# Patient Record
Sex: Female | Born: 1965
Health system: Southern US, Community
[De-identification: ages and names within clinical notes are randomized; demographics above are authoritative.]

## PROBLEM LIST (undated history)

## (undated) DIAGNOSIS — I10 Essential (primary) hypertension: Secondary | ICD-10-CM

## (undated) DIAGNOSIS — E785 Hyperlipidemia, unspecified: Secondary | ICD-10-CM

## (undated) DIAGNOSIS — R7301 Impaired fasting glucose: Secondary | ICD-10-CM

## (undated) DIAGNOSIS — F17200 Nicotine dependence, unspecified, uncomplicated: Secondary | ICD-10-CM

## (undated) DIAGNOSIS — D219 Benign neoplasm of connective and other soft tissue, unspecified: Secondary | ICD-10-CM

## (undated) DIAGNOSIS — Z803 Family history of malignant neoplasm of breast: Secondary | ICD-10-CM

## (undated) DIAGNOSIS — G43909 Migraine, unspecified, not intractable, without status migrainosus: Secondary | ICD-10-CM

## (undated) DIAGNOSIS — G5601 Carpal tunnel syndrome, right upper limb: Secondary | ICD-10-CM

## (undated) DIAGNOSIS — E669 Obesity, unspecified: Secondary | ICD-10-CM

## (undated) DIAGNOSIS — A6000 Herpesviral infection of urogenital system, unspecified: Secondary | ICD-10-CM

## (undated) DIAGNOSIS — K219 Gastro-esophageal reflux disease without esophagitis: Secondary | ICD-10-CM

## (undated) HISTORY — PX: LAPAROSCOPIC ABDOMINAL EXPLORATION: SHX6249

## (undated) HISTORY — PX: CARDIAC CATHETERIZATION: SHX172

## (undated) HISTORY — DX: Impaired fasting glucose: R73.01

## (undated) HISTORY — PX: CYST EXCISION: SHX5701

## (undated) HISTORY — DX: Obesity, unspecified: E66.9

## (undated) HISTORY — PX: DILATION AND CURETTAGE OF UTERUS: SHX78

## (undated) HISTORY — PX: TUBAL LIGATION: SHX77

## (undated) HISTORY — DX: Nicotine dependence, unspecified, uncomplicated: F17.200

## (undated) HISTORY — DX: Family history of malignant neoplasm of breast: Z80.3

## (undated) HISTORY — DX: Herpesviral infection of urogenital system, unspecified: A60.00

---

## 1998-05-05 ENCOUNTER — Emergency Department (HOSPITAL_COMMUNITY): Admission: EM | Admit: 1998-05-05 | Discharge: 1998-05-05 | Payer: Self-pay | Admitting: Emergency Medicine

## 1999-02-21 ENCOUNTER — Ambulatory Visit (HOSPITAL_COMMUNITY): Admission: RE | Admit: 1999-02-21 | Discharge: 1999-02-21 | Payer: Self-pay | Admitting: Internal Medicine

## 1999-02-21 ENCOUNTER — Encounter: Payer: Self-pay | Admitting: Internal Medicine

## 1999-05-17 ENCOUNTER — Emergency Department (HOSPITAL_COMMUNITY): Admission: EM | Admit: 1999-05-17 | Discharge: 1999-05-17 | Payer: Self-pay | Admitting: Emergency Medicine

## 1999-11-12 ENCOUNTER — Other Ambulatory Visit: Admission: RE | Admit: 1999-11-12 | Discharge: 1999-11-12 | Payer: Self-pay | Admitting: Internal Medicine

## 2000-01-05 ENCOUNTER — Encounter: Payer: Self-pay | Admitting: Emergency Medicine

## 2000-01-05 ENCOUNTER — Emergency Department (HOSPITAL_COMMUNITY): Admission: EM | Admit: 2000-01-05 | Discharge: 2000-01-05 | Payer: Self-pay | Admitting: Emergency Medicine

## 2000-02-29 ENCOUNTER — Encounter: Payer: Self-pay | Admitting: Family Medicine

## 2000-02-29 ENCOUNTER — Ambulatory Visit (HOSPITAL_COMMUNITY): Admission: RE | Admit: 2000-02-29 | Discharge: 2000-02-29 | Payer: Self-pay | Admitting: Family Medicine

## 2000-07-30 ENCOUNTER — Encounter: Payer: Self-pay | Admitting: Emergency Medicine

## 2000-07-30 ENCOUNTER — Emergency Department (HOSPITAL_COMMUNITY): Admission: EM | Admit: 2000-07-30 | Discharge: 2000-07-30 | Payer: Self-pay | Admitting: Emergency Medicine

## 2000-08-01 ENCOUNTER — Ambulatory Visit: Admission: RE | Admit: 2000-08-01 | Discharge: 2000-08-01 | Payer: Self-pay

## 2001-02-17 ENCOUNTER — Encounter: Payer: Self-pay | Admitting: Emergency Medicine

## 2001-02-17 ENCOUNTER — Emergency Department (HOSPITAL_COMMUNITY): Admission: EM | Admit: 2001-02-17 | Discharge: 2001-02-17 | Payer: Self-pay | Admitting: Emergency Medicine

## 2001-08-03 ENCOUNTER — Other Ambulatory Visit: Admission: RE | Admit: 2001-08-03 | Discharge: 2001-08-03 | Payer: Self-pay | Admitting: Family Medicine

## 2001-08-30 ENCOUNTER — Emergency Department (HOSPITAL_COMMUNITY): Admission: EM | Admit: 2001-08-30 | Discharge: 2001-08-30 | Payer: Self-pay | Admitting: *Deleted

## 2001-10-06 ENCOUNTER — Emergency Department (HOSPITAL_COMMUNITY): Admission: EM | Admit: 2001-10-06 | Discharge: 2001-10-06 | Payer: Self-pay | Admitting: Emergency Medicine

## 2001-10-23 ENCOUNTER — Ambulatory Visit (HOSPITAL_COMMUNITY): Admission: RE | Admit: 2001-10-23 | Discharge: 2001-10-23 | Payer: Self-pay | Admitting: Family Medicine

## 2002-04-15 ENCOUNTER — Encounter: Payer: Self-pay | Admitting: Emergency Medicine

## 2002-04-15 ENCOUNTER — Emergency Department (HOSPITAL_COMMUNITY): Admission: EM | Admit: 2002-04-15 | Discharge: 2002-04-15 | Payer: Self-pay | Admitting: Emergency Medicine

## 2002-11-26 ENCOUNTER — Emergency Department (HOSPITAL_COMMUNITY): Admission: EM | Admit: 2002-11-26 | Discharge: 2002-11-26 | Payer: Self-pay

## 2002-12-10 ENCOUNTER — Emergency Department (HOSPITAL_COMMUNITY): Admission: EM | Admit: 2002-12-10 | Discharge: 2002-12-10 | Payer: Self-pay | Admitting: Emergency Medicine

## 2003-03-21 ENCOUNTER — Encounter: Payer: Self-pay | Admitting: Emergency Medicine

## 2003-03-21 ENCOUNTER — Emergency Department (HOSPITAL_COMMUNITY): Admission: EM | Admit: 2003-03-21 | Discharge: 2003-03-21 | Payer: Self-pay | Admitting: Emergency Medicine

## 2003-11-22 ENCOUNTER — Emergency Department (HOSPITAL_COMMUNITY): Admission: EM | Admit: 2003-11-22 | Discharge: 2003-11-22 | Payer: Self-pay | Admitting: Emergency Medicine

## 2004-04-03 ENCOUNTER — Ambulatory Visit (HOSPITAL_COMMUNITY): Admission: RE | Admit: 2004-04-03 | Discharge: 2004-04-03 | Payer: Self-pay | Admitting: Internal Medicine

## 2004-04-13 ENCOUNTER — Ambulatory Visit (HOSPITAL_COMMUNITY): Admission: RE | Admit: 2004-04-13 | Discharge: 2004-04-13 | Payer: Self-pay | Admitting: Family Medicine

## 2004-04-20 ENCOUNTER — Emergency Department (HOSPITAL_COMMUNITY): Admission: EM | Admit: 2004-04-20 | Discharge: 2004-04-20 | Payer: Self-pay | Admitting: Emergency Medicine

## 2004-06-20 ENCOUNTER — Emergency Department (HOSPITAL_COMMUNITY): Admission: EM | Admit: 2004-06-20 | Discharge: 2004-06-20 | Payer: Self-pay | Admitting: Family Medicine

## 2004-06-21 ENCOUNTER — Emergency Department (HOSPITAL_COMMUNITY): Admission: EM | Admit: 2004-06-21 | Discharge: 2004-06-21 | Payer: Self-pay | Admitting: Emergency Medicine

## 2004-08-03 ENCOUNTER — Ambulatory Visit: Payer: Self-pay | Admitting: *Deleted

## 2004-08-16 ENCOUNTER — Ambulatory Visit: Payer: Self-pay | Admitting: Nurse Practitioner

## 2004-08-30 ENCOUNTER — Ambulatory Visit: Payer: Self-pay | Admitting: Nurse Practitioner

## 2004-09-12 ENCOUNTER — Ambulatory Visit: Payer: Self-pay | Admitting: Nurse Practitioner

## 2004-11-15 ENCOUNTER — Ambulatory Visit: Payer: Self-pay | Admitting: Nurse Practitioner

## 2004-12-27 ENCOUNTER — Ambulatory Visit: Payer: Self-pay | Admitting: Family Medicine

## 2004-12-31 ENCOUNTER — Ambulatory Visit: Payer: Self-pay | Admitting: Nurse Practitioner

## 2005-02-20 ENCOUNTER — Ambulatory Visit: Payer: Self-pay | Admitting: Nurse Practitioner

## 2005-03-14 ENCOUNTER — Emergency Department (HOSPITAL_COMMUNITY): Admission: EM | Admit: 2005-03-14 | Discharge: 2005-03-14 | Payer: Self-pay | Admitting: Emergency Medicine

## 2005-03-15 ENCOUNTER — Emergency Department (HOSPITAL_COMMUNITY): Admission: EM | Admit: 2005-03-15 | Discharge: 2005-03-15 | Payer: Self-pay | Admitting: Emergency Medicine

## 2005-03-18 ENCOUNTER — Ambulatory Visit: Payer: Self-pay | Admitting: Nurse Practitioner

## 2005-04-01 ENCOUNTER — Ambulatory Visit: Payer: Self-pay | Admitting: Nurse Practitioner

## 2005-05-26 ENCOUNTER — Emergency Department (HOSPITAL_COMMUNITY): Admission: EM | Admit: 2005-05-26 | Discharge: 2005-05-26 | Payer: Self-pay | Admitting: Family Medicine

## 2005-06-14 ENCOUNTER — Ambulatory Visit: Payer: Self-pay | Admitting: Nurse Practitioner

## 2005-06-17 ENCOUNTER — Ambulatory Visit: Payer: Self-pay | Admitting: Internal Medicine

## 2005-07-09 ENCOUNTER — Ambulatory Visit: Payer: Self-pay | Admitting: Nurse Practitioner

## 2005-07-18 ENCOUNTER — Encounter: Admission: RE | Admit: 2005-07-18 | Discharge: 2005-07-18 | Payer: Self-pay | Admitting: Internal Medicine

## 2005-07-22 ENCOUNTER — Emergency Department (HOSPITAL_COMMUNITY): Admission: EM | Admit: 2005-07-22 | Discharge: 2005-07-23 | Payer: Self-pay | Admitting: Emergency Medicine

## 2005-07-23 ENCOUNTER — Ambulatory Visit: Payer: Self-pay | Admitting: Nurse Practitioner

## 2005-07-23 ENCOUNTER — Ambulatory Visit (HOSPITAL_COMMUNITY): Admission: RE | Admit: 2005-07-23 | Discharge: 2005-07-23 | Payer: Self-pay | Admitting: Emergency Medicine

## 2005-07-29 ENCOUNTER — Ambulatory Visit: Payer: Self-pay | Admitting: Nurse Practitioner

## 2005-08-19 ENCOUNTER — Emergency Department (HOSPITAL_COMMUNITY): Admission: EM | Admit: 2005-08-19 | Discharge: 2005-08-19 | Payer: Self-pay | Admitting: Emergency Medicine

## 2005-09-02 ENCOUNTER — Ambulatory Visit: Payer: Self-pay | Admitting: Nurse Practitioner

## 2005-09-08 ENCOUNTER — Emergency Department (HOSPITAL_COMMUNITY): Admission: EM | Admit: 2005-09-08 | Discharge: 2005-09-08 | Payer: Self-pay | Admitting: Family Medicine

## 2005-09-15 ENCOUNTER — Emergency Department (HOSPITAL_COMMUNITY): Admission: EM | Admit: 2005-09-15 | Discharge: 2005-09-15 | Payer: Self-pay | Admitting: Emergency Medicine

## 2005-11-05 ENCOUNTER — Emergency Department (HOSPITAL_COMMUNITY): Admission: EM | Admit: 2005-11-05 | Discharge: 2005-11-05 | Payer: Self-pay | Admitting: Emergency Medicine

## 2005-11-06 ENCOUNTER — Ambulatory Visit: Payer: Self-pay | Admitting: Nurse Practitioner

## 2005-11-08 ENCOUNTER — Ambulatory Visit: Payer: Self-pay | Admitting: Nurse Practitioner

## 2005-11-20 ENCOUNTER — Emergency Department (HOSPITAL_COMMUNITY): Admission: EM | Admit: 2005-11-20 | Discharge: 2005-11-20 | Payer: Self-pay | Admitting: Family Medicine

## 2005-12-18 ENCOUNTER — Ambulatory Visit: Payer: Self-pay | Admitting: Nurse Practitioner

## 2005-12-31 ENCOUNTER — Ambulatory Visit: Payer: Self-pay | Admitting: Nurse Practitioner

## 2006-01-15 ENCOUNTER — Emergency Department (HOSPITAL_COMMUNITY): Admission: EM | Admit: 2006-01-15 | Discharge: 2006-01-15 | Payer: Self-pay | Admitting: Family Medicine

## 2006-02-13 ENCOUNTER — Ambulatory Visit: Payer: Self-pay | Admitting: Nurse Practitioner

## 2006-03-21 ENCOUNTER — Ambulatory Visit: Payer: Self-pay | Admitting: Nurse Practitioner

## 2006-03-25 ENCOUNTER — Ambulatory Visit (HOSPITAL_COMMUNITY): Admission: RE | Admit: 2006-03-25 | Discharge: 2006-03-25 | Payer: Self-pay | Admitting: Internal Medicine

## 2006-03-31 ENCOUNTER — Ambulatory Visit: Payer: Self-pay | Admitting: Nurse Practitioner

## 2006-04-09 ENCOUNTER — Emergency Department (HOSPITAL_COMMUNITY): Admission: EM | Admit: 2006-04-09 | Discharge: 2006-04-09 | Payer: Self-pay | Admitting: Emergency Medicine

## 2006-04-25 ENCOUNTER — Ambulatory Visit: Payer: Self-pay | Admitting: Family Medicine

## 2006-05-14 ENCOUNTER — Ambulatory Visit: Payer: Self-pay | Admitting: Nurse Practitioner

## 2006-05-16 ENCOUNTER — Ambulatory Visit: Payer: Self-pay | Admitting: Nurse Practitioner

## 2006-06-13 ENCOUNTER — Ambulatory Visit (HOSPITAL_BASED_OUTPATIENT_CLINIC_OR_DEPARTMENT_OTHER): Admission: RE | Admit: 2006-06-13 | Discharge: 2006-06-14 | Payer: Self-pay | Admitting: Otolaryngology

## 2006-06-13 ENCOUNTER — Encounter (INDEPENDENT_AMBULATORY_CARE_PROVIDER_SITE_OTHER): Payer: Self-pay | Admitting: *Deleted

## 2006-07-31 ENCOUNTER — Ambulatory Visit: Payer: Self-pay | Admitting: Nurse Practitioner

## 2006-08-08 ENCOUNTER — Ambulatory Visit: Payer: Self-pay | Admitting: Nurse Practitioner

## 2006-08-14 ENCOUNTER — Encounter: Admission: RE | Admit: 2006-08-14 | Discharge: 2006-08-14 | Payer: Self-pay | Admitting: Nurse Practitioner

## 2006-08-18 ENCOUNTER — Ambulatory Visit: Payer: Self-pay | Admitting: Nurse Practitioner

## 2006-10-17 ENCOUNTER — Ambulatory Visit: Payer: Self-pay | Admitting: Nurse Practitioner

## 2006-10-30 ENCOUNTER — Ambulatory Visit: Payer: Self-pay | Admitting: Nurse Practitioner

## 2006-10-31 ENCOUNTER — Emergency Department (HOSPITAL_COMMUNITY): Admission: EM | Admit: 2006-10-31 | Discharge: 2006-10-31 | Payer: Self-pay | Admitting: Emergency Medicine

## 2006-11-17 ENCOUNTER — Emergency Department (HOSPITAL_COMMUNITY): Admission: EM | Admit: 2006-11-17 | Discharge: 2006-11-17 | Payer: Self-pay | Admitting: Emergency Medicine

## 2006-12-09 ENCOUNTER — Emergency Department (HOSPITAL_COMMUNITY): Admission: EM | Admit: 2006-12-09 | Discharge: 2006-12-09 | Payer: Self-pay | Admitting: Emergency Medicine

## 2006-12-10 ENCOUNTER — Emergency Department (HOSPITAL_COMMUNITY): Admission: EM | Admit: 2006-12-10 | Discharge: 2006-12-10 | Payer: Self-pay | Admitting: Emergency Medicine

## 2006-12-13 ENCOUNTER — Emergency Department (HOSPITAL_COMMUNITY): Admission: EM | Admit: 2006-12-13 | Discharge: 2006-12-13 | Payer: Self-pay | Admitting: Family Medicine

## 2006-12-15 ENCOUNTER — Ambulatory Visit: Payer: Self-pay | Admitting: Internal Medicine

## 2007-01-01 ENCOUNTER — Ambulatory Visit: Payer: Self-pay | Admitting: Nurse Practitioner

## 2007-03-03 ENCOUNTER — Ambulatory Visit: Payer: Self-pay | Admitting: Nurse Practitioner

## 2007-03-14 ENCOUNTER — Emergency Department (HOSPITAL_COMMUNITY): Admission: EM | Admit: 2007-03-14 | Discharge: 2007-03-14 | Payer: Self-pay | Admitting: Emergency Medicine

## 2007-04-22 ENCOUNTER — Emergency Department (HOSPITAL_COMMUNITY): Admission: EM | Admit: 2007-04-22 | Discharge: 2007-04-22 | Payer: Self-pay | Admitting: Emergency Medicine

## 2007-05-11 ENCOUNTER — Ambulatory Visit: Payer: Self-pay | Admitting: Family Medicine

## 2007-05-13 ENCOUNTER — Ambulatory Visit: Payer: Self-pay | Admitting: Internal Medicine

## 2007-07-15 ENCOUNTER — Encounter (INDEPENDENT_AMBULATORY_CARE_PROVIDER_SITE_OTHER): Payer: Self-pay | Admitting: *Deleted

## 2007-09-14 ENCOUNTER — Encounter (INDEPENDENT_AMBULATORY_CARE_PROVIDER_SITE_OTHER): Payer: Self-pay | Admitting: Nurse Practitioner

## 2007-09-14 ENCOUNTER — Ambulatory Visit: Payer: Self-pay | Admitting: Family Medicine

## 2007-09-14 ENCOUNTER — Encounter: Admission: RE | Admit: 2007-09-14 | Discharge: 2007-09-14 | Payer: Self-pay | Admitting: Nurse Practitioner

## 2007-09-14 LAB — CONVERTED CEMR LAB
ALT: 13 units/L (ref 0–35)
AST: 14 units/L (ref 0–37)
Albumin: 4.1 g/dL (ref 3.5–5.2)
Alkaline Phosphatase: 90 units/L (ref 39–117)
BUN: 10 mg/dL (ref 6–23)
Basophils Absolute: 0 10*3/uL (ref 0.0–0.1)
Basophils Relative: 0 % (ref 0–1)
CO2: 25 meq/L (ref 19–32)
Calcium: 9.5 mg/dL (ref 8.4–10.5)
Chloride: 99 meq/L (ref 96–112)
Creatinine, Ser: 0.87 mg/dL (ref 0.40–1.20)
Eosinophils Absolute: 0.4 10*3/uL (ref 0.2–0.7)
Eosinophils Relative: 5 % (ref 0–5)
Glucose, Bld: 86 mg/dL (ref 70–99)
HCT: 34.1 % — ABNORMAL LOW (ref 36.0–46.0)
Hemoglobin: 10.6 g/dL — ABNORMAL LOW (ref 12.0–15.0)
Lymphocytes Relative: 27 % (ref 12–46)
Lymphs Abs: 2.1 10*3/uL (ref 0.7–4.0)
MCHC: 31.1 g/dL (ref 30.0–36.0)
MCV: 82.2 fL (ref 78.0–100.0)
Monocytes Absolute: 0.7 10*3/uL (ref 0.1–1.0)
Monocytes Relative: 9 % (ref 3–12)
Neutro Abs: 4.6 10*3/uL (ref 1.7–7.7)
Neutrophils Relative %: 59 % (ref 43–77)
Platelets: 389 10*3/uL (ref 150–400)
Potassium: 3.8 meq/L (ref 3.5–5.3)
RBC: 4.15 M/uL (ref 3.87–5.11)
RDW: 17.6 % — ABNORMAL HIGH (ref 11.5–15.5)
Sodium: 139 meq/L (ref 135–145)
TSH: 1.413 microintl units/mL (ref 0.350–5.50)
Total Bilirubin: 0.3 mg/dL (ref 0.3–1.2)
Total Protein: 7.4 g/dL (ref 6.0–8.3)
WBC: 7.9 10*3/uL (ref 4.0–10.5)

## 2007-09-28 ENCOUNTER — Ambulatory Visit: Payer: Self-pay | Admitting: Family Medicine

## 2007-09-28 ENCOUNTER — Encounter (INDEPENDENT_AMBULATORY_CARE_PROVIDER_SITE_OTHER): Payer: Self-pay | Admitting: Nurse Practitioner

## 2007-09-28 LAB — CONVERTED CEMR LAB
Cholesterol: 148 mg/dL (ref 0–200)
HDL: 34 mg/dL — ABNORMAL LOW (ref >39)
LDL Cholesterol: 56 mg/dL (ref 0–99)
Total CHOL/HDL Ratio: 4.4
Triglycerides: 292 mg/dL — ABNORMAL HIGH (ref <150)
VLDL: 58 mg/dL — ABNORMAL HIGH (ref 0–40)

## 2007-10-17 ENCOUNTER — Emergency Department (HOSPITAL_COMMUNITY): Admission: EM | Admit: 2007-10-17 | Discharge: 2007-10-17 | Payer: Self-pay | Admitting: Emergency Medicine

## 2007-10-26 ENCOUNTER — Ambulatory Visit: Payer: Self-pay | Admitting: Family Medicine

## 2007-12-08 ENCOUNTER — Ambulatory Visit: Payer: Self-pay | Admitting: Internal Medicine

## 2008-01-22 ENCOUNTER — Encounter (INDEPENDENT_AMBULATORY_CARE_PROVIDER_SITE_OTHER): Payer: Self-pay | Admitting: Nurse Practitioner

## 2008-01-22 ENCOUNTER — Ambulatory Visit: Payer: Self-pay | Admitting: Internal Medicine

## 2008-01-22 LAB — CONVERTED CEMR LAB
ALT: 12 units/L (ref 0–35)
AST: 16 units/L (ref 0–37)
Albumin: 4.2 g/dL (ref 3.5–5.2)
Alkaline Phosphatase: 85 units/L (ref 39–117)
BUN: 8 mg/dL (ref 6–23)
CO2: 26 meq/L (ref 19–32)
Calcium: 9 mg/dL (ref 8.4–10.5)
Chloride: 102 meq/L (ref 96–112)
Cholesterol: 139 mg/dL (ref 0–200)
Creatinine, Ser: 0.6 mg/dL (ref 0.40–1.20)
Glucose, Bld: 71 mg/dL (ref 70–99)
HDL: 35 mg/dL — ABNORMAL LOW (ref >39)
LDL Cholesterol: 74 mg/dL (ref 0–99)
Potassium: 3.8 meq/L (ref 3.5–5.3)
Sodium: 142 meq/L (ref 135–145)
Total Bilirubin: 0.3 mg/dL (ref 0.3–1.2)
Total CHOL/HDL Ratio: 4
Total Protein: 7.2 g/dL (ref 6.0–8.3)
Triglycerides: 150 mg/dL — ABNORMAL HIGH (ref <150)
VLDL: 30 mg/dL (ref 0–40)

## 2008-03-29 ENCOUNTER — Ambulatory Visit: Payer: Self-pay | Admitting: Internal Medicine

## 2008-03-31 ENCOUNTER — Ambulatory Visit: Payer: Self-pay | Admitting: Nurse Practitioner

## 2008-04-11 ENCOUNTER — Ambulatory Visit: Payer: Self-pay | Admitting: Internal Medicine

## 2008-06-06 ENCOUNTER — Ambulatory Visit: Payer: Self-pay | Admitting: Internal Medicine

## 2008-06-06 LAB — CONVERTED CEMR LAB
BUN: 11 mg/dL (ref 6–23)
Basophils Absolute: 0 10*3/uL (ref 0.0–0.1)
Basophils Relative: 0 % (ref 0–1)
CO2: 27 meq/L (ref 19–32)
Calcium: 9.2 mg/dL (ref 8.4–10.5)
Chloride: 102 meq/L (ref 96–112)
Creatinine, Ser: 0.77 mg/dL (ref 0.40–1.20)
Eosinophils Absolute: 0.4 10*3/uL (ref 0.0–0.7)
Eosinophils Relative: 5 % (ref 0–5)
Glucose, Bld: 94 mg/dL (ref 70–99)
HCT: 33.4 % — ABNORMAL LOW (ref 36.0–46.0)
Hemoglobin: 10 g/dL — ABNORMAL LOW (ref 12.0–15.0)
Lymphocytes Relative: 32 % (ref 12–46)
Lymphs Abs: 2.4 10*3/uL (ref 0.7–4.0)
MCHC: 29.9 g/dL — ABNORMAL LOW (ref 30.0–36.0)
MCV: 73.9 fL — ABNORMAL LOW (ref 78.0–100.0)
Monocytes Absolute: 0.7 10*3/uL (ref 0.1–1.0)
Monocytes Relative: 10 % (ref 3–12)
Neutro Abs: 4 10*3/uL (ref 1.7–7.7)
Neutrophils Relative %: 53 % (ref 43–77)
Platelets: 437 10*3/uL — ABNORMAL HIGH (ref 150–400)
Potassium: 3.7 meq/L (ref 3.5–5.3)
RBC: 4.52 M/uL (ref 3.87–5.11)
RDW: 17.7 % — ABNORMAL HIGH (ref 11.5–15.5)
Sodium: 141 meq/L (ref 135–145)
WBC: 7.5 10*3/uL (ref 4.0–10.5)

## 2008-07-25 ENCOUNTER — Ambulatory Visit: Payer: Self-pay | Admitting: Internal Medicine

## 2008-07-25 LAB — CONVERTED CEMR LAB
ALT: 11 units/L (ref 0–35)
AST: 15 units/L (ref 0–37)
Albumin: 4.4 g/dL (ref 3.5–5.2)
Alkaline Phosphatase: 86 units/L (ref 39–117)
BUN: 13 mg/dL (ref 6–23)
Basophils Absolute: 0 10*3/uL (ref 0.0–0.1)
Basophils Relative: 1 % (ref 0–1)
CO2: 22 meq/L (ref 19–32)
Calcium: 9.1 mg/dL (ref 8.4–10.5)
Chloride: 101 meq/L (ref 96–112)
Cholesterol: 146 mg/dL (ref 0–200)
Creatinine, Ser: 0.58 mg/dL (ref 0.40–1.20)
Eosinophils Absolute: 0.3 10*3/uL (ref 0.0–0.7)
Eosinophils Relative: 5 % (ref 0–5)
Glucose, Bld: 97 mg/dL (ref 70–99)
HCT: 39.9 % (ref 36.0–46.0)
HDL: 31 mg/dL — ABNORMAL LOW (ref >39)
Hemoglobin: 11.8 g/dL — ABNORMAL LOW (ref 12.0–15.0)
LDL Cholesterol: 88 mg/dL (ref 0–99)
Lymphocytes Relative: 34 % (ref 12–46)
Lymphs Abs: 1.6 10*3/uL (ref 0.7–4.0)
MCHC: 29.6 g/dL — ABNORMAL LOW (ref 30.0–36.0)
MCV: 81.8 fL (ref 78.0–100.0)
Monocytes Absolute: 0.6 10*3/uL (ref 0.1–1.0)
Monocytes Relative: 13 % — ABNORMAL HIGH (ref 3–12)
Neutro Abs: 2.3 10*3/uL (ref 1.7–7.7)
Neutrophils Relative %: 48 % (ref 43–77)
Platelets: 309 10*3/uL (ref 150–400)
Potassium: 3.7 meq/L (ref 3.5–5.3)
RBC: 4.88 M/uL (ref 3.87–5.11)
RDW: 27.2 % — ABNORMAL HIGH (ref 11.5–15.5)
Sodium: 140 meq/L (ref 135–145)
TSH: 1.589 microintl units/mL (ref 0.350–4.50)
Total Bilirubin: 0.3 mg/dL (ref 0.3–1.2)
Total CHOL/HDL Ratio: 4.7
Total Protein: 7.8 g/dL (ref 6.0–8.3)
Triglycerides: 134 mg/dL (ref <150)
VLDL: 27 mg/dL (ref 0–40)
WBC: 4.8 10*3/uL (ref 4.0–10.5)

## 2008-09-01 ENCOUNTER — Ambulatory Visit: Payer: Self-pay | Admitting: Internal Medicine

## 2008-09-12 ENCOUNTER — Ambulatory Visit: Payer: Self-pay | Admitting: Internal Medicine

## 2008-09-19 ENCOUNTER — Ambulatory Visit (HOSPITAL_COMMUNITY): Admission: RE | Admit: 2008-09-19 | Discharge: 2008-09-19 | Payer: Self-pay | Admitting: Family Medicine

## 2008-09-26 ENCOUNTER — Ambulatory Visit: Payer: Self-pay | Admitting: Family Medicine

## 2008-10-01 ENCOUNTER — Emergency Department (HOSPITAL_COMMUNITY): Admission: EM | Admit: 2008-10-01 | Discharge: 2008-10-01 | Payer: Self-pay | Admitting: Emergency Medicine

## 2008-10-28 DIAGNOSIS — D219 Benign neoplasm of connective and other soft tissue, unspecified: Secondary | ICD-10-CM

## 2008-10-28 HISTORY — DX: Benign neoplasm of connective and other soft tissue, unspecified: D21.9

## 2008-10-31 ENCOUNTER — Ambulatory Visit: Payer: Self-pay | Admitting: Internal Medicine

## 2008-10-31 ENCOUNTER — Encounter (INDEPENDENT_AMBULATORY_CARE_PROVIDER_SITE_OTHER): Payer: Self-pay | Admitting: Internal Medicine

## 2008-10-31 LAB — CONVERTED CEMR LAB
Basophils Absolute: 0 10*3/uL (ref 0.0–0.1)
Basophils Relative: 0 % (ref 0–1)
Eosinophils Absolute: 0.2 10*3/uL (ref 0.0–0.7)
Eosinophils Relative: 5 % (ref 0–5)
HCT: 41 % (ref 36.0–46.0)
Hemoglobin: 12.9 g/dL (ref 12.0–15.0)
Lymphocytes Relative: 39 % (ref 12–46)
Lymphs Abs: 1.8 10*3/uL (ref 0.7–4.0)
MCHC: 31.5 g/dL (ref 30.0–36.0)
MCV: 88.2 fL (ref 78.0–100.0)
Monocytes Absolute: 0.5 10*3/uL (ref 0.1–1.0)
Monocytes Relative: 10 % (ref 3–12)
Neutro Abs: 2.1 10*3/uL (ref 1.7–7.7)
Neutrophils Relative %: 46 % (ref 43–77)
Platelets: 333 10*3/uL (ref 150–400)
RBC: 4.65 M/uL (ref 3.87–5.11)
RDW: 15.6 % — ABNORMAL HIGH (ref 11.5–15.5)
WBC: 4.7 10*3/uL (ref 4.0–10.5)

## 2008-11-14 ENCOUNTER — Ambulatory Visit: Payer: Self-pay | Admitting: Internal Medicine

## 2008-11-17 ENCOUNTER — Encounter: Admission: RE | Admit: 2008-11-17 | Discharge: 2008-12-30 | Payer: Self-pay | Admitting: Family Medicine

## 2008-12-05 ENCOUNTER — Ambulatory Visit: Payer: Self-pay | Admitting: Internal Medicine

## 2008-12-22 ENCOUNTER — Ambulatory Visit: Payer: Self-pay | Admitting: Internal Medicine

## 2009-01-09 ENCOUNTER — Encounter (INDEPENDENT_AMBULATORY_CARE_PROVIDER_SITE_OTHER): Payer: Self-pay | Admitting: Internal Medicine

## 2009-01-09 ENCOUNTER — Ambulatory Visit: Payer: Self-pay | Admitting: Internal Medicine

## 2009-01-09 LAB — CONVERTED CEMR LAB
Cholesterol: 143 mg/dL (ref 0–200)
HDL: 31 mg/dL — ABNORMAL LOW (ref >39)
LDL Cholesterol: 95 mg/dL (ref 0–99)
Total CHOL/HDL Ratio: 4.6
Triglycerides: 85 mg/dL (ref <150)
VLDL: 17 mg/dL (ref 0–40)

## 2009-02-13 ENCOUNTER — Ambulatory Visit: Payer: Self-pay | Admitting: Family Medicine

## 2009-03-02 ENCOUNTER — Ambulatory Visit: Payer: Self-pay | Admitting: Family Medicine

## 2009-04-03 ENCOUNTER — Emergency Department (HOSPITAL_COMMUNITY): Admission: EM | Admit: 2009-04-03 | Discharge: 2009-04-03 | Payer: Self-pay | Admitting: Emergency Medicine

## 2009-04-17 ENCOUNTER — Ambulatory Visit: Payer: Self-pay | Admitting: Internal Medicine

## 2009-04-19 ENCOUNTER — Ambulatory Visit: Payer: Self-pay | Admitting: Internal Medicine

## 2009-05-18 ENCOUNTER — Ambulatory Visit: Payer: Self-pay | Admitting: Internal Medicine

## 2009-06-13 ENCOUNTER — Ambulatory Visit: Payer: Self-pay | Admitting: Internal Medicine

## 2009-06-22 ENCOUNTER — Emergency Department (HOSPITAL_COMMUNITY): Admission: EM | Admit: 2009-06-22 | Discharge: 2009-06-23 | Payer: Self-pay | Admitting: Emergency Medicine

## 2009-08-09 ENCOUNTER — Emergency Department (HOSPITAL_COMMUNITY): Admission: EM | Admit: 2009-08-09 | Discharge: 2009-08-09 | Payer: Self-pay | Admitting: Family Medicine

## 2009-08-25 ENCOUNTER — Telehealth (INDEPENDENT_AMBULATORY_CARE_PROVIDER_SITE_OTHER): Payer: Self-pay | Admitting: *Deleted

## 2009-08-31 ENCOUNTER — Ambulatory Visit: Payer: Self-pay | Admitting: Family Medicine

## 2009-09-01 ENCOUNTER — Emergency Department (HOSPITAL_COMMUNITY): Admission: EM | Admit: 2009-09-01 | Discharge: 2009-09-01 | Payer: Self-pay | Admitting: Emergency Medicine

## 2009-09-02 ENCOUNTER — Emergency Department (HOSPITAL_COMMUNITY): Admission: EM | Admit: 2009-09-02 | Discharge: 2009-09-02 | Payer: Self-pay | Admitting: Emergency Medicine

## 2009-10-06 ENCOUNTER — Encounter: Admission: RE | Admit: 2009-10-06 | Discharge: 2009-10-06 | Payer: Self-pay | Admitting: Family Medicine

## 2009-10-08 ENCOUNTER — Emergency Department (HOSPITAL_COMMUNITY): Admission: EM | Admit: 2009-10-08 | Discharge: 2009-10-08 | Payer: Self-pay | Admitting: Emergency Medicine

## 2009-10-15 ENCOUNTER — Emergency Department (HOSPITAL_COMMUNITY): Admission: EM | Admit: 2009-10-15 | Discharge: 2009-10-15 | Payer: Self-pay | Admitting: Emergency Medicine

## 2009-11-10 ENCOUNTER — Emergency Department (HOSPITAL_COMMUNITY): Admission: EM | Admit: 2009-11-10 | Discharge: 2009-11-10 | Payer: Self-pay | Admitting: Family Medicine

## 2009-12-07 ENCOUNTER — Emergency Department (HOSPITAL_COMMUNITY): Admission: EM | Admit: 2009-12-07 | Discharge: 2009-12-07 | Payer: Self-pay | Admitting: Emergency Medicine

## 2009-12-09 ENCOUNTER — Emergency Department (HOSPITAL_COMMUNITY): Admission: EM | Admit: 2009-12-09 | Discharge: 2009-12-09 | Payer: Self-pay | Admitting: Emergency Medicine

## 2009-12-20 ENCOUNTER — Ambulatory Visit: Payer: Self-pay | Admitting: Internal Medicine

## 2009-12-20 LAB — CONVERTED CEMR LAB
BUN: 6 mg/dL (ref 6–23)
CO2: 24 meq/L (ref 19–32)
CRP: 3.5 mg/dL — ABNORMAL HIGH (ref <0.6)
Calcium: 9.2 mg/dL (ref 8.4–10.5)
Chloride: 100 meq/L (ref 96–112)
Creatinine, Ser: 0.57 mg/dL (ref 0.40–1.20)
Glucose, Bld: 80 mg/dL (ref 70–99)
Hgb A1c MFr Bld: 6.9 % — ABNORMAL HIGH (ref 4.6–6.1)
Iron: 25 ug/dL — ABNORMAL LOW (ref 42–145)
Potassium: 3.9 meq/L (ref 3.5–5.3)
Saturation Ratios: 8 % — ABNORMAL LOW (ref 20–55)
Sodium: 139 meq/L (ref 135–145)
TIBC: 320 ug/dL (ref 250–470)
UIBC: 295 ug/dL

## 2009-12-22 ENCOUNTER — Emergency Department (HOSPITAL_COMMUNITY): Admission: EM | Admit: 2009-12-22 | Discharge: 2009-12-22 | Payer: Self-pay | Admitting: Emergency Medicine

## 2010-01-18 ENCOUNTER — Ambulatory Visit: Payer: Self-pay | Admitting: Internal Medicine

## 2010-02-27 ENCOUNTER — Emergency Department (HOSPITAL_COMMUNITY): Admission: EM | Admit: 2010-02-27 | Discharge: 2010-02-27 | Payer: Self-pay | Admitting: Family Medicine

## 2010-04-11 ENCOUNTER — Ambulatory Visit: Payer: Self-pay | Admitting: Internal Medicine

## 2010-04-11 LAB — CONVERTED CEMR LAB
ALT: 10 units/L (ref 0–35)
AST: 14 units/L (ref 0–37)
Albumin: 4 g/dL (ref 3.5–5.2)
Alkaline Phosphatase: 82 units/L (ref 39–117)
BUN: 8 mg/dL (ref 6–23)
Basophils Absolute: 0 10*3/uL (ref 0.0–0.1)
Basophils Relative: 0 % (ref 0–1)
CO2: 25 meq/L (ref 19–32)
Calcium: 8.5 mg/dL (ref 8.4–10.5)
Chloride: 100 meq/L (ref 96–112)
Cholesterol: 123 mg/dL (ref 0–200)
Creatinine, Ser: 0.58 mg/dL (ref 0.40–1.20)
Eosinophils Absolute: 0.3 10*3/uL (ref 0.0–0.7)
Eosinophils Relative: 6 % — ABNORMAL HIGH (ref 0–5)
Glucose, Bld: 71 mg/dL (ref 70–99)
HCT: 32.6 % — ABNORMAL LOW (ref 36.0–46.0)
HDL: 36 mg/dL — ABNORMAL LOW (ref >39)
Hemoglobin: 9.5 g/dL — ABNORMAL LOW (ref 12.0–15.0)
Iron: 24 ug/dL — ABNORMAL LOW (ref 42–145)
LDL Cholesterol: 67 mg/dL (ref 0–99)
Lymphocytes Relative: 26 % (ref 12–46)
Lymphs Abs: 1.3 10*3/uL (ref 0.7–4.0)
MCHC: 29.1 g/dL — ABNORMAL LOW (ref 30.0–36.0)
MCV: 74.6 fL — ABNORMAL LOW (ref 78.0–100.0)
Monocytes Absolute: 0.4 10*3/uL (ref 0.1–1.0)
Monocytes Relative: 8 % (ref 3–12)
Neutro Abs: 3.1 10*3/uL (ref 1.7–7.7)
Neutrophils Relative %: 60 % (ref 43–77)
Platelets: 341 10*3/uL (ref 150–400)
Potassium: 4.1 meq/L (ref 3.5–5.3)
RBC: 4.37 M/uL (ref 3.87–5.11)
RDW: 19.5 % — ABNORMAL HIGH (ref 11.5–15.5)
Saturation Ratios: 6 % — ABNORMAL LOW (ref 20–55)
Sodium: 139 meq/L (ref 135–145)
TIBC: 384 ug/dL (ref 250–470)
Total Bilirubin: 0.3 mg/dL (ref 0.3–1.2)
Total CHOL/HDL Ratio: 3.4
Total Protein: 7.7 g/dL (ref 6.0–8.3)
Triglycerides: 102 mg/dL (ref <150)
UIBC: 360 ug/dL
VLDL: 20 mg/dL (ref 0–40)
WBC: 5.2 10*3/uL (ref 4.0–10.5)

## 2010-04-13 ENCOUNTER — Ambulatory Visit: Payer: Self-pay | Admitting: Internal Medicine

## 2010-04-19 ENCOUNTER — Emergency Department (HOSPITAL_COMMUNITY): Admission: EM | Admit: 2010-04-19 | Discharge: 2010-04-19 | Payer: Self-pay | Admitting: Emergency Medicine

## 2010-04-20 ENCOUNTER — Ambulatory Visit: Payer: Self-pay | Admitting: Internal Medicine

## 2010-04-25 ENCOUNTER — Ambulatory Visit (HOSPITAL_COMMUNITY): Admission: RE | Admit: 2010-04-25 | Discharge: 2010-04-25 | Payer: Self-pay | Admitting: Internal Medicine

## 2010-05-09 ENCOUNTER — Ambulatory Visit: Payer: Self-pay | Admitting: Internal Medicine

## 2010-05-31 ENCOUNTER — Ambulatory Visit: Payer: Self-pay | Admitting: Internal Medicine

## 2010-06-05 ENCOUNTER — Ambulatory Visit (HOSPITAL_COMMUNITY): Admission: RE | Admit: 2010-06-05 | Discharge: 2010-06-05 | Payer: Self-pay | Admitting: Internal Medicine

## 2010-06-13 ENCOUNTER — Ambulatory Visit: Payer: Self-pay | Admitting: Internal Medicine

## 2010-06-19 ENCOUNTER — Emergency Department (HOSPITAL_COMMUNITY): Admission: EM | Admit: 2010-06-19 | Discharge: 2010-06-19 | Payer: Self-pay | Admitting: Emergency Medicine

## 2010-06-25 ENCOUNTER — Ambulatory Visit: Payer: Self-pay | Admitting: Internal Medicine

## 2010-06-27 ENCOUNTER — Encounter: Admission: RE | Admit: 2010-06-27 | Discharge: 2010-07-25 | Payer: Self-pay | Admitting: Internal Medicine

## 2010-07-19 ENCOUNTER — Emergency Department (HOSPITAL_COMMUNITY): Admission: EM | Admit: 2010-07-19 | Discharge: 2010-07-19 | Payer: Self-pay | Admitting: Family Medicine

## 2010-08-08 ENCOUNTER — Emergency Department (HOSPITAL_COMMUNITY): Admission: EM | Admit: 2010-08-08 | Discharge: 2010-08-08 | Payer: Self-pay | Admitting: Family Medicine

## 2010-08-17 ENCOUNTER — Emergency Department (HOSPITAL_COMMUNITY): Admission: EM | Admit: 2010-08-17 | Discharge: 2010-08-17 | Payer: Self-pay | Admitting: Emergency Medicine

## 2010-09-27 ENCOUNTER — Ambulatory Visit (HOSPITAL_COMMUNITY)
Admission: RE | Admit: 2010-09-27 | Discharge: 2010-09-27 | Payer: Self-pay | Source: Home / Self Care | Admitting: Family Medicine

## 2010-10-07 ENCOUNTER — Emergency Department (HOSPITAL_COMMUNITY)
Admission: EM | Admit: 2010-10-07 | Discharge: 2010-10-08 | Payer: Self-pay | Source: Home / Self Care | Admitting: Emergency Medicine

## 2010-10-08 ENCOUNTER — Encounter
Admission: RE | Admit: 2010-10-08 | Discharge: 2010-10-08 | Payer: Self-pay | Source: Home / Self Care | Attending: Family Medicine | Admitting: Family Medicine

## 2010-10-23 ENCOUNTER — Emergency Department (HOSPITAL_COMMUNITY)
Admission: EM | Admit: 2010-10-23 | Discharge: 2010-10-24 | Payer: Self-pay | Source: Home / Self Care | Admitting: Emergency Medicine

## 2010-11-18 ENCOUNTER — Encounter: Payer: Self-pay | Admitting: Family Medicine

## 2010-11-19 ENCOUNTER — Encounter (INDEPENDENT_AMBULATORY_CARE_PROVIDER_SITE_OTHER): Payer: Self-pay | Admitting: *Deleted

## 2010-11-19 LAB — CONVERTED CEMR LAB
ALT: 8 units/L (ref 0–35)
AST: 13 units/L (ref 0–37)
Albumin: 4.1 g/dL (ref 3.5–5.2)
Alkaline Phosphatase: 88 units/L (ref 39–117)
BUN: 7 mg/dL (ref 6–23)
Basophils Absolute: 0 10*3/uL (ref 0.0–0.1)
Basophils Relative: 0 % (ref 0–1)
CO2: 26 meq/L (ref 19–32)
Calcium: 9.3 mg/dL (ref 8.4–10.5)
Chloride: 99 meq/L (ref 96–112)
Creatinine, Ser: 0.64 mg/dL (ref 0.40–1.20)
Eosinophils Absolute: 0.4 10*3/uL (ref 0.0–0.7)
Eosinophils Relative: 6 % — ABNORMAL HIGH (ref 0–5)
Glucose, Bld: 63 mg/dL — ABNORMAL LOW (ref 70–99)
HCT: 32.6 % — ABNORMAL LOW (ref 36.0–46.0)
Hemoglobin: 9.5 g/dL — ABNORMAL LOW (ref 12.0–15.0)
INR: 1.01 (ref <1.50)
Lymphocytes Relative: 30 % (ref 12–46)
Lymphs Abs: 1.9 10*3/uL (ref 0.7–4.0)
MCHC: 29.1 g/dL — ABNORMAL LOW (ref 30.0–36.0)
MCV: 70.3 fL — ABNORMAL LOW (ref 78.0–100.0)
Monocytes Absolute: 0.6 10*3/uL (ref 0.1–1.0)
Monocytes Relative: 9 % (ref 3–12)
Neutro Abs: 3.5 10*3/uL (ref 1.7–7.7)
Neutrophils Relative %: 55 % (ref 43–77)
Platelets: 431 10*3/uL — ABNORMAL HIGH (ref 150–400)
Potassium: 4 meq/L (ref 3.5–5.3)
Prothrombin Time: 13.5 s (ref 11.6–15.2)
RBC: 4.64 M/uL (ref 3.87–5.11)
RDW: 20.9 % — ABNORMAL HIGH (ref 11.5–15.5)
Sodium: 138 meq/L (ref 135–145)
Total Bilirubin: 0.2 mg/dL — ABNORMAL LOW (ref 0.3–1.2)
Total Protein: 8 g/dL (ref 6.0–8.3)
WBC: 6.4 10*3/uL (ref 4.0–10.5)

## 2010-11-27 NOTE — Miscellaneous (Signed)
Summary: VIP  Patient: Sydney Williams Note: All result statuses are Final unless otherwise noted.  Tests: (1) VIP (Medications)   LLIMPORTMEDS              "Result Below..."       RESULT: VENTOLIN HFA AERS 108 (90 Base) MCG/ACT*USE TWO (2) PUFFS FOUR TIMES DAILY AS NEEDED*10/30/2006*Last Refill: 07/28/2007*[id number]*******   LLIMPORTMEDS              "Result Below..."       RESULT: VALTREX TABS 500 MG*TAKE ONE (1) TABLET TWICE DAILY FOR 10 DAYS THEN TAKE ONE (1) TABLET EACH DAY*06/23/2007*Last Refill: [id number]*******   LLIMPORTMEDS              "Result Below..."       RESULT: NASACORT AQ AERS 55 MCG/ACT*USE ONE SPRAY IN EACH NOSTRIL TWICE A DAY*01/01/2007*Last Refill: 02/09/2007*[id number]*******   LLIMPORTMEDS              "Result Below..."       RESULT: LISINOPRIL-HYDROCHLOROTHIAZIDE TABS 20-25 MG*TAKE ONE (1) TABLET EACH DAY*03/31/2007*Last Refill: 07/08/2007*[id number]*******   LLIMPORTMEDS              "Result Below..."       RESULT: IBUPROFEN TABS 800 MG*TAKE ONE (1) TABLET THREE TIMES DAILY*12/31/2006*Last Refill: 07/28/2007*[id number]*******   LLIMPORTMEDS              "Result Below..."       RESULT: HEMOCYTE PLUS CAPS 106-1 MG*TAKE ONE CAPSULE BY MOUTH DAILY*06/23/2007*Last Refill: [id number]*******   LLIMPORTMEDS              "Result Below..."       RESULT: FAMOTIDINE TABS 20 MG*TAKE TWO (2) TABLETS BY MOUTH DAILY*06/23/2007*Last Refill: 07/16/2007*[id number]*******   LLIMPORTMEDS              "Result Below..."       RESULT: CYCLOBENZAPRINE 10MG  TABS*TAKE ONE TABLET BY MOUTH THREE TIMES DAILY AS NEEDED FOR MUSCLE RELAXATION.*04/21/2007*Last Refill: 06/19/2007********   LLIMPORTMEDS              "Result Below..."       RESULT: AVAPRO TABS 150 MG*TAKE 1 AND 1/2 TABLETS BY MOUTH EVERY DAY  GENE  ICP AVAPRO 150MG  07/08*07/16/2007*Last Refill: [id number]*******   LLIMPORTMEDS              "Result Below..."       RESULT: ALLEGRA TABS 180 MG*TAKE ONE (1) TABLET EACH DAY   GENE HAS ICP  ALLEGRA 180MG   408*08/25/2007*Last Refill: [id number]*******   LLIMPORTMEDS              "Result Below..."       RESULT: ADVAIR DISKUS MISC 250-50 MCG/DOSE*INHALE ONE (1) PUFF TWICE DAILY*10/30/2006*Last Refill: 07/28/2007*[id number]*******   LLIMPORTALLS              NKDA***  Note: An exclamation mark (!) indicates a result that was not dispersed into the flowsheet. Document Creation Date: 08/27/2007 3:02 PM _______________________________________________________________________  (1) Order result status: Final Collection or observation date-time: 07/15/2007 Requested date-time: 07/15/2007 Receipt date-time:  Reported date-time: 07/15/2007 Referring Physician:   Ordering Physician:   Specimen Source:  Source: [facility] [place] Order Number:  Lab site:

## 2010-11-27 NOTE — Progress Notes (Signed)
Summary: Triage/Herpes outbreak  Phone Note Call from Patient   Caller: Patient Reason for Call: Talk to Nurse Summary of Call: Patient walked into front...states she is having a herpes outbreak and just noticed the blisters this am..Marland KitchenMarland KitchenPatient is getting Valtrex refilled in pharmacy.Marland KitchenMarland KitchenSpoke with [employer]...patient to take Valtrex 500mg  two times a day for 3 days...and call us back....patient has an appointment [facility] 11/4....patient states understanding... Initial call taken by: Conchita Paris,  August 25, 2009 10:39 AM

## 2011-01-07 LAB — WET PREP, GENITAL
Trich, Wet Prep: NONE SEEN
Yeast Wet Prep HPF POC: NONE SEEN

## 2011-01-07 LAB — DIFFERENTIAL
Basophils Absolute: 0.1 10*3/uL (ref 0.0–0.1)
Basophils Relative: 1 % (ref 0–1)
Eosinophils Absolute: 0.4 10*3/uL (ref 0.0–0.7)
Eosinophils Relative: 6 % — ABNORMAL HIGH (ref 0–5)
Lymphocytes Relative: 35 % (ref 12–46)
Lymphs Abs: 2.4 10*3/uL (ref 0.7–4.0)
Monocytes Absolute: 0.6 10*3/uL (ref 0.1–1.0)
Monocytes Relative: 9 % (ref 3–12)
Neutro Abs: 3.3 10*3/uL (ref 1.7–7.7)
Neutrophils Relative %: 49 % (ref 43–77)

## 2011-01-07 LAB — CBC
HCT: 28.3 % — ABNORMAL LOW (ref 36.0–46.0)
Hemoglobin: 8.4 g/dL — ABNORMAL LOW (ref 12.0–15.0)
MCH: 21.1 pg — ABNORMAL LOW (ref 26.0–34.0)
MCHC: 29.7 g/dL — ABNORMAL LOW (ref 30.0–36.0)
MCV: 70.9 fL — ABNORMAL LOW (ref 78.0–100.0)
Platelets: 514 10*3/uL — ABNORMAL HIGH (ref 150–400)
RBC: 3.99 MIL/uL (ref 3.87–5.11)
RDW: 19.9 % — ABNORMAL HIGH (ref 11.5–15.5)
WBC: 6.8 10*3/uL (ref 4.0–10.5)

## 2011-01-07 LAB — URINALYSIS, ROUTINE W REFLEX MICROSCOPIC
Bilirubin Urine: NEGATIVE
Glucose, UA: NEGATIVE mg/dL
Hgb urine dipstick: NEGATIVE
Ketones, ur: NEGATIVE mg/dL
Nitrite: NEGATIVE
Protein, ur: NEGATIVE mg/dL
Specific Gravity, Urine: 1.015 (ref 1.005–1.030)
Urobilinogen, UA: 1 mg/dL (ref 0.0–1.0)
pH: 6 (ref 5.0–8.0)

## 2011-01-07 LAB — COMPREHENSIVE METABOLIC PANEL
ALT: 14 U/L (ref 0–35)
AST: 18 U/L (ref 0–37)
Albumin: 3.4 g/dL — ABNORMAL LOW (ref 3.5–5.2)
Alkaline Phosphatase: 82 U/L (ref 39–117)
BUN: 6 mg/dL (ref 6–23)
CO2: 29 mEq/L (ref 19–32)
Calcium: 8.5 mg/dL (ref 8.4–10.5)
Chloride: 97 mEq/L (ref 96–112)
Creatinine, Ser: 0.7 mg/dL (ref 0.4–1.2)
GFR calc Af Amer: >60 mL/min (ref >60)
GFR calc non Af Amer: >60 mL/min (ref >60)
Glucose, Bld: 95 mg/dL (ref 70–99)
Potassium: 2.8 mEq/L — ABNORMAL LOW (ref 3.5–5.1)
Sodium: 133 mEq/L — ABNORMAL LOW (ref 135–145)
Total Bilirubin: 0.4 mg/dL (ref 0.3–1.2)
Total Protein: 7.9 g/dL (ref 6.0–8.3)

## 2011-01-07 LAB — GC/CHLAMYDIA PROBE AMP, GENITAL
Chlamydia, DNA Probe: NEGATIVE
GC Probe Amp, Genital: NEGATIVE

## 2011-01-07 LAB — PREGNANCY, URINE: Preg Test, Ur: NEGATIVE

## 2011-01-07 LAB — LIPASE, BLOOD: Lipase: 26 U/L (ref 11–59)

## 2011-01-09 LAB — RPR: RPR Ser Ql: NONREACTIVE

## 2011-01-10 LAB — GLUCOSE, CAPILLARY: Glucose-Capillary: 89 mg/dL (ref 70–99)

## 2011-02-01 LAB — POCT URINALYSIS DIP (DEVICE)
Bilirubin Urine: NEGATIVE
Glucose, UA: NEGATIVE mg/dL
Hgb urine dipstick: NEGATIVE
Ketones, ur: NEGATIVE mg/dL
Nitrite: NEGATIVE
Protein, ur: NEGATIVE mg/dL
Specific Gravity, Urine: 1.015 (ref 1.005–1.030)
Urobilinogen, UA: 0.2 mg/dL (ref 0.0–1.0)
pH: 7 (ref 5.0–8.0)

## 2011-02-01 LAB — POCT PREGNANCY, URINE: Preg Test, Ur: NEGATIVE

## 2011-02-02 LAB — COMPREHENSIVE METABOLIC PANEL
ALT: 16 U/L (ref 0–35)
AST: 21 U/L (ref 0–37)
Albumin: 3.6 g/dL (ref 3.5–5.2)
Alkaline Phosphatase: 75 U/L (ref 39–117)
BUN: 11 mg/dL (ref 6–23)
CO2: 30 mEq/L (ref 19–32)
Calcium: 8.9 mg/dL (ref 8.4–10.5)
Chloride: 102 mEq/L (ref 96–112)
Creatinine, Ser: 0.64 mg/dL (ref 0.4–1.2)
GFR calc Af Amer: >60 mL/min (ref >60)
GFR calc non Af Amer: >60 mL/min (ref >60)
Glucose, Bld: 94 mg/dL (ref 70–99)
Potassium: 3.2 mEq/L — ABNORMAL LOW (ref 3.5–5.1)
Sodium: 139 mEq/L (ref 135–145)
Total Bilirubin: 0.4 mg/dL (ref 0.3–1.2)
Total Protein: 7.9 g/dL (ref 6.0–8.3)

## 2011-02-02 LAB — CBC
HCT: 36.5 % (ref 36.0–46.0)
Hemoglobin: 11.8 g/dL — ABNORMAL LOW (ref 12.0–15.0)
MCHC: 32.2 g/dL (ref 30.0–36.0)
MCV: 83.3 fL (ref 78.0–100.0)
Platelets: 350 10*3/uL (ref 150–400)
RBC: 4.38 MIL/uL (ref 3.87–5.11)
RDW: 20 % — ABNORMAL HIGH (ref 11.5–15.5)
WBC: 7.1 10*3/uL (ref 4.0–10.5)

## 2011-02-02 LAB — DIFFERENTIAL
Basophils Absolute: 0.1 10*3/uL (ref 0.0–0.1)
Basophils Relative: 2 % — ABNORMAL HIGH (ref 0–1)
Eosinophils Absolute: 0.4 10*3/uL (ref 0.0–0.7)
Eosinophils Relative: 5 % (ref 0–5)
Lymphocytes Relative: 29 % (ref 12–46)
Lymphs Abs: 2.1 10*3/uL (ref 0.7–4.0)
Monocytes Absolute: 0.9 10*3/uL (ref 0.1–1.0)
Monocytes Relative: 12 % (ref 3–12)
Neutro Abs: 3.6 10*3/uL (ref 1.7–7.7)
Neutrophils Relative %: 52 % (ref 43–77)

## 2011-02-02 LAB — LIPASE, BLOOD: Lipase: 26 U/L (ref 11–59)

## 2011-02-04 LAB — DIFFERENTIAL
Basophils Absolute: 0 10*3/uL (ref 0.0–0.1)
Basophils Relative: 1 % (ref 0–1)
Eosinophils Absolute: 0.4 10*3/uL (ref 0.0–0.7)
Eosinophils Relative: 6 % — ABNORMAL HIGH (ref 0–5)
Lymphocytes Relative: 31 % (ref 12–46)
Lymphs Abs: 2.1 10*3/uL (ref 0.7–4.0)
Monocytes Absolute: 0.6 10*3/uL (ref 0.1–1.0)
Monocytes Relative: 9 % (ref 3–12)
Neutro Abs: 3.5 10*3/uL (ref 1.7–7.7)
Neutrophils Relative %: 53 % (ref 43–77)

## 2011-02-04 LAB — CBC
HCT: 35 % — ABNORMAL LOW (ref 36.0–46.0)
Hemoglobin: 11.2 g/dL — ABNORMAL LOW (ref 12.0–15.0)
MCHC: 32 g/dL (ref 30.0–36.0)
MCV: 83.7 fL (ref 78.0–100.0)
Platelets: 327 10*3/uL (ref 150–400)
RBC: 4.18 MIL/uL (ref 3.87–5.11)
RDW: 15.8 % — ABNORMAL HIGH (ref 11.5–15.5)
WBC: 6.6 10*3/uL (ref 4.0–10.5)

## 2011-02-04 LAB — POCT CARDIAC MARKERS
CKMB, poc: <1 ng/mL — ABNORMAL LOW (ref 1.0–8.0)
CKMB, poc: <1 ng/mL — ABNORMAL LOW (ref 1.0–8.0)
Myoglobin, poc: 51.1 ng/mL (ref 12–200)
Myoglobin, poc: 75.1 ng/mL (ref 12–200)
Troponin i, poc: <0.05 ng/mL (ref 0.00–0.09)
Troponin i, poc: <0.05 ng/mL (ref 0.00–0.09)

## 2011-02-04 LAB — COMPREHENSIVE METABOLIC PANEL
ALT: 15 U/L (ref 0–35)
AST: 19 U/L (ref 0–37)
Albumin: 3.4 g/dL — ABNORMAL LOW (ref 3.5–5.2)
Alkaline Phosphatase: 85 U/L (ref 39–117)
BUN: 6 mg/dL (ref 6–23)
CO2: 31 mEq/L (ref 19–32)
Calcium: 9.3 mg/dL (ref 8.4–10.5)
Chloride: 102 mEq/L (ref 96–112)
Creatinine, Ser: 0.63 mg/dL (ref 0.4–1.2)
GFR calc Af Amer: >60 mL/min (ref >60)
GFR calc non Af Amer: >60 mL/min (ref >60)
Glucose, Bld: 86 mg/dL (ref 70–99)
Potassium: 3.2 mEq/L — ABNORMAL LOW (ref 3.5–5.1)
Sodium: 141 mEq/L (ref 135–145)
Total Bilirubin: 0.3 mg/dL (ref 0.3–1.2)
Total Protein: 7.3 g/dL (ref 6.0–8.3)

## 2011-02-04 LAB — PROTIME-INR
INR: 1 (ref 0.00–1.49)
Prothrombin Time: 13.5 seconds (ref 11.6–15.2)

## 2011-02-04 LAB — APTT: aPTT: 39 seconds — ABNORMAL HIGH (ref 24–37)

## 2011-03-15 NOTE — Op Note (Signed)
NAMELATRELLE, FUSTON                 ACCOUNT NO.:  [id number]   MEDICAL RECORD NO.:  [id number]          PATIENT TYPE:  AMB   LOCATION:  [facility]SC                          FACILITY:  [facility]   PHYSICIAN:  Christopher E. Ezzard Standing, M.D.DATE OF BIRTH:  1966/10/07   DATE OF PROCEDURE:  06/13/2006  DATE OF DISCHARGE:                                 OPERATIVE REPORT   SURGEON:  Cristal Deer E. Ezzard Standing, M.D.   ASSISTANT:  Hermelinda Medicus, M.D.   PREOPERATIVE DIAGNOSIS:  Left parotid tumor.   POSTOPERATIVE DIAGNOSIS:  Left parotid tumor.   OPERATION:  Left superficial parotidectomy with facial nerve dissection and  excision of left parotid mass.   ANESTHESIA:  General endotracheal.   COMPLICATIONS:  None.   BRIEF CLINICAL NOTE:  Sabine Tenenbaum is a 45 year old female, who has noted a  left parotid mass now for a couple months.  She underwent a CT scan, which  showed an approximately 2-cm left parotid mass, consistent with a possible  parotid tumor.  She is taken to the operating room at this time for a left  superficial parotidectomy with facial nerve dissection.  I discussed with  her the risks of excision of this and the risks of damage to the facial  nerve.   DESCRIPTION OF PROCEDURE:  After adequate endotracheal anesthesia, the  patient's left neck was prepped with Betadine solution and draped out in  sterile towels.  A standard parotid incision was marked out and injected  with Xylocaine with epinephrine.  Incision was then carried out down through  the subcutaneous tissue, down to the parotid fascia.  Flaps were elevated  anteriorly and posteriorly around the mass.  The mass was located in the  tail region of the parotid gland.  Dissection was then carried down along  the cartilage of the ear, down to the styloid process, which was identified.  The facial trunk was then identified just inferior to the styloid process.  The facial trunk was dissected out through the parotid gland.  The  inferior  branches of the facial nerve were dissected out, and the parotid gland was  dissected off the inferior branches of the facial nerve.  The mass was  located in the tail of the region just inferior to the inferior branches of  the facial nerve.  With the branches under direct vision, the parotid mass  and inferior portion of the parotid gland were dissected off of the branches  and sent to pathology.  Hemostasis was obtained with 2-0 silk ligatures and  the bipolar cautery.  Hemostasis was obtained.  The wound was irrigated and  a 10-French drain was brought out through a separate stab incision  posteriorly.  After obtaining adequate hemostasis, the wound was closed with  3-0 chromic sutures subcutaneously and 5-0 nylon to reapproximate the skin  edges.  This completed the procedure.  Jaynell was awakened from anesthesia  and transferred to recovery postop doing well.   DISPOSITION:  Indya will be [facility] overnight in [facility],  and discharged home in the morning after removing the JP drain.  She was  given perioperative Ancef.  She will be discharged home in the morning on  Tylenol and Versed p.r.n. pain, and will have her follow up in my office in  1 week to review pathology and to have sutures removed.           ______________________________  Kristine Garbe Ezzard Standing, M.D.     CEN/MEDQ  D:  06/13/2006  T:  06/13/2006  Job:  [id number]   cc:   Tresa Endo L. Philipp Deputy, M.D.  Hermelinda Medicus, M.D.

## 2011-04-23 ENCOUNTER — Emergency Department (HOSPITAL_COMMUNITY)
Admission: EM | Admit: 2011-04-23 | Discharge: 2011-04-23 | Disposition: A | Discharge status: Home / Self Care | Payer: Self-pay | Attending: Emergency Medicine | Admitting: Emergency Medicine

## 2011-04-23 DIAGNOSIS — K219 Gastro-esophageal reflux disease without esophagitis: Secondary | ICD-10-CM | POA: Insufficient documentation

## 2011-04-23 DIAGNOSIS — M545 Low back pain, unspecified: Secondary | ICD-10-CM | POA: Insufficient documentation

## 2011-04-23 DIAGNOSIS — Z79899 Other long term (current) drug therapy: Secondary | ICD-10-CM | POA: Insufficient documentation

## 2011-04-23 DIAGNOSIS — Z7982 Long term (current) use of aspirin: Secondary | ICD-10-CM | POA: Insufficient documentation

## 2011-04-23 DIAGNOSIS — E119 Type 2 diabetes mellitus without complications: Secondary | ICD-10-CM | POA: Insufficient documentation

## 2011-04-23 DIAGNOSIS — J45909 Unspecified asthma, uncomplicated: Secondary | ICD-10-CM | POA: Insufficient documentation

## 2011-04-23 DIAGNOSIS — E785 Hyperlipidemia, unspecified: Secondary | ICD-10-CM | POA: Insufficient documentation

## 2011-04-23 DIAGNOSIS — M546 Pain in thoracic spine: Secondary | ICD-10-CM | POA: Insufficient documentation

## 2011-04-23 DIAGNOSIS — N390 Urinary tract infection, site not specified: Secondary | ICD-10-CM | POA: Insufficient documentation

## 2011-04-23 DIAGNOSIS — I10 Essential (primary) hypertension: Secondary | ICD-10-CM | POA: Insufficient documentation

## 2011-04-23 DIAGNOSIS — M1990 Unspecified osteoarthritis, unspecified site: Secondary | ICD-10-CM | POA: Insufficient documentation

## 2011-04-23 LAB — URINALYSIS, ROUTINE W REFLEX MICROSCOPIC
Bilirubin Urine: NEGATIVE
Glucose, UA: NEGATIVE mg/dL
Hgb urine dipstick: NEGATIVE
Ketones, ur: NEGATIVE mg/dL
Leukocytes, UA: NEGATIVE
Nitrite: NEGATIVE
Protein, ur: NEGATIVE mg/dL
Specific Gravity, Urine: 1.017 (ref 1.005–1.030)
Urobilinogen, UA: 0.2 mg/dL (ref 0.0–1.0)
pH: 5.5 (ref 5.0–8.0)

## 2011-04-23 LAB — URINE MICROSCOPIC-ADD ON

## 2011-04-23 LAB — POCT PREGNANCY, URINE: Preg Test, Ur: NEGATIVE

## 2011-04-24 LAB — URINE CULTURE
Colony Count: >=100000
Culture  Setup Time: 201206261135

## 2011-08-02 LAB — POCT CARDIAC MARKERS
CKMB, poc: <1 — ABNORMAL LOW
Myoglobin, poc: 55.7
Operator id: 257131
Troponin i, poc: <0.05

## 2011-08-02 LAB — D-DIMER, QUANTITATIVE: D-Dimer, Quant: 0.38

## 2011-10-03 ENCOUNTER — Emergency Department (INDEPENDENT_AMBULATORY_CARE_PROVIDER_SITE_OTHER)
Admission: EM | Admit: 2011-10-03 | Discharge: 2011-10-03 | Disposition: A | Discharge status: Home / Self Care | Payer: Self-pay | Source: Home / Self Care | Attending: Emergency Medicine | Admitting: Emergency Medicine

## 2011-10-03 ENCOUNTER — Encounter: Payer: Self-pay | Admitting: *Deleted

## 2011-10-03 DIAGNOSIS — H109 Unspecified conjunctivitis: Secondary | ICD-10-CM

## 2011-10-03 HISTORY — DX: Migraine, unspecified, not intractable, without status migrainosus: G43.909

## 2011-10-03 HISTORY — DX: Gastro-esophageal reflux disease without esophagitis: K21.9

## 2011-10-03 HISTORY — DX: Essential (primary) hypertension: I10

## 2011-10-03 MED ORDER — TETRACAINE HCL 0.5 % OP SOLN: 1.0000 [drp] | Freq: Once | OPHTHALMIC | Status: DC

## 2011-10-03 MED ORDER — ACYCLOVIR 800 MG PO TABS: 800.0000 mg | ORAL_TABLET | Freq: Every day | ORAL | Status: AC

## 2011-10-03 MED ORDER — TETRACAINE HCL 0.5 % OP SOLN
OPHTHALMIC | Status: AC
  Filled 2011-10-03: qty 2

## 2011-10-03 NOTE — ED Provider Notes (Signed)
History     CSN: [id number] Arrival date & time: 10/03/2011  7:44 PM   First MD Initiated Contact with Patient 10/03/11 1936      Chief Complaint  Patient presents with  . Eye Problem    HPI Comments: Pt with right periorbital itching x several days. Today pain, swelling, "bumps" around eye. Eye discharge today. No blisters, visual changes, N/V, ear pain, fevers, eye pain, photophobia, other rash. States feels a gradual onset throbbing right sided HA starting now c/w previous migraines. Wears glasses. 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   The history is provided by the patient.    Past Medical History  Diagnosis Date  . Diabetes mellitus   . Hypertension   . Asthma   . GERD (gastroesophageal reflux disease)     Past Surgical History  Procedure Date  . Btl   . Tubal ligation     Family History  Problem Relation Age of Onset  . Diabetes Mother   . Hypertension Mother     History  Substance Use Topics  . Smoking status: Current Everyday Smoker  . Smokeless tobacco: Not on file  . Alcohol Use: No    OB History    Grav Para Term Preterm Abortions TAB SAB Ect Mult Living                  Review of Systems  Constitutional: Negative for fever.  HENT: Negative for ear pain, congestion, sore throat, neck pain and neck stiffness.   Eyes: Positive for discharge and itching. Negative for photophobia, pain, redness and visual disturbance.  Gastrointestinal: Negative for nausea and vomiting.    Allergies  Vicodin  Home Medications   Current Outpatient Rx  Name Route Sig Dispense Refill  . ALBUTEROL SULFATE HFA 108 (90 BASE) MCG/ACT IN AERS Inhalation Inhale 2 puffs into the lungs every 6 (six) hours as needed.      Marland Kitchen AMLODIPINE BESYLATE 5 MG PO TABS Oral Take 5 mg by mouth daily.      . ATORVASTATIN CALCIUM 20 MG PO TABS Oral Take 20 mg by mouth daily.      Marland Kitchen FLUTICASONE-SALMETEROL 250-50  MCG/DOSE IN AEPB Inhalation Inhale 1 puff into the lungs every 12 (twelve) hours.      . IBUPROFEN 800 MG PO TABS Oral Take 800 mg by mouth every 8 (eight) hours as needed.      Marland Kitchen LISINOPRIL-HYDROCHLOROTHIAZIDE 20-12.5 MG PO TABS Oral Take 1 tablet by mouth daily.      Marland Kitchen METFORMIN HCL 1000 MG PO TABS Oral Take 1,000 mg by mouth 2 (two) times daily with a meal.      . PANTOPRAZOLE SODIUM 40 MG PO TBEC Oral Take 40 mg by mouth daily.      Marland Kitchen RABEPRAZOLE SODIUM 20 MG PO TBEC Oral Take 20 mg by mouth daily.        BP 125/92  Pulse 90  Temp(Src) 97.7 F (36.5 C) (Oral)  Resp 20  SpO2 98%  LMP 10/03/2011  Physical Exam  Nursing note and vitals reviewed. Constitutional: She is oriented to person, place, and time. She appears well-developed and well-nourished. No distress.  HENT:  Head: Normocephalic and atraumatic.  Eyes: Conjunctivae and EOM are normal. Pupils are equal, round, and reactive to light. No foreign bodies found. Right eye exhibits discharge. Right eye exhibits no chemosis and no hordeolum. No foreign body present in the right eye. No  scleral icterus.       Vis acuity 20/50 R eye 20/20 R eye uncorrected. States R eye normally weaker than the other. Mild periorbital swelling ? Bumps? Around R eye. (+) discharge right eye. No pain with EOM's. flourescin neg corneal abrasion, dendrites  Neck: Normal range of motion. Neck supple.  Cardiovascular: Regular rhythm.   Pulmonary/Chest: Effort normal and breath sounds normal.  Abdominal: She exhibits no distension.  Musculoskeletal: Normal range of motion.  Neurological: She is alert and oriented to person, place, and time. She has normal strength. No cranial nerve deficit or sensory deficit. Coordination and gait normal.  Skin: Skin is warm and dry.  Psychiatric: She has a normal mood and affect. Her behavior is normal. Judgment and thought content normal.    ED Course  Procedures (including critical care time)  Labs Reviewed - No  data to display No results found.   1. Conjunctivitis of right eye     MDM  Suspect early shingles with early "bumps" around eye but no rash/blisters yet.  no evidence of corneal involvement today. No evidence of bacterial conjuncitivits will start on acyclovir and have her return in 48 hrs. Can start abx eye drops at that time.   Luiz Blare, MD 10/03/11 2306

## 2011-10-03 NOTE — ED Notes (Signed)
Pt  Reports  She  Woke  Up  This  Am  With  Crusty  Draining  Eyes   The  Eyes  Are  Red  And  Irritated  As  Well      She  Reports  Headache  Too

## 2011-10-05 ENCOUNTER — Emergency Department (INDEPENDENT_AMBULATORY_CARE_PROVIDER_SITE_OTHER)
Admission: EM | Admit: 2011-10-05 | Discharge: 2011-10-05 | Disposition: A | Discharge status: Home / Self Care | Payer: Self-pay | Source: Home / Self Care

## 2011-10-05 ENCOUNTER — Encounter (HOSPITAL_COMMUNITY): Payer: Self-pay

## 2011-10-05 DIAGNOSIS — H109 Unspecified conjunctivitis: Secondary | ICD-10-CM

## 2011-10-05 MED ORDER — ERYTHROMYCIN 5 MG/GM OP OINT: TOPICAL_OINTMENT | OPHTHALMIC | Status: DC

## 2011-10-05 NOTE — ED Provider Notes (Signed)
History     CSN: [id number] Arrival date & time: 10/05/2011 11:32 AM   None     Chief Complaint  Patient presents with  . Eye Pain    Pt was told to return today for recheck of rt eye pain, pt states it is worse than on thursday    (Consider location/radiation/quality/duration/timing/severity/associated sxs/prior treatment) HPI Comments: Pt seen at urgent care 2 days ago for same; here for recheck. Previous chart indicates concern for shingles.  Pt with irritated skin/rash medial and inferior to R eye - pt says it burns/itches. Also discharge from B eyes, R worse than L, eyes glued shut in morning from discharge, R eye also red. NO eye pain.   Patient is a 45 y.o. female presenting with eye pain. The history is provided by the patient.  Eye Pain This is a new problem. Episode onset: 12/5. The problem occurs constantly. The problem has been gradually worsening. Pertinent negatives include no headaches. The symptoms are aggravated by nothing. The symptoms are relieved by nothing. She has tried a warm compress (acyclovir) for the symptoms. The treatment provided no relief.    Past Medical History  Diagnosis Date  . Diabetes mellitus   . Hypertension   . Asthma   . GERD (gastroesophageal reflux disease)   . Migraines     Past Surgical History  Procedure Date  . Btl   . Tubal ligation     Family History  Problem Relation Age of Onset  . Diabetes Mother   . Hypertension Mother     History  Substance Use Topics  . Smoking status: Current Everyday Smoker -- 2.0 packs/day for 10 years    Types: Cigarettes  . Smokeless tobacco: Not on file  . Alcohol Use: No    OB History    Grav Para Term Preterm Abortions TAB SAB Ect Mult Living                  Review of Systems  Eyes: Positive for pain, discharge and redness. Negative for itching and visual disturbance.  Skin: Positive for rash.       Rash or irritated skin around R eye  Neurological: Negative for headaches.     Allergies  Vicodin  Home Medications   Current Outpatient Rx  Name Route Sig Dispense Refill  . ACYCLOVIR 800 MG PO TABS Oral Take 1 tablet (800 mg total) by mouth 5 (five) times daily. 1 tab po 5 times a day for 5 days. 25 tablet 0  . ALBUTEROL SULFATE HFA 108 (90 BASE) MCG/ACT IN AERS Inhalation Inhale 2 puffs into the lungs every 6 (six) hours as needed.      Marland Kitchen AMLODIPINE BESYLATE 5 MG PO TABS Oral Take 5 mg by mouth daily.      . ATORVASTATIN CALCIUM 20 MG PO TABS Oral Take 20 mg by mouth daily.      . ERYTHROMYCIN 5 MG/GM OP OINT  Place a 1/2 inch ribbon of ointment into the lower eyelid of both eyes 4 times a day. 3.5 g 0  . FLUTICASONE-SALMETEROL 250-50 MCG/DOSE IN AEPB Inhalation Inhale 1 puff into the lungs every 12 (twelve) hours.      . IBUPROFEN 800 MG PO TABS Oral Take 800 mg by mouth every 8 (eight) hours as needed.      Marland Kitchen LISINOPRIL-HYDROCHLOROTHIAZIDE 20-12.5 MG PO TABS Oral Take 1 tablet by mouth daily.      Marland Kitchen METFORMIN HCL 1000 MG PO TABS Oral Take  1,000 mg by mouth 2 (two) times daily with a meal.      . PANTOPRAZOLE SODIUM 40 MG PO TBEC Oral Take 40 mg by mouth daily.      Marland Kitchen RABEPRAZOLE SODIUM 20 MG PO TBEC Oral Take 20 mg by mouth daily.        BP 124/87  Pulse 95  Temp(Src) 98.3 F (36.8 C) (Oral)  Resp 20  SpO2 97%  LMP 10/03/2011  Physical Exam  Constitutional: She appears well-developed and well-nourished. No distress.  Eyes: EOM are normal. Pupils are equal, round, and reactive to light. Right eye exhibits discharge. Left eye exhibits discharge. Right conjunctiva is injected. Left conjunctiva is injected.    Pulmonary/Chest: Effort normal.  Lymphadenopathy:       Head (right side): No preauricular adenopathy present.       Head (left side): No preauricular adenopathy present.    She has no cervical adenopathy.  Skin: Skin is warm and dry.       See eye diagram for location of irritated skin; skin in this area is discolored patch, looks c/w [id number]     ED Course  Procedures (including critical care time)  Labs Reviewed - No data to display No results found.   1. Conjunctivitis       MDM          Cathlyn Parsons, NP 10/05/11 1227

## 2011-10-05 NOTE — ED Provider Notes (Signed)
Medical screening examination/treatment/procedure(s) were performed by non-physician practitioner and as supervising physician I was immediately available for consultation/collaboration.  LANEY,RONNIE   Ronnie Laney, MD 10/05/11 1259 

## 2011-10-12 ENCOUNTER — Emergency Department (INDEPENDENT_AMBULATORY_CARE_PROVIDER_SITE_OTHER)
Admission: EM | Admit: 2011-10-12 | Discharge: 2011-10-12 | Disposition: A | Discharge status: Home / Self Care | Payer: Self-pay | Source: Home / Self Care | Attending: Emergency Medicine | Admitting: Emergency Medicine

## 2011-10-12 ENCOUNTER — Encounter (HOSPITAL_COMMUNITY): Payer: Self-pay

## 2011-10-12 DIAGNOSIS — H11429 Conjunctival edema, unspecified eye: Secondary | ICD-10-CM

## 2011-10-12 DIAGNOSIS — H11423 Conjunctival edema, bilateral: Secondary | ICD-10-CM

## 2011-10-12 MED ORDER — TOBRAMYCIN 0.3 % OP SOLN: 1.0000 [drp] | Freq: Four times a day (QID) | OPHTHALMIC | Status: AC

## 2011-10-12 NOTE — ED Provider Notes (Addendum)
History     CSN: [id number] Arrival date & time: 10/12/2011  4:59 PM   First MD Initiated Contact with Patient 10/12/11 1537      Chief Complaint  Patient presents with  . Eye Problem    (Consider location/radiation/quality/duration/timing/severity/associated sxs/prior treatment) HPI Comments: My R eye, its blurry, and the redness is not getting better, No it doesn't hurt No HA No fevers  Patient is a 45 y.o. female presenting with eye problem. The history is provided by the patient.  Eye Problem  This is a recurrent problem. The problem has not changed since onset.There is pain in both eyes. The pain is at a severity of 0/10. The patient is experiencing no pain. There is no history of trauma to the eye. There is known exposure to pink eye. She does not wear contacts. Associated symptoms include blurred vision, eye redness and itching. Pertinent negatives include no numbness, no decreased vision, no discharge, no foreign body sensation, no photophobia and no nausea.    Past Medical History  Diagnosis Date  . Diabetes mellitus   . Hypertension   . Asthma   . GERD (gastroesophageal reflux disease)   . Migraines     Past Surgical History  Procedure Date  . Btl   . Tubal ligation     Family History  Problem Relation Age of Onset  . Diabetes Mother   . Hypertension Mother     History  Substance Use Topics  . Smoking status: Current Everyday Smoker -- 2.0 packs/day for 10 years    Types: Cigarettes  . Smokeless tobacco: Not on file  . Alcohol Use: No    OB History    Grav Para Term Preterm Abortions TAB SAB Ect Mult Living                  Review of Systems  Eyes: Positive for blurred vision and redness. Negative for photophobia and discharge.  Gastrointestinal: Negative for nausea.  Skin: Positive for itching.  Neurological: Negative for numbness.    Allergies  Vicodin  Home Medications   Current Outpatient Rx  Name Route Sig Dispense Refill  .  ACYCLOVIR 800 MG PO TABS Oral Take 1 tablet (800 mg total) by mouth 5 (five) times daily. 1 tab po 5 times a day for 5 days. 25 tablet 0  . ALBUTEROL SULFATE HFA 108 (90 BASE) MCG/ACT IN AERS Inhalation Inhale 2 puffs into the lungs every 6 (six) hours as needed.      Marland Kitchen AMLODIPINE BESYLATE 5 MG PO TABS Oral Take 5 mg by mouth daily.      . ATORVASTATIN CALCIUM 20 MG PO TABS Oral Take 20 mg by mouth daily.      Marland Kitchen FLUTICASONE-SALMETEROL 250-50 MCG/DOSE IN AEPB Inhalation Inhale 1 puff into the lungs every 12 (twelve) hours.      . IBUPROFEN 800 MG PO TABS Oral Take 800 mg by mouth every 8 (eight) hours as needed.      Marland Kitchen LISINOPRIL-HYDROCHLOROTHIAZIDE 20-12.5 MG PO TABS Oral Take 1 tablet by mouth daily.      Marland Kitchen METFORMIN HCL 1000 MG PO TABS Oral Take 1,000 mg by mouth 2 (two) times daily with a meal.      . PANTOPRAZOLE SODIUM 40 MG PO TBEC Oral Take 40 mg by mouth daily.      Marland Kitchen RABEPRAZOLE SODIUM 20 MG PO TBEC Oral Take 20 mg by mouth daily.      . TOBRAMYCIN SULFATE 0.3 % OP  SOLN Both Eyes Place 1 drop into both eyes every 6 (six) hours. X 5 DAYS 5 mL 0    LMP 10/03/2011  Physical Exam  Nursing note and vitals reviewed. Eyes: EOM are normal. Pupils are equal, round, and reactive to light. Right eye exhibits no discharge and no exudate. No foreign body present in the right eye. Left eye exhibits no discharge and no exudate. No foreign body present in the left eye. Right conjunctiva is injected. Right conjunctiva has no hemorrhage. Left conjunctiva is injected. Left conjunctiva has no hemorrhage. No scleral icterus.      ED Course  Procedures (including critical care time)  Labs Reviewed - No data to display No results found.   1. Conjunctival edema of both eyes       MDM  3rd Visit- visual acuity improved- from first visit- No pain- mild B/L erythema- with some edema- instructed to follow-up with opthalmology using erythromycin ointment describing blurriness change to abx  drops.        Jimmie Molly, MD 10/12/11 [id number]  Jimmie Molly, MD 10/12/11 [phone]

## 2011-10-12 NOTE — ED Notes (Signed)
Pt was here on 12-6 and txed for shingles in rt eye with acyclovir and on 12-8 she was seen again and txed for conjunctivitis with erythromycin ointment, pt states it feels worse and now her vision in her rt eye is cloudy.

## 2011-10-24 ENCOUNTER — Emergency Department (HOSPITAL_COMMUNITY)
Admission: EM | Admit: 2011-10-24 | Discharge: 2011-10-24 | Disposition: A | Discharge status: Home / Self Care | Payer: Self-pay | Attending: Emergency Medicine | Admitting: Emergency Medicine

## 2011-10-24 ENCOUNTER — Encounter (HOSPITAL_COMMUNITY): Payer: Self-pay | Admitting: Emergency Medicine

## 2011-10-24 ENCOUNTER — Emergency Department (HOSPITAL_COMMUNITY): Payer: Self-pay

## 2011-10-24 DIAGNOSIS — R110 Nausea: Secondary | ICD-10-CM | POA: Insufficient documentation

## 2011-10-24 DIAGNOSIS — K219 Gastro-esophageal reflux disease without esophagitis: Secondary | ICD-10-CM | POA: Insufficient documentation

## 2011-10-24 DIAGNOSIS — R109 Unspecified abdominal pain: Secondary | ICD-10-CM | POA: Insufficient documentation

## 2011-10-24 DIAGNOSIS — I10 Essential (primary) hypertension: Secondary | ICD-10-CM | POA: Insufficient documentation

## 2011-10-24 DIAGNOSIS — N644 Mastodynia: Secondary | ICD-10-CM | POA: Insufficient documentation

## 2011-10-24 DIAGNOSIS — D259 Leiomyoma of uterus, unspecified: Secondary | ICD-10-CM | POA: Insufficient documentation

## 2011-10-24 DIAGNOSIS — N898 Other specified noninflammatory disorders of vagina: Secondary | ICD-10-CM | POA: Insufficient documentation

## 2011-10-24 DIAGNOSIS — E119 Type 2 diabetes mellitus without complications: Secondary | ICD-10-CM | POA: Insufficient documentation

## 2011-10-24 DIAGNOSIS — Z79899 Other long term (current) drug therapy: Secondary | ICD-10-CM | POA: Insufficient documentation

## 2011-10-24 DIAGNOSIS — N939 Abnormal uterine and vaginal bleeding, unspecified: Secondary | ICD-10-CM

## 2011-10-24 DIAGNOSIS — J45909 Unspecified asthma, uncomplicated: Secondary | ICD-10-CM | POA: Insufficient documentation

## 2011-10-24 LAB — POCT I-STAT, CHEM 8
BUN: 6 mg/dL (ref 6–23)
Calcium, Ion: 1.09 mmol/L — ABNORMAL LOW (ref 1.12–1.32)
Chloride: 99 meq/L (ref 96–112)
Creatinine, Ser: 0.8 mg/dL (ref 0.50–1.10)
Glucose, Bld: 95 mg/dL (ref 70–99)
HCT: 41 % (ref 36.0–46.0)
Hemoglobin: 13.9 g/dL (ref 12.0–15.0)
Potassium: 3.4 meq/L — ABNORMAL LOW (ref 3.5–5.1)
Sodium: 141 meq/L (ref 135–145)
TCO2: 30 mmol/L (ref 0–100)

## 2011-10-24 LAB — URINE MICROSCOPIC-ADD ON

## 2011-10-24 LAB — URINALYSIS, ROUTINE W REFLEX MICROSCOPIC
Bilirubin Urine: NEGATIVE
Glucose, UA: NEGATIVE mg/dL
Ketones, ur: NEGATIVE mg/dL
Leukocytes, UA: NEGATIVE
Nitrite: NEGATIVE
Protein, ur: NEGATIVE mg/dL
Specific Gravity, Urine: 1.021 (ref 1.005–1.030)
Urobilinogen, UA: 0.2 mg/dL (ref 0.0–1.0)
pH: 6 (ref 5.0–8.0)

## 2011-10-24 LAB — POCT PREGNANCY, URINE: Preg Test, Ur: NEGATIVE

## 2011-10-24 MED ORDER — SODIUM CHLORIDE 0.9 % IV BOLUS (SEPSIS): 1000.0000 mL | Freq: Once | INTRAVENOUS | Status: DC

## 2011-10-24 MED ORDER — SODIUM CHLORIDE 0.9 % IV SOLN: Freq: Once | INTRAVENOUS | Status: DC

## 2011-10-24 MED ORDER — IBUPROFEN 800 MG PO TABS: 800.0000 mg | ORAL_TABLET | Freq: Three times a day (TID) | ORAL | Status: AC

## 2011-10-24 MED ORDER — ONDANSETRON HCL 4 MG/2ML IJ SOLN: 4.0000 mg | Freq: Once | INTRAMUSCULAR | Status: DC

## 2011-10-24 NOTE — ED Provider Notes (Signed)
History     CSN: [id number]  Arrival date & time 10/24/11  1339   First MD Initiated Contact with Patient 10/24/11 1523      Chief Complaint  Patient presents with  . Abdominal Pain  . Vaginal Bleeding  . Nausea    (Consider location/radiation/quality/duration/timing/severity/associated sxs/prior treatment) The history is provided by the patient.   Patient presents with vaginal bleeding. She states that she typically has regular menstrual periods monthly. LMP was December 4 and was normal for her, lasting approximately a week. She noted last week that she had some spotting for about 2 days. On Tuesday of this week, she began to have heavy vaginal bleeding which has increased. States she has been having to change her pads approximately every 30 minutes. She has had accompanying lower midline abdominal pain. Additionally, she states that her breasts have been sore and she has been nauseous for about the past 2 weeks. Denies vaginal d/c, urinary sx. No change in BMs. She is not currently on any hormonal contraception, and has no history of bleeding disorders. She does not think that she could currently be pregnant.  She denies history of GYN issues such as fibroids. She has had tubal ligation, but otherwise denies GYN surgeries. She receives her GYN care at [facility].  Past Medical History  Diagnosis Date  . Diabetes mellitus   . Hypertension   . Asthma   . GERD (gastroesophageal reflux disease)   . Migraines     Past Surgical History  Procedure Date  . Btl   . Tubal ligation     Family History  Problem Relation Age of Onset  . Diabetes Mother   . Hypertension Mother     History  Substance Use Topics  . Smoking status: Current Everyday Smoker -- 0.5 packs/day    Types: Cigarettes  . Smokeless tobacco: Not on file  . Alcohol Use: No    OB History    Grav Para Term Preterm Abortions TAB SAB Ect Mult Living                  Review of Systems  Constitutional:  Negative for fever, chills, activity change, appetite change and unexpected weight change.  HENT: Negative.   Respiratory: Negative for cough, chest tightness and shortness of breath.   Cardiovascular: Negative for chest pain.  Gastrointestinal: Positive for nausea and abdominal pain. Negative for vomiting, diarrhea and constipation.  Genitourinary: Negative for dysuria, frequency, hematuria, flank pain, decreased urine volume, difficulty urinating, pelvic pain and dyspareunia.  Musculoskeletal: Negative for myalgias.  Skin: Negative for color change and rash.  Neurological: Negative for dizziness and weakness.    Allergies  Vicodin  Home Medications   Current Outpatient Rx  Name Route Sig Dispense Refill  . ACYCLOVIR 200 MG PO CAPS Oral Take 200 mg by mouth 2 (two) times daily.      . ALBUTEROL SULFATE HFA 108 (90 BASE) MCG/ACT IN AERS Inhalation Inhale 2 puffs into the lungs every 6 (six) hours as needed.      Marland Kitchen AMLODIPINE BESYLATE 5 MG PO TABS Oral Take 5 mg by mouth daily.      . ATORVASTATIN CALCIUM 20 MG PO TABS Oral Take 20 mg by mouth daily.      Marland Kitchen FLUTICASONE-SALMETEROL 250-50 MCG/DOSE IN AEPB Inhalation Inhale 1 puff into the lungs every 12 (twelve) hours.      Marland Kitchen LISINOPRIL-HYDROCHLOROTHIAZIDE 20-12.5 MG PO TABS Oral Take 1 tablet by mouth daily.      Marland Kitchen  METFORMIN HCL 1000 MG PO TABS Oral Take 1,000 mg by mouth 2 (two) times daily with a meal.      . NAPROXEN 500 MG PO TABS Oral Take 500 mg by mouth 2 (two) times daily as needed.      Marland Kitchen PANTOPRAZOLE SODIUM 40 MG PO TBEC Oral Take 40 mg by mouth daily.        BP 169/99  Pulse 87  Temp(Src) 98.8 F (37.1 C) (Oral)  Resp 16  SpO2 98%  LMP 10/01/2011  Physical Exam  Nursing note and vitals reviewed. Constitutional: She appears well-developed and well-nourished. No distress.  HENT:  Head: Normocephalic and atraumatic.  Left Ear: External ear normal.  Neck: Normal range of motion.  Cardiovascular: Normal rate, regular  rhythm and normal heart sounds.   Pulmonary/Chest: Effort normal and breath sounds normal.  Abdominal: Soft. Bowel sounds are normal. She exhibits no distension and no mass. There is tenderness. There is no rebound and no guarding.       Mild tenderness to suprapubic area at midline; no rebound or guarding  Musculoskeletal: Normal range of motion.  Neurological: She is alert.  Skin: Skin is warm and dry. No rash noted. She is not diaphoretic.  Psychiatric: She has a normal mood and affect.    ED Course  Procedures (including critical care time)  Labs Reviewed  URINALYSIS, ROUTINE W REFLEX MICROSCOPIC - Abnormal; Notable for the following:    Hgb urine dipstick LARGE (*)    All other components within normal limits  POCT I-STAT, CHEM 8 - Abnormal; Notable for the following:    Potassium 3.4 (*)    Calcium, Ion 1.09 (*)    All other components within normal limits  POCT PREGNANCY, URINE  URINE MICROSCOPIC-ADD ON  POCT PREGNANCY, URINE  I-STAT, CHEM 8   US Transvaginal Non-ob  10/24/2011  *RADIOLOGY REPORT*  Clinical Data: Vaginal bleeding for 2 days.  Last menstrual period December 4.  TRANSABDOMINAL AND TRANSVAGINAL ULTRASOUND OF PELVIS Technique:  Both transabdominal and transvaginal ultrasound examinations of the pelvis were performed. Transabdominal technique was performed for global imaging of the pelvis including uterus, ovaries, adnexal regions, and pelvic cul-de-sac.  Comparison: 10/07/2010   It was necessary to proceed with endovaginal exam following the transabdominal exam to visualize the uterus, ovaries, and adnexa  .  Findings:  Uterus: 10.4 x 5.7 x 8.7 cm.  An anterior uterine body/fundal intramural fibroid measures 4.1 x 3.2 x 3.1 cm.  A posterior uterine body intramural ill-defined fibroid measures 4.7 x 4.0 x 4.2 cm.  Endometrium: Normal, 6 mm  Right ovary:  3.4 x 2.5 x 2.6 cm.  Follicle versus minimally complex follicle at 2.0 cm.  Left ovary: 3.0 x 2.0 x 2.6 cm.  Poorly  visualized, but felt to be within normal limits.  Other findings: No free fluid.  Exam is mildly degraded by patient body habitus.  IMPRESSION:  1.  Uterine fibroids. 2.  Normal endometrium, without other explanation for vaginal bleeding.  Original Report Authenticated By: Consuello Bossier, M.D.   US Pelvis Complete  10/24/2011  *RADIOLOGY REPORT*  Clinical Data: Vaginal bleeding for 2 days.  Last menstrual period December 4.  TRANSABDOMINAL AND TRANSVAGINAL ULTRASOUND OF PELVIS Technique:  Both transabdominal and transvaginal ultrasound examinations of the pelvis were performed. Transabdominal technique was performed for global imaging of the pelvis including uterus, ovaries, adnexal regions, and pelvic cul-de-sac.  Comparison: 10/07/2010   It was necessary to proceed with endovaginal exam following  the transabdominal exam to visualize the uterus, ovaries, and adnexa  .  Findings:  Uterus: 10.4 x 5.7 x 8.7 cm.  An anterior uterine body/fundal intramural fibroid measures 4.1 x 3.2 x 3.1 cm.  A posterior uterine body intramural ill-defined fibroid measures 4.7 x 4.0 x 4.2 cm.  Endometrium: Normal, 6 mm  Right ovary:  3.4 x 2.5 x 2.6 cm.  Follicle versus minimally complex follicle at 2.0 cm.  Left ovary: 3.0 x 2.0 x 2.6 cm.  Poorly visualized, but felt to be within normal limits.  Other findings: No free fluid.  Exam is mildly degraded by patient body habitus.  IMPRESSION:  1.  Uterine fibroids. 2.  Normal endometrium, without other explanation for vaginal bleeding.  Original Report Authenticated By: Consuello Bossier, M.D.     1. Abnormal vaginal bleeding   2. Fibroid, uterus       MDM  4:29 PM Patient assessed. Given her bleeding, plan to obtain UA, urine pregnancy. We'll also obtain pelvic ultrasound. H/H nl. She does not have evidence of hemodynamic compromise.  U/S shows 2 uterine fibroids which are likely causing her bleeding. Discussed with Dr. Radford Pax. She will need close GYN f/u for definitive tx;  she was instructed to call in AM to make an appt. Discussed reasons to return to ED. She verbalized understanding and agreed to plan.     Grant Fontana, [place] 10/24/11 2145

## 2011-10-24 NOTE — ED Notes (Signed)
Pt given discharge instructions and verbalizes understanding  

## 2011-10-24 NOTE — ED Notes (Signed)
Pt c/o nausea x 2 weeks, abd pain since Monday, heavy vaginal bleeding x 2 days (last menstrual cycle was 10/01/11-10/07/11); taking tramadol at home for the pain; no hx of irregular periods; denies pregnancy

## 2011-10-25 NOTE — ED Provider Notes (Signed)
Medical screening examination/treatment/procedure(s) were performed by non-physician practitioner and as supervising physician I was immediately available for consultation/collaboration.    Shenelle Klas L Ozell Juhasz, MD 10/25/11 1046 

## 2011-10-30 ENCOUNTER — Other Ambulatory Visit: Payer: Self-pay | Admitting: Family Medicine

## 2011-10-30 DIAGNOSIS — Z1231 Encounter for screening mammogram for malignant neoplasm of breast: Secondary | ICD-10-CM

## 2011-11-20 ENCOUNTER — Ambulatory Visit: Payer: Self-pay

## 2012-01-07 ENCOUNTER — Encounter (HOSPITAL_COMMUNITY): Payer: Self-pay | Admitting: Emergency Medicine

## 2012-01-07 ENCOUNTER — Emergency Department (HOSPITAL_COMMUNITY)
Admission: EM | Admit: 2012-01-07 | Discharge: 2012-01-07 | Disposition: A | Discharge status: Home / Self Care | Payer: Self-pay | Attending: Emergency Medicine | Admitting: Emergency Medicine

## 2012-01-07 DIAGNOSIS — R059 Cough, unspecified: Secondary | ICD-10-CM | POA: Insufficient documentation

## 2012-01-07 DIAGNOSIS — R110 Nausea: Secondary | ICD-10-CM | POA: Insufficient documentation

## 2012-01-07 DIAGNOSIS — R05 Cough: Secondary | ICD-10-CM | POA: Insufficient documentation

## 2012-01-07 DIAGNOSIS — I10 Essential (primary) hypertension: Secondary | ICD-10-CM | POA: Insufficient documentation

## 2012-01-07 DIAGNOSIS — J069 Acute upper respiratory infection, unspecified: Secondary | ICD-10-CM | POA: Insufficient documentation

## 2012-01-07 DIAGNOSIS — J45909 Unspecified asthma, uncomplicated: Secondary | ICD-10-CM | POA: Insufficient documentation

## 2012-01-07 DIAGNOSIS — E119 Type 2 diabetes mellitus without complications: Secondary | ICD-10-CM | POA: Insufficient documentation

## 2012-01-07 DIAGNOSIS — F17200 Nicotine dependence, unspecified, uncomplicated: Secondary | ICD-10-CM | POA: Insufficient documentation

## 2012-01-07 DIAGNOSIS — K219 Gastro-esophageal reflux disease without esophagitis: Secondary | ICD-10-CM | POA: Insufficient documentation

## 2012-01-07 DIAGNOSIS — J40 Bronchitis, not specified as acute or chronic: Secondary | ICD-10-CM

## 2012-01-07 MED ORDER — OXYCODONE-ACETAMINOPHEN 5-325 MG PO TABS: 1.0000 | ORAL_TABLET | ORAL | Status: AC | PRN

## 2012-01-07 MED ORDER — PREDNISONE 20 MG PO TABS: 40.0000 mg | ORAL_TABLET | Freq: Every day | ORAL | Status: DC

## 2012-01-07 MED ORDER — AZITHROMYCIN 250 MG PO TABS: 250.0000 mg | ORAL_TABLET | Freq: Every day | ORAL | Status: AC

## 2012-01-07 NOTE — ED Notes (Signed)
Pt states she is coming in tonight for cough and vomiting  Sxs started on Friday but got worse yesterday  Vomiting started yesterday  Pt states she feels tight and is hurting at the base of her ribs on both sides

## 2012-01-07 NOTE — ED Provider Notes (Signed)
History     CSN: [id number]  Arrival date & time 01/07/12  3086   First MD Initiated Contact with Patient 01/07/12 0710      Chief Complaint  Patient presents with  . Cough  . Nausea    (Consider location/radiation/quality/duration/timing/severity/associated sxs/prior treatment) HPI Comments: The patient reports 5 days of a worsening dry cough, sore throat, headache with coughing, and occasional posttussive emesis. The patient reports bilateral anterior lower chest discomfort, dull and aching, with coughing only, nonradiating, moderate in severity. She reports that the chest discomfort started yesterday. She also reports one episode of posttussive emesis yesterday. She denies nasal congestion, ear pain, postnasal drip, abdominal pain, diarrhea, fever or chills. The patient has a history of asthma, hypertension, diabetes.  Patient is a 46 y.o. female presenting with cough. The history is provided by the patient.  Cough This is a new problem. Episode onset: 5 days ago. The problem occurs every few minutes. The problem has been gradually worsening. The cough is non-productive. There has been no fever. Associated symptoms include chest pain, headaches, sore throat and wheezing. Pertinent negatives include no chills, no sweats, no weight loss, no ear congestion, no ear pain, no rhinorrhea, no myalgias, no shortness of breath and no eye redness. She has tried nothing for the symptoms. Risk factors: The patient works in a nursing facility. She is not a smoker. Her past medical history is significant for asthma. Her past medical history does not include pneumonia.    Past Medical History  Diagnosis Date  . Diabetes mellitus   . Hypertension   . Asthma   . GERD (gastroesophageal reflux disease)   . Migraines     Past Surgical History  Procedure Date  . Btl   . Tubal ligation     Family History  Problem Relation Age of Onset  . Diabetes Mother   . Hypertension Mother     History    Substance Use Topics  . Smoking status: Current Everyday Smoker -- 0.5 packs/day    Types: Cigarettes  . Smokeless tobacco: Not on file  . Alcohol Use: No    OB History    Grav Para Term Preterm Abortions TAB SAB Ect Mult Living                  Review of Systems  Constitutional: Positive for fatigue. Negative for fever, chills, weight loss, diaphoresis and activity change.  HENT: Positive for sore throat. Negative for hearing loss, ear pain, congestion, facial swelling, rhinorrhea, sneezing, voice change, postnasal drip and sinus pressure.   Eyes: Negative for redness.  Respiratory: Positive for cough and wheezing. Negative for shortness of breath.   Cardiovascular: Positive for chest pain. Negative for palpitations and leg swelling.  Gastrointestinal: Positive for nausea and vomiting. Negative for abdominal pain and abdominal distention.  Genitourinary: Negative.   Musculoskeletal: Negative for myalgias.  Skin: Negative.   Neurological: Positive for headaches.  Psychiatric/Behavioral: Negative.     Allergies  Vicodin  Home Medications   Current Outpatient Rx  Name Route Sig Dispense Refill  . ACYCLOVIR 200 MG PO CAPS Oral Take 200 mg by mouth 2 (two) times daily.      . ALBUTEROL SULFATE HFA 108 (90 BASE) MCG/ACT IN AERS Inhalation Inhale 2 puffs into the lungs every 6 (six) hours as needed. wheezing    . AMLODIPINE BESYLATE 5 MG PO TABS Oral Take 5 mg by mouth daily.      . ASPIRIN EC 81 MG PO  TBEC Oral Take 81 mg by mouth daily.    . ATORVASTATIN CALCIUM 20 MG PO TABS Oral Take 20 mg by mouth daily.      Marland Kitchen FERROUS SULFATE 325 (65 FE) MG PO TABS Oral Take 325 mg by mouth 2 (two) times daily before a meal.    . FLUTICASONE-SALMETEROL 250-50 MCG/DOSE IN AEPB Inhalation Inhale 1 puff into the lungs every 12 (twelve) hours.      Marland Kitchen LISINOPRIL-HYDROCHLOROTHIAZIDE 20-12.5 MG PO TABS Oral Take 1 tablet by mouth daily.      Marland Kitchen METFORMIN HCL 1000 MG PO TABS Oral Take 1,000 mg by  mouth 2 (two) times daily with a meal.      . NAPROXEN 500 MG PO TABS Oral Take 500 mg by mouth 2 (two) times daily as needed. Pain    . PANTOPRAZOLE SODIUM 40 MG PO TBEC Oral Take 40 mg by mouth daily.        BP 133/88  Pulse 87  Temp(Src) 98.8 F (37.1 C) (Oral)  Resp 20  SpO2 98%  LMP 01/03/2012  Physical Exam  Nursing note and vitals reviewed. Constitutional: She is oriented to person, place, and time. She appears well-developed and well-nourished. No distress.  HENT:  Head: Normocephalic and atraumatic.  Right Ear: Hearing, tympanic membrane, external ear and ear canal normal. Tympanic membrane is not injected.  Left Ear: Hearing, tympanic membrane, external ear and ear canal normal. Tympanic membrane is not injected.  Nose: Nose normal. Right sinus exhibits no maxillary sinus tenderness and no frontal sinus tenderness. Left sinus exhibits no maxillary sinus tenderness and no frontal sinus tenderness.  Mouth/Throat: Uvula is midline, oropharynx is clear and moist and mucous membranes are normal.  Eyes: Conjunctivae and EOM are normal. Pupils are equal, round, and reactive to light.  Neck: Normal range of motion. Neck supple. No JVD present. No tracheal deviation present.  Cardiovascular: Normal rate, regular rhythm, normal heart sounds and intact distal pulses.  Exam reveals no gallop and no friction rub.   No murmur heard. Pulmonary/Chest: Effort normal. No accessory muscle usage or stridor. Not tachypneic. No respiratory distress. She has no decreased breath sounds. She has wheezes in the right upper field, the right middle field, the left upper field and the left middle field. She has no rhonchi. She has no rales. She exhibits no tenderness.  Abdominal: Soft. Bowel sounds are normal. She exhibits no distension. There is no tenderness. There is no rebound and no guarding.  Musculoskeletal: Normal range of motion. She exhibits no edema and no tenderness.  Lymphadenopathy:    She  has no cervical adenopathy.  Neurological: She is alert and oriented to person, place, and time. She has normal reflexes. No cranial nerve deficit. She exhibits normal muscle tone. Coordination normal.  Skin: Skin is warm and dry. No rash noted. She is not diaphoretic. No erythema. No pallor.  Psychiatric: She has a normal mood and affect. Her behavior is normal. Judgment and thought content normal.    ED Course  Procedures (including critical care time)  Labs Reviewed - No data to display No results found.   No diagnosis found.    MDM  Viral upper respiratory tract infection with cough, musculoskeletal chest pain, bronchitis, pneumonia are all considered in the patient's differential diagnosis amongst other etiologies. I do not hear rales on the lung examination to suggest pneumonia, and her pulse oximetry reading and respirations are normal. She does have wheezing and I expect that she has a  bronchitis. I will initiate treatment with antibiotic, steroid, and narcotic for cough suppression in analgesia. The patient has enough of her inhalers at home she says. I directed her to followup with her primary care physician in 3-5 days if symptoms persist without improvement.        Felisa Bonier, MD 01/07/12 [phone]

## 2012-01-14 ENCOUNTER — Ambulatory Visit: Payer: Self-pay

## 2012-01-15 ENCOUNTER — Ambulatory Visit
Admission: RE | Admit: 2012-01-15 | Discharge: 2012-01-15 | Disposition: A | Discharge status: Home / Self Care | Payer: Self-pay | Source: Ambulatory Visit | Attending: Family Medicine | Admitting: Family Medicine

## 2012-01-15 DIAGNOSIS — Z1231 Encounter for screening mammogram for malignant neoplasm of breast: Secondary | ICD-10-CM

## 2012-02-04 ENCOUNTER — Emergency Department (HOSPITAL_COMMUNITY)
Admission: EM | Admit: 2012-02-04 | Discharge: 2012-02-04 | Disposition: A | Discharge status: Home / Self Care | Payer: Self-pay | Attending: Emergency Medicine | Admitting: Emergency Medicine

## 2012-02-04 ENCOUNTER — Encounter (HOSPITAL_COMMUNITY): Payer: Self-pay

## 2012-02-04 DIAGNOSIS — H53149 Visual discomfort, unspecified: Secondary | ICD-10-CM | POA: Insufficient documentation

## 2012-02-04 DIAGNOSIS — R51 Headache: Secondary | ICD-10-CM

## 2012-02-04 DIAGNOSIS — E119 Type 2 diabetes mellitus without complications: Secondary | ICD-10-CM | POA: Insufficient documentation

## 2012-02-04 DIAGNOSIS — I10 Essential (primary) hypertension: Secondary | ICD-10-CM | POA: Insufficient documentation

## 2012-02-04 DIAGNOSIS — Z79899 Other long term (current) drug therapy: Secondary | ICD-10-CM | POA: Insufficient documentation

## 2012-02-04 DIAGNOSIS — R42 Dizziness and giddiness: Secondary | ICD-10-CM | POA: Insufficient documentation

## 2012-02-04 MED ORDER — METOCLOPRAMIDE HCL 5 MG/ML IJ SOLN
10.0000 mg | Freq: Once | INTRAMUSCULAR | Status: AC
  Administered 2012-02-04: 10 mg via INTRAMUSCULAR
  Filled 2012-02-04: qty 2

## 2012-02-04 MED ORDER — DIPHENHYDRAMINE HCL 50 MG/ML IJ SOLN
25.0000 mg | Freq: Once | INTRAMUSCULAR | Status: AC
  Administered 2012-02-04: 25 mg via INTRAMUSCULAR
  Filled 2012-02-04: qty 1

## 2012-02-04 NOTE — Discharge Instructions (Signed)
Please read and follow all provided instructions.  Your diagnoses today include:  1. Headache     Tests performed today include:  Vital signs. See below for your results today.   Medications:  In the Emergency Department you received:  Reglan - antinausea/headache medication  Benadryl - antihistamine to counteract potential side effects of reglan  Additional information:  Follow any educational materials contained in this packet.  You are having a headache. No specific cause was found today for your headache. It may have been a migraine or other cause of headache. Stress, anxiety, fatigue, and depression are common triggers for headaches.   Your headache today does not appear to be life-threatening or require hospitalization, but often the exact cause of headaches is not determined in the emergency department. Therefore, follow-up with your doctor is very important to find out what may have caused your headache and whether or not you need any further diagnostic testing or treatment.   Sometimes headaches can appear benign (not harmful), but then more serious symptoms can develop which should prompt an immediate re-evaluation by your doctor or the emergency department.  BE VERY CAREFUL not to take multiple medicines containing Tylenol (also called acetaminophen). Doing so can lead to an overdose which can damage your liver and cause liver failure and possibly death.   Follow-up instructions: Please follow-up with your primary care provider in the next 3 days for further evaluation of your symptoms. If you do not have a primary care doctor -- see below for referral information.   Return instructions:   Please return to the Emergency Department if you experience worsening symptoms.  Return if the medications do not resolve your headache, if it recurs, or if you have multiple episodes of vomiting or cannot keep down fluids.  Return if you have a change from the usual  headache.  RETURN IMMEDIATELY IF you:  Develop a sudden, severe headache  Develop confusion or become poorly responsive or faint  Develop a fever above 100.61F or problem breathing  Have a change in speech, vision, swallowing, or understanding  Develop new weakness, numbness, tingling, incoordination in your arms or legs  Have a seizure  Please return if you have any other emergent concerns.  Additional Information:  Your vital signs today were: BP 107/69  Pulse 88  Temp(Src) 98.6 F (37 C) (Oral)  Resp 18  SpO2 95%  LMP 01/03/2012 If your blood pressure (BP) was elevated above 135/85 this visit, please have this repeated by your doctor within one month. -------------- No Primary Care Doctor Call [facility]  [phone] Other agencies that provide inexpensive medical care    [facility]  [phone]    [facility]  [phone]    [facility]  [phone]    [facility]  [phone]    [facility]  [phone]    [facility]  [phone] -------------- RESOURCE GUIDE:  Dental Problems  Patients with Medicaid: [facility] [phone]                                            [phone] [address] [employer] [address] Phone:  [phone]  Phone:  [phone]  If unable to pay or uninsured, contact:  [facility] or [facility]. to become qualified for the adult dental clinic.  Chronic Pain Problems Contact [facility]  [phone] Patients need to be referred by their primary care doctor.  Insufficient Money for Medicine Contact [employer]:  call "211" or [facility] [phone].  Psychological Services [facility]  [phone] [facility]  [phone] [facility]   [phone] (emergency services [phone])  Substance Abuse Resources [facility]   [phone] [facility] [phone] [facility] [phone] Floydene Flock [phone] Residential & Outpatient Substance Abuse Program  [phone]  Abuse/Neglect [facility] Child Abuse Hotline [phone] [facility] Child Abuse Hotline [phone] (After Hours)  Emergency Shelter [facility] [phone]  M[facility] Homes [facility] [phone] [facility] [phone]  [facility] Resources  [facility]     [employer]                          [facility] Dept. [address] [place]                       [address]      [address]  Blondell Reveal Phone:  [phone]                                   Phone:  [phone]                 Phone:  [phone]  [facility] Phone:  [phone]  [facility] Child Abuse Hotline [phone]) [phone] (After Hours)

## 2012-02-04 NOTE — ED Notes (Signed)
Patient reports that she developed blurred vision this am while at work. Taking her BP meds daily as prescribed

## 2012-02-04 NOTE — ED Provider Notes (Signed)
History     CSN: [id number]  Arrival date & time 02/04/12  0848   First MD Initiated Contact with Patient 02/04/12 [phone]      Chief Complaint  Patient presents with  . Hypertension  . Migraine    (Consider location/radiation/quality/duration/timing/severity/associated sxs/prior treatment) HPI Comments: Patient with history of migraine headaches -- presents this morning with her typical bilateral headache. Patient awoke with the headache and it is gradually worsening. While the patient was at work she developed blurred vision and lightheadedness. This is typical for her headaches. She checked her blood pressure and found that it was in the 140s. Patient was brought to the emergency room for evaluation. She has received migraine cocktails in the past which has helped. The headache is worse with bright light and loud sound. The patient denies recent head injuries, fever, vomiting. She denies sinus congestion or toothaches. No treatments prior to arrival.  Patient is a 46 y.o. female presenting with migraine. The history is provided by the patient.  Migraine This is a recurrent problem. The current episode started today. The problem occurs constantly. The problem has been gradually worsening. Associated symptoms include headaches and a visual change. Pertinent negatives include no coughing, fever, nausea, neck pain, numbness, vomiting or weakness. Exacerbated by: light. She has tried nothing for the symptoms.    Past Medical History  Diagnosis Date  . Diabetes mellitus   . Hypertension   . Asthma   . GERD (gastroesophageal reflux disease)   . Migraines     Past Surgical History  Procedure Date  . Btl   . Tubal ligation     Family History  Problem Relation Age of Onset  . Diabetes Mother   . Hypertension Mother     History  Substance Use Topics  . Smoking status: Current Everyday Smoker -- 0.5 packs/day    Types: Cigarettes  . Smokeless tobacco: Not on file  . Alcohol Use:  No    OB History    Grav Para Term Preterm Abortions TAB SAB Ect Mult Living                  Review of Systems  Constitutional: Negative for fever.  HENT: Negative for neck pain and sinus pressure.   Eyes: Positive for photophobia. Negative for pain and visual disturbance.  Respiratory: Negative for cough.   Gastrointestinal: Negative for nausea and vomiting.  Musculoskeletal: Negative for back pain and gait problem.  Skin: Negative for wound.  Neurological: Positive for light-headedness and headaches. Negative for dizziness, syncope, speech difficulty, weakness and numbness.  Psychiatric/Behavioral: Negative for confusion and decreased concentration.    Allergies  Vicodin  Home Medications   Current Outpatient Rx  Name Route Sig Dispense Refill  . ACYCLOVIR 200 MG PO CAPS Oral Take 200 mg by mouth 2 (two) times daily.      . ALBUTEROL SULFATE HFA 108 (90 BASE) MCG/ACT IN AERS Inhalation Inhale 2 puffs into the lungs every 6 (six) hours as needed. wheezing    . AMLODIPINE BESYLATE 5 MG PO TABS Oral Take 5 mg by mouth daily.      . ASPIRIN EC 81 MG PO TBEC Oral Take 81 mg by mouth daily.    . ATORVASTATIN CALCIUM 20 MG PO TABS Oral Take 20 mg by mouth daily.      Marland Kitchen FERROUS SULFATE 325 (65 FE) MG PO TABS Oral Take 325 mg by mouth 2 (two) times daily before a meal.    . FLUTICASONE-SALMETEROL  250-50 MCG/DOSE IN AEPB Inhalation Inhale 1 puff into the lungs every 12 (twelve) hours.      Marland Kitchen LISINOPRIL-HYDROCHLOROTHIAZIDE 20-12.5 MG PO TABS Oral Take 1 tablet by mouth daily.      Marland Kitchen METFORMIN HCL 1000 MG PO TABS Oral Take 1,000 mg by mouth 2 (two) times daily with a meal.      . NAPROXEN 500 MG PO TABS Oral Take 500 mg by mouth 2 (two) times daily as needed. Pain    . PANTOPRAZOLE SODIUM 40 MG PO TBEC Oral Take 40 mg by mouth daily.      Marland Kitchen PREDNISONE 20 MG PO TABS Oral Take 2 tablets (40 mg total) by mouth daily. 10 tablet 0    BP 107/69  Pulse 88  Temp(Src) 98.6 F (37 C) (Oral)   Resp 18  SpO2 95%  LMP 01/03/2012  Physical Exam  Nursing note and vitals reviewed. Constitutional: She is oriented to person, place, and time. She appears well-developed and well-nourished.  HENT:  Head: Normocephalic and atraumatic.  Right Ear: External ear normal.  Left Ear: External ear normal.  Mouth/Throat: Oropharynx is clear and moist.  Eyes: Conjunctivae are normal. Pupils are equal, round, and reactive to light. Right eye exhibits no discharge. Left eye exhibits no discharge.  Neck: Normal range of motion. Neck supple.  Cardiovascular: Normal rate, regular rhythm and normal heart sounds.   Pulmonary/Chest: Effort normal and breath sounds normal.  Abdominal: Soft. There is no tenderness.  Musculoskeletal: Normal range of motion.  Lymphadenopathy:    She has no cervical adenopathy.  Neurological: She is alert and oriented to person, place, and time. She has normal strength. No cranial nerve deficit or sensory deficit. Coordination normal. GCS eye subscore is 4. GCS verbal subscore is 5. GCS motor subscore is 6.  Skin: Skin is warm and dry.  Psychiatric: She has a normal mood and affect.    ED Course  Procedures (including critical care time)  Labs Reviewed - No data to display No results found.   1. Headache     10:23 AM Patient seen and examined. Medications ordered.   Vital signs reviewed and are as follows: Filed Vitals:   02/04/12 0904  BP: 107/69  Pulse:   Temp:   Resp:    BP 107/69  Pulse 88  Temp(Src) 98.6 F (37 C) (Oral)  Resp 18  SpO2 95%  LMP 01/03/2012  10:29 AM Migraine cocktail ordered. Anticipate d/c to home. Patient urged to return with worsening severe headache, symptoms that are unusual for her, trouble walking, difficulty speaking, loss of vision, and he or she has any other concerns. Patient verbalizes understanding and agrees with plan.  Patient seen just prior to discharge. Questions answered. She appears well and is stable for  discharge.   MDM  Patient with typical migraine headache symptoms. No head injury. Normal neurological examination. Patient appears well and comfortable. No suspicion for concerning intracranial etiology at this point.        Renne Crigler, [place] 02/04/12 [phone]

## 2012-02-04 NOTE — ED Notes (Signed)
Pt believe her BP is elevated due to blurry vision onset this morning. BP normal in ED. Also reports moderate headache 5/10, with nausea, and photosensitivity. Pt has hx of Migraine, HTN (takes meds regularly), and DM (blood sugar 86 this morning).

## 2012-02-06 NOTE — ED Provider Notes (Signed)
Medical screening examination/treatment/procedure(s) were performed by non-physician practitioner and as supervising physician I was immediately available for consultation/collaboration.  Cyndra Numbers, MD 02/06/12 1320

## 2012-05-29 ENCOUNTER — Emergency Department (HOSPITAL_COMMUNITY)
Admission: EM | Admit: 2012-05-29 | Discharge: 2012-05-29 | Disposition: A | Discharge status: Home / Self Care | Payer: Self-pay | Attending: Emergency Medicine | Admitting: Emergency Medicine

## 2012-05-29 ENCOUNTER — Encounter (HOSPITAL_COMMUNITY): Payer: Self-pay | Admitting: Family Medicine

## 2012-05-29 DIAGNOSIS — Z886 Allergy status to analgesic agent status: Secondary | ICD-10-CM | POA: Insufficient documentation

## 2012-05-29 DIAGNOSIS — X58XXXA Exposure to other specified factors, initial encounter: Secondary | ICD-10-CM | POA: Insufficient documentation

## 2012-05-29 DIAGNOSIS — Z7982 Long term (current) use of aspirin: Secondary | ICD-10-CM | POA: Insufficient documentation

## 2012-05-29 DIAGNOSIS — F17200 Nicotine dependence, unspecified, uncomplicated: Secondary | ICD-10-CM | POA: Insufficient documentation

## 2012-05-29 DIAGNOSIS — J45909 Unspecified asthma, uncomplicated: Secondary | ICD-10-CM | POA: Insufficient documentation

## 2012-05-29 DIAGNOSIS — E119 Type 2 diabetes mellitus without complications: Secondary | ICD-10-CM | POA: Insufficient documentation

## 2012-05-29 DIAGNOSIS — Z79899 Other long term (current) drug therapy: Secondary | ICD-10-CM | POA: Insufficient documentation

## 2012-05-29 DIAGNOSIS — I10 Essential (primary) hypertension: Secondary | ICD-10-CM | POA: Insufficient documentation

## 2012-05-29 DIAGNOSIS — L509 Urticaria, unspecified: Secondary | ICD-10-CM | POA: Insufficient documentation

## 2012-05-29 DIAGNOSIS — K219 Gastro-esophageal reflux disease without esophagitis: Secondary | ICD-10-CM | POA: Insufficient documentation

## 2012-05-29 DIAGNOSIS — T7840XA Allergy, unspecified, initial encounter: Secondary | ICD-10-CM | POA: Insufficient documentation

## 2012-05-29 DIAGNOSIS — G43909 Migraine, unspecified, not intractable, without status migrainosus: Secondary | ICD-10-CM | POA: Insufficient documentation

## 2012-05-29 MED ORDER — LORATADINE 10 MG PO TABS: 10.0000 mg | ORAL_TABLET | Freq: Every day | ORAL | Status: DC

## 2012-05-29 MED ORDER — PREDNISONE 20 MG PO TABS: 40.0000 mg | ORAL_TABLET | Freq: Every day | ORAL | Status: DC

## 2012-05-29 MED ORDER — METHYLPREDNISOLONE SODIUM SUCC 125 MG IJ SOLR
125.0000 mg | Freq: Once | INTRAMUSCULAR | Status: AC
  Administered 2012-05-29: 125 mg via INTRAVENOUS
  Filled 2012-05-29: qty 2

## 2012-05-29 MED ORDER — FAMOTIDINE IN NACL 20-0.9 MG/50ML-% IV SOLN
20.0000 mg | Freq: Once | INTRAVENOUS | Status: AC
  Administered 2012-05-29: 20 mg via INTRAVENOUS
  Filled 2012-05-29: qty 50

## 2012-05-29 NOTE — ED Notes (Signed)
Patient states that around 9pm she started having right facial numbness accompanied by hives and feeling like she couldn't swallow. Denies shortness of breath.

## 2012-05-29 NOTE — ED Notes (Signed)
Pt reports eating berry salad tonight at 9pm and began to have hives, throat pain and chest pain, non radiating.

## 2012-05-29 NOTE — ED Provider Notes (Addendum)
History     CSN: [id number]  Arrival date & time 05/29/12  0221   First MD Initiated Contact with Patient 05/29/12 [phone]      Chief Complaint  Patient presents with  . Numbness  . Urticaria    (Consider location/radiation/quality/duration/timing/severity/associated sxs/prior treatment) HPI Comments: Patient states, that shortly after she ate a salad from [facility], which consisted of strawberries blueberries, and almonds.  She noticed right facial numbness.  Hives on her forearms, and anterior chest, and difficulty swallowing.  She took one of her Protonix, thinking that her GERD had flared up, but her symptoms have not really improved  Patient is a 46 y.o. female presenting with urticaria. The history is provided by the patient.  Urticaria This is a new problem. The current episode started today. The problem has been unchanged. Associated symptoms include chest pain, numbness and a rash. Pertinent negatives include no abdominal pain, congestion, fever, nausea, neck pain, sore throat or weakness.    Past Medical History  Diagnosis Date  . Diabetes mellitus   . Hypertension   . Asthma   . GERD (gastroesophageal reflux disease)   . Migraines     Past Surgical History  Procedure Date  . Btl   . Tubal ligation     Family History  Problem Relation Age of Onset  . Diabetes Mother   . Hypertension Mother     History  Substance Use Topics  . Smoking status: Current Everyday Smoker -- 0.2 packs/day    Types: Cigarettes  . Smokeless tobacco: Not on file  . Alcohol Use: No    OB History    Grav Para Term Preterm Abortions TAB SAB Ect Mult Living                  Review of Systems  Constitutional: Negative for fever.  HENT: Positive for trouble swallowing. Negative for congestion, sore throat and neck pain.   Cardiovascular: Positive for chest pain.  Gastrointestinal: Negative for nausea and abdominal pain.  Skin: Positive for rash.  Neurological: Positive for  numbness. Negative for dizziness and weakness.    Allergies  Vicodin  Home Medications   Current Outpatient Rx  Name Route Sig Dispense Refill  . ACYCLOVIR 200 MG PO CAPS Oral Take 200 mg by mouth 2 (two) times daily.      . ALBUTEROL SULFATE HFA 108 (90 BASE) MCG/ACT IN AERS Inhalation Inhale 2 puffs into the lungs every 6 (six) hours as needed. wheezing    . AMLODIPINE BESYLATE 5 MG PO TABS Oral Take 5 mg by mouth daily.      . ASPIRIN EC 81 MG PO TBEC Oral Take 81 mg by mouth daily.    . ATORVASTATIN CALCIUM 20 MG PO TABS Oral Take 20 mg by mouth daily.      Marland Kitchen FERROUS SULFATE 325 (65 FE) MG PO TABS Oral Take 325 mg by mouth 2 (two) times daily before a meal.    . FLUTICASONE-SALMETEROL 250-50 MCG/DOSE IN AEPB Inhalation Inhale 1 puff into the lungs every 12 (twelve) hours.      Marland Kitchen LISINOPRIL-HYDROCHLOROTHIAZIDE 20-12.5 MG PO TABS Oral Take 1 tablet by mouth daily.      Marland Kitchen METFORMIN HCL 1000 MG PO TABS Oral Take 1,000 mg by mouth 2 (two) times daily with a meal.      . NAPROXEN 500 MG PO TABS Oral Take 500 mg by mouth 2 (two) times daily as needed. Pain    . PANTOPRAZOLE SODIUM 40  MG PO TBEC Oral Take 40 mg by mouth daily.        BP 108/65  Pulse 83  Temp 97.7 F (36.5 C) (Oral)  Resp 14  SpO2 99%  LMP 05/21/2012  Physical Exam  Constitutional: She appears well-developed and well-nourished.  HENT:  Head: Normocephalic.  Left Ear: External ear normal.  Mouth/Throat: Uvula is midline and mucous membranes are normal. No uvula swelling.  Neck: Normal range of motion.  Cardiovascular: Normal rate.   Pulmonary/Chest: Effort normal. She has no wheezes. She exhibits no tenderness.  Abdominal: Soft. She exhibits no distension.  Musculoskeletal: Normal range of motion.  Neurological: She is alert.    ED Course  Procedures (including critical care time)  Labs Reviewed - No data to display No results found.   No diagnosis found.  ED ECG REPORT   Date: 07/30/2012  EKG Time:  8:32 PM  Rate: 96  Rhythm: normal sinus rhythm,  there are no previous tracings available for comparison  Axis: normal  Intervals:poor R wave progrression   ST&T Change: none  Narrative Interpretation: abnormal             MDM   It is most likely an allergic reaction to something that was in a salad.  Will treat with Solu-Medrol, Pepcid, and reevaluate        Arman Filter, NP 05/29/12 1610  Arman Filter, NP 07/30/12 2033

## 2012-05-29 NOTE — ED Provider Notes (Signed)
Medical screening examination/treatment/procedure(s) were performed by non-physician practitioner and as supervising physician I was immediately available for consultation/collaboration.  Jasmine Awe, MD 05/29/12 [phone]

## 2012-05-29 NOTE — ED Provider Notes (Signed)
I assumed care of patient from Sabino Dick, NP  Patient having mild allergic reaction with urticaria, without angioedema or respiratory involvement. Given IV benadryl, fluids, 125mg  IV solumedrol  Pt monitored for 4 hours total, feeling much better, hives have resolved, no wheezing or angioedema.  Pt safe to dc. Given Rx for prednisone, benadryl, and Claritin.   Dx: Allergic Reaction  Pt has been advised of the symptoms that warrant their return to the ED. Patient has voiced understanding and has agreed to follow-up with the PCP or specialist.   Dorthula Matas, PA 05/29/12 [phone]

## 2012-05-30 NOTE — ED Provider Notes (Signed)
Medical screening examination/treatment/procedure(s) were performed by non-physician practitioner and as supervising physician I was immediately available for consultation/collaboration.  Jasmine Awe, MD 05/30/12 [phone]

## 2012-07-02 ENCOUNTER — Inpatient Hospital Stay (HOSPITAL_COMMUNITY)
Admission: EM | Admit: 2012-07-02 | Discharge: 2012-07-04 | DRG: 189 | Disposition: A | Discharge status: Home / Self Care | Payer: MEDICAID | Attending: Family Medicine | Admitting: Family Medicine

## 2012-07-02 ENCOUNTER — Emergency Department (HOSPITAL_COMMUNITY): Payer: Self-pay

## 2012-07-02 ENCOUNTER — Encounter (HOSPITAL_COMMUNITY): Payer: Self-pay | Admitting: Emergency Medicine

## 2012-07-02 DIAGNOSIS — E785 Hyperlipidemia, unspecified: Secondary | ICD-10-CM | POA: Diagnosis present

## 2012-07-02 DIAGNOSIS — K219 Gastro-esophageal reflux disease without esophagitis: Secondary | ICD-10-CM | POA: Diagnosis present

## 2012-07-02 DIAGNOSIS — J45901 Unspecified asthma with (acute) exacerbation: Secondary | ICD-10-CM | POA: Diagnosis present

## 2012-07-02 DIAGNOSIS — F17200 Nicotine dependence, unspecified, uncomplicated: Secondary | ICD-10-CM | POA: Diagnosis present

## 2012-07-02 DIAGNOSIS — E669 Obesity, unspecified: Secondary | ICD-10-CM | POA: Diagnosis present

## 2012-07-02 DIAGNOSIS — I10 Essential (primary) hypertension: Secondary | ICD-10-CM | POA: Diagnosis present

## 2012-07-02 DIAGNOSIS — E119 Type 2 diabetes mellitus without complications: Secondary | ICD-10-CM | POA: Diagnosis present

## 2012-07-02 DIAGNOSIS — J9600 Acute respiratory failure, unspecified whether with hypoxia or hypercapnia: Principal | ICD-10-CM | POA: Diagnosis present

## 2012-07-02 DIAGNOSIS — J45909 Unspecified asthma, uncomplicated: Secondary | ICD-10-CM | POA: Diagnosis present

## 2012-07-02 DIAGNOSIS — D509 Iron deficiency anemia, unspecified: Secondary | ICD-10-CM | POA: Diagnosis present

## 2012-07-02 LAB — POCT I-STAT, CHEM 8
BUN: 5 mg/dL — ABNORMAL LOW (ref 6–23)
Calcium, Ion: 1.18 mmol/L (ref 1.12–1.23)
Chloride: 101 mEq/L (ref 96–112)
Creatinine, Ser: 0.7 mg/dL (ref 0.50–1.10)
Glucose, Bld: 104 mg/dL — ABNORMAL HIGH (ref 70–99)
HCT: 41 % (ref 36.0–46.0)
Hemoglobin: 13.9 g/dL (ref 12.0–15.0)
Potassium: 3.4 mEq/L — ABNORMAL LOW (ref 3.5–5.1)
Sodium: 143 mEq/L (ref 135–145)
TCO2: 27 mmol/L (ref 0–100)

## 2012-07-02 LAB — COMPREHENSIVE METABOLIC PANEL
ALT: 9 U/L (ref 0–35)
AST: 16 U/L (ref 0–37)
Albumin: 3.8 g/dL (ref 3.5–5.2)
Alkaline Phosphatase: 80 U/L (ref 39–117)
BUN: 7 mg/dL (ref 6–23)
CO2: 29 mEq/L (ref 19–32)
Calcium: 9.5 mg/dL (ref 8.4–10.5)
Chloride: 100 mEq/L (ref 96–112)
Creatinine, Ser: 0.51 mg/dL (ref 0.50–1.10)
GFR calc Af Amer: >90 mL/min (ref >90)
GFR calc non Af Amer: >90 mL/min (ref >90)
Glucose, Bld: 101 mg/dL — ABNORMAL HIGH (ref 70–99)
Potassium: 3.6 mEq/L (ref 3.5–5.1)
Sodium: 139 mEq/L (ref 135–145)
Total Bilirubin: 0.2 mg/dL — ABNORMAL LOW (ref 0.3–1.2)
Total Protein: 8 g/dL (ref 6.0–8.3)

## 2012-07-02 LAB — CBC WITH DIFFERENTIAL/PLATELET
Basophils Absolute: 0 10*3/uL (ref 0.0–0.1)
Basophils Relative: 0 % (ref 0–1)
Eosinophils Absolute: 0.3 10*3/uL (ref 0.0–0.7)
Eosinophils Relative: 4 % (ref 0–5)
HCT: 37.9 % (ref 36.0–46.0)
Hemoglobin: 12.5 g/dL (ref 12.0–15.0)
Lymphocytes Relative: 18 % (ref 12–46)
Lymphs Abs: 1.2 10*3/uL (ref 0.7–4.0)
MCH: 29.5 pg (ref 26.0–34.0)
MCHC: 33 g/dL (ref 30.0–36.0)
MCV: 89.4 fL (ref 78.0–100.0)
Monocytes Absolute: 0.5 10*3/uL (ref 0.1–1.0)
Monocytes Relative: 7 % (ref 3–12)
Neutro Abs: 4.8 10*3/uL (ref 1.7–7.7)
Neutrophils Relative %: 71 % (ref 43–77)
Platelets: 309 10*3/uL (ref 150–400)
RBC: 4.24 MIL/uL (ref 3.87–5.11)
RDW: 15.8 % — ABNORMAL HIGH (ref 11.5–15.5)
WBC: 6.7 10*3/uL (ref 4.0–10.5)

## 2012-07-02 LAB — PROTIME-INR
INR: 1.05 (ref 0.00–1.49)
Prothrombin Time: 13.9 seconds (ref 11.6–15.2)

## 2012-07-02 LAB — GLUCOSE, CAPILLARY: Glucose-Capillary: 187 mg/dL — ABNORMAL HIGH (ref 70–99)

## 2012-07-02 LAB — HIV ANTIBODY (ROUTINE TESTING W REFLEX): HIV: NONREACTIVE

## 2012-07-02 LAB — STREP PNEUMONIAE URINARY ANTIGEN: Strep Pneumo Urinary Antigen: NEGATIVE

## 2012-07-02 LAB — MAGNESIUM: Magnesium: 1.8 mg/dL (ref 1.5–2.5)

## 2012-07-02 LAB — MRSA PCR SCREENING: MRSA by PCR: NEGATIVE

## 2012-07-02 LAB — APTT: aPTT: 43 seconds — ABNORMAL HIGH (ref 24–37)

## 2012-07-02 LAB — PHOSPHORUS: Phosphorus: 3 mg/dL (ref 2.3–4.6)

## 2012-07-02 MED ORDER — DEXTROSE 5 % IV SOLN
500.0000 mg | Freq: Once | INTRAVENOUS | Status: AC
  Administered 2012-07-02: 500 mg via INTRAVENOUS
  Filled 2012-07-02: qty 500

## 2012-07-02 MED ORDER — FLUTICASONE-SALMETEROL 250-50 MCG/DOSE IN AEPB
1.0000 | INHALATION_SPRAY | Freq: Two times a day (BID) | RESPIRATORY_TRACT | Status: DC
  Administered 2012-07-02 – 2012-07-04 (×3): 1 via RESPIRATORY_TRACT
  Filled 2012-07-02: qty 14

## 2012-07-02 MED ORDER — SODIUM CHLORIDE 0.9 % IV BOLUS (SEPSIS)
700.0000 mL | Freq: Once | INTRAVENOUS | Status: AC
  Administered 2012-07-02: 700 mL via INTRAVENOUS

## 2012-07-02 MED ORDER — INSULIN ASPART 100 UNIT/ML ~~LOC~~ SOLN
0.0000 [IU] | Freq: Three times a day (TID) | SUBCUTANEOUS | Status: DC
  Administered 2012-07-02: 4 [IU] via SUBCUTANEOUS
  Administered 2012-07-03: 3 [IU] via SUBCUTANEOUS

## 2012-07-02 MED ORDER — ONDANSETRON HCL 4 MG/2ML IJ SOLN: 4.0000 mg | Freq: Four times a day (QID) | INTRAMUSCULAR | Status: DC | PRN

## 2012-07-02 MED ORDER — ALBUTEROL SULFATE (5 MG/ML) 0.5% IN NEBU: 2.5000 mg | INHALATION_SOLUTION | RESPIRATORY_TRACT | Status: DC | PRN

## 2012-07-02 MED ORDER — IPRATROPIUM BROMIDE 0.02 % IN SOLN
0.5000 mg | Freq: Once | RESPIRATORY_TRACT | Status: AC
  Administered 2012-07-02: 0.5 mg via RESPIRATORY_TRACT
  Filled 2012-07-02: qty 2.5

## 2012-07-02 MED ORDER — DEXTROSE 5 % IV SOLN
1.0000 g | Freq: Once | INTRAVENOUS | Status: AC
  Administered 2012-07-02: 1 g via INTRAVENOUS
  Filled 2012-07-02: qty 10

## 2012-07-02 MED ORDER — ASPIRIN EC 81 MG PO TBEC
81.0000 mg | DELAYED_RELEASE_TABLET | Freq: Every day | ORAL | Status: DC
  Administered 2012-07-02 – 2012-07-04 (×3): 81 mg via ORAL
  Filled 2012-07-02 (×3): qty 1

## 2012-07-02 MED ORDER — METFORMIN HCL 500 MG PO TABS
1000.0000 mg | ORAL_TABLET | Freq: Two times a day (BID) | ORAL | Status: DC
  Administered 2012-07-02 – 2012-07-04 (×4): 1000 mg via ORAL
  Filled 2012-07-02 (×6): qty 2

## 2012-07-02 MED ORDER — ALBUTEROL (5 MG/ML) CONTINUOUS INHALATION SOLN
15.0000 mg/h | INHALATION_SOLUTION | Freq: Once | RESPIRATORY_TRACT | Status: AC
  Administered 2012-07-02: 15 mg/h via RESPIRATORY_TRACT

## 2012-07-02 MED ORDER — METHYLPREDNISOLONE SODIUM SUCC 125 MG IJ SOLR
60.0000 mg | Freq: Two times a day (BID) | INTRAMUSCULAR | Status: DC
  Administered 2012-07-02: 60 mg via INTRAVENOUS
  Filled 2012-07-02 (×3): qty 0.96

## 2012-07-02 MED ORDER — MAGNESIUM SULFATE 50 % IJ SOLN: 1.0000 g | Freq: Once | INTRAMUSCULAR | Status: DC

## 2012-07-02 MED ORDER — FERROUS SULFATE 325 (65 FE) MG PO TABS
325.0000 mg | ORAL_TABLET | Freq: Two times a day (BID) | ORAL | Status: DC
  Administered 2012-07-02 – 2012-07-04 (×4): 325 mg via ORAL
  Filled 2012-07-02 (×7): qty 1

## 2012-07-02 MED ORDER — MAGNESIUM SULFATE IN D5W 10-5 MG/ML-% IV SOLN
1.0000 g | Freq: Once | INTRAVENOUS | Status: AC
  Administered 2012-07-02: 1 g via INTRAVENOUS
  Filled 2012-07-02: qty 100

## 2012-07-02 MED ORDER — IPRATROPIUM BROMIDE HFA 17 MCG/ACT IN AERS: 2.0000 | INHALATION_SPRAY | Freq: Once | RESPIRATORY_TRACT | Status: DC

## 2012-07-02 MED ORDER — ATORVASTATIN CALCIUM 20 MG PO TABS
20.0000 mg | ORAL_TABLET | Freq: Every day | ORAL | Status: DC
  Administered 2012-07-02 – 2012-07-03 (×2): 20 mg via ORAL
  Filled 2012-07-02 (×3): qty 1

## 2012-07-02 MED ORDER — HYDROCHLOROTHIAZIDE 12.5 MG PO CAPS
12.5000 mg | ORAL_CAPSULE | Freq: Every day | ORAL | Status: DC
  Administered 2012-07-02 – 2012-07-04 (×3): 12.5 mg via ORAL
  Filled 2012-07-02 (×3): qty 1

## 2012-07-02 MED ORDER — SODIUM CHLORIDE 0.9 % IV SOLN
INTRAVENOUS | Status: DC
  Administered 2012-07-02: 11:00:00 via INTRAVENOUS

## 2012-07-02 MED ORDER — ALBUTEROL SULFATE (5 MG/ML) 0.5% IN NEBU
INHALATION_SOLUTION | RESPIRATORY_TRACT | Status: AC
  Filled 2012-07-02: qty 2

## 2012-07-02 MED ORDER — LISINOPRIL-HYDROCHLOROTHIAZIDE 20-12.5 MG PO TABS: 1.0000 | ORAL_TABLET | Freq: Every day | ORAL | Status: DC

## 2012-07-02 MED ORDER — DEXTROSE 5 % IV SOLN
1.0000 g | INTRAVENOUS | Status: DC
  Administered 2012-07-03: 1 g via INTRAVENOUS
  Filled 2012-07-02 (×2): qty 10

## 2012-07-02 MED ORDER — LISINOPRIL 20 MG PO TABS
20.0000 mg | ORAL_TABLET | Freq: Every day | ORAL | Status: DC
  Administered 2012-07-02 – 2012-07-04 (×3): 20 mg via ORAL
  Filled 2012-07-02 (×3): qty 1

## 2012-07-02 MED ORDER — DEXTROSE 5 % IV SOLN
500.0000 mg | INTRAVENOUS | Status: DC
  Administered 2012-07-03: 500 mg via INTRAVENOUS
  Filled 2012-07-02: qty 500

## 2012-07-02 MED ORDER — ONDANSETRON HCL 4 MG PO TABS: 4.0000 mg | ORAL_TABLET | Freq: Four times a day (QID) | ORAL | Status: DC | PRN

## 2012-07-02 MED ORDER — SODIUM CHLORIDE 0.9 % IV SOLN: INTRAVENOUS | Status: DC

## 2012-07-02 MED ORDER — ALBUTEROL SULFATE (5 MG/ML) 0.5% IN NEBU
5.0000 mg | INHALATION_SOLUTION | Freq: Once | RESPIRATORY_TRACT | Status: AC
  Administered 2012-07-02: 5 mg via RESPIRATORY_TRACT
  Filled 2012-07-02: qty 1

## 2012-07-02 MED ORDER — INSULIN ASPART 100 UNIT/ML ~~LOC~~ SOLN: 0.0000 [IU] | Freq: Every day | SUBCUTANEOUS | Status: DC

## 2012-07-02 MED ORDER — IPRATROPIUM BROMIDE 0.02 % IN SOLN: 0.5000 mg | RESPIRATORY_TRACT | Status: DC | PRN

## 2012-07-02 MED ORDER — PANTOPRAZOLE SODIUM 40 MG PO TBEC
40.0000 mg | DELAYED_RELEASE_TABLET | Freq: Every day | ORAL | Status: DC
  Administered 2012-07-02 – 2012-07-03 (×2): 40 mg via ORAL
  Filled 2012-07-02 (×4): qty 1

## 2012-07-02 MED ORDER — PREDNISONE 20 MG PO TABS
60.0000 mg | ORAL_TABLET | Freq: Once | ORAL | Status: AC
  Administered 2012-07-02: 60 mg via ORAL
  Filled 2012-07-02: qty 3

## 2012-07-02 MED ORDER — NAPROXEN 500 MG PO TABS
500.0000 mg | ORAL_TABLET | Freq: Two times a day (BID) | ORAL | Status: DC | PRN
  Filled 2012-07-02: qty 1

## 2012-07-02 MED ORDER — ACYCLOVIR 200 MG PO CAPS
200.0000 mg | ORAL_CAPSULE | Freq: Two times a day (BID) | ORAL | Status: DC
  Administered 2012-07-02 – 2012-07-04 (×4): 200 mg via ORAL
  Filled 2012-07-02 (×5): qty 1

## 2012-07-02 MED ORDER — AMLODIPINE BESYLATE 5 MG PO TABS
5.0000 mg | ORAL_TABLET | Freq: Every day | ORAL | Status: DC
  Administered 2012-07-02 – 2012-07-04 (×3): 5 mg via ORAL
  Filled 2012-07-02 (×3): qty 1

## 2012-07-02 NOTE — H&P (Signed)
[facility] History and Physical  Sydney Williams AVW:[id number] DOB: 1966/09/28 DOA: 07/02/2012  Referring physician: ER physician PCP: Georganna Skeans, MD   Chief Complaint: shortness of breath  HPI:  46 year old female with history significant for morbid obesity, asthma, HTN, dyslipidemia, DM who presented to ED with progressively worsening shortness of breath over past 2 days. Patient reported using inhalers more frequently than prescribed but with no symptomatic relief. Patient reports having shortness of breath during day time and night time and is associated with cough productive of clear to whitish sputum. No fever or chills. No complaints of chest pain, no palpitations. No abdominal pain, no nausea or vomiting.  Assessment and Plan:  Principal Problem:  *Acute respiratory failure - secondary to asthma exacerbation - will use bronchodilators Q 2 hours PRN - solumedrol 60 mg IV Q 12 hours - antibiotics for possible community acquired pneumonia - now requires bipap - admit to stepdown  Active Problems:  Asthma exacerbation - management as above with bronchodilators and steroids - bipap required at this time  Community acquired pneumonia - azithromycin and ceftriaxone - follow up labs associated with pneumonia order set   Hypertension - continue home meds   GERD (gastroesophageal reflux disease) - continue protonix   Obesity - encouraged weight loss   Diabetes mellitus - sliding scale insulin - continue home medications  Review of Systems:  Constitutional: Negative for fever, chills and malaise/fatigue. Negative for diaphoresis.  HENT: Negative for hearing loss, ear pain, nosebleeds, congestion, sore throat, neck pain, tinnitus and ear discharge.   Eyes: Negative for blurred vision, double vision, photophobia, pain, discharge and redness.  Respiratory: per HPI.   Cardiovascular: Negative for chest pain, palpitations, orthopnea, claudication and leg swelling.    Gastrointestinal: Negative for nausea, vomiting and abdominal pain. Negative for heartburn, constipation, blood in stool and melena.  Genitourinary: Negative for dysuria, urgency, frequency, hematuria and flank pain.  Musculoskeletal: Negative for myalgias, back pain, joint pain and falls.  Skin: Negative for itching and rash.  Neurological: Negative for dizziness and weakness. Negative for tingling, tremors, sensory change, speech change, focal weakness, loss of consciousness and headaches.  Endo/Heme/Allergies: Negative for environmental allergies and polydipsia. Does not bruise/bleed easily.  Psychiatric/Behavioral: Negative for suicidal ideas. The patient is not nervous/anxious.      Past Medical History  Diagnosis Date  . Diabetes mellitus   . Hypertension   . Asthma   . GERD (gastroesophageal reflux disease)   . Migraines    Past Surgical History  Procedure Date  . Btl   . Tubal ligation    Social History:  reports that she has been smoking Cigarettes.  She has been smoking about .25 packs per day. She has never used smokeless tobacco. She reports that she does not drink alcohol or use illicit drugs.  Allergies  Allergen Reactions  . Pneumococcal Vaccines Swelling  . Vicodin (Hydrocodone-Acetaminophen) Rash    Family History: diabetes and heart disease runs in family  Prior to Admission medications   Medication Sig Start Date End Date Taking? Authorizing Provider  acyclovir (ZOVIRAX) 200 MG capsule Take 200 mg by mouth 2 (two) times daily.     Yes Historical Provider, MD  albuterol (PROVENTIL HFA;VENTOLIN HFA) 108 (90 BASE) MCG/ACT inhaler Inhale 2 puffs into the lungs every 6 (six) hours as needed. wheezing   Yes Historical Provider, MD  amLODipine (NORVASC) 5 MG tablet Take 5 mg by mouth daily.     Yes Historical Provider, MD  aspirin EC  81 MG tablet Take 81 mg by mouth daily.   Yes Historical Provider, MD  atorvastatin (LIPITOR) 20 MG tablet Take 20 mg by mouth daily.      Yes Historical Provider, MD  ferrous sulfate 325 (65 FE) MG tablet Take 325 mg by mouth 2 (two) times daily before a meal.   Yes Historical Provider, MD  Fluticasone-Salmeterol (ADVAIR DISKUS) 250-50 MCG/DOSE AEPB Inhale 1 puff into the lungs every 12 (twelve) hours.     Yes Historical Provider, MD  lisinopril-hydrochlorothiazide (PRINZIDE,ZESTORETIC) 20-12.5 MG per tablet Take 1 tablet by mouth daily.     Yes Historical Provider, MD  metFORMIN (GLUCOPHAGE) 1000 MG tablet Take 1,000 mg by mouth 2 (two) times daily with a meal.     Yes Historical Provider, MD  naproxen (NAPROSYN) 500 MG tablet Take 500 mg by mouth 2 (two) times daily as needed. Pain   Yes Historical Provider, MD  pantoprazole (PROTONIX) 40 MG tablet Take 40 mg by mouth daily.     Yes Historical Provider, MD   Physical Exam: Filed Vitals:   07/02/12 0806 07/02/12 0955 07/02/12 1159 07/02/12 1300  BP:   132/80 137/87  Pulse:  92 91 92  Temp:   98 F (36.7 C)   TempSrc:      Resp:  18 20 22   Weight:      SpO2: 99% 92% 98% 100%    Physical Exam  Constitutional: Appears well-developed and well-nourished. No distress.  HENT: Normocephalic. External right and left ear normal. Oropharynx is clear and moist.  Eyes: Conjunctivae and EOM are normal. PERRLA, no scleral icterus.  Neck: Normal ROM. Neck supple. No JVD. No tracheal deviation. No thyromegaly.  CVS: RRR, S1/S2 +, no murmurs, no gallops, no carotid bruit.  Pulmonary: wheezing appreciated over upper lung lobes, some rhonchi over mid lung lobes  Abdominal: Soft. Obese, BS +,  no distension, tenderness, rebound or guarding.  Musculoskeletal: Normal range of motion. No  tenderness.  Lymphadenopathy: No lymphadenopathy noted, cervical, inguinal. Neuro: Alert. Normal reflexes, muscle tone coordination. No cranial nerve deficit. Skin: Skin is warm and dry. No rash noted. Not diaphoretic. No erythema. No pallor.  Psychiatric: Normal mood and affect. Behavior, judgment,  thought content normal.   Labs on Admission:  Basic Metabolic Panel:  Lab 07/02/12 1610  NA 143  K 3.4*  CL 101  CO2 --  GLUCOSE 104*  BUN 5*  CREATININE 0.70  CALCIUM --  MG --  PHOS --   Liver Function Tests: No results found for this basename: AST:5,ALT:5,ALKPHOS:5,BILITOT:5,PROT:5,ALBUMIN:5 in the last 168 hours No results found for this basename: LIPASE:5,AMYLASE:5 in the last 168 hours No results found for this basename: AMMONIA:5 in the last 168 hours CBC:  Lab 07/02/12 1018 07/02/12 0940  WBC -- 6.7  NEUTROABS -- 4.8  HGB 13.9 12.5  HCT 41.0 37.9  MCV -- 89.4  PLT -- 309   Cardiac Enzymes: No results found for this basename: CKTOTAL:5,CKMB:5,CKMBINDEX:5,TROPONINI:5 in the last 168 hours BNP: No components found with this basename: POCBNP:5 CBG: No results found for this basename: GLUCAP:5 in the last 168 hours  Radiological Exams on Admission: Dg Chest 2 View  07/02/2012  *RADIOLOGY REPORT*  Clinical Data: Shortness of breath, dry cough, smoker, history asthma, bilateral chest pain  CHEST - 2 VIEW  Comparison: 12/22/2009  Findings: Normal heart size, mediastinal contours, and pulmonary vascularity. Lungs clear. Minimal peribronchial thickening. No pleural effusion or pneumothorax.  IMPRESSION: Peribronchial thickening, which could reflect bronchitis or  asthma. No acute infiltrate.   Original Report Authenticated By: Lollie Marrow, M.D.     EKG: Normal sinus rhythm, no ST/T wave changes  Code Status: Full Family Communication: Pt at bedside Disposition Plan: Admit for further evaluation  Manson Passey, MD  [facility] Pager [phone]  If 7PM-7AM, please contact night-coverage [email or web address] Password [email or web address] 07/02/2012, 2:16 PM

## 2012-07-02 NOTE — Progress Notes (Signed)
Sydney Williams, is a 46 y.o. female,   MRN: [id number]  -  DOB - October 14, 1966  Outpatient Primary MD for the patient is Georganna Skeans, MD  in for    Chief Complaint  Patient presents with  . Shortness of Breath     Blood pressure 135/86, pulse 92, temperature 98 F (36.7 C), temperature source Oral, resp. rate 18, weight 122.471 kg (270 lb), SpO2 92.00%.  Principal Problem:  *Acute respiratory failure Active Problems:  Asthma exacerbation  Hypertension  GERD (gastroesophageal reflux disease)  Obesity  Very pleasant 46 yo hx asthma, HTN, DM, GERD, obesity presents to [facility] ed cc SOB for last 2 days. Is a patient of [facility] and reports "running low" on inhaler as she has had to use more frequently last 2 days. Pt unsure of trigger as she denies recent illness, fever, chills, nausea/vomiting. Associated symptoms include cough with mild sputum production, wheezes and nasal congestion. She does have smoking history.   In ED she received nebs and 60mg  prednisone initially. Her resp rate 16-18 and sats >90%.chest xray yields peribronchial thickening, which could reflect bronchitis or asthma. No acute infiltrate. She got little improvement so a continuous nebulizer provided for about 1 hour. Air movement improved per ED MD, wheezing diminished. However, about 20 minutes after discontinuing continuous neb pt became "tight" with worsening air movement and coarse rhonchi. Bipap applied. She was also given rocephin and zithromax and IV started.    On my exam respiratory effort mildly increased, BS with diffuse rhonchi and faint expiratory wheeze. Unable to complete sentence, minimal use of accessory muscles.   Will admit to SD given Bipap.

## 2012-07-02 NOTE — ED Notes (Signed)
Pt alert, arrives from home, c/o sob, onset several days ago, + smoking hx, + asthma, resp even, mild labored, moist cough noted

## 2012-07-02 NOTE — ED Provider Notes (Signed)
History     CSN: [id number]  Arrival date & time 07/02/12  0544   First MD Initiated Contact with Patient 07/02/12 0701      Chief Complaint  Patient presents with  . Shortness of Breath    (Consider location/radiation/quality/duration/timing/severity/associated sxs/prior treatment) HPI  Patient reports she has a history of asthma. She states she's never had to be admitted to the hospital before. She reports she started getting shortness of breath with wheezing and cough with yellow/green sputum production 2 days ago. She has clear rhinorrhea. She states her throat feels itchy. She denies fever, nausea, vomiting, or diarrhea. She states she's been on steroids in the past.  [facility]  Past Medical History  Diagnosis Date  . Diabetes mellitus   . Hypertension   . Asthma   . GERD (gastroesophageal reflux disease)   . Migraines     Past Surgical History  Procedure Date  . Btl   . Tubal ligation     Family History  Problem Relation Age of Onset  . Diabetes Mother   . Hypertension Mother     History  Substance Use Topics  . Smoking status: Current Everyday Smoker -- 1/4 packs/day    Types: Cigarettes  . Smokeless tobacco: Not on file  . Alcohol Use: No  employed as Psychologist, sport and exercise Lives with spouse  OB History    Grav Para Term Preterm Abortions TAB SAB Ect Mult Living                  Review of Systems  All other systems reviewed and are negative.    Allergies  Vicodin  Home Medications   Current Outpatient Rx  Name Route Sig Dispense Refill  . ACYCLOVIR 200 MG PO CAPS Oral Take 200 mg by mouth 2 (two) times daily.      . ALBUTEROL SULFATE HFA 108 (90 BASE) MCG/ACT IN AERS Inhalation Inhale 2 puffs into the lungs every 6 (six) hours as needed. wheezing    . AMLODIPINE BESYLATE 5 MG PO TABS Oral Take 5 mg by mouth daily.      . ASPIRIN EC 81 MG PO TBEC Oral Take 81 mg by mouth daily.    . ATORVASTATIN CALCIUM 20 MG PO TABS Oral Take 20 mg by mouth  daily.      Marland Kitchen FERROUS SULFATE 325 (65 FE) MG PO TABS Oral Take 325 mg by mouth 2 (two) times daily before a meal.    . FLUTICASONE-SALMETEROL 250-50 MCG/DOSE IN AEPB Inhalation Inhale 1 puff into the lungs every 12 (twelve) hours.      Marland Kitchen LISINOPRIL-HYDROCHLOROTHIAZIDE 20-12.5 MG PO TABS Oral Take 1 tablet by mouth daily.      Marland Kitchen METFORMIN HCL 1000 MG PO TABS Oral Take 1,000 mg by mouth 2 (two) times daily with a meal.      . NAPROXEN 500 MG PO TABS Oral Take 500 mg by mouth 2 (two) times daily as needed. Pain    . PANTOPRAZOLE SODIUM 40 MG PO TBEC Oral  Ran out Take 40 mg by mouth daily.        BP 135/86  Pulse 88  Temp 98 F (36.7 C) (Oral)  Resp 16  Wt 270 lb (122.471 kg)  SpO2 99%  Vital signs normal   Physical Exam  Nursing note and vitals reviewed. Constitutional: She is oriented to person, place, and time. She appears well-developed and well-nourished.  Non-toxic appearance. She does not appear ill. No distress.  Patient seen after first nebulizer treatment ordered by nursing staff and states that it had helped.  HENT:  Head: Normocephalic and atraumatic.  Right Ear: External ear normal.  Left Ear: External ear normal.  Nose: Nose normal. No mucosal edema or rhinorrhea.  Mouth/Throat: Oropharynx is clear and moist and mucous membranes are normal. No dental abscesses or uvula swelling.  Eyes: Conjunctivae and EOM are normal. Pupils are equal, round, and reactive to light.  Neck: Normal range of motion and full passive range of motion without pain. Neck supple.  Cardiovascular: Normal rate, regular rhythm and normal heart sounds.  Exam reveals no gallop and no friction rub.   No murmur heard. Pulmonary/Chest: She is in respiratory distress. She has wheezes. She has no rhonchi. She has no rales. She exhibits no tenderness and no crepitus.       Patient's noted to have diffuse bilateral wheezing, mild retractions  Abdominal: Soft. Normal appearance and bowel sounds are  normal. She exhibits no distension. There is no tenderness. There is no rebound and no guarding.  Musculoskeletal: Normal range of motion. She exhibits no edema and no tenderness.       Moves all extremities well.   Neurological: She is alert and oriented to person, place, and time. She has normal strength. No cranial nerve deficit.  Skin: Skin is warm, dry and intact. No rash noted. No erythema. No pallor.  Psychiatric: She has a normal mood and affect. Her speech is normal and behavior is normal. Her mood appears not anxious.    ED Course  Procedures (including critical care time)   Medications  albuterol (PROVENTIL) (5 MG/ML) 0.5% nebulizer solution (not administered)  0.9 %  sodium chloride infusion (  Intravenous Bolus from Bag 07/02/12 1034)  cefTRIAXone (ROCEPHIN) 1 g in dextrose 5 % 50 mL IVPB (not administered)  azithromycin (ZITHROMAX) 500 mg in dextrose 5 % 250 mL IVPB (not administered)  albuterol (PROVENTIL) (5 MG/ML) 0.5% nebulizer solution 5 mg (5 mg Nebulization Given 07/02/12 0609)  ipratropium (ATROVENT) nebulizer solution 0.5 mg (0.5 mg Nebulization Given 07/02/12 0609)  albuterol (PROVENTIL,VENTOLIN) solution continuous neb (15 mg/hr Nebulization Given 07/02/12 0805)  ipratropium (ATROVENT) nebulizer solution 0.5 mg (0.5 mg Nebulization Given 07/02/12 0806)  predniSONE (DELTASONE) tablet 60 mg (60 mg Oral Given 07/02/12 0734)  sodium chloride 0.9 % bolus 700 mL (700 mL Intravenous Given 07/02/12 0950)  magnesium sulfate IVPB 1 g 100 mL (1 g Intravenous Given 07/02/12 1000)     7:30 AM patient started on continuous nebulizer and given oral prednisone.  Recheck at 09:00 patient at end of her continuous nebulizer. She is noted to have bilateral rhonchi with much improved air movement.  Recheck at 09 20 after her continuous nebulizer had finished. Patient noted to be in more respiratory distress. She has diffuse tight wheezing in all lung fields and at times has audible wheezing.  Patient will be placed on BiPAP and IV started. She will be given IV magnesium. We have discussed that she will need to be admitted.  10:10 Pt on BiPap, wheezing has improved greatly, still has some retractions.   10:54 Toya Smothers, NP for [facility], admit to step-down team 8, Dr Chancy Milroy  Results for orders placed during the hospital encounter of 07/02/12  CBC WITH DIFFERENTIAL      Component Value Range   WBC 6.7  4.0 - 10.5 K/uL   RBC 4.24  3.87 - 5.11 MIL/uL   Hemoglobin 12.5  12.0 - 15.0  g/dL   HCT 16.1  09.6 - 04.5 %   MCV 89.4  78.0 - 100.0 fL   MCH 29.5  26.0 - 34.0 pg   MCHC 33.0  30.0 - 36.0 g/dL   RDW 40.9 (*) 81.1 - 91.4 %   Platelets 309  150 - 400 K/uL   Neutrophils Relative 71  43 - 77 %   Neutro Abs 4.8  1.7 - 7.7 K/uL   Lymphocytes Relative 18  12 - 46 %   Lymphs Abs 1.2  0.7 - 4.0 K/uL   Monocytes Relative 7  3 - 12 %   Monocytes Absolute 0.5  0.1 - 1.0 K/uL   Eosinophils Relative 4  0 - 5 %   Eosinophils Absolute 0.3  0.0 - 0.7 K/uL   Basophils Relative 0  0 - 1 %   Basophils Absolute 0.0  0.0 - 0.1 K/uL  POCT I-STAT, CHEM 8      Component Value Range   Sodium 143  135 - 145 mEq/L   Potassium 3.4 (*) 3.5 - 5.1 mEq/L   Chloride 101  96 - 112 mEq/L   BUN 5 (*) 6 - 23 mg/dL   Creatinine, Ser 7.82  0.50 - 1.10 mg/dL   Glucose, Bld 956 (*) 70 - 99 mg/dL   Calcium, Ion 2.13  0.86 - 1.23 mmol/L   TCO2 27  0 - 100 mmol/L   Hemoglobin 13.9  12.0 - 15.0 g/dL   HCT 57.8  46.9 - 62.9 %   Laboratory interpretation all normal except mild hypokalemia   Dg Chest 2 View  07/02/2012  *RADIOLOGY REPORT*  Clinical Data: Shortness of breath, dry cough, smoker, history asthma, bilateral chest pain  CHEST - 2 VIEW  Comparison: 12/22/2009  Findings: Normal heart size, mediastinal contours, and pulmonary vascularity. Lungs clear. Minimal peribronchial thickening. No pleural effusion or pneumothorax.  IMPRESSION: Peribronchial thickening, which could reflect bronchitis or asthma.  No acute infiltrate.   Original Report Authenticated By: Lollie Marrow, M.D.      1. Asthma exacerbation     Plan admission   Devoria Albe, MD, FACEP   CRITICAL CARE Performed by: Ward Givens   Total critical care time: 50 min  Critical care time was exclusive of separately billable procedures and treating other patients.  Critical care was necessary to treat or prevent imminent or life-threatening deterioration.  Critical care was time spent personally by me on the following activities: development of treatment plan with patient and/or surrogate as well as nursing, discussions with consultants, evaluation of patient's response to treatment, examination of patient, obtaining history from patient or surrogate, ordering and performing treatments and interventions, ordering and review of laboratory studies, ordering and review of radiographic studies, pulse oximetry and re-evaluation of patient's condition.   MDM          Ward Givens, MD 07/02/12 1106

## 2012-07-03 LAB — COMPREHENSIVE METABOLIC PANEL
ALT: 9 U/L (ref 0–35)
AST: 11 U/L (ref 0–37)
Albumin: 3.4 g/dL — ABNORMAL LOW (ref 3.5–5.2)
Alkaline Phosphatase: 76 U/L (ref 39–117)
BUN: 12 mg/dL (ref 6–23)
CO2: 24 mEq/L (ref 19–32)
Calcium: 8.9 mg/dL (ref 8.4–10.5)
Chloride: 103 mEq/L (ref 96–112)
Creatinine, Ser: 0.48 mg/dL — ABNORMAL LOW (ref 0.50–1.10)
GFR calc Af Amer: >90 mL/min (ref >90)
GFR calc non Af Amer: >90 mL/min (ref >90)
Glucose, Bld: 155 mg/dL — ABNORMAL HIGH (ref 70–99)
Potassium: 3.8 mEq/L (ref 3.5–5.1)
Sodium: 137 mEq/L (ref 135–145)
Total Bilirubin: 0.1 mg/dL — ABNORMAL LOW (ref 0.3–1.2)
Total Protein: 7.4 g/dL (ref 6.0–8.3)

## 2012-07-03 LAB — LEGIONELLA ANTIGEN, URINE: Legionella Antigen, Urine: NEGATIVE

## 2012-07-03 LAB — GLUCOSE, CAPILLARY
Glucose-Capillary: 100 mg/dL — ABNORMAL HIGH (ref 70–99)
Glucose-Capillary: 132 mg/dL — ABNORMAL HIGH (ref 70–99)
Glucose-Capillary: 113 mg/dL — ABNORMAL HIGH (ref 70–99)

## 2012-07-03 LAB — CBC
HCT: 35.4 % — ABNORMAL LOW (ref 36.0–46.0)
Hemoglobin: 11.7 g/dL — ABNORMAL LOW (ref 12.0–15.0)
MCH: 29.6 pg (ref 26.0–34.0)
MCHC: 33.1 g/dL (ref 30.0–36.0)
MCV: 89.6 fL (ref 78.0–100.0)
Platelets: 262 10*3/uL (ref 150–400)
RBC: 3.95 MIL/uL (ref 3.87–5.11)
RDW: 16.3 % — ABNORMAL HIGH (ref 11.5–15.5)
WBC: 8.5 10*3/uL (ref 4.0–10.5)

## 2012-07-03 LAB — HEMOGLOBIN A1C
Hgb A1c MFr Bld: 6 % — ABNORMAL HIGH (ref <5.7)
Mean Plasma Glucose: 126 mg/dL — ABNORMAL HIGH (ref <117)

## 2012-07-03 LAB — EXPECTORATED SPUTUM ASSESSMENT W GRAM STAIN, RFLX TO RESP C

## 2012-07-03 MED ORDER — PREDNISONE 50 MG PO TABS
60.0000 mg | ORAL_TABLET | Freq: Every day | ORAL | Status: DC
  Administered 2012-07-03 – 2012-07-04 (×2): 60 mg via ORAL
  Filled 2012-07-03 (×4): qty 1

## 2012-07-03 MED ORDER — ALBUTEROL SULFATE HFA 108 (90 BASE) MCG/ACT IN AERS
2.0000 | INHALATION_SPRAY | RESPIRATORY_TRACT | Status: DC
  Administered 2012-07-03 (×2): 2 via RESPIRATORY_TRACT
  Filled 2012-07-03: qty 6.7

## 2012-07-03 MED ORDER — TIOTROPIUM BROMIDE MONOHYDRATE 18 MCG IN CAPS
18.0000 ug | ORAL_CAPSULE | Freq: Every day | RESPIRATORY_TRACT | Status: DC
  Administered 2012-07-04: 18 ug via RESPIRATORY_TRACT
  Filled 2012-07-03: qty 5

## 2012-07-03 NOTE — Progress Notes (Signed)
PT Cancellation Note  Treatment cancelled today due to pt is dressed sitting up on EOB.  She states she has not trouble walking and no PT needs.Manson Passey, Cherylann Parr 07/03/2012, 8:59 AM

## 2012-07-03 NOTE — Care Management Note (Unsigned)
    Page 1 of 1   07/03/2012     4:33:22 PM   CARE MANAGEMENT NOTE 07/03/2012  Patient:  Sydney Williams, Sydney Williams   Account Number:  [id number]  Date Initiated:  07/03/2012  Documentation initiated by:  Daisy Blossom Assessment:   46 yr old female admitted with exacerbation of asthma.     Action/Plan:   Plan to discharge when medically stable.   Anticipated DC Date:  07/04/2012   Anticipated DC Plan:  HOME/SELF CARE      DC Planning Services  CM consult  [facility] / [facility] (established/new)  Medication Assistance      Choice offered to / List presented to:             Status of service:  Completed, signed off Medicare Important Message given?   (If response is "NO", the following Medicare IM given date fields will be blank) Date Medicare IM given:   Date Additional Medicare IM given:    Discharge Disposition:    Per UR Regulation:  Reviewed for med. necessity/level of care/duration of stay  If discussed at Long Length of Stay Meetings, dates discussed:    Comments:  07/03/2012  3:45pm  Konrad Felix RN, case manager Met with patient who confirms she has an orange card and was being seen by [facility]. With her agreement, I have her to [facility] who state they will give her an appt to start being seen by their clinics. The phone number in the chart is appropriate. Per pharmacy, patient is eligible for the [employer]-Fund, so we can supply her with three days of meds if she is discharged over the weekend. Otherwise, she can take the prescriptions to the [facility] outpatient pharmacies and use her orange card.  UPDATE:  [facility] has requested patient call them on Monday morning. I have provided patient with an application packet and business card for the [facility] representative.

## 2012-07-03 NOTE — Progress Notes (Addendum)
PROGRESS NOTE  Sydney Williams AOZ:[id number] DOB: 1966/06/05 DOA: 07/02/2012 PCP: Georganna Skeans, MD  Brief narrative: 46 year old female with adult onset asthma presented to the emergency room 07/02/12 with an acute asthma exacerbation necessitating her being on the step down unit transiently because of significant shortness of breath and work of breathing. She states that 2-3 days prior to admission she started to feel ill as her daughter had a cold. She used Lysol spray to disinfect the area and then developed a nonproductive cough with wheezing. She had no fevers or chills or dark sputum. She had no stuffiness of her nose or ears. She has a significant history of atopic allergies from being a young woman and subsequently felt ill on the morning of 07/02/2012 and came to the emergency room where she was given an hour along nebulization and 20 minutes after noted to be in acute respiratory distress and placed on BiPAP. BiPAP was subsequently discontinued and she was given IV Solu-Medrol IV every 12-she was started transiently on CAPneumonia antibiotics which were subsequently discontinued given no acute process. She has no current primary care physician and used to see Dr. 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  No acute infiltrate.  Antibiotics:  Acyclovir 20 mg 07/02/12  Azithromycin 9/5 > 9/6  Ceftriaxone 9/5 > 9/6   Subjective  Feels better. Wants to go home. No shortness of breath. Ambulating. No blurred or double vision. Clear sputum. Tolerating by mouth.   Objective    Interim History:  Telemetry: None telemetry  Objective: Filed Vitals:   07/02/12 1829 07/02/12 2033 07/02/12 2110 07/03/12 0505  BP: 147/79  128/65  150/80  Pulse: 88  92 91  Temp: 97.7 F (36.5 C)  98.1 F (36.7 C) 98.1 F (36.7 C)  TempSrc: Oral  Oral Oral  Resp: 16  18 17   Height:      Weight:    293 lb (132.904 kg)  SpO2: 100% 96% 99% 97%    Intake/Output Summary (Last 24 hours) at 07/03/12 1256 Last data filed at 07/03/12 0500  Gross per 24 hour  Intake   1845 ml  Output    550 ml  Net   1295 ml    Exam:  General: Alert obese Morbid obesity, Body mass index is 39.74 kg/(m^2). no pallor no icterus good dentition Cardiovascular: S1-S2 no murmur rub or gallop Respiratory: Diffuse wheezes bibasilar regions no tactile vocal resonance or fremitus Abdomen: Soft nontender nondistended no rebound or guarding Skin no lower extremity edema Neuro grossly intact  Data Reviewed: Basic Metabolic Panel:  Lab 07/03/12 9629 07/02/12 1018 07/02/12 0940  NA 137 143 139  K 3.8 3.4* --  CL 103 101 100  CO2 24 -- 29  GLUCOSE 155* 104* 101*  BUN 12 5* 7  CREATININE 0.48* 0.70 0.51  CALCIUM 8.9 -- 9.5  MG -- -- 1.8  PHOS -- -- 3.0   Liver Function Tests:  Lab 07/03/12 0345 07/02/12 0940  AST 11 16  ALT 9 9  ALKPHOS 76 80  BILITOT 0.1* 0.2*  PROT 7.4 8.0  ALBUMIN 3.4* 3.8   No results found for this basename: LIPASE:5,AMYLASE:5 in the last 168 hours No results found for this basename: AMMONIA:5 in the last 168 hours CBC:  Lab 07/03/12 0345  07/02/12 1018 07/02/12 0940  WBC 8.5 -- 6.7  NEUTROABS -- -- 4.8  HGB 11.7* 13.9 12.5  HCT 35.4* 41.0 37.9  MCV 89.6 -- 89.4  PLT 262 -- 309   Cardiac Enzymes: No results found for this basename: CKTOTAL:5,CKMB:5,CKMBINDEX:5,TROPONINI:5 in the last 168 hours BNP: No components found with this basename: POCBNP:5 CBG:  Lab 07/03/12 1200 07/03/12 0758 07/02/12 2156 07/02/12 1701  GLUCAP 113* 132* 100* 187*    Recent Results (from the past 240 hour(s))  CULTURE, BLOOD (ROUTINE X 2)     Status: Normal (Preliminary result)   Collection Time   07/02/12  2:30 PM      Component  Value Range Status Comment   Specimen Description BLOOD RIGHT ARM   Final    Special Requests BOTTLES DRAWN AEROBIC AND ANAEROBIC 4CC   Final    Culture  Setup Time 07/02/2012 23:17   Final    Culture     Final    Value:        BLOOD CULTURE RECEIVED NO GROWTH TO DATE CULTURE WILL BE HELD FOR 5 DAYS BEFORE ISSUING A FINAL NEGATIVE REPORT   Report Status PENDING   Incomplete   MRSA PCR SCREENING     Status: Normal   Collection Time   07/02/12  3:14 PM      Component Value Range Status Comment   MRSA by PCR NEGATIVE  NEGATIVE Final   CULTURE, BLOOD (ROUTINE X 2)     Status: Normal (Preliminary result)   Collection Time   07/02/12  3:17 PM      Component Value Range Status Comment   Specimen Description BLOOD RIGHT HAND   Final    Special Requests BOTTLES DRAWN AEROBIC ONLY 3CC   Final    Culture  Setup Time 07/02/2012 23:17   Final    Culture     Final    Value:        BLOOD CULTURE RECEIVED NO GROWTH TO DATE CULTURE WILL BE HELD FOR 5 DAYS BEFORE ISSUING A FINAL NEGATIVE REPORT   Report Status PENDING   Incomplete   CULTURE, EXPECTORATED SPUTUM-ASSESSMENT     Status: Normal   Collection Time   07/03/12 11:15 AM      Component Value Range Status Comment   Specimen Description SPUTUM   Final    Special Requests NONE   Final    Sputum evaluation     Final    Value: THIS SPECIMEN IS ACCEPTABLE. RESPIRATORY CULTURE REPORT TO FOLLOW.   Report Status 07/03/2012 FINAL   Final      Studies:              All Imaging reviewed and is as per above notation   Scheduled Meds:   . acyclovir  200 mg Oral BID  . albuterol      . amLODipine  5 mg Oral Daily  . aspirin EC  81 mg Oral Daily  . atorvastatin  20 mg Oral q1800  . azithromycin  500 mg Intravenous Once  . azithromycin  500 mg Intravenous Q24H  . cefTRIAXone (ROCEPHIN)  IV  1 g Intravenous Q24H  . ferrous sulfate  325 mg Oral BID AC  . Fluticasone-Salmeterol  1 puff Inhalation Q12H  . lisinopril  20 mg Oral Daily   And  .  hydrochlorothiazide  12.5 mg Oral Daily  . insulin aspart  0-20 Units Subcutaneous TID WC  . insulin aspart  0-5 Units Subcutaneous QHS  .  metFORMIN  1,000 mg Oral BID WC  . pantoprazole  40 mg Oral Daily  . predniSONE  60 mg Oral QAC breakfast  . DISCONTD: lisinopril-hydrochlorothiazide  1 tablet Oral Daily  . DISCONTD: methylPREDNISolone (SOLU-MEDROL) injection  60 mg Intravenous Q12H   Continuous Infusions:   . DISCONTD: sodium chloride 125 mL/hr at 07/02/12 1034  . DISCONTD: sodium chloride       Assessment/Plan: 1. Acute asthma exacerbation-patient states her inhalers have expired. Patient has no further inhalers at home and has no primary care physician. She was transitioned 07/03/12 to prednisone 60 mg which will need to be tapered over the course of next 5-10 days. She will need education about using a spacer with an inhaler and I would classify her asthma has moderate persistent. She will need tiotropium, albuterol, and Advair to take home. She states she can afford these medications. She will need peak flow meter monitoring every 4 hourly overnight and when she is out of the yellow and red zone she can potentially be discharged 2. Diabetes mellitus-A1c 6.0; blood sugars well controlled continue metformin 1000 mg. Continue sliding scale and she'll need close monitoring in the outpatient setting. Potentially may need to add a sulfonylurea to control her blood sugar better 3. History of herpes-continue acyclovir 4. Morbid obesity-outpatient counseling regarding weight loss. This may contribute to some of the restrictive component to her asthma 5. Hypertension-moderately controlled only-continue lisinopril 20 mg daily, HCTZ 12.5 mg, amlodipine 10 mg daily-if blood pressure still elevated consider increasing HCTZ to 25 mg daily 6. Hyperlipidemia-continue atorvastatin 20 mg daily 7. Borderline microcytic anemia-continue ferrous sulfate 325 daily  Code Status: Full Family Communication:  Discussed with husband and daughter at bedside Disposition Plan: Home in one to 2 days-needs to have a primary care physician prior to discharge   Pleas Koch, MD  [facility] Pager [phone] 07/03/2012, 12:56 PM    LOS: 1 day

## 2012-07-04 LAB — GLUCOSE, CAPILLARY
Glucose-Capillary: 132 mg/dL — ABNORMAL HIGH (ref 70–99)
Glucose-Capillary: 89 mg/dL (ref 70–99)

## 2012-07-04 MED ORDER — ATORVASTATIN CALCIUM 20 MG PO TABS: 20.0000 mg | ORAL_TABLET | Freq: Every day | ORAL | Status: DC

## 2012-07-04 MED ORDER — ALBUTEROL SULFATE HFA 108 (90 BASE) MCG/ACT IN AERS: 2.0000 | INHALATION_SPRAY | Freq: Four times a day (QID) | RESPIRATORY_TRACT | Status: DC | PRN

## 2012-07-04 MED ORDER — TIOTROPIUM BROMIDE MONOHYDRATE 18 MCG IN CAPS: 18.0000 ug | ORAL_CAPSULE | Freq: Every day | RESPIRATORY_TRACT | Status: DC

## 2012-07-04 MED ORDER — ALBUTEROL SULFATE HFA 108 (90 BASE) MCG/ACT IN AERS
2.0000 | INHALATION_SPRAY | Freq: Four times a day (QID) | RESPIRATORY_TRACT | Status: DC
  Administered 2012-07-04: 2 via RESPIRATORY_TRACT
  Filled 2012-07-04: qty 6.7

## 2012-07-04 MED ORDER — PREDNISONE 5 MG PO TABS: 5.0000 mg | ORAL_TABLET | Freq: Every day | ORAL | Status: AC

## 2012-07-04 MED ORDER — MOXIFLOXACIN HCL 400 MG PO TABS: 400.0000 mg | ORAL_TABLET | Freq: Every day | ORAL | Status: AC

## 2012-07-04 MED ORDER — AMLODIPINE BESYLATE 5 MG PO TABS: 5.0000 mg | ORAL_TABLET | Freq: Every day | ORAL | Status: DC

## 2012-07-04 MED ORDER — FLUTICASONE-SALMETEROL 250-50 MCG/DOSE IN AEPB: 1.0000 | INHALATION_SPRAY | Freq: Two times a day (BID) | RESPIRATORY_TRACT | Status: DC

## 2012-07-04 NOTE — Discharge Summary (Signed)
Physician Discharge Summary  Emmalou Hunger MRN: [id number] DOB/AGE: Jun 21, 1966 46 y.o.  PCP: Georganna Skeans, MD   Admit date: 07/02/2012 Discharge date: 07/04/2012  Discharge Diagnoses:     *Acute respiratory failure    Asthma exacerbation  Hypertension  GERD (gastroesophageal reflux disease)  Obesity  Diabetes mellitus   Medication List  As of 07/04/2012 10:57 AM   TAKE these medications         acyclovir 200 MG capsule   Commonly known as: ZOVIRAX   Take 200 mg by mouth 2 (two) times daily.      albuterol 108 (90 BASE) MCG/ACT inhaler   Commonly known as: PROVENTIL HFA;VENTOLIN HFA   Inhale 2 puffs into the lungs every 6 (six) hours as needed. wheezing      amLODipine 5 MG tablet   Commonly known as: NORVASC   Take 1 tablet (5 mg total) by mouth daily.      aspirin EC 81 MG tablet   Take 81 mg by mouth daily.      atorvastatin 20 MG tablet   Commonly known as: LIPITOR   Take 1 tablet (20 mg total) by mouth daily.      ferrous sulfate 325 (65 FE) MG tablet   Take 325 mg by mouth 2 (two) times daily before a meal.      Fluticasone-Salmeterol 250-50 MCG/DOSE Aepb   Commonly known as: ADVAIR   Inhale 1 puff into the lungs every 12 (twelve) hours.      lisinopril-hydrochlorothiazide 20-12.5 MG per tablet   Commonly known as: PRINZIDE,ZESTORETIC   Take 1 tablet by mouth daily.      metFORMIN 1000 MG tablet   Commonly known as: GLUCOPHAGE   Take 1,000 mg by mouth 2 (two) times daily with a meal.      moxifloxacin 400 MG tablet   Commonly known as: AVELOX   Take 1 tablet (400 mg total) by mouth daily.      naproxen 500 MG tablet   Commonly known as: NAPROSYN   Take 500 mg by mouth 2 (two) times daily as needed. Pain      pantoprazole 40 MG tablet   Commonly known as: PROTONIX   Take 40 mg by mouth daily.      predniSONE 5 MG tablet   Commonly known as: DELTASONE   Take 1 tablet (5 mg total) by mouth daily.      tiotropium 18 MCG inhalation capsule   Commonly known as: SPIRIVA   Place 1 capsule (18 mcg total) into inhaler and inhale daily.            Discharge Condition: Stable  Disposition: 01-Home or Self Care   Consults: None  Significant Diagnostic Studies: Dg Chest 2 View  07/02/2012  *RADIOLOGY REPORT*  Clinical Data: Shortness of breath, dry cough, smoker, history asthma, bilateral chest pain  CHEST - 2 VIEW  Comparison: 12/22/2009  Findings: Normal heart size, mediastinal contours, and pulmonary vascularity. Lungs clear. Minimal peribronchial thickening. No pleural effusion or pneumothorax.  IMPRESSION: Peribronchial thickening, which could reflect bronchitis or asthma. No acute infiltrate.   Original Report Authenticated By: Lollie Marrow, M.D.      Microbiology: Recent Results (from the past 240 hour(s))  CULTURE, BLOOD (ROUTINE X 2)     Status: Normal (Preliminary result)   Collection Time   07/02/12  2:30 PM      Component Value Range Status Comment   Specimen Description BLOOD RIGHT ARM   Final  Special Requests BOTTLES DRAWN AEROBIC AND ANAEROBIC 4CC   Final    Culture  Setup Time 07/02/2012 23:17   Final    Culture     Final    Value:        BLOOD CULTURE RECEIVED NO GROWTH TO DATE CULTURE WILL BE HELD FOR 5 DAYS BEFORE ISSUING A FINAL NEGATIVE REPORT   Report Status PENDING   Incomplete   MRSA PCR SCREENING     Status: Normal   Collection Time   07/02/12  3:14 PM      Component Value Range Status Comment   MRSA by PCR NEGATIVE  NEGATIVE Final   CULTURE, BLOOD (ROUTINE X 2)     Status: Normal (Preliminary result)   Collection Time   07/02/12  3:17 PM      Component Value Range Status Comment   Specimen Description BLOOD RIGHT HAND   Final    Special Requests BOTTLES DRAWN AEROBIC ONLY 3CC   Final    Culture  Setup Time 07/02/2012 23:17   Final    Culture     Final    Value:        BLOOD CULTURE RECEIVED NO GROWTH TO DATE CULTURE WILL BE HELD FOR 5 DAYS BEFORE ISSUING A FINAL NEGATIVE REPORT   Report Status  PENDING   Incomplete   CULTURE, EXPECTORATED SPUTUM-ASSESSMENT     Status: Normal   Collection Time   07/03/12 11:15 AM      Component Value Range Status Comment   Specimen Description SPUTUM   Final    Special Requests NONE   Final    Sputum evaluation     Final    Value: THIS SPECIMEN IS ACCEPTABLE. RESPIRATORY CULTURE REPORT TO FOLLOW.   Report Status 07/03/2012 FINAL   Final      Labs: Results for orders placed during the hospital encounter of 07/02/12 (from the past 48 hour(s))  CULTURE, BLOOD (ROUTINE X 2)     Status: Normal (Preliminary result)   Collection Time   07/02/12  2:30 PM      Component Value Range Comment   Specimen Description BLOOD RIGHT ARM      Special Requests BOTTLES DRAWN AEROBIC AND ANAEROBIC 4CC      Culture  Setup Time 07/02/2012 23:17      Culture        Value:        BLOOD CULTURE RECEIVED NO GROWTH TO DATE CULTURE WILL BE HELD FOR 5 DAYS BEFORE ISSUING A FINAL NEGATIVE REPORT   Report Status PENDING     PROTIME-INR     Status: Normal   Collection Time   07/02/12  3:07 PM      Component Value Range Comment   Prothrombin Time 13.9  11.6 - 15.2 seconds    INR 1.05  0.00 - 1.49   APTT     Status: Abnormal   Collection Time   07/02/12  3:07 PM      Component Value Range Comment   aPTT 43 (*) 24 - 37 seconds   MRSA PCR SCREENING     Status: Normal   Collection Time   07/02/12  3:14 PM      Component Value Range Comment   MRSA by PCR NEGATIVE  NEGATIVE   CULTURE, BLOOD (ROUTINE X 2)     Status: Normal (Preliminary result)   Collection Time   07/02/12  3:17 PM      Component Value Range Comment  Specimen Description BLOOD RIGHT HAND      Special Requests BOTTLES DRAWN AEROBIC ONLY 3CC      Culture  Setup Time 07/02/2012 23:17      Culture        Value:        BLOOD CULTURE RECEIVED NO GROWTH TO DATE CULTURE WILL BE HELD FOR 5 DAYS BEFORE ISSUING A FINAL NEGATIVE REPORT   Report Status PENDING     LEGIONELLA ANTIGEN, URINE     Status: Normal   Collection  Time   07/02/12  4:23 PM      Component Value Range Comment   Specimen Description URINE, CLEAN CATCH      Special Requests NONE      Legionella Antigen, Urine Negative for Legionella pneumophilia serogroup 1      Report Status 07/03/2012 FINAL     STREP PNEUMONIAE URINARY ANTIGEN     Status: Normal   Collection Time   07/02/12  4:23 PM      Component Value Range Comment   Strep Pneumo Urinary Antigen NEGATIVE  NEGATIVE   GLUCOSE, CAPILLARY     Status: Abnormal   Collection Time   07/02/12  5:01 PM      Component Value Range Comment   Glucose-Capillary 187 (*) 70 - 99 mg/dL   GLUCOSE, CAPILLARY     Status: Abnormal   Collection Time   07/02/12  9:56 PM      Component Value Range Comment   Glucose-Capillary 100 (*) 70 - 99 mg/dL    Comment 1 Notify RN     COMPREHENSIVE METABOLIC PANEL     Status: Abnormal   Collection Time   07/03/12  3:45 AM      Component Value Range Comment   Sodium 137  135 - 145 mEq/L    Potassium 3.8  3.5 - 5.1 mEq/L    Chloride 103  96 - 112 mEq/L    CO2 24  19 - 32 mEq/L    Glucose, Bld 155 (*) 70 - 99 mg/dL    BUN 12  6 - 23 mg/dL    Creatinine, Ser 1.61 (*) 0.50 - 1.10 mg/dL    Calcium 8.9  8.4 - 09.6 mg/dL    Total Protein 7.4  6.0 - 8.3 g/dL    Albumin 3.4 (*) 3.5 - 5.2 g/dL    AST 11  0 - 37 U/L    ALT 9  0 - 35 U/L    Alkaline Phosphatase 76  39 - 117 U/L    Total Bilirubin 0.1 (*) 0.3 - 1.2 mg/dL    GFR calc non Af Amer >90  >90 mL/min    GFR calc Af Amer >90  >90 mL/min   CBC     Status: Abnormal   Collection Time   07/03/12  3:45 AM      Component Value Range Comment   WBC 8.5  4.0 - 10.5 K/uL    RBC 3.95  3.87 - 5.11 MIL/uL    Hemoglobin 11.7 (*) 12.0 - 15.0 g/dL    HCT 04.5 (*) 40.9 - 46.0 %    MCV 89.6  78.0 - 100.0 fL    MCH 29.6  26.0 - 34.0 pg    MCHC 33.1  30.0 - 36.0 g/dL    RDW 81.1 (*) 91.4 - 15.5 %    Platelets 262  150 - 400 K/uL   GLUCOSE, CAPILLARY     Status: Abnormal   Collection Time  07/03/12  7:58 AM      Component  Value Range Comment   Glucose-Capillary 132 (*) 70 - 99 mg/dL   CULTURE, EXPECTORATED SPUTUM-ASSESSMENT     Status: Normal   Collection Time   07/03/12 11:15 AM      Component Value Range Comment   Specimen Description SPUTUM      Special Requests NONE      Sputum evaluation        Value: THIS SPECIMEN IS ACCEPTABLE. RESPIRATORY CULTURE REPORT TO FOLLOW.   Report Status 07/03/2012 FINAL     GLUCOSE, CAPILLARY     Status: Abnormal   Collection Time   07/03/12 12:00 PM      Component Value Range Comment   Glucose-Capillary 113 (*) 70 - 99 mg/dL   GLUCOSE, CAPILLARY     Status: Abnormal   Collection Time   07/03/12 10:31 PM      Component Value Range Comment   Glucose-Capillary 132 (*) 70 - 99 mg/dL   GLUCOSE, CAPILLARY     Status: Normal   Collection Time   07/04/12  7:34 AM      Component Value Range Comment   Glucose-Capillary 89  70 - 99 mg/dL      HPI : 46 year old female with history significant for morbid obesity, asthma, HTN, dyslipidemia, DM who presented to ED with progressively worsening shortness of breath over past 2 days. Patient reported using inhalers more frequently than prescribed but with no symptomatic relief. Patient reported having shortness of breath during day time and night time and was associated with cough productive of clear to whitish sputum. No fever or chills. No complaints of chest pain, no palpitations. No abdominal pain, no nausea or vomiting.      HOSPITAL COURSE:  #1 asthma exacerbation 2 with IV steroids, Spiriva, Advair, nebulizer treatments, empiric antibiotics. She will continue with prednisone taper, antibiotics for another 5 days  #2 diabetes hemoglobin A1c of 6.0 on metformin thousand milligrams twice a day this will be continued  #3 Hypertension continue with lisinopril/HCTZ    Discharge Exam:  Blood pressure 142/90, pulse 61, temperature 97.6 F (36.4 C), temperature source Oral, resp. rate 18, height 6' (1.829 m), weight 132.904 kg (293  lb), SpO2 100.00%.  General: Alert obese Morbid obesity, Body mass index is 39.74 kg/(m^2). no pallor no icterus good dentition  Cardiovascular: S1-S2 no murmur rub or gallop  Respiratory: Diffuse wheezes bibasilar regions no tactile vocal resonance or fremitus  Abdomen: Soft nontender nondistended no rebound or guarding  Skin no lower extremity edema  Neuro grossly intact    Discharge Orders    Future Orders Please Complete By Expires   Diet - low sodium heart healthy      Increase activity slowly           Signed: Bless Belshe 07/04/2012, 10:57 AM

## 2012-07-04 NOTE — Progress Notes (Signed)
Pt D/C home, alert and oriented, O2 sats 96% without oxygen while ambulating. D/C instructions done. Pt verbalizes understanding. Medication administration done. Pt denies pain, vitals within normal range for pt.

## 2012-07-05 NOTE — Progress Notes (Addendum)
[name] spoke with patient concerning medication assistance. Pt qualifies for indigent funds. Pt educated on program eligibility. CM discussed generic rx with pt, floor RN provided pt with inhalers used during inpt stay. RX for ABX taken to pharmacy to fill with indigent funds. Spouse present at bedside to assist with home care & transportation home. No other needs specified.   Sydney Williams [phone]

## 2012-07-06 LAB — CULTURE, RESPIRATORY W GRAM STAIN
Culture: NORMAL
Gram Stain: NONE SEEN

## 2012-07-08 LAB — CULTURE, BLOOD (ROUTINE X 2)
Culture: NO GROWTH
Culture: NO GROWTH

## 2012-07-31 NOTE — ED Provider Notes (Signed)
Medical screening examination/treatment/procedure(s) were performed by non-physician practitioner and as supervising physician I was immediately available for consultation/collaboration.  Wai Litt K Chantalle Defilippo-Rasch, MD 07/31/12 0122 

## 2012-11-25 ENCOUNTER — Other Ambulatory Visit: Payer: Self-pay | Admitting: Internal Medicine

## 2012-11-25 DIAGNOSIS — D259 Leiomyoma of uterus, unspecified: Secondary | ICD-10-CM

## 2012-11-30 ENCOUNTER — Other Ambulatory Visit: Payer: Self-pay

## 2012-12-02 ENCOUNTER — Other Ambulatory Visit: Payer: Self-pay

## 2012-12-21 ENCOUNTER — Encounter (HOSPITAL_COMMUNITY): Payer: Self-pay | Admitting: Emergency Medicine

## 2012-12-21 ENCOUNTER — Emergency Department (HOSPITAL_COMMUNITY)
Admission: EM | Admit: 2012-12-21 | Discharge: 2012-12-21 | Disposition: A | Discharge status: Home / Self Care | Payer: BC Managed Care – PPO | Attending: Emergency Medicine | Admitting: Emergency Medicine

## 2012-12-21 DIAGNOSIS — R52 Pain, unspecified: Secondary | ICD-10-CM | POA: Insufficient documentation

## 2012-12-21 DIAGNOSIS — F17200 Nicotine dependence, unspecified, uncomplicated: Secondary | ICD-10-CM | POA: Insufficient documentation

## 2012-12-21 DIAGNOSIS — E119 Type 2 diabetes mellitus without complications: Secondary | ICD-10-CM | POA: Insufficient documentation

## 2012-12-21 DIAGNOSIS — Z79899 Other long term (current) drug therapy: Secondary | ICD-10-CM | POA: Insufficient documentation

## 2012-12-21 DIAGNOSIS — J45909 Unspecified asthma, uncomplicated: Secondary | ICD-10-CM | POA: Insufficient documentation

## 2012-12-21 DIAGNOSIS — K219 Gastro-esophageal reflux disease without esophagitis: Secondary | ICD-10-CM | POA: Insufficient documentation

## 2012-12-21 DIAGNOSIS — I10 Essential (primary) hypertension: Secondary | ICD-10-CM | POA: Insufficient documentation

## 2012-12-21 DIAGNOSIS — Z7982 Long term (current) use of aspirin: Secondary | ICD-10-CM | POA: Insufficient documentation

## 2012-12-21 DIAGNOSIS — Z8679 Personal history of other diseases of the circulatory system: Secondary | ICD-10-CM | POA: Insufficient documentation

## 2012-12-21 DIAGNOSIS — R51 Headache: Secondary | ICD-10-CM | POA: Insufficient documentation

## 2012-12-21 MED ORDER — METOCLOPRAMIDE HCL 5 MG/ML IJ SOLN
10.0000 mg | Freq: Once | INTRAMUSCULAR | Status: AC
  Administered 2012-12-21: 10 mg via INTRAVENOUS
  Filled 2012-12-21: qty 2

## 2012-12-21 MED ORDER — DIPHENHYDRAMINE HCL 50 MG/ML IJ SOLN
25.0000 mg | Freq: Once | INTRAMUSCULAR | Status: AC
  Administered 2012-12-21: 25 mg via INTRAVENOUS
  Filled 2012-12-21: qty 1

## 2012-12-21 MED ORDER — SODIUM CHLORIDE 0.9 % IV BOLUS (SEPSIS)
1000.0000 mL | Freq: Once | INTRAVENOUS | Status: AC
  Administered 2012-12-21: 1000 mL via INTRAVENOUS

## 2012-12-21 MED ORDER — KETOROLAC TROMETHAMINE 30 MG/ML IJ SOLN
30.0000 mg | Freq: Once | INTRAMUSCULAR | Status: AC
  Administered 2012-12-21: 30 mg via INTRAVENOUS
  Filled 2012-12-21: qty 1

## 2012-12-21 NOTE — ED Notes (Signed)
Pt alert, arrives from home, c/o gen body aches, nausea, onset several days ago, worse today, denies changes in bowel or bladder, recent ill exposures though work

## 2012-12-21 NOTE — ED Provider Notes (Signed)
History     CSN: [id number]  Arrival date & time 12/21/12  0613   First MD Initiated Contact with Patient 12/21/12 [phone]      Chief Complaint  Patient presents with  . Nausea  . Generalized Body Aches    (Consider location/radiation/quality/duration/timing/severity/associated sxs/prior treatment) HPI  47 year old female with history of diabetes, hypertension, migraine and asthma presents with headache. Patient reports for the past 4 days she has developed a gradual onset of headache. Describe headaches as a sharp throbbing sensation to the forehead, nonradiating, persistent, with associate light sensitivity, nausea, vomiting, generalized body aches.  Vomitus <5 times a day and is non bilious, non bloody. Headache is not improved with Tylenol, or home remedies. She was recently exposed to some viral illness from a coworker. She denies fever, chills, double vision, runny nose, sneezing, ear pain, sore throat, neck stiffness, chest pain, shortness of breath, abdominal pain, back pain, dysuria, or rash. Her headache feels similar to her prior migraine headache which usually improves with migraine cocktail.   Past Medical History  Diagnosis Date  . Diabetes mellitus   . Hypertension   . Asthma   . GERD (gastroesophageal reflux disease)   . Migraines     Past Surgical History  Procedure Laterality Date  . Btl    . Tubal ligation      Family History  Problem Relation Age of Onset  . Diabetes Mother   . Hypertension Mother     History  Substance Use Topics  . Smoking status: Current Every Day Smoker -- 0.25 packs/day    Types: Cigarettes  . Smokeless tobacco: Never Used  . Alcohol Use: No    OB History   Grav Para Term Preterm Abortions TAB SAB Ect Mult Living                  Review of Systems  Constitutional:       10 Systems reviewed and all are negative for acute change except as noted in the HPI.     Allergies  Pneumococcal vaccines and Vicodin  Home  Medications   Current Outpatient Rx  Name  Route  Sig  Dispense  Refill  . acyclovir (ZOVIRAX) 200 MG capsule   Oral   Take 200 mg by mouth 2 (two) times daily.           Marland Kitchen albuterol (PROVENTIL HFA;VENTOLIN HFA) 108 (90 BASE) MCG/ACT inhaler   Inhalation   Inhale 2 puffs into the lungs every 6 (six) hours as needed. wheezing   1 Inhaler   2   . amLODipine (NORVASC) 5 MG tablet   Oral   Take 1 tablet (5 mg total) by mouth daily.   30 tablet   1   . aspirin EC 81 MG tablet   Oral   Take 81 mg by mouth daily.         Marland Kitchen atorvastatin (LIPITOR) 20 MG tablet   Oral   Take 1 tablet (20 mg total) by mouth daily.   30 tablet   1   . ferrous sulfate 325 (65 FE) MG tablet   Oral   Take 325 mg by mouth 2 (two) times daily before a meal.         . Fluticasone-Salmeterol (ADVAIR DISKUS) 250-50 MCG/DOSE AEPB   Inhalation   Inhale 1 puff into the lungs every 12 (twelve) hours.   60 each   1   . lisinopril-hydrochlorothiazide (PRINZIDE,ZESTORETIC) 20-12.5 MG per tablet   Oral  Take 1 tablet by mouth daily.           . metFORMIN (GLUCOPHAGE) 1000 MG tablet   Oral   Take 1,000 mg by mouth 2 (two) times daily with a meal.           . naproxen (NAPROSYN) 500 MG tablet   Oral   Take 500 mg by mouth 2 (two) times daily as needed. Pain         . pantoprazole (PROTONIX) 40 MG tablet   Oral   Take 40 mg by mouth daily.           Marland Kitchen tiotropium (SPIRIVA) 18 MCG inhalation capsule   Inhalation   Place 1 capsule (18 mcg total) into inhaler and inhale daily.   30 capsule   1     BP 158/89  Pulse 89  Temp(Src) 98 F (36.7 C) (Oral)  Resp 16  SpO2 99%  LMP 11/23/2012  Physical Exam  Nursing note and vitals reviewed. Constitutional: She is oriented to person, place, and time. She appears well-developed and well-nourished. No distress.  Moderately obese African American female. Awake, alert, nontoxic appearance  HENT:  Head: Atraumatic.  Right Ear: External ear  normal.  Left Ear: External ear normal.  Nose: Nose normal.  Mouth/Throat: Oropharynx is clear and moist.  Eyes: Conjunctivae and EOM are normal. Pupils are equal, round, and reactive to light. Right eye exhibits no discharge. Left eye exhibits no discharge.  Neck: Normal range of motion. Neck supple.  No nuchal rigidity  Cardiovascular: Normal rate and regular rhythm.   Pulmonary/Chest: Effort normal. No respiratory distress. She exhibits no tenderness.  Abdominal: Soft. There is no tenderness. There is no rebound.  Musculoskeletal: She exhibits no edema and no tenderness.  ROM appears intact, no obvious focal weakness  Neurological: She is alert and oriented to person, place, and time.  Mental status and motor strength appears intact  Skin: No rash noted.  Psychiatric: She has a normal mood and affect.    ED Course  Procedures (including critical care time)  Labs Reviewed - No data to display No results found.   No diagnosis found.  6:39 AM Patient was seen and evaluated by me for the complaint of headache. She has no obvious neuro deficits, no nuchal rigidity, no red flags. She is alert and oriented, is afebrile. Her blood pressure is mildly elevated at 158/89, patient does have history of hypertension.  The patient will benefit from a migraine cocktail and monitoring.  8:29 AM Other headache has not resolved completely, patient states she felt much better after receiving migraine cocktail. Patient requests to go home and rest. She also requests work note. Patient is stable for discharge. Return precautions given.  BP 158/89  Pulse 89  Temp(Src) 98 F (36.7 C) (Oral)  Resp 16  SpO2 99%  LMP 11/23/2012  I have reviewed nursing notes and vital signs.   I reviewed available ER/hospitalization records thought the EMR  1. Headache 2. Viral syndrome  MDM          Fayrene Helper, PA-C 12/21/12 0831

## 2012-12-22 NOTE — ED Provider Notes (Signed)
Medical screening examination/treatment/procedure(s) were performed by non-physician practitioner and as supervising physician I was immediately available for consultation/collaboration.   Hanley Seamen, MD 12/22/12 [phone]

## 2013-06-03 ENCOUNTER — Encounter (HOSPITAL_COMMUNITY): Payer: Self-pay | Admitting: Emergency Medicine

## 2013-06-03 ENCOUNTER — Emergency Department (HOSPITAL_COMMUNITY): Payer: Worker's Compensation

## 2013-06-03 ENCOUNTER — Emergency Department (HOSPITAL_COMMUNITY)
Admission: EM | Admit: 2013-06-03 | Discharge: 2013-06-03 | Disposition: A | Discharge status: Home / Self Care | Payer: Worker's Compensation | Attending: Emergency Medicine | Admitting: Emergency Medicine

## 2013-06-03 DIAGNOSIS — Z7982 Long term (current) use of aspirin: Secondary | ICD-10-CM | POA: Insufficient documentation

## 2013-06-03 DIAGNOSIS — Z8669 Personal history of other diseases of the nervous system and sense organs: Secondary | ICD-10-CM | POA: Insufficient documentation

## 2013-06-03 DIAGNOSIS — W010XXA Fall on same level from slipping, tripping and stumbling without subsequent striking against object, initial encounter: Secondary | ICD-10-CM | POA: Insufficient documentation

## 2013-06-03 DIAGNOSIS — Y929 Unspecified place or not applicable: Secondary | ICD-10-CM | POA: Insufficient documentation

## 2013-06-03 DIAGNOSIS — Y939 Activity, unspecified: Secondary | ICD-10-CM | POA: Insufficient documentation

## 2013-06-03 DIAGNOSIS — J45909 Unspecified asthma, uncomplicated: Secondary | ICD-10-CM | POA: Insufficient documentation

## 2013-06-03 DIAGNOSIS — Z79899 Other long term (current) drug therapy: Secondary | ICD-10-CM | POA: Insufficient documentation

## 2013-06-03 DIAGNOSIS — IMO0002 Reserved for concepts with insufficient information to code with codable children: Secondary | ICD-10-CM | POA: Insufficient documentation

## 2013-06-03 DIAGNOSIS — F17200 Nicotine dependence, unspecified, uncomplicated: Secondary | ICD-10-CM | POA: Insufficient documentation

## 2013-06-03 DIAGNOSIS — W19XXXA Unspecified fall, initial encounter: Secondary | ICD-10-CM

## 2013-06-03 DIAGNOSIS — E119 Type 2 diabetes mellitus without complications: Secondary | ICD-10-CM | POA: Insufficient documentation

## 2013-06-03 DIAGNOSIS — K219 Gastro-esophageal reflux disease without esophagitis: Secondary | ICD-10-CM | POA: Insufficient documentation

## 2013-06-03 DIAGNOSIS — I10 Essential (primary) hypertension: Secondary | ICD-10-CM | POA: Insufficient documentation

## 2013-06-03 MED ORDER — METHOCARBAMOL 500 MG PO TABS: 500.0000 mg | ORAL_TABLET | Freq: Two times a day (BID) | ORAL | Status: DC

## 2013-06-03 MED ORDER — OXYCODONE-ACETAMINOPHEN 5-325 MG PO TABS
2.0000 | ORAL_TABLET | Freq: Once | ORAL | Status: AC
  Administered 2013-06-03: 2 via ORAL
  Filled 2013-06-03: qty 2

## 2013-06-03 MED ORDER — OXYCODONE-ACETAMINOPHEN 5-325 MG PO TABS: 1.0000 | ORAL_TABLET | Freq: Four times a day (QID) | ORAL | Status: DC | PRN

## 2013-06-03 MED ORDER — NAPROXEN 500 MG PO TABS: 500.0000 mg | ORAL_TABLET | Freq: Two times a day (BID) | ORAL | Status: DC

## 2013-06-03 NOTE — ED Provider Notes (Signed)
CSN: [id number]     Arrival date & time 06/03/13  1055 History     First MD Initiated Contact with Patient 06/03/13 1101     Chief Complaint  Patient presents with  . Fall   (Consider location/radiation/quality/duration/timing/severity/associated sxs/prior Treatment) HPI Comments: Patient presenting with pain of her lower back and coccyx that has been present since a fall that occurred just prior to arrival.  She reports that she slipped on some Ginger Ale and landed on her buttocks.  She reports that she landed on a hard floor when she fell.  She denies hitting her head.  No LOC.  She has not attempted to ambulate since the fall.  She was brought into the ED by PTAR on a long spine board.  She has not taken anything for pain prior to arrival.  She denies any numbness or tingling.  Denies headache or neck pain.  Denies bowel or bladder incontinence.  Denies extremity pain.    The history is provided by the patient.    Past Medical History  Diagnosis Date  . Diabetes mellitus   . Hypertension   . Asthma   . GERD (gastroesophageal reflux disease)   . Migraines    Past Surgical History  Procedure Laterality Date  . Btl    . Tubal ligation     Family History  Problem Relation Age of Onset  . Diabetes Mother   . Hypertension Mother    History  Substance Use Topics  . Smoking status: Current Every Day Smoker -- 0.25 packs/day    Types: Cigarettes  . Smokeless tobacco: Never Used  . Alcohol Use: No   OB History   Grav Para Term Preterm Abortions TAB SAB Ect Mult Living                 Review of Systems  Musculoskeletal: Positive for back pain.  All other systems reviewed and are negative.    Allergies  Pneumococcal vaccines and Vicodin  Home Medications   Current Outpatient Rx  Name  Route  Sig  Dispense  Refill  . albuterol (PROVENTIL HFA;VENTOLIN HFA) 108 (90 BASE) MCG/ACT inhaler   Inhalation   Inhale 2 puffs into the lungs every 6 (six) hours as needed.  wheezing   1 Inhaler   2   . amLODipine (NORVASC) 5 MG tablet   Oral   Take 1 tablet (5 mg total) by mouth daily.   30 tablet   1   . aspirin EC 81 MG tablet   Oral   Take 81 mg by mouth daily.         Marland Kitchen atorvastatin (LIPITOR) 20 MG tablet   Oral   Take 1 tablet (20 mg total) by mouth daily.   30 tablet   1   . ferrous sulfate 325 (65 FE) MG tablet   Oral   Take 325 mg by mouth 2 (two) times daily before a meal.         . Fluticasone-Salmeterol (ADVAIR DISKUS) 250-50 MCG/DOSE AEPB   Inhalation   Inhale 1 puff into the lungs every 12 (twelve) hours.   60 each   1   . lisinopril-hydrochlorothiazide (PRINZIDE,ZESTORETIC) 20-12.5 MG per tablet   Oral   Take 1 tablet by mouth daily.           . metFORMIN (GLUCOPHAGE) 1000 MG tablet   Oral   Take 1,000 mg by mouth 2 (two) times daily with a meal.           .  pantoprazole (PROTONIX) 40 MG tablet   Oral   Take 40 mg by mouth daily.            BP 161/91  Pulse 89  Temp(Src) 98.1 F (36.7 C) (Oral)  Resp 20  SpO2 100%  LMP 05/27/2013 Physical Exam  Nursing note and vitals reviewed. Constitutional: She appears well-developed and well-nourished.  HENT:  Head: Normocephalic and atraumatic.  Neck: Normal range of motion. Neck supple.  Cardiovascular: Normal rate, regular rhythm and normal heart sounds.   Pulmonary/Chest: Effort normal and breath sounds normal.  Musculoskeletal: Normal range of motion.       Cervical back: She exhibits normal range of motion, no tenderness, no bony tenderness, no swelling, no edema and no deformity.       Thoracic back: She exhibits normal range of motion, no tenderness, no bony tenderness, no swelling, no edema and no deformity.       Lumbar back: She exhibits tenderness and bony tenderness. She exhibits normal range of motion, no swelling, no edema and no deformity.  Tenderness to palpation of the coccyx. Full ROM of all extremities  Neurological: She is alert. She has  normal strength. No sensory deficit. Gait normal.  Skin: Skin is warm and dry.  Psychiatric: She has a normal mood and affect.    ED Course   Procedures (including critical care time)  Labs Reviewed - No data to display Dg Lumbar Spine Complete  06/03/2013   *RADIOLOGY REPORT*  Clinical Data: History of injury from fall with pain in the low back.  LUMBAR SPINE - COMPLETE 4+ VIEW  Comparison: None.  Findings: There are five lumbar-type vertebral bodies.  There is narrowing of the L4-L5 intervertebral disc space.  There is multilevel marginal osteophyte formation representing degenerative spondylosis.  This is most severe at the L4-L5 level.  There is also posterior bony spurring at the L4-L5 level.  There is subtle slight posterior slippage or subluxation of the body of L3 on the body of L4.  Pelvic phleboliths are seen.  No pars defects are seen.  Apophyseal joint degenerative spondylosis is present in the lower lumbar spine at the lumbosacral junction.  IMPRESSION: Changes of degenerative disc disease and degenerative spondylosis. This is most severe at the L4-L5 level.  No fracture or dislocation is evident.  Subtle slight posterior slippage or subluxation of the body of L3 on body of L4.   Original Report Authenticated By: Onalee Hua Call   Dg Pelvis 1-2 Views  06/03/2013   *RADIOLOGY REPORT*  Clinical Data: Back and left hip pain.  PELVIS - 1-2 VIEW  Comparison: 06/03/2013.  Findings: Both hips are normally located.  No significant degenerative changes or acute bony findings.  No plain film evidence of avascular necrosis.  The pubic symphysis and SI joints are intact.  No pelvic fractures.  IMPRESSION: No acute bony findings.   Original Report Authenticated By: Rudie Meyer, M.D.   Dg Sacrum/coccyx  06/03/2013   *RADIOLOGY REPORT*  Clinical Data: Larey Seat.  Back pain.  SACRUM AND COCCYX - 2+ VIEW  Comparison: None  Findings: The hips are normally located.  No acute fracture.  The pubic symphysis and SI  joints are intact.  No pelvic fractures.  No sacral fracture.  IMPRESSION: No acute bony findings.   Original Report Authenticated By: Rudie Meyer, M.D.   No diagnosis found.  Assessed gait.  Patient able to ambulate. MDM  Patient presenting with lower back pain and pain of the buttocks after a  fall that occurred just prior to arrival.  She denies hitting her head.  No acute findings on xrays.  Patient able to ambulate.  Patient stable for discharge.  Patient instructed to follow up with PCP.  Return precautions given.  Pascal Lux [place], PA-C 06/03/13 [phone]

## 2013-06-03 NOTE — ED Notes (Signed)
Pt arrived by PTAR. Pt went to a pt room and slipped on ginger ale on floor. Pt stated to PTAR that she "fell straight on her butt"  No LOC, did not hit head. Currently  VS: bp-160palp hr96 reg res-22 O2sat-98%ra  labored d/t pain. CBG 114 Lung sounds clear

## 2013-06-04 NOTE — ED Provider Notes (Signed)
Medical screening examination/treatment/procedure(s) were performed by non-physician practitioner and as supervising physician I was immediately available for consultation/collaboration.  Charles B. Sheldon, MD 06/04/13 0717 

## 2013-06-06 ENCOUNTER — Emergency Department (HOSPITAL_COMMUNITY): Payer: Worker's Compensation

## 2013-06-06 ENCOUNTER — Encounter (HOSPITAL_COMMUNITY): Payer: Self-pay | Admitting: Emergency Medicine

## 2013-06-06 ENCOUNTER — Emergency Department (HOSPITAL_COMMUNITY)
Admission: EM | Admit: 2013-06-06 | Discharge: 2013-06-06 | Disposition: A | Discharge status: Home / Self Care | Payer: Worker's Compensation | Attending: Emergency Medicine | Admitting: Emergency Medicine

## 2013-06-06 DIAGNOSIS — S0083XA Contusion of other part of head, initial encounter: Secondary | ICD-10-CM | POA: Insufficient documentation

## 2013-06-06 DIAGNOSIS — S0003XA Contusion of scalp, initial encounter: Secondary | ICD-10-CM | POA: Insufficient documentation

## 2013-06-06 DIAGNOSIS — Z7982 Long term (current) use of aspirin: Secondary | ICD-10-CM | POA: Insufficient documentation

## 2013-06-06 DIAGNOSIS — F17200 Nicotine dependence, unspecified, uncomplicated: Secondary | ICD-10-CM | POA: Insufficient documentation

## 2013-06-06 DIAGNOSIS — E119 Type 2 diabetes mellitus without complications: Secondary | ICD-10-CM | POA: Insufficient documentation

## 2013-06-06 DIAGNOSIS — R51 Headache: Secondary | ICD-10-CM | POA: Insufficient documentation

## 2013-06-06 DIAGNOSIS — Y929 Unspecified place or not applicable: Secondary | ICD-10-CM | POA: Insufficient documentation

## 2013-06-06 DIAGNOSIS — S39012D Strain of muscle, fascia and tendon of lower back, subsequent encounter: Secondary | ICD-10-CM

## 2013-06-06 DIAGNOSIS — Z79899 Other long term (current) drug therapy: Secondary | ICD-10-CM | POA: Insufficient documentation

## 2013-06-06 DIAGNOSIS — S0093XD Contusion of unspecified part of head, subsequent encounter: Secondary | ICD-10-CM

## 2013-06-06 DIAGNOSIS — M542 Cervicalgia: Secondary | ICD-10-CM | POA: Insufficient documentation

## 2013-06-06 DIAGNOSIS — J45909 Unspecified asthma, uncomplicated: Secondary | ICD-10-CM | POA: Insufficient documentation

## 2013-06-06 DIAGNOSIS — Z885 Allergy status to narcotic agent status: Secondary | ICD-10-CM | POA: Insufficient documentation

## 2013-06-06 DIAGNOSIS — K219 Gastro-esophageal reflux disease without esophagitis: Secondary | ICD-10-CM | POA: Insufficient documentation

## 2013-06-06 DIAGNOSIS — S39012A Strain of muscle, fascia and tendon of lower back, initial encounter: Secondary | ICD-10-CM

## 2013-06-06 DIAGNOSIS — S239XXA Sprain of unspecified parts of thorax, initial encounter: Secondary | ICD-10-CM | POA: Insufficient documentation

## 2013-06-06 DIAGNOSIS — W19XXXD Unspecified fall, subsequent encounter: Secondary | ICD-10-CM

## 2013-06-06 DIAGNOSIS — S0093XA Contusion of unspecified part of head, initial encounter: Secondary | ICD-10-CM

## 2013-06-06 DIAGNOSIS — Z888 Allergy status to other drugs, medicaments and biological substances status: Secondary | ICD-10-CM | POA: Insufficient documentation

## 2013-06-06 DIAGNOSIS — Z8669 Personal history of other diseases of the nervous system and sense organs: Secondary | ICD-10-CM | POA: Insufficient documentation

## 2013-06-06 DIAGNOSIS — Y990 Civilian activity done for income or pay: Secondary | ICD-10-CM | POA: Insufficient documentation

## 2013-06-06 DIAGNOSIS — I10 Essential (primary) hypertension: Secondary | ICD-10-CM | POA: Insufficient documentation

## 2013-06-06 DIAGNOSIS — M79609 Pain in unspecified limb: Secondary | ICD-10-CM | POA: Insufficient documentation

## 2013-06-06 DIAGNOSIS — W19XXXA Unspecified fall, initial encounter: Secondary | ICD-10-CM | POA: Insufficient documentation

## 2013-06-06 MED ORDER — IBUPROFEN 800 MG PO TABS
800.0000 mg | ORAL_TABLET | Freq: Once | ORAL | Status: AC
  Administered 2013-06-06: 800 mg via ORAL
  Filled 2013-06-06: qty 1

## 2013-06-06 MED ORDER — HYDROMORPHONE HCL PF 1 MG/ML IJ SOLN
1.0000 mg | Freq: Once | INTRAMUSCULAR | Status: AC
  Administered 2013-06-06: 1 mg via INTRAMUSCULAR
  Filled 2013-06-06: qty 1

## 2013-06-06 NOTE — ED Notes (Addendum)
To ED via private vehicle from home with c/o continued pain after fall on Thursday, head and back hurting now-- was only having pain in tailbone on thursday

## 2013-06-06 NOTE — ED Provider Notes (Signed)
CSN: [id number]     Arrival date & time 06/06/13  1478 History     First MD Initiated Contact with Patient 06/06/13 1004     No chief complaint on file.  (Consider location/radiation/quality/duration/timing/severity/associated sxs/prior Treatment) Patient is a 47 y.o. female presenting with fall. The history is provided by the patient.  Fall This is a new problem. Episode onset: 3 days ago. Episode frequency: once. The problem has been gradually worsening. Associated symptoms include headaches. Pertinent negatives include no chest pain, no abdominal pain and no shortness of breath. Exacerbated by: movement. Relieved by: percocet was helping initially. Treatments tried: percocet. The treatment provided mild relief.    Past Medical History  Diagnosis Date  . Diabetes mellitus   . Hypertension   . Asthma   . GERD (gastroesophageal reflux disease)   . Migraines    Past Surgical History  Procedure Laterality Date  . Btl    . Tubal ligation     Family History  Problem Relation Age of Onset  . Diabetes Mother   . Hypertension Mother    History  Substance Use Topics  . Smoking status: Current Every Day Smoker -- 0.25 packs/day    Types: Cigarettes  . Smokeless tobacco: Never Used  . Alcohol Use: No   OB History   Grav Para Term Preterm Abortions TAB SAB Ect Mult Living                 Review of Systems  Constitutional: Negative for fever and fatigue.  HENT: Negative for congestion, drooling and neck pain.   Eyes: Negative for pain.  Respiratory: Negative for cough and shortness of breath.   Cardiovascular: Negative for chest pain.  Gastrointestinal: Negative for nausea, vomiting, abdominal pain and diarrhea.  Genitourinary: Negative for dysuria and hematuria.  Musculoskeletal: Positive for back pain. Negative for gait problem.       Shooting pains down both upper arms.   Skin: Negative for color change.  Neurological: Positive for headaches. Negative for dizziness.   Hematological: Negative for adenopathy.  Psychiatric/Behavioral: Negative for behavioral problems.  All other systems reviewed and are negative.    Allergies  Pneumococcal vaccines and Vicodin  Home Medications   Current Outpatient Rx  Name  Route  Sig  Dispense  Refill  . albuterol (PROVENTIL HFA;VENTOLIN HFA) 108 (90 BASE) MCG/ACT inhaler   Inhalation   Inhale 2 puffs into the lungs every 6 (six) hours as needed for wheezing.         Marland Kitchen amLODipine (NORVASC) 5 MG tablet   Oral   Take 1 tablet (5 mg total) by mouth daily.   30 tablet   1   . aspirin EC 81 MG tablet   Oral   Take 81 mg by mouth daily.         Marland Kitchen atorvastatin (LIPITOR) 20 MG tablet   Oral   Take 1 tablet (20 mg total) by mouth daily.   30 tablet   1   . ferrous sulfate 325 (65 FE) MG tablet   Oral   Take 325 mg by mouth 2 (two) times daily before a meal.         . Fluticasone-Salmeterol (ADVAIR DISKUS) 250-50 MCG/DOSE AEPB   Inhalation   Inhale 1 puff into the lungs every 12 (twelve) hours.   60 each   1   . lisinopril-hydrochlorothiazide (PRINZIDE,ZESTORETIC) 20-12.5 MG per tablet   Oral   Take 1 tablet by mouth daily.           Marland Kitchen  metFORMIN (GLUCOPHAGE) 1000 MG tablet   Oral   Take 1,000 mg by mouth 2 (two) times daily with a meal.           . methocarbamol (ROBAXIN) 500 MG tablet   Oral   Take 1 tablet (500 mg total) by mouth 2 (two) times daily.   20 tablet   0   . naproxen (NAPROSYN) 500 MG tablet   Oral   Take 1 tablet (500 mg total) by mouth 2 (two) times daily.   30 tablet   0   . oxyCODONE-acetaminophen (PERCOCET/ROXICET) 5-325 MG per tablet   Oral   Take 1-2 tablets by mouth every 6 (six) hours as needed for pain.   20 tablet   0   . pantoprazole (PROTONIX) 40 MG tablet   Oral   Take 40 mg by mouth daily.            BP 192/108  Pulse 99  Temp(Src) 97.9 F (36.6 C) (Oral)  Resp 20  SpO2 97%  LMP 05/27/2013 Physical Exam  Nursing note and vitals  reviewed. Constitutional: She is oriented to person, place, and time. She appears well-developed and well-nourished.  obese  HENT:  Head: Normocephalic.  Mouth/Throat: No oropharyngeal exudate.  Eyes: Conjunctivae and EOM are normal. Pupils are equal, round, and reactive to light.  Neck: Normal range of motion. Neck supple.  Diffuse spinal ttp. Diffuse ttp of musculature of the neck.   Cardiovascular: Normal rate, regular rhythm, normal heart sounds and intact distal pulses.  Exam reveals no gallop and no friction rub.   No murmur heard. Pulmonary/Chest: Effort normal and breath sounds normal. No respiratory distress. She has no wheezes.  Abdominal: Soft. Bowel sounds are normal. There is no tenderness. There is no rebound and no guarding.  Musculoskeletal: Normal range of motion. She exhibits no edema and no tenderness.  Normal sensation/strength in bilateral UE's. 2+ distal pulses.   Neurological: She is alert and oriented to person, place, and time. She has normal strength. No sensory deficit.  Skin: Skin is warm and dry.  Psychiatric: She has a normal mood and affect. Her behavior is normal.    ED Course   Procedures (including critical care time)  Labs Reviewed - No data to display Dg Thoracic Spine W/swimmers  06/06/2013   *RADIOLOGY REPORT*  Clinical Data: Fall with mid back pain.  THORACIC SPINE - 2 VIEW + SWIMMERS  Comparison: 07/02/2012 chest radiograph  Findings: Normal alignment is noted. There is no evidence of fracture or subluxation. No focal bony lesions are identified. The disc spaces are maintained.  IMPRESSION: No evidence of acute bony abnormality.   Original Report Authenticated By: Harmon Pier, M.D.   Ct Head Wo Contrast  06/06/2013   *RADIOLOGY REPORT*  Clinical Data:  Fall.  Diabetic hypertensive patient with history migraines.  CT HEAD WITHOUT CONTRAST CT CERVICAL SPINE WITHOUT CONTRAST  Technique:  Multidetector CT imaging of the head and cervical spine was  performed following the standard protocol without intravenous contrast.  Multiplanar CT image reconstructions of the cervical spine were also generated.  Comparison:  04/03/2009 head CT.  03/25/2006 neck CT.  CT HEAD  Findings: No skull fracture or intracranial hemorrhage.  No CT evidence of large acute infarct.  No intracranial mass lesion detected on this unenhanced exam.  Exophthalmos.  Paranasal sinuses, middle ear cavities and mastoid air cells are clear.  IMPRESSION: No skull fracture or intracranial hemorrhage.  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  Evaluation of the lower cervical spine upper thoracic spine somewhat limited by artifact caused by patient's shoulders.  No obvious acute cervical spine fracture.  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  Lung apices clear.  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  Evaluation of the lower cervical spine upper thoracic spine somewhat limited by artifact caused by patient's shoulders.  No acute cervical spine fracture.  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   Original Report Authenticated By: Lacy Duverney, M.D.   Ct Cervical Spine Wo Contrast  06/06/2013   *RADIOLOGY REPORT*  Clinical Data:  Fall.  Diabetic hypertensive patient with history migraines.  CT HEAD WITHOUT CONTRAST CT CERVICAL SPINE WITHOUT CONTRAST  Technique:  Multidetector CT imaging of the head and cervical spine was performed following the standard protocol without intravenous contrast.  Multiplanar CT image reconstructions of the cervical spine were also generated.  Comparison:  04/03/2009 head CT.  03/25/2006 neck CT.  CT HEAD  Findings: No skull fracture or intracranial hemorrhage.  No CT evidence of large acute infarct.  No intracranial mass lesion detected on this unenhanced exam.   Exophthalmos.  Paranasal sinuses, middle ear cavities and mastoid air cells are clear.  IMPRESSION: No skull fracture or intracranial hemorrhage.  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  Evaluation of the lower cervical spine upper thoracic spine somewhat limited by artifact caused by patient's shoulders.  No obvious acute cervical spine fracture.  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  Lung apices clear.  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  Evaluation of the lower cervical spine upper thoracic spine somewhat limited by artifact caused by patient's shoulders.  No acute cervical spine fracture.  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   Original Report Authenticated By: Lacy Duverney, M.D.   1. Fall, subsequent encounter   2. Back strain, subsequent encounter   3. Contusion of head, subsequent encounter     MDM  The patient is a 47 year old female who presents with back pain since a fall at work 3 days ago. The patient was seen here at that time with negative plain film imaging and went home with pain medicine. The pain medicine helped initially the patient notes worsening pain in the musculature of her neck and headache since yesterday. Also shooting type pains down both her arms. Possible radiculopathy. No focal weakness or sensation deficit. Will get CT imaging, pain control.  12:34 PM: Pt feeling better.  I have discussed the diagnosis/risks/treatment options with the patient and believe the pt to be eligible for discharge home to follow-up with pcp. We also discussed returning to the ED immediately if new or worsening sx occur. We discussed the sx which are most concerning (e.g., worsening pain, numbness, weakness) that necessitate immediate return. Any new prescriptions provided to the patient are  listed below.    Junius Argyle, MD 06/06/13 [phone]

## 2013-11-16 ENCOUNTER — Emergency Department (HOSPITAL_COMMUNITY): Payer: BC Managed Care – PPO

## 2013-11-16 ENCOUNTER — Emergency Department (HOSPITAL_COMMUNITY)
Admission: EM | Admit: 2013-11-16 | Discharge: 2013-11-16 | Disposition: A | Discharge status: Home / Self Care | Payer: BC Managed Care – PPO | Attending: Emergency Medicine | Admitting: Emergency Medicine

## 2013-11-16 ENCOUNTER — Encounter (HOSPITAL_COMMUNITY): Payer: Self-pay | Admitting: Emergency Medicine

## 2013-11-16 DIAGNOSIS — K219 Gastro-esophageal reflux disease without esophagitis: Secondary | ICD-10-CM | POA: Insufficient documentation

## 2013-11-16 DIAGNOSIS — F17200 Nicotine dependence, unspecified, uncomplicated: Secondary | ICD-10-CM | POA: Insufficient documentation

## 2013-11-16 DIAGNOSIS — B9789 Other viral agents as the cause of diseases classified elsewhere: Secondary | ICD-10-CM

## 2013-11-16 DIAGNOSIS — J45909 Unspecified asthma, uncomplicated: Secondary | ICD-10-CM | POA: Insufficient documentation

## 2013-11-16 DIAGNOSIS — I10 Essential (primary) hypertension: Secondary | ICD-10-CM | POA: Insufficient documentation

## 2013-11-16 DIAGNOSIS — Z791 Long term (current) use of non-steroidal anti-inflammatories (NSAID): Secondary | ICD-10-CM | POA: Insufficient documentation

## 2013-11-16 DIAGNOSIS — E785 Hyperlipidemia, unspecified: Secondary | ICD-10-CM | POA: Insufficient documentation

## 2013-11-16 DIAGNOSIS — J069 Acute upper respiratory infection, unspecified: Secondary | ICD-10-CM | POA: Insufficient documentation

## 2013-11-16 DIAGNOSIS — E119 Type 2 diabetes mellitus without complications: Secondary | ICD-10-CM | POA: Insufficient documentation

## 2013-11-16 DIAGNOSIS — Z7982 Long term (current) use of aspirin: Secondary | ICD-10-CM | POA: Insufficient documentation

## 2013-11-16 DIAGNOSIS — Z79899 Other long term (current) drug therapy: Secondary | ICD-10-CM | POA: Insufficient documentation

## 2013-11-16 DIAGNOSIS — IMO0002 Reserved for concepts with insufficient information to code with codable children: Secondary | ICD-10-CM | POA: Insufficient documentation

## 2013-11-16 HISTORY — DX: Hyperlipidemia, unspecified: E78.5

## 2013-11-16 MED ORDER — ACETAMINOPHEN 500 MG PO TABS
1000.0000 mg | ORAL_TABLET | Freq: Once | ORAL | Status: AC
  Administered 2013-11-16: 1000 mg via ORAL
  Filled 2013-11-16: qty 2

## 2013-11-16 MED ORDER — ALBUTEROL SULFATE HFA 108 (90 BASE) MCG/ACT IN AERS
4.0000 | INHALATION_SPRAY | Freq: Once | RESPIRATORY_TRACT | Status: AC
  Administered 2013-11-16: 4 via RESPIRATORY_TRACT
  Filled 2013-11-16: qty 6.7

## 2013-11-16 NOTE — ED Notes (Signed)
Mask placed on pt at triage

## 2013-11-16 NOTE — Discharge Instructions (Signed)

## 2013-11-16 NOTE — ED Notes (Signed)
Returned from xray

## 2013-11-16 NOTE — ED Notes (Signed)
Patient transported to X-ray 

## 2013-11-16 NOTE — ED Notes (Signed)
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

## 2013-11-16 NOTE — ED Notes (Signed)
Pt reports she took her flu shot this year.  Works at [employer] where flu and pneumonia has been occurring.  States she began feeling head stuffiness, cough, generalized body aches.  Feels miserable

## 2013-11-16 NOTE — ED Provider Notes (Signed)
CSN: [id number]     Arrival date & time 11/16/13  0259 History   First MD Initiated Contact with Patient 11/16/13 [phone]     Chief Complaint  Patient presents with  . Influenza   (Consider location/radiation/quality/duration/timing/severity/associated sxs/prior Treatment) Patient is a 48 y.o. female presenting with URI.  URI Presenting symptoms: congestion, cough, facial pain, fever and sore throat   Severity:  Severe Onset quality:  Gradual Duration:  3 days Timing:  Constant Progression:  Worsening Chronicity:  New Relieved by: Naprosyn, Robitussin. Worsened by:  Nothing tried Ineffective treatments:  None tried Associated symptoms: headaches and myalgias   Risk factors: sick contacts     Past Medical History  Diagnosis Date  . Diabetes mellitus   . Hypertension   . Asthma   . GERD (gastroesophageal reflux disease)   . Migraines   . Hyperlipidemia    Past Surgical History  Procedure Laterality Date  . Btl    . Tubal ligation     Family History  Problem Relation Age of Onset  . Diabetes Mother   . Hypertension Mother    History  Substance Use Topics  . Smoking status: Current Every Day Smoker -- 0.25 packs/day    Types: Cigarettes  . Smokeless tobacco: Never Used  . Alcohol Use: No   OB History   Grav Para Term Preterm Abortions TAB SAB Ect Mult Living                 Review of Systems  Constitutional: Positive for fever.  HENT: Positive for congestion and sore throat.   Respiratory: Positive for cough. Negative for shortness of breath.   Cardiovascular: Negative for chest pain.  Gastrointestinal: Negative for nausea, vomiting, abdominal pain and diarrhea.  Musculoskeletal: Positive for myalgias.  Neurological: Positive for headaches.  All other systems reviewed and are negative.    Allergies  Pneumococcal vaccines and Vicodin  Home Medications   Current Outpatient Rx  Name  Route  Sig  Dispense  Refill  . albuterol (PROVENTIL HFA;VENTOLIN HFA)  108 (90 BASE) MCG/ACT inhaler   Inhalation   Inhale 2 puffs into the lungs every 6 (six) hours as needed for wheezing.         Marland Kitchen amLODipine (NORVASC) 5 MG tablet   Oral   Take 1 tablet (5 mg total) by mouth daily.   30 tablet   1   . aspirin EC 81 MG tablet   Oral   Take 81 mg by mouth daily.         Marland Kitchen atorvastatin (LIPITOR) 20 MG tablet   Oral   Take 1 tablet (20 mg total) by mouth daily.   30 tablet   1   . ferrous sulfate 325 (65 FE) MG tablet   Oral   Take 325 mg by mouth 2 (two) times daily before a meal.         . Fluticasone-Salmeterol (ADVAIR DISKUS) 250-50 MCG/DOSE AEPB   Inhalation   Inhale 1 puff into the lungs every 12 (twelve) hours.   60 each   1   . lisinopril-hydrochlorothiazide (PRINZIDE,ZESTORETIC) 20-12.5 MG per tablet   Oral   Take 1 tablet by mouth daily.           . metFORMIN (GLUCOPHAGE) 1000 MG tablet   Oral   Take 1,000 mg by mouth 2 (two) times daily with a meal.           . methocarbamol (ROBAXIN) 500 MG tablet   Oral  Take 1 tablet (500 mg total) by mouth 2 (two) times daily.   20 tablet   0   . naproxen (NAPROSYN) 500 MG tablet   Oral   Take 1 tablet (500 mg total) by mouth 2 (two) times daily.   30 tablet   0   . oxyCODONE-acetaminophen (PERCOCET/ROXICET) 5-325 MG per tablet   Oral   Take 1-2 tablets by mouth every 6 (six) hours as needed for pain.   20 tablet   0   . pantoprazole (PROTONIX) 40 MG tablet   Oral   Take 40 mg by mouth daily.            BP 165/93  Pulse 81  Temp(Src) 97.8 F (36.6 C) (Oral)  Resp 18  Ht 5\' 11"  (1.803 m)  Wt 276 lb (125.193 kg)  BMI 38.51 kg/m2  SpO2 98% Physical Exam  Nursing note and vitals reviewed. Constitutional: She is oriented to person, place, and time. She appears well-developed and well-nourished. No distress.  HENT:  Head: Normocephalic and atraumatic.  Nose: Right sinus exhibits maxillary sinus tenderness and frontal sinus tenderness. Left sinus exhibits  maxillary sinus tenderness and frontal sinus tenderness.  Mouth/Throat: Mucous membranes are normal. Posterior oropharyngeal erythema present. No oropharyngeal exudate, posterior oropharyngeal edema or tonsillar abscesses.  Eyes: Conjunctivae are normal. Pupils are equal, round, and reactive to light. No scleral icterus.  Neck: Neck supple.  Cardiovascular: Normal rate, regular rhythm, normal heart sounds and intact distal pulses.   No murmur heard. Pulmonary/Chest: Effort normal and breath sounds normal. No stridor. No respiratory distress. She has no wheezes. She has no rales.  Abdominal: Soft. Bowel sounds are normal. She exhibits no distension. There is no tenderness.  Musculoskeletal: Normal range of motion.  Neurological: She is alert and oriented to person, place, and time.  Skin: Skin is warm and dry. No rash noted.  Psychiatric: She has a normal mood and affect. Her behavior is normal.    ED Course  Procedures (including critical care time) Labs Review Labs Reviewed - No data to display Imaging Review Dg Chest 2 View  11/16/2013   CLINICAL DATA:  Cough for 2 days.  EXAM: CHEST  2 VIEW  COMPARISON:  DG CHEST 2 VIEW dated 07/02/2012  FINDINGS: The lungs are well-aerated and clear. There is no evidence of focal opacification, pleural effusion or pneumothorax.  The heart is borderline normal in size; the mediastinal contour is within normal limits. No acute osseous abnormalities are seen.  IMPRESSION: No acute cardiopulmonary process seen.   Electronically Signed   By: Garald Balding M.D.   On: 11/16/2013 04:15  All radiology studies independently viewed by me.     EKG Interpretation   None       MDM   1. Viral URI with cough    48 year old female with URI symptoms and myalgias. Low-grade temps for a few days. Has a history of asthma, so will check chest x-ray. However, she has no wheezing currently. Albuterol has seemed to help her cough and chest tightness for the past few days,  so will give her a dose of that here. Will also give her Tylenol.  She got her shot. However, her symptoms could represent influenza. I do not think that Tamiflu will be a to her, even with her history of asthma (which appears to be well-controlled.)  CXR normal.  DC home.   Houston Siren, MD 11/16/13 [phone]

## 2013-11-18 ENCOUNTER — Encounter (HOSPITAL_COMMUNITY): Payer: Self-pay | Admitting: Emergency Medicine

## 2013-11-18 ENCOUNTER — Emergency Department (HOSPITAL_COMMUNITY)
Admission: EM | Admit: 2013-11-18 | Discharge: 2013-11-18 | Disposition: A | Discharge status: Home / Self Care | Payer: BC Managed Care – PPO | Attending: Emergency Medicine | Admitting: Emergency Medicine

## 2013-11-18 ENCOUNTER — Emergency Department (HOSPITAL_COMMUNITY): Payer: BC Managed Care – PPO

## 2013-11-18 DIAGNOSIS — R0789 Other chest pain: Secondary | ICD-10-CM | POA: Insufficient documentation

## 2013-11-18 DIAGNOSIS — Z7982 Long term (current) use of aspirin: Secondary | ICD-10-CM | POA: Insufficient documentation

## 2013-11-18 DIAGNOSIS — Z79899 Other long term (current) drug therapy: Secondary | ICD-10-CM | POA: Insufficient documentation

## 2013-11-18 DIAGNOSIS — F17200 Nicotine dependence, unspecified, uncomplicated: Secondary | ICD-10-CM | POA: Insufficient documentation

## 2013-11-18 DIAGNOSIS — E785 Hyperlipidemia, unspecified: Secondary | ICD-10-CM | POA: Insufficient documentation

## 2013-11-18 DIAGNOSIS — R112 Nausea with vomiting, unspecified: Secondary | ICD-10-CM | POA: Insufficient documentation

## 2013-11-18 DIAGNOSIS — I10 Essential (primary) hypertension: Secondary | ICD-10-CM | POA: Insufficient documentation

## 2013-11-18 DIAGNOSIS — J45901 Unspecified asthma with (acute) exacerbation: Secondary | ICD-10-CM | POA: Insufficient documentation

## 2013-11-18 DIAGNOSIS — Z791 Long term (current) use of non-steroidal anti-inflammatories (NSAID): Secondary | ICD-10-CM | POA: Insufficient documentation

## 2013-11-18 DIAGNOSIS — Z8719 Personal history of other diseases of the digestive system: Secondary | ICD-10-CM | POA: Insufficient documentation

## 2013-11-18 DIAGNOSIS — J069 Acute upper respiratory infection, unspecified: Secondary | ICD-10-CM | POA: Insufficient documentation

## 2013-11-18 DIAGNOSIS — E119 Type 2 diabetes mellitus without complications: Secondary | ICD-10-CM | POA: Insufficient documentation

## 2013-11-18 DIAGNOSIS — G43909 Migraine, unspecified, not intractable, without status migrainosus: Secondary | ICD-10-CM | POA: Insufficient documentation

## 2013-11-18 MED ORDER — PREDNISONE 20 MG PO TABS
60.0000 mg | ORAL_TABLET | Freq: Once | ORAL | Status: AC
  Administered 2013-11-18: 60 mg via ORAL
  Filled 2013-11-18: qty 3

## 2013-11-18 MED ORDER — PREDNISONE 20 MG PO TABS: 40.0000 mg | ORAL_TABLET | Freq: Every day | ORAL | Status: DC

## 2013-11-18 MED ORDER — ALBUTEROL SULFATE (2.5 MG/3ML) 0.083% IN NEBU
5.0000 mg | INHALATION_SOLUTION | Freq: Once | RESPIRATORY_TRACT | Status: AC
  Administered 2013-11-18: 5 mg via RESPIRATORY_TRACT
  Filled 2013-11-18: qty 6

## 2013-11-18 MED ORDER — IPRATROPIUM BROMIDE 0.02 % IN SOLN
0.5000 mg | Freq: Once | RESPIRATORY_TRACT | Status: AC
  Administered 2013-11-18: 0.5 mg via RESPIRATORY_TRACT
  Filled 2013-11-18: qty 2.5

## 2013-11-18 NOTE — ED Provider Notes (Signed)
CSN: [id number]     Arrival date & time 11/18/13  1747 History  This chart was scribed for non-physician practitioner Carlisle Cater, PA-C working with Wandra Arthurs, MD by Eston Mould, ED Scribe. This patient was seen in room WTR8/WTR8 and the patient's care was started at 7:08 PM .   Chief Complaint  Patient presents with  . Asthma   The history is provided by the patient. No language interpreter was used.  HPI Comments: Sydney Williams is a 48 y.o. Female with a hx of asthma who presents to the Emergency Department complaining of SOB, chest tightness in her lower rib cage, and wheezing that began yesterday. Pt states she has been having cold sx (cough, rhinorrhea, sore throat, HA) that began 5 days ago. Pt states she has been having nausea and states she had 1 episode of emesis yesterday but believes this is due to excessive coughing. She states her asthma is exacerbated when she has cold sx. Pt states she has albuterol pump at home and states she uses it 4 x a day. Pt denies fever.  Past Medical History  Diagnosis Date  . Diabetes mellitus   . Hypertension   . Asthma   . GERD (gastroesophageal reflux disease)   . Migraines   . Hyperlipidemia    Past Surgical History  Procedure Laterality Date  . Btl    . Tubal ligation     Family History  Problem Relation Age of Onset  . Diabetes Mother   . Hypertension Mother    History  Substance Use Topics  . Smoking status: Current Every Day Smoker -- 0.25 packs/day    Types: Cigarettes  . Smokeless tobacco: Never Used  . Alcohol Use: No   OB History   Grav Para Term Preterm Abortions TAB SAB Ect Mult Living                 Review of Systems  Constitutional: Positive for chills. Negative for fever and fatigue.  HENT: Positive for rhinorrhea, sore throat and voice change. Negative for congestion, ear pain and sinus pressure.   Eyes: Negative for redness.  Respiratory: Positive for cough, chest tightness, shortness of breath  and wheezing.   Gastrointestinal: Positive for nausea. Negative for vomiting, abdominal pain and diarrhea.  Genitourinary: Negative for dysuria.  Musculoskeletal: Negative for myalgias and neck stiffness.  Skin: Negative for rash.  Neurological: Negative for headaches.  Hematological: Negative for adenopathy.    Allergies  Pneumococcal vaccines and Vicodin  Home Medications   Current Outpatient Rx  Name  Route  Sig  Dispense  Refill  . acyclovir (ZOVIRAX) 200 MG capsule   Oral   Take 200 mg by mouth 2 (two) times daily.         Marland Kitchen albuterol (PROVENTIL HFA;VENTOLIN HFA) 108 (90 BASE) MCG/ACT inhaler   Inhalation   Inhale 2 puffs into the lungs every 6 (six) hours as needed for wheezing.         Marland Kitchen amLODipine (NORVASC) 5 MG tablet   Oral   Take 1 tablet (5 mg total) by mouth daily.   30 tablet   1   . aspirin EC 81 MG tablet   Oral   Take 81 mg by mouth daily.         Marland Kitchen atorvastatin (LIPITOR) 20 MG tablet   Oral   Take 1 tablet (20 mg total) by mouth daily.   30 tablet   1   . ferrous sulfate 325 (65  FE) MG tablet   Oral   Take 325 mg by mouth 2 (two) times daily before a meal.         . Fluticasone-Salmeterol (ADVAIR) 250-50 MCG/DOSE AEPB   Inhalation   Inhale 1 puff into the lungs 2 (two) times daily.         Marland Kitchen lisinopril-hydrochlorothiazide (PRINZIDE,ZESTORETIC) 20-12.5 MG per tablet   Oral   Take 1 tablet by mouth daily.           . metFORMIN (GLUCOPHAGE) 500 MG tablet   Oral   Take 500 mg by mouth 2 (two) times daily with a meal.         . naproxen (NAPROSYN) 500 MG tablet   Oral   Take 1 tablet (500 mg total) by mouth 2 (two) times daily.   30 tablet   0   . oxyCODONE-acetaminophen (PERCOCET/ROXICET) 5-325 MG per tablet   Oral   Take 1-2 tablets by mouth every 6 (six) hours as needed for pain.   20 tablet   0    BP 152/94  Pulse 106  Temp(Src) 98.4 F (36.9 C) (Oral)  Resp 26  SpO2 96%  LMP 10/16/2013  Physical Exam   Nursing note and vitals reviewed. Constitutional: She appears well-developed and well-nourished.  HENT:  Head: Normocephalic and atraumatic.  Eyes: Conjunctivae are normal. Right eye exhibits no discharge. Left eye exhibits no discharge.  Neck: Normal range of motion. Neck supple.  Cardiovascular: Normal rate, regular rhythm and normal heart sounds.   Pulmonary/Chest: Effort normal. No respiratory distress. She has wheezes (expiratory, mild/moderate, scattered).  Abdominal: Soft. There is no tenderness.  Neurological: She is alert.  Skin: Skin is warm and dry.  Psychiatric: She has a normal mood and affect.    ED Course  Procedures DIAGNOSTIC STUDIES: Oxygen Saturation is 96% on RA, normal by my interpretation.    COORDINATION OF CARE: 7:16 PM-Discussed treatment plan which includes administer second albuterol tx while in ED. Pt agreed to plan.   Labs Review Labs Reviewed - No data to display Imaging Review Dg Chest 2 View  11/18/2013   CLINICAL DATA:  Short of breath and wheezing for 2 days. Smoker with asthma and hypertension.  EXAM: CHEST  2 VIEW  COMPARISON:  DG CHEST 2 VIEW dated 11/16/2013; DG THORACIC SPINE W/SWIMMERS dated 06/06/2013; DG CHEST 2 VIEW dated 07/02/2012  FINDINGS: Midline trachea. Borderline cardiomegaly. Aortic atherosclerosis. No pleural effusion or pneumothorax. Increased density projecting over the lung bases is favored to be due to overlying soft tissues. Otherwise, lungs are clear.  IMPRESSION: Borderline cardiomegaly, without acute disease.  Age advanced aortic atherosclerosis.   Electronically Signed   By: Abigail Miyamoto M.D.   On: 11/18/2013 18:59    EKG Interpretation   None      Patient seen and examined. Additional treatment of albuterol and Atrovent ordered. Prednisone ordered.  Vital signs reviewed and are as follows: Filed Vitals:   11/18/13 2018  BP: 159/100  Pulse: 107  Temp:   Resp:   BP 159/100  Pulse 107  Temp(Src) 98.4 F (36.9 C)  (Oral)  Resp 26  SpO2 96%  LMP 10/16/2013  8:31 PM On repeat exam, only and expiratory wheezing remains. Patient states that she is feeling much better. She is comfortable with discharge to home with home albuterol and Advair. Will give burst of prednisone.  Patient urged to return with worsening symptoms or other concerns. Patient verbalized understanding and agrees with plan.  MDM   1. Asthma exacerbation   2. URI (upper respiratory infection)    Patient with asthma exacerbation likely related to upper respiratory tract infection. No concern for PNA given x-ray. Antibiotics not indicated. Conservative therapy indicated.    I personally performed the services described in this documentation, which was scribed in my presence. The recorded information has been reviewed and is accurate.    Carlisle Cater, PA-C 11/18/13 2033

## 2013-11-18 NOTE — ED Notes (Signed)
Initial Contact - pt sitting up in chair, med per [facility], pt reports only feeling "a little" better after previous neb.  Speaking full/clear sentences, ls with exp wheeze noted.  NAD.  Skin PWD.  Pt denies further needs/complaints at this time.

## 2013-11-18 NOTE — ED Notes (Signed)
Pt reports having an asthma attack that started on Monday. Pt states the last time she had an attack it was last year. Pt is speaking in full sentences and has an oxygen saturation of 96% on room air. Pt also reports flu like symptoms of cough, chills, and nausea.

## 2013-11-18 NOTE — ED Provider Notes (Signed)
Medical screening examination/treatment/procedure(s) were performed by non-physician practitioner and as supervising physician I was immediately available for consultation/collaboration.  EKG Interpretation   None         Wandra Arthurs, MD 11/18/13 2328

## 2013-11-18 NOTE — Discharge Instructions (Signed)
Please read and follow all provided instructions.  Your diagnoses today include:  1. Asthma exacerbation   2. URI (upper respiratory infection)     Tests performed today include:  Chest x-ray - no pneumonia  Vital signs. See below for your results today.   Medications prescribed:   Prednisone - steroid medicine   It is best to take this medication in the morning to prevent sleeping problems. If you are diabetic, monitor your blood sugar closely and stop taking Prednisone if blood sugar is over 300. Take with food to prevent stomach upset.    Albuterol inhaler - medication that opens up your airway  Use inhaler as follows: 1-2 puffs with spacer every 4 hours as needed for wheezing, cough, or shortness of breath.   Take any prescribed medications only as directed.  Home care instructions:  Follow any educational materials contained in this packet.  Follow-up instructions: Please follow-up with your primary care provider in the next 3 days for further evaluation of your symptoms and management of your asthma. If you do not have a primary care doctor -- see below for referral information.   Return instructions:   Please return to the Emergency Department if you experience worsening symptoms.  Please return with worsening wheezing, shortness of breath, or difficulty breathing.  Return with persistent fever above 101F.   Please return if you have any other emergent concerns.  Additional Information:  Your vital signs today were: BP 152/94   Pulse 106   Temp(Src) 98.4 F (36.9 C) (Oral)   Resp 26   SpO2 96%   LMP 10/16/2013 If your blood pressure (BP) was elevated above 135/85 this visit, please have this repeated by your doctor within one month. --------------

## 2013-12-06 ENCOUNTER — Other Ambulatory Visit: Payer: Self-pay

## 2013-12-06 DIAGNOSIS — Z1231 Encounter for screening mammogram for malignant neoplasm of breast: Secondary | ICD-10-CM

## 2013-12-08 ENCOUNTER — Ambulatory Visit
Admission: RE | Admit: 2013-12-08 | Discharge: 2013-12-08 | Disposition: A | Discharge status: Home / Self Care | Payer: BC Managed Care – PPO | Source: Ambulatory Visit

## 2013-12-08 DIAGNOSIS — Z1231 Encounter for screening mammogram for malignant neoplasm of breast: Secondary | ICD-10-CM

## 2014-02-03 ENCOUNTER — Emergency Department (HOSPITAL_COMMUNITY)
Admission: EM | Admit: 2014-02-03 | Discharge: 2014-02-03 | Disposition: A | Discharge status: Home / Self Care | Payer: BC Managed Care – PPO | Attending: Emergency Medicine | Admitting: Emergency Medicine

## 2014-02-03 ENCOUNTER — Encounter (HOSPITAL_COMMUNITY): Payer: Self-pay | Admitting: Emergency Medicine

## 2014-02-03 DIAGNOSIS — F17200 Nicotine dependence, unspecified, uncomplicated: Secondary | ICD-10-CM | POA: Insufficient documentation

## 2014-02-03 DIAGNOSIS — IMO0002 Reserved for concepts with insufficient information to code with codable children: Secondary | ICD-10-CM | POA: Insufficient documentation

## 2014-02-03 DIAGNOSIS — R071 Chest pain on breathing: Secondary | ICD-10-CM | POA: Insufficient documentation

## 2014-02-03 DIAGNOSIS — E119 Type 2 diabetes mellitus without complications: Secondary | ICD-10-CM | POA: Insufficient documentation

## 2014-02-03 DIAGNOSIS — J302 Other seasonal allergic rhinitis: Secondary | ICD-10-CM

## 2014-02-03 DIAGNOSIS — E785 Hyperlipidemia, unspecified: Secondary | ICD-10-CM | POA: Insufficient documentation

## 2014-02-03 DIAGNOSIS — I10 Essential (primary) hypertension: Secondary | ICD-10-CM | POA: Insufficient documentation

## 2014-02-03 DIAGNOSIS — Z79899 Other long term (current) drug therapy: Secondary | ICD-10-CM | POA: Insufficient documentation

## 2014-02-03 DIAGNOSIS — Z7982 Long term (current) use of aspirin: Secondary | ICD-10-CM | POA: Insufficient documentation

## 2014-02-03 DIAGNOSIS — R0989 Other specified symptoms and signs involving the circulatory and respiratory systems: Secondary | ICD-10-CM | POA: Insufficient documentation

## 2014-02-03 DIAGNOSIS — J309 Allergic rhinitis, unspecified: Secondary | ICD-10-CM | POA: Insufficient documentation

## 2014-02-03 DIAGNOSIS — J45901 Unspecified asthma with (acute) exacerbation: Secondary | ICD-10-CM

## 2014-02-03 MED ORDER — PREDNISONE 20 MG PO TABS: ORAL_TABLET | ORAL | Status: DC

## 2014-02-03 MED ORDER — IPRATROPIUM-ALBUTEROL 0.5-2.5 (3) MG/3ML IN SOLN
3.0000 mL | Freq: Once | RESPIRATORY_TRACT | Status: AC
  Administered 2014-02-03: 3 mL via RESPIRATORY_TRACT
  Filled 2014-02-03: qty 3

## 2014-02-03 MED ORDER — PREDNISONE 20 MG PO TABS
60.0000 mg | ORAL_TABLET | Freq: Once | ORAL | Status: AC
  Administered 2014-02-03: 60 mg via ORAL
  Filled 2014-02-03: qty 3

## 2014-02-03 NOTE — ED Notes (Signed)
Pt states she feels better after receiving breathing tx.

## 2014-02-03 NOTE — ED Notes (Addendum)
Pt c/o seasonal allergies and asthma exacerbation.  Pt reports "it started out as allergies, but now, I think , my asthma is acting up."  Pt has been using inhaler w/o relief.  NAD noted.  Pt speaking in full sentences.  Hx of asthma.

## 2014-02-03 NOTE — Discharge Instructions (Signed)
Asthma Attack Prevention Although there is no way to prevent asthma from starting, you can take steps to control the disease and reduce its symptoms. Learn about your asthma and how to control it. Take an active role to control your asthma by working with your health care provider to create and follow an asthma action plan. An asthma action plan guides you in:  Taking your medicines properly.  Avoiding things that set off your asthma or make your asthma worse (asthma triggers).  Tracking your level of asthma control.  Responding to worsening asthma.  Seeking emergency care when needed. To track your asthma, keep records of your symptoms, check your peak flow number using a handheld device that shows how well air moves out of your lungs (peak flow meter), and get regular asthma checkups.  WHAT ARE SOME WAYS TO PREVENT AN ASTHMA ATTACK?  Take medicines as directed by your health care provider.  Keep track of your asthma symptoms and level of control.  With your health care provider, write a detailed plan for taking medicines and managing an asthma attack. Then be sure to follow your action plan. Asthma is an ongoing condition that needs regular monitoring and treatment.  Identify and avoid asthma triggers. Many outdoor allergens and irritants (such as pollen, mold, cold air, and air pollution) can trigger asthma attacks. Find out what your asthma triggers are and take steps to avoid them.  Monitor your breathing. Learn to recognize warning signs of an attack, such as coughing, wheezing, or shortness of breath. Your lung function may decrease before you notice any signs or symptoms, so regularly measure and record your peak airflow with a home peak flow meter.  Identify and treat attacks early. If you act quickly, you are less likely to have a severe attack. You will also need less medicine to control your symptoms. When your peak flow measurements decrease and alert you to an upcoming  attack, take your medicine as instructed and immediately stop any activity that may have triggered the attack. If your symptoms do not improve, get medical help.  Pay attention to increasing quick-relief inhaler use. If you find yourself relying on your quick-relief inhaler, your asthma is not under control. See your health care provider about adjusting your treatment. WHAT CAN MAKE MY SYMPTOMS WORSE? A number of common things can set off or make your asthma symptoms worse and cause temporary increased inflammation of your airways. Keep track of your asthma symptoms for several weeks, detailing all the environmental and emotional factors that are linked with your asthma. When you have an asthma attack, go back to your asthma diary to see which factor, or combination of factors, might have contributed to it. Once you know what these factors are, you can take steps to control many of them. If you have allergies and asthma, it is important to take asthma prevention steps at home. Minimizing contact with the substance to which you are allergic will help prevent an asthma attack. Some triggers and ways to avoid these triggers are: Animal Dander:  Some people are allergic to the flakes of skin or dried saliva from animals with fur or feathers.   There is no such thing as a hypoallergenic dog or cat breed. All dogs or cats can cause allergies, even if they don't shed.  Keep these pets out of your home.  If you are not able to keep a pet outdoors, keep the pet out of your bedroom and other sleeping areas at  times, and keep the door closed.  Remove carpets and furniture covered with cloth from your home. If that is not possible, keep the pet away from fabric-covered furniture and carpets. Dust Mites: Many people with asthma are allergic to dust mites. Dust mites are tiny bugs that are found in every home in mattresses, pillows, carpets, fabric-covered furniture, bedcovers, clothes, stuffed toys, and other  fabric-covered items.   Cover your mattress in a special dust-proof cover.  Cover your pillow in a special dust-proof cover, or wash the pillow each week in hot water. Water must be hotter than 130 F (54.4 C) to kill dust mites. Cold or warm water used with detergent and bleach can also be effective.  Wash the sheets and blankets on your bed each week in hot water.  Try not to sleep or lie on cloth-covered cushions.  Call ahead when traveling and ask for a smoke-free hotel room. Bring your own bedding and pillows in case the hotel only supplies feather pillows and down comforters, which may contain dust mites and cause asthma symptoms.  Remove carpets from your bedroom and those laid on concrete, if you can.  Keep stuffed toys out of the bed, or wash the toys weekly in hot water or cooler water with detergent and bleach. Cockroaches: Many people with asthma are allergic to the droppings and remains of cockroaches.   Keep food and garbage in closed containers. Never leave food out.  Use poison baits, traps, powders, gels, or paste (for example, boric acid).  If a spray is used to kill cockroaches, stay out of the room until the odor goes away. Indoor Mold:  Fix leaky faucets, pipes, or other sources of water that have mold around them.  Clean floors and moldy surfaces with a fungicide or diluted bleach.  Avoid using humidifiers, vaporizers, or swamp coolers. These can spread molds through the air. Pollen and Outdoor Mold:  When pollen or mold spore counts are high, try to keep your windows closed.  Stay indoors with windows closed from late morning to afternoon. Pollen and some mold spore counts are highest at that time.  Ask your health care provider whether you need to take anti-inflammatory medicine or increase your dose of the medicine before your allergy season starts. Other Irritants to Avoid:  Tobacco smoke is an irritant. If you smoke, ask your health care provider how  you can quit. Ask family members to quit smoking too. Do not allow smoking in your home or car.  If possible, do not use a wood-burning stove, kerosene heater, or fireplace. Minimize exposure to all sources of smoke, including to incense, candles, fires, and fireworks.  Try to stay away from strong odors and sprays, such as perfume, talcum powder, hair spray, and paints.  Decrease humidity in your home and use an indoor air cleaning device. Reduce indoor humidity to below 60%. Dehumidifiers or central air conditioners can do this.  Decrease house dust exposure by changing furnace and air cooler filters frequently.  Try to have someone else vacuum for you once or twice a week. Stay out of rooms while they are being vacuumed and for a short while afterward.  If you vacuum, use a dust mask from a hardware store, a double-layered or microfilter vacuum cleaner bag, or a vacuum cleaner with a HEPA filter.  Sulfites in foods and beverages can be irritants. Do not drink beer or wine or eat dried fruit, processed potatoes, or shrimp if they cause asthma symptoms.  Cold air can trigger an asthma attack. Cover your nose and mouth with a scarf on cold or windy days.  Several health conditions can make asthma more difficult to manage, including a runny nose, sinus infections, reflux disease, psychological stress, and sleep apnea. Work with your health care provider to manage these conditions.  Avoid close contact with people who have a respiratory infection such as a cold or the flu, since your asthma symptoms may get worse if you catch the infection. Wash your hands thoroughly after touching items that may have been handled by people with a respiratory infection.  Get a flu shot every year to protect against the flu virus, which often makes asthma worse for days or weeks. Also get a pneumonia shot if you have not previously had one. Unlike the flu shot, the pneumonia shot does not need to be given  yearly. Medicines:  Talk to your health care provider about whether it is safe for you to take aspirin or non-steroidal anti-inflammatory medicines (NSAIDs). In a small number of people with asthma, aspirin and NSAIDs can cause asthma attacks. These medicines must be avoided by people who have known aspirin-sensitive asthma. It is important that people with aspirin-sensitive asthma read labels of all over-the-counter medicines used to treat pain, colds, coughs, and fever.  Beta blockers and ACE inhibitors are other medicines you should discuss with your health care provider. HOW CAN I FIND OUT WHAT I AM ALLERGIC TO? Ask your asthma health care provider about allergy skin testing or blood testing (the RAST test) to identify the allergens to which you are sensitive. If you are found to have allergies, the most important thing to do is to try to avoid exposure to any allergens that you are sensitive to as much as possible. Other treatments for allergies, such as medicines and allergy shots (immunotherapy) are available.  CAN I EXERCISE? Follow your health care provider's advice regarding asthma treatment before exercising. It is important to maintain a regular exercise program, but vigorous exercise, or exercise in cold, humid, or dry environments can cause asthma attacks, especially for those people who have exercise-induced asthma. Document Released: 10/02/2009 Document Revised: 06/16/2013 Document Reviewed: 04/21/2013 [facility] Patient Information 2014 [place].   Allergies  Allergies may happen from anything your body is sensitive to. This may be food, medicines, pollens, chemicals, and many other things. Food allergies can be severe and deadly.  HOME CARE  If you do not know what causes a reaction, keep a diary. Write down the foods you ate and the symptoms that followed. Avoid foods that cause reactions.  If you have red raised spots (hives) or a rash:  Take medicine as told by your  doctor.  Use medicines for red raised spots and itching as needed.  Apply cold cloths (compresses) to the skin. Take a cool bath. Avoid hot baths or showers.  If you are severely allergic:  It is often necessary to go to the hospital after you have treated your reaction.  Wear your medical alert jewelry.  You and your family must learn how to give a allergy shot or use an allergy kit (anaphylaxis kit).  Always carry your allergy kit or shot with you. Use this medicine as told by your doctor if a severe reaction is occurring. GET HELP RIGHT AWAY IF:  You have trouble breathing or are making high-pitched whistling sounds (wheezing).  You have a tight feeling in your chest or throat.  You have a puffy (swollen) mouth.  You have red raised spots, puffiness (swelling), or itching all over your body.  You have had a severe reaction that was helped by your allergy kit or shot. The reaction can return once the medicine has worn off.  You think you are having a food allergy. Symptoms most often happen within 30 minutes of eating a food.  Your symptoms have not gone away within 2 days or are getting worse.  You have new symptoms.  You want to retest yourself with a food or drink you think causes an allergic reaction. Only do this under the care of a doctor. MAKE SURE YOU:   Understand these instructions.  Will watch your condition.  Will get help right away if you are not doing well or get worse. Document Released: 02/08/2013 Document Reviewed: 02/08/2013 [facility] Patient Information 2014 [place], [place].

## 2014-02-03 NOTE — ED Provider Notes (Signed)
CSN: [id number]     Arrival date & time 02/03/14  1130 History   First MD Initiated Contact with Patient 02/03/14 1207     Chief Complaint  Patient presents with  . Allergies  . Asthma     (Consider location/radiation/quality/duration/timing/severity/associated sxs/prior Treatment) Patient is a 48 y.o. female presenting with asthma. The history is provided by the patient. No language interpreter was used.  Asthma This is a new problem. The current episode started more than 2 days ago. The problem occurs constantly. The problem has been gradually worsening. Associated symptoms include chest pain and shortness of breath. Pertinent negatives include no abdominal pain and no headaches. The symptoms are aggravated by exertion. The symptoms are relieved by rest. Treatments tried: albuterol. The treatment provided no relief.    Past Medical History  Diagnosis Date  . Diabetes mellitus   . Hypertension   . Asthma   . GERD (gastroesophageal reflux disease)   . Migraines   . Hyperlipidemia    Past Surgical History  Procedure Laterality Date  . Btl    . Tubal ligation     Family History  Problem Relation Age of Onset  . Diabetes Mother   . Hypertension Mother    History  Substance Use Topics  . Smoking status: Current Every Day Smoker -- 0.25 packs/day    Types: Cigarettes  . Smokeless tobacco: Never Used  . Alcohol Use: No   OB History   Grav Para Term Preterm Abortions TAB SAB Ect Mult Living                 Review of Systems  Constitutional: Negative for fever, chills, diaphoresis, activity change, appetite change and fatigue.  HENT: Negative for congestion, facial swelling, rhinorrhea and sore throat.   Eyes: Negative for photophobia and discharge.  Respiratory: Positive for shortness of breath and wheezing. Negative for cough and chest tightness.   Cardiovascular: Positive for chest pain. Negative for palpitations and leg swelling.  Gastrointestinal: Negative for nausea,  vomiting, abdominal pain and diarrhea.  Endocrine: Negative for polydipsia and polyuria.  Genitourinary: Negative for dysuria, frequency, difficulty urinating and pelvic pain.  Musculoskeletal: Negative for arthralgias, back pain, neck pain and neck stiffness.  Skin: Negative for color change and wound.  Allergic/Immunologic: Negative for immunocompromised state.  Neurological: Negative for facial asymmetry, weakness, numbness and headaches.  Hematological: Does not bruise/bleed easily.  Psychiatric/Behavioral: Negative for confusion and agitation.      Allergies  Pneumococcal vaccines and Vicodin  Home Medications   Current Outpatient Rx  Name  Route  Sig  Dispense  Refill  . acyclovir (ZOVIRAX) 200 MG capsule   Oral   Take 200 mg by mouth 2 (two) times daily.         Marland Kitchen albuterol (PROVENTIL HFA;VENTOLIN HFA) 108 (90 BASE) MCG/ACT inhaler   Inhalation   Inhale 2 puffs into the lungs every 6 (six) hours as needed for wheezing.         Marland Kitchen amLODipine (NORVASC) 5 MG tablet   Oral   Take 1 tablet (5 mg total) by mouth daily.   30 tablet   1   . aspirin EC 81 MG tablet   Oral   Take 81 mg by mouth daily.         Marland Kitchen atorvastatin (LIPITOR) 20 MG tablet   Oral   Take 1 tablet (20 mg total) by mouth daily.   30 tablet   1   . ferrous sulfate 325 (65  FE) MG tablet   Oral   Take 325 mg by mouth 2 (two) times daily before a meal.         . Fluticasone-Salmeterol (ADVAIR) 250-50 MCG/DOSE AEPB   Inhalation   Inhale 1 puff into the lungs 2 (two) times daily.         Marland Kitchen lisinopril-hydrochlorothiazide (PRINZIDE,ZESTORETIC) 20-12.5 MG per tablet   Oral   Take 1 tablet by mouth daily.           . metFORMIN (GLUCOPHAGE) 500 MG tablet   Oral   Take 500 mg by mouth 2 (two) times daily with a meal.         . naproxen (NAPROSYN) 500 MG tablet   Oral   Take 1 tablet (500 mg total) by mouth 2 (two) times daily.   30 tablet   0   . predniSONE (DELTASONE) 20 MG  tablet      3 tabs po day one, then 2 tabs daily x 4 days   11 tablet   0    BP 144/81  Pulse 94  Temp(Src) 98.7 F (37.1 C) (Oral)  Resp 20  SpO2 94% Physical Exam  Constitutional: She is oriented to person, place, and time. She appears well-developed and well-nourished. No distress.  HENT:  Head: Normocephalic and atraumatic.  Mouth/Throat: No oropharyngeal exudate.  Eyes: Pupils are equal, round, and reactive to light.  Neck: Normal range of motion. Neck supple.  Cardiovascular: Normal rate, regular rhythm and normal heart sounds.  Exam reveals no gallop and no friction rub.   No murmur heard. Pulmonary/Chest: Effort normal. No respiratory distress. She has wheezes in the right middle field, the right lower field, the left middle field and the left lower field. She has no rales.  Abdominal: Soft. Bowel sounds are normal. She exhibits no distension and no mass. There is no tenderness. There is no rebound and no guarding.  Musculoskeletal: Normal range of motion. She exhibits no edema and no tenderness.  Neurological: She is alert and oriented to person, place, and time.  Skin: Skin is warm and dry.  Psychiatric: She has a normal mood and affect.    ED Course  Procedures (including critical care time) Labs Review Labs Reviewed - No data to display Imaging Review No results found.   EKG Interpretation   Date/Time:  Thursday February 03 2014 12:45:47 EDT Ventricular Rate:  82 PR Interval:  181 QRS Duration: 97 QT Interval:  383 QTC Calculation: 447 R Axis:   42 Text Interpretation:  Sinus rhythm Probable anteroseptal infarct, old  Baseline wander in lead(s) I V2 V3 V4 V5 V6 No significant change since  last tracing Confirmed by [place] [phone]) on 02/03/2014 12:49:14  PM      MDM   Final diagnoses:  Asthma exacerbation  Seasonal allergies    Pt is a 48 y.o. female with Pmhx as above who presents with about 1 week of cough, post-nasal gtt, nasal  congestion c/w pervious seasonal allergy symptoms, now also with inc wheezing, SOB and L lateral chest tightness worse w/ cough & deep breathing. On PE, VSS, pt in NAD. Wheezing heard in lower lung fields. No chest wall pain, no LE pain or edema. No hx of risk factors for PE (PERC negative). EKG NSR w/o acute ischemic changes. Pt feeling improved after duoneb & prednisone. Will d/c home w/ 5 days of prednisone for asthma exacerbation.  Return precautions given for new or worsening symptoms including worsening  SOB, CP, development of fever, leg swelling.          Neta Ehlers, MD 02/03/14 [phone]

## 2014-03-24 ENCOUNTER — Encounter: Payer: Self-pay | Admitting: Medical

## 2014-03-24 ENCOUNTER — Ambulatory Visit (INDEPENDENT_AMBULATORY_CARE_PROVIDER_SITE_OTHER): Payer: BC Managed Care – PPO | Admitting: Medical

## 2014-03-24 VITALS — BP 140/98 | HR 84 | Temp 98.0°F | Resp 18 | Ht 70.5 in | Wt 283.0 lb

## 2014-03-24 DIAGNOSIS — M79609 Pain in unspecified limb: Secondary | ICD-10-CM

## 2014-03-24 DIAGNOSIS — I10 Essential (primary) hypertension: Secondary | ICD-10-CM

## 2014-03-24 DIAGNOSIS — R609 Edema, unspecified: Secondary | ICD-10-CM

## 2014-03-24 DIAGNOSIS — M79671 Pain in right foot: Secondary | ICD-10-CM

## 2014-03-24 DIAGNOSIS — M79672 Pain in left foot: Secondary | ICD-10-CM

## 2014-03-24 DIAGNOSIS — E669 Obesity, unspecified: Secondary | ICD-10-CM

## 2014-03-24 NOTE — Progress Notes (Signed)
Subjective: New patient today. Was seeing [facility] prior  Having feet pain, leg swelling.  She works as a Quarry manager, does a lot of walking.  been having feet pain x 3 weeks, worse in heel, but along bottom of feet, bilat.  pain is not an issue in the morning, worse at in the day after she has been working all day  Gets swelling lower legs and feet.  No chest pain, no dyspnea.  Using Ibuprofen OTC for feet pain.   No compression hose.  Wears some rather thin "nursing shoes".  Last routine followup on diabetes was 1/15 January, Dr. Larey Days, [facility].  Last HgbA1C 6.1%.     Objective: Filed Vitals:   03/24/14 1519  BP: 140/98  Pulse: 84  Temp: 98 F (36.7 C)  Resp: 18    General appearance: alert, no distress, WD/WN, obese AA female Neck: supple, no lymphadenopathy, no thyromegaly, no masses Heart: RRR, normal S1, S2, no murmurs Lungs: CTA bilaterally, no wheezes, rhonchi, or rales Abdomen: +bs, soft, non tender, non distended, no masses, no hepatomegaly, no splenomegaly Pulses: 1+ pedal pulses Extremities mild 1+ ankle edema only, no cyanosis or clubbing no other swelling MSK: Mild tenderness of heels and soles of both feet otherwise nontender, no deformity, some mild callus formation of right fifth toe laterally no other skin lesions, normal range of motion feet and toes   Assessment: Encounter Diagnoses  Name Primary?  . Edema Yes  . Foot pain, bilateral   . Essential hypertension, benign   . Obesity, unspecified    Plan: We discussed her concerns, discussed causes of edema which is multifactorial in her case.  Edema likely due to being on her feet all day, dependent edema, too much salt and sugar intake, is on amlodipine and NSAID which can contribute to edema.Marland Kitchen advise she start cutting back on salt intake, eat a healthy diet, continue current medications, continue elevating the legs and even, continue exercise, consider over-the-counter compression hose, and change to  a more supportive footwear for work  Followup within a month for routine diabetes and blood pressure followup

## 2014-03-24 NOTE — Patient Instructions (Signed)
  Thank you for giving me the opportunity to serve you today.    Your diagnosis today includes: Encounter Diagnoses  Name Primary?  . Edema Yes  . Foot pain, bilateral   . Essential hypertension, benign      Specific recommendations today include:  Your symptoms today are multifactorial - caused by several things  Amlodipine BP medication can cause leg swelling  Ibuprofen can cause leg swelling  Salt intake can cause swelling  Too much sugar intake can cause swelling  Been on your feet all day can cause swelling  Treatment:  Try and cut back on salt intake/Sodium  But continue your current prescriptions medications  Consider either OTC compression hose, prescription compression hose, or diabetic socks daily  consider a more supportive shoe or diabetic shoe.  See if employer will even pay for these.  Elevate the legs in the evenings  Exercise regularly  Drink plenty of water daily

## 2014-04-12 ENCOUNTER — Telehealth: Payer: Self-pay | Admitting: Family Medicine

## 2014-04-12 ENCOUNTER — Emergency Department (HOSPITAL_COMMUNITY)
Admission: EM | Admit: 2014-04-12 | Discharge: 2014-04-12 | Disposition: A | Discharge status: Home / Self Care | Payer: BC Managed Care – PPO | Attending: Emergency Medicine | Admitting: Emergency Medicine

## 2014-04-12 ENCOUNTER — Encounter (HOSPITAL_COMMUNITY): Payer: Self-pay | Admitting: Emergency Medicine

## 2014-04-12 DIAGNOSIS — Z8719 Personal history of other diseases of the digestive system: Secondary | ICD-10-CM | POA: Insufficient documentation

## 2014-04-12 DIAGNOSIS — J45909 Unspecified asthma, uncomplicated: Secondary | ICD-10-CM | POA: Insufficient documentation

## 2014-04-12 DIAGNOSIS — E119 Type 2 diabetes mellitus without complications: Secondary | ICD-10-CM | POA: Insufficient documentation

## 2014-04-12 DIAGNOSIS — E785 Hyperlipidemia, unspecified: Secondary | ICD-10-CM | POA: Insufficient documentation

## 2014-04-12 DIAGNOSIS — F17200 Nicotine dependence, unspecified, uncomplicated: Secondary | ICD-10-CM | POA: Insufficient documentation

## 2014-04-12 DIAGNOSIS — Z79899 Other long term (current) drug therapy: Secondary | ICD-10-CM | POA: Insufficient documentation

## 2014-04-12 DIAGNOSIS — R51 Headache: Secondary | ICD-10-CM | POA: Insufficient documentation

## 2014-04-12 DIAGNOSIS — Z7982 Long term (current) use of aspirin: Secondary | ICD-10-CM | POA: Insufficient documentation

## 2014-04-12 DIAGNOSIS — R519 Headache, unspecified: Secondary | ICD-10-CM

## 2014-04-12 DIAGNOSIS — I10 Essential (primary) hypertension: Secondary | ICD-10-CM | POA: Insufficient documentation

## 2014-04-12 MED ORDER — DIPHENHYDRAMINE HCL 50 MG/ML IJ SOLN
50.0000 mg | Freq: Once | INTRAMUSCULAR | Status: AC
  Administered 2014-04-12: 50 mg via INTRAMUSCULAR
  Filled 2014-04-12: qty 1

## 2014-04-12 MED ORDER — METOCLOPRAMIDE HCL 5 MG/ML IJ SOLN: 10.0000 mg | Freq: Once | INTRAMUSCULAR | Status: DC

## 2014-04-12 MED ORDER — DIPHENHYDRAMINE HCL 50 MG/ML IJ SOLN: 25.0000 mg | Freq: Once | INTRAMUSCULAR | Status: DC

## 2014-04-12 MED ORDER — METOCLOPRAMIDE HCL 5 MG/ML IJ SOLN
10.0000 mg | Freq: Once | INTRAMUSCULAR | Status: AC
  Administered 2014-04-12: 10 mg via INTRAMUSCULAR
  Filled 2014-04-12: qty 2

## 2014-04-12 NOTE — Telephone Encounter (Signed)
ER letter sent 

## 2014-04-12 NOTE — ED Notes (Signed)
Pt. reports right facial pain with mild swelling onset yesterday , denies injury / no fever .

## 2014-04-12 NOTE — ED Provider Notes (Signed)
CSN: [id number]     Arrival date & time 04/12/14  5732 History   First MD Initiated Contact with Patient 04/12/14 [phone]     Chief Complaint  Patient presents with  . Facial Pain      Patient is a 48 y.o. female presenting with headaches. The history is provided by the patient.  Headache Pain location:  R temporal Quality:  Dull Radiates to:  Does not radiate Onset quality:  Gradual Duration:  1 day Timing:  Constant Progression:  Worsening Relieved by:  Nothing Worsened by:  Nothing tried Associated symptoms: no abdominal pain, no blurred vision, no pain, no fever, no focal weakness, no hearing loss, no neck stiffness, no vomiting and no weakness   pt reports onset of right facial pain/temporal starting yesterday Gradual in onset, no trauma No fever No visual loss No hearing loss No dental pain She reports h/o headaches but this is unilateral, usually HA are diffuse   Past Medical History  Diagnosis Date  . Diabetes mellitus   . Hypertension   . Asthma   . GERD (gastroesophageal reflux disease)   . Migraines   . Hyperlipidemia    Past Surgical History  Procedure Laterality Date  . Btl    . Tubal ligation     Family History  Problem Relation Age of Onset  . Diabetes Mother   . Hypertension Mother    History  Substance Use Topics  . Smoking status: Current Every Day Smoker -- 0.25 packs/day    Types: Cigarettes  . Smokeless tobacco: Never Used  . Alcohol Use: No   OB History   Grav Para Term Preterm Abortions TAB SAB Ect Mult Living                 Review of Systems  Constitutional: Negative for fever.  HENT: Negative for hearing loss.   Eyes: Negative for blurred vision and pain.  Gastrointestinal: Negative for vomiting and abdominal pain.  Musculoskeletal: Negative for neck stiffness.  Neurological: Positive for headaches. Negative for focal weakness.  All other systems reviewed and are negative.     Allergies  Pneumococcal vaccines and  Vicodin  Home Medications   Prior to Admission medications   Medication Sig Start Date End Date Taking? Authorizing Provider  acyclovir (ZOVIRAX) 200 MG capsule Take 200 mg by mouth 2 (two) times daily.   Yes Historical Provider, MD  albuterol (PROVENTIL HFA;VENTOLIN HFA) 108 (90 BASE) MCG/ACT inhaler Inhale 2 puffs into the lungs every 6 (six) hours as needed for wheezing.   Yes Historical Provider, MD  amLODipine (NORVASC) 5 MG tablet Take 1 tablet (5 mg total) by mouth daily. 07/04/12  Yes Reyne Dumas, MD  aspirin EC 81 MG tablet Take 81 mg by mouth daily.   Yes Historical Provider, MD  atorvastatin (LIPITOR) 20 MG tablet Take 1 tablet (20 mg total) by mouth daily. 07/04/12  Yes Reyne Dumas, MD  Fluticasone-Salmeterol (ADVAIR) 250-50 MCG/DOSE AEPB Inhale 1 puff into the lungs 2 (two) times daily.   Yes Historical Provider, MD  lisinopril-hydrochlorothiazide (PRINZIDE,ZESTORETIC) 20-12.5 MG per tablet Take 1 tablet by mouth daily.     Yes Historical Provider, MD  metFORMIN (GLUCOPHAGE) 500 MG tablet Take 500 mg by mouth 2 (two) times daily with a meal.   Yes Historical Provider, MD   BP 143/79  Pulse 88  Temp(Src) 98.4 F (36.9 C) (Oral)  Resp 22  Ht 5\' 11"  (1.803 m)  Wt 283 lb (128.368 kg)  BMI 39.49  kg/m2  SpO2 97%  LMP 03/08/2014 Physical Exam CONSTITUTIONAL: Well developed/well nourished HEAD: Normocephalic/atraumatic EYES: EOMI/PERRL, no nystagmus, no ptosis, ENMT: Mucous membranes moist, no facial swelling, no dental tenderness, no trismus Left TM/right TM clear/intact NECK: supple no meningeal signs, no bruits SPINE:entire spine nontender CV: S1/S2 noted, no murmurs/rubs/gallops noted LUNGS: Lungs are clear to auscultation bilaterally, no apparent distress ABDOMEN: soft, nontender, no rebound or guarding GU:no cva tenderness NEURO:Awake/alert, facies symmetric, no arm or leg drift is noted Equal 5/5 strength with shoulder abduction, elbow flex/extension, wrist  flex/extension in upper extremities and equal hand grips bilaterally Equal 5/5 strength with hip flexion,knee flex/extension, foot dorsi/plantar flexion Cranial nerves 3/4/5/6/05/05/09/11/12 tested and intact Gait normal without ataxia No past pointing Sensation to light touch intact in all extremities EXTREMITIES: pulses normal, full ROM SKIN: warm, color normal PSYCH: no abnormalities of mood noted   ED Course  Procedures  Pt well appearing, no facial swelling, no neuro deficits, no distress noted, I doubt SAH or other acute neurologic event at this time  Stable for d/c home MDM   Final diagnoses:  Headache    Nursing notes including past medical history and social history reviewed and considered in documentation     Sharyon Cable, MD 04/12/14 [phone]

## 2014-04-12 NOTE — Discharge Instructions (Signed)

## 2014-05-05 ENCOUNTER — Ambulatory Visit (INDEPENDENT_AMBULATORY_CARE_PROVIDER_SITE_OTHER): Payer: BC Managed Care – PPO | Admitting: Medical

## 2014-05-05 ENCOUNTER — Encounter: Payer: Self-pay | Admitting: Medical

## 2014-05-05 VITALS — BP 134/86 | HR 100 | Temp 98.0°F | Resp 20 | Wt 282.0 lb

## 2014-05-05 DIAGNOSIS — M79671 Pain in right foot: Secondary | ICD-10-CM

## 2014-05-05 DIAGNOSIS — I10 Essential (primary) hypertension: Secondary | ICD-10-CM

## 2014-05-05 DIAGNOSIS — L84 Corns and callosities: Secondary | ICD-10-CM

## 2014-05-05 DIAGNOSIS — R609 Edema, unspecified: Secondary | ICD-10-CM

## 2014-05-05 DIAGNOSIS — M79609 Pain in unspecified limb: Secondary | ICD-10-CM

## 2014-05-05 DIAGNOSIS — M79672 Pain in left foot: Secondary | ICD-10-CM

## 2014-05-05 DIAGNOSIS — R7301 Impaired fasting glucose: Secondary | ICD-10-CM

## 2014-05-05 LAB — POCT GLYCOSYLATED HEMOGLOBIN (HGB A1C): Hemoglobin A1C: 6.2

## 2014-05-05 MED ORDER — ATORVASTATIN CALCIUM 20 MG PO TABS: 20.0000 mg | ORAL_TABLET | Freq: Every day | ORAL | Status: DC

## 2014-05-05 MED ORDER — ALBUTEROL SULFATE HFA 108 (90 BASE) MCG/ACT IN AERS: 2.0000 | INHALATION_SPRAY | Freq: Four times a day (QID) | RESPIRATORY_TRACT | Status: DC | PRN

## 2014-05-05 MED ORDER — AMLODIPINE BESYLATE 5 MG PO TABS: 5.0000 mg | ORAL_TABLET | Freq: Every day | ORAL | Status: DC

## 2014-05-05 MED ORDER — FLUTICASONE-SALMETEROL 250-50 MCG/DOSE IN AEPB: 1.0000 | INHALATION_SPRAY | Freq: Two times a day (BID) | RESPIRATORY_TRACT | Status: DC

## 2014-05-05 MED ORDER — LISINOPRIL-HYDROCHLOROTHIAZIDE 20-25 MG PO TABS: 1.0000 | ORAL_TABLET | Freq: Every day | ORAL | Status: DC

## 2014-05-05 MED ORDER — ASPIRIN EC 81 MG PO TBEC: 81.0000 mg | DELAYED_RELEASE_TABLET | Freq: Every day | ORAL | Status: DC

## 2014-05-05 MED ORDER — METFORMIN HCL 500 MG PO TABS: 500.0000 mg | ORAL_TABLET | Freq: Every day | ORAL | Status: DC

## 2014-05-05 NOTE — Progress Notes (Signed)
Subjective:    Patient ID: Sydney Williams, female    DOB: 01/29/1966, 48 y.o.   MRN: [id number]  HPI  Patient is presenting for 1 month follow up on hypertension, bilateral foot edema and obesity.   HTN - is on lis-HCTZ, amlodipine. Eats "regular diet", tries to eat salads and vegetables, she snacks all day long eating fruits, has lowered her salt intake, is avoiding junk food like potato chips and Now[employer]Later candy. She is cutting back on sodas and is drinking more water. 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 Does not actively exercise outside of work since it can be pretty strenuous.   Diabetes - patient has been taking Metformin 500mg  QD prescribed by [facility] for the past 15 years. Reports that her last A1C was 6.1% in January with [facility]. Diet and exercise as above. Notes numbness and tingling in her left hand, arm every day, occurs randomly. Patient "shakes out her hands" and numbness resolves.  Also feels intermittent numbness and tingling in her toes bilaterally everyday. Reports some dizziness, ~3x a week, when she stands but no falls. No polyphagia, no polydipsia, no polyuria, no changes in her vision.  Heel pain/foot edema - ongoing since last visit here, gets worse throughout the day. Patient bought [place] spirit to help with heel pain and it did not help. She has tried nursing shoes which did not work for her either. She wore compressive stockings which the patient states made her swelling worse. She also tried 800mg  Ibuprofen did not help which she takes every day. She continues to work and spends all day on her feet. By the end of the day, her feet are swollen and extends to just below the knee.  No other aggravating or relieving factors. Denies any other questions or concerns.  Review of Systems As in subjective.    Objective:   Physical Exam  BP 134/86  Pulse 100  Temp(Src) 98 F (36.7 C) (Oral)   Resp 20  Wt 282 lb (127.914 kg)  Body mass index is 39.35 kg/(m^2).  BP Readings from Last 3 Encounters:  05/05/14 134/86  04/12/14 124/75  03/24/14 140/98   Wt Readings from Last 3 Encounters:  05/05/14 282 lb (127.914 kg)  04/12/14 283 lb (128.368 kg)  03/24/14 283 lb (128.368 kg)     Filed Vitals:   05/05/14 1423  BP: 134/86  Pulse: 100  Temp: 98 F (36.7 C)  Resp: 20    General appearance: alert, no distress, WD/WN, obese borderline morbidly obese AA female   Neck: supple, nontender, no thyromegaly, no lymphadenopathy Heart: RRR, normal S1, S2, no murmurs  Lungs: CTA bilaterally, no wheezes, rhonchi, or rales  Pulses: 1+ dorsalis pedis pulses  Extremities mild 1+ ankle edema only, no cyanosis or clubbing no other swelling  MSK: left medial volar foot with tender dense tissue suggestive of corn, mild tenderness of heels and soles of both feet otherwise nontender, no other deformity, no other skin lesions, normal range of motion feet and toes      Assessment & Plan:   Encounter Diagnoses  Name Primary?  . Edema Yes  . Impaired fasting blood sugar   . Essential hypertension, benign   . Corn of foot   . Foot pain, bilateral    We discussed her concerns today.  We will once again request records from [place] given she  had labs recently  Specific recommendations today include:  Begin Lisinopril HCT 20/25mg  daily.  This is increased from prior.  Begin OTC corn remover topically to the left foot corn  Continue rest of medications as usual  Eat healthy, limit salt, exercise  Do daily tennis ball massage and foot stretch before getting out of the bed daily  Call us back in 3-4 weeks to let me know about swelling, how your blood pressure is looking and regarding the foot issues

## 2014-05-05 NOTE — Patient Instructions (Signed)
  Thank you for giving me the opportunity to serve you today.    Your diagnosis today includes: Encounter Diagnosis  Name Primary?  . Impaired fasting blood sugar Yes     Specific recommendations today include:  Begin Lisinopril HCT 20/25mg  daily.  This is increased from prior.  Begin OTC corn remover topically to the left foot corn  Continue rest of medications as usual  Eat healthy, limit salt, exercise  Do daily tennis ball massage and foot stretch before getting out of the bed daily  Call us back in 3-4 weeks to let me know about swelling, how your blood pressure is looking and regarding the foot issues  Return call report 3-4 wk.

## 2014-06-10 ENCOUNTER — Encounter: Payer: Self-pay | Admitting: Medical

## 2014-07-01 ENCOUNTER — Ambulatory Visit (INDEPENDENT_AMBULATORY_CARE_PROVIDER_SITE_OTHER): Payer: BC Managed Care – PPO | Admitting: Medical

## 2014-07-01 ENCOUNTER — Encounter: Payer: Self-pay | Admitting: Medical

## 2014-07-01 VITALS — BP 112/70 | HR 105 | Temp 98.5°F | Resp 20 | Wt 272.0 lb

## 2014-07-01 DIAGNOSIS — J45901 Unspecified asthma with (acute) exacerbation: Secondary | ICD-10-CM

## 2014-07-01 DIAGNOSIS — J4541 Moderate persistent asthma with (acute) exacerbation: Secondary | ICD-10-CM

## 2014-07-01 DIAGNOSIS — J0100 Acute maxillary sinusitis, unspecified: Secondary | ICD-10-CM

## 2014-07-01 MED ORDER — AMOXICILLIN 875 MG PO TABS: 875.0000 mg | ORAL_TABLET | Freq: Two times a day (BID) | ORAL | Status: DC

## 2014-07-01 MED ORDER — ALBUTEROL SULFATE (2.5 MG/3ML) 0.083% IN NEBU: 2.5000 mg | INHALATION_SOLUTION | Freq: Four times a day (QID) | RESPIRATORY_TRACT | Status: DC | PRN

## 2014-07-01 MED ORDER — IPRATROPIUM-ALBUTEROL 0.5-2.5 (3) MG/3ML IN SOLN: 3.0000 mL | Freq: Once | RESPIRATORY_TRACT | Status: DC

## 2014-07-01 MED ORDER — OXYCODONE-ACETAMINOPHEN 2.5-325 MG PO TABS: 1.0000 | ORAL_TABLET | Freq: Four times a day (QID) | ORAL | Status: DC | PRN

## 2014-07-01 MED ORDER — METHYLPREDNISOLONE SODIUM SUCC 125 MG IJ SOLR
125.0000 mg | Freq: Once | INTRAMUSCULAR | Status: AC
  Administered 2014-07-01: 125 mg via INTRAMUSCULAR

## 2014-07-01 MED ORDER — METHYLPREDNISOLONE (PAK) 4 MG PO TABS: ORAL_TABLET | ORAL | Status: DC

## 2014-07-01 NOTE — Progress Notes (Signed)
Subjective:    Sydney Williams is a 48 y.o. female who presents for evaluation of shortness of breath/asthma flare. Dyspnea has been severe, and generally lasting all day the last few days.she feels like this asthma flare was triggered when some people at work were using a strong cleanser this week.  She works at a [employer].  Associated symptoms include: chest tightness, cough, wheezing, SOB, nasal congestion, green nasal secretions, sore throat. The symptoms are  relieved by nothing.  Using Adviair as usual BID, albuterol throughout the day HFA without improvement.  Last flare up was with similar trigger in January.  Usually well controlled.  She is diabetic and glucose has been running good, even under 100 fasting mornings.   She notes the last time she went to the ED for same did well with prednisone does pak, albuterol nebs, antibiotic and percocet for the chest wall pain from coughing so much.  Patient's cardiac risk factors are: diabetes mellitus, hypertension and obesity (BMI >= 30 kg/m2). Patient's risk factors for DVT/PE: none.   The following portions of the patient's history were reviewed and updated as appropriate: allergies, current medications, past family history, past medical history, past social history, past surgical history and problem list.  Review of Systems Constitutional: denies fever, +chills, sweats, unexpected weight change, anorexia, +fatigue ENT: +runny nose,+ ear pain, +sore throat, hoarseness, +sinus pain, hearing loss, epistaxis Cardiology: denies palpitations, edema, orthopnea, paroxysmal nocturnal dyspnea Gastroenterology: denies abdominal pain, +nausea, no vomiting, diarrhea, constipation,  Hematology: denies bleeding or bruising problems Musculoskeletal: denies arthralgias, myalgias, joint swelling, back pain, neck pain, cramping, gait changes Ophthalmology: denies vision changes Urology: denies dysuria, difficulty urinating, hematuria, urinary  frequency, urgency, incontinence Neurology: no headache, weakness, tingling, numbness, speech abnormality, memory loss, falls, dizziness Psychology: denies depressed mood, agitation, sleep problems   Past Medical History  Diagnosis Date  . Hypertension   . Asthma   . GERD (gastroesophageal reflux disease)   . Migraines   . Hyperlipidemia   . Diabetes mellitus     vs impaired fasting glucose    Objective:      Filed Vitals:   07/01/14 1055  BP: 112/70  Pulse: 105  Temp: 98.5 F (36.9 C)  Resp: 20    General appearance: alert, WD/WN, female wheezing and tachypnea HEENT: normocephalic, conjunctiva/corneas normal, sclerae anicteric, PERRLA, EOMi, nares with turbinated edema, mucoid discharge, mild erythema, pharynx with post nasal drainage Oral cavity: MMM, tongue normal, teeth normal Neck: supple, no lymphadenopathy, no thyromegaly, no JVD, no masses, normal ROM Chest: tender chest wall lower bilat, normal shape and expansion Heart: RRR, normal S1, S2, no murmurs Lungs: diffuse wheezes,no rhonchi, or rales Abdomen: +bs, soft, non tender, non distended, no masses, no hepatomegaly, no splenomegaly, no bruits Extremities: no edema, no cyanosis, no clubbing Pulses: 2+ symmetric, upper and lower extremities, normal cap refill Neurological: alert, oriented x 3, CN2-12 intact Psychiatric: normal affect, behavior normal, pleasant     Assessment:     Encounter Diagnoses  Name Primary?  Marland Kitchen Asthma with acute exacerbation, moderate persistent Yes  . Acute maxillary sinusitis, recurrence not specified     Plan   upon presentation and after brief history and exam started duoneb treatment. This provided some relief.  After additional history exam, and gave one 125 mg Solu-Medrol IM.  Started a second round of albuterol nebulizer.  Some improvement while in office.  She remained around 96% room air at rest.  Home recommendations: Advise she continue Advair twice daily,  continue  albuterol HFA or nebulizer as needed every 4-6 hours, begin amoxicillin, and if not seeing much improvement within 48 hours, then begin Medrol Dosepak.  Short term percocet for chest wall discomfort and cough.  Rest, hydrate well, avoid strong chemicals/odors.  Avoid strenuous activity the next several days.  monitor glucose carefully as the steroid will likely increase glucose.  If pre-meal numbers over 250-300, then let us know.  Dispensed nebulizer equipment for home use.    As it is currently Friday, If not improving at all then report to the emergency department.  She understands and agrees with plan

## 2014-07-01 NOTE — Patient Instructions (Signed)
  Thank you for giving me the opportunity to serve you today.    Your diagnosis today includes: Encounter Diagnoses  Name Primary?  Marland Kitchen Asthma with acute exacerbation, moderate persistent Yes  . Acute maxillary sinusitis, recurrence not specified      Specific recommendations today include:  Continue Advair  1 inhalation twice daily for prevention/maintenance  Use EITHER Albuterol handheld inhaler or the nebulized Albuterol every 4-6 hours as needed the next several days for wheezing and shortness of breath  The 125mg  SoluMedrol steroid should really start to help things improve  Begin Amoxicilllin antibiotic twice daily for 10 days for sinus infection  Rest, drink plenty of water, no strenuous activity the next several days  If you don't feel much better in regards to wheezing in the next 48 hours, you may begin Medrol steroid dosepak oral  If not improving or can't breath - go to the emergency department  Check glucose more frequently this weekend, and if glucose over 250-300 pre meals, let us know/call after hours line  Return if symptoms worsen or fail to improve.

## 2014-07-25 ENCOUNTER — Encounter: Payer: Self-pay | Admitting: Medical

## 2014-08-10 ENCOUNTER — Other Ambulatory Visit: Payer: Self-pay | Admitting: Medical

## 2014-09-30 ENCOUNTER — Other Ambulatory Visit: Payer: Self-pay | Admitting: Family Medicine

## 2014-09-30 ENCOUNTER — Telehealth: Payer: Self-pay | Admitting: Internal Medicine

## 2014-09-30 MED ORDER — ATORVASTATIN CALCIUM 20 MG PO TABS: 20.0000 mg | ORAL_TABLET | Freq: Every day | ORAL | Status: DC

## 2014-09-30 NOTE — Telephone Encounter (Signed)
Refill request for lipitor 20mg  to [facility] [address]

## 2014-09-30 NOTE — Telephone Encounter (Signed)
Medication refill for Lipitor was sent to [facility] on [address]

## 2014-10-21 ENCOUNTER — Emergency Department (HOSPITAL_COMMUNITY)
Admission: EM | Admit: 2014-10-21 | Discharge: 2014-10-21 | Disposition: A | Discharge status: Home / Self Care | Payer: BC Managed Care – PPO | Attending: Emergency Medicine | Admitting: Emergency Medicine

## 2014-10-21 ENCOUNTER — Encounter (HOSPITAL_COMMUNITY): Payer: Self-pay | Admitting: Emergency Medicine

## 2014-10-21 DIAGNOSIS — J45909 Unspecified asthma, uncomplicated: Secondary | ICD-10-CM | POA: Diagnosis not present

## 2014-10-21 DIAGNOSIS — M545 Low back pain, unspecified: Secondary | ICD-10-CM

## 2014-10-21 DIAGNOSIS — Z7951 Long term (current) use of inhaled steroids: Secondary | ICD-10-CM | POA: Insufficient documentation

## 2014-10-21 DIAGNOSIS — Z79899 Other long term (current) drug therapy: Secondary | ICD-10-CM | POA: Insufficient documentation

## 2014-10-21 DIAGNOSIS — Z8719 Personal history of other diseases of the digestive system: Secondary | ICD-10-CM | POA: Diagnosis not present

## 2014-10-21 DIAGNOSIS — Y990 Civilian activity done for income or pay: Secondary | ICD-10-CM | POA: Insufficient documentation

## 2014-10-21 DIAGNOSIS — Y9289 Other specified places as the place of occurrence of the external cause: Secondary | ICD-10-CM | POA: Insufficient documentation

## 2014-10-21 DIAGNOSIS — Z7982 Long term (current) use of aspirin: Secondary | ICD-10-CM | POA: Insufficient documentation

## 2014-10-21 DIAGNOSIS — Z720 Tobacco use: Secondary | ICD-10-CM | POA: Diagnosis not present

## 2014-10-21 DIAGNOSIS — W1839XA Other fall on same level, initial encounter: Secondary | ICD-10-CM | POA: Diagnosis not present

## 2014-10-21 DIAGNOSIS — I10 Essential (primary) hypertension: Secondary | ICD-10-CM | POA: Diagnosis not present

## 2014-10-21 DIAGNOSIS — Y9389 Activity, other specified: Secondary | ICD-10-CM | POA: Insufficient documentation

## 2014-10-21 DIAGNOSIS — S3992XA Unspecified injury of lower back, initial encounter: Secondary | ICD-10-CM | POA: Insufficient documentation

## 2014-10-21 DIAGNOSIS — E785 Hyperlipidemia, unspecified: Secondary | ICD-10-CM | POA: Insufficient documentation

## 2014-10-21 MED ORDER — OXYCODONE-ACETAMINOPHEN 5-325 MG PO TABS: 1.0000 | ORAL_TABLET | Freq: Four times a day (QID) | ORAL | Status: DC | PRN

## 2014-10-21 NOTE — ED Provider Notes (Signed)
CSN: [id number]     Arrival date & time 10/21/14  1437 History   First MD Initiated Contact with Patient 10/21/14 1505     Chief Complaint  Patient presents with  . Back Pain     (Consider location/radiation/quality/duration/timing/severity/associated sxs/prior Treatment) HPI  Sydney Williams is a 48 y.o. 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 Patient states pain is a sharp stabbing sensation that radiates into her right groin. In her groin and fills like she "pulled a muscle." She has not taken anything for the pain. She denies history of back pain. Patient states walking makes it worse. Patient denies abdominal pain or urinary complaints. No fevers, chills, night sweats, weight loss, IVDU, history of malignancy. No loss of control of bladder or bowel. No numbness/tingling, weakness or saddle anesthesia.     Past Medical History  Diagnosis Date  . Hypertension   . Asthma   . GERD (gastroesophageal reflux disease)   . Migraines   . Hyperlipidemia   . Diabetes mellitus     vs impaired fasting glucose   Past Surgical History  Procedure Laterality Date  . Btl    . Tubal ligation     Family History  Problem Relation Age of Onset  . Diabetes Mother   . Hypertension Mother    History  Substance Use Topics  . Smoking status: Current Every Day Smoker -- 0.25 packs/day    Types: Cigarettes  . Smokeless tobacco: Never Used  . Alcohol Use: No   OB History    No data available     Review of Systems  Constitutional: Negative for fever and chills.  Cardiovascular: Negative for chest pain and palpitations.  Gastrointestinal: Negative for nausea, vomiting and diarrhea.  Genitourinary: Negative for dysuria and hematuria.  Musculoskeletal: Positive for back pain. Negative for gait problem.  Skin: Negative for rash.  Neurological: Negative for weakness and numbness.  All other systems  reviewed and are negative.     Allergies  Pneumococcal vaccines and Vicodin  Home Medications   Prior to Admission medications   Medication Sig Start Date End Date Taking? Authorizing Provider  acyclovir (ZOVIRAX) 200 MG capsule Take 200 mg by mouth 2 (two) times daily.   Yes Historical Provider, MD  albuterol (PROVENTIL HFA;VENTOLIN HFA) 108 (90 BASE) MCG/ACT inhaler Inhale 2 puffs into the lungs every 6 (six) hours as needed for wheezing. 05/05/14  Yes Camelia Eng Tysinger, PA-C  albuterol (PROVENTIL) (2.5 MG/3ML) 0.083% nebulizer solution Take 3 mLs (2.5 mg total) by nebulization every 6 (six) hours as needed for wheezing or shortness of breath. 07/01/14  Yes Camelia Eng Tysinger, PA-C  amLODipine (NORVASC) 5 MG tablet Take 1 tablet (5 mg total) by mouth daily. 05/05/14  Yes Camelia Eng Tysinger, PA-C  aspirin EC 81 MG tablet Take 1 tablet (81 mg total) by mouth daily. 05/05/14  Yes Camelia Eng Tysinger, PA-C  atorvastatin (LIPITOR) 20 MG tablet Take 1 tablet (20 mg total) by mouth daily. 09/30/14  Yes Camelia Eng Tysinger, PA-C  Fluticasone-Salmeterol (ADVAIR) 250-50 MCG/DOSE AEPB Inhale 1 puff into the lungs 2 (two) times daily. 05/05/14  Yes Camelia Eng Tysinger, PA-C  lisinopril-hydrochlorothiazide (PRINZIDE,ZESTORETIC) 20-25 MG per tablet Take 1 tablet by mouth daily. 05/05/14  Yes Camelia Eng Tysinger, PA-C  metFORMIN (GLUCOPHAGE) 500 MG tablet Take 1 tablet (500 mg total) by mouth daily with breakfast. 05/05/14  Yes Shanon Brow  S Tysinger, PA-C  amoxicillin (AMOXIL) 875 MG tablet Take 1 tablet (875 mg total) by mouth 2 (two) times daily. Patient not taking: Reported on 10/21/2014 07/01/14   Camelia Eng Tysinger, PA-C  methylPREDNIsolone (MEDROL DOSPACK) 4 MG tablet follow package directions Patient not taking: Reported on 10/21/2014 07/01/14   Camelia Eng Tysinger, PA-C  oxyCODONE-acetaminophen (PERCOCET/ROXICET) 5-325 MG per tablet Take 1 tablet by mouth every 6 (six) hours as needed for moderate pain or severe pain. 10/21/14   Jordan Hawks L  Jadarius Commons, PA-C   BP 130/79 mmHg  Pulse 89  Temp(Src) 98.1 F (36.7 C) (Oral)  Resp 16  SpO2 98% Physical Exam  Constitutional: She appears well-developed and well-nourished. No distress.  HENT:  Head: Normocephalic and atraumatic.  Eyes: Conjunctivae are normal. Right eye exhibits no discharge. Left eye exhibits no discharge.  Cardiovascular: Normal rate, regular rhythm and normal heart sounds.   Pulmonary/Chest: Effort normal and breath sounds normal. No respiratory distress. She has no wheezes.  Abdominal: Soft. Bowel sounds are normal. She exhibits no distension. There is no tenderness.  Musculoskeletal:  No midline back tenderness, step off or crepitus. Right sided lower back tenderness. Mild right groin tenderness. No CVA tenderness.   Neurological: She is alert. Coordination normal.  Equal muscle tone. 5/5 strength in lower extremities. DTR equal and intact. Negative straight leg test. Antalgic gait.   Skin: Skin is warm and dry. She is not diaphoretic.  Nursing note and vitals reviewed.   ED Course  Procedures (including critical care time) Labs Review Labs Reviewed - No data to display  Imaging Review No results found.   EKG Interpretation None      Meds given in ED:  Medications - No data to display  Discharge Medication List as of 10/21/2014  3:30 PM    START taking these medications   Details  oxyCODONE-acetaminophen (PERCOCET/ROXICET) 5-325 MG per tablet Take 1 tablet by mouth every 6 (six) hours as needed for moderate pain or severe pain., Starting 10/21/2014, Until Discontinued, Print          MDM   Final diagnoses:  Right-sided low back pain without sciatica   Patient with back pain. No loss of bowel or bladder control. No saddle anesthesia. No fever, night sweats, weight loss, h/o cancer, IVDU. VSS. No neurological deficits and normal neuro exam. Patient can walk but states is painful. No concern for cauda equina.  RICE protocol and pain medicine  indicated and discussed with patient. Driving and sedation precautions provided. Patient is afebrile, nontoxic, and in no acute distress. Patient is appropriate for outpatient management and is stable for discharge.  Discussed return precautions with patient. Discussed all results and patient verbalizes understanding and agrees with plan.     Pura Spice, PA-C 10/21/14 1624  Carmin Muskrat, MD 10/21/14 [phone]

## 2014-10-21 NOTE — ED Notes (Signed)
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 Pt ambulatory.

## 2014-10-21 NOTE — Discharge Instructions (Signed)
Return to the emergency room with worsening of symptoms, new symptoms or with symptoms that are concerning, , especially fevers, loss of control of bladder or bowels, numbness or tingling around genital region or anus, weakness. RICE: Rest, Ice (three cycles of 20 mins on, 46mns off at least twice a day), compression/brace, elevation. Heating pad works well for back pain. Ibuprofen 4013m(2 tablets 20094mevery 5-6 hours for 3-5 days.  Percocet for severe pain. Do not operate machinery, drive or drink alcohol while taking narcotics or muscle relaxers. Follow up with PCP if symptoms worsen or are persistent.   Back Injury Prevention Back injuries can be extremely painful and difficult to heal. After having one back injury, you are much more likely to experience another later on. It is important to learn how to avoid injuring or re-injuring your back. The following tips can help you to prevent a back injury. PHYSICAL FITNESS  Exercise regularly and try to develop good tone in your abdominal muscles. Your abdominal muscles provide a lot of the support needed by your back.  Do aerobic exercises (walking, jogging, biking, swimming) regularly.  Do exercises that increase balance and strength (tai chi, yoga) regularly. This can decrease your risk of falling and injuring your back.  Stretch before and after exercising.  Maintain a healthy weight. The more you weigh, the more stress is placed on your back. For every pound of weight, 10 times that amount of pressure is placed on the back. DIET  Talk to your caregiver about how much calcium and vitamin D you need per day. These nutrients help to prevent weakening of the bones (osteoporosis). Osteoporosis can cause broken (fractured) bones that lead to back pain.  Include good sources of calcium in your diet, such as dairy products, green, leafy vegetables, and products with calcium added (fortified).  Include good sources of vitamin D in your diet,  such as milk and foods that are fortified with vitamin D.  Consider taking a nutritional supplement or a multivitamin if needed.  Stop smoking if you smoke. POSTURE  Sit and stand up straight. Avoid leaning forward when you sit or hunching over when you stand.  Choose chairs with good low back (lumbar) support.  If you work at a desk, sit close to your work so you do not need to lean over. Keep your chin tucked in. Keep your neck drawn back and elbows bent at a right angle. Your arms should look like the letter "L."  Sit high and close to the steering wheel when you drive. Add a lumbar support to your car seat if needed.  Avoid sitting or standing in one position for too long. Take breaks to get up, stretch, and walk around at least once every hour. Take breaks if you are driving for long periods of time.  Sleep on your side with your knees slightly bent, or sleep on your back with a pillow under your knees. Do not sleep on your stomach. LIFTING, TWISTING, AND REACHING  Avoid heavy lifting, especially repetitive lifting. If you must do heavy lifting:  Stretch before lifting.  Work slowly.  Rest between lifts.  Use carts and dollies to move objects when possible.  Make several small trips instead of carrying 1 heavy load.  Ask for help when you need it.  Ask for help when moving big, awkward objects.  Follow these steps when lifting:  Stand with your feet shoulder-width apart.  Get as close to the object as you can.  Do not try to pick up heavy objects that are far from your body.  Use handles or lifting straps if they are available.  Bend at your knees. Squat down, but keep your heels off the floor.  Keep your shoulders pulled back, your chin tucked in, and your back straight.  Lift the object slowly, tightening the muscles in your legs, abdomen, and buttocks. Keep the object as close to the center of your body as possible.  When you put a load down, use these same  guidelines in reverse.  Do not:  Lift the object above your waist.  Twist at the waist while lifting or carrying a load. Move your feet if you need to turn, not your waist.  Bend over without bending at your knees.  Avoid reaching over your head, across a table, or for an object on a high surface. OTHER TIPS  Avoid wet floors and keep sidewalks clear of ice to prevent falls.  Do not sleep on a mattress that is too soft or too hard.  Keep items that are used frequently within easy reach.  Put heavier objects on shelves at waist level and lighter objects on lower or higher shelves.  Find ways to decrease your stress, such as exercise, massage, or relaxation techniques. Stress can build up in your muscles. Tense muscles are more vulnerable to injury.  Seek treatment for depression or anxiety if needed. These conditions can increase your risk of developing back pain. SEEK MEDICAL CARE IF:  You injure your back.  You have questions about diet, exercise, or other ways to prevent back injuries. MAKE SURE YOU:  Understand these instructions.  Will watch your condition.  Will get help right away if you are not doing well or get worse. Document Released: 11/21/2004 Document Revised: 01/06/2012 Document Reviewed: 11/25/2011 [facility] Patient Information 2015 [place], [place]. This information is not intended to replace advice given to you by your health care provider. Make sure you discuss any questions you have with your health care provider.

## 2014-10-21 NOTE — ED Notes (Signed)
NP at bedside.

## 2014-10-31 ENCOUNTER — Other Ambulatory Visit: Payer: Self-pay | Admitting: Medical

## 2014-10-31 ENCOUNTER — Other Ambulatory Visit: Payer: Self-pay

## 2014-10-31 DIAGNOSIS — Z1231 Encounter for screening mammogram for malignant neoplasm of breast: Secondary | ICD-10-CM

## 2014-11-02 ENCOUNTER — Ambulatory Visit (INDEPENDENT_AMBULATORY_CARE_PROVIDER_SITE_OTHER): Payer: BLUE CROSS/BLUE SHIELD | Admitting: Medical

## 2014-11-02 ENCOUNTER — Ambulatory Visit
Admission: RE | Admit: 2014-11-02 | Discharge: 2014-11-02 | Disposition: A | Discharge status: Home / Self Care | Payer: BLUE CROSS/BLUE SHIELD | Source: Ambulatory Visit | Attending: Medical | Admitting: Medical

## 2014-11-02 VITALS — BP 110/70 | HR 87 | Temp 97.8°F | Resp 15 | Wt 260.0 lb

## 2014-11-02 DIAGNOSIS — R059 Cough, unspecified: Secondary | ICD-10-CM

## 2014-11-02 DIAGNOSIS — R05 Cough: Secondary | ICD-10-CM

## 2014-11-02 DIAGNOSIS — M542 Cervicalgia: Secondary | ICD-10-CM

## 2014-11-02 DIAGNOSIS — R634 Abnormal weight loss: Secondary | ICD-10-CM

## 2014-11-02 DIAGNOSIS — F17200 Nicotine dependence, unspecified, uncomplicated: Secondary | ICD-10-CM

## 2014-11-02 DIAGNOSIS — Z720 Tobacco use: Secondary | ICD-10-CM

## 2014-11-02 DIAGNOSIS — M62830 Muscle spasm of back: Secondary | ICD-10-CM

## 2014-11-02 NOTE — Progress Notes (Signed)
Subjective: Here for c/o neck and shoulder pain.  Has had similar pains in the past, used Flexeril prn.   She denies recent trauma or injury or strenuous activity. Just started hurting and neck and both shoulders 2 weeks ago. Pain with neck range of motion. No pain in the arms otherwise no pain with shoulder range of motion, no arm numbness tingling or severe pain. Using over-the-counter pain medication. No other aggravating or relieving factors.  She reports losing weight in the last few months unexpectedly. He is a smoker. She does have a dry cough. She denies productive cough, no hemoptysis, no fevers or night sweats, no prior colonoscopy. She is up-to-date on mammogram. Due for Pap smear at this time.  She denies bleeding, no lumps masses or skin changes.  Family history includes 2 cousins with breast cancer, aunt with vaginal cancer, mother had vaginal cancer.   ROS as in subjective otherwise  Past Medical History  Diagnosis Date  . Hypertension   . Asthma   . GERD (gastroesophageal reflux disease)   . Migraines   . Hyperlipidemia   . Diabetes mellitus     vs impaired fasting glucose   Objective: BP 110/70 mmHg  Pulse 87  Temp(Src) 97.8 F (36.6 C) (Oral)  Resp 15  Wt 260 lb (117.935 kg)  Wt Readings from Last 3 Encounters:  11/02/14 260 lb (117.935 kg)  07/01/14 272 lb (123.378 kg)  05/05/14 282 lb (127.914 kg)   General appearance: alert, no distress, WD/WN Skin: No worrisome findings, warm dry Neck tender bilaterally and posteriorly, no mass, no lymphadenopathy, no thyromegaly, range of motion reduced to about 80% of normal in general Musculoskeletal: Shoulders nontender, normal range of motion, no other arm pain swelling or deformity Tender in upper back paraspinal muscles bilaterally Lungs clear Heart: RRR, normal S1, S2, no murmurs Abdomen: +bs, soft, non tender, non distended, no masses, no hepatomegaly, no splenomegaly Pulses: 2+ symmetric, upper and lower  extremities, normal cap refill Extremities: No edema    Assessment: Encounter Diagnoses  Name Primary?  . Smoker Yes  . Cough   . Loss of weight   . Neck pain   . Back spasm    Plan: Send for chest x-ray and neck x-ray. She has Flexeril at home she can use 1/2-1 tablet 1-2 times daily, over-the-counter NSAID, and we will call with x-ray results  She will return soon for full physical, cancer screening updates, labs, other evaluation for her symptoms  Of note she is also on ACE inhibitor which could cause dry cough

## 2014-11-03 NOTE — Progress Notes (Signed)
LM to [facility] 

## 2014-11-07 ENCOUNTER — Other Ambulatory Visit (INDEPENDENT_AMBULATORY_CARE_PROVIDER_SITE_OTHER): Payer: BLUE CROSS/BLUE SHIELD

## 2014-11-07 DIAGNOSIS — R634 Abnormal weight loss: Secondary | ICD-10-CM

## 2014-11-07 LAB — POC HEMOCCULT BLD/STL (HOME/3-CARD/SCREEN)
Card #1 Date: 1072016
Card #2 Date: 1082016
Card #2 Fecal Occult Blod, POC: NEGATIVE
Card #3 Date: 1092016
Card #3 Fecal Occult Blood, POC: NEGATIVE
Fecal Occult Blood, POC: NEGATIVE

## 2014-11-21 ENCOUNTER — Encounter: Payer: BLUE CROSS/BLUE SHIELD | Admitting: Medical

## 2014-11-30 ENCOUNTER — Telehealth: Payer: Self-pay | Admitting: Medical

## 2014-11-30 ENCOUNTER — Encounter: Payer: Self-pay | Admitting: Medical

## 2014-11-30 ENCOUNTER — Other Ambulatory Visit (HOSPITAL_COMMUNITY)
Admission: RE | Admit: 2014-11-30 | Discharge: 2014-11-30 | Disposition: A | Discharge status: Home / Self Care | Payer: BLUE CROSS/BLUE SHIELD | Source: Ambulatory Visit | Attending: Medical | Admitting: Medical

## 2014-11-30 ENCOUNTER — Ambulatory Visit (INDEPENDENT_AMBULATORY_CARE_PROVIDER_SITE_OTHER): Payer: BLUE CROSS/BLUE SHIELD | Admitting: Medical

## 2014-11-30 VITALS — BP 120/70 | HR 90 | Temp 98.0°F | Ht 71.0 in | Wt 258.0 lb

## 2014-11-30 DIAGNOSIS — I10 Essential (primary) hypertension: Secondary | ICD-10-CM | POA: Diagnosis not present

## 2014-11-30 DIAGNOSIS — Z23 Encounter for immunization: Secondary | ICD-10-CM

## 2014-11-30 DIAGNOSIS — Z803 Family history of malignant neoplasm of breast: Secondary | ICD-10-CM | POA: Diagnosis not present

## 2014-11-30 DIAGNOSIS — Z01419 Encounter for gynecological examination (general) (routine) without abnormal findings: Secondary | ICD-10-CM | POA: Insufficient documentation

## 2014-11-30 DIAGNOSIS — Z720 Tobacco use: Secondary | ICD-10-CM | POA: Insufficient documentation

## 2014-11-30 DIAGNOSIS — R1011 Right upper quadrant pain: Secondary | ICD-10-CM

## 2014-11-30 DIAGNOSIS — Z1151 Encounter for screening for human papillomavirus (HPV): Secondary | ICD-10-CM | POA: Diagnosis present

## 2014-11-30 DIAGNOSIS — R7301 Impaired fasting glucose: Secondary | ICD-10-CM

## 2014-11-30 DIAGNOSIS — A6000 Herpesviral infection of urogenital system, unspecified: Secondary | ICD-10-CM

## 2014-11-30 DIAGNOSIS — F17200 Nicotine dependence, unspecified, uncomplicated: Secondary | ICD-10-CM

## 2014-11-30 DIAGNOSIS — Z0000 Encounter for general adult medical examination without abnormal findings: Secondary | ICD-10-CM

## 2014-11-30 DIAGNOSIS — J4541 Moderate persistent asthma with (acute) exacerbation: Secondary | ICD-10-CM | POA: Insufficient documentation

## 2014-11-30 DIAGNOSIS — J4540 Moderate persistent asthma, uncomplicated: Secondary | ICD-10-CM | POA: Diagnosis not present

## 2014-11-30 DIAGNOSIS — E785 Hyperlipidemia, unspecified: Secondary | ICD-10-CM | POA: Diagnosis not present

## 2014-11-30 DIAGNOSIS — R634 Abnormal weight loss: Secondary | ICD-10-CM

## 2014-11-30 DIAGNOSIS — Z124 Encounter for screening for malignant neoplasm of cervix: Secondary | ICD-10-CM

## 2014-11-30 DIAGNOSIS — Z113 Encounter for screening for infections with a predominantly sexual mode of transmission: Secondary | ICD-10-CM | POA: Diagnosis present

## 2014-11-30 DIAGNOSIS — Z1239 Encounter for other screening for malignant neoplasm of breast: Secondary | ICD-10-CM

## 2014-11-30 HISTORY — DX: Herpesviral infection of urogenital system, unspecified: A60.00

## 2014-11-30 LAB — CBC WITH DIFFERENTIAL/PLATELET
Basophils Absolute: 0 10*3/uL (ref 0.0–0.1)
Basophils Relative: 0 % (ref 0–1)
Eosinophils Absolute: 0.4 10*3/uL (ref 0.0–0.7)
Eosinophils Relative: 6 % — ABNORMAL HIGH (ref 0–5)
HCT: 37.2 % (ref 36.0–46.0)
Hemoglobin: 12 g/dL (ref 12.0–15.0)
Lymphocytes Relative: 37 % (ref 12–46)
Lymphs Abs: 2.4 10*3/uL (ref 0.7–4.0)
MCH: 27.1 pg (ref 26.0–34.0)
MCHC: 32.3 g/dL (ref 30.0–36.0)
MCV: 84.2 fL (ref 78.0–100.0)
MPV: 10.2 fL (ref 8.6–12.4)
Monocytes Absolute: 0.7 10*3/uL (ref 0.1–1.0)
Monocytes Relative: 11 % (ref 3–12)
Neutro Abs: 2.9 10*3/uL (ref 1.7–7.7)
Neutrophils Relative %: 46 % (ref 43–77)
Platelets: 290 10*3/uL (ref 150–400)
RBC: 4.42 MIL/uL (ref 3.87–5.11)
RDW: 20.7 % — ABNORMAL HIGH (ref 11.5–15.5)
WBC: 6.4 10*3/uL (ref 4.0–10.5)

## 2014-11-30 LAB — POCT URINALYSIS DIPSTICK
Bilirubin, UA: NEGATIVE
Blood, UA: NEGATIVE
Glucose, UA: NEGATIVE
Ketones, UA: NEGATIVE
Leukocytes, UA: NEGATIVE
Nitrite, UA: NEGATIVE
Protein, UA: NEGATIVE
Spec Grav, UA: >=1.03
Urobilinogen, UA: NEGATIVE
pH, UA: 6

## 2014-11-30 LAB — COMPREHENSIVE METABOLIC PANEL
ALT: 15 U/L (ref 0–35)
AST: 19 U/L (ref 0–37)
Albumin: 3.7 g/dL (ref 3.5–5.2)
Alkaline Phosphatase: 76 U/L (ref 39–117)
BUN: 10 mg/dL (ref 6–23)
CO2: 25 mEq/L (ref 19–32)
Calcium: 9.3 mg/dL (ref 8.4–10.5)
Chloride: 100 mEq/L (ref 96–112)
Creat: 0.62 mg/dL (ref 0.50–1.10)
Glucose, Bld: 99 mg/dL (ref 70–99)
Potassium: 3.7 mEq/L (ref 3.5–5.3)
Sodium: 140 mEq/L (ref 135–145)
Total Bilirubin: 0.3 mg/dL (ref 0.2–1.2)
Total Protein: 7.8 g/dL (ref 6.0–8.3)

## 2014-11-30 LAB — HEMOGLOBIN A1C
Hgb A1c MFr Bld: 5.9 % — ABNORMAL HIGH (ref <5.7)
Mean Plasma Glucose: 123 mg/dL — ABNORMAL HIGH (ref <117)

## 2014-11-30 LAB — LIPID PANEL
Cholesterol: 117 mg/dL (ref 0–200)
HDL: 48 mg/dL (ref >39)
LDL Cholesterol: 29 mg/dL (ref 0–99)
Total CHOL/HDL Ratio: 2.4 Ratio
Triglycerides: 201 mg/dL — ABNORMAL HIGH (ref <150)
VLDL: 40 mg/dL (ref 0–40)

## 2014-11-30 NOTE — Patient Instructions (Signed)
  Thank you for giving me the opportunity to serve you today.    Your diagnosis today includes: Encounter Diagnoses  Name Primary?  . Encounter for health maintenance examination in adult Yes  . Smoker   . Essential hypertension   . Weight loss   . Hyperlipidemia   . Impaired fasting blood sugar   . RUQ pain   . Need for Tdap vaccination   . Screening for cervical cancer   . Screening for breast cancer   . Family history of breast cancer   . Genital herpes   . Asthma, moderate persistent, uncomplicated      Specific recommendations today include: See your eye doctor yearly for routine vision care. See your dentist yearly for routine dental care including hygiene visits twice yearly. Go for mammogram soon We will call with lab results Stop smoking Continue your current medications as usual Continue Advair regularly and recheck if problems with wheezing/SOB before asthma exacerbation We updated your tetanus, diphtheria, pertussis vaccine today Regarding weight loss, pending Pap, mammogram, labs, there may be other evaluation that will need to be done  Return pending labs.

## 2014-11-30 NOTE — Telephone Encounter (Signed)
See message.

## 2014-11-30 NOTE — Telephone Encounter (Signed)
Refer to [facility] or [facility] for abnormal appearing cervix on examination.   Make sure we send OV notes, labs, pap

## 2014-11-30 NOTE — Progress Notes (Addendum)
Subjective:   HPI  Sydney Williams is a 49 y.o. female who presents for a complete physical.   Preventative care: Last ophthalmology visit: YES [facility] Last dental visit:YES Last colonoscopy:N/A Last mammogram:11/2013 Last gynecological exam:WILL GET TODAY Last EKG:01/2014 Last labs: HGBA1C ON 7/15  Prior vaccinations: TD or Tdap:UNSURE Influenza:08/2014 AT WORK Pneumococcal: ALLERGIC TO THIS VACCINE  Concerns: Ongoing unexplained weight loss.   Denies wheezing, shortness of breath, skin or hair changes, polydipsia, polyuria , no blood in the stool or urine, no appetite change no abdominal pain no back pain no rash  Last menstrual period about a month ago,  periods have been regular. She notes missing a period about a year ago in January , no heavy bleeding.  Reviewed their medical, surgical, family, social, medication, and allergy history and updated chart as appropriate.  Past Medical History  Diagnosis Date  . Hypertension   . GERD (gastroesophageal reflux disease)   . Migraines   . Hyperlipidemia   . Diabetes mellitus     vs impaired fasting glucose  . Asthma     2015 hospitaliation for asthma    Past Surgical History  Procedure Laterality Date  . Tubal ligation    . Cyst excision      left neck/postauricular region, benign    History   Social History  . Marital Status: Married    Spouse Name: N/A    Number of Children: N/A  . Years of Education: N/A   Occupational History  . Not on file.   Social History Main Topics  . Smoking status: Current Every Day Smoker -- 0.25 packs/day for 10 years    Types: Cigarettes  . Smokeless tobacco: Never Used  . Alcohol Use: No  . Drug Use: No  . Sexual Activity: Yes    Birth Control/ Protection: Surgical   Other Topics Concern  . Not on file   Social History Narrative   Married, and her granddaughter lives with her, works as Quarry manager at [employer], exercise - activity at work, walking.    Family History   Problem Relation Age of Onset  . Diabetes Mother   . Hypertension Mother   . Aneurysm Mother   . Stroke Mother   . Cancer Mother     cervical cancer  . Cancer Maternal Aunt     breast  . Heart disease Neg Hx   . Cancer Cousin     breast/breast     Current outpatient prescriptions:  .  acyclovir (ZOVIRAX) 200 MG capsule, Take 200 mg by mouth 2 (two) times daily., Disp: , Rfl:  .  albuterol (PROVENTIL HFA;VENTOLIN HFA) 108 (90 BASE) MCG/ACT inhaler, Inhale 2 puffs into the lungs every 6 (six) hours as needed for wheezing., Disp: 1 Inhaler, Rfl: 0 .  albuterol (PROVENTIL) (2.5 MG/3ML) 0.083% nebulizer solution, Take 3 mLs (2.5 mg total) by nebulization every 6 (six) hours as needed for wheezing or shortness of breath., Disp: 75 mL, Rfl: 1 .  amLODipine (NORVASC) 5 MG tablet, Take 1 tablet (5 mg total) by mouth daily., Disp: 90 tablet, Rfl: 1 .  aspirin EC 81 MG tablet, Take 1 tablet (81 mg total) by mouth daily., Disp: 90 tablet, Rfl: 1 .  atorvastatin (LIPITOR) 20 MG tablet, Take 1 tablet (20 mg total) by mouth daily., Disp: 30 tablet, Rfl: 4 .  Fluticasone-Salmeterol (ADVAIR) 250-50 MCG/DOSE AEPB, Inhale 1 puff into the lungs 2 (two) times daily., Disp: 60 each, Rfl: 3 .  lisinopril-hydrochlorothiazide (  PRINZIDE,ZESTORETIC) 20-25 MG per tablet, TAKE ONE TABLET BY MOUTH ONCE DAILY, Disp: 90 tablet, Rfl: 0 .  metFORMIN (GLUCOPHAGE) 500 MG tablet, Take 1 tablet (500 mg total) by mouth daily with breakfast., Disp: 90 tablet, Rfl: 1  Current facility-administered medications:  .  ipratropium-albuterol (DUONEB) 0.5-2.5 (3) MG/3ML nebulizer solution 3 mL, 3 mL, Nebulization, Once, Carlena Hurl, PA-C  Allergies  Allergen Reactions  . Pneumococcal Vaccines Swelling    Swelling at injection site   . Vicodin [Hydrocodone-Acetaminophen] Rash    Review of Systems Constitutional: -fever, -chills, -sweats, +unexpected weight change, -decreased appetite, -fatigue Allergy: -sneezing,  -itching, -congestion Dermatology: -changing moles, --rash, -lumps ENT: -runny nose, -ear pain, -sore throat, -hoarseness, -sinus pain, -teeth pain, - ringing in ears, -hearing loss, -nosebleeds Cardiology: -chest pain, -palpitations, -swelling, -difficulty breathing when lying flat, -waking up short of breath Respiratory: -cough, -shortness of breath, -difficulty breathing with exercise or exertion, -wheezing, -coughing up blood Gastroenterology: -abdominal pain, -nausea, -vomiting, -diarrhea, -constipation, -blood in stool, -changes in bowel movement, -difficulty swallowing or eating Hematology: -bleeding, -bruising  Musculoskeletal: -joint aches, -muscle aches, -joint swelling, +back pain, -neck pain, -cramping, -changes in gait Ophthalmology: denies vision changes, eye redness, itching, discharge Urology: -burning with urination, -difficulty urinating, -blood in urine, -urinary frequency, -urgency, -incontinence Neurology: +headache, -weakness, -tingling, -numbness, -memory loss, -falls, -dizziness Psychology: -depressed mood, -agitation, -sleep problems     Objective:   Physical Exam  BP 120/70 mmHg  Pulse 90  Temp(Src) 98 F (36.7 C) (Oral)  Ht 5\' 11"  (1.803 m)  Wt 258 lb (117.028 kg)  BMI 36.00 kg/m2  Wt Readings from Last 3 Encounters:  11/30/14 258 lb (117.028 kg)  11/02/14 260 lb (117.935 kg)  07/01/14 272 lb (123.378 kg)   BP Readings from Last 3 Encounters:  11/30/14 120/70  11/02/14 110/70  10/21/14 130/79   General appearance: alert, no distress, WD/WN, obese AA female Skin:  Tattoo right upper back, tattoo right upper chest, few scattered benign-appearing macules HEENT: normocephalic, conjunctiva/corneas normal, sclerae anicteric, PERRLA, EOMi, nares patent, no discharge or erythema, pharynx normal Oral cavity: MMM, tongue normal, teeth  With moderate plaque otherwise in normal repair Neck: supple, no lymphadenopathy, no thyromegaly, no masses, normal ROM , no  bruits Chest: non tender, normal shape and expansion Heart: RRR, normal S1, S2, no murmurs Lungs: CTA bilaterally, no wheezes, rhonchi, or rales Abdomen: +bs, soft, non tender, non distended, no masses, no hepatomegaly, no splenomegaly, no bruits Back: non tender, normal ROM, no scoliosis Musculoskeletal: upper extremities non tender, no obvious deformity, normal ROM throughout, lower extremities non tender, no obvious deformity, normal ROM throughout Extremities: no edema, no cyanosis, no clubbing Pulses: 2+ symmetric, upper and lower extremities, normal cap refill Neurological: alert, oriented x 3, CN2-12 intact, strength normal upper extremities and lower extremities, sensation normal throughout, DTRs 2+ throughout, no cerebellar signs, gait normal Psychiatric: normal affect, behavior normal, pleasant  Breast: nontender, no masses or lumps, no skin changes, no nipple discharge or inversion, no axillary lymphadenopathy Gyn: Normal external genitalia without lesions, vagina with normal mucosa, cervix with abnormal appearance, whitish and grayish coloration, no cervical motion tenderness, no abnormal vaginal discharge.  Uterus and adnexa not enlarged, nontender, no masses.  Pap performed.  Exam chaperoned by nurse. Rectal: anus normal appearing   Adult ECG Report  Indication: HTN, physical  Rate: 81 bpm  Rhythm: normal sinus rhythm  QRS Axis: 4 degrees  PR Interval: 131ms  QRS Duration: 28ms  QTc: 456ms  Conduction Disturbances:  none  Other Abnormalities: none  Patient's cardiac risk factors are: dyslipidemia, hypertension, obesity (BMI >= 30 kg/m2) and smoking/ tobacco exposure.  EKG comparison: 2014   Narrative Interpretation: no acute changes     Assessment and Plan :    Encounter Diagnoses  Name Primary?  . Encounter for health maintenance examination in adult Yes  . Smoker   . Essential hypertension   . Weight loss   . Hyperlipidemia   . Impaired fasting blood sugar   .  RUQ pain   . Need for Tdap vaccination   . Screening for cervical cancer   . Screening for breast cancer   . Family history of breast cancer   . Genital herpes   . Asthma, moderate persistent, uncomplicated       Physical exam - discussed healthy lifestyle, diet, exercise, preventative care, vaccinations, and addressed their concerns.  Handout given.  We discussed her concerns, weight loss issues of recent. We discussed possible evaluation pending testing today  We discussed her maternal aunt and maternal cousins all with breast cancer and possibly doing testing testing for breast cancer genes   hypertension-continue current medication   Impaired glucose- Continue metformin but needs to do better with diet, exercise  Specific recommendations today include: See your eye doctor yearly for routine vision care. See your dentist yearly for routine dental care including hygiene visits twice yearly. Go for mammogram soon We will call with lab results Stop smoking Continue your current medications as usual Continue Advair regularly and recheck if problems with wheezing/SOB before asthma exacerbation We updated your tetanus, diphtheria, pertussis vaccine today Regarding weight loss, pending Pap, mammogram, labs, there may be other evaluation that will need to be done  Follow-up pending labs

## 2014-12-01 LAB — MICROALBUMIN / CREATININE URINE RATIO
Creatinine, Urine: 161.4 mg/dL
Microalb Creat Ratio: 8.7 mg/g (ref 0.0–30.0)
Microalb, Ur: 1.4 mg/dL (ref <2.0)

## 2014-12-01 LAB — TSH: TSH: 1.218 u[IU]/mL (ref 0.350–4.500)

## 2014-12-01 MED ORDER — ALBUTEROL SULFATE (2.5 MG/3ML) 0.083% IN NEBU: 2.5000 mg | INHALATION_SOLUTION | Freq: Four times a day (QID) | RESPIRATORY_TRACT | Status: DC | PRN

## 2014-12-01 MED ORDER — ALBUTEROL SULFATE HFA 108 (90 BASE) MCG/ACT IN AERS: 2.0000 | INHALATION_SPRAY | Freq: Four times a day (QID) | RESPIRATORY_TRACT | Status: DC | PRN

## 2014-12-01 MED ORDER — ASPIRIN EC 81 MG PO TBEC: 81.0000 mg | DELAYED_RELEASE_TABLET | Freq: Every day | ORAL | Status: DC

## 2014-12-01 MED ORDER — LISINOPRIL-HYDROCHLOROTHIAZIDE 20-25 MG PO TABS: 1.0000 | ORAL_TABLET | Freq: Every day | ORAL | Status: DC

## 2014-12-01 MED ORDER — ATORVASTATIN CALCIUM 10 MG PO TABS: 10.0000 mg | ORAL_TABLET | Freq: Every day | ORAL | Status: DC

## 2014-12-01 MED ORDER — FLUTICASONE-SALMETEROL 250-50 MCG/DOSE IN AEPB: 1.0000 | INHALATION_SPRAY | Freq: Two times a day (BID) | RESPIRATORY_TRACT | Status: DC

## 2014-12-01 MED ORDER — METFORMIN HCL 500 MG PO TABS: 500.0000 mg | ORAL_TABLET | Freq: Every day | ORAL | Status: DC

## 2014-12-01 MED ORDER — AMLODIPINE BESYLATE 5 MG PO TABS: 5.0000 mg | ORAL_TABLET | Freq: Every day | ORAL | Status: DC

## 2014-12-01 NOTE — Telephone Encounter (Signed)
Patient confirms that she knows about 12/06/14 appointment with Dr. Matthew Saras.

## 2014-12-01 NOTE — Addendum Note (Signed)
Addended by: Carlena Hurl on: 12/01/2014 07:47 AM   Modules accepted: Orders, Medications

## 2014-12-01 NOTE — Progress Notes (Signed)
LM to [facility] 

## 2014-12-01 NOTE — Telephone Encounter (Signed)
Appointment Dr. Matthew Saras [facility] Feb 9th 11:00 am. I left message for patient to call back.

## 2014-12-06 LAB — CYTOLOGY - PAP

## 2014-12-09 ENCOUNTER — Ambulatory Visit
Admission: RE | Admit: 2014-12-09 | Discharge: 2014-12-09 | Disposition: A | Discharge status: Home / Self Care | Payer: BLUE CROSS/BLUE SHIELD | Source: Ambulatory Visit

## 2014-12-09 DIAGNOSIS — Z1231 Encounter for screening mammogram for malignant neoplasm of breast: Secondary | ICD-10-CM

## 2014-12-20 NOTE — Addendum Note (Signed)
Addended by: Carlena Hurl on: 12/20/2014 07:51 AM   Modules accepted: Level of Service

## 2014-12-26 ENCOUNTER — Telehealth: Payer: Self-pay | Admitting: Medical

## 2014-12-26 ENCOUNTER — Other Ambulatory Visit: Payer: Self-pay | Admitting: Medical

## 2014-12-26 MED ORDER — METHYLPREDNISOLONE (PAK) 4 MG PO TABS: ORAL_TABLET | ORAL | Status: DC

## 2014-12-26 NOTE — Telephone Encounter (Signed)
Pt was offer and appt on Wednesday. Pt states she is using adviar 2 times a day. Has used the albuterol 4 times already today and did nebulizer twice. Pt has not been sick recently. Pt is having trouble breathing, shane has sent seteriods to her pharmacy to get her through until she can come in for an appt

## 2014-12-26 NOTE — Telephone Encounter (Signed)
Offer an appt  1-is she using Advair BID? 2- is she using albuterol handheld or nebulized?  If so, how often is she using it? 3-if using albuterol 4+ times daily and Advair and still having trouble breathing, needs appt, particular if sick.

## 2014-12-26 NOTE — Telephone Encounter (Signed)
Please call nebulizer meds not helping , states she needs Rx for her steroids

## 2014-12-28 ENCOUNTER — Ambulatory Visit: Payer: BLUE CROSS/BLUE SHIELD | Admitting: Medical

## 2015-02-10 ENCOUNTER — Ambulatory Visit: Payer: BLUE CROSS/BLUE SHIELD | Admitting: Family Medicine

## 2015-03-19 ENCOUNTER — Emergency Department (HOSPITAL_COMMUNITY): Payer: BLUE CROSS/BLUE SHIELD

## 2015-03-19 ENCOUNTER — Emergency Department (HOSPITAL_COMMUNITY)
Admission: EM | Admit: 2015-03-19 | Discharge: 2015-03-19 | Disposition: A | Discharge status: Home / Self Care | Payer: BLUE CROSS/BLUE SHIELD | Attending: Emergency Medicine | Admitting: Emergency Medicine

## 2015-03-19 ENCOUNTER — Encounter (HOSPITAL_COMMUNITY): Payer: Self-pay | Admitting: Emergency Medicine

## 2015-03-19 DIAGNOSIS — G43909 Migraine, unspecified, not intractable, without status migrainosus: Secondary | ICD-10-CM | POA: Diagnosis not present

## 2015-03-19 DIAGNOSIS — R0789 Other chest pain: Secondary | ICD-10-CM | POA: Diagnosis not present

## 2015-03-19 DIAGNOSIS — J45901 Unspecified asthma with (acute) exacerbation: Secondary | ICD-10-CM | POA: Diagnosis not present

## 2015-03-19 DIAGNOSIS — I10 Essential (primary) hypertension: Secondary | ICD-10-CM | POA: Insufficient documentation

## 2015-03-19 DIAGNOSIS — E785 Hyperlipidemia, unspecified: Secondary | ICD-10-CM | POA: Diagnosis not present

## 2015-03-19 DIAGNOSIS — Z79899 Other long term (current) drug therapy: Secondary | ICD-10-CM | POA: Insufficient documentation

## 2015-03-19 DIAGNOSIS — Z7982 Long term (current) use of aspirin: Secondary | ICD-10-CM | POA: Diagnosis not present

## 2015-03-19 DIAGNOSIS — Z720 Tobacco use: Secondary | ICD-10-CM | POA: Diagnosis not present

## 2015-03-19 DIAGNOSIS — Z8719 Personal history of other diseases of the digestive system: Secondary | ICD-10-CM | POA: Diagnosis not present

## 2015-03-19 DIAGNOSIS — E119 Type 2 diabetes mellitus without complications: Secondary | ICD-10-CM | POA: Diagnosis not present

## 2015-03-19 DIAGNOSIS — R0981 Nasal congestion: Secondary | ICD-10-CM | POA: Diagnosis present

## 2015-03-19 DIAGNOSIS — Z7951 Long term (current) use of inhaled steroids: Secondary | ICD-10-CM | POA: Insufficient documentation

## 2015-03-19 LAB — TROPONIN I: Troponin I: <0.03 ng/mL (ref <0.031)

## 2015-03-19 LAB — CBC
HCT: 36.6 % (ref 36.0–46.0)
Hemoglobin: 12 g/dL (ref 12.0–15.0)
MCH: 28.4 pg (ref 26.0–34.0)
MCHC: 32.8 g/dL (ref 30.0–36.0)
MCV: 86.7 fL (ref 78.0–100.0)
Platelets: 342 10*3/uL (ref 150–400)
RBC: 4.22 MIL/uL (ref 3.87–5.11)
RDW: 16.4 % — ABNORMAL HIGH (ref 11.5–15.5)
WBC: 7 10*3/uL (ref 4.0–10.5)

## 2015-03-19 LAB — BASIC METABOLIC PANEL
Anion gap: 11 (ref 5–15)
BUN: 7 mg/dL (ref 6–20)
CO2: 27 mmol/L (ref 22–32)
Calcium: 8.7 mg/dL — ABNORMAL LOW (ref 8.9–10.3)
Chloride: 99 mmol/L — ABNORMAL LOW (ref 101–111)
Creatinine, Ser: 0.57 mg/dL (ref 0.44–1.00)
GFR calc Af Amer: >60 mL/min (ref >60)
GFR calc non Af Amer: >60 mL/min (ref >60)
Glucose, Bld: 85 mg/dL (ref 65–99)
Potassium: 3.3 mmol/L — ABNORMAL LOW (ref 3.5–5.1)
Sodium: 137 mmol/L (ref 135–145)

## 2015-03-19 MED ORDER — PREDNISONE 20 MG PO TABS
60.0000 mg | ORAL_TABLET | Freq: Once | ORAL | Status: AC
  Administered 2015-03-19: 60 mg via ORAL
  Filled 2015-03-19: qty 3

## 2015-03-19 MED ORDER — PREDNISONE 20 MG PO TABS: 20.0000 mg | ORAL_TABLET | Freq: Two times a day (BID) | ORAL | Status: DC

## 2015-03-19 MED ORDER — IPRATROPIUM-ALBUTEROL 0.5-2.5 (3) MG/3ML IN SOLN
3.0000 mL | Freq: Once | RESPIRATORY_TRACT | Status: AC
  Administered 2015-03-19: 3 mL via RESPIRATORY_TRACT
  Filled 2015-03-19: qty 3

## 2015-03-19 MED ORDER — BENZONATATE 100 MG PO CAPS: 100.0000 mg | ORAL_CAPSULE | Freq: Three times a day (TID) | ORAL | Status: DC

## 2015-03-19 MED ORDER — IBUPROFEN 200 MG PO TABS
600.0000 mg | ORAL_TABLET | Freq: Once | ORAL | Status: AC
  Administered 2015-03-19: 600 mg via ORAL
  Filled 2015-03-19: qty 3

## 2015-03-19 NOTE — ED Notes (Signed)
Pt from home reports a hx of asthma. Pt sts that she started to have productive cough yesterday with greenish sputum. Pt adds that she has nasal congestion as well as CP that started this am. Pt is A&O and in NAD. Pt has expiratory wheezing bilaterally, O2 sats 97% on RA. Pt rates CP 8/10

## 2015-03-19 NOTE — ED Provider Notes (Signed)
CSN: [id number]     Arrival date & time 03/19/15  8502 History   First MD Initiated Contact with Patient 03/19/15 [phone]     Chief Complaint  Patient presents with  . Cough  . Nasal Congestion  . Chest Pain     (Consider location/radiation/quality/duration/timing/severity/associated sxs/prior Treatment) HPI Sydney Williams is a 49 year old female past medical history of hypertension, diabetes, asthma who presents the ER complaining of shortness of breath, productive cough, chest congestion, chest discomfort. Patient reports productive cough since yesterday with associated wheezing, shortness of breath. Patient states she's used her albuterol nebulizer at home with mild relief. Patient reports worsening of her symptoms this morning. Patient reports that this morning she is also been experiencing a centralized chest discomfort which she describes as a sharp pain, only present when coughing. She states the pain is also present when taking a deep breath. Patient denies fever, chills, nausea, vomiting, palpitations, headache, blurred vision, dizziness, weakness, abdominal pain.  Past Medical History  Diagnosis Date  . Hypertension   . GERD (gastroesophageal reflux disease)   . Migraines   . Hyperlipidemia   . Diabetes mellitus     vs impaired fasting glucose  . Asthma     2015 hospitaliation for asthma   Past Surgical History  Procedure Laterality Date  . Tubal ligation    . Cyst excision      left neck/postauricular region, benign   Family History  Problem Relation Age of Onset  . Diabetes Mother   . Hypertension Mother   . Aneurysm Mother   . Stroke Mother   . Cancer Mother     cervical cancer  . Cancer Maternal Aunt     breast  . Heart disease Neg Hx   . Cancer Cousin     breast/breast   History  Substance Use Topics  . Smoking status: Current Every Day Smoker -- 0.25 packs/day for 10 years    Types: Cigarettes  . Smokeless tobacco: Never Used  . Alcohol Use: No   OB  History    No data available     Review of Systems  Constitutional: Negative for fever.  HENT: Negative for trouble swallowing.   Eyes: Negative for visual disturbance.  Respiratory: Positive for cough, chest tightness, shortness of breath and wheezing.   Cardiovascular: Negative for chest pain.  Gastrointestinal: Negative for nausea, vomiting and abdominal pain.  Genitourinary: Negative for dysuria.  Musculoskeletal: Negative for neck pain.  Skin: Negative for rash.  Neurological: Negative for dizziness, weakness and numbness.  Psychiatric/Behavioral: Negative.       Allergies  Pneumococcal vaccines and Vicodin  Home Medications   Prior to Admission medications   Medication Sig Start Date End Date Taking? Authorizing Provider  acyclovir (ZOVIRAX) 200 MG capsule Take 200 mg by mouth 2 (two) times daily.   Yes Historical Provider, MD  albuterol (PROVENTIL HFA;VENTOLIN HFA) 108 (90 BASE) MCG/ACT inhaler Inhale 2 puffs into the lungs every 6 (six) hours as needed for wheezing. 12/01/14  Yes Camelia Eng Tysinger, PA-C  albuterol (PROVENTIL) (2.5 MG/3ML) 0.083% nebulizer solution Take 3 mLs (2.5 mg total) by nebulization every 6 (six) hours as needed for wheezing or shortness of breath. 12/01/14  Yes Camelia Eng Tysinger, PA-C  amLODipine (NORVASC) 5 MG tablet Take 1 tablet (5 mg total) by mouth daily. 12/01/14  Yes Camelia Eng Tysinger, PA-C  aspirin EC 81 MG tablet Take 1 tablet (81 mg total) by mouth daily. 12/01/14  Yes Carlena Hurl, PA-C  atorvastatin (LIPITOR) 10 MG tablet Take 1 tablet (10 mg total) by mouth daily. 12/01/14  Yes Camelia Eng Tysinger, PA-C  Fluticasone-Salmeterol (ADVAIR) 250-50 MCG/DOSE AEPB Inhale 1 puff into the lungs 2 (two) times daily. 12/01/14  Yes Camelia Eng Tysinger, PA-C  lisinopril-hydrochlorothiazide (PRINZIDE,ZESTORETIC) 20-25 MG per tablet Take 1 tablet by mouth daily. 12/01/14  Yes Camelia Eng Tysinger, PA-C  metFORMIN (GLUCOPHAGE) 500 MG tablet Take 1 tablet (500 mg total) by  mouth daily with breakfast. 12/01/14  Yes Camelia Eng Tysinger, PA-C  benzonatate (TESSALON) 100 MG capsule Take 1 capsule (100 mg total) by mouth every 8 (eight) hours. 03/19/15   Dahlia Bailiff, PA-C  methylPREDNIsolone (MEDROL DOSPACK) 4 MG tablet follow package directions Patient not taking: Reported on 03/19/2015 12/26/14   Camelia Eng Tysinger, PA-C  predniSONE (DELTASONE) 20 MG tablet Take 1 tablet (20 mg total) by mouth 2 (two) times daily with a meal. 03/19/15   Dahlia Bailiff, PA-C   BP 145/85 mmHg  Pulse 85  Temp(Src) 97.9 F (36.6 C) (Oral)  Resp 20  SpO2 96%  LMP 03/13/2015 (Approximate) Physical Exam  Constitutional: She is oriented to person, place, and time. She appears well-developed and well-nourished. No distress.  HENT:  Head: Normocephalic and atraumatic.  Mouth/Throat: Oropharynx is clear and moist. No oropharyngeal exudate.  Eyes: Right eye exhibits no discharge. Left eye exhibits no discharge. No scleral icterus.  Neck: Normal range of motion.  Cardiovascular: Normal rate, regular rhythm, S1 normal, S2 normal and normal heart sounds.   No murmur heard. Pulmonary/Chest: Effort normal. No accessory muscle usage. Tachypnea noted. No respiratory distress. She has wheezes in the right upper field, the right middle field, the left upper field and the left middle field. She has no rhonchi. She has no rales.  Abdominal: Soft. Normal appearance. There is no tenderness.  Musculoskeletal: Normal range of motion. She exhibits no edema or tenderness.  Neurological: She is alert and oriented to person, place, and time. No cranial nerve deficit. Coordination normal.  Skin: Skin is warm and dry. No rash noted. She is not diaphoretic.  Psychiatric: She has a normal mood and affect.  Nursing note and vitals reviewed.   ED Course  Procedures (including critical care time) Labs Review Labs Reviewed  CBC - Abnormal; Notable for the following:    RDW 16.4 (*)    All other components within normal  limits  BASIC METABOLIC PANEL - Abnormal; Notable for the following:    Potassium 3.3 (*)    Chloride 99 (*)    Calcium 8.7 (*)    All other components within normal limits  TROPONIN I    Imaging Review Dg Chest 2 View (if Patient Has Fever And/or Copd)  03/19/2015   CLINICAL DATA:  Chest pain. Productive cough since yesterday. Bilateral expiratory wheezes. Smoker.  EXAM: CHEST  2 VIEW  COMPARISON:  11/02/2014.  FINDINGS: Normal sized heart. Clear lungs. Mild central peribronchial thickening without significant change. Mild thoracic spine degenerative changes.  IMPRESSION: No acute abnormality.  Stable mild chronic bronchitic changes.   Electronically Signed   By: Claudie Revering M.D.   On: 03/19/2015 10:33     EKG Interpretation   Date/Time:  Sunday Mar 19 2015 10:05:01 EDT Ventricular Rate:  95 PR Interval:  173 QRS Duration: 88 QT Interval:  354 QTC Calculation: 445 R Axis:   10 Text Interpretation:  Sinus rhythm Low voltage, precordial leads Probable  anteroseptal infarct, old Baseline wander in lead(s) V3 No significant  change since last tracing Confirmed by DOCHERTY  MD, [place] [phone]) on  03/19/2015 10:10:15 AM      MDM   Final diagnoses:  Asthma exacerbation  Chest wall pain    Patient ambulated in ED with O2 saturations maintained >95 S dyspnea, no current signs of respiratory distress. Lung exam improved after nebulizer treatment. Prednisone given in the ED and pt will bd dc with 5 day burst. Pt states they are breathing at baseline. Chest discomfort pleuritic in nature, only present with deep inspiration and coughing. EKG without evidence of acute abnormalities. After breathing treatment and Motrin patient's symptoms have decreased significantly. Troponin negative. No concern for ACS with pleuritic nature of pain, likely musculoskeletal. No concern for PE. Patient has no risk factors for DVT/PE, initially mildly tachycardic at 102, this improved after breathing treatment,  likely tachycardia from bronchospasm. Chest wall tenderness and tightness improved as well after breathing treatment and Motrin here. Chest x-ray without evidence of acute abnormalities. No concern for pneumonia. Low potassium discussed with patient, she states this is abnormal with her. I offered to give patient prescription for potassium tabs, patient states she would prefer to eat foods high in potassium, patient is not vomiting or having diarrhea, feel this is reasonable at this point. I discussed return precautions with patient, and strongly encouraged her to follow-up with her PCP. Patient verbalizes understanding and agreement of this plan.  BP 145/85 mmHg  Pulse 85  Temp(Src) 97.9 F (36.6 C) (Oral)  Resp 20  SpO2 96%  LMP 03/13/2015 (Approximate)  Signed,  Dahlia Bailiff, PA-C 4:43 PM  Patient discussed with Dr. Pamella Pert, M.D.   Dahlia Bailiff, PA-C 03/19/15 [place], MD 03/20/15 [phone]

## 2015-03-19 NOTE — Discharge Instructions (Signed)
Asthma, Acute Bronchospasm °Acute bronchospasm caused by asthma is also referred to as an asthma attack. Bronchospasm means your air passages become narrowed. The narrowing is caused by inflammation and tightening of the muscles in the air tubes (bronchi) in your lungs. This can make it hard to breathe or cause you to wheeze and cough. °CAUSES °Possible triggers are: °· Animal dander from the skin, hair, or feathers of animals. °· Dust mites contained in house dust. °· Cockroaches. °· Pollen from trees or grass. °· Mold. °· Cigarette or tobacco smoke. °· Air pollutants such as dust, household cleaners, hair sprays, aerosol sprays, paint fumes, strong chemicals, or strong odors. °· Cold air or weather changes. Cold air may trigger inflammation. Winds increase molds and pollens in the air. °· Strong emotions such as crying or laughing hard. °· Stress. °· Certain medicines such as aspirin or beta-blockers. °· Sulfites in foods and drinks, such as dried fruits and wine. °· Infections or inflammatory conditions, such as a flu, cold, or inflammation of the nasal membranes (rhinitis). °· Gastroesophageal reflux disease (GERD). GERD is a condition where stomach acid backs up into your esophagus. °· Exercise or strenuous activity. °SIGNS AND SYMPTOMS  °· Wheezing. °· Excessive coughing, particularly at night. °· Chest tightness. °· Shortness of breath. °DIAGNOSIS  °Your health care provider will ask you about your medical history and perform a physical exam. A chest X-ray or blood testing may be performed to look for other causes of your symptoms or other conditions that may have triggered your asthma attack.  °TREATMENT  °Treatment is aimed at reducing inflammation and opening up the airways in your lungs.  Most asthma attacks are treated with inhaled medicines. These include quick relief or rescue medicines (such as bronchodilators) and controller medicines (such as inhaled corticosteroids). These medicines are sometimes  given through an inhaler or a nebulizer. Systemic steroid medicine taken by mouth or given through an IV tube also can be used to reduce the inflammation when an attack is moderate or severe. Antibiotic medicines are only used if a bacterial infection is present.  °HOME CARE INSTRUCTIONS  °· Rest. °· Drink plenty of liquids. This helps the mucus to remain thin and be easily coughed up. Only use caffeine in moderation and do not use alcohol until you have recovered from your illness. °· Do not smoke. Avoid being exposed to secondhand smoke. °· You play a critical role in keeping yourself in good health. Avoid exposure to things that cause you to wheeze or to have breathing problems. °· Keep your medicines up-to-date and available. Carefully follow your health care provider's treatment plan. °· Take your medicine exactly as prescribed. °· When pollen or pollution is bad, keep windows closed and use an air conditioner or go to places with air conditioning. °· Asthma requires careful medical care. See your health care provider for a follow-up as advised. If you are more than [redacted] weeks pregnant and you were prescribed any new medicines, let your obstetrician know about the visit and how you are doing. Follow up with your health care provider as directed. °· After you have recovered from your asthma attack, make an appointment with your outpatient doctor to talk about ways to reduce the likelihood of future attacks. If you do not have a doctor who manages your asthma, make an appointment with a primary care doctor to discuss your asthma. °SEEK IMMEDIATE MEDICAL CARE IF:  °· You are getting worse. °· You have trouble breathing. If severe, call your local   emergency services (911 in the [place]  You develop chest pain or discomfort.  You are vomiting.  You are not able to keep fluids down.  You are coughing up yellow, green, brown, or bloody sputum.  You have a fever and your symptoms suddenly get worse.  You have  trouble swallowing. MAKE SURE YOU:   Understand these instructions.  Will watch your condition.  Will get help right away if you are not doing well or get worse. Document Released: 01/29/2007 Document Revised: 10/19/2013 Document Reviewed: 04/21/2013 [facility] Patient Information 2015 [place], [place]. This information is not intended to replace advice given to you by your health care provider. Make sure you discuss any questions you have with your health care provider.  Chest Wall Pain Chest wall pain is pain in or around the bones and muscles of your chest. It may take up to 6 weeks to get better. It may take longer if you must stay physically active in your work and activities.  CAUSES  Chest wall pain may happen on its own. However, it may be caused by:  A viral illness like the flu.  Injury.  Coughing.  Exercise.  Arthritis.  Fibromyalgia.  Shingles. HOME CARE INSTRUCTIONS   Avoid overtiring physical activity. Try not to strain or perform activities that cause pain. This includes any activities using your chest or your abdominal and side muscles, especially if heavy weights are used.  Put ice on the sore area.  Put ice in a plastic bag.  Place a towel between your skin and the bag.  Leave the ice on for 15-20 minutes per hour while awake for the first 2 days.  Only take over-the-counter or prescription medicines for pain, discomfort, or fever as directed by your caregiver. SEEK IMMEDIATE MEDICAL CARE IF:   Your pain increases, or you are very uncomfortable.  You have a fever.  Your chest pain becomes worse.  You have new, unexplained symptoms.  You have nausea or vomiting.  You feel sweaty or lightheaded.  You have a cough with phlegm (sputum), or you cough up blood. MAKE SURE YOU:   Understand these instructions.  Will watch your condition.  Will get help right away if you are not doing well or get worse. Document Released: 10/14/2005 Document  Revised: 01/06/2012 Document Reviewed: 06/10/2011 [facility] Patient Information 2015 [place], [place]. This information is not intended to replace advice given to you by your health care provider. Make sure you discuss any questions you have with your health care provider.

## 2015-04-03 ENCOUNTER — Encounter (HOSPITAL_COMMUNITY): Payer: Self-pay | Admitting: Emergency Medicine

## 2015-04-03 ENCOUNTER — Emergency Department (HOSPITAL_COMMUNITY)
Admission: EM | Admit: 2015-04-03 | Discharge: 2015-04-03 | Disposition: A | Discharge status: Home / Self Care | Payer: BLUE CROSS/BLUE SHIELD | Attending: Emergency Medicine | Admitting: Emergency Medicine

## 2015-04-03 DIAGNOSIS — E119 Type 2 diabetes mellitus without complications: Secondary | ICD-10-CM | POA: Insufficient documentation

## 2015-04-03 DIAGNOSIS — Z7952 Long term (current) use of systemic steroids: Secondary | ICD-10-CM | POA: Insufficient documentation

## 2015-04-03 DIAGNOSIS — E785 Hyperlipidemia, unspecified: Secondary | ICD-10-CM | POA: Diagnosis not present

## 2015-04-03 DIAGNOSIS — Z7982 Long term (current) use of aspirin: Secondary | ICD-10-CM | POA: Insufficient documentation

## 2015-04-03 DIAGNOSIS — Z7951 Long term (current) use of inhaled steroids: Secondary | ICD-10-CM | POA: Insufficient documentation

## 2015-04-03 DIAGNOSIS — I10 Essential (primary) hypertension: Secondary | ICD-10-CM | POA: Diagnosis not present

## 2015-04-03 DIAGNOSIS — J45909 Unspecified asthma, uncomplicated: Secondary | ICD-10-CM | POA: Diagnosis not present

## 2015-04-03 DIAGNOSIS — Z8719 Personal history of other diseases of the digestive system: Secondary | ICD-10-CM | POA: Diagnosis not present

## 2015-04-03 DIAGNOSIS — Z720 Tobacco use: Secondary | ICD-10-CM | POA: Diagnosis not present

## 2015-04-03 DIAGNOSIS — Z79899 Other long term (current) drug therapy: Secondary | ICD-10-CM | POA: Insufficient documentation

## 2015-04-03 DIAGNOSIS — R51 Headache: Secondary | ICD-10-CM

## 2015-04-03 DIAGNOSIS — R519 Headache, unspecified: Secondary | ICD-10-CM

## 2015-04-03 DIAGNOSIS — G43909 Migraine, unspecified, not intractable, without status migrainosus: Secondary | ICD-10-CM | POA: Diagnosis not present

## 2015-04-03 MED ORDER — METOCLOPRAMIDE HCL 5 MG/ML IJ SOLN
10.0000 mg | Freq: Once | INTRAMUSCULAR | Status: AC
  Administered 2015-04-03: 10 mg via INTRAMUSCULAR
  Filled 2015-04-03: qty 2

## 2015-04-03 MED ORDER — DIPHENHYDRAMINE HCL 50 MG/ML IJ SOLN
50.0000 mg | Freq: Once | INTRAMUSCULAR | Status: AC
  Administered 2015-04-03: 50 mg via INTRAMUSCULAR
  Filled 2015-04-03: qty 1

## 2015-04-03 NOTE — ED Notes (Addendum)
Patient c/o migraine x2 days. States she takes "advil migraine" without relief, last dose yesterday around noon. Patient denies vomiting, c/o nausea. Patient c/o light and audio sensitivity.

## 2015-04-03 NOTE — ED Notes (Signed)
Pt with Hx of migraines c/o migraine onset yesterday, some nausea present.

## 2015-04-03 NOTE — Discharge Instructions (Signed)
Analgesic Rebound Headaches °An analgesic rebound headache is a headache that returns after pain medicine (analgesic) that was taken to treat the initial headache wears off. People who suffer from tension, migraine, or cluster headaches are at risk for developing rebound headaches. Any type of primary headache can return as a rebound headache if you regularly take analgesics more than three times a week. If the cycle of rebound headaches continues, they become chronic daily headaches.  °CAUSES °Analgesics frequently associated with this problem include common over-the-counter medicines like aspirin, ibuprofen, acetaminophen, sinus relief medicines, and other medicines that contain caffeine.  Narcotic pain medicines are also a common cause of rebound headaches.  °SIGNS AND SYMPTOMS °The symptoms of rebound headaches are the same as the symptoms of your initial headache. Symptoms of specific types of headaches include: °Tension headache °· Pressure around the head. °· Dull, aching head pain. °· Pain felt over the front and sides of the head. °· Tenderness in the muscles of the head, neck and shoulders. °Migraine Headache °· Pulsing or throbbing pain on one or both sides of the head. °· Severe pain that interferes with daily activities. °· Pain that is worsened by physical activity. °· Nausea, vomiting, or both. °· Pain with exposure to bright light, loud noises, or strong smells. °· General sensitivity to bright light, loud noises, or strong smells. °· Visual changes. °· Numbness of one or both arms. °Cluster Headaches °· Severe pain that begins in or around one eye or temple. °· Redness in the eye on the same side as the pain. °· Droopy or swollen eyelid. °· One-sided head pain. °· Nausea. °· Runny nose. °· Sweaty, pale facial skin. °· Restlessness. °DIAGNOSIS  °Analgesic rebound headaches are diagnosed by reviewing your medical history. This includes the nature of your initial headaches, as well as the type of pain  medicines you have been using to treat your headaches and how often you take them. °TREATMENT °Discontinuing frequent use of the analgesic medicine will typically reduce the frequency of the rebound episodes. This may initially worsen your headaches but eventually the pain should become more manageable, less frequent, and less severe.  °Seeing a headache specialists may helpful. He or she may be able to help you manage your headaches and to make sure there is not another cause of the headaches. Alternative methods of stress relief such as acupuncture, counseling, biofeedback, and massage may also be helpful. Talk with your health care provider about which alternative treatments might be good for you. °HOME CARE INSTRUCTIONS °Stopping the regular use of pain medicine can be difficult. Follow your health care provider's instructions carefully. Keep all of your appointments. Avoid triggers that are known to cause your primary headaches. °SEEK MEDICAL CARE IF: °You continue to experience headaches after following your health care provider's recommended treatments. °SEEK IMMEDIATE MEDICAL CARE IF: °· You develop new headache pain. °· You develop headache pain that is different than what you have experienced in the past. °· You develop numbness or tingling in your arms or legs. °· You develop changes in your speech or vision. °MAKE SURE YOU: °· Understand these instructions. °· Will watch your child's condition. °· Will get help right away if your child is not doing well or gets worse. °Document Released: 01/04/2004 Document Revised: 02/28/2014 Document Reviewed: 04/29/2013 °[employer]® Patient Information ©2015 [employer]. This information is not intended to replace advice given to you by your health care provider. Make sure you discuss any questions you have with your health care   provider.  Migraine Headache A migraine headache is an intense, throbbing pain on one or both sides of your head. A migraine can last for  30 minutes to several hours. CAUSES  The exact cause of a migraine headache is not always known. However, a migraine may be caused when nerves in the brain become irritated and release chemicals that cause inflammation. This causes pain. Certain things may also trigger migraines, such as:  Alcohol.  Smoking.  Stress.  Menstruation.  Aged cheeses.  Foods or drinks that contain nitrates, glutamate, aspartame, or tyramine.  Lack of sleep.  Chocolate.  Caffeine.  Hunger.  Physical exertion.  Fatigue.  Medicines used to treat chest pain (nitroglycerine), birth control pills, estrogen, and some blood pressure medicines. SIGNS AND SYMPTOMS  Pain on one or both sides of your head.  Pulsating or throbbing pain.  Severe pain that prevents daily activities.  Pain that is aggravated by any physical activity.  Nausea, vomiting, or both.  Dizziness.  Pain with exposure to bright lights, loud noises, or activity.  General sensitivity to bright lights, loud noises, or smells. Before you get a migraine, you may get warning signs that a migraine is coming (aura). An aura may include:  Seeing flashing lights.  Seeing bright spots, halos, or zigzag lines.  Having tunnel vision or blurred vision.  Having feelings of numbness or tingling.  Having trouble talking.  Having muscle weakness. DIAGNOSIS  A migraine headache is often diagnosed based on:  Symptoms.  Physical exam.  A CT scan or MRI of your head. These imaging tests cannot diagnose migraines, but they can help rule out other causes of headaches. TREATMENT Medicines may be given for pain and nausea. Medicines can also be given to help prevent recurrent migraines.  HOME CARE INSTRUCTIONS  Only take over-the-counter or prescription medicines for pain or discomfort as directed by your health care provider. The use of long-term narcotics is not recommended.  Lie down in a dark, quiet room when you have a  migraine.  Keep a journal to find out what may trigger your migraine headaches. For example, write down:  What you eat and drink.  How much sleep you get.  Any change to your diet or medicines.  Limit alcohol consumption.  Quit smoking if you smoke.  Get 7-9 hours of sleep, or as recommended by your health care provider.  Limit stress.  Keep lights dim if bright lights bother you and make your migraines worse. SEEK IMMEDIATE MEDICAL CARE IF:   Your migraine becomes severe.  You have a fever.  You have a stiff neck.  You have vision loss.  You have muscular weakness or loss of muscle control.  You start losing your balance or have trouble walking.  You feel faint or pass out.  You have severe symptoms that are different from your first symptoms. MAKE SURE YOU:   Understand these instructions.  Will watch your condition.  Will get help right away if you are not doing well or get worse. Document Released: 10/14/2005 Document Revised: 02/28/2014 Document Reviewed: 06/21/2013 [facility] Patient Information 2015 [place], [place]. This information is not intended to replace advice given to you by your health care provider. Make sure you discuss any questions you have with your health care provider.

## 2015-04-03 NOTE — ED Provider Notes (Signed)
CSN: [id number]     Arrival date & time 04/03/15  9024 History   First MD Initiated Contact with Patient 04/03/15 0901     Chief Complaint  Patient presents with  . Migraine     (Consider location/radiation/quality/duration/timing/severity/associated sxs/prior Treatment) HPI   Sydney Williams is a 49 y.o. Female with history of headaches, HTN, DM, and asthma who presents to the [facility] ED with complaint of 2 days of gradually worsening headache.  It is located in the front of and on top of her head, has a pounding quality, is made worse with movement, light and noise, and improved with rest in a dark room.  She states that she has been working with a new PCP over the past 4 months and has decreased her use of ibuprofen to protect her kidney's.  She did take a "advil migraine" pill without any relief.  The last two days at work were hotter, which she reports seemed to bring on her headache, which gradually got worse, until today when she could not go to work due to the pain, pounding, mild nausea and photophobia.  She denies vision changes, numbness, weakness, and vomiting.  Past Medical History  Diagnosis Date  . Hypertension   . GERD (gastroesophageal reflux disease)   . Migraines   . Hyperlipidemia   . Diabetes mellitus     vs impaired fasting glucose  . Asthma     2015 hospitaliation for asthma   Past Surgical History  Procedure Laterality Date  . Tubal ligation    . Cyst excision      left neck/postauricular region, benign   Family History  Problem Relation Age of Onset  . Diabetes Mother   . Hypertension Mother   . Aneurysm Mother   . Stroke Mother   . Cancer Mother     cervical cancer  . Cancer Maternal Aunt     breast  . Heart disease Neg Hx   . Cancer Cousin     breast/breast   History  Substance Use Topics  . Smoking status: Current Every Day Smoker -- 0.25 packs/day for 10 years    Types: Cigarettes  . Smokeless tobacco: Never Used  . Alcohol Use: No   OB History     No data available     Review of Systems  Constitutional: Negative for fever, chills, diaphoresis and fatigue.  Eyes: Positive for photophobia and redness. Negative for pain and visual disturbance.  Respiratory: Negative.   Cardiovascular: Negative.   Musculoskeletal: Negative.   Skin: Negative.   Neurological: Positive for headaches. Negative for dizziness, tremors, seizures, syncope, facial asymmetry, speech difficulty, weakness, light-headedness and numbness.      Allergies  Pneumococcal vaccines and Vicodin  Home Medications   Prior to Admission medications   Medication Sig Start Date End Date Taking? Authorizing Provider  acyclovir (ZOVIRAX) 200 MG capsule Take 200 mg by mouth 2 (two) times daily.    Historical Provider, MD  albuterol (PROVENTIL HFA;VENTOLIN HFA) 108 (90 BASE) MCG/ACT inhaler Inhale 2 puffs into the lungs every 6 (six) hours as needed for wheezing. 12/01/14   Camelia Eng Tysinger, PA-C  albuterol (PROVENTIL) (2.5 MG/3ML) 0.083% nebulizer solution Take 3 mLs (2.5 mg total) by nebulization every 6 (six) hours as needed for wheezing or shortness of breath. 12/01/14   Camelia Eng Tysinger, PA-C  amLODipine (NORVASC) 5 MG tablet Take 1 tablet (5 mg total) by mouth daily. 12/01/14   Carlena Hurl, PA-C  aspirin EC 81 MG  tablet Take 1 tablet (81 mg total) by mouth daily. 12/01/14   Camelia Eng Tysinger, PA-C  atorvastatin (LIPITOR) 10 MG tablet Take 1 tablet (10 mg total) by mouth daily. 12/01/14   Camelia Eng Tysinger, PA-C  benzonatate (TESSALON) 100 MG capsule Take 1 capsule (100 mg total) by mouth every 8 (eight) hours. 03/19/15   Dahlia Bailiff, PA-C  Fluticasone-Salmeterol (ADVAIR) 250-50 MCG/DOSE AEPB Inhale 1 puff into the lungs 2 (two) times daily. 12/01/14   Camelia Eng Tysinger, PA-C  lisinopril-hydrochlorothiazide (PRINZIDE,ZESTORETIC) 20-25 MG per tablet Take 1 tablet by mouth daily. 12/01/14   Carlena Hurl, PA-C  metFORMIN (GLUCOPHAGE) 500 MG tablet Take 1 tablet (500 mg total) by mouth  daily with breakfast. 12/01/14   Carlena Hurl, PA-C  methylPREDNIsolone (MEDROL DOSPACK) 4 MG tablet follow package directions Patient not taking: Reported on 03/19/2015 12/26/14   Camelia Eng Tysinger, PA-C  predniSONE (DELTASONE) 20 MG tablet Take 1 tablet (20 mg total) by mouth 2 (two) times daily with a meal. 03/19/15   Dahlia Bailiff, PA-C   BP 146/85 mmHg  Pulse 101  Temp(Src) 98.1 F (36.7 C) (Oral)  Resp 16  SpO2 96%  LMP 03/13/2015 (Approximate) Physical Exam  Constitutional: Vital signs are normal. She appears well-developed and well-nourished. She is cooperative. She does not appear ill. No distress.  Laying in gurney in the dark, in no acute distress  HENT:  Head: Normocephalic and atraumatic.  Right Ear: External ear normal.  Left Ear: External ear normal.  Nose: Nose normal.  Mouth/Throat: Oropharynx is clear and moist. No oropharyngeal exudate.  Eyes: EOM are normal. Pupils are equal, round, and reactive to light. Right eye exhibits no chemosis, no discharge and no exudate. Left eye exhibits no chemosis, no discharge and no exudate. Right conjunctiva is injected. Left conjunctiva is injected.  Neck: Normal range of motion. Neck supple.  Cardiovascular: Normal rate, regular rhythm and normal heart sounds.  Exam reveals no gallop and no friction rub.   No murmur heard. Pulmonary/Chest: Breath sounds normal. No respiratory distress. She has no wheezes. She has no rales. She exhibits no tenderness.  Neurological: She is alert.  Skin: Skin is warm and dry. No rash noted. She is not diaphoretic. No erythema. No pallor.    ED Course  Procedures (including critical care time) Labs Review Labs Reviewed - No data to display  Imaging Review No results found.   EKG Interpretation None      MDM   Final diagnoses:  None    Pt presents for recurrent migraine headache, gradually worsening over the past two days, with associated photophobia, phonophobia, and nausea.  I do not  s  Will tx with reglan/benadryl, and avoid NSAIDS at this time. Will reevaluate after meds, but anticipate d/c home.  Delsa Grana, PA-C 9:23 AM  Pt's pain is improving and she has called her husband to pick her up.  Vitals reviewed.  Stable for discharge with follow-up with PCP  Delsa Grana, PA-C 10:49 AM   Delsa Grana, PA-C 04/07/15 0037  Sherwood Gambler, MD 04/08/15 [id number]

## 2015-04-07 ENCOUNTER — Emergency Department (HOSPITAL_COMMUNITY): Payer: BLUE CROSS/BLUE SHIELD

## 2015-04-07 ENCOUNTER — Emergency Department (HOSPITAL_COMMUNITY)
Admission: EM | Admit: 2015-04-07 | Discharge: 2015-04-07 | Disposition: A | Discharge status: Home / Self Care | Payer: BLUE CROSS/BLUE SHIELD | Attending: Emergency Medicine | Admitting: Emergency Medicine

## 2015-04-07 ENCOUNTER — Encounter (HOSPITAL_COMMUNITY): Payer: Self-pay

## 2015-04-07 DIAGNOSIS — Z720 Tobacco use: Secondary | ICD-10-CM | POA: Insufficient documentation

## 2015-04-07 DIAGNOSIS — Z8719 Personal history of other diseases of the digestive system: Secondary | ICD-10-CM | POA: Diagnosis not present

## 2015-04-07 DIAGNOSIS — J040 Acute laryngitis: Secondary | ICD-10-CM | POA: Diagnosis not present

## 2015-04-07 DIAGNOSIS — J45909 Unspecified asthma, uncomplicated: Secondary | ICD-10-CM | POA: Diagnosis not present

## 2015-04-07 DIAGNOSIS — J069 Acute upper respiratory infection, unspecified: Secondary | ICD-10-CM | POA: Diagnosis not present

## 2015-04-07 DIAGNOSIS — I10 Essential (primary) hypertension: Secondary | ICD-10-CM | POA: Diagnosis not present

## 2015-04-07 DIAGNOSIS — E785 Hyperlipidemia, unspecified: Secondary | ICD-10-CM | POA: Diagnosis not present

## 2015-04-07 DIAGNOSIS — Z79899 Other long term (current) drug therapy: Secondary | ICD-10-CM | POA: Insufficient documentation

## 2015-04-07 DIAGNOSIS — Z7982 Long term (current) use of aspirin: Secondary | ICD-10-CM | POA: Diagnosis not present

## 2015-04-07 DIAGNOSIS — Z7951 Long term (current) use of inhaled steroids: Secondary | ICD-10-CM | POA: Diagnosis not present

## 2015-04-07 DIAGNOSIS — J029 Acute pharyngitis, unspecified: Secondary | ICD-10-CM | POA: Diagnosis present

## 2015-04-07 DIAGNOSIS — E119 Type 2 diabetes mellitus without complications: Secondary | ICD-10-CM | POA: Insufficient documentation

## 2015-04-07 LAB — RAPID STREP SCREEN (MED CTR MEBANE ONLY): Streptococcus, Group A Screen (Direct): NEGATIVE

## 2015-04-07 MED ORDER — SALINE SPRAY 0.65 % NA SOLN
1.0000 | Freq: Once | NASAL | Status: AC
  Administered 2015-04-07: 1 via NASAL
  Filled 2015-04-07: qty 44

## 2015-04-07 MED ORDER — BENZONATATE 100 MG PO CAPS: 100.0000 mg | ORAL_CAPSULE | Freq: Three times a day (TID) | ORAL | Status: DC | PRN

## 2015-04-07 MED ORDER — FLUTICASONE PROPIONATE 50 MCG/ACT NA SUSP
1.0000 | Freq: Every day | NASAL | Status: DC
  Administered 2015-04-07: 1 via NASAL
  Filled 2015-04-07: qty 16

## 2015-04-07 MED ORDER — BENZONATATE 100 MG PO CAPS
100.0000 mg | ORAL_CAPSULE | Freq: Once | ORAL | Status: AC
  Administered 2015-04-07: 100 mg via ORAL
  Filled 2015-04-07: qty 1

## 2015-04-07 NOTE — Discharge Instructions (Signed)
Cough, Adult  A cough is a reflex that helps clear your throat and airways. It can help heal the body or may be a reaction to an irritated airway. A cough may only last 2 or 3 weeks (acute) or may last more than 8 weeks (chronic).  CAUSES Acute cough:  Viral or bacterial infections. Chronic cough:  Infections.  Allergies.  Asthma.  Post-nasal drip.  Smoking.  Heartburn or acid reflux.  Some medicines.  Chronic lung problems (COPD).  Cancer. SYMPTOMS   Cough.  Fever.  Chest pain.  Increased breathing rate.  High-pitched whistling sound when breathing (wheezing).  Colored mucus that you cough up (sputum). TREATMENT   A bacterial cough may be treated with antibiotic medicine.  A viral cough must run its course and will not respond to antibiotics.  Your caregiver may recommend other treatments if you have a chronic cough. HOME CARE INSTRUCTIONS   Only take over-the-counter or prescription medicines for pain, discomfort, or fever as directed by your caregiver. Use cough suppressants only as directed by your caregiver.  Use a cold steam vaporizer or humidifier in your bedroom or home to help loosen secretions.  Sleep in a semi-upright position if your cough is worse at night.  Rest as needed.  Stop smoking if you smoke. SEEK IMMEDIATE MEDICAL CARE IF:   You have pus in your sputum.  Your cough starts to worsen.  You cannot control your cough with suppressants and are losing sleep.  You begin coughing up blood.  You have difficulty breathing.  You develop pain which is getting worse or is uncontrolled with medicine.  You have a fever. MAKE SURE YOU:   Understand these instructions.  Will watch your condition.  Will get help right away if you are not doing well or get worse. Document Released: 04/12/2011 Document Revised: 01/06/2012 Document Reviewed: 04/12/2011 [facility] Patient Information 2015 [place], [place]. This information is not intended  to replace advice given to you by your health care provider. Make sure you discuss any questions you have with your health care provider.  Laryngitis At the top of your windpipe is your voice box. It is the source of your voice. Inside your voice box are 2 bands of muscles called vocal cords. When you breathe, your vocal cords are relaxed and open so that air can get into the lungs. When you decide to say something, these cords come together and vibrate. The sound from these vibrations goes into your throat and comes out through your mouth as sound. Laryngitis is an inflammation of the vocal cords that causes hoarseness, cough, loss of voice, sore throat, and dry throat. Laryngitis can be temporary (acute) or long-term (chronic). Most cases of acute laryngitis improve with time.Chronic laryngitis lasts for more than 3 weeks. CAUSES Laryngitis can often be related to excessive smoking, talking, or yelling, as well as inhalation of toxic fumes and allergies. Acute laryngitis is usually caused by a viral infection, vocal strain, measles or mumps, or bacterial infections. Chronic laryngitis is usually caused by vocal cord strain, vocal cord injury, postnasal drip, growths on the vocal cords, or acid reflux. SYMPTOMS   Cough.  Sore throat.  Dry throat. RISK FACTORS  Respiratory infections.  Exposure to irritating substances, such as cigarette smoke, excessive amounts of alcohol, stomach acids, and workplace chemicals.  Voice trauma, such as vocal cord injury from shouting or speaking too loud. DIAGNOSIS  Your cargiver will perform a physical exam. During the physical exam, your caregiver will examine  your throat. The most common sign of laryngitis is hoarseness. Laryngoscopy may be necessary to confirm the diagnosis of this condition. This procedure allows your caregiver to look into the larynx. HOME CARE INSTRUCTIONS  Drink enough fluids to keep your urine clear or pale yellow.  Rest until  you no longer have symptoms or as directed by your caregiver.  Breathe in moist air.  Take all medicine as directed by your caregiver.  Do not smoke.  Talk as little as possible (this includes whispering).  Write on paper instead of talking until your voice is back to normal.  Follow up with your caregiver if your condition has not improved after 10 days. SEEK MEDICAL CARE IF:   You have trouble breathing.  You cough up blood.  You have persistent fever.  You have increasing pain.  You have difficulty swallowing. MAKE SURE YOU:  Understand these instructions.  Will watch your condition.  Will get help right away if you are not doing well or get worse. Document Released: 10/14/2005 Document Revised: 01/06/2012 Document Reviewed: 12/20/2010 [facility] Patient Information 2015 [place], [place]. This information is not intended to replace advice given to you by your health care provider. Make sure you discuss any questions you have with your health care provider.

## 2015-04-07 NOTE — ED Notes (Signed)
Pt complains of a sore throat, a cough and congestion for three days, pt has almost lost her voice

## 2015-04-07 NOTE — ED Provider Notes (Signed)
CSN: [id number]     Arrival date & time 04/07/15  0508 History   First MD Initiated Contact with Patient 04/07/15 0532     Chief Complaint  Patient presents with  . Sore Throat     (Consider location/radiation/quality/duration/timing/severity/associated sxs/prior Treatment) HPI  This is a 49 year old female who presents with sore throat, cough, congestion and sinus pressure. Patient reports 3 days of symptoms. No known fevers. Patient reports sore throat with hoarse voice. Denies any difficulty swallowing. She reports cough productive of green sputum and rhinorrhea. She also reports sinus pressure. She is occasionally seen blood when blowing her nose. She is using Flonase at home. No known sick contacts. No shortness of breath or chest pain.  Past Medical History  Diagnosis Date  . Hypertension   . GERD (gastroesophageal reflux disease)   . Migraines   . Hyperlipidemia   . Diabetes mellitus     vs impaired fasting glucose  . Asthma     2015 hospitaliation for asthma   Past Surgical History  Procedure Laterality Date  . Tubal ligation    . Cyst excision      left neck/postauricular region, benign   Family History  Problem Relation Age of Onset  . Diabetes Mother   . Hypertension Mother   . Aneurysm Mother   . Stroke Mother   . Cancer Mother     cervical cancer  . Cancer Maternal Aunt     breast  . Heart disease Neg Hx   . Cancer Cousin     breast/breast   History  Substance Use Topics  . Smoking status: Current Every Day Smoker -- 0.25 packs/day for 10 years    Types: Cigarettes  . Smokeless tobacco: Never Used  . Alcohol Use: No   OB History    No data available     Review of Systems  Constitutional: Negative for fever.  HENT: Positive for sinus pressure and sore throat. Negative for ear pain and trouble swallowing.   Respiratory: Positive for cough. Negative for chest tightness and shortness of breath.   Cardiovascular: Negative for chest pain.   Gastrointestinal: Negative for nausea, vomiting and abdominal pain.  Genitourinary: Negative for dysuria.  Musculoskeletal: Negative for back pain.  Neurological: Negative for headaches.  All other systems reviewed and are negative.     Allergies  Pneumococcal vaccines and Vicodin  Home Medications   Prior to Admission medications   Medication Sig Start Date End Date Taking? Authorizing Provider  acyclovir (ZOVIRAX) 200 MG capsule Take 200 mg by mouth 2 (two) times daily.   Yes Historical Provider, MD  albuterol (PROVENTIL HFA;VENTOLIN HFA) 108 (90 BASE) MCG/ACT inhaler Inhale 2 puffs into the lungs every 6 (six) hours as needed for wheezing. 12/01/14  Yes Camelia Eng Tysinger, PA-C  albuterol (PROVENTIL) (2.5 MG/3ML) 0.083% nebulizer solution Take 3 mLs (2.5 mg total) by nebulization every 6 (six) hours as needed for wheezing or shortness of breath. 12/01/14  Yes Camelia Eng Tysinger, PA-C  amLODipine (NORVASC) 5 MG tablet Take 1 tablet (5 mg total) by mouth daily. 12/01/14  Yes Camelia Eng Tysinger, PA-C  aspirin EC 81 MG tablet Take 1 tablet (81 mg total) by mouth daily. 12/01/14  Yes Camelia Eng Tysinger, PA-C  atorvastatin (LIPITOR) 10 MG tablet Take 1 tablet (10 mg total) by mouth daily. 12/01/14  Yes Camelia Eng Tysinger, PA-C  Fluticasone-Salmeterol (ADVAIR) 250-50 MCG/DOSE AEPB Inhale 1 puff into the lungs 2 (two) times daily. 12/01/14  Yes Camelia Eng  Tysinger, PA-C  lisinopril-hydrochlorothiazide (PRINZIDE,ZESTORETIC) 20-25 MG per tablet Take 1 tablet by mouth daily. 12/01/14  Yes Camelia Eng Tysinger, PA-C  metFORMIN (GLUCOPHAGE) 500 MG tablet Take 1 tablet (500 mg total) by mouth daily with breakfast. 12/01/14  Yes Camelia Eng Tysinger, PA-C  benzonatate (TESSALON) 100 MG capsule Take 1 capsule (100 mg total) by mouth 3 (three) times daily as needed for cough. 04/07/15   Merryl Hacker, MD  methylPREDNIsolone (MEDROL DOSPACK) 4 MG tablet follow package directions Patient not taking: Reported on 03/19/2015 12/26/14   Camelia Eng Tysinger, PA-C  predniSONE (DELTASONE) 20 MG tablet Take 1 tablet (20 mg total) by mouth 2 (two) times daily with a meal. Patient not taking: Reported on 04/07/2015 03/19/15   Dahlia Bailiff, PA-C   BP 140/83 mmHg  Pulse 89  Temp(Src) 97.8 F (36.6 C) (Oral)  Resp 20  SpO2 98%  LMP 03/13/2015 (Approximate) Physical Exam  Constitutional: She is oriented to person, place, and time. She appears well-developed and well-nourished. No distress.  Raspy voice  HENT:  Head: Normocephalic and atraumatic.  Mouth/Throat: Oropharynx is clear and moist. No oropharyngeal exudate.  Uvula midline, no asymmetric swelling, swollen nasal turbinates  Eyes: Pupils are equal, round, and reactive to light.  Neck: Normal range of motion. Neck supple.  Cardiovascular: Normal rate, regular rhythm and normal heart sounds.   No murmur heard. Pulmonary/Chest: Effort normal and breath sounds normal. No respiratory distress. She has no wheezes.  Abdominal: Soft. There is no tenderness.  Musculoskeletal: She exhibits no edema.  Neurological: She is alert and oriented to person, place, and time.  Skin: Skin is warm and dry.  Psychiatric: She has a normal mood and affect.  Nursing note and vitals reviewed.   ED Course  Procedures (including critical care time) Labs Review Labs Reviewed  RAPID STREP SCREEN (NOT AT [facility])  CULTURE, GROUP A STREP    Imaging Review Dg Chest 2 View  04/07/2015   CLINICAL DATA:  Cough congestion and sore throat, worsening over the past 3 days.  EXAM: CHEST  2 VIEW  COMPARISON:  03/19/2015  FINDINGS: The heart size and mediastinal contours are within normal limits. Both lungs are clear. The visualized skeletal structures are unremarkable.  IMPRESSION: No active cardiopulmonary disease.   Electronically Signed   By: Andreas Newport M.D.   On: 04/07/2015 06:20     EKG Interpretation None      MDM   Final diagnoses:  Upper respiratory infection  Laryngitis    Patient presents  with upper respiratory symptoms including sore throat, sinus pain, and cough. Neck afebrile. Nontoxic on exam.  Good range of motion of the neck. No signs or symptoms of the space infection. Lung sounds are clear. Patient given Tessalon Perles, nasal saline, and Flonase. Strep screen negative and chest x-ray clear. Patient will be discharged home with supportive care. Suspect viral illness.  After history, exam, and medical workup I feel the patient has been appropriately medically screened and is safe for discharge home. Pertinent diagnoses were discussed with the patient. Patient was given return precautions.     Merryl Hacker, MD 04/07/15 2256

## 2015-04-10 ENCOUNTER — Telehealth: Payer: Self-pay | Admitting: Family Medicine

## 2015-04-10 LAB — CULTURE, GROUP A STREP: Strep A Culture: NEGATIVE

## 2015-04-10 NOTE — Telephone Encounter (Signed)
Called pt to find out why she went to ER instead of coming here. No answer.

## 2015-04-12 ENCOUNTER — Ambulatory Visit (INDEPENDENT_AMBULATORY_CARE_PROVIDER_SITE_OTHER): Payer: BLUE CROSS/BLUE SHIELD | Admitting: Medical

## 2015-04-12 ENCOUNTER — Encounter: Payer: Self-pay | Admitting: Medical

## 2015-04-12 VITALS — BP 112/68 | HR 88 | Temp 98.0°F | Wt 260.0 lb

## 2015-04-12 DIAGNOSIS — Z720 Tobacco use: Secondary | ICD-10-CM

## 2015-04-12 DIAGNOSIS — J4540 Moderate persistent asthma, uncomplicated: Secondary | ICD-10-CM

## 2015-04-12 DIAGNOSIS — F17200 Nicotine dependence, unspecified, uncomplicated: Secondary | ICD-10-CM

## 2015-04-12 MED ORDER — BENZONATATE 200 MG PO CAPS: 200.0000 mg | ORAL_CAPSULE | Freq: Three times a day (TID) | ORAL | Status: DC | PRN

## 2015-04-12 MED ORDER — AZITHROMYCIN 250 MG PO TABS: ORAL_TABLET | ORAL | Status: DC

## 2015-04-12 MED ORDER — METHYLPREDNISOLONE 4 MG PO TABS: ORAL_TABLET | ORAL | Status: DC

## 2015-04-12 MED ORDER — FLUTICASONE FUROATE-VILANTEROL 200-25 MCG/INH IN AEPB: 1.0000 | INHALATION_SPRAY | Freq: Every day | RESPIRATORY_TRACT | Status: DC

## 2015-04-12 NOTE — Progress Notes (Signed)
Subjective:  Sydney Williams is a 49 y.o. female who presents for emergency dept f/u.  Was seen recently twice in the ED for asthma flare.   Was given tessalon perles, 100mg , advised to c/t Advair, albuterol prn, rest, and not improving . Has symptoms ongoing or over 1.5 week now including cough, frequent cough, cough not improved, sore throat, drainage in back of th rout, upper abdomen hurts from all the coughing.    No fever, no wheezing, no SOB, no ear pain, no sinus pressure, no rash, no NVD, no chest pain, no palpations or edema.  No other aggravating or relieving factors. No other complaint.  The following portions of the patient's history were reviewed and updated as appropriate: allergies, current medications, past family history, past medical history, past social history, past surgical history and problem list.  ROS as in subjective  Past Medical History  Diagnosis Date  . Hypertension   . GERD (gastroesophageal reflux disease)   . Migraines   . Hyperlipidemia   . Diabetes mellitus     vs impaired fasting glucose  . Asthma     2015 hospitaliation for asthma     Objective: BP 112/68 mmHg  Pulse 88  Temp(Src) 98 F (36.7 C) (Oral)  Wt 260 lb (117.935 kg)  SpO2 96%  LMP 03/13/2015 (Approximate)  General appearance: Alert, WD/WN, no distress, hoarse voice, somewhat ill appearing                             Skin: warm, no rash, no diaphoresis                           Head: no sinus tenderness                            Eyes: conjunctiva normal, corneas clear, PERRLA                            Ears: pearly TMs, external ear canals normal                          Nose: septum midline, turbinates swollen, with erythema and clear discharge             Mouth/throat: MMM, tongue normal, mild pharyngeal erythema                           Neck: supple, no adenopathy, no thyromegaly, nontender                          Heart: RRR, normal S1, S2, no murmurs                         Lungs:  +bronchial breath sounds, no rhonchi, no wheezes, no rales                Extremities: no edema, nontender     Assessment: Encounter Diagnoses  Name Primary?  Marland Kitchen Asthmatic bronchitis, moderate persistent, uncomplicated Yes  . Smoker     Plan:  Reviewed 2 recent ED reports, chest xray,labs.   At this point not much improved.  Begin Medrol Dosepak, zpak antibiotic, increased dose of Tessalon Perles, c/t albuterol prn either  HFA or nebs, c/t Advair.  Hydrate well, rest.  If not much improved in the next few days call back.  Begin sample of Breo inhaler instead of Advair in 2 wk if back to baseline.  See if works any better or better tolerated.   Given that this is 3rd steroid taper for asthma since 10/2014, we need to try a different regimen.  Advised she recheck in 2-3 wk when at baseline to do spirometry and advised no albuterol rescue that morning.   Needs to certainly stop tobacco which she is still doing!  [name] was seen today for follow-up and cough, latyngitis, throat hurts.  Diagnoses and all orders for this visit:  Asthmatic bronchitis, moderate persistent, uncomplicated  Smoker  Other orders -     azithromycin (ZITHROMAX) 250 MG tablet; 2 tablets day 1, then 1 tablet days 2-4 -     methylPREDNISolone (MEDROL) 4 MG tablet; 6/5/4/3/2/1 taper -     benzonatate (TESSALON) 200 MG capsule; Take 1 capsule (200 mg total) by mouth 3 (three) times daily as needed for cough. -     Fluticasone Furoate-Vilanterol (BREO ELLIPTA) 200-25 MCG/INH AEPB; Inhale 1 puff into the lungs daily.

## 2015-06-01 ENCOUNTER — Encounter: Payer: Self-pay | Admitting: Medical

## 2015-06-01 ENCOUNTER — Ambulatory Visit (INDEPENDENT_AMBULATORY_CARE_PROVIDER_SITE_OTHER): Payer: BLUE CROSS/BLUE SHIELD | Admitting: Medical

## 2015-06-01 VITALS — BP 110/80 | HR 84 | Temp 97.9°F | Wt 261.0 lb

## 2015-06-01 DIAGNOSIS — K219 Gastro-esophageal reflux disease without esophagitis: Secondary | ICD-10-CM

## 2015-06-01 DIAGNOSIS — J309 Allergic rhinitis, unspecified: Secondary | ICD-10-CM

## 2015-06-01 DIAGNOSIS — J4540 Moderate persistent asthma, uncomplicated: Secondary | ICD-10-CM

## 2015-06-01 DIAGNOSIS — J3089 Other allergic rhinitis: Secondary | ICD-10-CM | POA: Insufficient documentation

## 2015-06-01 MED ORDER — ACYCLOVIR 200 MG PO CAPS: 200.0000 mg | ORAL_CAPSULE | Freq: Two times a day (BID) | ORAL | Status: DC

## 2015-06-01 MED ORDER — OMEPRAZOLE 40 MG PO CPDR: 40.0000 mg | DELAYED_RELEASE_CAPSULE | Freq: Every day | ORAL | Status: DC

## 2015-06-01 MED ORDER — FLUTICASONE FUROATE-VILANTEROL 200-25 MCG/INH IN AEPB: 1.0000 | INHALATION_SPRAY | Freq: Every day | RESPIRATORY_TRACT | Status: DC

## 2015-06-01 MED ORDER — MONTELUKAST SODIUM 10 MG PO TABS: 10.0000 mg | ORAL_TABLET | Freq: Every day | ORAL | Status: DC

## 2015-06-01 MED ORDER — ALBUTEROL SULFATE HFA 108 (90 BASE) MCG/ACT IN AERS: 2.0000 | INHALATION_SPRAY | Freq: Four times a day (QID) | RESPIRATORY_TRACT | Status: DC | PRN

## 2015-06-01 NOTE — Addendum Note (Signed)
Addended by: Carlena Hurl on: 06/01/2015 04:37 PM   Modules accepted: Orders, Medications

## 2015-06-01 NOTE — Progress Notes (Signed)
Subjective: Here for asthma f/u.  Since last visit in mid June, has been using albuterol about once daily.   Used the [employer], ran out and went back to Advair.  doesn't really tell a difference.  From 11/2014 til May, was using albuterol 2-3 times daily States triggers are carpet cleaner sprayed at work triggers asthma, and environmental allergies seem to trigger her asthma.  No pets.   Does have acid reflux issues.    Using OTC GERD medication daily, generic [facility] brand.     Using zyrtec generic daily currently, uses this year round. Has never seen asthma specialist.  Only 1 hospitalization for asthma prior.   She continues to smoke although she says she is cutting back. No other aggravating or relieving factors. No other complaint.  Past Medical History  Diagnosis Date  . Hypertension   . GERD (gastroesophageal reflux disease)   . Migraines   . Hyperlipidemia   . Diabetes mellitus     vs impaired fasting glucose  . Asthma     2015 hospitaliation for asthma   ROS as in subjective otherwise  Objective: BP 110/80 mmHg  Pulse 84  Temp(Src) 97.9 F (36.6 C) (Oral)  Wt 261 lb (118.389 kg)  General appearance: alert, no distress, WD/WN HEENT: normocephalic, sclerae anicteric, TMs pearly, nares patent, no discharge or erythema, pharynx normal Oral cavity: MMM, no lesions Neck: supple, no lymphadenopathy, no thyromegaly, no masses Heart: RRR, normal S1, S2, no murmurs Lungs: CTA bilaterally, no wheezes, rhonchi, or rales    Assessment: Encounter Diagnoses  Name Primary?  . Moderate persistent asthma, uncomplicated Yes  . Allergic rhinitis, unspecified allergic rhinitis type   . Gastroesophageal reflux disease without esophagitis     Plan: discussed her symptoms, medication regimen, recommendations today as asthma is improved but not controlled.  Interestingly the PFTs today shows mild restrictive pattern.  Questionable results.   Plan PFT at subsequent visit.    Patient  Instructions   Encounter Diagnoses  Name Primary?  . Moderate persistent asthma, uncomplicated Yes  . Allergic rhinitis, unspecified allergic rhinitis type   . Gastroesophageal reflux disease without esophagitis     Recommendations:  Asthma Begin Singulair daily at bedtime for allergies and asthma prevention Continue the OTC Zyrtec you are already using at bedtime Begin Breo PREVENTATIVE inhaler 1 puff daily in the morning Continue Albuterol RESCUE inhaler as needed.  GERD Begin prescription Omeprazole once daily in the morning 45 minute prior to breakfast for GERD and asthma prevention  Recheck in 3 months.  However, if you are still having to use the albuterol >4 times a week by mid September then call or recheck.       Kayleann was seen today for follow up on asthma and medication refill.  Diagnoses and all orders for this visit:  Moderate persistent asthma, uncomplicated Orders: -     Cancel: Spirometry with graph; Future -     Cancel: Spirometry with Graph; Future -     Spirometry with Graph; Standing -     Spirometry with Graph -     Fluticasone Furoate-Vilanterol (BREO ELLIPTA) 200-25 MCG/INH AEPB; Inhale 1 puff into the lungs daily. -     montelukast (SINGULAIR) 10 MG tablet; Take 1 tablet (10 mg total) by mouth at bedtime. -     albuterol (PROVENTIL HFA;VENTOLIN HFA) 108 (90 BASE) MCG/ACT inhaler; Inhale 2 puffs into the lungs every 6 (six) hours as needed for wheezing.  Allergic rhinitis, unspecified allergic rhinitis  type Orders: -     Fluticasone Furoate-Vilanterol (BREO ELLIPTA) 200-25 MCG/INH AEPB; Inhale 1 puff into the lungs daily. -     montelukast (SINGULAIR) 10 MG tablet; Take 1 tablet (10 mg total) by mouth at bedtime. -     albuterol (PROVENTIL HFA;VENTOLIN HFA) 108 (90 BASE) MCG/ACT inhaler; Inhale 2 puffs into the lungs every 6 (six) hours as needed for wheezing.  Gastroesophageal reflux disease without esophagitis Orders: -     omeprazole (PRILOSEC)  40 MG capsule; Take 1 capsule (40 mg total) by mouth daily.

## 2015-06-01 NOTE — Patient Instructions (Signed)
Encounter Diagnoses  Name Primary?  . Moderate persistent asthma, uncomplicated Yes  . Allergic rhinitis, unspecified allergic rhinitis type   . Gastroesophageal reflux disease without esophagitis     Recommendations:  Asthma Begin Singulair daily at bedtime for allergies and asthma prevention Continue the OTC Zyrtec you are already using at bedtime Begin Breo PREVENTATIVE inhaler 1 puff daily in the morning Continue Albuterol RESCUE inhaler as needed.  GERD Begin prescription Omeprazole once daily in the morning 45 minute prior to breakfast for GERD and asthma prevention  Recheck in 3 months.  However, if you are still having to use the albuterol >4 times a week by mid September then call or recheck.

## 2015-07-24 ENCOUNTER — Encounter (HOSPITAL_COMMUNITY): Payer: Self-pay | Admitting: Obstetrics and Gynecology

## 2015-07-24 ENCOUNTER — Inpatient Hospital Stay (HOSPITAL_COMMUNITY): Payer: BLUE CROSS/BLUE SHIELD

## 2015-07-24 ENCOUNTER — Inpatient Hospital Stay (HOSPITAL_COMMUNITY)
Admission: AD | Admit: 2015-07-24 | Discharge: 2015-07-24 | Disposition: A | Discharge status: Home / Self Care | Payer: BLUE CROSS/BLUE SHIELD | Source: Ambulatory Visit | Attending: Family Medicine | Admitting: Family Medicine

## 2015-07-24 DIAGNOSIS — R109 Unspecified abdominal pain: Secondary | ICD-10-CM | POA: Diagnosis present

## 2015-07-24 DIAGNOSIS — K219 Gastro-esophageal reflux disease without esophagitis: Secondary | ICD-10-CM | POA: Insufficient documentation

## 2015-07-24 DIAGNOSIS — I10 Essential (primary) hypertension: Secondary | ICD-10-CM | POA: Diagnosis not present

## 2015-07-24 DIAGNOSIS — R102 Pelvic and perineal pain: Secondary | ICD-10-CM | POA: Insufficient documentation

## 2015-07-24 LAB — URINALYSIS, ROUTINE W REFLEX MICROSCOPIC
Bilirubin Urine: NEGATIVE
Glucose, UA: NEGATIVE mg/dL
Ketones, ur: NEGATIVE mg/dL
Leukocytes, UA: NEGATIVE
Nitrite: NEGATIVE
Protein, ur: NEGATIVE mg/dL
Specific Gravity, Urine: 1.015 (ref 1.005–1.030)
Urobilinogen, UA: 0.2 mg/dL (ref 0.0–1.0)
pH: 7 (ref 5.0–8.0)

## 2015-07-24 LAB — URINE MICROSCOPIC-ADD ON

## 2015-07-24 LAB — CBC
HCT: 33.4 % — ABNORMAL LOW (ref 36.0–46.0)
Hemoglobin: 10.6 g/dL — ABNORMAL LOW (ref 12.0–15.0)
MCH: 26.4 pg (ref 26.0–34.0)
MCHC: 31.7 g/dL (ref 30.0–36.0)
MCV: 83.3 fL (ref 78.0–100.0)
Platelets: 247 10*3/uL (ref 150–400)
RBC: 4.01 MIL/uL (ref 3.87–5.11)
RDW: 19.2 % — ABNORMAL HIGH (ref 11.5–15.5)
WBC: 5.1 10*3/uL (ref 4.0–10.5)

## 2015-07-24 LAB — WET PREP, GENITAL
Trich, Wet Prep: NONE SEEN
WBC, Wet Prep HPF POC: NONE SEEN
Yeast Wet Prep HPF POC: NONE SEEN

## 2015-07-24 LAB — POCT PREGNANCY, URINE: Preg Test, Ur: NEGATIVE

## 2015-07-24 MED ORDER — KETOROLAC TROMETHAMINE 60 MG/2ML IM SOLN
60.0000 mg | Freq: Once | INTRAMUSCULAR | Status: AC
  Administered 2015-07-24: 60 mg via INTRAMUSCULAR
  Filled 2015-07-24: qty 2

## 2015-07-24 MED ORDER — OXYCODONE-ACETAMINOPHEN 5-325 MG PO TABS
2.0000 | ORAL_TABLET | Freq: Once | ORAL | Status: AC
  Administered 2015-07-24: 2 via ORAL
  Filled 2015-07-24: qty 2

## 2015-07-24 MED ORDER — IBUPROFEN 800 MG PO TABS: 800.0000 mg | ORAL_TABLET | Freq: Three times a day (TID) | ORAL | Status: DC | PRN

## 2015-07-24 NOTE — Discharge Instructions (Signed)
Pelvic Pain Female pelvic pain can be caused by many different things and start from a variety of places. Pelvic pain refers to pain that is located in the lower half of the abdomen and between your hips. The pain may occur over a short period of time (acute) or may be reoccurring (chronic). The cause of pelvic pain may be related to disorders affecting the female reproductive organs (gynecologic), but it may also be related to the bladder, kidney stones, an intestinal complication, or muscle or skeletal problems. Getting help right away for pelvic pain is important, especially if there has been severe, sharp, or a sudden onset of unusual pain. It is also important to get help right away because some types of pelvic pain can be life threatening.  CAUSES  Below are only some of the causes of pelvic pain. The causes of pelvic pain can be in one of several categories.   Gynecologic.  Pelvic inflammatory disease.  Sexually transmitted infection.  Ovarian cyst or a twisted ovarian ligament (ovarian torsion).  Uterine lining that grows outside the uterus (endometriosis).  Fibroids, cysts, or tumors.  Ovulation.  Pregnancy.  Pregnancy that occurs outside the uterus (ectopic pregnancy).  Miscarriage.  Labor.  Abruption of the placenta or ruptured uterus.  Infection.  Uterine infection (endometritis).  Bladder infection.  Diverticulitis.  Miscarriage related to a uterine infection (septic abortion).  Bladder.  Inflammation of the bladder (cystitis).  Kidney stone(s).  Gastrointestinal.  Constipation.  Diverticulitis.  Neurologic.  Trauma.  Feeling pelvic pain because of mental or emotional causes (psychosomatic).  Cancers of the bowel or pelvis. EVALUATION  Your caregiver will want to take a careful history of your concerns. This includes recent changes in your health, a careful gynecologic history of your periods (menses), and a sexual history. Obtaining your family  history and medical history is also important. Your caregiver may suggest a pelvic exam. A pelvic exam will help identify the location and severity of the pain. It also helps in the evaluation of which organ system may be involved. In order to identify the cause of the pelvic pain and be properly treated, your caregiver may order tests. These tests may include:   A pregnancy test.  Pelvic ultrasonography.  An X-ray exam of the abdomen.  A urinalysis or evaluation of vaginal discharge.  Blood tests. HOME CARE INSTRUCTIONS   Only take over-the-counter or prescription medicines for pain, discomfort, or fever as directed by your caregiver.   Rest as directed by your caregiver.   Eat a balanced diet.   Drink enough fluids to make your urine clear or pale yellow, or as directed.   Avoid sexual intercourse if it causes pain.   Apply warm or cold compresses to the lower abdomen depending on which one helps the pain.   Avoid stressful situations.   Keep a journal of your pelvic pain. Write down when it started, where the pain is located, and if there are things that seem to be associated with the pain, such as food or your menstrual cycle.  Follow up with your caregiver as directed.  SEEK MEDICAL CARE IF:  Your medicine does not help your pain.  You have abnormal vaginal discharge. SEEK IMMEDIATE MEDICAL CARE IF:   You have heavy bleeding from the vagina.   Your pelvic pain increases.   You feel light-headed or faint.   You have chills.   You have pain with urination or blood in your urine.   You have uncontrolled diarrhea   or vomiting.   You have a fever or persistent symptoms for more than 3 days.  You have a fever and your symptoms suddenly get worse.   You are being physically or sexually abused.  MAKE SURE YOU:  Understand these instructions.  Will watch your condition.  Will get help if you are not doing well or get worse. Document Released:  09/10/2004 Document Revised: 02/28/2014 Document Reviewed: 02/03/2012 Exit[employer] Patient Information 2015 [employer]. This information is not intended to replace advice given to you by your health care provider. Make sure you discuss any questions you have with your health care provider.  

## 2015-07-24 NOTE — MAU Provider Note (Signed)
History     CSN: [id number]  Arrival date and time: 07/24/15 1746   First Provider Initiated Contact with Patient 07/24/15 1832      Chief Complaint  Patient presents with  . Vaginal Bleeding  . Abdominal Pain   Abdominal Pain This is a new problem. The current episode started 1 to 4 weeks ago. The onset quality is gradual. The problem occurs constantly. The problem has been gradually worsening. The pain is located in the RLQ. The pain is at a severity of 8/10. The quality of the pain is cramping. Associated symptoms include nausea. Pertinent negatives include no fever or vomiting.    She has not taken anything for pain today; yesterday 800 mg Ibuprofen helped some.   This menstrual cycle started on September 1; which is the normal time for her period to start. She has been bleeding every day since then. She reports changing her peri pad every 1.5 hours.  She normally has regular periods, this is the first abnormal period she has had.   OB History    Gravida Para Term Preterm AB TAB SAB Ectopic Multiple Living   6 3 3  3  2 1  3       Past Medical History  Diagnosis Date  . Hypertension   . GERD (gastroesophageal reflux disease)   . Migraines   . Hyperlipidemia   . Diabetes mellitus     vs impaired fasting glucose  . Asthma     2015 hospitaliation for asthma    Past Surgical History  Procedure Laterality Date  . Tubal ligation    . Cyst excision      left neck/postauricular region, benign    Family History  Problem Relation Age of Onset  . Diabetes Mother   . Hypertension Mother   . Aneurysm Mother   . Stroke Mother   . Cancer Mother     cervical cancer  . Cancer Maternal Aunt     breast  . Heart disease Neg Hx   . Cancer Cousin     breast/breast    Social History  Substance Use Topics  . Smoking status: Current Every Day Smoker -- 0.25 packs/day for 10 years    Types: Cigarettes  . Smokeless tobacco: Never Used  . Alcohol Use: No    Allergies:   Allergies  Allergen Reactions  . Pneumococcal Vaccines Swelling    Swelling at injection site   . Vicodin [Hydrocodone-Acetaminophen] Rash    Prescriptions prior to admission  Medication Sig Dispense Refill Last Dose  . acyclovir (ZOVIRAX) 200 MG capsule Take 1 capsule (200 mg total) by mouth 2 (two) times daily. 60 capsule 1   . albuterol (PROVENTIL HFA;VENTOLIN HFA) 108 (90 BASE) MCG/ACT inhaler Inhale 2 puffs into the lungs every 6 (six) hours as needed for wheezing. 1 Inhaler 0   . albuterol (PROVENTIL) (2.5 MG/3ML) 0.083% nebulizer solution Take 3 mLs (2.5 mg total) by nebulization every 6 (six) hours as needed for wheezing or shortness of breath. 75 mL 1 Taking  . amLODipine (NORVASC) 5 MG tablet Take 1 tablet (5 mg total) by mouth daily. 90 tablet 3 Taking  . aspirin EC 81 MG tablet Take 1 tablet (81 mg total) by mouth daily. 90 tablet 3 Taking  . atorvastatin (LIPITOR) 10 MG tablet Take 1 tablet (10 mg total) by mouth daily. 90 tablet 3 Taking  . Fluticasone Furoate-Vilanterol (BREO ELLIPTA) 200-25 MCG/INH AEPB Inhale 1 puff into the lungs daily. 28 each 5   .  lisinopril-hydrochlorothiazide (PRINZIDE,ZESTORETIC) 20-25 MG per tablet Take 1 tablet by mouth daily. 90 tablet 3 Taking  . metFORMIN (GLUCOPHAGE) 500 MG tablet Take 1 tablet (500 mg total) by mouth daily with breakfast. 90 tablet 3 Taking  . montelukast (SINGULAIR) 10 MG tablet Take 1 tablet (10 mg total) by mouth at bedtime. 90 tablet 1   . omeprazole (PRILOSEC) 40 MG capsule Take 1 capsule (40 mg total) by mouth daily. 90 capsule 1    Results for orders placed or performed during the hospital encounter of 07/24/15 (from the past 48 hour(s))  Pregnancy, urine POC     Status: None   Collection Time: 07/24/15  6:39 PM  Result Value Ref Range   Preg Test, Ur NEGATIVE NEGATIVE    Comment:        THE SENSITIVITY OF THIS METHODOLOGY IS >24 mIU/mL   Urinalysis, Routine w reflex microscopic (not at [facility])     Status: Abnormal    Collection Time: 07/24/15  6:41 PM  Result Value Ref Range   Color, Urine YELLOW YELLOW   APPearance CLEAR CLEAR   Specific Gravity, Urine 1.015 1.005 - 1.030   pH 7.0 5.0 - 8.0   Glucose, UA NEGATIVE NEGATIVE mg/dL   Hgb urine dipstick SMALL (A) NEGATIVE   Bilirubin Urine NEGATIVE NEGATIVE   Ketones, ur NEGATIVE NEGATIVE mg/dL   Protein, ur NEGATIVE NEGATIVE mg/dL   Urobilinogen, UA 0.2 0.0 - 1.0 mg/dL   Nitrite NEGATIVE NEGATIVE   Leukocytes, UA NEGATIVE NEGATIVE  Urine microscopic-add on     Status: None   Collection Time: 07/24/15  6:41 PM  Result Value Ref Range   Squamous Epithelial / LPF RARE RARE   WBC, UA 0-2 <3 WBC/hpf   RBC / HPF 0-2 <3 RBC/hpf   Bacteria, UA RARE RARE  CBC     Status: Abnormal   Collection Time: 07/24/15  6:55 PM  Result Value Ref Range   WBC 5.1 4.0 - 10.5 K/uL   RBC 4.01 3.87 - 5.11 MIL/uL   Hemoglobin 10.6 (L) 12.0 - 15.0 g/dL   HCT 33.4 (L) 36.0 - 46.0 %   MCV 83.3 78.0 - 100.0 fL   MCH 26.4 26.0 - 34.0 pg   MCHC 31.7 30.0 - 36.0 g/dL   RDW 19.2 (H) 11.5 - 15.5 %   Platelets 247 150 - 400 K/uL  Wet prep, genital     Status: Abnormal   Collection Time: 07/24/15  7:25 PM  Result Value Ref Range   Yeast Wet Prep HPF POC NONE SEEN NONE SEEN   Trich, Wet Prep NONE SEEN NONE SEEN   Clue Cells Wet Prep HPF POC FEW (A) NONE SEEN   WBC, Wet Prep HPF POC NONE SEEN NONE SEEN    Comment: FEW BACTERIA SEEN    Review of Systems  Constitutional: Negative for fever and chills.  Gastrointestinal: Positive for nausea and abdominal pain. Negative for vomiting.  Neurological: Negative for dizziness and weakness.   Physical Exam   Blood pressure 151/96, pulse 87, temperature 98.2 F (36.8 C), temperature source Oral, resp. rate 18, last menstrual period 06/29/2015.  Physical Exam  Constitutional: She appears well-developed and well-nourished. No distress.  HENT:  Head: Normocephalic.  Eyes: Pupils are equal, round, and reactive to light.  Neck:  Neck supple.  Respiratory: Effort normal.  Genitourinary:  Speculum exam: Vagina - Small amount of creamy, pink discharge, mild odor Cervix - No contact bleeding Bimanual exam: Cervix closed Uterus tender, enlarged  Adnexa non tender, no masses bilaterally, + suprapubic tenderness  GC/Chlam, wet prep done Chaperone present for exam.  Musculoskeletal: Normal range of motion.  Neurological: She is alert.  Skin: Skin is warm. She is not diaphoretic.  Psychiatric: Her behavior is normal.    MAU Course  Procedures  None  MDM  Not much relief from Toradol; patient rates her pain 6/10 Percocet 2 tabs given  Anemic per CBC  Pelvic US complete ordered  Patient uncomfortable on exam despite Toradol > Pelvic US ordered   Report given to [place] who resumes care> patient awaiting [place].   Lezlie Lye, NP   Assessment and Plan   1. Pelvic pain in female   2. Abdominal pain    DC home Comfort measures reviewed  RX: ibuprofen 800mg  PRN #[id number]  Return to MAU as needed   Follow-up Information    Follow up with [facility].   Specialty:  Obstetrics and Gynecology   Why:  they will call you with an appointment    Contact information:   [address] [place] [phone]

## 2015-07-24 NOTE — MAU Note (Addendum)
Has been bleeding for over 21 days.  Pain  In RLQ, started 2 wks ago, has gotten stronger and stronger. Has been nauseated. Periods have been regular

## 2015-07-25 ENCOUNTER — Other Ambulatory Visit: Payer: Self-pay | Admitting: Medical

## 2015-08-09 ENCOUNTER — Ambulatory Visit: Payer: Self-pay | Admitting: Obstetrics & Gynecology

## 2015-08-23 ENCOUNTER — Other Ambulatory Visit: Payer: Self-pay | Admitting: Medical

## 2015-09-05 ENCOUNTER — Other Ambulatory Visit: Payer: Self-pay | Admitting: Medical

## 2015-09-06 ENCOUNTER — Ambulatory Visit: Payer: BLUE CROSS/BLUE SHIELD | Admitting: Medical

## 2015-09-11 DIAGNOSIS — K219 Gastro-esophageal reflux disease without esophagitis: Secondary | ICD-10-CM | POA: Insufficient documentation

## 2015-09-11 DIAGNOSIS — E785 Hyperlipidemia, unspecified: Secondary | ICD-10-CM | POA: Diagnosis not present

## 2015-09-11 DIAGNOSIS — Z7951 Long term (current) use of inhaled steroids: Secondary | ICD-10-CM | POA: Diagnosis not present

## 2015-09-11 DIAGNOSIS — F17210 Nicotine dependence, cigarettes, uncomplicated: Secondary | ICD-10-CM | POA: Diagnosis not present

## 2015-09-11 DIAGNOSIS — R112 Nausea with vomiting, unspecified: Secondary | ICD-10-CM | POA: Diagnosis present

## 2015-09-11 DIAGNOSIS — G43909 Migraine, unspecified, not intractable, without status migrainosus: Secondary | ICD-10-CM | POA: Diagnosis not present

## 2015-09-11 DIAGNOSIS — Z79899 Other long term (current) drug therapy: Secondary | ICD-10-CM | POA: Diagnosis not present

## 2015-09-11 DIAGNOSIS — J45909 Unspecified asthma, uncomplicated: Secondary | ICD-10-CM | POA: Insufficient documentation

## 2015-09-11 DIAGNOSIS — J029 Acute pharyngitis, unspecified: Secondary | ICD-10-CM | POA: Diagnosis not present

## 2015-09-11 DIAGNOSIS — E119 Type 2 diabetes mellitus without complications: Secondary | ICD-10-CM | POA: Insufficient documentation

## 2015-09-11 DIAGNOSIS — E669 Obesity, unspecified: Secondary | ICD-10-CM | POA: Diagnosis not present

## 2015-09-11 DIAGNOSIS — R05 Cough: Secondary | ICD-10-CM | POA: Diagnosis not present

## 2015-09-11 DIAGNOSIS — R6883 Chills (without fever): Secondary | ICD-10-CM | POA: Diagnosis not present

## 2015-09-11 DIAGNOSIS — I10 Essential (primary) hypertension: Secondary | ICD-10-CM | POA: Insufficient documentation

## 2015-09-11 DIAGNOSIS — Z7982 Long term (current) use of aspirin: Secondary | ICD-10-CM | POA: Insufficient documentation

## 2015-09-12 ENCOUNTER — Emergency Department (HOSPITAL_COMMUNITY): Payer: BLUE CROSS/BLUE SHIELD

## 2015-09-12 ENCOUNTER — Emergency Department (HOSPITAL_COMMUNITY)
Admission: EM | Admit: 2015-09-12 | Discharge: 2015-09-12 | Disposition: A | Discharge status: Home / Self Care | Payer: BLUE CROSS/BLUE SHIELD | Attending: Emergency Medicine | Admitting: Emergency Medicine

## 2015-09-12 ENCOUNTER — Encounter (HOSPITAL_COMMUNITY): Payer: Self-pay | Admitting: Emergency Medicine

## 2015-09-12 DIAGNOSIS — R69 Illness, unspecified: Secondary | ICD-10-CM

## 2015-09-12 DIAGNOSIS — J111 Influenza due to unidentified influenza virus with other respiratory manifestations: Secondary | ICD-10-CM

## 2015-09-12 MED ORDER — OSELTAMIVIR PHOSPHATE 75 MG PO CAPS: 75.0000 mg | ORAL_CAPSULE | Freq: Two times a day (BID) | ORAL | Status: DC

## 2015-09-12 MED ORDER — ONDANSETRON 4 MG PO TBDP: 4.0000 mg | ORAL_TABLET | Freq: Three times a day (TID) | ORAL | Status: DC | PRN

## 2015-09-12 MED ORDER — IBUPROFEN 800 MG PO TABS
800.0000 mg | ORAL_TABLET | Freq: Once | ORAL | Status: AC
  Administered 2015-09-12: 800 mg via ORAL
  Filled 2015-09-12: qty 1

## 2015-09-12 MED ORDER — IPRATROPIUM BROMIDE 0.02 % IN SOLN
0.5000 mg | Freq: Once | RESPIRATORY_TRACT | Status: AC
  Administered 2015-09-12: 0.5 mg via RESPIRATORY_TRACT
  Filled 2015-09-12: qty 2.5

## 2015-09-12 MED ORDER — ALBUTEROL SULFATE (2.5 MG/3ML) 0.083% IN NEBU
5.0000 mg | INHALATION_SOLUTION | Freq: Once | RESPIRATORY_TRACT | Status: AC
  Administered 2015-09-12: 5 mg via RESPIRATORY_TRACT
  Filled 2015-09-12: qty 6

## 2015-09-12 MED ORDER — ACETAMINOPHEN 500 MG PO TABS
1000.0000 mg | ORAL_TABLET | Freq: Once | ORAL | Status: AC
  Administered 2015-09-12: 1000 mg via ORAL
  Filled 2015-09-12: qty 2

## 2015-09-12 NOTE — Discharge Instructions (Signed)
Drink plenty of fluids. Use the zofran for nausea or vomiting.  Take ibuprofen 600 mg + acetaminophen 1000 mg 4 times a day for body aches and fever. Use your inhaler for wheezing. You can take mucinex DM OTC for coughing. Recheck if you have uncontrolled vomiting, have shortness of breath not relieved with using the inhaler, of if you get worse.    Influenza, Adult Influenza (flu) is an infection in the mouth, nose, and throat (respiratory tract) caused by a virus. The flu can make you feel very ill. Influenza spreads easily from person to person (contagious).  HOME CARE   Only take medicines as told by your doctor.  Use a cool mist humidifier to make breathing easier.  Get plenty of rest until your fever goes away. This usually takes 3 to 4 days.  Drink enough fluids to keep your pee (urine) clear or pale yellow.  Cover your mouth and nose when you cough or sneeze.  Wash your hands well to avoid spreading the flu.  Stay home from work or school until your fever has been gone for at least 1 full day.  Get a flu shot every year. GET HELP RIGHT AWAY IF:   You have trouble breathing or feel short of breath.  Your skin or nails turn blue.  You have severe neck pain or stiffness.  You have a severe headache, facial pain, or earache.  Your fever gets worse or keeps coming back.  You feel sick to your stomach (nauseous), throw up (vomit), or have watery poop (diarrhea).  You have chest pain.  You have a deep cough that gets worse, or you cough up more thick spit (mucus). MAKE SURE YOU:   Understand these instructions.  Will watch your condition.  Will get help right away if you are not doing well or get worse.   This information is not intended to replace advice given to you by your health care provider. Make sure you discuss any questions you have with your health care provider.   Document Released: 07/23/2008 Document Revised: 11/04/2014 Document Reviewed:  01/13/2012 Elsevier Interactive Patient Education 2016 [employer]  Cough, Adult A cough helps to clear your throat and lungs. A cough may last only 2-3 weeks (acute), or it may last longer than 8 weeks (chronic). Many different things can cause a cough. A cough may be a sign of an illness or another medical condition. HOME CARE  Pay attention to any changes in your cough.  Take medicines only as told by your doctor.  If you were prescribed an antibiotic medicine, take it as told by your doctor. Do not stop taking it even if you start to feel better.  Talk with your doctor before you try using a cough medicine.  Drink enough fluid to keep your pee (urine) clear or pale yellow.  If the air is dry, use a cold steam vaporizer or humidifier in your home.  Stay away from things that make you cough at work or at home.  If your cough is worse at night, try using extra pillows to raise your head up higher while you sleep.  Do not smoke, and try not to be around smoke. If you need help quitting, ask your doctor.  Do not have caffeine.  Do not drink alcohol.  Rest as needed. GET HELP IF:  You have new problems (symptoms).  You cough up yellow fluid (pus).  Your cough does not get better after 2-3 weeks, or your  cough gets worse.  Medicine does not help your cough and you are not sleeping well.  You have pain that gets worse or pain that is not helped with medicine.  You have a fever.  You are losing weight and you do not know why.  You have night sweats. GET HELP RIGHT AWAY IF:  You cough up blood.  You have trouble breathing.  Your heartbeat is very fast.   This information is not intended to replace advice given to you by your health care provider. Make sure you discuss any questions you have with your health care provider.   Document Released: 06/27/2011 Document Revised: 07/05/2015 Document Reviewed: 12/21/2014 Elsevier Interactive Patient Education [employer]

## 2015-09-12 NOTE — ED Provider Notes (Signed)
CSN: [id number]     Arrival date & time 09/11/15  2309 History  By signing my name below, I, Eustaquio Maize, attest that this documentation has been prepared under the direction and in the presence of Rolland Porter, MD at [place]. Electronically Signed: Eustaquio Maize, ED Scribe. 09/12/2015. 1:49 AM.  Chief Complaint  Patient presents with  . Emesis  . Generalized Body Aches  . Cough   The history is provided by the patient. No language interpreter was used.     HPI Comments: Sydney Williams is a 49 y.o. female with hx asthma who presents to the Emergency Department complaining of sudden onset, constant, achy, myalgias that began acutely around 4 PM today (approximately 9.5 hours ago). Pt states that she tried to take a nap and when she woke up she began having a productive cough with green phlegm, vomiting, chills, and sore throat. Pt reports 4 episodes of vomiting. She took her temperature at home which was 96 degrees. Her temperature in the ED is 99.1. She notes similar symptoms when she had the flu in the past. Pt did receive a flu vaccine approximately 1 month ago. Denies diarrhea, shortness of breath, or any other associated symptoms. No recent sick contact but pt does work as a Quarry manager at a nursing home. She cannot think of any patients with flu like symptoms.   PCP PA Tysinger  Past Medical History  Diagnosis Date  . Hypertension   . GERD (gastroesophageal reflux disease)   . Migraines   . Hyperlipidemia   . Diabetes mellitus     vs impaired fasting glucose  . Asthma     2015 hospitaliation for asthma   Past Surgical History  Procedure Laterality Date  . Tubal ligation    . Cyst excision      left neck/postauricular region, benign   Family History  Problem Relation Age of Onset  . Diabetes Mother   . Hypertension Mother   . Aneurysm Mother   . Stroke Mother   . Cancer Mother     cervical cancer  . Cancer Maternal Aunt     breast  . Heart disease Neg Hx   . Cancer Cousin      breast/breast   Social History  Substance Use Topics  . Smoking status: Current Every Day Smoker -- 0.25 packs/day for 10 years    Types: Cigarettes  . Smokeless tobacco: Never Used  . Alcohol Use: Yes     Comment: social    employed as a Quarry manager in [place]  OB History    Gravida Para Term Preterm AB TAB SAB Ectopic Multiple Living   6 3 3  3  2 1  3      Review of Systems  Constitutional: Positive for chills.  HENT: Positive for sore throat.   Respiratory: Positive for cough. Negative for shortness of breath.   Cardiovascular: Negative for chest pain.  Gastrointestinal: Positive for nausea and vomiting. Negative for diarrhea.  Musculoskeletal: Positive for myalgias.  All other systems reviewed and are negative.  Allergies  Pneumococcal vaccines and Vicodin  Home Medications   Prior to Admission medications   Medication Sig Start Date End Date Taking? Authorizing Provider  albuterol (PROVENTIL HFA;VENTOLIN HFA) 108 (90 BASE) MCG/ACT inhaler Inhale 2 puffs into the lungs every 6 (six) hours as needed for wheezing. 06/01/15  Yes Camelia Eng Tysinger, PA-C  albuterol (PROVENTIL) (2.5 MG/3ML) 0.083% nebulizer solution Take 3 mLs (2.5 mg total) by nebulization every 6 (six) hours as  needed for wheezing or shortness of breath. 12/01/14  Yes Camelia Eng Tysinger, PA-C  amLODipine (NORVASC) 5 MG tablet Take 1 tablet (5 mg total) by mouth daily. 12/01/14  Yes Camelia Eng Tysinger, PA-C  aspirin EC 81 MG tablet Take 1 tablet (81 mg total) by mouth daily. 12/01/14  Yes Camelia Eng Tysinger, PA-C  atorvastatin (LIPITOR) 10 MG tablet Take 1 tablet (10 mg total) by mouth daily. 12/01/14  Yes Camelia Eng Tysinger, PA-C  Fluticasone-Salmeterol (ADVAIR) 250-50 MCG/DOSE AEPB Inhale 1 puff into the lungs 2 (two) times daily.   Yes Historical Provider, MD  ibuprofen (ADVIL,MOTRIN) 200 MG tablet Take 800 mg by mouth every 6 (six) hours as needed for moderate pain.   Yes Historical Provider, MD  lisinopril-hydrochlorothiazide  (PRINZIDE,ZESTORETIC) 20-25 MG per tablet Take 1 tablet by mouth daily. 12/01/14  Yes Camelia Eng Tysinger, PA-C  metFORMIN (GLUCOPHAGE) 500 MG tablet Take 1 tablet (500 mg total) by mouth daily with breakfast. 12/01/14  Yes Camelia Eng Tysinger, PA-C  omeprazole (PRILOSEC) 40 MG capsule Take 1 capsule (40 mg total) by mouth daily. 06/01/15  Yes Camelia Eng Tysinger, PA-C  acyclovir (ZOVIRAX) 200 MG capsule TAKE ONE CAPSULE BY MOUTH TWICE DAILY Patient not taking: Reported on 09/12/2015 09/05/15   Camelia Eng Tysinger, PA-C  ibuprofen (ADVIL,MOTRIN) 800 MG tablet Take 1 tablet (800 mg total) by mouth every 8 (eight) hours as needed. Patient not taking: Reported on 09/12/2015 07/24/15   Tresea Mall, CNM  metFORMIN (GLUCOPHAGE) 500 MG tablet TAKE ONE TABLET BY MOUTH ONCE DAILY WITH BREAKFAST Patient not taking: Reported on 09/12/2015 08/23/15   Camelia Eng Tysinger, PA-C  ondansetron (ZOFRAN ODT) 4 MG disintegrating tablet Take 1 tablet (4 mg total) by mouth every 8 (eight) hours as needed for nausea or vomiting. 09/12/15   Rolland Porter, MD  oseltamivir (TAMIFLU) 75 MG capsule Take 1 capsule (75 mg total) by mouth every 12 (twelve) hours. 09/12/15   Rolland Porter, MD   Triage Vitals: BP 145/87 mmHg  Pulse 121  Temp(Src) 99.1 F (37.3 C) (Oral)  Resp 22  SpO2 97%  LMP 06/29/2015 (Exact Date)   Vital signs normal except for tachycardia   Physical Exam  Constitutional: She is oriented to person, place, and time. She appears well-developed and well-nourished.  Non-toxic appearance. She does not appear ill. No distress.  Very hot to touch, obese  HENT:  Head: Normocephalic and atraumatic.  Right Ear: External ear normal.  Left Ear: External ear normal.  Nose: Nose normal. No mucosal edema or rhinorrhea.  Mouth/Throat: Oropharynx is clear and moist and mucous membranes are normal. No dental abscesses or uvula swelling.  Eyes: Conjunctivae and EOM are normal. Pupils are equal, round, and reactive to light.  Neck: Normal  range of motion and full passive range of motion without pain. Neck supple.  Cardiovascular: Normal rate, regular rhythm and normal heart sounds.  Exam reveals no gallop and no friction rub.   No murmur heard. Pulmonary/Chest: Effort normal. No respiratory distress. She has no wheezes. She has no rhonchi. She has no rales. She exhibits no tenderness and no crepitus.  Diminished breath sounds  Abdominal: Soft. Normal appearance and bowel sounds are normal. She exhibits no distension. There is no tenderness. There is no rebound and no guarding.  Musculoskeletal: Normal range of motion. She exhibits no edema or tenderness.  Moves all extremities well.   Neurological: She is alert and oriented to person, place, and time. She has normal strength. No  cranial nerve deficit.  Skin: Skin is warm, dry and intact. No rash noted. No erythema. No pallor.  Psychiatric: She has a normal mood and affect. Her speech is normal and behavior is normal. Her mood appears not anxious.  Nursing note and vitals reviewed.   ED Course  Procedures (including critical care time)  Medications  albuterol (PROVENTIL) (2.5 MG/3ML) 0.083% nebulizer solution 5 mg (5 mg Nebulization Given 09/12/15 0157)  ipratropium (ATROVENT) nebulizer solution 0.5 mg (0.5 mg Nebulization Given 09/12/15 0157)  ibuprofen (ADVIL,MOTRIN) tablet 800 mg (800 mg Oral Given 09/12/15 0411)  acetaminophen (TYLENOL) tablet 1,000 mg (1,000 mg Oral Given 09/12/15 0411)     DIAGNOSTIC STUDIES: Oxygen Saturation is 97% on RA, normal by my interpretation.    COORDINATION OF CARE: 1:49 AM-Discussed treatment plan which includes CXR and breathing treatment with pt at bedside and pt agreed to plan. Patient states she feels like she needs a nebulizer treatment because "I think my asthma is going to act up".  Due to patient feeling hot her temperature was repeated and she did have a fever of 101.3. Her fever was treated with Motrin and acetaminophen. Chest  x-ray was done to rule out pneumonia.  3:40 AM - Discussed negative x ray results. Will prescribe Tamiflu and discharge pt home. Pt understands and agrees with plan.   Imaging Review Dg Chest 2 View  09/12/2015  CLINICAL DATA:  Cough, fever and chills for 2 days. EXAM: CHEST  2 VIEW COMPARISON:  04/07/2015 FINDINGS: The cardiomediastinal contours are normal. The lungs are clear. Pulmonary vasculature is normal. No consolidation, pleural effusion, or pneumothorax. No acute osseous abnormalities are seen. Small cervical rib on the right. IMPRESSION: No acute pulmonary process. Electronically Signed   By: Jeb Levering M.D.   On: 09/12/2015 02:34   I have personally reviewed and evaluated these images as part of my medical decision-making.    MDM   Final diagnoses:  Influenza-like illness   Discharge Medication List as of 09/12/2015  3:57 AM    START taking these medications   Details  ondansetron (ZOFRAN ODT) 4 MG disintegrating tablet Take 1 tablet (4 mg total) by mouth every 8 (eight) hours as needed for nausea or vomiting., Starting 09/12/2015, Until Discontinued, Print    oseltamivir (TAMIFLU) 75 MG capsule Take 1 capsule (75 mg total) by mouth every 12 (twelve) hours., Starting 09/12/2015, Until Discontinued, Print       Plan discharge  Rolland Porter, MD, FACEP    I personally performed the services described in this documentation, which was scribed in my presence. The recorded information has been reviewed and considered.  Rolland Porter, MD, Barbette Or, MD 09/12/15 [phone]

## 2015-09-12 NOTE — ED Notes (Signed)
Pt is c/o aching all over, cough, vomiting x 4 today, chills  Pt states her sxs started today around 4pm

## 2015-09-13 ENCOUNTER — Encounter (HOSPITAL_COMMUNITY): Payer: Self-pay | Admitting: Emergency Medicine

## 2015-09-13 ENCOUNTER — Emergency Department (HOSPITAL_COMMUNITY): Payer: BLUE CROSS/BLUE SHIELD

## 2015-09-13 ENCOUNTER — Emergency Department (HOSPITAL_COMMUNITY)
Admission: EM | Admit: 2015-09-13 | Discharge: 2015-09-13 | Disposition: A | Discharge status: Home / Self Care | Payer: BLUE CROSS/BLUE SHIELD | Attending: Emergency Medicine | Admitting: Emergency Medicine

## 2015-09-13 DIAGNOSIS — I10 Essential (primary) hypertension: Secondary | ICD-10-CM | POA: Insufficient documentation

## 2015-09-13 DIAGNOSIS — Z7951 Long term (current) use of inhaled steroids: Secondary | ICD-10-CM | POA: Insufficient documentation

## 2015-09-13 DIAGNOSIS — R0602 Shortness of breath: Secondary | ICD-10-CM | POA: Diagnosis present

## 2015-09-13 DIAGNOSIS — Z7984 Long term (current) use of oral hypoglycemic drugs: Secondary | ICD-10-CM | POA: Diagnosis not present

## 2015-09-13 DIAGNOSIS — Z7982 Long term (current) use of aspirin: Secondary | ICD-10-CM | POA: Diagnosis not present

## 2015-09-13 DIAGNOSIS — Z79899 Other long term (current) drug therapy: Secondary | ICD-10-CM | POA: Diagnosis not present

## 2015-09-13 DIAGNOSIS — E785 Hyperlipidemia, unspecified: Secondary | ICD-10-CM | POA: Insufficient documentation

## 2015-09-13 DIAGNOSIS — F17210 Nicotine dependence, cigarettes, uncomplicated: Secondary | ICD-10-CM | POA: Diagnosis not present

## 2015-09-13 DIAGNOSIS — E119 Type 2 diabetes mellitus without complications: Secondary | ICD-10-CM | POA: Diagnosis not present

## 2015-09-13 DIAGNOSIS — R059 Cough, unspecified: Secondary | ICD-10-CM

## 2015-09-13 DIAGNOSIS — R05 Cough: Secondary | ICD-10-CM

## 2015-09-13 DIAGNOSIS — J45901 Unspecified asthma with (acute) exacerbation: Secondary | ICD-10-CM | POA: Diagnosis not present

## 2015-09-13 DIAGNOSIS — K219 Gastro-esophageal reflux disease without esophagitis: Secondary | ICD-10-CM | POA: Insufficient documentation

## 2015-09-13 MED ORDER — ALBUTEROL SULFATE (2.5 MG/3ML) 0.083% IN NEBU
5.0000 mg | INHALATION_SOLUTION | Freq: Once | RESPIRATORY_TRACT | Status: AC
  Administered 2015-09-13: 5 mg via RESPIRATORY_TRACT
  Filled 2015-09-13: qty 6

## 2015-09-13 MED ORDER — ALBUTEROL SULFATE HFA 108 (90 BASE) MCG/ACT IN AERS
2.0000 | INHALATION_SPRAY | Freq: Four times a day (QID) | RESPIRATORY_TRACT | Status: DC | PRN
  Administered 2015-09-13: 2 via RESPIRATORY_TRACT
  Filled 2015-09-13: qty 6.7

## 2015-09-13 MED ORDER — PREDNISONE 20 MG PO TABS: 40.0000 mg | ORAL_TABLET | Freq: Every day | ORAL | Status: DC

## 2015-09-13 MED ORDER — PREDNISONE 20 MG PO TABS
60.0000 mg | ORAL_TABLET | Freq: Once | ORAL | Status: AC
  Administered 2015-09-13: 60 mg via ORAL
  Filled 2015-09-13: qty 3

## 2015-09-13 NOTE — ED Notes (Signed)
Bed: WA23 Expected date:  Expected time:  Means of arrival:  Comments: Triage 

## 2015-09-13 NOTE — ED Provider Notes (Signed)
CSN: [id number]     Arrival date & time 09/13/15  1124 History   First MD Initiated Contact with Patient 09/13/15 1320     Chief Complaint  Patient presents with  . Shortness of Breath  . Asthma  . Cough     (Consider location/radiation/quality/duration/timing/severity/associated sxs/prior Treatment) HPI Comments: Patient presents to the emergency department with chief complaint of asthma exacerbation, shortness breath, and cough. She states that she was diagnosed with the flu earlier this week. She states that she was unable to get her medications. She states that she thinks this aggravated her asthma. She states that last year, she got the flu, and then had an asthma exacerbation shortly thereafter. She reports no more fever, but has been taking Tylenol. She states that she still has a productive cough. There are no aggravating or alleviating factors.  The history is provided by the patient. No language interpreter was used.    Past Medical History  Diagnosis Date  . Hypertension   . GERD (gastroesophageal reflux disease)   . Migraines   . Hyperlipidemia   . Diabetes mellitus     vs impaired fasting glucose  . Asthma     2015 hospitaliation for asthma   Past Surgical History  Procedure Laterality Date  . Tubal ligation    . Cyst excision      left neck/postauricular region, benign   Family History  Problem Relation Age of Onset  . Diabetes Mother   . Hypertension Mother   . Aneurysm Mother   . Stroke Mother   . Cancer Mother     cervical cancer  . Cancer Maternal Aunt     breast  . Heart disease Neg Hx   . Cancer Cousin     breast/breast   Social History  Substance Use Topics  . Smoking status: Current Every Day Smoker -- 0.25 packs/day for 10 years    Types: Cigarettes  . Smokeless tobacco: Never Used  . Alcohol Use: Yes     Comment: social    OB History    Gravida Para Term Preterm AB TAB SAB Ectopic Multiple Living   6 3 3  3  2 1  3      Review of  Systems  Constitutional: Negative for fever and chills.  Respiratory: Positive for cough, shortness of breath and wheezing.   Cardiovascular: Negative for chest pain.  Gastrointestinal: Negative for nausea, vomiting, diarrhea and constipation.  Genitourinary: Negative for dysuria.  All other systems reviewed and are negative.     Allergies  Pneumococcal vaccines and Vicodin  Home Medications   Prior to Admission medications   Medication Sig Start Date End Date Taking? Authorizing Provider  acetaminophen (TYLENOL) 500 MG tablet Take 1,000 mg by mouth every 6 (six) hours as needed for moderate pain or headache.   Yes Historical Provider, MD  acyclovir (ZOVIRAX) 200 MG capsule TAKE ONE CAPSULE BY MOUTH TWICE DAILY 09/05/15  Yes Camelia Eng Tysinger, PA-C  albuterol (PROVENTIL HFA;VENTOLIN HFA) 108 (90 BASE) MCG/ACT inhaler Inhale 2 puffs into the lungs every 6 (six) hours as needed for wheezing. 06/01/15  Yes Camelia Eng Tysinger, PA-C  albuterol (PROVENTIL) (2.5 MG/3ML) 0.083% nebulizer solution Take 3 mLs (2.5 mg total) by nebulization every 6 (six) hours as needed for wheezing or shortness of breath. 12/01/14  Yes Camelia Eng Tysinger, PA-C  amLODipine (NORVASC) 5 MG tablet Take 1 tablet (5 mg total) by mouth daily. 12/01/14  Yes Camelia Eng Tysinger, PA-C  aspirin EC  81 MG tablet Take 1 tablet (81 mg total) by mouth daily. 12/01/14  Yes Camelia Eng Tysinger, PA-C  atorvastatin (LIPITOR) 10 MG tablet Take 1 tablet (10 mg total) by mouth daily. 12/01/14  Yes Camelia Eng Tysinger, PA-C  Fluticasone-Salmeterol (ADVAIR) 250-50 MCG/DOSE AEPB Inhale 1 puff into the lungs 2 (two) times daily.   Yes Historical Provider, MD  ibuprofen (ADVIL,MOTRIN) 200 MG tablet Take 800 mg by mouth every 6 (six) hours as needed for moderate pain.   Yes Historical Provider, MD  lisinopril-hydrochlorothiazide (PRINZIDE,ZESTORETIC) 20-25 MG per tablet Take 1 tablet by mouth daily. 12/01/14  Yes Camelia Eng Tysinger, PA-C  metFORMIN (GLUCOPHAGE) 500 MG  tablet Take 1 tablet (500 mg total) by mouth daily with breakfast. 12/01/14  Yes Camelia Eng Tysinger, PA-C  ibuprofen (ADVIL,MOTRIN) 800 MG tablet Take 1 tablet (800 mg total) by mouth every 8 (eight) hours as needed. Patient not taking: Reported on 09/12/2015 07/24/15   Tresea Mall, CNM  metFORMIN (GLUCOPHAGE) 500 MG tablet TAKE ONE TABLET BY MOUTH ONCE DAILY WITH BREAKFAST Patient not taking: Reported on 09/12/2015 08/23/15   Camelia Eng Tysinger, PA-C  omeprazole (PRILOSEC) 40 MG capsule Take 1 capsule (40 mg total) by mouth daily. 06/01/15   Camelia Eng Tysinger, PA-C  ondansetron (ZOFRAN ODT) 4 MG disintegrating tablet Take 1 tablet (4 mg total) by mouth every 8 (eight) hours as needed for nausea or vomiting. 09/12/15   Rolland Porter, MD  oseltamivir (TAMIFLU) 75 MG capsule Take 1 capsule (75 mg total) by mouth every 12 (twelve) hours. 09/12/15   Rolland Porter, MD   BP 125/82 mmHg  Pulse 92  Temp(Src) 98.2 F (36.8 C) (Oral)  Resp 22  SpO2 100%  LMP 06/29/2015 (Exact Date) Physical Exam  Constitutional: She is oriented to person, place, and time. She appears well-developed and well-nourished.  HENT:  Head: Normocephalic and atraumatic.  Eyes: Conjunctivae and EOM are normal. Pupils are equal, round, and reactive to light.  Neck: Normal range of motion. Neck supple.  Cardiovascular: Normal rate and regular rhythm.  Exam reveals no gallop and no friction rub.   No murmur heard. Pulmonary/Chest: Effort normal. No respiratory distress. She has wheezes. She has no rales. She exhibits no tenderness.  Abdominal: Soft. Bowel sounds are normal. She exhibits no distension and no mass. There is no tenderness. There is no rebound and no guarding.  Musculoskeletal: Normal range of motion. She exhibits no edema or tenderness.  Neurological: She is alert and oriented to person, place, and time.  Skin: Skin is warm and dry.  Psychiatric: She has a normal mood and affect. Her behavior is normal. Judgment and thought  content normal.  Nursing note and vitals reviewed.   ED Course  Procedures (including critical care time) Labs Review Labs Reviewed - No data to display  Imaging Review Dg Chest 2 View  09/12/2015  CLINICAL DATA:  Cough, fever and chills for 2 days. EXAM: CHEST  2 VIEW COMPARISON:  04/07/2015 FINDINGS: The cardiomediastinal contours are normal. The lungs are clear. Pulmonary vasculature is normal. No consolidation, pleural effusion, or pneumothorax. No acute osseous abnormalities are seen. Small cervical rib on the right. IMPRESSION: No acute pulmonary process. Electronically Signed   By: Jeb Levering M.D.   On: 09/12/2015 02:34   I have personally reviewed and evaluated these images and lab results as part of my medical decision-making.   EKG Interpretation None      MDM   Final diagnoses:  Cough  Asthma exacerbation    Patient with cough, wheezing, and asthma. Likely asthma exacerbation. Repeat chest x-ray today to ensure that no infiltrate has made itself manifest.  Patient reports that she feels better after breathing treatment. She is afebrile. Chest x-ray is negative. Symptoms could be related to flu. Could be asthma exacerbation. No chest pain. Doubt PE or ACS. Will discharge to home with primary care follow-up. Patient understands and agrees with the plan. She is stable and ready for discharge.    Montine Circle, PA-C 09/13/15 [place], MD 09/14/15 1455

## 2015-09-13 NOTE — ED Notes (Signed)
States she was dx with the flu recently. Says the flu has made her asthma start "acting up." No wheezing/abnormal lung sounds auscultated in triage, does not appear to be in any obvious distress. States she used her albuterol inhaler prior to arrival. Dry cough observed.

## 2015-09-13 NOTE — Discharge Instructions (Signed)
Asthma, Adult Asthma is a recurring condition in which the airways tighten and narrow. Asthma can make it difficult to breathe. It can cause coughing, wheezing, and shortness of breath. Asthma episodes, also called asthma attacks, range from minor to life-threatening. Asthma cannot be cured, but medicines and lifestyle changes can help control it. CAUSES Asthma is believed to be caused by inherited (genetic) and environmental factors, but its exact cause is unknown. Asthma may be triggered by allergens, lung infections, or irritants in the air. Asthma triggers are different for each person. Common triggers include:   Animal dander.  Dust mites.  Cockroaches.  Pollen from trees or grass.  Mold.  Smoke.  Air pollutants such as dust, household cleaners, hair sprays, aerosol sprays, paint fumes, strong chemicals, or strong odors.  Cold air, weather changes, and winds (which increase molds and pollens in the air).  Strong emotional expressions such as crying or laughing hard.  Stress.  Certain medicines (such as aspirin) or types of drugs (such as beta-blockers).  Sulfites in foods and drinks. Foods and drinks that may contain sulfites include dried fruit, potato chips, and sparkling grape juice.  Infections or inflammatory conditions such as the flu, a cold, or an inflammation of the nasal membranes (rhinitis).  Gastroesophageal reflux disease (GERD).  Exercise or strenuous activity. SYMPTOMS Symptoms may occur immediately after asthma is triggered or many hours later. Symptoms include:  Wheezing.  Excessive nighttime or early morning coughing.  Frequent or severe coughing with a common cold.  Chest tightness.  Shortness of breath. DIAGNOSIS  The diagnosis of asthma is made by a review of your medical history and a physical exam. Tests may also be performed. These may include:  Lung function studies. These tests show how much air you breathe in and out.  Allergy  tests.  Imaging tests such as X-rays. TREATMENT  Asthma cannot be cured, but it can usually be controlled. Treatment involves identifying and avoiding your asthma triggers. It also involves medicines. There are 2 classes of medicine used for asthma treatment:   Controller medicines. These prevent asthma symptoms from occurring. They are usually taken every day.  Reliever or rescue medicines. These quickly relieve asthma symptoms. They are used as needed and provide short-term relief. Your health care provider will help you create an asthma action plan. An asthma action plan is a written plan for managing and treating your asthma attacks. It includes a list of your asthma triggers and how they may be avoided. It also includes information on when medicines should be taken and when their dosage should be changed. An action plan may also involve the use of a device called a peak flow meter. A peak flow meter measures how well the lungs are working. It helps you monitor your condition. HOME CARE INSTRUCTIONS   Take medicines only as directed by your health care provider. Speak with your health care provider if you have questions about how or when to take the medicines.  Use a peak flow meter as directed by your health care provider. Record and keep track of readings.  Understand and use the action plan to help minimize or stop an asthma attack without needing to seek medical care.  Control your home environment in the following ways to help prevent asthma attacks:  Do not smoke. Avoid being exposed to secondhand smoke.  Change your heating and air conditioning filter regularly.  Limit your use of fireplaces and wood stoves.  Get rid of pests (such as roaches   and mice) and their droppings.  Throw away plants if you see mold on them.  Clean your floors and dust regularly. Use unscented cleaning products.  Try to have someone else vacuum for you regularly. Stay out of rooms while they are  being vacuumed and for a short while afterward. If you vacuum, use a dust mask from a hardware store, a double-layered or microfilter vacuum cleaner bag, or a vacuum cleaner with a HEPA filter.  Replace carpet with wood, tile, or vinyl flooring. Carpet can trap dander and dust.  Use allergy-proof pillows, mattress covers, and box spring covers.  Wash bed sheets and blankets every week in hot water and dry them in a dryer.  Use blankets that are made of polyester or cotton.  Clean bathrooms and kitchens with bleach. If possible, have someone repaint the walls in these rooms with mold-resistant paint. Keep out of the rooms that are being cleaned and painted.  Wash hands frequently. SEEK MEDICAL CARE IF:   You have wheezing, shortness of breath, or a cough even if taking medicine to prevent attacks.  The colored mucus you cough up (sputum) is thicker than usual.  Your sputum changes from clear or white to yellow, green, gray, or bloody.  You have any problems that may be related to the medicines you are taking (such as a rash, itching, swelling, or trouble breathing).  You are using a reliever medicine more than 2-3 times per week.  Your peak flow is still at 50-79% of your personal best after following your action plan for 1 hour.  You have a fever. SEEK IMMEDIATE MEDICAL CARE IF:   You seem to be getting worse and are unresponsive to treatment during an asthma attack.  You are short of breath even at rest.  You get short of breath when doing very little physical activity.  You have difficulty eating, drinking, or talking due to asthma symptoms.  You develop chest pain.  You develop a fast heartbeat.  You have a bluish color to your lips or fingernails.  You are light-headed, dizzy, or faint.  Your peak flow is less than 50% of your personal best.   This information is not intended to replace advice given to you by your health care provider. Make sure you discuss any  questions you have with your health care provider.   Document Released: 10/14/2005 Document Revised: 07/05/2015 Document Reviewed: 05/13/2013 Elsevier Interactive Patient Education 2016 [employer]  

## 2015-10-04 ENCOUNTER — Other Ambulatory Visit: Payer: Self-pay | Admitting: Medical

## 2015-10-04 NOTE — Telephone Encounter (Signed)
Is this ok to refill?  

## 2015-10-12 ENCOUNTER — Ambulatory Visit (INDEPENDENT_AMBULATORY_CARE_PROVIDER_SITE_OTHER): Payer: BLUE CROSS/BLUE SHIELD | Admitting: Family Medicine

## 2015-10-12 ENCOUNTER — Encounter: Payer: Self-pay | Admitting: Family Medicine

## 2015-10-12 VITALS — BP 122/80 | HR 92 | Temp 97.5°F | Ht 70.0 in | Wt 262.6 lb

## 2015-10-12 DIAGNOSIS — J019 Acute sinusitis, unspecified: Secondary | ICD-10-CM

## 2015-10-12 DIAGNOSIS — I10 Essential (primary) hypertension: Secondary | ICD-10-CM | POA: Diagnosis not present

## 2015-10-12 DIAGNOSIS — E119 Type 2 diabetes mellitus without complications: Secondary | ICD-10-CM | POA: Diagnosis not present

## 2015-10-12 DIAGNOSIS — Z720 Tobacco use: Secondary | ICD-10-CM

## 2015-10-12 MED ORDER — AMOXICILLIN 500 MG PO TABS: 1000.0000 mg | ORAL_TABLET | Freq: Two times a day (BID) | ORAL | Status: DC

## 2015-10-12 NOTE — Patient Instructions (Addendum)
  Drink plenty of water. Take the antibiotics as directed twice daily for 10 days. Consider using guaifenesin (mucinex or robitussin) to help loosen the mucus/phlegm. Consider sinus rinses once or twice daily. Continue ibuprofen or tylenol as needed for pain. Continue lozenges and/or salt water gargles as needed.  Please work on quitting smoking. Continue your asthma medications--your lungs were very clear today.   Please try and quit smoking--start thinking about why/when you smoke (habit, boredom, stress) in order to come up with effective strategies to cut back or quit. Available resources to help you quit include free counseling through [facility] ([facility] or 1-800-QUITNOW), smoking cessation classes through [facility] (call to find out schedule), over-the-counter nicotine replacements, and e-cigarettes (although this may not help break the hand-mouth habit).  Many insurance companies also have smoking cessation programs (which may decrease the cost of patches, meds if enrolled).  If these methods are not effective for you, and you are motivated to quit, return to discuss the possibility of prescription medications.

## 2015-10-12 NOTE — Progress Notes (Signed)
Chief Complaint  Patient presents with  . Sore Throat    x 1 week. Hurts worse in the mornings, hard to swallow. Some runny nose and nasal congestion with green mucus. No fevers, and slight cough-not as bad as it was last month when she "had the flu."   She had the flu last month (treated with Tamiflu), followed by an asthma attack that was treated with steroids.  She had some persistent cough, productive of green phlegm and also still having green nasal mucus. She presents today due to worsening of sore throat. Last week it started hurting so badly in the morning that it feels like she can't swallow.  She takes ibuprofen, which helps temporarily, but sore throat recurs later in the day.  Denies headaches, sinus pain, ear pain, fever. No sick contacts.  She is a smoker, and has asthma. She also has diabetes.  Didn't check her sugars when on the prednisone.  PMH, PSH, SH reviewed and updated  Outpatient Encounter Prescriptions as of 10/12/2015  Medication Sig Note  . amLODipine (NORVASC) 5 MG tablet Take 1 tablet (5 mg total) by mouth daily.   Marland Kitchen aspirin EC 81 MG tablet Take 1 tablet (81 mg total) by mouth daily.   Marland Kitchen atorvastatin (LIPITOR) 10 MG tablet Take 1 tablet (10 mg total) by mouth daily.   . Fluticasone-Salmeterol (ADVAIR) 250-50 MCG/DOSE AEPB Inhale 1 puff into the lungs 2 (two) times daily.   Marland Kitchen ibuprofen (ADVIL,MOTRIN) 200 MG tablet Take 800 mg by mouth every 6 (six) hours as needed for moderate pain.   Marland Kitchen lisinopril-hydrochlorothiazide (PRINZIDE,ZESTORETIC) 20-25 MG per tablet Take 1 tablet by mouth daily.   . metFORMIN (GLUCOPHAGE) 500 MG tablet Take 1 tablet (500 mg total) by mouth daily with breakfast.   . omeprazole (PRILOSEC) 40 MG capsule Take 1 capsule (40 mg total) by mouth daily.   Marland Kitchen acetaminophen (TYLENOL) 500 MG tablet Take 1,000 mg by mouth every 6 (six) hours as needed for moderate pain or headache. Reported on 10/12/2015   . acyclovir (ZOVIRAX) 200 MG capsule TAKE ONE  CAPSULE BY MOUTH TWICE DAILY (Patient not taking: Reported on 10/12/2015) 10/12/2015: Uses prn  . albuterol (PROVENTIL HFA;VENTOLIN HFA) 108 (90 BASE) MCG/ACT inhaler Inhale 2 puffs into the lungs every 6 (six) hours as needed for wheezing. (Patient not taking: Reported on 10/12/2015) 10/12/2015: Hasn't been needing this  . albuterol (PROVENTIL) (2.5 MG/3ML) 0.083% nebulizer solution Take 3 mLs (2.5 mg total) by nebulization every 6 (six) hours as needed for wheezing or shortness of breath. (Patient not taking: Reported on 10/12/2015)   . ondansetron (ZOFRAN ODT) 4 MG disintegrating tablet Take 1 tablet (4 mg total) by mouth every 8 (eight) hours as needed for nausea or vomiting. (Patient not taking: Reported on 10/12/2015)   . [DISCONTINUED] ibuprofen (ADVIL,MOTRIN) 800 MG tablet Take 1 tablet (800 mg total) by mouth every 8 (eight) hours as needed. (Patient not taking: Reported on 09/12/2015)   . [DISCONTINUED] metFORMIN (GLUCOPHAGE) 500 MG tablet TAKE ONE TABLET BY MOUTH ONCE DAILY WITH BREAKFAST (Patient not taking: Reported on 09/12/2015)   . [DISCONTINUED] oseltamivir (TAMIFLU) 75 MG capsule Take 1 capsule (75 mg total) by mouth every 12 (twelve) hours. (Patient not taking: Reported on 10/12/2015)   . [DISCONTINUED] predniSONE (DELTASONE) 20 MG tablet Take 2 tablets (40 mg total) by mouth daily. (Patient not taking: Reported on 10/12/2015)    No facility-administered encounter medications on file as of 10/12/2015.   Allergies  Allergen Reactions  .  Pneumococcal Vaccines Swelling    Swelling at injection site   . Vicodin [Hydrocodone-Acetaminophen] Rash   ROS:  Denies fever, chills, wheezing/asthma flare currently.  Denies headaches, dizziness, chest pain, palpitations. Denies nausea, vomiting, diarrhea, bleeding, bruising, rashes. No polydipsia, polyuria.  See HPI  PHYSICAL EXAM: BP 122/80 mmHg  Pulse 92  Temp(Src) 97.5 F (36.4 C) (Tympanic)  Ht 5\' 10"  (1.778 m)  Wt 262 lb 9.6 oz  (119.115 kg)  BMI 37.68 kg/m2  LMP 06/29/2015 (Exact Date) Well developed, pleasant, obese female in no distress HEENT: PERRL, EOMI, conjunctiva clear. TMs' and EACs normal. Nasal mucosa is moderately edematous, no particular erythema. White mucus noted in nares. Sinuses are nontender.  OP has erythema posteriorly.  Otherwise is normal--no exudate, no thrush, moist mucus membranes Neck: no lymphadenopatjhy or mass. Mildly tender posteriorly (muscular), but no masses/LAD Heart: regular rate and rhythm without murmur Lungs: clear bilaterally, no wheezes, rales or ronchi Skin: no rash Skin: normal turgor, no rashes Psych: normal mood, affect, hygiene and grooming Neuro: alert and oriented. Cranial nerves intact. Normal strength, gait  ASSESSMENT/PLAN:  Acute sinusitis, recurrence not specified, unspecified location - Plan: amoxicillin (AMOXIL) 500 MG tablet  Tobacco abuse - risks/consequences reviewed, counseled on cessatin  Essential hypertension - controlled  Type 2 diabetes mellitus without complication, without long-term current use of insulin ([place]) - due for f/u with Aud[facility] Acton (no A1c checked since Feb, but had been well controlled); periodically check sugars   49 yo diabetic, asthmatic smoker with persistent purulent nasal drainage and productive cough, with worsening sore throat.  Suspect sinus infection. 49 yo at home has not been on any antibiotics, so low risk for reistance. Treat with Amoxil 1000mg  BID x 10 days.  Call in a week if symptoms aren't improving, sooner if worse.  Smoking cessation counseling given, strongly encouraged cessation, discussed risks, and available options to help her (also has through her job).    Drink plenty of water. Take the antibiotics as directed twice daily for 10 days. Consider using guaifenesin (mucinex or robitussin) to help loosen the mucus/phlegm. Consider sinus rinses once or twice daily. Continue ibuprofen or tylenol as needed for  pain. Continue lozenges and/or salt water gargles as needed.  Please work on quitting smoking. Continue your asthma medications--your lungs were very clear today.   25 min visit, more than 1/2 spent counseling re: smoking, cardiac risks, supportive measures, side effects, etc.

## 2015-10-30 ENCOUNTER — Other Ambulatory Visit: Payer: Self-pay | Admitting: Medical

## 2015-10-31 ENCOUNTER — Encounter (HOSPITAL_COMMUNITY): Payer: Self-pay | Admitting: Family Medicine

## 2015-10-31 ENCOUNTER — Emergency Department (HOSPITAL_COMMUNITY)
Admission: EM | Admit: 2015-10-31 | Discharge: 2015-10-31 | Disposition: A | Discharge status: Home / Self Care | Payer: BLUE CROSS/BLUE SHIELD | Attending: Emergency Medicine | Admitting: Emergency Medicine

## 2015-10-31 ENCOUNTER — Emergency Department (HOSPITAL_COMMUNITY): Payer: BLUE CROSS/BLUE SHIELD

## 2015-10-31 DIAGNOSIS — Z792 Long term (current) use of antibiotics: Secondary | ICD-10-CM | POA: Insufficient documentation

## 2015-10-31 DIAGNOSIS — E119 Type 2 diabetes mellitus without complications: Secondary | ICD-10-CM | POA: Insufficient documentation

## 2015-10-31 DIAGNOSIS — J4531 Mild persistent asthma with (acute) exacerbation: Secondary | ICD-10-CM | POA: Diagnosis not present

## 2015-10-31 DIAGNOSIS — Z7984 Long term (current) use of oral hypoglycemic drugs: Secondary | ICD-10-CM | POA: Diagnosis not present

## 2015-10-31 DIAGNOSIS — E785 Hyperlipidemia, unspecified: Secondary | ICD-10-CM | POA: Diagnosis not present

## 2015-10-31 DIAGNOSIS — Z79899 Other long term (current) drug therapy: Secondary | ICD-10-CM | POA: Diagnosis not present

## 2015-10-31 DIAGNOSIS — J4530 Mild persistent asthma, uncomplicated: Secondary | ICD-10-CM

## 2015-10-31 DIAGNOSIS — Z7982 Long term (current) use of aspirin: Secondary | ICD-10-CM | POA: Insufficient documentation

## 2015-10-31 DIAGNOSIS — F17210 Nicotine dependence, cigarettes, uncomplicated: Secondary | ICD-10-CM | POA: Insufficient documentation

## 2015-10-31 DIAGNOSIS — Z7951 Long term (current) use of inhaled steroids: Secondary | ICD-10-CM | POA: Insufficient documentation

## 2015-10-31 DIAGNOSIS — I10 Essential (primary) hypertension: Secondary | ICD-10-CM | POA: Insufficient documentation

## 2015-10-31 DIAGNOSIS — G43909 Migraine, unspecified, not intractable, without status migrainosus: Secondary | ICD-10-CM | POA: Diagnosis not present

## 2015-10-31 DIAGNOSIS — K219 Gastro-esophageal reflux disease without esophagitis: Secondary | ICD-10-CM | POA: Diagnosis not present

## 2015-10-31 DIAGNOSIS — R05 Cough: Secondary | ICD-10-CM | POA: Diagnosis present

## 2015-10-31 LAB — CBG MONITORING, ED: Glucose-Capillary: 108 mg/dL — ABNORMAL HIGH (ref 65–99)

## 2015-10-31 MED ORDER — PREDNISONE 20 MG PO TABS
60.0000 mg | ORAL_TABLET | Freq: Once | ORAL | Status: AC
  Administered 2015-10-31: 60 mg via ORAL
  Filled 2015-10-31: qty 3

## 2015-10-31 MED ORDER — ALBUTEROL SULFATE (2.5 MG/3ML) 0.083% IN NEBU
5.0000 mg | INHALATION_SOLUTION | Freq: Once | RESPIRATORY_TRACT | Status: AC
  Administered 2015-10-31: 5 mg via RESPIRATORY_TRACT
  Filled 2015-10-31: qty 6

## 2015-10-31 MED ORDER — PREDNISONE 20 MG PO TABS: ORAL_TABLET | ORAL | Status: DC

## 2015-10-31 NOTE — Telephone Encounter (Signed)
Is this ok to refill?  

## 2015-10-31 NOTE — ED Provider Notes (Addendum)
CSN: [id number]     Arrival date & time 10/31/15  [id number] History   First MD Initiated Contact with Patient 10/31/15 [phone]     Chief Complaint  Patient presents with  . Asthma  . Cough     (Consider location/radiation/quality/duration/timing/severity/associated sxs/prior Treatment) HPI Complains of cough and shortness of breath onset 2 days ago. Cough is productive of clear and green sputum she denies fever. She is treated herself with her albuterol nebulizer and inhaler as well as her Spiriva inhaler, take well and ibuprofen without relief. She other associated complaints include wheezing, nasal congestion. No other associated symptoms. Nothing makes symptoms better or worse. Past Medical History  Diagnosis Date  . Hypertension   . GERD (gastroesophageal reflux disease)   . Migraines   . Hyperlipidemia   . Diabetes mellitus     vs impaired fasting glucose  . Asthma     2015 hospitaliation for asthma   Past Surgical History  Procedure Laterality Date  . Tubal ligation    . Cyst excision      left neck/postauricular region, benign   Family History  Problem Relation Age of Onset  . Diabetes Mother   . Hypertension Mother   . Aneurysm Mother   . Stroke Mother   . Cancer Mother     cervical cancer  . Cancer Maternal Aunt     breast  . Heart disease Neg Hx   . Cancer Cousin     breast/breast   Social History  Substance Use Topics  . Smoking status: Current Every Day Smoker -- 10 years    Types: Cigarettes  . Smokeless tobacco: Never Used  . Alcohol Use: Yes     Comment: Once a month.    OB History    Gravida Para Term Preterm AB TAB SAB Ectopic Multiple Living   6 3 3  3  2 1  3      Review of Systems  HENT: Positive for congestion.   Respiratory: Positive for cough, shortness of breath and wheezing.   Neurological: Positive for headaches.  All other systems reviewed and are negative.     Allergies  Pneumococcal vaccines and Vicodin  Home Medications    Prior to Admission medications   Medication Sig Start Date End Date Taking? Authorizing Provider  acetaminophen (TYLENOL) 500 MG tablet Take 1,000 mg by mouth every 6 (six) hours as needed for moderate pain or headache. Reported on 10/12/2015    Historical Provider, MD  acyclovir (ZOVIRAX) 200 MG capsule TAKE ONE CAPSULE BY MOUTH TWICE DAILY Patient not taking: Reported on 10/12/2015 10/04/15   Camelia Eng Tysinger, PA-C  albuterol (PROVENTIL HFA;VENTOLIN HFA) 108 (90 BASE) MCG/ACT inhaler Inhale 2 puffs into the lungs every 6 (six) hours as needed for wheezing. Patient not taking: Reported on 10/12/2015 06/01/15   Camelia Eng Tysinger, PA-C  albuterol (PROVENTIL) (2.5 MG/3ML) 0.083% nebulizer solution Take 3 mLs (2.5 mg total) by nebulization every 6 (six) hours as needed for wheezing or shortness of breath. Patient not taking: Reported on 10/12/2015 12/01/14   Camelia Eng Tysinger, PA-C  amLODipine (NORVASC) 5 MG tablet Take 1 tablet (5 mg total) by mouth daily. 12/01/14   Camelia Eng Tysinger, PA-C  amoxicillin (AMOXIL) 500 MG tablet Take 2 tablets (1,000 mg total) by mouth 2 (two) times daily. 10/12/15   Rita Ohara, MD  aspirin EC 81 MG tablet Take 1 tablet (81 mg total) by mouth daily. 12/01/14   Camelia Eng Tysinger, PA-C  atorvastatin (LIPITOR)  10 MG tablet Take 1 tablet (10 mg total) by mouth daily. 12/01/14   Camelia Eng Tysinger, PA-C  Fluticasone-Salmeterol (ADVAIR) 250-50 MCG/DOSE AEPB Inhale 1 puff into the lungs 2 (two) times daily.    Historical Provider, MD  ibuprofen (ADVIL,MOTRIN) 200 MG tablet Take 800 mg by mouth every 6 (six) hours as needed for moderate pain.    Historical Provider, MD  lisinopril-hydrochlorothiazide (PRINZIDE,ZESTORETIC) 20-25 MG per tablet Take 1 tablet by mouth daily. 12/01/14   Camelia Eng Tysinger, PA-C  metFORMIN (GLUCOPHAGE) 500 MG tablet Take 1 tablet (500 mg total) by mouth daily with breakfast. 12/01/14   Camelia Eng Tysinger, PA-C  omeprazole (PRILOSEC) 40 MG capsule Take 1 capsule (40 mg  total) by mouth daily. 06/01/15   Camelia Eng Tysinger, PA-C  ondansetron (ZOFRAN ODT) 4 MG disintegrating tablet Take 1 tablet (4 mg total) by mouth every 8 (eight) hours as needed for nausea or vomiting. Patient not taking: Reported on 10/12/2015 09/12/15   Rolland Porter, MD   BP 158/92 mmHg  Pulse 108  Temp(Src) 98 F (36.7 C) (Oral)  Resp 20  SpO2 96%  LMP 06/29/2015 (Exact Date) Physical Exam  Constitutional: She is oriented to person, place, and time. She appears well-developed and well-nourished. No distress.  HENT:  Head: Normocephalic and atraumatic.  Eyes: Conjunctivae are normal. Pupils are equal, round, and reactive to light.  Neck: Neck supple. No tracheal deviation present. No thyromegaly present.  Cardiovascular: Normal rate and regular rhythm.   No murmur heard. Pulmonary/Chest: Effort normal and breath sounds normal.  Coughing. Expiratory wheezes  Abdominal: Soft. Bowel sounds are normal. She exhibits no distension. There is no tenderness.  Obese  Musculoskeletal: Normal range of motion. She exhibits no edema or tenderness.  Neurological: She is alert and oriented to person, place, and time. No cranial nerve deficit. Coordination normal.  Gait normal  Skin: Skin is warm and dry. No rash noted.  Psychiatric: She has a normal mood and affect.  Nursing note and vitals reviewed.   ED Course  Procedures (including critical care time) Labs Review Labs Reviewed - No data to display  Imaging Review No results found. I have personally reviewed and evaluated these images and lab results as part of my medical decision-making.   EKG Interpretation None     Chest x-ray viewed by me. 8:05 AM feels improved after treatment with albuterol nebulizer and prednisone Results for orders placed or performed during the hospital encounter of 10/31/15  CBG monitoring, ED  Result Value Ref Range   Glucose-Capillary 108 (H) 65 - 99 mg/dL   Dg Chest 2 View  10/31/2015  CLINICAL DATA:   Shortness of breath and wheezing for 3 days. Chest tightness EXAM: CHEST  2 VIEW COMPARISON:  September 13, 2015 FINDINGS: Lungs are clear. Heart size and pulmonary vascularity are normal. No adenopathy. No bone lesions. No pneumothorax. IMPRESSION: No edema or consolidation. Electronically Signed   By: Lowella Grip III M.D.   On: 10/31/2015 07:32   I councilled pt for 5 minutes on smoking cessation MDM  Patient reports that she's benefited from prednisone in the past, and reports that prednisone does not elevate her blood sugar. I'll write prescription for prednisone. She is instructed to use her albuterol inhaler or nebulizer 4 hours as needed for shortness of breath. Robitussin for cough. Follow up with [facility] if not better in a week. Blood pressure rechecked in 3 weeks Diagnosis #1 asthmatic bronchitis #2 elevated blood pressure  Final diagnoses:  None    #3 tobacco abuse    Orlie Dakin, MD 10/31/15 [place], MD 10/31/15 [phone]

## 2015-10-31 NOTE — Discharge Instructions (Signed)
Asthma, Adult Using her albuterol inhaler or nebulizer every 4 hours as needed for shortness of breath or cough. Return if needed more than every 4 hours or see your physician at the [facility]. You can also use Robitussin as needed for cough. Start taking the prednisone prescribed tomorrow. You received today's dose here this morning. Prednisone may cause elevated blood sugar. Get your blood pressure rechecked within the next 3 weeks. Today's was mildly elevated at 145/94. Ask your primary care physician to help you to stop smoking. Asthma is a recurring condition in which the airways tighten and narrow. Asthma can make it difficult to breathe. It can cause coughing, wheezing, and shortness of breath. Asthma episodes, also called asthma attacks, range from minor to life-threatening. Asthma cannot be cured, but medicines and lifestyle changes can help control it. CAUSES Asthma is believed to be caused by inherited (genetic) and environmental factors, but its exact cause is unknown. Asthma may be triggered by allergens, lung infections, or irritants in the air. Asthma triggers are different for each person. Common triggers include:   Animal dander.  Dust mites.  Cockroaches.  Pollen from trees or grass.  Mold.  Smoke.  Air pollutants such as dust, household cleaners, hair sprays, aerosol sprays, paint fumes, strong chemicals, or strong odors.  Cold air, weather changes, and winds (which increase molds and pollens in the air).  Strong emotional expressions such as crying or laughing hard.  Stress.  Certain medicines (such as aspirin) or types of drugs (such as beta-blockers).  Sulfites in foods and drinks. Foods and drinks that may contain sulfites include dried fruit, potato chips, and sparkling grape juice.  Infections or inflammatory conditions such as the flu, a cold, or an inflammation of the nasal membranes (rhinitis).  Gastroesophageal reflux disease  (GERD).  Exercise or strenuous activity. SYMPTOMS Symptoms may occur immediately after asthma is triggered or many hours later. Symptoms include:  Wheezing.  Excessive nighttime or early morning coughing.  Frequent or severe coughing with a common cold.  Chest tightness.  Shortness of breath. DIAGNOSIS  The diagnosis of asthma is made by a review of your medical history and a physical exam. Tests may also be performed. These may include:  Lung function studies. These tests show how much air you breathe in and out.  Allergy tests.  Imaging tests such as X-rays. TREATMENT  Asthma cannot be cured, but it can usually be controlled. Treatment involves identifying and avoiding your asthma triggers. It also involves medicines. There are 2 classes of medicine used for asthma treatment:   Controller medicines. These prevent asthma symptoms from occurring. They are usually taken every day.  Reliever or rescue medicines. These quickly relieve asthma symptoms. They are used as needed and provide short-term relief. Your health care provider will help you create an asthma action plan. An asthma action plan is a written plan for managing and treating your asthma attacks. It includes a list of your asthma triggers and how they may be avoided. It also includes information on when medicines should be taken and when their dosage should be changed. An action plan may also involve the use of a device called a peak flow meter. A peak flow meter measures how well the lungs are working. It helps you monitor your condition. HOME CARE INSTRUCTIONS   Take medicines only as directed by your health care provider. Speak with your health care provider if you have questions about how or when to take the medicines.  Use a peak flow meter as directed by your health care provider. Record and keep track of readings.  Understand and use the action plan to help minimize or stop an asthma attack without needing to seek  medical care.  Control your home environment in the following ways to help prevent asthma attacks:  Do not smoke. Avoid being exposed to secondhand smoke.  Change your heating and air conditioning filter regularly.  Limit your use of fireplaces and wood stoves.  Get rid of pests (such as roaches and mice) and their droppings.  Throw away plants if you see mold on them.  Clean your floors and dust regularly. Use unscented cleaning products.  Try to have someone else vacuum for you regularly. Stay out of rooms while they are being vacuumed and for a short while afterward. If you vacuum, use a dust mask from a hardware store, a double-layered or microfilter vacuum cleaner bag, or a vacuum cleaner with a HEPA filter.  Replace carpet with wood, tile, or vinyl flooring. Carpet can trap dander and dust.  Use allergy-proof pillows, mattress covers, and box spring covers.  Wash bed sheets and blankets every week in hot water and dry them in a dryer.  Use blankets that are made of polyester or cotton.  Clean bathrooms and kitchens with bleach. If possible, have someone repaint the walls in these rooms with mold-resistant paint. Keep out of the rooms that are being cleaned and painted.  Wash hands frequently. SEEK MEDICAL CARE IF:   You have wheezing, shortness of breath, or a cough even if taking medicine to prevent attacks.  The colored mucus you cough up (sputum) is thicker than usual.  Your sputum changes from clear or white to yellow, green, gray, or bloody.  You have any problems that may be related to the medicines you are taking (such as a rash, itching, swelling, or trouble breathing).  You are using a reliever medicine more than 2-3 times per week.  Your peak flow is still at 50-79% of your personal best after following your action plan for 1 hour.  You have a fever. SEEK IMMEDIATE MEDICAL CARE IF:   You seem to be getting worse and are unresponsive to treatment during an  asthma attack.  You are short of breath even at rest.  You get short of breath when doing very little physical activity.  You have difficulty eating, drinking, or talking due to asthma symptoms.  You develop chest pain.  You develop a fast heartbeat.  You have a bluish color to your lips or fingernails.  You are light-headed, dizzy, or faint.  Your peak flow is less than 50% of your personal best.   This information is not intended to replace advice given to you by your health care provider. Make sure you discuss any questions you have with your health care provider.   Document Released: 10/14/2005 Document Revised: 07/05/2015 Document Reviewed: 05/13/2013 Elsevier Interactive Patient Education [employer].

## 2015-10-31 NOTE — ED Notes (Signed)
Patient reports she is experiencing a dry cough, wheezing, sore throat, rib cage pain, and headache. Symptoms started on Sunday. Took Ibuprofen 600mg , DayQuill, and Albuterol neb treatment this morning.

## 2015-11-27 ENCOUNTER — Telehealth: Payer: Self-pay | Admitting: Medical

## 2015-11-27 MED ORDER — METFORMIN HCL 500 MG PO TABS: 500.0000 mg | ORAL_TABLET | Freq: Every day | ORAL | Status: DC

## 2015-11-27 MED ORDER — AMLODIPINE BESYLATE 5 MG PO TABS: 5.0000 mg | ORAL_TABLET | Freq: Every day | ORAL | Status: DC

## 2015-11-27 NOTE — Telephone Encounter (Signed)
Requesting 90day refillon Amlodipine 5mg  and Metformin 500mg 

## 2015-11-27 NOTE — Telephone Encounter (Signed)
Refilled but need appointment for any more refills

## 2015-11-28 ENCOUNTER — Telehealth: Payer: Self-pay | Admitting: Medical

## 2015-11-28 MED ORDER — ATORVASTATIN CALCIUM 10 MG PO TABS: 10.0000 mg | ORAL_TABLET | Freq: Every day | ORAL | Status: DC

## 2015-11-28 NOTE — Telephone Encounter (Signed)
Rcvd refill request for Lipitor 10mg  #90

## 2015-11-28 NOTE — Telephone Encounter (Signed)
Refilled

## 2015-12-04 ENCOUNTER — Telehealth: Payer: Self-pay

## 2015-12-04 ENCOUNTER — Ambulatory Visit (INDEPENDENT_AMBULATORY_CARE_PROVIDER_SITE_OTHER): Payer: BLUE CROSS/BLUE SHIELD | Admitting: Medical

## 2015-12-04 ENCOUNTER — Encounter: Payer: Self-pay | Admitting: Medical

## 2015-12-04 VITALS — BP 126/82 | HR 91 | Ht 69.25 in | Wt 257.0 lb

## 2015-12-04 DIAGNOSIS — I10 Essential (primary) hypertension: Secondary | ICD-10-CM

## 2015-12-04 DIAGNOSIS — R7301 Impaired fasting glucose: Secondary | ICD-10-CM

## 2015-12-04 DIAGNOSIS — Z1239 Encounter for other screening for malignant neoplasm of breast: Secondary | ICD-10-CM

## 2015-12-04 DIAGNOSIS — J309 Allergic rhinitis, unspecified: Secondary | ICD-10-CM

## 2015-12-04 DIAGNOSIS — Z23 Encounter for immunization: Secondary | ICD-10-CM | POA: Diagnosis not present

## 2015-12-04 DIAGNOSIS — Z0000 Encounter for general adult medical examination without abnormal findings: Secondary | ICD-10-CM

## 2015-12-04 DIAGNOSIS — K219 Gastro-esophageal reflux disease without esophagitis: Secondary | ICD-10-CM | POA: Diagnosis not present

## 2015-12-04 DIAGNOSIS — M79671 Pain in right foot: Secondary | ICD-10-CM | POA: Diagnosis not present

## 2015-12-04 DIAGNOSIS — M79672 Pain in left foot: Secondary | ICD-10-CM

## 2015-12-04 DIAGNOSIS — J4540 Moderate persistent asthma, uncomplicated: Secondary | ICD-10-CM

## 2015-12-04 DIAGNOSIS — A6000 Herpesviral infection of urogenital system, unspecified: Secondary | ICD-10-CM

## 2015-12-04 DIAGNOSIS — E669 Obesity, unspecified: Secondary | ICD-10-CM | POA: Diagnosis not present

## 2015-12-04 DIAGNOSIS — Z1211 Encounter for screening for malignant neoplasm of colon: Secondary | ICD-10-CM

## 2015-12-04 DIAGNOSIS — E785 Hyperlipidemia, unspecified: Secondary | ICD-10-CM

## 2015-12-04 DIAGNOSIS — Z720 Tobacco use: Secondary | ICD-10-CM | POA: Diagnosis not present

## 2015-12-04 DIAGNOSIS — R102 Pelvic and perineal pain: Secondary | ICD-10-CM

## 2015-12-04 DIAGNOSIS — F17200 Nicotine dependence, unspecified, uncomplicated: Secondary | ICD-10-CM

## 2015-12-04 LAB — CBC WITH DIFFERENTIAL/PLATELET
Basophils Absolute: 0 10*3/uL (ref 0.0–0.1)
Basophils Relative: 1 % (ref 0–1)
Eosinophils Absolute: 0.2 10*3/uL (ref 0.0–0.7)
Eosinophils Relative: 5 % (ref 0–5)
HCT: 39.4 % (ref 36.0–46.0)
Hemoglobin: 12.2 g/dL (ref 12.0–15.0)
Lymphocytes Relative: 37 % (ref 12–46)
Lymphs Abs: 1.6 10*3/uL (ref 0.7–4.0)
MCH: 25.8 pg — ABNORMAL LOW (ref 26.0–34.0)
MCHC: 31 g/dL (ref 30.0–36.0)
MCV: 83.5 fL (ref 78.0–100.0)
MPV: 9.6 fL (ref 8.6–12.4)
Monocytes Absolute: 0.6 10*3/uL (ref 0.1–1.0)
Monocytes Relative: 14 % — ABNORMAL HIGH (ref 3–12)
Neutro Abs: 1.8 10*3/uL (ref 1.7–7.7)
Neutrophils Relative %: 43 % (ref 43–77)
Platelets: 332 10*3/uL (ref 150–400)
RBC: 4.72 MIL/uL (ref 3.87–5.11)
RDW: 18.2 % — ABNORMAL HIGH (ref 11.5–15.5)
WBC: 4.2 10*3/uL (ref 4.0–10.5)

## 2015-12-04 LAB — COMPREHENSIVE METABOLIC PANEL
ALT: 13 U/L (ref 6–29)
AST: 22 U/L (ref 10–35)
Albumin: 3.7 g/dL (ref 3.6–5.1)
Alkaline Phosphatase: 82 U/L (ref 33–130)
BUN: 11 mg/dL (ref 7–25)
CO2: 28 mmol/L (ref 20–31)
Calcium: 9 mg/dL (ref 8.6–10.4)
Chloride: 102 mmol/L (ref 98–110)
Creat: 0.53 mg/dL (ref 0.50–1.05)
Glucose, Bld: 87 mg/dL (ref 65–99)
Potassium: 3.4 mmol/L — ABNORMAL LOW (ref 3.5–5.3)
Sodium: 141 mmol/L (ref 135–146)
Total Bilirubin: 0.3 mg/dL (ref 0.2–1.2)
Total Protein: 7.6 g/dL (ref 6.1–8.1)

## 2015-12-04 LAB — HEMOGLOBIN A1C
Hgb A1c MFr Bld: 6.3 % — ABNORMAL HIGH (ref <5.7)
Mean Plasma Glucose: 134 mg/dL — ABNORMAL HIGH (ref <117)

## 2015-12-04 LAB — LIPID PANEL
Cholesterol: 145 mg/dL (ref 125–200)
HDL: 72 mg/dL (ref >=46)
LDL Cholesterol: 24 mg/dL (ref <130)
Total CHOL/HDL Ratio: 2 Ratio (ref <=5.0)
Triglycerides: 243 mg/dL — ABNORMAL HIGH (ref <150)
VLDL: 49 mg/dL — ABNORMAL HIGH (ref <30)

## 2015-12-04 MED ORDER — NICOTINE 21 MG/24HR TD PT24: 21.0000 mg | MEDICATED_PATCH | Freq: Every day | TRANSDERMAL | Status: DC

## 2015-12-04 MED ORDER — BUPROPION HCL ER (XL) 150 MG PO TB24: 150.0000 mg | ORAL_TABLET | Freq: Every day | ORAL | Status: DC

## 2015-12-04 MED ORDER — LISINOPRIL-HYDROCHLOROTHIAZIDE 20-25 MG PO TABS: 1.0000 | ORAL_TABLET | Freq: Every day | ORAL | Status: DC

## 2015-12-04 MED ORDER — AMLODIPINE BESYLATE 5 MG PO TABS: 5.0000 mg | ORAL_TABLET | Freq: Every day | ORAL | Status: DC

## 2015-12-04 MED ORDER — ACYCLOVIR 200 MG PO CAPS: 200.0000 mg | ORAL_CAPSULE | Freq: Two times a day (BID) | ORAL | Status: DC

## 2015-12-04 MED ORDER — SALICYLIC ACID 17 % EX GEL: Freq: Every day | CUTANEOUS | Status: DC

## 2015-12-04 NOTE — Progress Notes (Addendum)
Subjective:   HPI  Sydney Williams is a 50 y.o. female who presents for a complete physical.   Concerns: Asthma - doing fine, on Advair, 1 puff BID, albuterol occasionally  Does want to stop smoking.  Scared to use Chantix  Having left heel pain, has corn/callous.    Compliant with amlodipine and Lisinopril HCT daily without c/o  Compliant with Lipitor daily without c/o  Taking aspirin daily  Compliant with OTC Prilosec daily with good relief  Genital herpes - compliant with Acyclovir BID  Last menstrual period >  1 year ago.  No vaginal c/o, no bleeding, no discharge.    Having some left lower abdomen/pelvic pain mostly in the mornings.    Reviewed their medical, surgical, family, social, medication, and allergy history and updated chart as appropriate.  Past Medical History  Diagnosis Date  . Hypertension   . GERD (gastroesophageal reflux disease)   . Migraines   . Hyperlipidemia   . Diabetes mellitus     vs impaired fasting glucose  . Asthma     2015 hospitaliation for asthma  . Impaired fasting blood sugar   . History of respiratory failure   . Family history of breast cancer   . Obesity   . Genital herpes   . Smoker     Past Surgical History  Procedure Laterality Date  . Tubal ligation    . Cyst excision      left neck/postauricular region, benign    Social History   Social History  . Marital Status: Married    Spouse Name: N/A  . Number of Children: N/A  . Years of Education: N/A   Occupational History  . Not on file.   Social History Main Topics  . Smoking status: Current Every Day Smoker -- 11 years    Types: Cigarettes  . Smokeless tobacco: Never Used  . Alcohol Use: Yes     Comment: Once a month.   . Drug Use: No  . Sexual Activity: Yes    Birth Control/ Protection: Surgical   Other Topics Concern  . Not on file   Social History Narrative   Married, and her granddaughter lives with her, works as Quarry manager at [employer], exercise -  activity at work, walking.    Family History  Problem Relation Age of Onset  . Diabetes Mother   . Hypertension Mother   . Aneurysm Mother   . Stroke Mother   . Cancer Mother     cervical cancer  . Cancer Maternal Aunt     breast  . Heart disease Neg Hx   . Cancer Cousin     breast/breast     Current outpatient prescriptions:  .  acyclovir (ZOVIRAX) 200 MG capsule, Take 1 capsule (200 mg total) by mouth 2 (two) times daily., Disp: 180 capsule, Rfl: 3 .  albuterol (PROVENTIL HFA;VENTOLIN HFA) 108 (90 BASE) MCG/ACT inhaler, Inhale 2 puffs into the lungs every 6 (six) hours as needed for wheezing., Disp: 1 Inhaler, Rfl: 0 .  amLODipine (NORVASC) 5 MG tablet, Take 1 tablet (5 mg total) by mouth daily., Disp: 90 tablet, Rfl: 3 .  aspirin EC 81 MG tablet, Take 1 tablet (81 mg total) by mouth daily., Disp: 90 tablet, Rfl: 3 .  atorvastatin (LIPITOR) 10 MG tablet, Take 1 tablet (10 mg total) by mouth daily., Disp: 90 tablet, Rfl: 3 .  Fluticasone-Salmeterol (ADVAIR) 250-50 MCG/DOSE AEPB, Inhale 1 puff into the lungs 2 (two) times daily., Disp: , Rfl:  .  lisinopril-hydrochlorothiazide (PRINZIDE,ZESTORETIC) 20-25 MG tablet, Take 1 tablet by mouth daily., Disp: 90 tablet, Rfl: 3 .  metFORMIN (GLUCOPHAGE) 500 MG tablet, Take 1 tablet (500 mg total) by mouth daily with breakfast., Disp: 90 tablet, Rfl: 0 .  omeprazole (PRILOSEC) 40 MG capsule, Take 1 capsule (40 mg total) by mouth daily., Disp: 90 capsule, Rfl: 1 .  acetaminophen (TYLENOL) 500 MG tablet, Take 1,000 mg by mouth every 6 (six) hours as needed for moderate pain or headache. Reported on 12/04/2015, Disp: , Rfl:  .  albuterol (PROVENTIL) (2.5 MG/3ML) 0.083% nebulizer solution, Take 3 mLs (2.5 mg total) by nebulization every 6 (six) hours as needed for wheezing or shortness of breath. (Patient not taking: Reported on 12/04/2015), Disp: 75 mL, Rfl: 1 .  buPROPion (WELLBUTRIN XL) 150 MG 24 hr tablet, Take 1 tablet (150 mg total) by mouth  daily., Disp: 30 tablet, Rfl: 1 .  ibuprofen (ADVIL,MOTRIN) 200 MG tablet, Take 600-800 mg by mouth every 6 (six) hours as needed for moderate pain. Reported on 12/04/2015, Disp: , Rfl:  .  nicotine (NICODERM CQ - DOSED IN MG/24 HOURS) 21 mg/24hr patch, Place 1 patch (21 mg total) onto the skin daily., Disp: 28 patch, Rfl: 0 .  ondansetron (ZOFRAN ODT) 4 MG disintegrating tablet, Take 1 tablet (4 mg total) by mouth every 8 (eight) hours as needed for nausea or vomiting. (Patient not taking: Reported on 12/04/2015), Disp: 8 tablet, Rfl: 0 .  PE-DM-GG-APAP&PE-Doxyl-DM-APAP (VICKS DAYQUIL/NYQUIL SEVERE) LQPK, Take 1 Dose by mouth 3 (three) times daily as needed (cold symptoms). Reported on 12/04/2015, Disp: , Rfl:  .  salicylic acid 17 % gel, Apply topically daily., Disp: 15 g, Rfl: 0  Allergies  Allergen Reactions  . Pneumococcal Vaccines Swelling    Swelling at injection site   . Vicodin [Hydrocodone-Acetaminophen] Rash    Review of Systems Constitutional: -fever, -chills, -sweats,- weight change, -decreased appetite, -fatigue Allergy: -sneezing, -itching, -congestion Dermatology: -changing moles, --rash, -lumps ENT: -runny nose, -ear pain, -sore throat, -hoarseness, -sinus pain, -teeth pain, - ringing in ears, -hearing loss, -nosebleeds Cardiology: -chest pain, -palpitations, -swelling, -difficulty breathing when lying flat, -waking up short of breath Respiratory: -cough, -shortness of breath, -difficulty breathing with exercise or exertion, -wheezing, -coughing up blood Gastroenterology: +LLQ abdominal pain, -nausea, -vomiting, -diarrhea, -constipation, -blood in stool, -changes in bowel movement, -difficulty swallowing or eating Hematology: -bleeding, -bruising  Musculoskeletal: -joint aches, -muscle aches, -joint swelling, -back pain, -neck pain, -cramping, -changes in gait Ophthalmology: denies vision changes, eye redness, itching, discharge Urology: -burning with urination, -difficulty  urinating, -blood in urine, -urinary frequency, -urgency, -incontinence Neurology: -headache, -weakness, -tingling, -numbness, -memory loss, -falls, -dizziness Psychology: -depressed mood, -agitation, -sleep problems     Objective:   Physical Exam  BP 126/82 mmHg  Pulse 91  Ht 5' 9.25" (1.759 m)  Wt 257 lb (116.574 kg)  BMI 37.68 kg/m2  SpO2 97%  LMP 06/29/2015 (Exact Date)  Wt Readings from Last 3 Encounters:  12/04/15 257 lb (116.574 kg)  10/12/15 262 lb 9.6 oz (119.115 kg)  06/01/15 261 lb (118.389 kg)   BP Readings from Last 3 Encounters:  12/04/15 126/82  10/31/15 145/94  10/12/15 122/80   General appearance: alert, no distress, WD/WN, obese AA female Skin:  Tattoo right upper back "lady D", tattoo right upper chest, few scattered benign-appearing macules HEENT: normocephalic, conjunctiva/corneas normal, sclerae anicteric, PERRLA, EOMi, nares patent, no discharge or erythema, pharynx normal Oral cavity: MMM, tongue normal, teeth in normal repair Neck: supple,  no lymphadenopathy, no thyromegaly, no masses, normal ROM , no bruits Chest: non tender, normal shape and expansion Heart: RRR, normal S1, S2, no murmurs Lungs: CTA bilaterally, no wheezes, rhonchi, or rales Abdomen: +bs, soft, left lower quadrant tender, otherwise non tender, non distended, no masses, no hepatomegaly, no splenomegaly, no bruits Back: non tender, normal ROM, no scoliosis Musculoskeletal: upper extremities non tender, no obvious deformity, normal ROM throughout, lower extremities non tender, no obvious deformity, normal ROM throughout Extremities: no edema, no cyanosis, no clubbing Pulses: 2+ symmetric, upper and lower extremities, normal cap refill Neurological: alert, oriented x 3, CN2-12 intact, strength normal upper extremities and lower extremities, sensation normal throughout, DTRs 2+ throughout, no cerebellar signs, gait normal Psychiatric: normal affect, behavior normal, pleasant  Breast:  nontender, no masses or lumps, no skin changes, no nipple discharge or inversion, no axillary lymphadenopathy Gyn: Normal external genitalia without lesions, vagina with normal mucosa, cervix with abnormal appearance, whitish and grayish coloration, no cervical motion tenderness, no abnormal vaginal discharge.  Uterus and adnexa not enlarged, left adnexal tenderness, otherwise nontender, no masses.   Exam chaperoned by nurse. Rectal: anus normal appearing   Assessment and Plan :    Encounter Diagnoses  Name Primary?  . Encounter for health maintenance examination in adult Yes  . Gastroesophageal reflux disease without esophagitis   . Moderate persistent asthma, uncomplicated   . Hyperlipidemia   . Smoker   . Essential hypertension   . Genital herpes   . Obesity   . Allergic rhinitis, unspecified allergic rhinitis type   . Impaired fasting blood sugar   . Heel pain, bilateral   . Need for prophylactic vaccination and inoculation against influenza   . Screening for breast cancer   . Special screening for malignant neoplasms, colon   . Pelvic pain in female    Physical exam - discussed healthy lifestyle, diet, exercise, preventative care, vaccinations, and addressed their concerns.  Handout given. See your eye doctor yearly for routine vision care. See your dentist yearly for routine dental care including hygiene visits twice yearly. Referral for first colonoscopy Hypertension-continue current medication Impaired glucose- Continue metformin, need to work with diet, exercise, labs today Heel pain, corn - advised OTC compound W, can use donut pads Go for mammogram yearly Begin Wellbutrin to stop tobacco, discussed counseling, nicotine patches asthma - c/t Advair, albuterol prn Counseled on the influenza virus vaccine.  Vaccine information sheet given.  Influenza vaccine given after consent obtained. Pelvic pain - pending labs, consider pelvic US Follow-up pending labs  Sydney Williams was  seen today for annual exam.  Diagnoses and all orders for this visit:  Encounter for health maintenance examination in adult -     Comprehensive metabolic panel -     Lipid panel -     CBC with Differential/Platelet -     Hemoglobin A1c -     Microalbumin / creatinine urine ratio -     Ambulatory referral to Gastroenterology  Gastroesophageal reflux disease without esophagitis  Moderate persistent asthma, uncomplicated  Hyperlipidemia -     Lipid panel  Smoker  Essential hypertension -     Microalbumin / creatinine urine ratio  Genital herpes  Obesity  Allergic rhinitis, unspecified allergic rhinitis type  Impaired fasting blood sugar -     Hemoglobin A1c -     Microalbumin / creatinine urine ratio  Heel pain, bilateral  Need for prophylactic vaccination and inoculation against influenza -     Flu Vaccine QUAD 36+  mos IM  Screening for breast cancer  Special screening for malignant neoplasms, colon -     Ambulatory referral to Gastroenterology  Pelvic pain in female  Other orders -     acyclovir (ZOVIRAX) 200 MG capsule; Take 1 capsule (200 mg total) by mouth 2 (two) times daily. -     amLODipine (NORVASC) 5 MG tablet; Take 1 tablet (5 mg total) by mouth daily. -     lisinopril-hydrochlorothiazide (PRINZIDE,ZESTORETIC) 20-25 MG tablet; Take 1 tablet by mouth daily. -     buPROPion (WELLBUTRIN XL) 150 MG 24 hr tablet; Take 1 tablet (150 mg total) by mouth daily. -     nicotine (NICODERM CQ - DOSED IN MG/24 HOURS) 21 mg/24hr patch; Place 1 patch (21 mg total) onto the skin daily. -     salicylic acid 17 % gel; Apply topically daily.

## 2015-12-04 NOTE — Patient Instructions (Signed)
Recommendations:  We will refer you for your first colonoscopy  Get your flu shot yearly  Use OTC Compound W daily for the corn on the heel  Continue your current medications  We will call with lab results  I may end up sending you for pelvic ultrasound given the pelvic pain  Exercise regularly  Eat a healthy low fat diet See your eye doctor yearly for routine vision care. See your dentist yearly for routine dental care including hygiene visits twice yearly.

## 2015-12-04 NOTE — Telephone Encounter (Signed)
The [facility] pharmacy called and stated that they do not have the gel at all and wanted to know if we could switch to something they do have or not?

## 2015-12-04 NOTE — Telephone Encounter (Signed)
Disregard message as pharmacist found the cream

## 2015-12-04 NOTE — Telephone Encounter (Signed)
Anything ordered would have to be ordered and wouldn't be in until the next day

## 2015-12-04 NOTE — Telephone Encounter (Signed)
Can fill the original rx now pharmacist found the medication

## 2015-12-05 ENCOUNTER — Other Ambulatory Visit: Payer: Self-pay | Admitting: Medical

## 2015-12-05 LAB — MICROALBUMIN / CREATININE URINE RATIO
Creatinine, Urine: 112 mg/dL (ref 20–320)
Microalb Creat Ratio: 31 mcg/mg creat — ABNORMAL HIGH (ref <30)
Microalb, Ur: 3.5 mg/dL

## 2015-12-05 MED ORDER — LISINOPRIL 20 MG PO TABS: 20.0000 mg | ORAL_TABLET | Freq: Every day | ORAL | Status: DC

## 2015-12-05 MED ORDER — SIMVASTATIN 10 MG PO TABS: 10.0000 mg | ORAL_TABLET | Freq: Every day | ORAL | Status: DC

## 2015-12-05 MED ORDER — SPIRONOLACTONE 25 MG PO TABS: 25.0000 mg | ORAL_TABLET | Freq: Every day | ORAL | Status: DC

## 2015-12-06 ENCOUNTER — Telehealth: Payer: Self-pay

## 2015-12-06 DIAGNOSIS — R102 Pelvic and perineal pain: Secondary | ICD-10-CM

## 2015-12-06 NOTE — Telephone Encounter (Signed)
-----   Message from Carlena Hurl, PA-C sent at 12/05/2015 12:28 PM EST ----- Her labs show low potassium, high triglycerides, low LDL and still prediabetic.   Given the prediabetes, low LDL, high trig and low potassium, I want to make some medication changes:  STOP Lisinopril HCT Instead, begin plain Lisinopril 20 mg without the hydrochlorothiazide Begin Spironolactone.  This is a fluid pill that doesn't lose potassium and should work well for hypertension STOP Lipitor for cholesterol since LDL still remains low Begin Simvastatin for cholesterol instead (if she already picked up refills from yesterday, then change to these once her current cholesterol and BP medications run out)  I want to send her for pelvic ultrasound given pelvic tenderness on exam. Please schedule this.    Refer as planned for colonoscopy  Begin the wellbutrin for smoking cessation  I want her to make some weight loss goals, and work towards these goals through healthy diet and regular exercise  F/u in 2-3 weeks once ultrasound has been done.

## 2015-12-06 NOTE — Telephone Encounter (Signed)
Orders for u/s put in.scheulded for 12/12/15 1:10 with full bladder 32oz of water one hour prior. [address] location. Pt is aware

## 2015-12-08 ENCOUNTER — Ambulatory Visit: Payer: Self-pay

## 2015-12-08 ENCOUNTER — Other Ambulatory Visit: Payer: Self-pay | Admitting: Occupational Medicine

## 2015-12-08 ENCOUNTER — Other Ambulatory Visit: Payer: Self-pay

## 2015-12-08 DIAGNOSIS — M79641 Pain in right hand: Secondary | ICD-10-CM

## 2015-12-08 DIAGNOSIS — Z1231 Encounter for screening mammogram for malignant neoplasm of breast: Secondary | ICD-10-CM

## 2015-12-11 ENCOUNTER — Ambulatory Visit: Payer: Self-pay

## 2015-12-12 ENCOUNTER — Other Ambulatory Visit: Payer: Self-pay

## 2015-12-12 ENCOUNTER — Telehealth: Payer: Self-pay | Admitting: Medical

## 2015-12-12 MED ORDER — METFORMIN HCL 500 MG PO TABS: 500.0000 mg | ORAL_TABLET | Freq: Every day | ORAL | Status: DC

## 2015-12-12 NOTE — Telephone Encounter (Signed)
Rcvd refill request for Metformin 500mg  #90 from [facility] at [facility]  [place] on [address]

## 2015-12-12 NOTE — Telephone Encounter (Signed)
done

## 2015-12-13 ENCOUNTER — Telehealth: Payer: Self-pay

## 2015-12-13 NOTE — Telephone Encounter (Signed)
Pt called to go over what medications she was stopping and starting from her recent office visit. Went over it and pt voiced her understanding

## 2015-12-20 ENCOUNTER — Ambulatory Visit
Admission: RE | Admit: 2015-12-20 | Discharge: 2015-12-20 | Disposition: A | Discharge status: Home / Self Care | Payer: BLUE CROSS/BLUE SHIELD | Source: Ambulatory Visit

## 2015-12-20 ENCOUNTER — Ambulatory Visit
Admission: RE | Admit: 2015-12-20 | Discharge: 2015-12-20 | Disposition: A | Discharge status: Home / Self Care | Payer: BLUE CROSS/BLUE SHIELD | Source: Ambulatory Visit | Attending: Medical | Admitting: Medical

## 2015-12-20 DIAGNOSIS — Z1231 Encounter for screening mammogram for malignant neoplasm of breast: Secondary | ICD-10-CM

## 2016-01-05 ENCOUNTER — Emergency Department (HOSPITAL_COMMUNITY)
Admission: EM | Admit: 2016-01-05 | Discharge: 2016-01-05 | Disposition: A | Discharge status: Home / Self Care | Payer: BLUE CROSS/BLUE SHIELD | Attending: Emergency Medicine | Admitting: Emergency Medicine

## 2016-01-05 ENCOUNTER — Encounter (HOSPITAL_COMMUNITY): Payer: Self-pay | Admitting: Emergency Medicine

## 2016-01-05 ENCOUNTER — Emergency Department (HOSPITAL_COMMUNITY): Payer: BLUE CROSS/BLUE SHIELD

## 2016-01-05 DIAGNOSIS — J45909 Unspecified asthma, uncomplicated: Secondary | ICD-10-CM | POA: Insufficient documentation

## 2016-01-05 DIAGNOSIS — Z7982 Long term (current) use of aspirin: Secondary | ICD-10-CM | POA: Diagnosis not present

## 2016-01-05 DIAGNOSIS — Z79899 Other long term (current) drug therapy: Secondary | ICD-10-CM | POA: Diagnosis not present

## 2016-01-05 DIAGNOSIS — E785 Hyperlipidemia, unspecified: Secondary | ICD-10-CM | POA: Insufficient documentation

## 2016-01-05 DIAGNOSIS — I10 Essential (primary) hypertension: Secondary | ICD-10-CM | POA: Insufficient documentation

## 2016-01-05 DIAGNOSIS — F17210 Nicotine dependence, cigarettes, uncomplicated: Secondary | ICD-10-CM | POA: Insufficient documentation

## 2016-01-05 DIAGNOSIS — R1110 Vomiting, unspecified: Secondary | ICD-10-CM

## 2016-01-05 DIAGNOSIS — R197 Diarrhea, unspecified: Secondary | ICD-10-CM

## 2016-01-05 DIAGNOSIS — E669 Obesity, unspecified: Secondary | ICD-10-CM | POA: Diagnosis not present

## 2016-01-05 DIAGNOSIS — B349 Viral infection, unspecified: Secondary | ICD-10-CM | POA: Insufficient documentation

## 2016-01-05 DIAGNOSIS — K219 Gastro-esophageal reflux disease without esophagitis: Secondary | ICD-10-CM | POA: Insufficient documentation

## 2016-01-05 DIAGNOSIS — E119 Type 2 diabetes mellitus without complications: Secondary | ICD-10-CM | POA: Insufficient documentation

## 2016-01-05 DIAGNOSIS — G43909 Migraine, unspecified, not intractable, without status migrainosus: Secondary | ICD-10-CM | POA: Insufficient documentation

## 2016-01-05 DIAGNOSIS — Z7951 Long term (current) use of inhaled steroids: Secondary | ICD-10-CM | POA: Insufficient documentation

## 2016-01-05 DIAGNOSIS — Z7984 Long term (current) use of oral hypoglycemic drugs: Secondary | ICD-10-CM | POA: Insufficient documentation

## 2016-01-05 LAB — COMPREHENSIVE METABOLIC PANEL
ALT: 19 U/L (ref 14–54)
AST: 33 U/L (ref 15–41)
Albumin: 3.3 g/dL — ABNORMAL LOW (ref 3.5–5.0)
Alkaline Phosphatase: 81 U/L (ref 38–126)
Anion gap: 14 (ref 5–15)
BUN: 9 mg/dL (ref 6–20)
CO2: 21 mmol/L — ABNORMAL LOW (ref 22–32)
Calcium: 8.9 mg/dL (ref 8.9–10.3)
Chloride: 104 mmol/L (ref 101–111)
Creatinine, Ser: 0.61 mg/dL (ref 0.44–1.00)
GFR calc Af Amer: >60 mL/min (ref >60)
GFR calc non Af Amer: >60 mL/min (ref >60)
Glucose, Bld: 101 mg/dL — ABNORMAL HIGH (ref 65–99)
Potassium: 4.2 mmol/L (ref 3.5–5.1)
Sodium: 139 mmol/L (ref 135–145)
Total Bilirubin: 1 mg/dL (ref 0.3–1.2)
Total Protein: 7.4 g/dL (ref 6.5–8.1)

## 2016-01-05 LAB — CBC
HCT: 38.4 % (ref 36.0–46.0)
Hemoglobin: 12.2 g/dL (ref 12.0–15.0)
MCH: 26.5 pg (ref 26.0–34.0)
MCHC: 31.8 g/dL (ref 30.0–36.0)
MCV: 83.3 fL (ref 78.0–100.0)
Platelets: 274 10*3/uL (ref 150–400)
RBC: 4.61 MIL/uL (ref 3.87–5.11)
RDW: 19.6 % — ABNORMAL HIGH (ref 11.5–15.5)
WBC: 4.8 10*3/uL (ref 4.0–10.5)

## 2016-01-05 LAB — URINALYSIS, ROUTINE W REFLEX MICROSCOPIC
Bilirubin Urine: NEGATIVE
Glucose, UA: NEGATIVE mg/dL
Hgb urine dipstick: NEGATIVE
Ketones, ur: NEGATIVE mg/dL
Leukocytes, UA: NEGATIVE
Nitrite: NEGATIVE
Protein, ur: NEGATIVE mg/dL
Specific Gravity, Urine: 1.015 (ref 1.005–1.030)
pH: 5 (ref 5.0–8.0)

## 2016-01-05 LAB — LIPASE, BLOOD: Lipase: 35 U/L (ref 11–51)

## 2016-01-05 MED ORDER — BENZONATATE 100 MG PO CAPS: 100.0000 mg | ORAL_CAPSULE | Freq: Three times a day (TID) | ORAL | Status: DC | PRN

## 2016-01-05 MED ORDER — ONDANSETRON 4 MG PO TBDP
ORAL_TABLET | ORAL | Status: AC
  Filled 2016-01-05: qty 1

## 2016-01-05 MED ORDER — ONDANSETRON 4 MG PO TBDP
4.0000 mg | ORAL_TABLET | Freq: Once | ORAL | Status: AC | PRN
  Administered 2016-01-05: 4 mg via ORAL

## 2016-01-05 MED ORDER — ONDANSETRON 4 MG PO TBDP: 4.0000 mg | ORAL_TABLET | Freq: Three times a day (TID) | ORAL | Status: DC | PRN

## 2016-01-05 MED ORDER — IBUPROFEN 200 MG PO TABS
600.0000 mg | ORAL_TABLET | Freq: Once | ORAL | Status: AC
Start: 2016-01-05 — End: 2016-01-05
  Administered 2016-01-05: 600 mg via ORAL
  Filled 2016-01-05: qty 3

## 2016-01-05 NOTE — ED Notes (Signed)
Patient transported to X-ray 

## 2016-01-05 NOTE — ED Notes (Signed)
Pt. presents with multiple symptoms onset this week : emesis , diarrhea , headache , chills , body aches , productive cough and runny nose.

## 2016-01-05 NOTE — ED Provider Notes (Signed)
CSN: [id number]     Arrival date & time 01/05/16  [id number] History   First MD Initiated Contact with Patient 01/05/16 0759     Chief Complaint  Patient presents with  . Emesis  . Diarrhea  . Chills  . Generalized Body Aches  . Cough     (Consider location/radiation/quality/duration/timing/severity/associated sxs/prior Treatment) HPI Comments: 50yo F w/ PMH including HTN, T2DM, GERD, HLD who p/w cough, vomiting, diarrhea, and body aches. Patient states that 3 days ago she began feeling unwell with vomiting, nonbloody diarrhea, headache, chills, body aches, runny nose, and cough productive of green phlegm. Last episode of vomiting was earlier today when she arrived to the ED. She denies any fevers, urinary symptoms or abdominal pain. 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   Patient is a 50 y.o. female presenting with vomiting, diarrhea, and cough. The history is provided by the patient.  Emesis Associated symptoms: diarrhea   Diarrhea Associated symptoms: vomiting   Cough   Past Medical History  Diagnosis Date  . Hypertension   . GERD (gastroesophageal reflux disease)   . Migraines   . Hyperlipidemia   . Diabetes mellitus     vs impaired fasting glucose  . Asthma     2015 hospitaliation for asthma  . Impaired fasting blood sugar   . History of respiratory failure   . Family history of breast cancer   . Obesity   . Genital herpes   . Smoker    Past Surgical History  Procedure Laterality Date  . Tubal ligation    . Cyst excision      left neck/postauricular region, benign   Family History  Problem Relation Age of Onset  . Diabetes Mother   . Hypertension Mother   . Aneurysm Mother   . Stroke Mother   . Cancer Mother     cervical cancer  . Cancer Maternal Aunt     breast  . Heart disease Neg Hx   . Cancer Cousin     breast/breast   Social History  Substance Use Topics  . Smoking status: Current Every Day Smoker -- 11 years     Types: Cigarettes  . Smokeless tobacco: Never Used  . Alcohol Use: Yes     Comment: Once a month.    OB History    Gravida Para Term Preterm AB TAB SAB Ectopic Multiple Living   6 3 3  3  2 1  3      Review of Systems  Respiratory: Positive for cough.   Gastrointestinal: Positive for vomiting and diarrhea.   10 Systems reviewed and are negative for acute change except as noted in the HPI.    Allergies  Pneumococcal vaccines and Vicodin  Home Medications   Prior to Admission medications   Medication Sig Start Date End Date Taking? Authorizing Provider  acetaminophen (TYLENOL) 500 MG tablet Take 1,000 mg by mouth every 6 (six) hours as needed for moderate pain or headache. Reported on 12/04/2015   Yes Historical Provider, MD  acyclovir (ZOVIRAX) 200 MG capsule Take 1 capsule (200 mg total) by mouth 2 (two) times daily. 12/04/15  Yes Camelia Eng Tysinger, PA-C  albuterol (PROVENTIL HFA;VENTOLIN HFA) 108 (90 BASE) MCG/ACT inhaler Inhale 2 puffs into the lungs every 6 (six) hours as needed for wheezing. 06/01/15  Yes Camelia Eng Tysinger, PA-C  albuterol (PROVENTIL) (2.5 MG/3ML) 0.083% nebulizer solution Take 3 mLs (2.5 mg total) by nebulization every 6 (six) hours  as needed for wheezing or shortness of breath. Patient taking differently: Take 2.5 mg by nebulization every 6 (six) hours as needed for wheezing or shortness of breath. For shortness of breath 12/01/14  Yes Camelia Eng Tysinger, PA-C  amLODipine (NORVASC) 5 MG tablet Take 1 tablet (5 mg total) by mouth daily. 12/04/15  Yes Camelia Eng Tysinger, PA-C  aspirin EC 81 MG tablet Take 1 tablet (81 mg total) by mouth daily. 12/01/14  Yes Camelia Eng Tysinger, PA-C  buPROPion (WELLBUTRIN XL) 150 MG 24 hr tablet Take 1 tablet (150 mg total) by mouth daily. 12/04/15  Yes Camelia Eng Tysinger, PA-C  Fluticasone-Salmeterol (ADVAIR) 250-50 MCG/DOSE AEPB Inhale 1 puff into the lungs 2 (two) times daily.   Yes Historical Provider, MD  ibuprofen (ADVIL,MOTRIN) 200 MG tablet Take  600-800 mg by mouth every 6 (six) hours as needed for moderate pain. Reported on 12/04/2015   Yes Historical Provider, MD  lisinopril (PRINIVIL,ZESTRIL) 20 MG tablet Take 1 tablet (20 mg total) by mouth daily. 12/05/15  Yes Camelia Eng Tysinger, PA-C  metFORMIN (GLUCOPHAGE) 500 MG tablet Take 1 tablet (500 mg total) by mouth daily with breakfast. 12/12/15  Yes Camelia Eng Tysinger, PA-C  omeprazole (PRILOSEC) 40 MG capsule Take 1 capsule (40 mg total) by mouth daily. 06/01/15  Yes Camelia Eng Tysinger, PA-C  simvastatin (ZOCOR) 10 MG tablet Take 1 tablet (10 mg total) by mouth daily. 12/05/15  Yes Camelia Eng Tysinger, PA-C  spironolactone (ALDACTONE) 25 MG tablet Take 1 tablet (25 mg total) by mouth daily. 12/05/15  Yes Camelia Eng Tysinger, PA-C  benzonatate (TESSALON) 100 MG capsule Take 1 capsule (100 mg total) by mouth 3 (three) times daily as needed for cough. 01/05/16   Sharlett Iles, MD  nicotine (NICODERM CQ - DOSED IN MG/24 HOURS) 21 mg/24hr patch Place 1 patch (21 mg total) onto the skin daily. Patient not taking: Reported on 01/05/2016 12/04/15   Camelia Eng Tysinger, PA-C  ondansetron (ZOFRAN ODT) 4 MG disintegrating tablet Take 1 tablet (4 mg total) by mouth every 8 (eight) hours as needed for nausea or vomiting. 01/05/16   Sharlett Iles, MD  PE-DM-GG-APAP&PE-Doxyl-DM-APAP (VICKS DAYQUIL/NYQUIL SEVERE) LQPK Take 1 Dose by mouth 3 (three) times daily as needed (cold symptoms). Reported on 12/04/2015    Historical Provider, MD   BP 147/92 mmHg  Pulse 89  Temp(Src) 98.6 F (37 C) (Oral)  Resp 16  Ht 5\' 11"  (1.803 m)  Wt 260 lb (117.935 kg)  BMI 36.28 kg/m2  SpO2 100%  LMP 06/29/2015 (Exact Date) Physical Exam  Constitutional: She is oriented to person, place, and time. She appears well-developed and well-nourished. No distress.  HENT:  Head: Normocephalic and atraumatic.  Moist mucous membranes  Eyes: Conjunctivae are normal. Pupils are equal, round, and reactive to light.  Neck: Neck supple.   Cardiovascular: Normal rate, regular rhythm and normal heart sounds.   No murmur heard. Pulmonary/Chest: Effort normal and breath sounds normal.  Abdominal: Soft. Bowel sounds are normal. She exhibits no distension. There is no tenderness.  Musculoskeletal: She exhibits no edema.  Neurological: She is alert and oriented to person, place, and time.  Fluent speech  Skin: Skin is warm and dry.  Psychiatric: She has a normal mood and affect. Judgment normal.  Nursing note and vitals reviewed.   ED Course  Procedures (including critical care time) Labs Review Labs Reviewed  COMPREHENSIVE METABOLIC PANEL - Abnormal; Notable for the following:    CO2 21 (*)  Glucose, Bld 101 (*)    Albumin 3.3 (*)    All other components within normal limits  CBC - Abnormal; Notable for the following:    RDW 19.6 (*)    All other components within normal limits  LIPASE, BLOOD  URINALYSIS, ROUTINE W REFLEX MICROSCOPIC (NOT AT [facility])    Imaging Review Dg Chest 2 View  01/05/2016  CLINICAL DATA:  Productive cough for 4 days, chills, fatigue, hypertension, diabetes mellitus, asthma, smoker EXAM: CHEST  2 VIEW COMPARISON:  10/31/2015 FINDINGS: Normal heart size, mediastinal contours, and pulmonary vascularity. Minimal chronic peribronchial thickening. Lungs otherwise clear. No pleural effusion or pneumothorax. Small RIGHT cervical rib. Bones otherwise unremarkable. IMPRESSION: No acute abnormalities. Electronically Signed   By: Lavonia Dana M.D.   On: 01/05/2016 08:52   I have personally reviewed and evaluated these lab results as part of my medical decision-making.   EKG Interpretation None     Medications  ondansetron (ZOFRAN-ODT) disintegrating tablet 4 mg (4 mg Oral Given 01/05/16 0656)  ibuprofen (ADVIL,MOTRIN) tablet 600 mg (600 mg Oral Given 01/05/16 0839)    MDM   Final diagnoses:  Viral syndrome  Vomiting and diarrhea   Pt p/w 3 days of viral symptoms including vomiting that has  continued this morning. On exam, she was well appearing w/ reassuring VS. Normal WOB and no abnormal lung sounds. Gave zofran ODT and obtained above labs which were reassuring against dehydration. WBC normal. CXR showed no acute findings.  PO challenged in ED; pt able to tolerate water with no problems. She was comfortable and well appearing on re-examination. Her symptoms are c/w a viral syndrome. I discussed supportive care for her viral symptoms including continued hydration, Tylenol/Motrin as needed, and f/u w/ PCP if symptoms worsening/persistent. Return precautions reviewed and patient voiced understanding. Patient discharged in satisfactory condition   Sharlett Iles, MD 01/05/16 [id number]

## 2016-01-08 ENCOUNTER — Encounter: Payer: Self-pay | Admitting: Internal Medicine

## 2016-02-12 ENCOUNTER — Other Ambulatory Visit: Payer: Self-pay | Admitting: Medical

## 2016-02-12 NOTE — Telephone Encounter (Signed)
Is this ok to refill?  

## 2016-02-26 ENCOUNTER — Ambulatory Visit (AMBULATORY_SURGERY_CENTER): Payer: Self-pay

## 2016-02-26 VITALS — Ht 71.0 in | Wt 257.8 lb

## 2016-02-26 DIAGNOSIS — Z1211 Encounter for screening for malignant neoplasm of colon: Secondary | ICD-10-CM

## 2016-02-26 NOTE — Progress Notes (Signed)
No allergies to eggs or soy No past problems with anesthesia No home oxygen No diet meds  No internet 

## 2016-02-29 ENCOUNTER — Encounter: Payer: Self-pay | Admitting: Internal Medicine

## 2016-03-05 ENCOUNTER — Other Ambulatory Visit: Payer: Self-pay | Admitting: Medical

## 2016-03-11 ENCOUNTER — Encounter: Payer: Self-pay | Admitting: Internal Medicine

## 2016-03-13 ENCOUNTER — Encounter: Payer: Self-pay | Admitting: Internal Medicine

## 2016-03-13 ENCOUNTER — Other Ambulatory Visit: Payer: Self-pay | Admitting: Medical

## 2016-03-13 ENCOUNTER — Ambulatory Visit (AMBULATORY_SURGERY_CENTER): Payer: BLUE CROSS/BLUE SHIELD | Admitting: Internal Medicine

## 2016-03-13 VITALS — BP 155/86 | HR 88 | Temp 98.6°F | Resp 13 | Ht 69.0 in | Wt 257.0 lb

## 2016-03-13 DIAGNOSIS — Z1211 Encounter for screening for malignant neoplasm of colon: Secondary | ICD-10-CM | POA: Diagnosis not present

## 2016-03-13 HISTORY — PX: COLONOSCOPY: SHX174

## 2016-03-13 LAB — GLUCOSE, CAPILLARY
Glucose-Capillary: 103 mg/dL — ABNORMAL HIGH (ref 65–99)
Glucose-Capillary: 91 mg/dL (ref 65–99)

## 2016-03-13 MED ORDER — SODIUM CHLORIDE 0.9 % IV SOLN: 500.0000 mL | INTRAVENOUS | Status: DC

## 2016-03-13 NOTE — Op Note (Signed)
[place] Patient Name: Sydney Williams Procedure Date: 03/13/2016 1:58 PM MRN: [id number] Endoscopist: Gatha Mayer , MD Age: 50 Referring MD:  Date of Birth: 25-Feb-1966 Gender: Female Procedure:                Colonoscopy Indications:              Screening for colorectal malignant neoplasm, This                            is the patient's first colonoscopy Medicines:                Propofol per Anesthesia, Monitored Anesthesia Care Procedure:                Pre-Anesthesia Assessment:                           - Prior to the procedure, a History and Physical                            was performed, and patient medications and                            allergies were reviewed. The patient's tolerance of                            previous anesthesia was also reviewed. The risks                            and benefits of the procedure and the sedation                            options and risks were discussed with the patient.                            All questions were answered, and informed consent                            was obtained. Prior Anticoagulants: The patient has                            taken no previous anticoagulant or antiplatelet                            agents. ASA Grade Assessment: II - A patient with                            mild systemic disease. After reviewing the risks                            and benefits, the patient was deemed in                            satisfactory condition to undergo the procedure.  After obtaining informed consent, the colonoscope                            was passed under direct vision. Throughout the                            procedure, the patient's blood pressure, pulse, and                            oxygen saturations were monitored continuously. The                            Model PCF-H190L [id number]) scope was introduced                            through the anus and  advanced to the the cecum,                            identified by appendiceal orifice and ileocecal                            valve. The ileocecal valve, appendiceal orifice,                            and rectum were photographed. The quality of the                            bowel preparation was good. The colonoscopy was                            performed without difficulty. The patient tolerated                            the procedure well. The bowel preparation used was                            Miralax. Scope In: 2:08:50 PM Scope Out: 2:24:37 PM Scope Withdrawal Time: 0 hours 10 minutes 13 seconds  Total Procedure Duration: 0 hours 15 minutes 47 seconds  Findings:                 The colon (entire examined portion) appeared normal.                           The perianal and digital rectal examinations were                            normal. Complications:            No immediate complications. Estimated blood loss:                            None. Estimated Blood Loss:     Estimated blood loss: none. Impression:               - The entire examined colon is normal.                           -  No specimens collected. Recommendation:           - Repeat colonoscopy in 10 years for screening                            purposes.                           - Patient has a contact number available for                            emergencies. The signs and symptoms of potential                            delayed complications were discussed with the                            patient. Return to normal activities tomorrow.                            Written discharge instructions were provided to the                            patient.                           - Resume previous diet.                           - Continue present medications. Gatha Mayer, MD 03/13/2016 2:33:23 PM This report has been signed electronically.

## 2016-03-13 NOTE — Progress Notes (Signed)
A/ox3, pleased with MAC, report to RN 

## 2016-03-13 NOTE — Progress Notes (Signed)
No problems noted in the recovery room. maw 

## 2016-03-13 NOTE — Patient Instructions (Addendum)
The colonoscopy was normal.  Next routine colonoscopy in 10 years - 2027  I appreciate the opportunity to care for you. Gatha Mayer, MD, FACG     YOU HAD AN ENDOSCOPIC PROCEDURE TODAY AT [facility]:   Refer to the procedure report that was given to you for any specific questions about what was found during the examination.  If the procedure report does not answer your questions, please call your gastroenterologist to clarify.  If you requested that your care partner not be given the details of your procedure findings, then the procedure report has been included in a sealed envelope for you to review at your convenience later.  YOU SHOULD EXPECT: Some feelings of bloating in the abdomen. Passage of more gas than usual.  Walking can help get rid of the air that was put into your GI tract during the procedure and reduce the bloating. If you had a lower endoscopy (such as a colonoscopy or flexible sigmoidoscopy) you may notice spotting of blood in your stool or on the toilet paper. If you underwent a bowel prep for your procedure, you may not have a normal bowel movement for a few days.  Please Note:  You might notice some irritation and congestion in your nose or some drainage.  This is from the oxygen used during your procedure.  There is no need for concern and it should clear up in a day or so.  SYMPTOMS TO REPORT IMMEDIATELY:   Following lower endoscopy (colonoscopy or flexible sigmoidoscopy):  Excessive amounts of blood in the stool  Significant tenderness or worsening of abdominal pains  Swelling of the abdomen that is new, acute  Fever of 100F or higher   For urgent or emergent issues, a gastroenterologist can be reached at any hour by calling [phone].   DIET: Your first meal following the procedure should be a small meal and then it is ok to progress to your normal diet. Heavy or fried foods are harder to digest and may make you feel nauseous  or bloated.  Likewise, meals heavy in dairy and vegetables can increase bloating.  Drink plenty of fluids but you should avoid alcoholic beverages for 24 hours.  ACTIVITY:  You should plan to take it easy for the rest of today and you should NOT DRIVE or use heavy machinery until tomorrow (because of the sedation medicines used during the test).    FOLLOW UP: Our staff will call the number listed on your records the next business day following your procedure to check on you and address any questions or concerns that you may have regarding the information given to you following your procedure. If we do not reach you, we will leave a message.  However, if you are feeling well and you are not experiencing any problems, there is no need to return our call.  We will assume that you have returned to your regular daily activities without incident.  If any biopsies were taken you will be contacted by phone or by letter within the next 1-3 weeks.  Please call us at [phone] if you have not heard about the biopsies in 3 weeks.    SIGNATURES/CONFIDENTIALITY: You and/or your care partner have signed paperwork which will be entered into your electronic medical record.  These signatures attest to the fact that that the information above on your After Visit Summary has been reviewed and is understood.  Full responsibility of the confidentiality of this discharge  information lies with you and/or your care-partner.    You may resume your current medications today. Your blood sugar was 91 in the recovery room. Please call if any questions or concerns.

## 2016-03-14 ENCOUNTER — Telehealth: Payer: Self-pay

## 2016-03-14 NOTE — Telephone Encounter (Signed)
  Follow up Call-  Call back number 03/13/2016  Post procedure Call Back phone  # [phone]  Permission to leave phone message Yes     Patient questions:  Do you have a fever, pain , or abdominal swelling? No. Pain Score  0 *  Have you tolerated food without any problems? Yes.    Have you been able to return to your normal activities? Yes.    Do you have any questions about your discharge instructions: Diet   No. Medications  No. Follow up visit  No.  Do you have questions or concerns about your Care? No.  Actions: * If pain score is 4 or above: No action needed, pain <4.

## 2016-04-01 ENCOUNTER — Inpatient Hospital Stay (HOSPITAL_COMMUNITY): Payer: BLUE CROSS/BLUE SHIELD

## 2016-04-01 ENCOUNTER — Inpatient Hospital Stay (HOSPITAL_COMMUNITY)
Admission: AD | Admit: 2016-04-01 | Discharge: 2016-04-01 | Disposition: A | Discharge status: Home / Self Care | Payer: BLUE CROSS/BLUE SHIELD | Source: Ambulatory Visit | Attending: Obstetrics & Gynecology | Admitting: Obstetrics & Gynecology

## 2016-04-01 ENCOUNTER — Encounter (HOSPITAL_COMMUNITY): Payer: Self-pay | Admitting: *Deleted

## 2016-04-01 DIAGNOSIS — I10 Essential (primary) hypertension: Secondary | ICD-10-CM | POA: Diagnosis not present

## 2016-04-01 DIAGNOSIS — D259 Leiomyoma of uterus, unspecified: Secondary | ICD-10-CM

## 2016-04-01 DIAGNOSIS — R102 Pelvic and perineal pain: Secondary | ICD-10-CM | POA: Diagnosis present

## 2016-04-01 DIAGNOSIS — K219 Gastro-esophageal reflux disease without esophagitis: Secondary | ICD-10-CM | POA: Insufficient documentation

## 2016-04-01 DIAGNOSIS — N83202 Unspecified ovarian cyst, left side: Secondary | ICD-10-CM

## 2016-04-01 DIAGNOSIS — R109 Unspecified abdominal pain: Secondary | ICD-10-CM

## 2016-04-01 DIAGNOSIS — F17210 Nicotine dependence, cigarettes, uncomplicated: Secondary | ICD-10-CM | POA: Diagnosis not present

## 2016-04-01 DIAGNOSIS — N390 Urinary tract infection, site not specified: Secondary | ICD-10-CM | POA: Diagnosis not present

## 2016-04-01 DIAGNOSIS — E119 Type 2 diabetes mellitus without complications: Secondary | ICD-10-CM | POA: Diagnosis not present

## 2016-04-01 DIAGNOSIS — D250 Submucous leiomyoma of uterus: Secondary | ICD-10-CM | POA: Insufficient documentation

## 2016-04-01 HISTORY — DX: Benign neoplasm of connective and other soft tissue, unspecified: D21.9

## 2016-04-01 LAB — URINE MICROSCOPIC-ADD ON

## 2016-04-01 LAB — URINALYSIS, ROUTINE W REFLEX MICROSCOPIC
Bilirubin Urine: NEGATIVE
Glucose, UA: NEGATIVE mg/dL
Ketones, ur: NEGATIVE mg/dL
Nitrite: POSITIVE — AB
Protein, ur: 30 mg/dL — AB
Specific Gravity, Urine: 1.015 (ref 1.005–1.030)
pH: 6 (ref 5.0–8.0)

## 2016-04-01 LAB — POCT PREGNANCY, URINE: Preg Test, Ur: NEGATIVE

## 2016-04-01 MED ORDER — SULFAMETHOXAZOLE-TRIMETHOPRIM 800-160 MG PO TABS: 1.0000 | ORAL_TABLET | Freq: Two times a day (BID) | ORAL | Status: AC

## 2016-04-01 MED ORDER — KETOROLAC TROMETHAMINE 60 MG/2ML IM SOLN
60.0000 mg | Freq: Once | INTRAMUSCULAR | Status: AC
  Administered 2016-04-01: 60 mg via INTRAMUSCULAR
  Filled 2016-04-01: qty 2

## 2016-04-01 MED ORDER — TRAMADOL HCL 50 MG PO TABS
50.0000 mg | ORAL_TABLET | Freq: Once | ORAL | Status: AC
  Administered 2016-04-01: 50 mg via ORAL
  Filled 2016-04-01: qty 1

## 2016-04-01 MED ORDER — TRAMADOL HCL 50 MG PO TABS: 50.0000 mg | ORAL_TABLET | Freq: Four times a day (QID) | ORAL | Status: DC | PRN

## 2016-04-01 NOTE — Discharge Instructions (Signed)
Ovarian Cyst An ovarian cyst is a fluid-filled sac that forms on an ovary. The ovaries are small organs that produce eggs in women. Various types of cysts can form on the ovaries. Most are not cancerous. Many do not cause problems, and they often go away on their own. Some may cause symptoms and require treatment. Common types of ovarian cysts include:  Functional cysts--These cysts may occur every month during the menstrual cycle. This is normal. The cysts usually go away with the next menstrual cycle if the woman does not get pregnant. Usually, there are no symptoms with a functional cyst.  Endometrioma cysts--These cysts form from the tissue that lines the uterus. They are also called "chocolate cysts" because they become filled with blood that turns brown. This type of cyst can cause pain in the lower abdomen during intercourse and with your menstrual period.  Cystadenoma cysts--This type develops from the cells on the outside of the ovary. These cysts can get very big and cause lower abdomen pain and pain with intercourse. This type of cyst can twist on itself, cut off its blood supply, and cause severe pain. It can also easily rupture and cause a lot of pain.  Dermoid cysts--This type of cyst is sometimes found in both ovaries. These cysts may contain different kinds of body tissue, such as skin, teeth, hair, or cartilage. They usually do not cause symptoms unless they get very big.  Theca lutein cysts--These cysts occur when too much of a certain hormone (human chorionic gonadotropin) is produced and overstimulates the ovaries to produce an egg. This is most common after procedures used to assist with the conception of a baby (in vitro fertilization). CAUSES   Fertility drugs can cause a condition in which multiple large cysts are formed on the ovaries. This is called ovarian hyperstimulation syndrome.  A condition called polycystic ovary syndrome can cause hormonal imbalances that can lead to  nonfunctional ovarian cysts. SIGNS AND SYMPTOMS  Many ovarian cysts do not cause symptoms. If symptoms are present, they may include:  Pelvic pain or pressure.  Pain in the lower abdomen.  Pain during sexual intercourse.  Increasing girth (swelling) of the abdomen.  Abnormal menstrual periods.  Increasing pain with menstrual periods.  Stopping having menstrual periods without being pregnant. DIAGNOSIS  These cysts are commonly found during a routine or annual pelvic exam. Tests may be ordered to find out more about the cyst. These tests may include:  Ultrasound.  X-ray of the pelvis.  CT scan.  MRI.  Blood tests. TREATMENT  Many ovarian cysts go away on their own without treatment. Your health care provider may want to check your cyst regularly for 2-3 months to see if it changes. For women in menopause, it is particularly important to monitor a cyst closely because of the higher rate of ovarian cancer in menopausal women. When treatment is needed, it may include any of the following:  A procedure to drain the cyst (aspiration). This may be done using a long needle and ultrasound. It can also be done through a laparoscopic procedure. This involves using a thin, lighted tube with a tiny camera on the end (laparoscope) inserted through a small incision.  Surgery to remove the whole cyst. This may be done using laparoscopic surgery or an open surgery involving a larger incision in the lower abdomen.  Hormone treatment or birth control pills. These methods are sometimes used to help dissolve a cyst. HOME CARE INSTRUCTIONS   Only take over-the-counter  or prescription medicines as directed by your health care provider.  Follow up with your health care provider as directed.  Get regular pelvic exams and Pap tests. SEEK MEDICAL CARE IF:   Your periods are late, irregular, or painful, or they stop.  Your pelvic pain or abdominal pain does not go away.  Your abdomen becomes  larger or swollen.  You have pressure on your bladder or trouble emptying your bladder completely.  You have pain during sexual intercourse.  You have feelings of fullness, pressure, or discomfort in your stomach.  You lose weight for no apparent reason.  You feel generally ill.  You become constipated.  You lose your appetite.  You develop acne.  You have an increase in body and facial hair.  You are gaining weight, without changing your exercise and eating habits.  You think you are pregnant. SEEK IMMEDIATE MEDICAL CARE IF:   You have increasing abdominal pain.  You feel sick to your stomach (nauseous), and you throw up (vomit).  You develop a fever that comes on suddenly.  You have abdominal pain during a bowel movement.  Your menstrual periods become heavier than usual. MAKE SURE YOU:  Understand these instructions.  Will watch your condition.  Will get help right away if you are not doing well or get worse.   This information is not intended to replace advice given to you by your health care provider. Make sure you discuss any questions you have with your health care provider.   Document Released: 10/14/2005 Document Revised: 10/19/2013 Document Reviewed: 06/21/2013 Elsevier Interactive Patient Education 2016 [employer] Urinary Tract Infection Urinary tract infections (UTIs) can develop anywhere along your urinary tract. Your urinary tract is your body's drainage system for removing wastes and extra water. Your urinary tract includes two kidneys, two ureters, a bladder, and a urethra. Your kidneys are a pair of bean-shaped organs. Each kidney is about the size of your fist. They are located below your ribs, one on each side of your spine. CAUSES Infections are caused by microbes, which are microscopic organisms, including fungi, viruses, and bacteria. These organisms are so small that they can only be seen through a microscope. Bacteria are the microbes that  most commonly cause UTIs. SYMPTOMS  Symptoms of UTIs may vary by age and gender of the patient and by the location of the infection. Symptoms in young women typically include a frequent and intense urge to urinate and a painful, burning feeling in the bladder or urethra during urination. Older women and men are more likely to be tired, shaky, and weak and have muscle aches and abdominal pain. A fever may mean the infection is in your kidneys. Other symptoms of a kidney infection include pain in your back or sides below the ribs, nausea, and vomiting. DIAGNOSIS To diagnose a UTI, your caregiver will ask you about your symptoms. Your caregiver will also ask you to provide a urine sample. The urine sample will be tested for bacteria and white blood cells. White blood cells are made by your body to help fight infection. TREATMENT  Typically, UTIs can be treated with medication. Because most UTIs are caused by a bacterial infection, they usually can be treated with the use of antibiotics. The choice of antibiotic and length of treatment depend on your symptoms and the type of bacteria causing your infection. HOME CARE INSTRUCTIONS  If you were prescribed antibiotics, take them exactly as your caregiver instructs you. Finish the medication even if you  feel better after you have only taken some of the medication.  Drink enough water and fluids to keep your urine clear or pale yellow.  Avoid caffeine, tea, and carbonated beverages. They tend to irritate your bladder.  Empty your bladder often. Avoid holding urine for long periods of time.  Empty your bladder before and after sexual intercourse.  After a bowel movement, women should cleanse from front to back. Use each tissue only once. SEEK MEDICAL CARE IF:   You have back pain.  You develop a fever.  Your symptoms do not begin to resolve within 3 days. SEEK IMMEDIATE MEDICAL CARE IF:   You have severe back pain or lower abdominal pain.  You  develop chills.  You have nausea or vomiting.  You have continued burning or discomfort with urination. MAKE SURE YOU:   Understand these instructions.  Will watch your condition.  Will get help right away if you are not doing well or get worse.   This information is not intended to replace advice given to you by your health care provider. Make sure you discuss any questions you have with your health care provider.   Document Released: 07/24/2005 Document Revised: 07/05/2015 Document Reviewed: 11/22/2011 Elsevier Interactive Patient Education [employer].

## 2016-04-01 NOTE — MAU Note (Signed)
PT  SAYS SHE IS GOING THRU MENOPAUSE.   LMP-  06-29-2015.      SPOTTING  STARTED YESTERDAY.     HAS PAIN ON LEFT SIDE-   STARTED YESTERDAY -  TOOK IBUPROFEN  600MG   AT 0400  TODAY.-   NO RELIEF.     SEES DR MEISINGER-     10-2015-   ALL OK THEN.       HAD BTL-    1990.       HAS SAME  PARTNER  ALL TIME.     NO VAG  D/C.

## 2016-04-01 NOTE — MAU Provider Note (Signed)
History   [id number]   Chief Complaint  Patient presents with  . Abdominal Pain    HPI Sydney Williams is a 50 y.o. female  [phone] here with report of left sided pelvic pain that started yesterday.  Pain is described as a sharp pain rated a 9/10 and continuous.  Also reports urinary urgency, however only urinates a small amount.  No report of dysuria.   In addition began having some spotting yesterday.  Last normal menstrual cycle was in September 2016.  Denies vaginal itching or odor.    Patient's last menstrual period was 06/29/2015 (exact date).  OB History  Gravida Para Term Preterm AB SAB TAB Ectopic Multiple Living  5 1 1  2 1  1  3     # Outcome Date GA Lbr Len/2nd Weight Sex Delivery Anes PTL Lv  5 Term      Vag-Spont     4 Ectopic           3 Gravida      Vag-Spont     2 Gravida      Vag-Spont     1 SAB               Past Medical History  Diagnosis Date  . Hypertension   . GERD (gastroesophageal reflux disease)   . Migraines   . Hyperlipidemia   . Diabetes mellitus     vs impaired fasting glucose  . Asthma     2015 hospitaliation for asthma  . Impaired fasting blood sugar   . History of respiratory failure     02/26/16 pt denies  . Family history of breast cancer   . Obesity   . Genital herpes   . Smoker   . Fibroids 2010    Family History  Problem Relation Age of Onset  . Diabetes Mother   . Hypertension Mother   . Aneurysm Mother   . Stroke Mother   . Cancer Mother     cervical cancer  . Cancer Maternal Aunt     breast  . Heart disease Neg Hx   . Colon cancer Neg Hx   . Cancer Cousin     breast/breast    Social History   Social History  . Marital Status: Married    Spouse Name: N/A  . Number of Children: N/A  . Years of Education: N/A   Social History Main Topics  . Smoking status: Current Every Day Smoker -- 0.25 packs/day for 13 years    Types: Cigarettes  . Smokeless tobacco: Never Used  . Alcohol Use: Yes     Comment: Once a month.    . Drug Use: No  . Sexual Activity: Yes    Birth Control/ Protection: Surgical   Other Topics Concern  . None   Social History Narrative   Married, and her granddaughter lives with her, works as Quarry manager at [employer], exercise - activity at work, walking.    Allergies  Allergen Reactions  . Pneumococcal Vaccines Swelling    Swelling at injection site   . Vicodin [Hydrocodone-Acetaminophen] Rash    No current facility-administered medications on file prior to encounter.   Current Outpatient Prescriptions on File Prior to Encounter  Medication Sig Dispense Refill  . acetaminophen (TYLENOL) 500 MG tablet Take 1,000 mg by mouth every 6 (six) hours as needed for moderate pain or headache. Reported on 12/04/2015    . acyclovir (ZOVIRAX) 200 MG capsule Take 1 capsule (200 mg total) by mouth  2 (two) times daily. 180 capsule 3  . albuterol (PROVENTIL HFA;VENTOLIN HFA) 108 (90 BASE) MCG/ACT inhaler Inhale 2 puffs into the lungs every 6 (six) hours as needed for wheezing. 1 Inhaler 0  . albuterol (PROVENTIL) (2.5 MG/3ML) 0.083% nebulizer solution Take 3 mLs (2.5 mg total) by nebulization every 6 (six) hours as needed for wheezing or shortness of breath. (Patient taking differently: Take 2.5 mg by nebulization every 6 (six) hours as needed for wheezing or shortness of breath. For shortness of breath) 75 mL 1  . amLODipine (NORVASC) 5 MG tablet Take 1 tablet (5 mg total) by mouth daily. 90 tablet 3  . aspirin EC 81 MG tablet Take 1 tablet (81 mg total) by mouth daily. 90 tablet 3  . buPROPion (WELLBUTRIN XL) 150 MG 24 hr tablet TAKE ONE TABLET BY MOUTH ONCE DAILY 30 tablet 6  . Fluticasone-Salmeterol (ADVAIR) 250-50 MCG/DOSE AEPB Inhale 1 puff into the lungs 2 (two) times daily.    Marland Kitchen ibuprofen (ADVIL,MOTRIN) 200 MG tablet Take 600-800 mg by mouth every 6 (six) hours as needed for moderate pain. Reported on 12/04/2015    . lisinopril (PRINIVIL,ZESTRIL) 20 MG tablet TAKE ONE TABLET BY MOUTH ONCE DAILY 90  tablet 1  . metFORMIN (GLUCOPHAGE) 500 MG tablet Take 1 tablet (500 mg total) by mouth daily with breakfast. 90 tablet 0  . omeprazole (PRILOSEC) 40 MG capsule Take 1 capsule (40 mg total) by mouth daily. 90 capsule 1  . simvastatin (ZOCOR) 10 MG tablet TAKE ONE TABLET BY MOUTH ONCE DAILY 90 tablet 1  . spironolactone (ALDACTONE) 25 MG tablet TAKE ONE TABLET BY MOUTH ONCE DAILY 30 tablet 1   Results for orders placed or performed during the hospital encounter of 04/01/16 (from the past 48 hour(s))  Urinalysis, Routine w reflex microscopic (not at [facility])     Status: Abnormal   Collection Time: 04/01/16  7:05 AM  Result Value Ref Range   Color, Urine YELLOW YELLOW   APPearance CLOUDY (A) CLEAR   Specific Gravity, Urine 1.015 1.005 - 1.030   pH 6.0 5.0 - 8.0   Glucose, UA NEGATIVE NEGATIVE mg/dL   Hgb urine dipstick MODERATE (A) NEGATIVE   Bilirubin Urine NEGATIVE NEGATIVE   Ketones, ur NEGATIVE NEGATIVE mg/dL   Protein, ur 30 (A) NEGATIVE mg/dL   Nitrite POSITIVE (A) NEGATIVE   Leukocytes, UA LARGE (A) NEGATIVE  Urine microscopic-add on     Status: Abnormal   Collection Time: 04/01/16  7:05 AM  Result Value Ref Range   Squamous Epithelial / LPF 0-5 (A) NONE SEEN   WBC, UA 6-30 0 - 5 WBC/hpf   RBC / HPF 0-5 0 - 5 RBC/hpf   Bacteria, UA FEW (A) NONE SEEN  Pregnancy, urine POC     Status: None   Collection Time: 04/01/16  7:10 AM  Result Value Ref Range   Preg Test, Ur NEGATIVE NEGATIVE    Comment:        THE SENSITIVITY OF THIS METHODOLOGY IS >24 mIU/mL    US Transvaginal Non-ob  04/01/2016  CLINICAL DATA:  Left lower quadrant abdominal pain EXAM: TRANSABDOMINAL AND TRANSVAGINAL ULTRASOUND OF PELVIS TECHNIQUE: Both transabdominal and transvaginal ultrasound examinations of the pelvis were performed. Transabdominal technique was performed for global imaging of the pelvis including uterus, ovaries, adnexal regions, and pelvic cul-de-sac. It was necessary to proceed with endovaginal exam  following the transabdominal exam to visualize the uterus, endometrium and ovaries. COMPARISON:  12/20/2015 FINDINGS: Uterus Measurements: 12.8  x 7.3 x 9.0 cm. Large central uterine fibroid which is partially submucosal measures 5.5 x 5.9 x 6.4 cm. This is similar to the previous exam. Right fundal fibroid measures 3.1 x 2.8 x 2.5 cm. Nabothian cyst is identified measuring 2.9 x 2.4 x 2.5 cm. Endometrium Thickness: 9 mm.  No focal abnormality visualized. Right ovary Measurements: Not visualize. No adnexal mass noted. Normal appearance/no adnexal mass. Left ovary Measurements: 5.2 x 4.2 x 4.2 cm. Normal appearance/no adnexal mass. Other findings No abnormal free fluid. IMPRESSION: 1. Enlarged fibroid uterus is similar to the previous exam. 2. Asymmetric enlargement of the left ovary when compared with 12/20/2015. The left ovary is poorly visualized and only seen transabdominally. Enlargement of the left ovary may be seen with interval development of dominant cyst, hemorrhagic cyst as well as with ovarian torsion. If there is a clinical concern for ovarian torsion consider further evaluation with ovarian Dopplers. Otherwise, Short-interval follow up ultrasound in 6-12 weeks is recommended, preferably during the week following the patient's normal menses. Electronically Signed   By: Kerby Moors M.D.   On: 04/01/2016 09:08   US Pelvis Complete  04/01/2016  CLINICAL DATA:  Left lower quadrant abdominal pain EXAM: TRANSABDOMINAL AND TRANSVAGINAL ULTRASOUND OF PELVIS TECHNIQUE: Both transabdominal and transvaginal ultrasound examinations of the pelvis were performed. Transabdominal technique was performed for global imaging of the pelvis including uterus, ovaries, adnexal regions, and pelvic cul-de-sac. It was necessary to proceed with endovaginal exam following the transabdominal exam to visualize the uterus, endometrium and ovaries. COMPARISON:  12/20/2015 FINDINGS: Uterus Measurements: 12.8 x 7.3 x 9.0 cm. Large  central uterine fibroid which is partially submucosal measures 5.5 x 5.9 x 6.4 cm. This is similar to the previous exam. Right fundal fibroid measures 3.1 x 2.8 x 2.5 cm. Nabothian cyst is identified measuring 2.9 x 2.4 x 2.5 cm. Endometrium Thickness: 9 mm.  No focal abnormality visualized. Right ovary Measurements: Not visualize. No adnexal mass noted. Normal appearance/no adnexal mass. Left ovary Measurements: 5.2 x 4.2 x 4.2 cm. Normal appearance/no adnexal mass. Other findings No abnormal free fluid. IMPRESSION: 1. Enlarged fibroid uterus is similar to the previous exam. 2. Asymmetric enlargement of the left ovary when compared with 12/20/2015. The left ovary is poorly visualized and only seen transabdominally. Enlargement of the left ovary may be seen with interval development of dominant cyst, hemorrhagic cyst as well as with ovarian torsion. If there is a clinical concern for ovarian torsion consider further evaluation with ovarian Dopplers. Otherwise, Short-interval follow up ultrasound in 6-12 weeks is recommended, preferably during the week following the patient's normal menses. Electronically Signed   By: Kerby Moors M.D.   On: 04/01/2016 09:08   [place] Art/ven Flow Abd Pelv Doppler  04/01/2016  CLINICAL DATA:  Evaluate for ovarian torsion. EXAM: DOPPLER ULTRASOUND OF OVARIES TECHNIQUE: Color and duplex Doppler ultrasound was utilized to evaluate blood flow to the ovaries. COMPARISON:  Earlier today. FINDINGS: The right ovary is not visualized. Pulsed Doppler evaluation of the left ovary demonstrates normal low-resistance arterial and venous waveforms. There is a cyst within the left ovary accounting for the enlargement identified on the exam performed earlier today. The cyst measures 3.1 x 3.6 x 3.2 cm and accounts for the new enlargement of the left ovary. IMPRESSION: 1. No evidence for left ovary torsion. 2. New left ovary cyst measures 3.6 cm. This is almost certainly benign, and no specific imaging  follow up is recommended according to the Society of Radiologists in  Ultrasound2010 Consensus Conference Statement (D Levine et al. Management of Asymptomatic Ovarian and Other Adnexal Cysts Imaged at [place]: Society of Radiologists in [place] Statement 2010. Radiology 256 (Sept 2010): [id number].). Electronically Signed   By: Kerby Moors M.D.   On: 04/01/2016 10:27    Review of Systems  Genitourinary: Positive for urgency, vaginal bleeding and pelvic pain. Negative for dysuria, hematuria, vaginal discharge and difficulty urinating.  Neurological: Negative for dizziness and light-headedness.  All other systems reviewed and are negative.    Physical Exam   Filed Vitals:   04/01/16 0702  BP: 146/93  Pulse: 88  Temp: 98.7 F (37.1 C)  TempSrc: Oral  Resp: 20  Height: 5\' 8"  (1.727 m)  Weight: 251 lb (113.853 kg)    Physical Exam  Constitutional: She is oriented to person, place, and time. She appears well-developed and well-nourished.  HENT:  Head: Normocephalic.  Eyes: Pupils are equal, round, and reactive to light.  Neck: Normal range of motion. Neck supple.  Cardiovascular: Normal rate, regular rhythm and normal heart sounds.   Respiratory: Effort normal and breath sounds normal.  GI: Soft. She exhibits no mass. There is tenderness. There is no guarding.  Genitourinary: Uterus is enlarged. Right adnexum displays no mass, no tenderness and no fullness. Left adnexum displays mass, tenderness and fullness. No vaginal discharge found.  Negative cervical motion tenderness  Neurological: She is alert and oriented to person, place, and time. She has normal reflexes.  Skin: Skin is warm and dry.    MAU Course  Procedures   0805 Report given to J. Neyla Gauntt who assumes care of patient  Sydney Williams, CNM   Toradol 60 mg IM X1 Ultram 50 mg PO X 1   Assessment and Plan   A:   1. Cyst of left ovary   2. Abdominal pain in female   3. Abdominal pain   4.  UTI (urinary tract infection), uncomplicated   5. Uterine leiomyoma, unspecified location     P:  Discharge home in stable condition  Rx: Ultram, Bactrim Return to MAU as needed, if symptoms worsen Patient advised to follow up with the [facility] as needed, if she desires follow up care Ok to take ibuprofen over the counter as directed on the bottle.   Lezlie Lye, NP 04/01/2016 11:04 AM

## 2016-05-04 ENCOUNTER — Inpatient Hospital Stay (HOSPITAL_COMMUNITY)
Admission: AD | Admit: 2016-05-04 | Discharge: 2016-05-04 | Disposition: A | Discharge status: Home / Self Care | Payer: BLUE CROSS/BLUE SHIELD | Source: Ambulatory Visit | Attending: Obstetrics & Gynecology | Admitting: Obstetrics & Gynecology

## 2016-05-04 ENCOUNTER — Encounter (HOSPITAL_COMMUNITY): Payer: Self-pay

## 2016-05-04 DIAGNOSIS — E119 Type 2 diabetes mellitus without complications: Secondary | ICD-10-CM | POA: Insufficient documentation

## 2016-05-04 DIAGNOSIS — R110 Nausea: Secondary | ICD-10-CM | POA: Insufficient documentation

## 2016-05-04 DIAGNOSIS — Z7984 Long term (current) use of oral hypoglycemic drugs: Secondary | ICD-10-CM | POA: Diagnosis not present

## 2016-05-04 DIAGNOSIS — K219 Gastro-esophageal reflux disease without esophagitis: Secondary | ICD-10-CM | POA: Diagnosis not present

## 2016-05-04 DIAGNOSIS — F17210 Nicotine dependence, cigarettes, uncomplicated: Secondary | ICD-10-CM | POA: Insufficient documentation

## 2016-05-04 DIAGNOSIS — N898 Other specified noninflammatory disorders of vagina: Secondary | ICD-10-CM | POA: Diagnosis present

## 2016-05-04 DIAGNOSIS — R1032 Left lower quadrant pain: Secondary | ICD-10-CM | POA: Insufficient documentation

## 2016-05-04 DIAGNOSIS — J45909 Unspecified asthma, uncomplicated: Secondary | ICD-10-CM | POA: Insufficient documentation

## 2016-05-04 DIAGNOSIS — Z7982 Long term (current) use of aspirin: Secondary | ICD-10-CM | POA: Diagnosis not present

## 2016-05-04 DIAGNOSIS — I10 Essential (primary) hypertension: Secondary | ICD-10-CM | POA: Diagnosis not present

## 2016-05-04 DIAGNOSIS — D251 Intramural leiomyoma of uterus: Secondary | ICD-10-CM | POA: Diagnosis not present

## 2016-05-04 LAB — URINALYSIS, ROUTINE W REFLEX MICROSCOPIC
Bilirubin Urine: NEGATIVE
Glucose, UA: NEGATIVE mg/dL
Hgb urine dipstick: NEGATIVE
Ketones, ur: NEGATIVE mg/dL
Leukocytes, UA: NEGATIVE
Nitrite: NEGATIVE
Protein, ur: NEGATIVE mg/dL
Specific Gravity, Urine: 1.01 (ref 1.005–1.030)
pH: 6.5 (ref 5.0–8.0)

## 2016-05-04 LAB — POCT PREGNANCY, URINE: Preg Test, Ur: NEGATIVE

## 2016-05-04 LAB — WET PREP, GENITAL
Clue Cells Wet Prep HPF POC: NONE SEEN
Sperm: NONE SEEN
Trich, Wet Prep: NONE SEEN
Yeast Wet Prep HPF POC: NONE SEEN

## 2016-05-04 MED ORDER — ONDANSETRON 8 MG PO TBDP
8.0000 mg | ORAL_TABLET | Freq: Once | ORAL | Status: AC
  Administered 2016-05-04: 8 mg via ORAL
  Filled 2016-05-04: qty 1

## 2016-05-04 MED ORDER — KETOROLAC TROMETHAMINE 60 MG/2ML IM SOLN
60.0000 mg | Freq: Once | INTRAMUSCULAR | Status: AC
  Administered 2016-05-04: 60 mg via INTRAMUSCULAR
  Filled 2016-05-04: qty 2

## 2016-05-04 MED ORDER — ONDANSETRON 4 MG PO TBDP: 4.0000 mg | ORAL_TABLET | Freq: Three times a day (TID) | ORAL | Status: DC | PRN

## 2016-05-04 NOTE — MAU Provider Note (Signed)
History     CSN: [id number]  Arrival date and time: 05/04/16 0614   First Provider Initiated Contact with Patient 05/04/16 0636      Chief Complaint  Patient presents with  . Abdominal Pain  . Vaginal Discharge   HPI  Sydney Williams is a 50 y.o. female who presents for abdominal pain & vaginal discharge. Symptoms began 1 week ago. Reports abdominal pain in the LLQ that is constant & aching. Currently rates pain 9/10. Has been taking ibuprofen with moderate relief but pain returns. Last took meds last night. Denies aggravating or alleviating factors. Also reports vaginal discharge x 1 week. Describes discharge as white & creamy with foul odor. Denies vaginal irritation. Denies dyspareunia or post coital bleeding & has no concern for STDs.  Endorses some nausea associated with pain. Denies vomiting, diarrhea, constipation, dysuria, fever, or vagianl bleeding.   OB History    Gravida Para Term Preterm AB TAB SAB Ectopic Multiple Living   5 1 1  2  1 1  3       Past Medical History  Diagnosis Date  . Hypertension   . GERD (gastroesophageal reflux disease)   . Migraines   . Hyperlipidemia   . Diabetes mellitus     vs impaired fasting glucose  . Asthma     2015 hospitaliation for asthma  . Impaired fasting blood sugar   . History of respiratory failure     02/26/16 pt denies  . Family history of breast cancer   . Obesity   . Genital herpes   . Smoker   . Fibroids 2010    Past Surgical History  Procedure Laterality Date  . Tubal ligation    . Cyst excision      left neck/postauricular region, benign  . Laparoscopic abdominal exploration      removal of ectopic preg    Family History  Problem Relation Age of Onset  . Diabetes Mother   . Hypertension Mother   . Aneurysm Mother   . Stroke Mother   . Cancer Mother     cervical cancer  . Cancer Maternal Aunt     breast  . Heart disease Neg Hx   . Colon cancer Neg Hx   . Cancer Cousin     breast/breast    Social  History  Substance Use Topics  . Smoking status: Current Every Day Smoker -- 0.25 packs/day for 13 years    Types: Cigarettes  . Smokeless tobacco: Never Used  . Alcohol Use: Yes     Comment: Once a month.     Allergies:  Allergies  Allergen Reactions  . Pneumococcal Vaccines Swelling    Swelling at injection site   . Vicodin [Hydrocodone-Acetaminophen] Rash    Prescriptions prior to admission  Medication Sig Dispense Refill Last Dose  . acetaminophen (TYLENOL) 500 MG tablet Take 1,000 mg by mouth every 6 (six) hours as needed for moderate pain or headache. Reported on 12/04/2015   Past Month at Unknown time  . acyclovir (ZOVIRAX) 200 MG capsule Take 1 capsule (200 mg total) by mouth 2 (two) times daily. (Patient not taking: Reported on 04/01/2016) 180 capsule 3 Not Taking at Unknown time  . albuterol (PROVENTIL HFA;VENTOLIN HFA) 108 (90 BASE) MCG/ACT inhaler Inhale 2 puffs into the lungs every 6 (six) hours as needed for wheezing. 1 Inhaler 0 Past Month at Unknown time  . albuterol (PROVENTIL) (2.5 MG/3ML) 0.083% nebulizer solution Take 3 mLs (2.5 mg total) by nebulization every  6 (six) hours as needed for wheezing or shortness of breath. (Patient taking differently: Take 2.5 mg by nebulization every 6 (six) hours as needed for wheezing or shortness of breath. For shortness of breath) 75 mL 1 Past Month at Unknown time  . amLODipine (NORVASC) 5 MG tablet Take 1 tablet (5 mg total) by mouth daily. 90 tablet 3 04/01/2016 at Unknown time  . aspirin EC 81 MG tablet Take 1 tablet (81 mg total) by mouth daily. 90 tablet 3 04/01/2016 at Unknown time  . buPROPion (WELLBUTRIN XL) 150 MG 24 hr tablet TAKE ONE TABLET BY MOUTH ONCE DAILY 30 tablet 6 04/01/2016 at Unknown time  . Fluticasone-Salmeterol (ADVAIR) 250-50 MCG/DOSE AEPB Inhale 1 puff into the lungs 2 (two) times daily.   Past Month at Unknown time  . ibuprofen (ADVIL,MOTRIN) 200 MG tablet Take 600-800 mg by mouth every 6 (six) hours as needed for  moderate pain. Reported on 12/04/2015   Past Month at Unknown time  . lisinopril (PRINIVIL,ZESTRIL) 20 MG tablet TAKE ONE TABLET BY MOUTH ONCE DAILY 90 tablet 1 04/01/2016 at Unknown time  . metFORMIN (GLUCOPHAGE) 500 MG tablet Take 1 tablet (500 mg total) by mouth daily with breakfast. 90 tablet 0 04/01/2016 at Unknown time  . omeprazole (PRILOSEC) 40 MG capsule Take 1 capsule (40 mg total) by mouth daily. 90 capsule 1 04/01/2016 at Unknown time  . simvastatin (ZOCOR) 10 MG tablet TAKE ONE TABLET BY MOUTH ONCE DAILY 90 tablet 1 04/01/2016 at Unknown time  . spironolactone (ALDACTONE) 25 MG tablet TAKE ONE TABLET BY MOUTH ONCE DAILY 30 tablet 1 04/01/2016 at Unknown time  . traMADol (ULTRAM) 50 MG tablet Take 1 tablet (50 mg total) by mouth every 6 (six) hours as needed. 15 tablet 0     Review of Systems  Constitutional: Negative.   Gastrointestinal: Positive for nausea and abdominal pain. Negative for vomiting, diarrhea, constipation and blood in stool.  Genitourinary: Negative for dysuria and frequency.       + vaginal discharge No vaginal bleeding, dyspareunia, or postcoital bleeding  Musculoskeletal: Negative for back pain.   Physical Exam   Blood pressure 152/95, pulse 90, temperature 98 F (36.7 C), resp. rate 18, last menstrual period 06/29/2015.  Physical Exam  Nursing note and vitals reviewed. Constitutional: She is oriented to person, place, and time. She appears well-developed and well-nourished. No distress.  HENT:  Head: Normocephalic and atraumatic.  Eyes: Conjunctivae are normal. Right eye exhibits no discharge. Left eye exhibits no discharge. No scleral icterus.  Neck: Normal range of motion.  Cardiovascular: Normal rate, regular rhythm and normal heart sounds.   No murmur heard. Respiratory: Effort normal and breath sounds normal. No respiratory distress. She has no wheezes.  GI: Soft. Bowel sounds are normal. She exhibits no distension. There is tenderness in the left lower  quadrant. There is no rigidity and no guarding.  Genitourinary: Uterus is enlarged (~ 12 weeks size). Uterus is not fixed. Cervix exhibits no motion tenderness. Right adnexum displays no mass, no tenderness and no fullness. Left adnexum displays tenderness. Left adnexum displays no mass and no fullness.  Neurological: She is alert and oriented to person, place, and time.  Skin: Skin is warm and dry. She is not diaphoretic.  Psychiatric: She has a normal mood and affect. Her behavior is normal. Judgment and thought content normal.    MAU Course  Procedures Results for orders placed or performed during the hospital encounter of 05/04/16 (from the past 24  hour(s))  Urinalysis, Routine w reflex microscopic (not at [facility]     Status: None   Collection Time: 05/04/16  6:22 AM  Result Value Ref Range   Color, Urine YELLOW YELLOW   APPearance CLEAR CLEAR   Specific Gravity, Urine 1.010 1.005 - 1.030   pH 6.5 5.0 - 8.0   Glucose, UA NEGATIVE NEGATIVE mg/dL   Hgb urine dipstick NEGATIVE NEGATIVE   Bilirubin Urine NEGATIVE NEGATIVE   Ketones, ur NEGATIVE NEGATIVE mg/dL   Protein, ur NEGATIVE NEGATIVE mg/dL   Nitrite NEGATIVE NEGATIVE   Leukocytes, UA NEGATIVE NEGATIVE  Pregnancy, urine POC     Status: None   Collection Time: 05/04/16  6:38 AM  Result Value Ref Range   Preg Test, Ur NEGATIVE NEGATIVE  Wet prep, genital     Status: Abnormal   Collection Time: 05/04/16  6:40 AM  Result Value Ref Range   Yeast Wet Prep HPF POC NONE SEEN NONE SEEN   Trich, Wet Prep NONE SEEN NONE SEEN   Clue Cells Wet Prep HPF POC NONE SEEN NONE SEEN   WBC, Wet Prep HPF POC FEW (A) NONE SEEN   Sperm NONE SEEN     MDM VSS, NAD Wet prep U/a negative Toradol 60 mg IM Zofran 8 mg ODT Pt reports improvement in symptoms with meds Wet prep negative Discussed f/u with gyn for management of pain & fibroids; initially pt states she did not have a gyn but at time of discharge she remembered that she had seen Dr.  Matthew Saras in 2016 for a pap smear after being referred to him by her PCP. States she would like to see him again. Will give contact information to patient.  Assessment and Plan  A: 1. Abdominal pain, left lower quadrant   2. Intramural leiomyoma of uterus   3. Nausea without vomiting     P: Discharge home Rx zofran Continue ibuprofen & tylenol prn per bottle instructions Call Dr. Matthew Saras on Monday to schedule follow up  Jorje Guild 05/04/2016, 6:35 AM

## 2016-05-04 NOTE — MAU Note (Signed)
Pt presents complaining of left lower abdominal pain x1 week. Ibuprofen helps relieve pain but it comes back. Last dose last night. Denies vaginal bleeding. Some vaginal discharge with an odor.

## 2016-05-04 NOTE — Discharge Instructions (Signed)
Abdominal Pain, Adult Many things can cause belly (abdominal) pain. Most times, the belly pain is not dangerous. Many cases of belly pain can be watched and treated at home. HOME CARE   Do not take medicines that help you go poop (laxatives) unless told to by your doctor.  Only take medicine as told by your doctor.  Eat or drink as told by your doctor. Your doctor will tell you if you should be on a special diet. GET HELP IF:  You do not know what is causing your belly pain.  You have belly pain while you are sick to your stomach (nauseous) or have runny poop (diarrhea).  You have pain while you pee or poop.  Your belly pain wakes you up at night.  You have belly pain that gets worse or better when you eat.  You have belly pain that gets worse when you eat fatty foods.  You have a fever. GET HELP RIGHT AWAY IF:   The pain does not go away within 2 hours.  You keep throwing up (vomiting).  The pain changes and is only in the right or left part of the belly.  You have bloody or tarry looking poop. MAKE SURE YOU:   Understand these instructions.  Will watch your condition.  Will get help right away if you are not doing well or get worse.   This information is not intended to replace advice given to you by your health care provider. Make sure you discuss any questions you have with your health care provider.   Document Released: 04/01/2008 Document Revised: 11/04/2014 Document Reviewed: 06/23/2013 Elsevier Interactive Patient Education 2016 Else[employer] Uterine Fibroids Uterine fibroids are tissue masses (tumors) that can develop in the womb (uterus). They are also called leiomyomas. This type of tumor is not cancerous (benign) and does not spread to other parts of the body outside of the pelvic area, which is between the hip bones. Occasionally, fibroids may develop in the fallopian tubes, in the cervix, or on the support structures (ligaments) that surround the  uterus. You can have one or many fibroids. Fibroids can vary in size, weight, and where they grow in the uterus. Some can become quite large. Most fibroids do not require medical treatment. CAUSES A fibroid can develop when a single uterine cell keeps growing (replicating). Most cells in the human body have a control mechanism that keeps them from replicating without control. SIGNS AND SYMPTOMS Symptoms may include:   Heavy bleeding during your period.  Bleeding or spotting between periods.  Pelvic pain and pressure.  Bladder problems, such as needing to urinate more often (urinary frequency) or urgently.  Inability to reproduce offspring (infertility).  Miscarriages. DIAGNOSIS Uterine fibroids are diagnosed through a physical exam. Your health care provider may feel the lumpy tumors during a pelvic exam. Ultrasonography and an MRI may be done to determine the size, location, and number of fibroids. TREATMENT Treatment may include:  Watchful waiting. This involves getting the fibroid checked by your health care provider to see if it grows or shrinks. Follow your health care provider's recommendations for how often to have this checked.  Hormone medicines. These can be taken by mouth or given through an intrauterine device (IUD).  Surgery.  Removing the fibroids (myomectomy) or the uterus (hysterectomy).  Removing blood supply to the fibroids (uterine artery embolization). If fibroids interfere with your fertility and you want to become pregnant, your health care provider may recommend having the fibroids removed.  HOME CARE INSTRUCTIONS  Keep all follow-up visits as directed by your health care provider. This is important.  Take medicines only as directed by your health care provider.  If you were prescribed a hormone treatment, take the hormone medicines exactly as directed.  Do not take aspirin, because it can cause bleeding.  Ask your health care provider about taking  iron pills and increasing the amount of dark green, leafy vegetables in your diet. These actions can help to boost your blood iron levels, which may be affected by heavy menstrual bleeding.  Pay close attention to your period and tell your health care provider about any changes, such as:  Increased blood flow that requires you to use more pads or tampons than usual per month.  A change in the number of days that your period lasts per month.  A change in symptoms that are associated with your period, such as abdominal cramping or back pain. SEEK MEDICAL CARE IF:  You have pelvic pain, back pain, or abdominal cramps that cannot be controlled with medicines.  You have an increase in bleeding between and during periods.  You soak tampons or pads in a half hour or less.  You feel lightheaded, extra tired, or weak. SEEK IMMEDIATE MEDICAL CARE IF:  You faint.  You have a sudden increase in pelvic pain.   This information is not intended to replace advice given to you by your health care provider. Make sure you discuss any questions you have with your health care provider.   Document Released: 10/11/2000 Document Revised: 11/04/2014 Document Reviewed: 04/12/2014 Elsevier Interactive Patient Education [employer].

## 2016-05-13 ENCOUNTER — Other Ambulatory Visit: Payer: Self-pay | Admitting: Medical

## 2016-05-14 NOTE — Telephone Encounter (Signed)
LM that pt will need appt next month for diabetes

## 2016-06-12 ENCOUNTER — Other Ambulatory Visit: Payer: Self-pay | Admitting: Medical

## 2016-06-13 NOTE — Telephone Encounter (Signed)
Is this okay to refill I did not see this in her med visit

## 2016-07-05 ENCOUNTER — Telehealth: Payer: Self-pay | Admitting: Medical

## 2016-07-05 NOTE — Telephone Encounter (Signed)
Called and left message to give [place] a call back to try and set up a appt per shane for a fu pt will have a health guide form

## 2016-07-08 ENCOUNTER — Other Ambulatory Visit: Payer: Self-pay | Admitting: Medical

## 2016-07-08 NOTE — Telephone Encounter (Signed)
Called again to try to make appt left voice mail

## 2016-07-17 ENCOUNTER — Ambulatory Visit: Payer: BLUE CROSS/BLUE SHIELD | Admitting: Medical

## 2016-08-20 ENCOUNTER — Other Ambulatory Visit: Payer: Self-pay | Admitting: Medical

## 2016-08-20 NOTE — Telephone Encounter (Signed)
LMTCB- pt needs appt. Victorino December

## 2016-08-21 NOTE — Telephone Encounter (Signed)
Is this okay to refill? Pt was seen for cpe in February 2017

## 2016-08-21 NOTE — Telephone Encounter (Signed)
LMTCB

## 2016-08-28 ENCOUNTER — Encounter: Payer: BLUE CROSS/BLUE SHIELD | Admitting: Medical

## 2016-09-04 ENCOUNTER — Encounter: Payer: BLUE CROSS/BLUE SHIELD | Admitting: Medical

## 2016-09-11 ENCOUNTER — Ambulatory Visit (INDEPENDENT_AMBULATORY_CARE_PROVIDER_SITE_OTHER): Payer: BLUE CROSS/BLUE SHIELD | Admitting: Medical

## 2016-09-11 ENCOUNTER — Encounter: Payer: Self-pay | Admitting: Medical

## 2016-09-11 VITALS — BP 150/70 | HR 95 | Wt 248.0 lb

## 2016-09-11 DIAGNOSIS — J4540 Moderate persistent asthma, uncomplicated: Secondary | ICD-10-CM

## 2016-09-11 DIAGNOSIS — F17200 Nicotine dependence, unspecified, uncomplicated: Secondary | ICD-10-CM | POA: Diagnosis not present

## 2016-09-11 DIAGNOSIS — R42 Dizziness and giddiness: Secondary | ICD-10-CM

## 2016-09-11 DIAGNOSIS — R252 Cramp and spasm: Secondary | ICD-10-CM | POA: Diagnosis not present

## 2016-09-11 DIAGNOSIS — R0683 Snoring: Secondary | ICD-10-CM

## 2016-09-11 DIAGNOSIS — Z8669 Personal history of other diseases of the nervous system and sense organs: Secondary | ICD-10-CM

## 2016-09-11 DIAGNOSIS — H61899 Other specified disorders of external ear, unspecified ear: Secondary | ICD-10-CM

## 2016-09-11 DIAGNOSIS — I10 Essential (primary) hypertension: Secondary | ICD-10-CM

## 2016-09-11 DIAGNOSIS — R51 Headache: Secondary | ICD-10-CM | POA: Diagnosis not present

## 2016-09-11 DIAGNOSIS — H9390 Unspecified disorder of ear, unspecified ear: Secondary | ICD-10-CM

## 2016-09-11 DIAGNOSIS — R7301 Impaired fasting glucose: Secondary | ICD-10-CM | POA: Diagnosis not present

## 2016-09-11 DIAGNOSIS — R519 Headache, unspecified: Secondary | ICD-10-CM

## 2016-09-11 DIAGNOSIS — E785 Hyperlipidemia, unspecified: Secondary | ICD-10-CM | POA: Diagnosis not present

## 2016-09-11 LAB — COMPREHENSIVE METABOLIC PANEL
ALT: 15 U/L (ref 6–29)
AST: 23 U/L (ref 10–35)
Albumin: 4.1 g/dL (ref 3.6–5.1)
Alkaline Phosphatase: 79 U/L (ref 33–130)
BUN: 14 mg/dL (ref 7–25)
CO2: 25 mmol/L (ref 20–31)
Calcium: 9.5 mg/dL (ref 8.6–10.4)
Chloride: 101 mmol/L (ref 98–110)
Creat: 0.62 mg/dL (ref 0.50–1.05)
Glucose, Bld: 98 mg/dL (ref 65–99)
Potassium: 3.8 mmol/L (ref 3.5–5.3)
Sodium: 138 mmol/L (ref 135–146)
Total Bilirubin: 0.3 mg/dL (ref 0.2–1.2)
Total Protein: 8.1 g/dL (ref 6.1–8.1)

## 2016-09-11 LAB — CBC
HCT: 41.9 % (ref 35.0–45.0)
Hemoglobin: 14 g/dL (ref 11.7–15.5)
MCH: 32.1 pg (ref 27.0–33.0)
MCHC: 33.4 g/dL (ref 32.0–36.0)
MCV: 96.1 fL (ref 80.0–100.0)
MPV: 11.6 fL (ref 7.5–12.5)
Platelets: 284 10*3/uL (ref 140–400)
RBC: 4.36 MIL/uL (ref 3.80–5.10)
RDW: 14.7 % (ref 11.0–15.0)
WBC: 4.5 10*3/uL (ref 4.0–10.5)

## 2016-09-11 LAB — TSH: TSH: 1.21 mIU/L

## 2016-09-11 MED ORDER — BUPROPION HCL ER (XL) 300 MG PO TB24: 300.0000 mg | ORAL_TABLET | Freq: Every day | ORAL | 2 refills | Status: DC

## 2016-09-11 MED ORDER — ALBUTEROL SULFATE HFA 108 (90 BASE) MCG/ACT IN AERS: 2.0000 | INHALATION_SPRAY | Freq: Four times a day (QID) | RESPIRATORY_TRACT | 0 refills | Status: DC | PRN

## 2016-09-11 MED ORDER — NICOTINE 21 MG/24HR TD PT24: 21.0000 mg | MEDICATED_PATCH | Freq: Every day | TRANSDERMAL | 0 refills | Status: DC

## 2016-09-11 MED ORDER — SPIRONOLACTONE 25 MG PO TABS: 25.0000 mg | ORAL_TABLET | Freq: Every day | ORAL | 3 refills | Status: DC

## 2016-09-11 MED ORDER — FLUTICASONE-SALMETEROL 250-50 MCG/DOSE IN AEPB: 1.0000 | INHALATION_SPRAY | Freq: Two times a day (BID) | RESPIRATORY_TRACT | 11 refills | Status: DC

## 2016-09-11 MED ORDER — AMLODIPINE BESYLATE 5 MG PO TABS: 5.0000 mg | ORAL_TABLET | Freq: Every day | ORAL | 3 refills | Status: DC

## 2016-09-11 MED ORDER — LISINOPRIL 20 MG PO TABS: 20.0000 mg | ORAL_TABLET | Freq: Every day | ORAL | 1 refills | Status: DC

## 2016-09-11 MED ORDER — ASPIRIN EC 81 MG PO TBEC: 81.0000 mg | DELAYED_RELEASE_TABLET | Freq: Every day | ORAL | 3 refills | Status: DC

## 2016-09-11 MED ORDER — SIMVASTATIN 10 MG PO TABS: 10.0000 mg | ORAL_TABLET | Freq: Every day | ORAL | 3 refills | Status: DC

## 2016-09-11 MED ORDER — METFORMIN HCL 500 MG PO TABS: ORAL_TABLET | ORAL | 3 refills | Status: DC

## 2016-09-11 MED ORDER — MUPIROCIN 2 % EX OINT: TOPICAL_OINTMENT | CUTANEOUS | 0 refills | Status: DC

## 2016-09-11 NOTE — Progress Notes (Signed)
Subjective: Chief Complaint  Patient presents with  . med check    med check , ear ache    Here for med check, f/u.   She notes 2 week hx/o tender sore inside right ear, not in canal.  Keeping it clean with soap and water but it hurts.  Hypertension - she reports being compliant with medications but refill hx/o shows she would have run out of spironolactone.   She didn't return sooner to follow up.   She does report taking Lisinopril and Amlodipine.  She does eat a fair amount of salt.    Since last visit had normal colonoscopy 02/2016 with Dr. Carlean Purl.  Smoking less than last visit here, but still smoking.  Taking Wellbutrin 150mg  XL daily.  Has done ok on patches prior.  Didn't tolerate Chantix in the past  Impaired glucose - using some diet discretion.  Has lost some weight since last visit.   Last visit 11/2015 we changed from Lisinopril HCT to plain Lisinopril.  And changed from HCTZ to spironolactone given low potassium. We stopped Lipitor and changed to simvastatin due to low LDL and cost issues.     She notes that she keeps getting headaches and pain in back.  She has hx/o migraines in the past.  Last migraines regularly were 2 years ago.  They had improved.  But lately in the last 2-3 months getting some frequent migraines.  Getting about 2 headaches per week.  They can last 6-7 hours.  Sometimes she goes to the ED for abortive therapy.   No headaches awakening her, no paraesthesias, no nausea.  Sometimes photophobia.   Has chronic vision issues, sees eye doctor, but no recent vision changes with headaches.    No hx/o sleep apnea.  Husband says she snores, but he has OSA and can't tolerate CPAP.  Not checking BP or glucose at home.     She does report some low stomach cramps and leg cramps of late.   Has hx/o uterine fibroids.   Past Medical History:  Diagnosis Date  . Asthma    2015 hospitaliation for asthma  . Diabetes mellitus    vs impaired fasting glucose  . Family  history of breast cancer   . Fibroids 2010  . Genital herpes   . GERD (gastroesophageal reflux disease)   . Hyperlipidemia   . Hypertension   . Impaired fasting blood sugar   . Migraines   . Obesity   . Smoker    Current Outpatient Prescriptions on File Prior to Visit  Medication Sig Dispense Refill  . albuterol (PROVENTIL) (2.5 MG/3ML) 0.083% nebulizer solution Take 3 mLs (2.5 mg total) by nebulization every 6 (six) hours as needed for wheezing or shortness of breath. (Patient taking differently: Take 2.5 mg by nebulization every 6 (six) hours as needed for wheezing or shortness of breath. For shortness of breath) 75 mL 1  . ibuprofen (ADVIL,MOTRIN) 200 MG tablet Take 600-800 mg by mouth every 6 (six) hours as needed for moderate pain. Reported on 12/04/2015    . omeprazole (PRILOSEC) 40 MG capsule Take 1 capsule (40 mg total) by mouth daily. 90 capsule 1  . ondansetron (ZOFRAN ODT) 4 MG disintegrating tablet Take 1 tablet (4 mg total) by mouth every 8 (eight) hours as needed for nausea or vomiting. 15 tablet 0   No current facility-administered medications on file prior to visit.    Past Surgical History:  Procedure Laterality Date  . CYST EXCISION  left neck/postauricular region, benign  . LAPAROSCOPIC ABDOMINAL EXPLORATION     removal of ectopic preg  . TUBAL LIGATION     Family History  Problem Relation Age of Onset  . Diabetes Mother   . Hypertension Mother   . Aneurysm Mother   . Stroke Mother   . Cancer Mother     cervical cancer  . Cancer Maternal Aunt     breast  . Heart disease Neg Hx   . Colon cancer Neg Hx   . Cancer Cousin     breast/breast     ROS as in subjective   Objective: BP (!) 150/70   Pulse 95   Wt 248 lb (112.5 kg)   LMP 06/29/2015 (Exact Date)   SpO2 97%   BMI 37.71 kg/m   BP Readings from Last 3 Encounters:  09/11/16 (!) 150/70  05/04/16 137/95  04/01/16 133/77    Wt Readings from Last 3 Encounters:  09/11/16 248 lb (112.5 kg)   04/01/16 251 lb (113.9 kg)  03/13/16 257 lb (116.6 kg)     General appearance: alert, no distress, WD/WN, AA female Right ear pinna antihelix with pink 1.5cm diameter raw inflamed lesions, possible localized infection HEENT: normocephalic, sclerae anicteric, PERRLA, EOMi, nares patent, no discharge or erythema, pharynx normal Oral cavity: MMM, no lesions Neck: supple, no lymphadenopathy, no thyromegaly, no masses, no bruits Heart: occasional ectopic beat, otherwise  RRR, normal S1, S2, no murmurs Lungs: CTA bilaterally, no wheezes, rhonchi, or rales Abdomen: +bs, soft, non tender, non distended, no masses, no hepatomegaly, no splenomegaly Back: non tender Musculoskeletal: nontender, no swelling, no obvious deformity Extremities: no edema, no cyanosis, no clubbing Pulses: 2+ symmetric, upper and lower extremities, normal cap refill Neurological: alert, oriented x 3, CN2-12 intact, strength normal upper extremities and lower extremities, sensation normal throughout, DTRs 2+ throughout, no cerebellar signs, gait normal Psychiatric: normal affect, behavior normal, pleasant     Adult ECG Report  Indication: ectopic beat, HTN  Rate: 91 bpm  Rhythm: sinus rhythm with premature supraventricular complexes  QRS Axis: -2 degrees  PR Interval: 168ms  QRS Duration: 18ms  QTc: 429ms  Conduction Disturbances: poor R wave progression  Other Abnormalities: none  Patient's cardiac risk factors are: dyslipidemia, hypertension, obesity (BMI >= 30 kg/m2) and smoking/ tobacco exposure.  EKG comparison: 08/2015  Narrative Interpretation: no acute change, poor R wave progression  Assessment: Encounter Diagnoses  Name Primary?  . Essential hypertension Yes  . Impaired fasting blood sugar   . Hyperlipidemia, unspecified hyperlipidemia type   . Smoker   . Snoring   . Ear lesion   . Dizziness and giddiness   . Frequent headaches   . History of migraine   . Muscle cramp   . Moderate persistent  asthma, uncomplicated      Plan: Labs today tobacco abuse - Increase Wellbutrin to 300mg  XL and begin nicotine patches.  Advised counseling on tobacco cessation EKG reviewed Consider sleep study, baseline cardiac consult Advised she limit salt intake, make lifestyle changes in regards to BP, impaired sugar, headaches Exercise regularly Skin lesion - begin mupirocin topical, and if not resolved within 10 days, then recheck Headaches - consider sleep study, migraine study with [facility]  Sydney Williams was seen today for med check.  Diagnoses and all orders for this visit:  Essential hypertension -     Comprehensive metabolic panel -     Hemoglobin A1c -     TSH -     CBC -  EKG 12-Lead  Impaired fasting blood sugar -     Hemoglobin A1c  Hyperlipidemia, unspecified hyperlipidemia type  Smoker  Snoring  Ear lesion  Dizziness and giddiness  Frequent headaches -     TSH  History of migraine  Muscle cramp -     Comprehensive metabolic panel -     Hemoglobin A1c -     TSH -     CBC -     EKG 12-Lead  Moderate persistent asthma, uncomplicated -     albuterol (PROVENTIL HFA;VENTOLIN HFA) 108 (90 Base) MCG/ACT inhaler; Inhale 2 puffs into the lungs every 6 (six) hours as needed for wheezing.  Other orders -     spironolactone (ALDACTONE) 25 MG tablet; Take 1 tablet (25 mg total) by mouth daily. -     simvastatin (ZOCOR) 10 MG tablet; Take 1 tablet (10 mg total) by mouth daily. -     metFORMIN (GLUCOPHAGE) 500 MG tablet; TAKE ONE TABLET BY MOUTH ONCE DAILY WITH  BREAKFAST -     lisinopril (PRINIVIL,ZESTRIL) 20 MG tablet; Take 1 tablet (20 mg total) by mouth daily. -     Fluticasone-Salmeterol (ADVAIR) 250-50 MCG/DOSE AEPB; Inhale 1 puff into the lungs 2 (two) times daily. -     amLODipine (NORVASC) 5 MG tablet; Take 1 tablet (5 mg total) by mouth daily. -     aspirin EC 81 MG tablet; Take 1 tablet (81 mg total) by mouth daily. -     buPROPion (WELLBUTRIN XL) 300 MG 24 hr  tablet; Take 1 tablet (300 mg total) by mouth daily. -     nicotine (NICODERM CQ - DOSED IN MG/24 HOURS) 21 mg/24hr patch; Place 1 patch (21 mg total) onto the skin daily.

## 2016-09-12 LAB — HEMOGLOBIN A1C
Hgb A1c MFr Bld: 5.5 % (ref <5.7)
Mean Plasma Glucose: 111 mg/dL

## 2016-09-13 ENCOUNTER — Other Ambulatory Visit: Payer: Self-pay | Admitting: Medical

## 2016-09-24 IMAGING — MG MM DIGITAL SCREENING BILAT W/ CAD
6 series · 6 of 6 positions shown · non-contrast
Comparison: Previous exam(s).

CLINICAL DATA: Screening.

EXAM:
DIGITAL SCREENING BILATERAL MAMMOGRAM WITH CAD

[R CC]
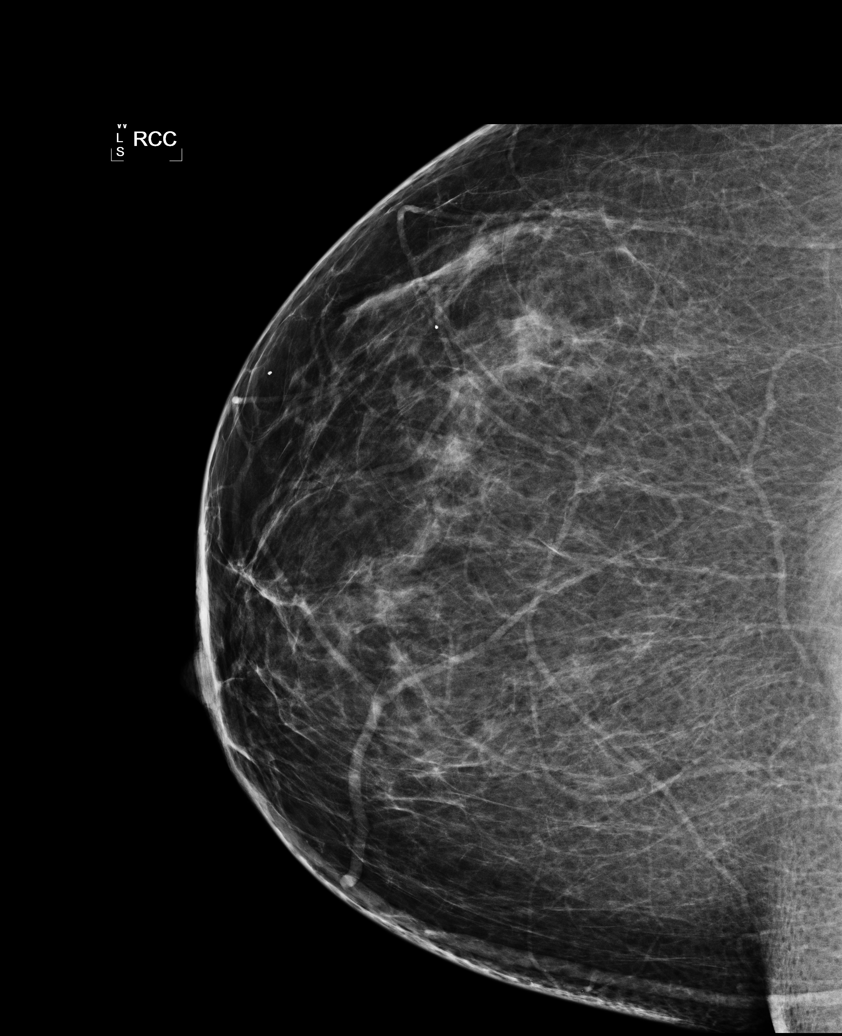

[L CC]
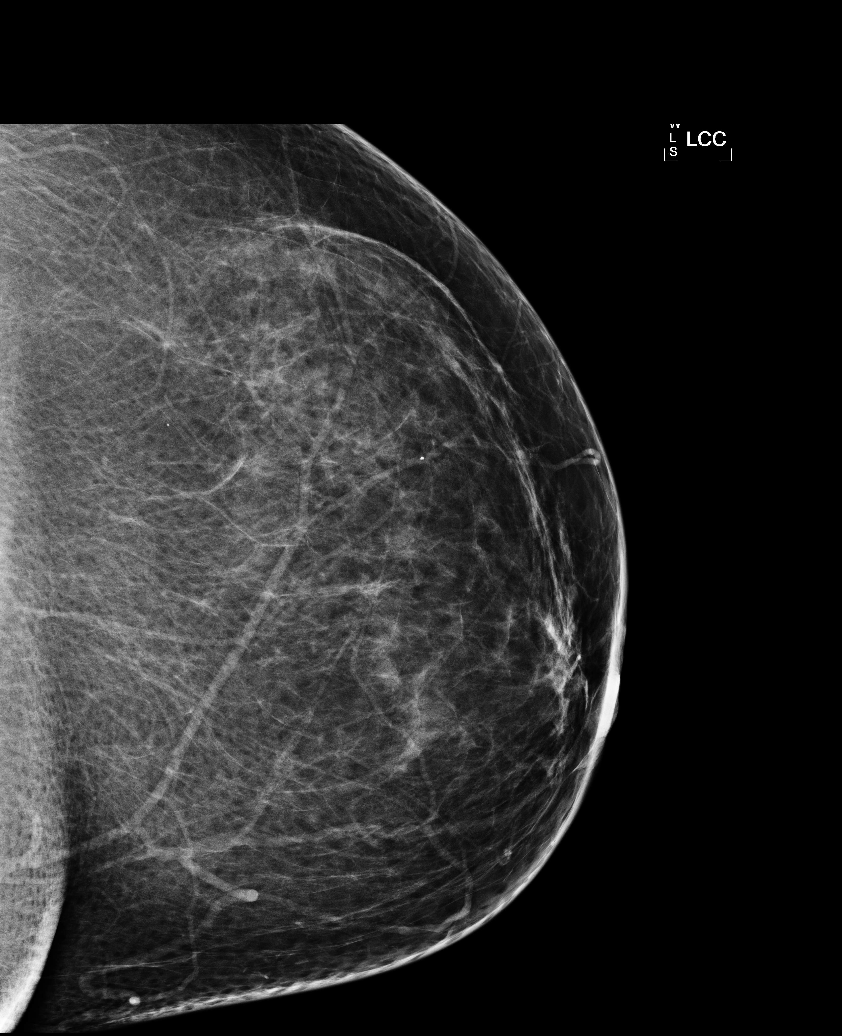

[L MLO (1 of 2)]
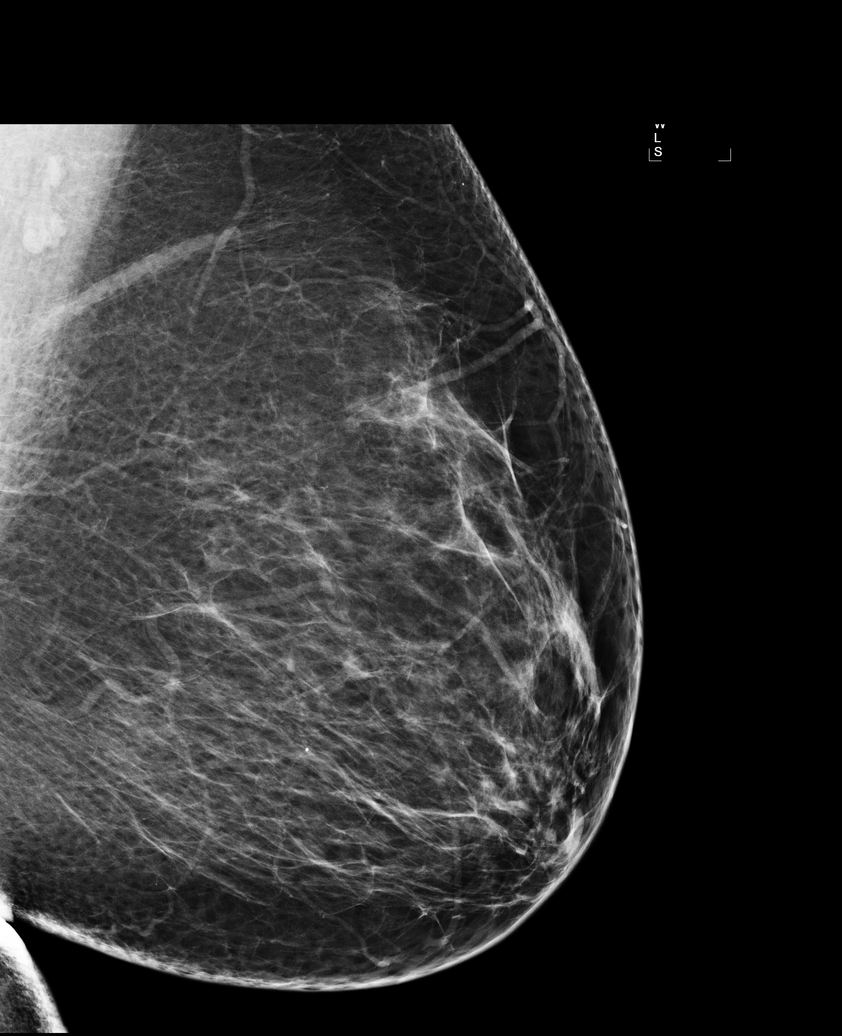

[R MLO]
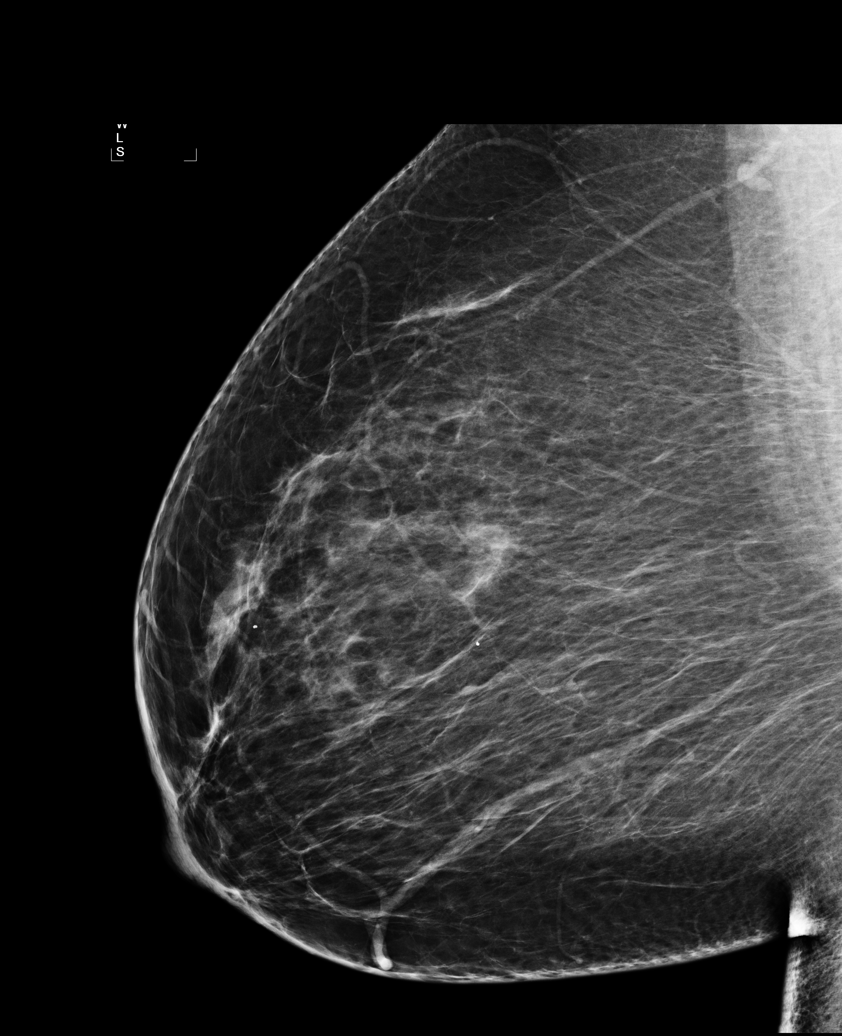

[L CV]
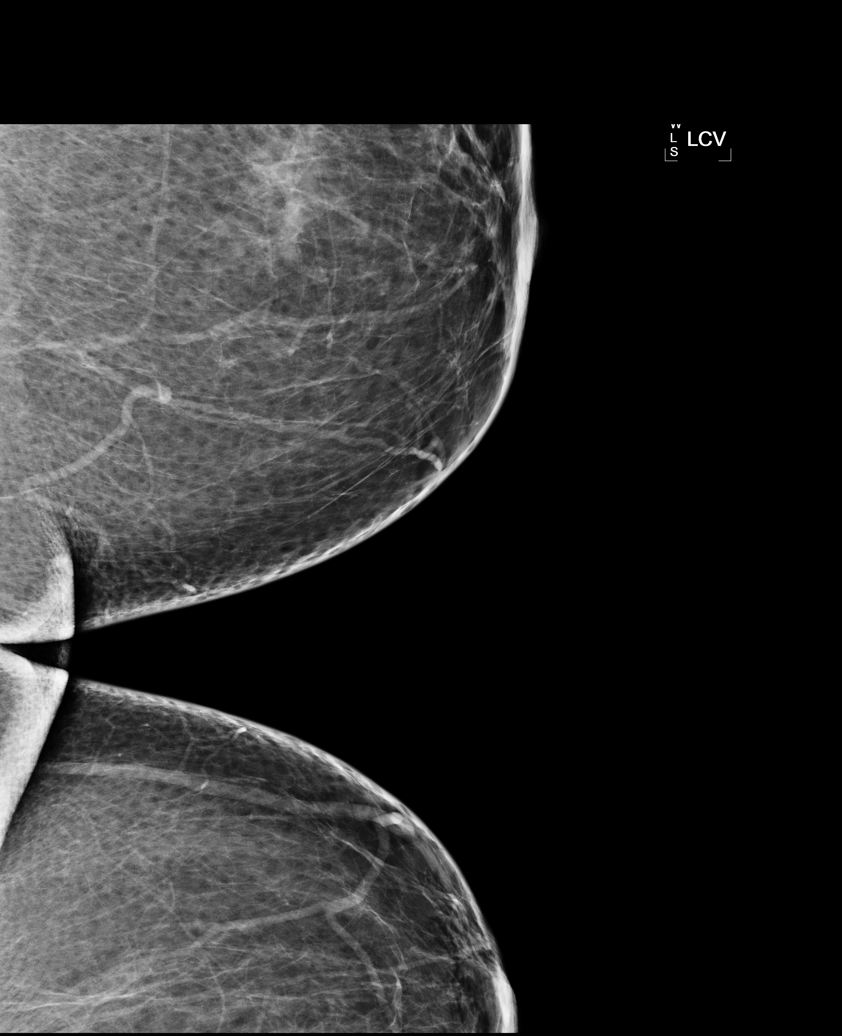

[L MLO (2 of 2)]
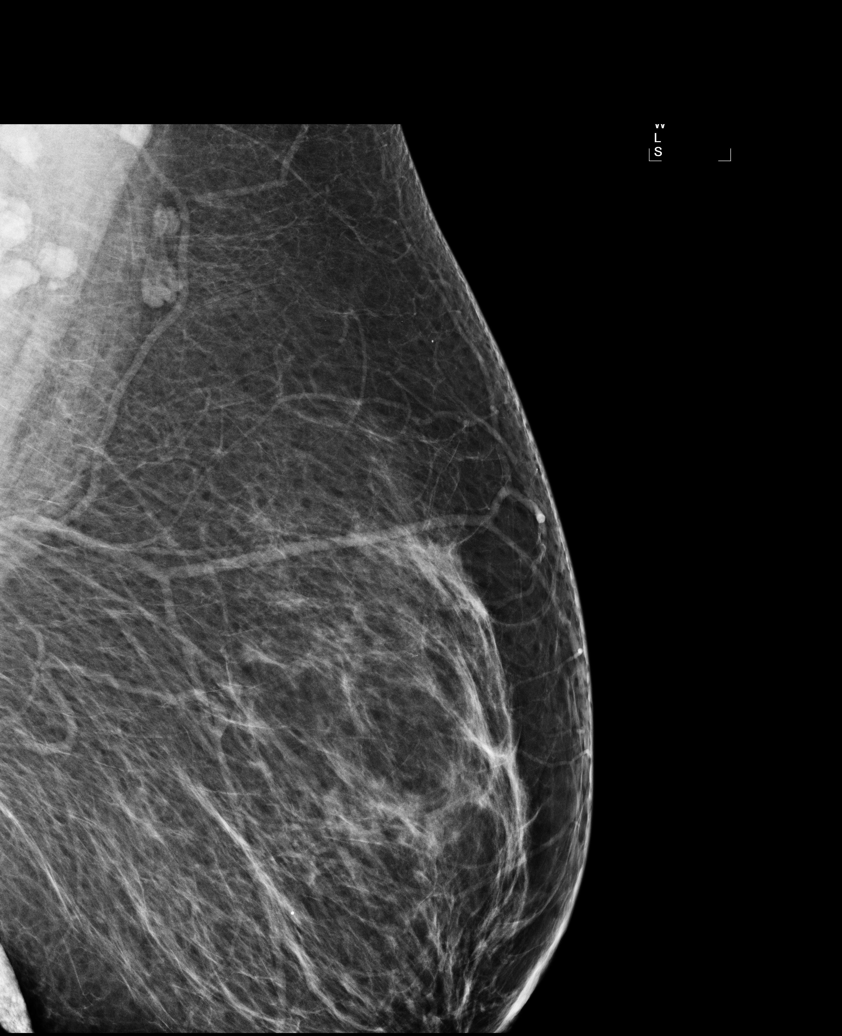

[6 of 6 positions shown; findings below may reference images not displayed]

ACR Breast Density Category b: There are scattered areas of
fibroglandular density.
FINDINGS: There are no findings suspicious for malignancy. Images were
processed with CAD.
IMPRESSION: No mammographic evidence of malignancy. A result letter of this
screening mammogram will be mailed directly to the patient.

RECOMMENDATION:
Screening mammogram in one year. (Code:AS-G-LCT)

BI-RADS CATEGORY  1: Negative.

## 2016-09-24 IMAGING — US US TRANSVAGINAL NON-OB
1 series · 14 of 25 positions shown · non-contrast
Comparison: 07/24/2015

CLINICAL DATA: Generalized pelvic pain for 2 weeks. History of
fibroids



[Series 1: us transvaginal non-ob · 0.33mm/px · 14 of 59 slices shown]
[im 1/59]
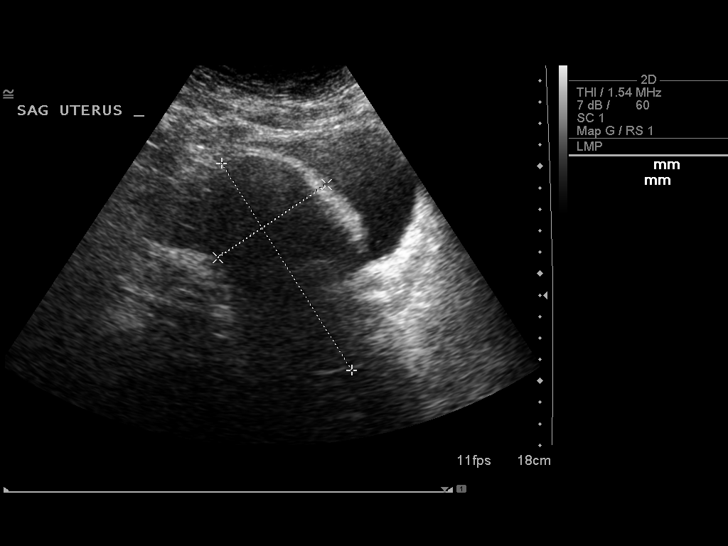
[im 5/59]
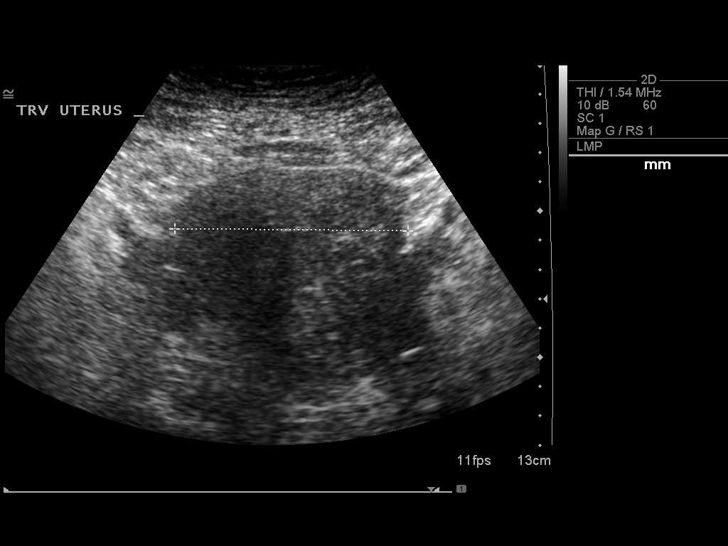
[im 10/59]
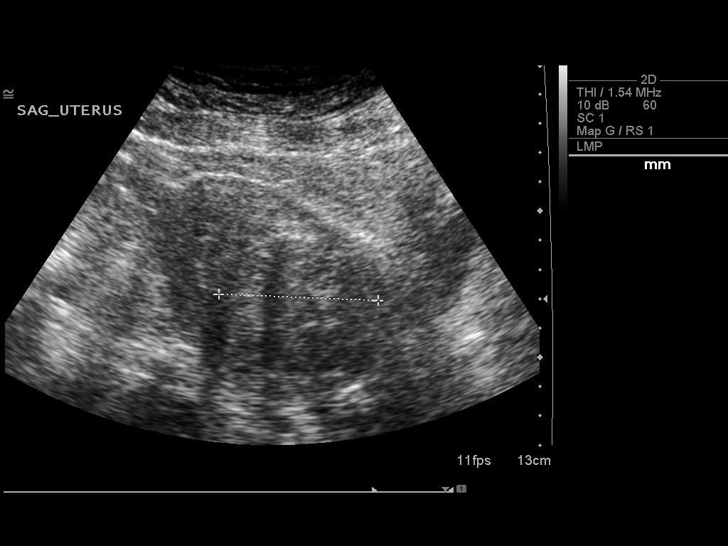
[im 15/59]
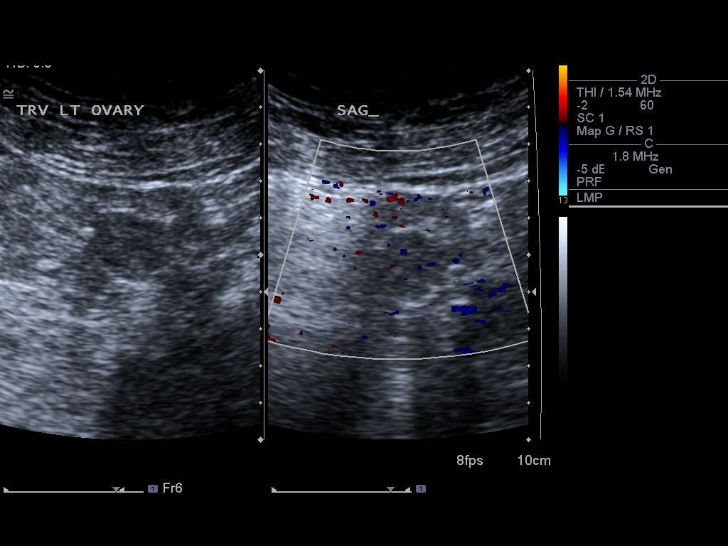
[im 20/59]
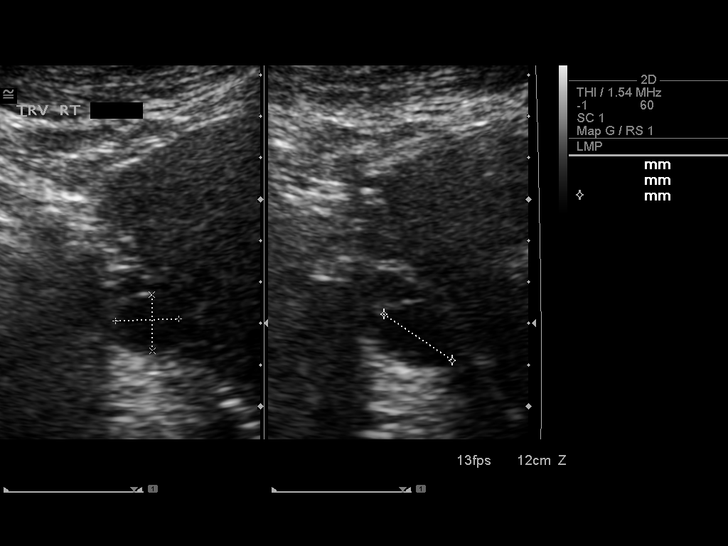
[im 22/59]
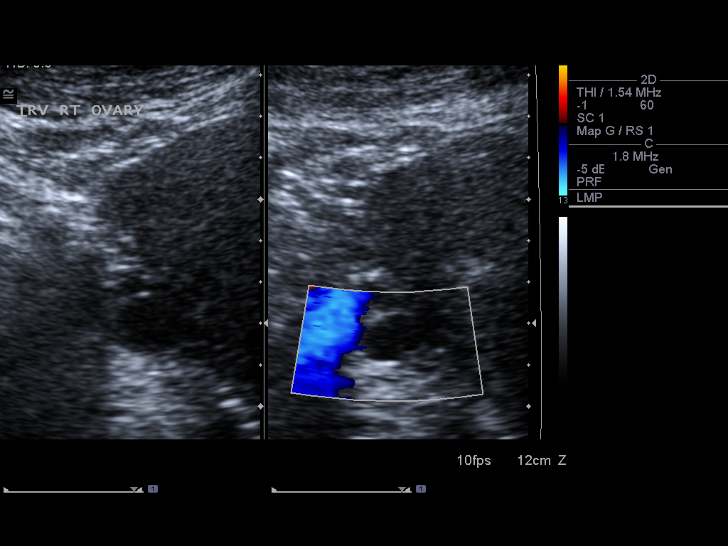
[im 27/59]
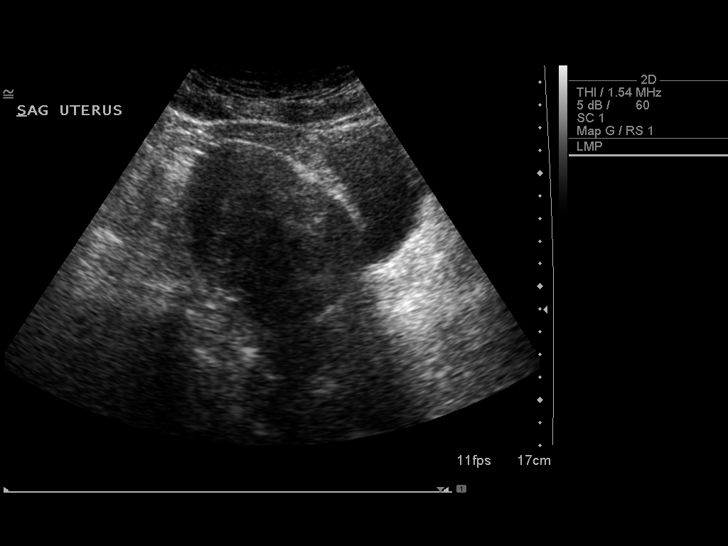
[im 32/59]
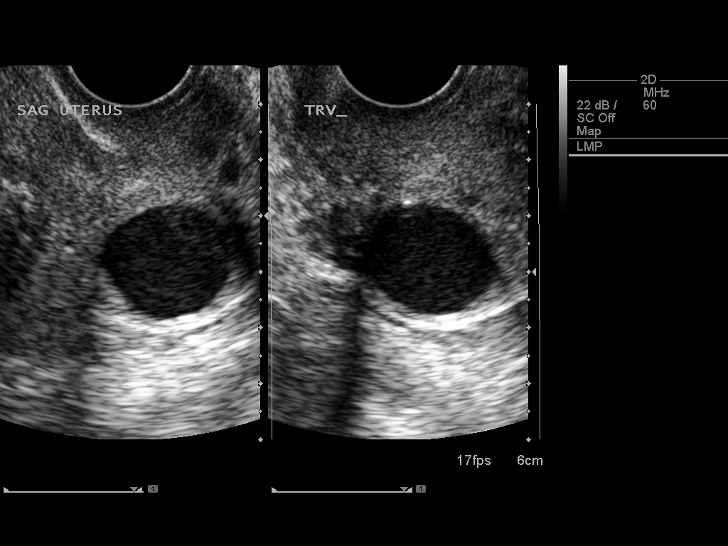
[im 37/59]
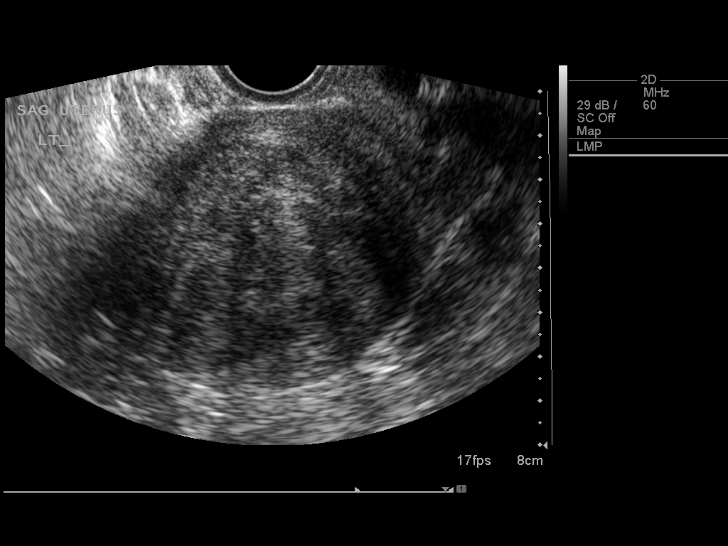
[im 39/59]
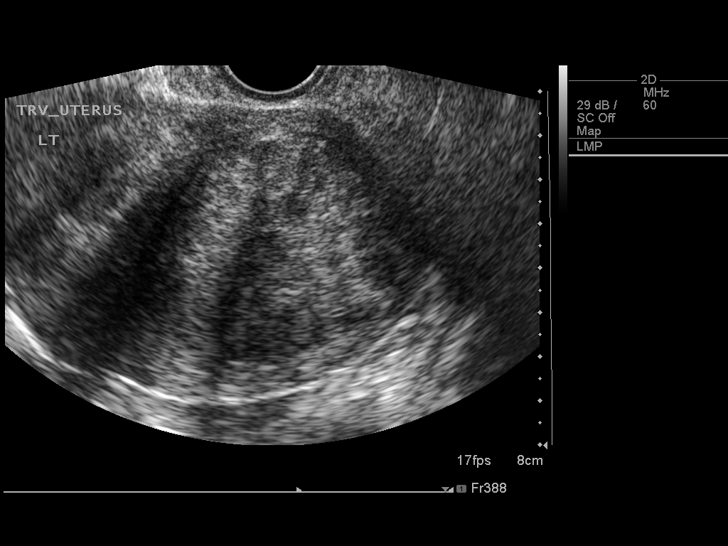
[im 44/59]
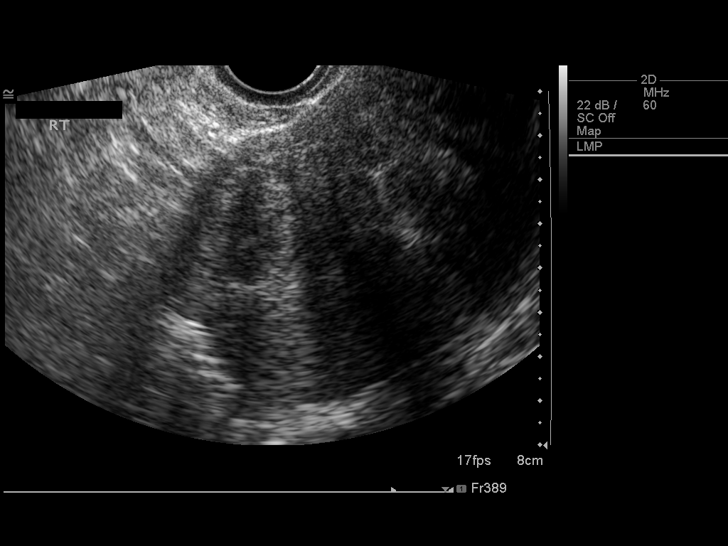
[im 49/59]
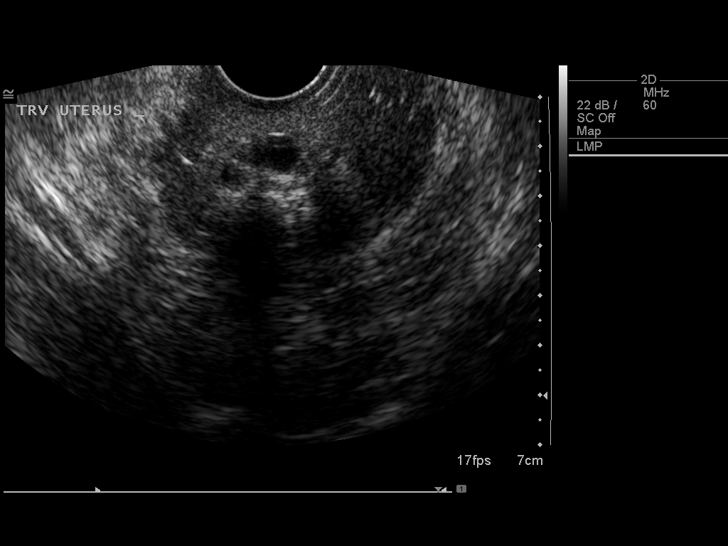
[im 54/59]
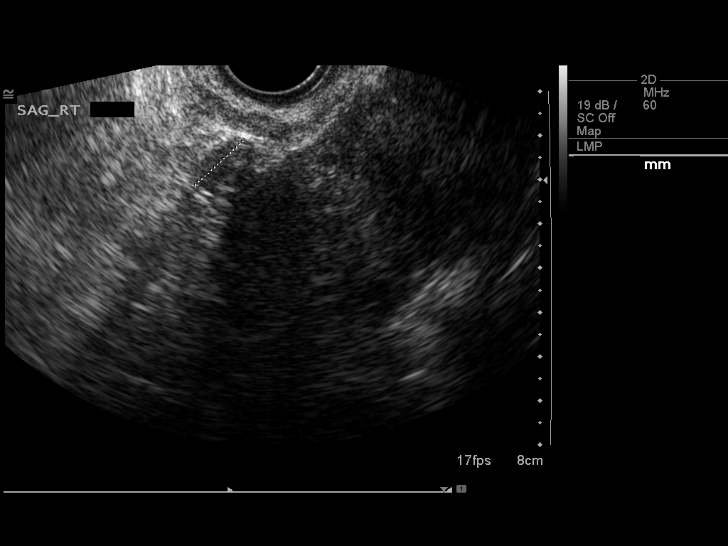
[im 59/59]
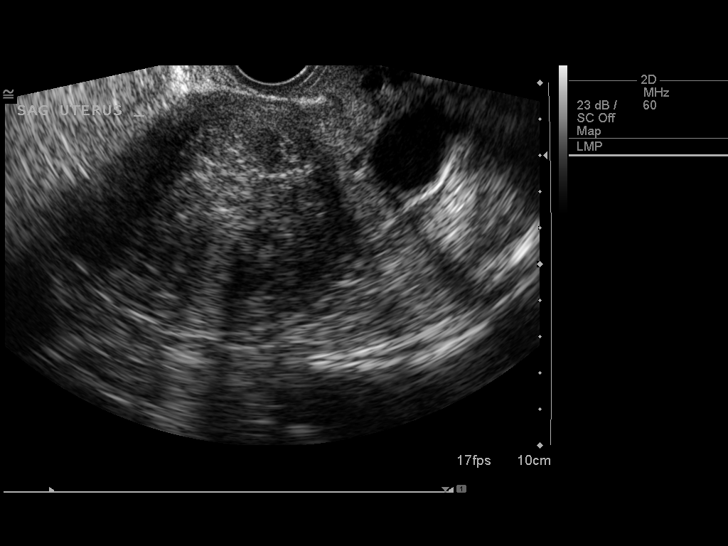

[14 of 25 positions shown; findings below may reference images not displayed]

FINDINGS: Uterus

Measurements: 10.7 x 6.8 x 10.2 cm. Left intramural fibroid measures
up to 5.9 cm. Right intramural fibroid measures up to 3.0 cm.
Subserosal fibroid on the right measures up to 1.6 cm.

Endometrium

Thickness: Not visualized due to fibroids.

Right ovary

Measurements: 2.4 x 1.8 x 1.9 cm. Small cyst measures up to 2 cm an
appears simple..

Left ovary

Measurements: 2.5 x 2.0 x 2.1 cm. Normal appearance/no adnexal mass.

Other findings

No abnormal free fluid.
IMPRESSION: Uterine fibroids as seen on prior study. The largest is a left
intramural fibroid measuring 5.9 cm.

2 cm simple appearing cyst in the right ovary.

## 2016-10-25 ENCOUNTER — Encounter (HOSPITAL_COMMUNITY): Payer: Self-pay | Admitting: Emergency Medicine

## 2016-10-25 ENCOUNTER — Emergency Department (HOSPITAL_COMMUNITY)
Admission: EM | Admit: 2016-10-25 | Discharge: 2016-10-25 | Disposition: A | Discharge status: Home / Self Care | Payer: No Typology Code available for payment source | Attending: Emergency Medicine | Admitting: Emergency Medicine

## 2016-10-25 DIAGNOSIS — M25561 Pain in right knee: Secondary | ICD-10-CM | POA: Diagnosis present

## 2016-10-25 DIAGNOSIS — I10 Essential (primary) hypertension: Secondary | ICD-10-CM | POA: Diagnosis not present

## 2016-10-25 DIAGNOSIS — Z7984 Long term (current) use of oral hypoglycemic drugs: Secondary | ICD-10-CM | POA: Insufficient documentation

## 2016-10-25 DIAGNOSIS — M25569 Pain in unspecified knee: Secondary | ICD-10-CM

## 2016-10-25 DIAGNOSIS — F17210 Nicotine dependence, cigarettes, uncomplicated: Secondary | ICD-10-CM | POA: Insufficient documentation

## 2016-10-25 DIAGNOSIS — Z79899 Other long term (current) drug therapy: Secondary | ICD-10-CM | POA: Diagnosis not present

## 2016-10-25 DIAGNOSIS — J45909 Unspecified asthma, uncomplicated: Secondary | ICD-10-CM | POA: Diagnosis not present

## 2016-10-25 DIAGNOSIS — Z7982 Long term (current) use of aspirin: Secondary | ICD-10-CM | POA: Insufficient documentation

## 2016-10-25 DIAGNOSIS — Y93F2 Activity, caregiving, lifting: Secondary | ICD-10-CM | POA: Diagnosis not present

## 2016-10-25 DIAGNOSIS — M545 Low back pain: Secondary | ICD-10-CM | POA: Insufficient documentation

## 2016-10-25 DIAGNOSIS — E119 Type 2 diabetes mellitus without complications: Secondary | ICD-10-CM | POA: Diagnosis not present

## 2016-10-25 DIAGNOSIS — W1839XA Other fall on same level, initial encounter: Secondary | ICD-10-CM | POA: Insufficient documentation

## 2016-10-25 DIAGNOSIS — W19XXXA Unspecified fall, initial encounter: Secondary | ICD-10-CM

## 2016-10-25 DIAGNOSIS — Y929 Unspecified place or not applicable: Secondary | ICD-10-CM | POA: Insufficient documentation

## 2016-10-25 DIAGNOSIS — M25562 Pain in left knee: Secondary | ICD-10-CM | POA: Insufficient documentation

## 2016-10-25 DIAGNOSIS — Y990 Civilian activity done for income or pay: Secondary | ICD-10-CM | POA: Diagnosis not present

## 2016-10-25 MED ORDER — IBUPROFEN 600 MG PO TABS: 600.0000 mg | ORAL_TABLET | Freq: Four times a day (QID) | ORAL | 0 refills | Status: DC | PRN

## 2016-10-25 MED ORDER — METHOCARBAMOL 750 MG PO TABS: 750.0000 mg | ORAL_TABLET | Freq: Three times a day (TID) | ORAL | 0 refills | Status: DC | PRN

## 2016-10-25 NOTE — Discharge Instructions (Signed)
Take Ibuprofen as prescribed for pain--take with food to prevent GI upset. Take Robaxin as prescribed for muscle spasm--caution may cause sedation--do not drink alcohol, drive, or operate machinery while taking. Rest area, apply ice for 20 minutes 4 times a day(do not apply ice directly to skin, cover with towel), elevate, and apply ace bandage (do not apply too tight; remove at least once a day, remove immediately if pale/cool leg or worsening pain). Return to ER if you experience fevers, chills, unexplained weight loss, dizziness, vision or gait changes, chest pain, shortness of breath, abdominal pain, nausea/vomiting/diarrhea, loss of bladder or bowel control, numbness to groin, extremity numbness/tingling, worsening symptoms, or any additional concerns.

## 2016-10-25 NOTE — ED Provider Notes (Signed)
[facility] DEPT Provider Note   CSN: [id number] Arrival date & time: 10/25/16  [id number]     History   Chief Complaint Chief Complaint  Patient presents with  . Fall    HPI Sydney Williams is a 50 y.o. female.  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 Denies head trauma or LOC. Denies any preceding symptoms prior to fall. Now with mild aching pain to bilateral knees, able to ambulate since, has not tried OTC meds. Also noted L lower back pain, describes as dull, worse with bending forward, with intermittent radiation down posterior L thigh, denies numbness/tingling, Denies fevers, chills, unexplained weight loss, dizziness, vision or gait changes, Cp, SOB, cough, abd pain, n/v/d, dysuria, bladder or bowel dysfunction, saddle anesthesia, extremity numbness/tingling, or any additional concerns.  No anticoag use.  No previous back or knee surgeries.    The history is provided by the patient. No language interpreter was used.  Fall  Pertinent negatives include no chest pain, no abdominal pain, no headaches and no shortness of breath.    Past Medical History:  Diagnosis Date  . Asthma    2015 hospitaliation for asthma  . Diabetes mellitus    vs impaired fasting glucose  . Family history of breast cancer   . Fibroids 2010  . Genital herpes   . GERD (gastroesophageal reflux disease)   . Hyperlipidemia   . Hypertension   . Impaired fasting blood sugar   . Migraines   . Obesity   . Smoker     Patient Active Problem List   Diagnosis Date Noted  . Snoring 09/11/2016  . Ear lesion 09/11/2016  . History of migraine 09/11/2016  . Frequent headaches 09/11/2016  . Dizziness and giddiness 09/11/2016  . Muscle cramp 09/11/2016  . Encounter for  health maintenance examination in adult 12/04/2015  . Heel pain, bilateral 12/04/2015  . Special screening for malignant neoplasms, colon 12/04/2015  . Screening for breast cancer 12/04/2015  . Need for prophylactic vaccination and inoculation against influenza 12/04/2015  . Pelvic pain in female 12/04/2015  . Moderate persistent asthma 06/01/2015  . Gastroesophageal reflux disease without esophagitis 06/01/2015  . Rhinitis, allergic 06/01/2015  . Genital herpes 11/30/2014  . Smoker 11/30/2014  . Essential hypertension 11/30/2014  . Hyperlipidemia 11/30/2014  . Impaired fasting blood sugar 11/30/2014  . Obesity 07/02/2012    Past Surgical History:  Procedure Laterality Date  . CYST EXCISION     left neck/postauricular region, benign  . LAPAROSCOPIC ABDOMINAL EXPLORATION     removal of ectopic preg  . TUBAL LIGATION      OB History    Gravida Para Term Preterm AB Living   5 1 1   2 3    SAB TAB Ectopic Multiple Live Births   1   1           Home Medications    Prior to Admission medications   Medication Sig Start Date End Date Taking? Authorizing Provider  albuterol (PROVENTIL HFA;VENTOLIN HFA) 108 (90 Base) MCG/ACT inhaler Inhale 2 puffs into the lungs every 6 (six) hours as needed for wheezing. 09/11/16   Camelia Eng Tysinger, PA-C  albuterol (PROVENTIL) (2.5 MG/3ML) 0.083% nebulizer solution Take 3  mLs (2.5 mg total) by nebulization every 6 (six) hours as needed for wheezing or shortness of breath. Patient taking differently: Take 2.5 mg by nebulization every 6 (six) hours as needed for wheezing or shortness of breath. For shortness of breath 12/01/14   Camelia Eng Tysinger, PA-C  amLODipine (NORVASC) 5 MG tablet Take 1 tablet (5 mg total) by mouth daily. 09/11/16   Carlena Hurl, PA-C  aspirin EC 81 MG tablet Take 1 tablet (81 mg total) by mouth daily. 09/11/16   Camelia Eng Tysinger, PA-C  buPROPion (WELLBUTRIN XL) 300 MG 24 hr tablet Take 1 tablet (300 mg total) by mouth daily.  09/11/16   Camelia Eng Tysinger, PA-C  Fluticasone-Salmeterol (ADVAIR) 250-50 MCG/DOSE AEPB Inhale 1 puff into the lungs 2 (two) times daily. 09/11/16   Camelia Eng Tysinger, PA-C  ibuprofen (ADVIL,MOTRIN) 200 MG tablet Take 600-800 mg by mouth every 6 (six) hours as needed for moderate pain. Reported on 12/04/2015    Historical Provider, MD  lisinopril (PRINIVIL,ZESTRIL) 20 MG tablet Take 1 tablet (20 mg total) by mouth daily. 09/11/16   Camelia Eng Tysinger, PA-C  metFORMIN (GLUCOPHAGE) 500 MG tablet TAKE ONE TABLET BY MOUTH ONCE DAILY WITH  BREAKFAST 09/11/16   Carlena Hurl, PA-C  mupirocin ointment (BACTROBAN) 2 % 1 application to ear TID [id number]   Camelia Eng Tysinger, PA-C  nicotine (NICODERM CQ - DOSED IN MG/24 HOURS) 21 mg/24hr patch Place 1 patch (21 mg total) onto the skin daily. 09/11/16   Camelia Eng Tysinger, PA-C  omeprazole (PRILOSEC) 40 MG capsule Take 1 capsule (40 mg total) by mouth daily. 06/01/15   Camelia Eng Tysinger, PA-C  ondansetron (ZOFRAN ODT) 4 MG disintegrating tablet Take 1 tablet (4 mg total) by mouth every 8 (eight) hours as needed for nausea or vomiting. 05/04/16   Jorje Guild, NP  simvastatin (ZOCOR) 10 MG tablet Take 1 tablet (10 mg total) by mouth daily. 09/11/16   Camelia Eng Tysinger, PA-C  spironolactone (ALDACTONE) 25 MG tablet Take 1 tablet (25 mg total) by mouth daily. 09/11/16   Carlena Hurl, PA-C    Family History Family History  Problem Relation Age of Onset  . Diabetes Mother   . Hypertension Mother   . Aneurysm Mother   . Stroke Mother   . Cancer Mother     cervical cancer  . Cancer Maternal Aunt     breast  . Cancer Cousin     breast/breast  . Heart disease Neg Hx   . Colon cancer Neg Hx     Social History Social History  Substance Use Topics  . Smoking status: Current Every Day Smoker    Packs/day: 0.25    Years: 13.00    Types: Cigarettes  . Smokeless tobacco: Never Used  . Alcohol use Yes     Comment: occ     Allergies   Pneumococcal vaccines  and Vicodin [hydrocodone-acetaminophen]   Review of Systems Review of Systems  Constitutional: Negative for chills, fever and unexpected weight change.  Eyes: Negative for visual disturbance.  Respiratory: Negative for cough and shortness of breath.   Cardiovascular: Negative for chest pain, palpitations and leg swelling.  Gastrointestinal: Negative for abdominal pain, diarrhea, nausea and vomiting.  Genitourinary: Negative for dysuria and flank pain.  Musculoskeletal: Positive for back pain. Negative for neck pain and neck stiffness.       Knee pain  Skin: Negative for rash.  Neurological: Negative for dizziness, seizures, weakness, numbness and headaches.  All other systems reviewed and are negative.    Physical Exam Updated Vital Signs BP (!) 164/104 (BP Location: Left Arm)   Pulse 78   Temp 97.9 F (36.6 C) (Oral)   Resp 18   Ht 6' (1.829 m)   Wt 109.8 kg   LMP 06/29/2015 (Exact Date)   SpO2 98%   BMI 32.82 kg/m   Physical Exam  Constitutional: She is oriented to person, place, and time. She appears well-developed and well-nourished.  HENT:  Head: Normocephalic and atraumatic.  Right Ear: External ear normal.  Left Ear: External ear normal.  Nose: Nose normal.  Mouth/Throat: Oropharynx is clear and moist.  Eyes: Conjunctivae and EOM are normal. Pupils are equal, round, and reactive to light.  Neck: Normal range of motion and full passive range of motion without pain. Neck supple.  Cardiovascular: Normal rate, regular rhythm, normal heart sounds and intact distal pulses.  Exam reveals no gallop and no friction rub.   No murmur heard. Pulmonary/Chest: Effort normal and breath sounds normal.  Abdominal: Soft. Normal appearance and bowel sounds are normal. There is no tenderness. There is no rebound, no guarding and no CVA tenderness.  Musculoskeletal:  +L anterior knee very mildly ttp, no isolated patellar tenderness, no fibula head tenderness, full active ROM, able  to flex 90 degrees, no major ligament instability. No significant effusion. Negative anterior/posterior drawer. Dorsalis pedis and posterior tibial pulses 2+, skin intact. +L lumbosacral paraspinous muscles mildly ttp, no midline tenderness, no stepoff, negative SLR. Patellar and achilles reflexes 2+, sharp and dull sensation grossly intact, cap refill <2sec.  Neurological: She is alert and oriented to person, place, and time. She has normal strength. No cranial nerve deficit or sensory deficit.  Reflex Scores:      Patellar reflexes are 2+ on the right side and 2+ on the left side.      Achilles reflexes are 2+ on the right side and 2+ on the left side. Gait steady while ambulating.   Skin: Skin is warm and dry.  Nursing note and vitals reviewed.    ED Treatments / Results  Labs (all labs ordered are listed, but only abnormal results are displayed) Labs Reviewed - No data to display  EKG  EKG Interpretation None       Radiology No results found.  Procedures Procedures (including critical care time)  Medications Ordered in ED Medications - No data to display   Initial Impression / Assessment and Plan / ED Course  I have reviewed the triage vital signs and the nursing notes.  Pertinent labs & imaging results that were available during my care of the patient were reviewed by me and considered in my medical decision making (see chart for details).  Clinical Course    Pt is a 50 y/o F who presents to ED for bilateral knee pain, concern for contusion. Full active ROM, no imaging at this time based on ot[place] knee rule. Also noted L lower back pain, concern for muscle strain. Doubt cauda equina, epidural abscess/hematoma, or acute spinal cord injury. No acute neuro deficits, gait steady while ambulating around ER. Discussed discharge instructions, rx and safety, RICE, and return precautions; pt verbalizes understanding and agrees with plan, denies any additional concerns.   Final  Clinical Impressions(s) / ED Diagnoses   Final diagnoses:  None    New Prescriptions New Prescriptions   No medications on file     Ulice Bold, NP 10/25/16 [place],  MD 11/09/16 [id number]

## 2016-10-25 NOTE — ED Triage Notes (Signed)
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

## 2016-11-19 ENCOUNTER — Telehealth: Payer: Self-pay

## 2016-11-19 ENCOUNTER — Other Ambulatory Visit: Payer: Self-pay | Admitting: Medical

## 2016-11-19 NOTE — Telephone Encounter (Signed)
Please call [facility].  I sent 90 day supply with 3 refills in 08/2016. She should already have refills.

## 2016-11-19 NOTE — Telephone Encounter (Signed)
[facility] will get script ready. They had this script on hold. They will call pt when script ready.

## 2016-11-19 NOTE — Telephone Encounter (Signed)
Patient needs refills of Metformin sent to [facility]

## 2016-12-02 ENCOUNTER — Encounter (HOSPITAL_COMMUNITY): Payer: Self-pay | Admitting: Emergency Medicine

## 2016-12-02 ENCOUNTER — Emergency Department (HOSPITAL_COMMUNITY)
Admission: EM | Admit: 2016-12-02 | Discharge: 2016-12-02 | Disposition: A | Discharge status: Home / Self Care | Payer: BLUE CROSS/BLUE SHIELD | Attending: Emergency Medicine | Admitting: Emergency Medicine

## 2016-12-02 DIAGNOSIS — R509 Fever, unspecified: Secondary | ICD-10-CM | POA: Diagnosis not present

## 2016-12-02 DIAGNOSIS — Z7982 Long term (current) use of aspirin: Secondary | ICD-10-CM | POA: Insufficient documentation

## 2016-12-02 DIAGNOSIS — Z79899 Other long term (current) drug therapy: Secondary | ICD-10-CM | POA: Diagnosis not present

## 2016-12-02 DIAGNOSIS — F17210 Nicotine dependence, cigarettes, uncomplicated: Secondary | ICD-10-CM | POA: Diagnosis not present

## 2016-12-02 DIAGNOSIS — I10 Essential (primary) hypertension: Secondary | ICD-10-CM | POA: Diagnosis not present

## 2016-12-02 DIAGNOSIS — R05 Cough: Secondary | ICD-10-CM | POA: Insufficient documentation

## 2016-12-02 DIAGNOSIS — R51 Headache: Secondary | ICD-10-CM | POA: Diagnosis not present

## 2016-12-02 DIAGNOSIS — E119 Type 2 diabetes mellitus without complications: Secondary | ICD-10-CM | POA: Diagnosis not present

## 2016-12-02 DIAGNOSIS — R6889 Other general symptoms and signs: Secondary | ICD-10-CM

## 2016-12-02 DIAGNOSIS — Z7984 Long term (current) use of oral hypoglycemic drugs: Secondary | ICD-10-CM | POA: Insufficient documentation

## 2016-12-02 DIAGNOSIS — J45909 Unspecified asthma, uncomplicated: Secondary | ICD-10-CM | POA: Diagnosis not present

## 2016-12-02 MED ORDER — OSELTAMIVIR PHOSPHATE 75 MG PO CAPS: 75.0000 mg | ORAL_CAPSULE | Freq: Two times a day (BID) | ORAL | 0 refills | Status: DC

## 2016-12-02 MED ORDER — BENZONATATE 100 MG PO CAPS: 100.0000 mg | ORAL_CAPSULE | Freq: Three times a day (TID) | ORAL | 0 refills | Status: DC

## 2016-12-02 NOTE — ED Triage Notes (Signed)
Pt complaint of flu like symptoms with associated headache, generalized aches, cough, and nasal congestion onset yesterday.

## 2016-12-04 ENCOUNTER — Emergency Department (HOSPITAL_COMMUNITY): Payer: BLUE CROSS/BLUE SHIELD

## 2016-12-04 ENCOUNTER — Encounter (HOSPITAL_COMMUNITY): Payer: Self-pay | Admitting: Emergency Medicine

## 2016-12-04 ENCOUNTER — Emergency Department (HOSPITAL_COMMUNITY)
Admission: EM | Admit: 2016-12-04 | Discharge: 2016-12-04 | Disposition: A | Discharge status: Home / Self Care | Payer: BLUE CROSS/BLUE SHIELD | Attending: Emergency Medicine | Admitting: Emergency Medicine

## 2016-12-04 DIAGNOSIS — F17210 Nicotine dependence, cigarettes, uncomplicated: Secondary | ICD-10-CM | POA: Diagnosis not present

## 2016-12-04 DIAGNOSIS — I10 Essential (primary) hypertension: Secondary | ICD-10-CM | POA: Diagnosis not present

## 2016-12-04 DIAGNOSIS — E119 Type 2 diabetes mellitus without complications: Secondary | ICD-10-CM | POA: Diagnosis not present

## 2016-12-04 DIAGNOSIS — J4521 Mild intermittent asthma with (acute) exacerbation: Secondary | ICD-10-CM | POA: Diagnosis not present

## 2016-12-04 DIAGNOSIS — Z7982 Long term (current) use of aspirin: Secondary | ICD-10-CM | POA: Diagnosis not present

## 2016-12-04 DIAGNOSIS — Z79899 Other long term (current) drug therapy: Secondary | ICD-10-CM | POA: Diagnosis not present

## 2016-12-04 DIAGNOSIS — J45909 Unspecified asthma, uncomplicated: Secondary | ICD-10-CM | POA: Diagnosis present

## 2016-12-04 LAB — COMPREHENSIVE METABOLIC PANEL
ALT: 21 U/L (ref 14–54)
AST: 25 U/L (ref 15–41)
Albumin: 3.9 g/dL (ref 3.5–5.0)
Alkaline Phosphatase: 75 U/L (ref 38–126)
Anion gap: 8 (ref 5–15)
BUN: 10 mg/dL (ref 6–20)
CO2: 29 mmol/L (ref 22–32)
Calcium: 9.2 mg/dL (ref 8.9–10.3)
Chloride: 103 mmol/L (ref 101–111)
Creatinine, Ser: 0.56 mg/dL (ref 0.44–1.00)
GFR calc Af Amer: >60 mL/min (ref >60)
GFR calc non Af Amer: >60 mL/min (ref >60)
Glucose, Bld: 98 mg/dL (ref 65–99)
Potassium: 3.7 mmol/L (ref 3.5–5.1)
Sodium: 140 mmol/L (ref 135–145)
Total Bilirubin: 0.5 mg/dL (ref 0.3–1.2)
Total Protein: 8.4 g/dL — ABNORMAL HIGH (ref 6.5–8.1)

## 2016-12-04 LAB — CBC WITH DIFFERENTIAL/PLATELET
Basophils Absolute: 0 10*3/uL (ref 0.0–0.1)
Basophils Relative: 0 %
Eosinophils Absolute: 0.2 10*3/uL (ref 0.0–0.7)
Eosinophils Relative: 7 %
HCT: 39.7 % (ref 36.0–46.0)
Hemoglobin: 13.5 g/dL (ref 12.0–15.0)
Lymphocytes Relative: 42 %
Lymphs Abs: 1.3 10*3/uL (ref 0.7–4.0)
MCH: 31.8 pg (ref 26.0–34.0)
MCHC: 34 g/dL (ref 30.0–36.0)
MCV: 93.4 fL (ref 78.0–100.0)
Monocytes Absolute: 0.3 10*3/uL (ref 0.1–1.0)
Monocytes Relative: 11 %
Neutro Abs: 1.3 10*3/uL — ABNORMAL LOW (ref 1.7–7.7)
Neutrophils Relative %: 40 %
Platelets: 215 10*3/uL (ref 150–400)
RBC: 4.25 MIL/uL (ref 3.87–5.11)
RDW: 14.7 % (ref 11.5–15.5)
WBC: 3.2 10*3/uL — ABNORMAL LOW (ref 4.0–10.5)

## 2016-12-04 LAB — I-STAT TROPONIN, ED: Troponin i, poc: 0.01 ng/mL (ref 0.00–0.08)

## 2016-12-04 MED ORDER — NAPROXEN 500 MG PO TABS: 500.0000 mg | ORAL_TABLET | Freq: Two times a day (BID) | ORAL | 0 refills | Status: DC

## 2016-12-04 MED ORDER — ALBUTEROL SULFATE HFA 108 (90 BASE) MCG/ACT IN AERS: 1.0000 | INHALATION_SPRAY | Freq: Four times a day (QID) | RESPIRATORY_TRACT | 0 refills | Status: DC | PRN

## 2016-12-04 MED ORDER — PREDNISONE 20 MG PO TABS
60.0000 mg | ORAL_TABLET | Freq: Once | ORAL | Status: AC
  Administered 2016-12-04: 60 mg via ORAL
  Filled 2016-12-04: qty 3

## 2016-12-04 MED ORDER — PREDNISONE 20 MG PO TABS: 60.0000 mg | ORAL_TABLET | Freq: Every day | ORAL | 0 refills | Status: DC

## 2016-12-04 MED ORDER — ALBUTEROL SULFATE (2.5 MG/3ML) 0.083% IN NEBU
5.0000 mg | INHALATION_SOLUTION | Freq: Once | RESPIRATORY_TRACT | Status: AC
  Administered 2016-12-04: 5 mg via RESPIRATORY_TRACT
  Filled 2016-12-04: qty 6

## 2016-12-04 MED ORDER — IPRATROPIUM-ALBUTEROL 0.5-2.5 (3) MG/3ML IN SOLN
3.0000 mL | Freq: Once | RESPIRATORY_TRACT | Status: AC
  Administered 2016-12-04: 3 mL via RESPIRATORY_TRACT
  Filled 2016-12-04: qty 3

## 2016-12-04 NOTE — ED Notes (Signed)
Discharge instructions, follow up care, and rx x3 reviewed with patient. Patient verbalized understanding. 

## 2016-12-04 NOTE — ED Notes (Signed)
Respiratory notified of breathing treatment.

## 2016-12-04 NOTE — ED Notes (Signed)
Bed: WA02 Expected date:  Expected time:  Means of arrival:  Comments: 

## 2016-12-04 NOTE — ED Triage Notes (Signed)
Patient complaining of wheezing. Patient states the symptoms started Tuesday. Patient states she is having chest pains also. Patient was seen two day ago.

## 2016-12-04 NOTE — ED Provider Notes (Signed)
[facility] DEPT Provider Note   CSN: [id number] Arrival date & time: 12/04/16  [id number]  History   Chief Complaint Chief Complaint  Patient presents with  . Asthma  . Chest Pain    HPI Sydney Williams is a 51 y.o. female.  HPI  51 y.o. female with a hx of Asthma, HTN, HLD, DM, Obesity, presents to the Emergency Department today complaining of wheezing. Notes symptoms began on Tuesday. Seen x 2 days ago for fall with unremarkable work up. Pt diagnosed with flu on Monday. Has been on Tamiflu. Notes wheezing last night despite albuterol inhaler use as well as bilateral lower rib pain as well. Notes non productive cough. 2-3 brief episodes of N/V/D that have resolved. Note sick contacts. No fevers. Minimal discomfort currently. No other symptoms noted.   Past Medical History:  Diagnosis Date  . Asthma    2015 hospitaliation for asthma  . Diabetes mellitus    vs impaired fasting glucose  . Family history of breast cancer   . Fibroids 2010  . Genital herpes   . GERD (gastroesophageal reflux disease)   . Hyperlipidemia   . Hypertension   . Impaired fasting blood sugar   . Migraines   . Obesity   . Smoker     Patient Active Problem List   Diagnosis Date Noted  . Snoring 09/11/2016  . Ear lesion 09/11/2016  . History of migraine 09/11/2016  . Frequent headaches 09/11/2016  . Dizziness and giddiness 09/11/2016  . Muscle cramp 09/11/2016  . Encounter for health maintenance examination in adult 12/04/2015  . Heel pain, bilateral 12/04/2015  . Special screening for malignant neoplasms, colon 12/04/2015  . Screening for breast cancer 12/04/2015  . Need for prophylactic vaccination and inoculation against influenza 12/04/2015  . Pelvic pain in female 12/04/2015  . Moderate persistent asthma 06/01/2015  . Gastroesophageal reflux disease without esophagitis 06/01/2015  . Rhinitis, allergic 06/01/2015  . Genital herpes 11/30/2014  . Smoker 11/30/2014  . Essential hypertension  11/30/2014  . Hyperlipidemia 11/30/2014  . Impaired fasting blood sugar 11/30/2014  . Obesity 07/02/2012    Past Surgical History:  Procedure Laterality Date  . CYST EXCISION     left neck/postauricular region, benign  . LAPAROSCOPIC ABDOMINAL EXPLORATION     removal of ectopic preg  . TUBAL LIGATION      OB History    Gravida Para Term Preterm AB Living   5 1 1   2 3    SAB TAB Ectopic Multiple Live Births   1   1           Home Medications    Prior to Admission medications   Medication Sig Start Date End Date Taking? Authorizing Provider  albuterol (PROVENTIL HFA;VENTOLIN HFA) 108 (90 Base) MCG/ACT inhaler Inhale 2 puffs into the lungs every 6 (six) hours as needed for wheezing. 09/11/16   Camelia Eng Tysinger, PA-C  albuterol (PROVENTIL) (2.5 MG/3ML) 0.083% nebulizer solution Take 3 mLs (2.5 mg total) by nebulization every 6 (six) hours as needed for wheezing or shortness of breath. Patient taking differently: Take 2.5 mg by nebulization every 6 (six) hours as needed for wheezing or shortness of breath. For shortness of breath 12/01/14   Camelia Eng Tysinger, PA-C  amLODipine (NORVASC) 5 MG tablet Take 1 tablet (5 mg total) by mouth daily. 09/11/16   Carlena Hurl, PA-C  aspirin EC 81 MG tablet Take 1 tablet (81 mg total) by mouth daily. 09/11/16   Carlena Hurl, PA-C  benzonatate (TESSALON) 100 MG capsule Take 1 capsule (100 mg total) by mouth every 8 (eight) hours. 12/02/16   Virgel Manifold, MD  buPROPion (WELLBUTRIN XL) 300 MG 24 hr tablet Take 1 tablet (300 mg total) by mouth daily. 09/11/16   Camelia Eng Tysinger, PA-C  Fluticasone-Salmeterol (ADVAIR) 250-50 MCG/DOSE AEPB Inhale 1 puff into the lungs 2 (two) times daily. 09/11/16   Camelia Eng Tysinger, PA-C  ibuprofen (ADVIL,MOTRIN) 600 MG tablet Take 1 tablet (600 mg total) by mouth every 6 (six) hours as needed (pain). 10/25/16   Bernadene Bell Wojeck, NP  lisinopril (PRINIVIL,ZESTRIL) 20 MG tablet Take 1 tablet (20 mg total) by mouth daily.  09/11/16   Camelia Eng Tysinger, PA-C  metFORMIN (GLUCOPHAGE) 500 MG tablet TAKE ONE TABLET BY MOUTH ONCE DAILY WITH  BREAKFAST 09/11/16   Camelia Eng Tysinger, PA-C  methocarbamol (ROBAXIN) 750 MG tablet Take 1 tablet (750 mg total) by mouth every 8 (eight) hours as needed for muscle spasms. 10/25/16   Ulice Bold, NP  mupirocin ointment (BACTROBAN) 2 % 1 application to ear TID [id number]   Camelia Eng Tysinger, PA-C  nicotine (NICODERM CQ - DOSED IN MG/24 HOURS) 21 mg/24hr patch Place 1 patch (21 mg total) onto the skin daily. 09/11/16   Camelia Eng Tysinger, PA-C  omeprazole (PRILOSEC) 40 MG capsule Take 1 capsule (40 mg total) by mouth daily. 06/01/15   Camelia Eng Tysinger, PA-C  ondansetron (ZOFRAN ODT) 4 MG disintegrating tablet Take 1 tablet (4 mg total) by mouth every 8 (eight) hours as needed for nausea or vomiting. 05/04/16   Jorje Guild, NP  oseltamivir (TAMIFLU) 75 MG capsule Take 1 capsule (75 mg total) by mouth every 12 (twelve) hours. 12/02/16   Virgel Manifold, MD  simvastatin (ZOCOR) 10 MG tablet Take 1 tablet (10 mg total) by mouth daily. 09/11/16   Camelia Eng Tysinger, PA-C  spironolactone (ALDACTONE) 25 MG tablet Take 1 tablet (25 mg total) by mouth daily. 09/11/16   Carlena Hurl, PA-C    Family History Family History  Problem Relation Age of Onset  . Diabetes Mother   . Hypertension Mother   . Aneurysm Mother   . Stroke Mother   . Cancer Mother     cervical cancer  . Cancer Maternal Aunt     breast  . Cancer Cousin     breast/breast  . Heart disease Neg Hx   . Colon cancer Neg Hx     Social History Social History  Substance Use Topics  . Smoking status: Current Every Day Smoker    Packs/day: 0.25    Years: 13.00    Types: Cigarettes  . Smokeless tobacco: Never Used  . Alcohol use Yes     Comment: occ     Allergies   Pneumococcal vaccines and Vicodin [hydrocodone-acetaminophen]   Review of Systems Review of Systems ROS reviewed and all are negative for acute change except  as noted in the HPI.  Physical Exam Updated Vital Signs BP (!) 159/106 (BP Location: Right Arm)   Pulse 78   Temp 98 F (36.7 C) (Oral)   Resp 14   Ht 5\' 11"  (1.803 m)   Wt 118.8 kg   LMP 06/29/2015 (Exact Date)   SpO2 95%   BMI 36.54 kg/m   Physical Exam  Constitutional: She is oriented to person, place, and time. She appears well-developed and well-nourished. No distress.  HENT:  Head: Normocephalic and atraumatic.  Right Ear: Tympanic membrane, external ear and ear  canal normal.  Left Ear: Tympanic membrane, external ear and ear canal normal.  Nose: Nose normal.  Mouth/Throat: Uvula is midline, oropharynx is clear and moist and mucous membranes are normal. No trismus in the jaw. No oropharyngeal exudate, posterior oropharyngeal erythema or tonsillar abscesses.  Eyes: EOM are normal. Pupils are equal, round, and reactive to light.  Neck: Normal range of motion. Neck supple. No tracheal deviation present.  Cardiovascular: Normal rate, regular rhythm, S1 normal, S2 normal, normal heart sounds, intact distal pulses and normal pulses.   Pulmonary/Chest: Effort normal. No respiratory distress. She has no decreased breath sounds. She has wheezes in the right upper field and the left upper field. She has no rhonchi. She has no rales.  Abdominal: Normal appearance and bowel sounds are normal. There is no tenderness.  Musculoskeletal: Normal range of motion.  Neurological: She is alert and oriented to person, place, and time.  Skin: Skin is warm and dry.  Psychiatric: She has a normal mood and affect. Her speech is normal and behavior is normal. Thought content normal.   ED Treatments / Results  Labs (all labs ordered are listed, but only abnormal results are displayed) Labs Reviewed  CBC WITH DIFFERENTIAL/PLATELET - Abnormal; Notable for the following:       Result Value   WBC 3.2 (*)    Neutro Abs 1.3 (*)    All other components within normal limits  COMPREHENSIVE METABOLIC  PANEL - Abnormal; Notable for the following:    Total Protein 8.4 (*)    All other components within normal limits  I-STAT TROPOININ, ED    EKG  EKG Interpretation  Date/Time:  Wednesday December 04 2016 04:27:12 EST Ventricular Rate:  79 PR Interval:    QRS Duration: 96 QT Interval:  390 QTC Calculation: 448 R Axis:   -26 Text Interpretation:  Sinus rhythm Atrial premature complexes Borderline left axis deviation Anterior infarct, old No significant change since last tracing Confirmed by Christy Gentles  MD, [place] ([id number]) on 12/04/2016 5:00:49 AM       Radiology Dg Chest 2 View  Result Date: 12/04/2016 CLINICAL DATA:  Central chest pain under both breasts extending to the back for 1 day. Diagnosis of flu 3 days ago. EXAM: CHEST  2 VIEW COMPARISON:  01/05/2016 FINDINGS: Normal heart size and pulmonary vascularity. No focal airspace disease or consolidation in the lungs. No blunting of costophrenic angles. No pneumothorax. Mediastinal contours appear intact. Degenerative changes in the spine. Right cervical rib. IMPRESSION: No active cardiopulmonary disease. Electronically Signed   By: Lucienne Capers M.D.   On: 12/04/2016 04:23    Procedures Procedures (including critical care time)  Medications Ordered in ED Medications  albuterol (PROVENTIL) (2.5 MG/3ML) 0.083% nebulizer solution 5 mg (5 mg Nebulization Given 12/04/16 0419)  ipratropium-albuterol (DUONEB) 0.5-2.5 (3) MG/3ML nebulizer solution 3 mL (3 mLs Nebulization Given 12/04/16 0814)  predniSONE (DELTASONE) tablet 60 mg (60 mg Oral Given 12/04/16 0831)   Initial Impression / Assessment and Plan / ED Course  I have reviewed the triage vital signs and the nursing notes.  Pertinent labs & imaging results that were available during my care of the patient were reviewed by me and considered in my medical decision making (see chart for details).  Final Clinical Impressions(s) / ED Diagnoses  {I have reviewed and evaluated the relevant  laboratory values. {I have reviewed and evaluated the relevant imaging studies. {I have interpreted the relevant EKG. {I have reviewed the relevant previous healthcare records. {I have  reviewed EMS Documentation. {I obtained HPI from historian.   ED Course:  Assessment: Pt is a 50yF with hx Asthma who presents with acute asthma exacerbation last night. On exam, pt in NAD. Nontoxic/nonseptic appearing. VSS. Afebrile. Lungs with bilateral wheeze. Heart RRR. Abdomen nontender soft. Lab work unremarkable. Trop negative. CXR unremarkable. Given breathing treatments in ED with improvement. No hypoxia. No tachycardia. Given Steroids in ED as well. Plan is to DC home with prednisone and follow up to PCP. Plan is to [place]. At time of discharge, Patient is in no acute distress. Vital Signs are stable. Patient is able to ambulate. Patient able to tolerate PO.   Disposition/Plan:  DC Home Additional Verbal discharge instructions given and discussed with patient.  Pt Instructed to f/u with PCP in the next week for evaluation and treatment of symptoms. Return precautions given Pt acknowledges and agrees with plan  Supervising Physician Fatima Blank, MD  Final diagnoses:  Mild intermittent asthma with exacerbation    New Prescriptions New Prescriptions   No medications on file     Shary Decamp, PA-C 12/04/16 [place], MD 12/04/16 [phone]

## 2016-12-04 NOTE — Discharge Instructions (Signed)
Please read and follow all provided instructions.  Your diagnoses today include:  1. Mild intermittent asthma with exacerbation     Tests performed today include: Chest Xray Vital signs. See below for your results today.   Medications prescribed:   Take any prescribed medications only as directed.  Home care instructions:  Follow any educational materials contained in this packet.  Follow-up instructions: Please follow-up with your primary care provider in the next 3 days for further evaluation of your symptoms and management of your asthma.  Return instructions:  Please return to the Emergency Department if you experience worsening symptoms. Please return with worsening wheezing, shortness of breath, or difficulty breathing. Return with persistent fever above 101F.  Please return if you have any other emergent concerns.  Additional Information:  Your vital signs today were: BP (!) 153/105 (BP Location: Right Arm)    Pulse 77    Temp 98 F (36.7 C) (Oral)    Resp 14    Ht 5\' 11"  (1.803 m)    Wt 118.8 kg    LMP 06/29/2015 (Exact Date)    SpO2 96%    BMI 36.54 kg/m  If your blood pressure (BP) was elevated above 135/85 this visit, please have this repeated by your doctor within one month. --------------

## 2016-12-08 NOTE — ED Provider Notes (Signed)
[facility] DEPT Provider Note   CSN: [id number] Arrival date & time: 12/02/16  [id number]     History   Chief Complaint Chief Complaint  Patient presents with  . Flu Like Symptoms    HPI Sydney Williams is a 51 y.o. female.  HPI   41yF with cough, body aches, headache, congestion. Onset yesterday. Persistent since then. Subjective fever. Cough is non-productive. Denies sore throat. No sick contacts. No unusual leg pain or swelling. No v/d. No urinary complaints.   Past Medical History:  Diagnosis Date  . Asthma    2015 hospitaliation for asthma  . Diabetes mellitus    vs impaired fasting glucose  . Family history of breast cancer   . Fibroids 2010  . Genital herpes   . GERD (gastroesophageal reflux disease)   . Hyperlipidemia   . Hypertension   . Impaired fasting blood sugar   . Migraines   . Obesity   . Smoker     Patient Active Problem List   Diagnosis Date Noted  . Snoring 09/11/2016  . Ear lesion 09/11/2016  . History of migraine 09/11/2016  . Frequent headaches 09/11/2016  . Dizziness and giddiness 09/11/2016  . Muscle cramp 09/11/2016  . Encounter for health maintenance examination in adult 12/04/2015  . Heel pain, bilateral 12/04/2015  . Special screening for malignant neoplasms, colon 12/04/2015  . Screening for breast cancer 12/04/2015  . Need for prophylactic vaccination and inoculation against influenza 12/04/2015  . Pelvic pain in female 12/04/2015  . Moderate persistent asthma 06/01/2015  . Gastroesophageal reflux disease without esophagitis 06/01/2015  . Rhinitis, allergic 06/01/2015  . Genital herpes 11/30/2014  . Smoker 11/30/2014  . Essential hypertension 11/30/2014  . Hyperlipidemia 11/30/2014  . Impaired fasting blood sugar 11/30/2014  . Obesity 07/02/2012    Past Surgical History:  Procedure Laterality Date  . CYST EXCISION     left neck/postauricular region, benign  . LAPAROSCOPIC ABDOMINAL EXPLORATION     removal of ectopic preg  .  TUBAL LIGATION      OB History    Gravida Para Term Preterm AB Living   5 1 1   2 3    SAB TAB Ectopic Multiple Live Births   1   1           Home Medications    Prior to Admission medications   Medication Sig Start Date End Date Taking? Authorizing Provider  albuterol (PROVENTIL HFA;VENTOLIN HFA) 108 (90 Base) MCG/ACT inhaler Inhale 1-2 puffs into the lungs every 6 (six) hours as needed for wheezing or shortness of breath. 12/04/16   Shary Decamp, PA-C  amLODipine (NORVASC) 5 MG tablet Take 1 tablet (5 mg total) by mouth daily. 09/11/16   Carlena Hurl, PA-C  aspirin EC 81 MG tablet Take 1 tablet (81 mg total) by mouth daily. 09/11/16   Camelia Eng Tysinger, PA-C  benzonatate (TESSALON) 100 MG capsule Take 1 capsule (100 mg total) by mouth every 8 (eight) hours. 12/02/16   Virgel Manifold, MD  buPROPion (WELLBUTRIN XL) 300 MG 24 hr tablet Take 1 tablet (300 mg total) by mouth daily. 09/11/16   Camelia Eng Tysinger, PA-C  Fluticasone-Salmeterol (ADVAIR) 250-50 MCG/DOSE AEPB Inhale 1 puff into the lungs 2 (two) times daily. 09/11/16   Camelia Eng Tysinger, PA-C  ibuprofen (ADVIL,MOTRIN) 600 MG tablet Take 1 tablet (600 mg total) by mouth every 6 (six) hours as needed (pain). 10/25/16   Bernadene Bell Wojeck, NP  lisinopril (PRINIVIL,ZESTRIL) 20 MG tablet Take 1  tablet (20 mg total) by mouth daily. 09/11/16   Camelia Eng Tysinger, PA-C  metFORMIN (GLUCOPHAGE) 500 MG tablet TAKE ONE TABLET BY MOUTH ONCE DAILY WITH  BREAKFAST 09/11/16   Camelia Eng Tysinger, PA-C  methocarbamol (ROBAXIN) 750 MG tablet Take 1 tablet (750 mg total) by mouth every 8 (eight) hours as needed for muscle spasms. 10/25/16   Ulice Bold, NP  mupirocin ointment (BACTROBAN) 2 % 1 application to ear TID [id number]   Camelia Eng Tysinger, PA-C  naproxen (NAPROSYN) 500 MG tablet Take 1 tablet (500 mg total) by mouth 2 (two) times daily. 12/04/16   Shary Decamp, PA-C  nicotine (NICODERM CQ - DOSED IN MG/24 HOURS) 21 mg/24hr patch Place 1 patch (21 mg total) onto  the skin daily. 09/11/16   Camelia Eng Tysinger, PA-C  omeprazole (PRILOSEC) 40 MG capsule Take 1 capsule (40 mg total) by mouth daily. 06/01/15   Camelia Eng Tysinger, PA-C  ondansetron (ZOFRAN ODT) 4 MG disintegrating tablet Take 1 tablet (4 mg total) by mouth every 8 (eight) hours as needed for nausea or vomiting. 05/04/16   Jorje Guild, NP  oseltamivir (TAMIFLU) 75 MG capsule Take 1 capsule (75 mg total) by mouth every 12 (twelve) hours. 12/02/16   Virgel Manifold, MD  predniSONE (DELTASONE) 20 MG tablet Take 3 tablets (60 mg total) by mouth daily. 12/04/16   Shary Decamp, PA-C  simvastatin (ZOCOR) 10 MG tablet Take 1 tablet (10 mg total) by mouth daily. 09/11/16   Camelia Eng Tysinger, PA-C  spironolactone (ALDACTONE) 25 MG tablet Take 1 tablet (25 mg total) by mouth daily. 09/11/16   Carlena Hurl, PA-C    Family History Family History  Problem Relation Age of Onset  . Diabetes Mother   . Hypertension Mother   . Aneurysm Mother   . Stroke Mother   . Cancer Mother     cervical cancer  . Cancer Maternal Aunt     breast  . Cancer Cousin     breast/breast  . Heart disease Neg Hx   . Colon cancer Neg Hx     Social History Social History  Substance Use Topics  . Smoking status: Current Every Day Smoker    Packs/day: 0.25    Years: 13.00    Types: Cigarettes  . Smokeless tobacco: Never Used  . Alcohol use Yes     Comment: occ     Allergies   Pneumococcal vaccines and Vicodin [hydrocodone-acetaminophen]   Review of Systems Review of Systems  All systems reviewed and negative, other than as noted in HPI.  Physical Exam Updated Vital Signs BP (!) 160/105 (BP Location: Left Arm)   Pulse 105   Temp 98.6 F (37 C) (Oral)   Resp 22   Wt 242 lb (109.8 kg)   LMP 06/29/2015 (Exact Date)   SpO2 97%   BMI 32.82 kg/m   Physical Exam  Constitutional: She appears well-developed and well-nourished. No distress.  HENT:  Head: Normocephalic and atraumatic.  Eyes: Conjunctivae are normal.  Right eye exhibits no discharge. Left eye exhibits no discharge.  Neck: Neck supple.  Cardiovascular: Normal rate, regular rhythm and normal heart sounds.  Exam reveals no gallop and no friction rub.   No murmur heard. Pulmonary/Chest: Effort normal and breath sounds normal. No respiratory distress.  Abdominal: Soft. She exhibits no distension. There is no tenderness.  Musculoskeletal: She exhibits no edema or tenderness.  Neurological: She is alert.  Skin: Skin is warm and dry.  Psychiatric: She  has a normal mood and affect. Her behavior is normal. Thought content normal.  Nursing note and vitals reviewed.    ED Treatments / Results  Labs (all labs ordered are listed, but only abnormal results are displayed) Labs Reviewed - No data to display  EKG  EKG Interpretation None       Radiology No results found.  Procedures Procedures (including critical care time)  Medications Ordered in ED Medications - No data to display   Initial Impression / Assessment and Plan / ED Course  I have reviewed the triage vital signs and the nursing notes.  Pertinent labs & imaging results that were available during my care of the patient were reviewed by me and considered in my medical decision making (see chart for details).   51 year old female with flulike symptoms. Will empirically start Tamiflu. She is appropriate for outpatient treatment. Tessalon as needed for cough. Return precautions discussed. Outpatient follow-up as needed otherwise.  Final Clinical Impressions(s) / ED Diagnoses   Final diagnoses:  Flu-like symptoms    New Prescriptions Discharge Medication List as of 12/02/2016  8:17 AM    START taking these medications   Details  benzonatate (TESSALON) 100 MG capsule Take 1 capsule (100 mg total) by mouth every 8 (eight) hours., Starting Mon 12/02/2016, Print    oseltamivir (TAMIFLU) 75 MG capsule Take 1 capsule (75 mg total) by mouth every 12 (twelve) hours., Starting Mon  12/02/2016, Print         Virgel Manifold, MD 12/08/16 [phone]

## 2016-12-09 ENCOUNTER — Encounter: Payer: BLUE CROSS/BLUE SHIELD | Admitting: Medical

## 2016-12-18 ENCOUNTER — Encounter: Payer: BLUE CROSS/BLUE SHIELD | Admitting: Medical

## 2016-12-18 ENCOUNTER — Telehealth: Payer: Self-pay | Admitting: Medical

## 2016-12-18 NOTE — Telephone Encounter (Signed)
Pt called and cancelled her CPE stating that her menstrual cycle started and she is bleeding really heavy so she would like to come in when she can get complete CPE with pap done all at the same time. She will reschedule

## 2016-12-19 ENCOUNTER — Other Ambulatory Visit: Payer: Self-pay | Admitting: Medical

## 2016-12-19 NOTE — Telephone Encounter (Signed)
Is this okay to refill? 

## 2016-12-23 ENCOUNTER — Ambulatory Visit (INDEPENDENT_AMBULATORY_CARE_PROVIDER_SITE_OTHER): Payer: BLUE CROSS/BLUE SHIELD | Admitting: Medical

## 2016-12-23 VITALS — BP 158/90 | HR 93 | Temp 98.5°F | Wt 248.8 lb

## 2016-12-23 DIAGNOSIS — L309 Dermatitis, unspecified: Secondary | ICD-10-CM

## 2016-12-23 DIAGNOSIS — J309 Allergic rhinitis, unspecified: Secondary | ICD-10-CM | POA: Diagnosis not present

## 2016-12-23 DIAGNOSIS — H109 Unspecified conjunctivitis: Secondary | ICD-10-CM

## 2016-12-23 MED ORDER — POLYMYXIN B-TRIMETHOPRIM 10000-0.1 UNIT/ML-% OP SOLN: 1.0000 [drp] | Freq: Four times a day (QID) | OPHTHALMIC | 0 refills | Status: DC

## 2016-12-23 NOTE — Progress Notes (Signed)
Subjective: Chief Complaint  Patient presents with  . eye burn ,icthy ,redness    1x week eye burn , itchy ,redness in both eye    Here for allergies, headache, eye symptoms.   started 1.5 weeks ago. Has year round allergies, worse in spring and fall, but the last 1.5 weeks having intense itchy eyes, headache, can't stop scratching eyes.   Wakes up with goopy and crusty debris in eyes, has to wash it out.   Having this throughout the day.   Using OTC allergy pills, used some OTC eye drops.  Has popping sensation in ears.  Using Claritin and Benadryl OTC.   No animals in the home. Smokes.  No other aggravating or relieving factors. No other complaint.  Past Medical History:  Diagnosis Date  . Asthma    2015 hospitaliation for asthma  . Diabetes mellitus    vs impaired fasting glucose  . Family history of breast cancer   . Fibroids 2010  . Genital herpes   . GERD (gastroesophageal reflux disease)   . Hyperlipidemia   . Hypertension   . Impaired fasting blood sugar   . Migraines   . Obesity   . Smoker    Current Outpatient Prescriptions on File Prior to Visit  Medication Sig Dispense Refill  . albuterol (PROVENTIL HFA;VENTOLIN HFA) 108 (90 Base) MCG/ACT inhaler Inhale 1-2 puffs into the lungs every 6 (six) hours as needed for wheezing or shortness of breath. 1 Inhaler 0  . amLODipine (NORVASC) 5 MG tablet Take 1 tablet (5 mg total) by mouth daily. 90 tablet 3  . aspirin EC 81 MG tablet Take 1 tablet (81 mg total) by mouth daily. 90 tablet 3  . buPROPion (WELLBUTRIN XL) 300 MG 24 hr tablet Take 1 tablet (300 mg total) by mouth daily. 30 tablet 2  . Fluticasone-Salmeterol (ADVAIR) 250-50 MCG/DOSE AEPB Inhale 1 puff into the lungs 2 (two) times daily. 60 each 11  . ibuprofen (ADVIL,MOTRIN) 600 MG tablet Take 1 tablet (600 mg total) by mouth every 6 (six) hours as needed (pain). 30 tablet 0  . lisinopril (PRINIVIL,ZESTRIL) 20 MG tablet Take 1 tablet (20 mg total) by mouth daily. 90 tablet 1   . metFORMIN (GLUCOPHAGE) 500 MG tablet TAKE ONE TABLET BY MOUTH ONCE DAILY WITH  BREAKFAST 90 tablet 3  . methocarbamol (ROBAXIN) 750 MG tablet Take 1 tablet (750 mg total) by mouth every 8 (eight) hours as needed for muscle spasms. 21 tablet 0  . naproxen (NAPROSYN) 500 MG tablet Take 1 tablet (500 mg total) by mouth 2 (two) times daily. 30 tablet 0  . nicotine (NICODERM CQ - DOSED IN MG/24 HOURS) 21 mg/24hr patch Place 1 patch (21 mg total) onto the skin daily. 28 patch 0  . simvastatin (ZOCOR) 10 MG tablet Take 1 tablet (10 mg total) by mouth daily. 90 tablet 3  . spironolactone (ALDACTONE) 25 MG tablet Take 1 tablet (25 mg total) by mouth daily. 90 tablet 3  . acyclovir (ZOVIRAX) 200 MG capsule TAKE ONE CAPSULE BY MOUTH TWICE DAILY 60 capsule 11   No current facility-administered medications on file prior to visit.    ROS as in subjective    Objective: BP (!) 158/90   Pulse 93   Temp 98.5 F (36.9 C)   Wt 248 lb 12.8 oz (112.9 kg)   LMP 06/29/2015 (Exact Date)   SpO2 96%   BMI 34.70 kg/m   General appearance: alert, no distress, WD/WN, irritable today inflamed  puffy skin of medial lower orbits and dorsal bridge of nose, wrinkle along dorsal nose from scratching Conjunctive injected bilat, worse left, otherwise EOMi, PERRLA HEENT: normocephalic, sclerae anicteric, TMs pearly, nares patent, no discharge or erythema, pharynx normal Oral cavity: MMM, no lesions Neck: supple, no lymphadenopathy, no thyromegaly, no masses Lungs: CTA bilaterally, no wheezes, rhonchi, or rales    Assessment: Encounter Diagnoses  Name Primary?  . Allergic rhinitis, unspecified chronicity, unspecified seasonality, unspecified trigger Yes  . Conjunctivitis of both eyes, unspecified conjunctivitis type   . Dermatitis     Plan: discussed her symptoms, concerns, and recommendations below  Recommendations:  You can use lotion, cocoa butter or OTC Cortaid cream to the nose and under eyes where  it is itchy  Continue Claritin in the morning, Benadryl at night for allergies  hydrate well with water  Begin Polytrim eye drops 1 drop each eye 4 times daily for about a week for likely conjunctivitis  You can use Tylenol or Ibuprofen for headache  You can use the OTC antihistamine drops as well as the Polytrim  Penne was seen today for eye burn ,icthy ,redness.  Diagnoses and all orders for this visit:  Allergic rhinitis, unspecified chronicity, unspecified seasonality, unspecified trigger  Conjunctivitis of both eyes, unspecified conjunctivitis type  Dermatitis  Other orders -     trimethoprim-polymyxin b (POLYTRIM) ophthalmic solution; Place 1 drop into both eyes every 6 (six) hours.

## 2016-12-23 NOTE — Patient Instructions (Signed)
Encounter Diagnoses  Name Primary?  . Allergic rhinitis, unspecified chronicity, unspecified seasonality, unspecified trigger Yes  . Conjunctivitis of both eyes, unspecified conjunctivitis type   . Dermatitis    Recommendations:  You can use lotion, cocoa butter or OTC Cortaid cream to the nose and under eyes where it is itchy  Continue Claritin in the morning, Benadryl at night for allergies  hydrate well with water  Begin Polytrim eye drops 1 drop each eye 4 times daily for about a week for likely conjunctivitis  You can use Tylenol or Ibuprofen for headache  You can use the OTC antihistamine drops as well as the Polytrim    Bacterial Conjunctivitis Introduction Bacterial conjunctivitis is an infection of your conjunctiva. This is the clear membrane that covers the white part of your eye and the inner surface of your eyelid. This condition can make your eye:  Red or pink.  Itchy. This condition is caused by bacteria. This condition spreads very easily from person to person (is contagious) and from one eye to the other eye. Follow these instructions at home: Medicines  Take or apply your antibiotic medicine as told by your doctor. Do not stop taking or applying the antibiotic even if you start to feel better.  Take or apply over-the-counter and prescription medicines only as told by your doctor.  Do not touch your eyelid with the eye drop bottle or the ointment tube. Managing discomfort  Wipe any fluid from your eye with a warm, wet washcloth or a cotton ball.  Place a cool, clean washcloth on your eye. Do this for 10-20 minutes, 3-4 times per day. General instructions  Do not wear contact lenses until the irritation is gone. Wear glasses until your doctor says it is okay to wear contacts.  Do not wear eye makeup until your symptoms are gone. Throw away any old makeup.  Change or wash your pillowcase every day.  Do not share towels or washcloths with  anyone.  Wash your hands often with soap and water. Use paper towels to dry your hands.  Do not touch or rub your eyes.  Do not drive or use heavy machinery if your vision is blurry. Contact a doctor if:  You have a fever.  Your symptoms do not get better after 10 days. Get help right away if:  You have a fever and your symptoms suddenly get worse.  You have very bad pain when you move your eye.  Your face:  Hurts.  Is red.  Is swollen.  You have sudden loss of vision. This information is not intended to replace advice given to you by your health care provider. Make sure you discuss any questions you have with your health care provider. Document Released: 07/23/2008 Document Revised: 03/21/2016 Document Reviewed: 07/27/2015  2017 [employer]

## 2017-01-08 ENCOUNTER — Encounter: Payer: Self-pay | Admitting: Medical

## 2017-01-08 ENCOUNTER — Other Ambulatory Visit (HOSPITAL_COMMUNITY)
Admission: RE | Admit: 2017-01-08 | Discharge: 2017-01-08 | Disposition: A | Discharge status: Home / Self Care | Payer: BLUE CROSS/BLUE SHIELD | Source: Ambulatory Visit | Attending: Family Medicine | Admitting: Family Medicine

## 2017-01-08 ENCOUNTER — Ambulatory Visit (INDEPENDENT_AMBULATORY_CARE_PROVIDER_SITE_OTHER): Payer: BLUE CROSS/BLUE SHIELD | Admitting: Medical

## 2017-01-08 ENCOUNTER — Other Ambulatory Visit: Payer: Self-pay | Admitting: Medical

## 2017-01-08 VITALS — BP 162/90 | HR 85 | Ht 70.0 in | Wt 249.2 lb

## 2017-01-08 DIAGNOSIS — Z1231 Encounter for screening mammogram for malignant neoplasm of breast: Secondary | ICD-10-CM

## 2017-01-08 DIAGNOSIS — Z8669 Personal history of other diseases of the nervous system and sense organs: Secondary | ICD-10-CM | POA: Diagnosis not present

## 2017-01-08 DIAGNOSIS — T887XXA Unspecified adverse effect of drug or medicament, initial encounter: Secondary | ICD-10-CM | POA: Insufficient documentation

## 2017-01-08 DIAGNOSIS — Z0000 Encounter for general adult medical examination without abnormal findings: Secondary | ICD-10-CM | POA: Diagnosis not present

## 2017-01-08 DIAGNOSIS — J4540 Moderate persistent asthma, uncomplicated: Secondary | ICD-10-CM | POA: Diagnosis not present

## 2017-01-08 DIAGNOSIS — F17200 Nicotine dependence, unspecified, uncomplicated: Secondary | ICD-10-CM

## 2017-01-08 DIAGNOSIS — R252 Cramp and spasm: Secondary | ICD-10-CM | POA: Diagnosis not present

## 2017-01-08 DIAGNOSIS — R7301 Impaired fasting glucose: Secondary | ICD-10-CM

## 2017-01-08 DIAGNOSIS — J309 Allergic rhinitis, unspecified: Secondary | ICD-10-CM | POA: Diagnosis not present

## 2017-01-08 DIAGNOSIS — Z1239 Encounter for other screening for malignant neoplasm of breast: Secondary | ICD-10-CM

## 2017-01-08 DIAGNOSIS — E669 Obesity, unspecified: Secondary | ICD-10-CM

## 2017-01-08 DIAGNOSIS — Z124 Encounter for screening for malignant neoplasm of cervix: Secondary | ICD-10-CM

## 2017-01-08 DIAGNOSIS — A6000 Herpesviral infection of urogenital system, unspecified: Secondary | ICD-10-CM | POA: Diagnosis not present

## 2017-01-08 DIAGNOSIS — I10 Essential (primary) hypertension: Secondary | ICD-10-CM

## 2017-01-08 DIAGNOSIS — K219 Gastro-esophageal reflux disease without esophagitis: Secondary | ICD-10-CM

## 2017-01-08 DIAGNOSIS — Z0101 Encounter for examination of eyes and vision with abnormal findings: Secondary | ICD-10-CM | POA: Insufficient documentation

## 2017-01-08 DIAGNOSIS — T50A95D Adverse effect of other bacterial vaccines, subsequent encounter: Secondary | ICD-10-CM

## 2017-01-08 DIAGNOSIS — R0683 Snoring: Secondary | ICD-10-CM

## 2017-01-08 DIAGNOSIS — T50A95A Adverse effect of other bacterial vaccines, initial encounter: Secondary | ICD-10-CM | POA: Insufficient documentation

## 2017-01-08 DIAGNOSIS — Z23 Encounter for immunization: Secondary | ICD-10-CM

## 2017-01-08 DIAGNOSIS — E785 Hyperlipidemia, unspecified: Secondary | ICD-10-CM | POA: Diagnosis not present

## 2017-01-08 DIAGNOSIS — Z01419 Encounter for gynecological examination (general) (routine) without abnormal findings: Secondary | ICD-10-CM | POA: Insufficient documentation

## 2017-01-08 DIAGNOSIS — R51 Headache: Secondary | ICD-10-CM | POA: Diagnosis not present

## 2017-01-08 DIAGNOSIS — Z7189 Other specified counseling: Secondary | ICD-10-CM

## 2017-01-08 DIAGNOSIS — T887XXD Unspecified adverse effect of drug or medicament, subsequent encounter: Secondary | ICD-10-CM

## 2017-01-08 DIAGNOSIS — R519 Headache, unspecified: Secondary | ICD-10-CM

## 2017-01-08 DIAGNOSIS — Z7185 Encounter for immunization safety counseling: Secondary | ICD-10-CM

## 2017-01-08 LAB — LIPID PANEL
Cholesterol: 161 mg/dL (ref <200)
HDL: 54 mg/dL (ref >50)
LDL Cholesterol: 76 mg/dL (ref <100)
Total CHOL/HDL Ratio: 3 Ratio (ref <5.0)
Triglycerides: 157 mg/dL — ABNORMAL HIGH (ref <150)
VLDL: 31 mg/dL — ABNORMAL HIGH (ref <30)

## 2017-01-08 LAB — POCT URINALYSIS DIPSTICK
Bilirubin, UA: NEGATIVE
Blood, UA: NEGATIVE
Clarity, UA: NEGATIVE
Glucose, UA: NEGATIVE
Ketones, UA: NEGATIVE
Leukocytes, UA: NEGATIVE
Nitrite, UA: NEGATIVE
Protein, UA: NEGATIVE
Spec Grav, UA: 1.03
Urobilinogen, UA: NEGATIVE
pH, UA: 6

## 2017-01-08 MED ORDER — ASPIRIN EC 81 MG PO TBEC: 81.0000 mg | DELAYED_RELEASE_TABLET | Freq: Every day | ORAL | 3 refills | Status: DC

## 2017-01-08 MED ORDER — NICOTINE 21 MG/24HR TD PT24: 21.0000 mg | MEDICATED_PATCH | Freq: Every day | TRANSDERMAL | 0 refills | Status: DC

## 2017-01-08 MED ORDER — FLUTICASONE-SALMETEROL 250-50 MCG/DOSE IN AEPB: 1.0000 | INHALATION_SPRAY | Freq: Two times a day (BID) | RESPIRATORY_TRACT | 11 refills | Status: DC

## 2017-01-08 MED ORDER — SIMVASTATIN 10 MG PO TABS: 10.0000 mg | ORAL_TABLET | Freq: Every day | ORAL | 3 refills | Status: DC

## 2017-01-08 MED ORDER — ALBUTEROL SULFATE HFA 108 (90 BASE) MCG/ACT IN AERS: 1.0000 | INHALATION_SPRAY | Freq: Four times a day (QID) | RESPIRATORY_TRACT | 0 refills | Status: DC | PRN

## 2017-01-08 MED ORDER — METFORMIN HCL 500 MG PO TABS: ORAL_TABLET | ORAL | 3 refills | Status: DC

## 2017-01-08 MED ORDER — AMLODIPINE BESYLATE 10 MG PO TABS: 10.0000 mg | ORAL_TABLET | Freq: Every day | ORAL | 3 refills | Status: DC

## 2017-01-08 MED ORDER — ACYCLOVIR 200 MG PO CAPS
200.0000 mg | ORAL_CAPSULE | Freq: Two times a day (BID) | ORAL | 3 refills | Status: DC
Start: 2017-01-08 — End: 2017-07-02

## 2017-01-08 MED ORDER — LISINOPRIL-HYDROCHLOROTHIAZIDE 20-12.5 MG PO TABS: 1.0000 | ORAL_TABLET | Freq: Every day | ORAL | 3 refills | Status: DC

## 2017-01-08 MED ORDER — POTASSIUM CHLORIDE ER 10 MEQ PO TBCR: 10.0000 meq | EXTENDED_RELEASE_TABLET | Freq: Two times a day (BID) | ORAL | 3 refills | Status: DC

## 2017-01-08 NOTE — Patient Instructions (Signed)
   Thank you for giving me the opportunity to serve you today.    Your diagnosis today includes: Encounter Diagnoses  Name Primary?  . Routine general medical examination at a health care facility Yes  . Essential hypertension   . Moderate persistent asthma without complication   . Allergic rhinitis, unspecified chronicity, unspecified seasonality, unspecified trigger   . Gastroesophageal reflux disease without esophagitis   . Impaired fasting blood sugar   . Genital herpes simplex, unspecified site   . Frequent headaches   . History of migraine   . Hyperlipidemia, unspecified hyperlipidemia type   . Muscle cramp   . Obesity with serious comorbidity, unspecified classification, unspecified obesity type   . Screening for breast cancer   . Smoker   . Snoring   . 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, subsequent encounter   . Vaccine counseling   . Screening for cervical cancer   . Need for influenza vaccination    Recommendations:  See your eye doctor yearly for routine vision care.  See your dentist yearly for routine dental care including hygiene visits twice yearly.   High Blood pressure -change back to Lisinopril HCT along with amlodipine.  STOP Spironolactone.   Begin Klor Con potassium along with the Lisinopril HCT as this protect the potassium from going low.   Impaired glucose- Continue metformin, eat a healthy low fat diet, exercise regularly  Go for mammogram yearly  Stop tobacco, discussed counseling, c/t nicotine patches  Asthma - continue Advair, albuterol prn  Check your insurance coverage for shingles vaccine.

## 2017-01-08 NOTE — Progress Notes (Signed)
Subjective:   HPI  Sydney Williams is a 51 y.o. female who presents for a complete physical.   Medical team:  Crisoforo Oxford, PA-C here for primary care Dentist, eye doctor Dr. Carlean Purl, GI  Concerns: Asthma - doing fine, on Advair, 1 puff BID, albuterol occasionally  Does want to stop smoking.  Doing ok on patches, has cut way down  Compliant with amlodipine and Lisinopril and spironolactone but BPs running high and having more cramps than when she was on HCT. Checking home BPs.    Impaired glucose - Not checking glucose, doesn't have glucometer.   Compliant with Lipitor daily without c/o.  Taking aspirin daily  Genital herpes - compliant with Acyclovir BID  Last menstrual period >  2 year ago.  No vaginal c/o, no bleeding, no discharge.    Reviewed their medical, surgical, family, social, medication, and allergy history and updated chart as appropriate.  Past Medical History:  Diagnosis Date  . Asthma    2015 hospitaliation for asthma  . Diabetes mellitus    vs impaired fasting glucose  . Family history of breast cancer   . Fibroids 2010  . Genital herpes   . GERD (gastroesophageal reflux disease)   . Hyperlipidemia   . Hypertension   . Impaired fasting blood sugar   . Migraines   . Obesity   . Smoker     Past Surgical History:  Procedure Laterality Date  . COLONOSCOPY  03/13/2016   Dr. Carlean Purl, normal, repeat 2027  . CYST EXCISION     left neck/postauricular region, benign  . LAPAROSCOPIC ABDOMINAL EXPLORATION     removal of ectopic preg  . TUBAL LIGATION      Social History   Social History  . Marital status: Married    Spouse name: N/A  . Number of children: N/A  . Years of education: N/A   Occupational History  . Not on file.   Social History Main Topics  . Smoking status: Current Every Day Smoker    Packs/day: 0.25    Years: 13.00    Types: Cigarettes  . Smokeless tobacco: Never Used  . Alcohol use Yes     Comment: occ  . Drug use:  No  . Sexual activity: Yes    Birth control/ protection: Surgical   Other Topics Concern  . Not on file   Social History Narrative   Married, and her granddaughter lives with her, works as Quarry manager at [employer], exercise - activity at work, walking.  12/2016    Family History  Problem Relation Age of Onset  . Diabetes Mother   . Hypertension Mother   . Aneurysm Mother   . Stroke Mother   . Cancer Mother     cervical cancer  . Cancer Maternal Aunt     breast  . Cancer Cousin     breast/breast  . Heart disease Neg Hx   . Colon cancer Neg Hx      Current Outpatient Prescriptions:  .  acyclovir (ZOVIRAX) 200 MG capsule, Take 1 capsule (200 mg total) by mouth 2 (two) times daily., Disp: 180 capsule, Rfl: 3 .  albuterol (PROVENTIL HFA;VENTOLIN HFA) 108 (90 Base) MCG/ACT inhaler, Inhale 1-2 puffs into the lungs every 6 (six) hours as needed for wheezing or shortness of breath., Disp: 1 Inhaler, Rfl: 0 .  aspirin EC 81 MG tablet, Take 1 tablet (81 mg total) by mouth daily., Disp: 90 tablet, Rfl: 3 .  Fluticasone-Salmeterol (ADVAIR) 250-50 MCG/DOSE AEPB,  Inhale 1 puff into the lungs 2 (two) times daily., Disp: 60 each, Rfl: 11 .  ibuprofen (ADVIL,MOTRIN) 600 MG tablet, Take 1 tablet (600 mg total) by mouth every 6 (six) hours as needed (pain)., Disp: 30 tablet, Rfl: 0 .  metFORMIN (GLUCOPHAGE) 500 MG tablet, TAKE ONE TABLET BY MOUTH ONCE DAILY WITH  BREAKFAST, Disp: 90 tablet, Rfl: 3 .  nicotine (NICODERM CQ - DOSED IN MG/24 HOURS) 21 mg/24hr patch, Place 1 patch (21 mg total) onto the skin daily., Disp: 28 patch, Rfl: 0 .  simvastatin (ZOCOR) 10 MG tablet, Take 1 tablet (10 mg total) by mouth daily., Disp: 90 tablet, Rfl: 3 .  amLODipine (NORVASC) 10 MG tablet, Take 1 tablet (10 mg total) by mouth daily., Disp: 90 tablet, Rfl: 3 .  lisinopril-hydrochlorothiazide (ZESTORETIC) 20-12.5 MG tablet, Take 1 tablet by mouth daily., Disp: 90 tablet, Rfl: 3 .  potassium chloride (KLOR-CON 10) 10  MEQ tablet, Take 1 tablet (10 mEq total) by mouth 2 (two) times daily., Disp: 180 tablet, Rfl: 3  Allergies  Allergen Reactions  . Pneumococcal Vaccines Swelling    Swelling at injection site   . Vicodin [Hydrocodone-Acetaminophen] Rash    Review of Systems Constitutional: -fever, -chills, -sweats,- weight change, -decreased appetite, -fatigue Allergy: -sneezing, -itching, -congestion Dermatology: -changing moles, --rash, -lumps ENT: -runny nose, -ear pain, -sore throat, -hoarseness, -sinus pain, -teeth pain, - ringing in ears, -hearing loss, -nosebleeds Cardiology: -chest pain, -palpitations, -swelling, -difficulty breathing when lying flat, -waking up short of breath Respiratory: -cough, -shortness of breath, -difficulty breathing with exercise or exertion, -wheezing, -coughing up blood Gastroenterology: - abdominal pain, -nausea, -vomiting, -diarrhea, -constipation, -blood in stool, -changes in bowel movement, -difficulty swallowing or eating Hematology: -bleeding, -bruising  Musculoskeletal: -joint aches, -muscle aches, -joint swelling, -back pain, -neck pain, -cramping, -changes in gait Ophthalmology: denies vision changes, eye redness, itching, discharge Urology: -burning with urination, -difficulty urinating, -blood in urine, -urinary frequency, -urgency, -incontinence Neurology: -headache, -weakness, -tingling, -numbness, -memory loss, -falls, -dizziness Psychology: -depressed mood, -agitation, -sleep problems     Objective:   Physical Exam  BP (!) 162/90   Pulse 85   Ht 5\' 10"  (1.778 m)   Wt 249 lb 3.2 oz (113 kg)   LMP 06/29/2015 (Exact Date)   SpO2 97%   BMI 35.76 kg/m   Wt Readings from Last 3 Encounters:  01/08/17 249 lb 3.2 oz (113 kg)  12/23/16 248 lb 12.8 oz (112.9 kg)  12/04/16 262 lb (118.8 kg)   BP Readings from Last 3 Encounters:  01/08/17 (!) 162/90  12/23/16 (!) 158/90  12/04/16 (!) 160/101   General appearance: alert, no distress, WD/WN, obese AA  female Skin:  Tattoo right upper back "lady D", tattoo right upper chest, few scattered benign-appearing macules HEENT: normocephalic, conjunctiva/corneas normal, sclerae anicteric, PERRLA, EOMi, nares patent, no discharge or erythema, pharynx normal Oral cavity: MMM, tongue normal, teeth in normal repair, missing some molars Neck: supple, no lymphadenopathy, no thyromegaly, no masses, normal ROM , no bruits Chest: non tender, normal shape and expansion Heart: RRR, normal S1, S2, no murmurs Lungs: CTA bilaterally, no wheezes, rhonchi, or rales Abdomen: +bs, soft, non tender, non distended, no masses, no hepatomegaly, no splenomegaly, no bruits Back: non tender, normal ROM, no scoliosis Musculoskeletal: upper extremities non tender, no obvious deformity, normal ROM throughout, lower extremities non tender, no obvious deformity, normal ROM throughout Extremities: no edema, no cyanosis, no clubbing Pulses: 2+ symmetric, upper and lower extremities, normal cap refill Neurological: alert,  oriented x 3, CN2-12 intact, strength normal upper extremities and lower extremities, sensation normal throughout, DTRs 2+ throughout, no cerebellar signs, gait normal Psychiatric: normal affect, behavior normal, pleasant  Breast: nontender, no masses or lumps, no skin changes, no nipple discharge or inversion, no axillary lymphadenopathy Gyn: Normal external genitalia without lesions, vagina with normal mucosa, cervix with whitish and grayish coloration, no cervical motion tenderness, no abnormal vaginal discharge.  Uterus and adnexa not enlarged, otherwise nontender, no masses.   Exam chaperoned by nurse. Rectal: anus normal appearing   Assessment and Plan :    Encounter Diagnoses  Name Primary?  . Routine general medical examination at a health care facility Yes  . Essential hypertension   . Moderate persistent asthma without complication   . Allergic rhinitis, unspecified chronicity, unspecified  seasonality, unspecified trigger   . Gastroesophageal reflux disease without esophagitis   . Impaired fasting blood sugar   . Genital herpes simplex, unspecified site   . Frequent headaches   . History of migraine   . Hyperlipidemia, unspecified hyperlipidemia type   . Muscle cramp   . Obesity with serious comorbidity, unspecified classification, unspecified obesity type   . Screening for breast cancer   . Smoker   . Snoring   . 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, subsequent encounter   . Vaccine counseling   . Screening for cervical cancer   . Need for influenza vaccination    Physical exam - discussed healthy lifestyle, diet, exercise, preventative care, vaccinations, and addressed their concerns.   Reviewed 11/2016 EKG in chart. Reviewed 2016 PFT.  See your eye doctor yearly for routine vision care. See your dentist yearly for routine dental care including hygiene visits twice yearly. Reviewed 2017 colonoscopy report Hypertension-change back to Lisinopril HCT along with amlodipine.  STOP Spironolactone.   Begin Klor Con potassium along with the Lisinopril HCT as this protect the potassium from going low. Impaired glucose- Continue metformin, counseled on diet, exercise Go for mammogram yearly Stop tobacco, discussed counseling, c/t nicotine patches Asthma - c/t Advair, albuterol prn Counseled on the influenza virus vaccine.  Vaccine information sheet given.  Influenza vaccine given after consent obtained.  Follow-up pending labs  Sydney Williams was seen today for annual exam.  Diagnoses and all orders for this visit:  Routine general medical examination at a health care facility -     Urinalysis Dipstick -     Lipid panel -     Microalbumin / creatinine urine ratio -     VITAMIN D 25 Hydroxy (Vit-D Deficiency, Fractures) -     Cytology - PAP -     Comprehensive metabolic panel; Future  Essential hypertension -     amLODipine (NORVASC) 10 MG tablet; Take 1 tablet (10 mg  total) by mouth daily. -     lisinopril-hydrochlorothiazide (ZESTORETIC) 20-12.5 MG tablet; Take 1 tablet by mouth daily. -     potassium chloride (KLOR-CON 10) 10 MEQ tablet; Take 1 tablet (10 mEq total) by mouth 2 (two) times daily. -     aspirin EC 81 MG tablet; Take 1 tablet (81 mg total) by mouth daily. -     simvastatin (ZOCOR) 10 MG tablet; Take 1 tablet (10 mg total) by mouth daily. -     Microalbumin / creatinine urine ratio -     Comprehensive metabolic panel; Future  Moderate persistent asthma without complication -     albuterol (PROVENTIL HFA;VENTOLIN HFA) 108 (90 Base) MCG/ACT inhaler; Inhale 1-2 puffs into the lungs every  6 (six) hours as needed for wheezing or shortness of breath. -     Fluticasone-Salmeterol (ADVAIR) 250-50 MCG/DOSE AEPB; Inhale 1 puff into the lungs 2 (two) times daily.  Allergic rhinitis, unspecified chronicity, unspecified seasonality, unspecified trigger  Gastroesophageal reflux disease without esophagitis  Impaired fasting blood sugar -     metFORMIN (GLUCOPHAGE) 500 MG tablet; TAKE ONE TABLET BY MOUTH ONCE DAILY WITH  BREAKFAST  Genital herpes simplex, unspecified site -     acyclovir (ZOVIRAX) 200 MG capsule; Take 1 capsule (200 mg total) by mouth 2 (two) times daily.  Frequent headaches  History of migraine  Hyperlipidemia, unspecified hyperlipidemia type -     aspirin EC 81 MG tablet; Take 1 tablet (81 mg total) by mouth daily. -     simvastatin (ZOCOR) 10 MG tablet; Take 1 tablet (10 mg total) by mouth daily. -     Lipid panel -     Comprehensive metabolic panel; Future  Muscle cramp  Obesity with serious comorbidity, unspecified classification, unspecified obesity type  Screening for breast cancer  Smoker -     nicotine (NICODERM CQ - DOSED IN MG/24 HOURS) 21 mg/24hr patch; Place 1 patch (21 mg total) onto the skin daily.  Snoring  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, subsequent encounter  Vaccine counseling  Screening  for cervical cancer -     Cytology - PAP  Need for influenza vaccination

## 2017-01-08 NOTE — Addendum Note (Signed)
Addended by: Tyrone Apple on: 01/08/2017 02:37 PM   Modules accepted: Orders

## 2017-01-09 LAB — MICROALBUMIN / CREATININE URINE RATIO
Creatinine, Urine: 122 mg/dL (ref 20–320)
Microalb Creat Ratio: 7 mcg/mg creat (ref <30)
Microalb, Ur: 0.9 mg/dL

## 2017-01-09 LAB — VITAMIN D 25 HYDROXY (VIT D DEFICIENCY, FRACTURES): Vit D, 25-Hydroxy: 13 ng/mL — ABNORMAL LOW (ref 30–100)

## 2017-01-10 ENCOUNTER — Other Ambulatory Visit: Payer: Self-pay | Admitting: Medical

## 2017-01-10 MED ORDER — VITAMIN D (ERGOCALCIFEROL) 1.25 MG (50000 UNIT) PO CAPS: 50000.0000 [IU] | ORAL_CAPSULE | ORAL | 3 refills | Status: DC

## 2017-01-11 LAB — CYTOLOGY - PAP: Diagnosis: NEGATIVE

## 2017-01-13 LAB — COMPREHENSIVE METABOLIC PANEL
ALT: 17 U/L (ref 6–29)
AST: 21 U/L (ref 10–35)
Albumin: 3.9 g/dL (ref 3.6–5.1)
Alkaline Phosphatase: 80 U/L (ref 33–130)
BUN: 18 mg/dL (ref 7–25)
CO2: 26 mmol/L (ref 20–31)
Calcium: 9.7 mg/dL (ref 8.6–10.4)
Chloride: 103 mmol/L (ref 98–110)
Creat: 0.74 mg/dL (ref 0.50–1.05)
Glucose, Bld: 72 mg/dL (ref 65–99)
Potassium: 4.1 mmol/L (ref 3.5–5.3)
Sodium: 140 mmol/L (ref 135–146)
Total Bilirubin: 0.2 mg/dL (ref 0.2–1.2)
Total Protein: 7.4 g/dL (ref 6.1–8.1)

## 2017-01-23 ENCOUNTER — Other Ambulatory Visit: Payer: Self-pay | Admitting: Occupational Medicine

## 2017-01-23 ENCOUNTER — Ambulatory Visit: Payer: Self-pay

## 2017-01-23 DIAGNOSIS — M79672 Pain in left foot: Secondary | ICD-10-CM

## 2017-01-23 DIAGNOSIS — M25572 Pain in left ankle and joints of left foot: Secondary | ICD-10-CM

## 2017-01-24 ENCOUNTER — Other Ambulatory Visit: Payer: Self-pay | Admitting: Medical

## 2017-01-27 NOTE — Telephone Encounter (Signed)
Is this okay to refill? 

## 2017-02-03 ENCOUNTER — Other Ambulatory Visit: Payer: Self-pay | Admitting: Medical

## 2017-02-03 DIAGNOSIS — Z1231 Encounter for screening mammogram for malignant neoplasm of breast: Secondary | ICD-10-CM

## 2017-02-04 ENCOUNTER — Ambulatory Visit
Admission: RE | Admit: 2017-02-04 | Discharge: 2017-02-04 | Disposition: A | Discharge status: Home / Self Care | Payer: BLUE CROSS/BLUE SHIELD | Source: Ambulatory Visit | Attending: Medical | Admitting: Medical

## 2017-02-04 DIAGNOSIS — Z1231 Encounter for screening mammogram for malignant neoplasm of breast: Secondary | ICD-10-CM

## 2017-03-10 ENCOUNTER — Emergency Department (HOSPITAL_COMMUNITY)
Admission: EM | Admit: 2017-03-10 | Discharge: 2017-03-10 | Disposition: A | Discharge status: Home / Self Care | Payer: BLUE CROSS/BLUE SHIELD | Source: Home / Self Care | Attending: Emergency Medicine | Admitting: Emergency Medicine

## 2017-03-10 ENCOUNTER — Encounter (HOSPITAL_COMMUNITY): Payer: Self-pay

## 2017-03-10 ENCOUNTER — Emergency Department (HOSPITAL_COMMUNITY)
Admission: EM | Admit: 2017-03-10 | Discharge: 2017-03-10 | Disposition: A | Discharge status: Home / Self Care | Payer: BLUE CROSS/BLUE SHIELD | Attending: Emergency Medicine | Admitting: Emergency Medicine

## 2017-03-10 ENCOUNTER — Encounter (HOSPITAL_COMMUNITY): Payer: Self-pay | Admitting: Emergency Medicine

## 2017-03-10 DIAGNOSIS — J45909 Unspecified asthma, uncomplicated: Secondary | ICD-10-CM

## 2017-03-10 DIAGNOSIS — Z79899 Other long term (current) drug therapy: Secondary | ICD-10-CM

## 2017-03-10 DIAGNOSIS — R51 Headache: Secondary | ICD-10-CM

## 2017-03-10 DIAGNOSIS — Z7984 Long term (current) use of oral hypoglycemic drugs: Secondary | ICD-10-CM | POA: Insufficient documentation

## 2017-03-10 DIAGNOSIS — I10 Essential (primary) hypertension: Secondary | ICD-10-CM | POA: Insufficient documentation

## 2017-03-10 DIAGNOSIS — R600 Localized edema: Secondary | ICD-10-CM | POA: Insufficient documentation

## 2017-03-10 DIAGNOSIS — E119 Type 2 diabetes mellitus without complications: Secondary | ICD-10-CM | POA: Insufficient documentation

## 2017-03-10 DIAGNOSIS — T782XXA Anaphylactic shock, unspecified, initial encounter: Secondary | ICD-10-CM

## 2017-03-10 DIAGNOSIS — F17210 Nicotine dependence, cigarettes, uncomplicated: Secondary | ICD-10-CM | POA: Insufficient documentation

## 2017-03-10 DIAGNOSIS — Z7982 Long term (current) use of aspirin: Secondary | ICD-10-CM

## 2017-03-10 DIAGNOSIS — R519 Headache, unspecified: Secondary | ICD-10-CM

## 2017-03-10 DIAGNOSIS — T7840XA Allergy, unspecified, initial encounter: Secondary | ICD-10-CM | POA: Diagnosis present

## 2017-03-10 DIAGNOSIS — R609 Edema, unspecified: Secondary | ICD-10-CM

## 2017-03-10 LAB — COMPREHENSIVE METABOLIC PANEL
ALT: 22 U/L (ref 14–54)
AST: 27 U/L (ref 15–41)
Albumin: 3.6 g/dL (ref 3.5–5.0)
Alkaline Phosphatase: 76 U/L (ref 38–126)
Anion gap: 13 (ref 5–15)
BUN: 11 mg/dL (ref 6–20)
CO2: 25 mmol/L (ref 22–32)
Calcium: 8.7 mg/dL — ABNORMAL LOW (ref 8.9–10.3)
Chloride: 100 mmol/L — ABNORMAL LOW (ref 101–111)
Creatinine, Ser: 0.56 mg/dL (ref 0.44–1.00)
GFR calc Af Amer: >60 mL/min (ref >60)
GFR calc non Af Amer: >60 mL/min (ref >60)
Glucose, Bld: 95 mg/dL (ref 65–99)
Potassium: 3.1 mmol/L — ABNORMAL LOW (ref 3.5–5.1)
Sodium: 138 mmol/L (ref 135–145)
Total Bilirubin: 0.3 mg/dL (ref 0.3–1.2)
Total Protein: 7.9 g/dL (ref 6.5–8.1)

## 2017-03-10 LAB — CBC WITH DIFFERENTIAL/PLATELET
Basophils Absolute: 0 10*3/uL (ref 0.0–0.1)
Basophils Absolute: 0 10*3/uL (ref 0.0–0.1)
Basophils Relative: 0 %
Basophils Relative: 0 %
Eosinophils Absolute: 0.3 10*3/uL (ref 0.0–0.7)
Eosinophils Absolute: 0.1 10*3/uL (ref 0.0–0.7)
Eosinophils Relative: 7 %
Eosinophils Relative: 2 %
HCT: 39.6 % (ref 36.0–46.0)
HCT: 39.4 % (ref 36.0–46.0)
Hemoglobin: 13.2 g/dL (ref 12.0–15.0)
Hemoglobin: 13.4 g/dL (ref 12.0–15.0)
Lymphocytes Relative: 36 %
Lymphocytes Relative: 45 %
Lymphs Abs: 1.5 10*3/uL (ref 0.7–4.0)
Lymphs Abs: 2.6 10*3/uL (ref 0.7–4.0)
MCH: 32.3 pg (ref 26.0–34.0)
MCH: 33 pg (ref 26.0–34.0)
MCHC: 33.3 g/dL (ref 30.0–36.0)
MCHC: 34 g/dL (ref 30.0–36.0)
MCV: 96.8 fL (ref 78.0–100.0)
MCV: 97 fL (ref 78.0–100.0)
Monocytes Absolute: 0.4 10*3/uL (ref 0.1–1.0)
Monocytes Absolute: 0.5 10*3/uL (ref 0.1–1.0)
Monocytes Relative: 10 %
Monocytes Relative: 9 %
Neutro Abs: 2.1 10*3/uL (ref 1.7–7.7)
Neutro Abs: 2.5 10*3/uL (ref 1.7–7.7)
Neutrophils Relative %: 47 %
Neutrophils Relative %: 44 %
Platelets: 230 10*3/uL (ref 150–400)
Platelets: 234 10*3/uL (ref 150–400)
RBC: 4.09 MIL/uL (ref 3.87–5.11)
RBC: 4.06 MIL/uL (ref 3.87–5.11)
RDW: 14.2 % (ref 11.5–15.5)
RDW: 14.3 % (ref 11.5–15.5)
WBC: 4.3 10*3/uL (ref 4.0–10.5)
WBC: 5.8 10*3/uL (ref 4.0–10.5)

## 2017-03-10 LAB — I-STAT TROPONIN, ED: Troponin i, poc: 0 ng/mL (ref 0.00–0.08)

## 2017-03-10 LAB — BASIC METABOLIC PANEL
Anion gap: 9 (ref 5–15)
BUN: 13 mg/dL (ref 6–20)
CO2: 27 mmol/L (ref 22–32)
Calcium: 8.3 mg/dL — ABNORMAL LOW (ref 8.9–10.3)
Chloride: 103 mmol/L (ref 101–111)
Creatinine, Ser: 0.75 mg/dL (ref 0.44–1.00)
GFR calc Af Amer: >60 mL/min (ref >60)
GFR calc non Af Amer: >60 mL/min (ref >60)
Glucose, Bld: 148 mg/dL — ABNORMAL HIGH (ref 65–99)
Potassium: 3.7 mmol/L (ref 3.5–5.1)
Sodium: 139 mmol/L (ref 135–145)

## 2017-03-10 LAB — BRAIN NATRIURETIC PEPTIDE: B Natriuretic Peptide: 19.4 pg/mL (ref 0.0–100.0)

## 2017-03-10 MED ORDER — DIPHENHYDRAMINE HCL 50 MG/ML IJ SOLN
50.0000 mg | Freq: Once | INTRAMUSCULAR | Status: AC
  Administered 2017-03-10: 50 mg via INTRAVENOUS

## 2017-03-10 MED ORDER — SODIUM CHLORIDE 0.9 % IV SOLN
INTRAVENOUS | Status: DC
  Administered 2017-03-10: 19:00:00 via INTRAVENOUS

## 2017-03-10 MED ORDER — EPINEPHRINE 0.3 MG/0.3ML IJ SOAJ: 0.3000 mg | Freq: Once | INTRAMUSCULAR | 1 refills | Status: AC

## 2017-03-10 MED ORDER — POTASSIUM CHLORIDE CRYS ER 20 MEQ PO TBCR
40.0000 meq | EXTENDED_RELEASE_TABLET | Freq: Once | ORAL | Status: AC
  Administered 2017-03-10: 40 meq via ORAL
  Filled 2017-03-10: qty 2

## 2017-03-10 MED ORDER — PREDNISONE 50 MG PO TABS: 50.0000 mg | ORAL_TABLET | Freq: Every day | ORAL | 0 refills | Status: DC

## 2017-03-10 MED ORDER — SODIUM CHLORIDE 0.9 % IV BOLUS (SEPSIS)
1000.0000 mL | Freq: Once | INTRAVENOUS | Status: AC
  Administered 2017-03-10: 1000 mL via INTRAVENOUS

## 2017-03-10 MED ORDER — EPINEPHRINE 0.3 MG/0.3ML IJ SOAJ
INTRAMUSCULAR | Status: AC
  Administered 2017-03-10: 0.3 mg via INTRAMUSCULAR
  Filled 2017-03-10: qty 0.3

## 2017-03-10 MED ORDER — FAMOTIDINE IN NACL 20-0.9 MG/50ML-% IV SOLN
20.0000 mg | Freq: Once | INTRAVENOUS | Status: AC
  Administered 2017-03-10: 20 mg via INTRAVENOUS
  Filled 2017-03-10: qty 50

## 2017-03-10 MED ORDER — EPINEPHRINE 0.3 MG/0.3ML IJ SOAJ
0.3000 mg | Freq: Once | INTRAMUSCULAR | Status: AC
  Administered 2017-03-10: 0.3 mg via INTRAMUSCULAR

## 2017-03-10 MED ORDER — METHYLPREDNISOLONE SODIUM SUCC 125 MG IJ SOLR
125.0000 mg | Freq: Once | INTRAMUSCULAR | Status: AC
  Administered 2017-03-10: 125 mg via INTRAVENOUS

## 2017-03-10 MED ORDER — DIPHENHYDRAMINE HCL 50 MG/ML IJ SOLN
INTRAMUSCULAR | Status: AC
  Administered 2017-03-10: 50 mg via INTRAVENOUS
  Filled 2017-03-10: qty 1

## 2017-03-10 MED ORDER — METHYLPREDNISOLONE SODIUM SUCC 125 MG IJ SOLR
INTRAMUSCULAR | Status: AC
  Filled 2017-03-10: qty 2

## 2017-03-10 NOTE — Discharge Instructions (Signed)
Please take Benadryl scheduled as directed over-the-counter. Please take steroids for the next several days. Please call to schedule an appointment with a primary care physician for allergy testing and further management. If any symptoms change or worsen, please return to the nearest emergency department.

## 2017-03-10 NOTE — ED Triage Notes (Signed)
States since yesterday headache and dizziness with legs swelling states left arm numbness off and on. No chest pain voiced. With blurred vision in both eyes.

## 2017-03-10 NOTE — ED Notes (Signed)
Patient has notable 2+ pitting in Bilateral lower extremities.  Patient has a notable 20 point increase in her systolic BP from her stated normal along with slight blurry vision and a headache

## 2017-03-10 NOTE — ED Provider Notes (Addendum)
[facility] DEPT Provider Note   CSN: [id number] Arrival date & time: 03/10/17  1705     History   Chief Complaint Chief Complaint  Patient presents with  . Allergic Reaction  . Oral Swelling    HPI Sydney Williams is a 51 y.o. female.  The history is provided by the patient and the spouse. No language interpreter was used.  Allergic Reaction  Presenting symptoms: difficulty breathing, rash and swelling   Presenting symptoms: no wheezing   Severity:  Severe Duration:  1 hour Prior allergic episodes:  No prior episodes Context: medications (potassium supplementation)   Relieved by:  Nothing Worsened by:  Nothing Ineffective treatments:  None tried   Past Medical History:  Diagnosis Date  . Asthma    2015 hospitaliation for asthma  . Diabetes mellitus    vs impaired fasting glucose  . Family history of breast cancer   . Fibroids 2010  . Genital herpes   . GERD (gastroesophageal reflux disease)   . Hyperlipidemia   . Hypertension   . Impaired fasting blood sugar   . Migraines   . Obesity   . Smoker     Patient Active Problem List   Diagnosis Date Noted  . Vaccine counseling 01/08/2017  . 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 Screening for cervical cancer 01/08/2017  . Routine general medical examination at a health care facility 01/08/2017  . Snoring 09/11/2016  . History of migraine 09/11/2016  . Frequent headaches 09/11/2016  . Muscle cramp 09/11/2016  . Encounter for health maintenance examination in adult 12/04/2015  . Heel pain, bilateral 12/04/2015  . Screening for breast cancer 12/04/2015  . Need for influenza vaccination 12/04/2015  . Moderate persistent asthma 06/01/2015  . Gastroesophageal reflux disease without esophagitis 06/01/2015  . Rhinitis, allergic 06/01/2015  . Genital herpes 11/30/2014  . Smoker 11/30/2014  . Essential hypertension 11/30/2014  . Hyperlipidemia 11/30/2014  . Impaired fasting blood sugar 11/30/2014    . Obesity 07/02/2012    Past Surgical History:  Procedure Laterality Date  . COLONOSCOPY  03/13/2016   Dr. Carlean Purl, normal, repeat 2027  . CYST EXCISION     left neck/postauricular region, benign  . LAPAROSCOPIC ABDOMINAL EXPLORATION     removal of ectopic preg  . TUBAL LIGATION      OB History    Gravida Para Term Preterm AB Living   5 1 1   2 3    SAB TAB Ectopic Multiple Live Births   1   1           Home Medications    Prior to Admission medications   Medication Sig Start Date End Date Taking? Authorizing Provider  acyclovir (ZOVIRAX) 200 MG capsule Take 1 capsule (200 mg total) by mouth 2 (two) times daily. 01/08/17   Tysinger, Camelia Eng, PA-C  albuterol (PROVENTIL HFA;VENTOLIN HFA) 108 (90 Base) MCG/ACT inhaler Inhale 1-2 puffs into the lungs every 6 (six) hours as needed for wheezing or shortness of breath. 01/08/17   Tysinger, Camelia Eng, PA-C  amLODipine (NORVASC) 5 MG tablet Take 5 mg by mouth daily.    [provider]  aspirin EC 81 MG tablet Take 1 tablet (81 mg total) by mouth daily. 01/08/17   Tysinger, Camelia Eng, PA-C  Fluticasone-Salmeterol (ADVAIR) 250-50 MCG/DOSE AEPB Inhale 1 puff into the lungs 2 (two) times daily. 01/08/17   Tysinger, Camelia Eng, PA-C  ibuprofen (ADVIL,MOTRIN) 600 MG tablet Take 1 tablet (600 mg total) by mouth every 6 (six)  hours as needed (pain). 10/25/16   Wojeck, Bernadene Bell, NP  lisinopril-hydrochlorothiazide (ZESTORETIC) 20-12.5 MG tablet Take 1 tablet by mouth daily. 01/08/17   Tysinger, Camelia Eng, PA-C  loratadine (CLARITIN) 10 MG tablet Take 10 mg by mouth daily.    [provider]  metFORMIN (GLUCOPHAGE) 500 MG tablet TAKE ONE TABLET BY MOUTH ONCE DAILY WITH  BREAKFAST Patient taking differently: Take 500 mg by mouth daily with breakfast.  01/08/17   Tysinger, Camelia Eng, PA-C  nicotine (NICODERM CQ - DOSED IN MG/24 HOURS) 21 mg/24hr patch Place 1 patch (21 mg total) onto the skin daily. 01/08/17   Tysinger, Camelia Eng, PA-C  simvastatin  (ZOCOR) 10 MG tablet Take 1 tablet (10 mg total) by mouth daily. 01/08/17   Tysinger, Camelia Eng, PA-C  Vitamin D, Ergocalciferol, (DRISDOL) 50000 units CAPS capsule Take 1 capsule (50,000 Units total) by mouth every 7 (seven) days. Patient taking differently: Take 50,000 Units by mouth every Sunday.  01/10/17   Tysinger, Camelia Eng, PA-C    Family History Family History  Problem Relation Age of Onset  . Diabetes Mother   . Hypertension Mother   . Aneurysm Mother   . Stroke Mother   . Cancer Mother        cervical cancer  . Cancer Maternal Aunt        breast  . Cancer Cousin        breast/breast  . Heart disease Neg Hx   . Colon cancer Neg Hx     Social History Social History  Substance Use Topics  . Smoking status: Current Every Day Smoker    Packs/day: 0.25    Years: 13.00    Types: Cigarettes  . Smokeless tobacco: Never Used  . Alcohol use Yes     Comment: occ     Allergies   Pneumococcal vaccines and Vicodin [hydrocodone-acetaminophen]   Review of Systems Review of Systems  Constitutional: Negative for activity change, chills, diaphoresis, fatigue and fever.  HENT: Positive for facial swelling and sore throat (throat closing sensation). Negative for congestion and rhinorrhea.   Eyes: Negative for visual disturbance.  Respiratory: Positive for shortness of breath. Negative for cough, chest tightness, wheezing and stridor.   Cardiovascular: Negative for chest pain, palpitations and leg swelling.  Gastrointestinal: Negative for abdominal distention, abdominal pain, constipation, diarrhea, nausea and vomiting.  Genitourinary: Negative for difficulty urinating, dysuria, flank pain, frequency, hematuria, menstrual problem, pelvic pain, vaginal bleeding and vaginal discharge.  Musculoskeletal: Negative for back pain and neck pain.  Skin: Positive for rash. Negative for wound.  Neurological: Negative for dizziness, weakness, light-headedness, numbness and headaches.    Psychiatric/Behavioral: Negative for agitation and confusion.  All other systems reviewed and are negative.    Physical Exam Updated Vital Signs BP (!) 160/95 (BP Location: Right Arm)   Pulse (!) 115   Temp 98.1 F (36.7 C) (Oral)   Resp (!) 22   LMP 06/29/2015 (Exact Date)   SpO2 95%   Physical Exam  Constitutional: She appears well-developed and well-nourished. No distress.  HENT:  Head: Atraumatic.  Mouth/Throat: No oral lesions. No uvula swelling. No oropharyngeal exudate, posterior oropharyngeal edema, posterior oropharyngeal erythema or tonsillar abscesses.    Eyes: Conjunctivae and EOM are normal. Pupils are equal, round, and reactive to light.  Neck: Normal range of motion. Neck supple.  Cardiovascular: Normal rate and regular rhythm.   No murmur heard. Pulmonary/Chest: Effort normal and breath sounds normal. No stridor. No respiratory distress. She has  no wheezes. She exhibits no tenderness.  Abdominal: Soft. There is no tenderness.  Musculoskeletal: She exhibits no edema or tenderness.  Neurological: She is alert. No sensory deficit. She exhibits normal muscle tone.  Skin: Skin is warm and dry. Capillary refill takes less than 2 seconds. Rash noted. She is not diaphoretic. There is erythema.  Psychiatric: She has a normal mood and affect.  Nursing note and vitals reviewed.    ED Treatments / Results  Labs (all labs ordered are listed, but only abnormal results are displayed) Labs Reviewed  BASIC METABOLIC PANEL - Abnormal; Notable for the following:       Result Value   Glucose, Bld 148 (*)    Calcium 8.3 (*)    All other components within normal limits  CBC WITH DIFFERENTIAL/PLATELET    EKG  EKG Interpretation  Date/Time:  Monday Mar 10 2017 17:44:41 EDT Ventricular Rate:  92 PR Interval:    QRS Duration: 98 QT Interval:  379 QTC Calculation: 469 R Axis:   -8 Text Interpretation:  Sinus rhythm Consider right atrial enlargement Anterior infarct,  old Baseline wander in lead(s) V5 V6 When compared to prior, No significant chacnges.  No STEMI Confirmed by [facility] MD, [place] [phone]) on 03/10/2017 5:48:12 PM       Radiology No results found.  Procedures Procedures (including critical care time)  CRITICAL CARE Performed by: Gwenyth Allegra Tegeler Total critical care time: 45 minutes Critical care time was exclusive of separately billable procedures and treating other patients. Critical care was necessary to treat or prevent imminent or life-threatening deterioration. Critical care was time spent personally by me on the following activities: development of treatment plan with patient and/or surrogate as well as nursing, discussions with consultants, evaluation of patient's response to treatment, examination of patient, obtaining history from patient or surrogate, ordering and performing treatments and interventions, ordering and review of laboratory studies, ordering and review of radiographic studies, pulse oximetry and re-evaluation of patient's condition.   Medications Ordered in ED Medications  sodium chloride 0.9 % bolus 1,000 mL (0 mLs Intravenous Stopped 03/10/17 1849)    And  0.9 %  sodium chloride infusion ( Intravenous Stopped 03/10/17 2223)  EPINEPHrine (EPI-PEN) injection 0.3 mg (0.3 mg Intramuscular Given 03/10/17 1726)  famotidine (PEPCID) IVPB 20 mg premix (0 mg Intravenous Stopped 03/10/17 1809)  methylPREDNISolone sodium succinate (SOLU-MEDROL) 125 mg/2 mL injection 125 mg (125 mg Intravenous Given 03/10/17 1727)  diphenhydrAMINE (BENADRYL) injection 50 mg (50 mg Intravenous Given 03/10/17 1726)     Initial Impression / Assessment and Plan / ED Course  I have reviewed the triage vital signs and the nursing notes.  Pertinent labs & imaging results that were available during my care of the patient were reviewed by me and considered in my medical decision making (see chart for details).     Sydney Williams is a 51 y.o.  female with past medical history significant for hypertension, diabetes, hyperlipidemia, asthma, and recent visit this morning for bilateral lower extremity edema who presents with allergic reaction. Patient says that her workup today was unrevealing aside from slightly decreased potassium. Patient was given prescription for potassium pills. Patient says that this afternoon, she began having symptoms of allergic reaction. She had an urticarial rash develop on her neck and chest. She then had sensation of throat swelling and tongue swelling. Patient had voice change. Patient said she was having some shortness breath and difficulty breathing. Patient presented for evaluation. He denied any chest pain, palpitations,  syncope, nausea, or vomiting.  On my exam, patient does have lip swelling and tongue swelling. Patient's urticarial rash that is very pruritic. Patient had no wheezing on exam. Abdomen and chest were nontender. No focal neurologic deficits. Full range of motion of neck. No stridor.   Given the oral and airway involvement of allergic reaction, patient will be given epinephrine, steroids, Benadryl, and Pepcid. Patient given fluids and workup repeated.  Anticipate reassessment after medications.        After medications and appeared of observation, patient complete resolution of her rash, lip swelling, and tongue swelling. She had no more sensation of throat closing.  Feel patient is status for discharge after her demonstrated stability. Patient will be given prescription for steroids and EpiPen. Patient will continue taking over-the-counter Benadryl. Next  Patient start to follow-up with PCP for further allergy testing and given strict return precautions. Patient was understanding of plan of care and had no other questions or concerns. Patient discharged in good condition with resolution of presenting allergic reaction.   Final Clinical Impressions(s) / ED Diagnoses   Final diagnoses:    Anaphylaxis, initial encounter    New Prescriptions Discharge Medication List as of 03/10/2017 11:02 PM    START taking these medications   Details  EPINEPHrine 0.3 mg/0.3 mL IJ SOAJ injection Inject 0.3 mLs (0.3 mg total) into the muscle once., Starting Mon 03/10/2017, Print    predniSONE (DELTASONE) 50 MG tablet Take 1 tablet (50 mg total) by mouth daily., Starting Mon 03/10/2017, Until Sat 03/15/2017, Print        Clinical Impression: 1. Anaphylaxis, initial encounter     Disposition: Discharge  Condition: Good  I have discussed the results, Dx and Tx plan with the pt(& family if present). He/she/they expressed understanding and agree(s) with the plan. Discharge instructions discussed at great length. Strict return precautions discussed and pt &/or family have verbalized understanding of the instructions. No further questions at time of discharge.    Discharge Medication List as of 03/10/2017 11:02 PM    START taking these medications   Details  EPINEPHrine 0.3 mg/0.3 mL IJ SOAJ injection Inject 0.3 mLs (0.3 mg total) into the muscle once., Starting Mon 03/10/2017, Print    predniSONE (DELTASONE) 50 MG tablet Take 1 tablet (50 mg total) by mouth daily., Starting Mon 03/10/2017, Until Sat 03/15/2017, Print        Follow Up: [facility] [place] [id number] [phone]  Schedule an appointment as soon as possible for a visit    [facility] [place] [id number] [place] [phone]  If symptoms worsen     Tegeler, Gwenyth Allegra, MD 03/11/17 0123    Tegeler, Gwenyth Allegra, MD 03/11/17 [phone]

## 2017-03-10 NOTE — ED Notes (Signed)
Discharge instructions reviewed. Follow up care reviewed. Pt ambulatory & took all belongings. Given work note.

## 2017-03-10 NOTE — ED Triage Notes (Addendum)
Patient c/o possible allergic reaction. Patient feels tongue is swollen. Patient was seen earlier here and states that she was given potassium tablets. Patient denies anything to eat or drink recently that could be cause. Patient c/o trouble swallowing.

## 2017-03-10 NOTE — ED Notes (Signed)
Pt and husband given ginger ale

## 2017-03-10 NOTE — ED Provider Notes (Signed)
[facility] DEPT Provider Note   CSN: [id number] Arrival date & time: 03/10/17  1607     History   Chief Complaint Chief Complaint  Patient presents with  . Dizziness    HPI Sydney Williams is a 51 y.o. female.  HPI   Presents with concern for leg swelling and headache Symptoms started yesterday A long time ago had similar swelling in the legs, is on hctz Headache has been coming and going. Hx of migraines, this is not a migraine. 8/10 in severity, frontal headache, taking ibuprofen with some relief  In triage reported dizziness, when asked reports feeling lightheaded at work yesterday, reports air conditioner not working there and was working in [employer] blow[employer]. 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 Blurred vision sometimes when asked but wears glasses.  No chest pain, no shortness of breath, no cough, no fever, no no weakness/numbness, no facial drooping, no visual changes, no trouble walking or talking.  BP has increased from baseline, normally 120/76, lately higher blood pressures.      Past Medical History:  Diagnosis Date  . Asthma    2015 hospitaliation for asthma  . Diabetes mellitus    vs impaired fasting glucose  . Family history of breast cancer   . Fibroids 2010  . Genital herpes   . GERD (gastroesophageal reflux disease)   . Hyperlipidemia   . Hypertension   . Impaired fasting blood sugar   . Migraines   . Obesity   . Smoker     Patient Active Problem List   Diagnosis Date Noted  . Vaccine counseling 01/08/2017  . 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 Screening for cervical cancer 01/08/2017  . Routine general medical examination at a health care facility 01/08/2017  . Snoring 09/11/2016  . History of migraine 09/11/2016  . Frequent headaches 09/11/2016  . Muscle cramp 09/11/2016  .  Encounter for health maintenance examination in adult 12/04/2015  . Heel pain, bilateral 12/04/2015  . Screening for breast cancer 12/04/2015  . Need for influenza vaccination 12/04/2015  . Moderate persistent asthma 06/01/2015  . Gastroesophageal reflux disease without esophagitis 06/01/2015  . Rhinitis, allergic 06/01/2015  . Genital herpes 11/30/2014  . Smoker 11/30/2014  . Essential hypertension 11/30/2014  . Hyperlipidemia 11/30/2014  . Impaired fasting blood sugar 11/30/2014  . Obesity 07/02/2012    Past Surgical History:  Procedure Laterality Date  . COLONOSCOPY  03/13/2016   Dr. Carlean Purl, normal, repeat 2027  . CYST EXCISION     left neck/postauricular region, benign  . LAPAROSCOPIC ABDOMINAL EXPLORATION     removal of ectopic preg  . TUBAL LIGATION      OB History    Gravida Para Term Preterm AB Living   5 1 1   2 3    SAB TAB Ectopic Multiple Live Births   1   1           Home Medications    Prior to Admission medications   Medication Sig Start Date End Date Taking? Authorizing Provider  acyclovir (ZOVIRAX) 200 MG capsule Take 1 capsule (200 mg total) by mouth 2 (two) times daily. 01/08/17  Yes Tysinger, Camelia Eng, PA-C  albuterol (PROVENTIL HFA;VENTOLIN HFA) 108 (90 Base) MCG/ACT inhaler Inhale 1-2 puffs into the lungs every 6 (six) hours as needed for wheezing or shortness of breath. 01/08/17  Yes Tysinger, Camelia Eng, PA-C  amLODipine (NORVASC) 5 MG tablet Take 5 mg by mouth daily.   Yes [provider]  aspirin EC 81 MG tablet Take 1 tablet (81 mg total) by mouth daily. 01/08/17  Yes Tysinger, Camelia Eng, PA-C  Fluticasone-Salmeterol (ADVAIR) 250-50 MCG/DOSE AEPB Inhale 1 puff into the lungs 2 (two) times daily. 01/08/17  Yes Tysinger, Camelia Eng, PA-C  ibuprofen (ADVIL,MOTRIN) 600 MG tablet Take 1 tablet (600 mg total) by mouth every 6 (six) hours as needed (pain). 10/25/16  Yes Wojeck, Bernadene Bell, NP  lisinopril-hydrochlorothiazide (ZESTORETIC) 20-12.5 MG tablet Take  1 tablet by mouth daily. 01/08/17  Yes Tysinger, Camelia Eng, PA-C  loratadine (CLARITIN) 10 MG tablet Take 10 mg by mouth daily.   Yes [provider]  metFORMIN (GLUCOPHAGE) 500 MG tablet TAKE ONE TABLET BY MOUTH ONCE DAILY WITH  BREAKFAST Patient taking differently: Take 500 mg by mouth daily with breakfast.  01/08/17  Yes Tysinger, Camelia Eng, PA-C  nicotine (NICODERM CQ - DOSED IN MG/24 HOURS) 21 mg/24hr patch Place 1 patch (21 mg total) onto the skin daily. Patient not taking: Reported on 03/10/2017 01/08/17  Yes Tysinger, Camelia Eng, PA-C  simvastatin (ZOCOR) 10 MG tablet Take 1 tablet (10 mg total) by mouth daily. 01/08/17  Yes Tysinger, Camelia Eng, PA-C  Vitamin D, Ergocalciferol, (DRISDOL) 50000 units CAPS capsule Take 1 capsule (50,000 Units total) by mouth every 7 (seven) days. Patient taking differently: Take 50,000 Units by mouth every Sunday.  01/10/17  Yes Tysinger, Camelia Eng, PA-C  predniSONE (DELTASONE) 50 MG tablet Take 1 tablet (50 mg total) by mouth daily. 03/10/17 03/15/17  Tegeler, Gwenyth Allegra, MD    Family History Family History  Problem Relation Age of Onset  . Diabetes Mother   . Hypertension Mother   . Aneurysm Mother   . Stroke Mother   . Cancer Mother        cervical cancer  . Cancer Maternal Aunt        breast  . Cancer Cousin        breast/breast  . Heart disease Neg Hx   . Colon cancer Neg Hx     Social History Social History  Substance Use Topics  . Smoking status: Current Every Day Smoker    Packs/day: 0.25    Years: 13.00    Types: Cigarettes  . Smokeless tobacco: Never Used  . Alcohol use Yes     Comment: occ     Allergies   Potassium chloride; Pneumococcal vaccines; and Vicodin [hydrocodone-acetaminophen]   Review of Systems Review of Systems  Constitutional: Negative for fever.  HENT: Negative for sore throat.   Eyes: Negative for visual disturbance.  Respiratory: Negative for cough and shortness of breath.   Cardiovascular: Positive for  leg swelling. Negative for chest pain.  Gastrointestinal: Negative for abdominal pain, constipation, diarrhea, nausea and vomiting.  Genitourinary: Negative for difficulty urinating.  Musculoskeletal: Negative for back pain and neck pain.  Skin: Negative for rash.  Neurological: Positive for headaches. Negative for syncope, facial asymmetry, speech difficulty, weakness and numbness. Light-headedness: denies numbness, intermittent tingling.     Physical Exam Updated Vital Signs BP (!) 150/94   Pulse 81   Temp 97.7 F (36.5 C) (Oral)   Resp 14   Ht 5\' 11"  (1.803 m)   Wt 242 lb (109.8 kg)   LMP 06/29/2015 (Exact Date)   SpO2 100%   BMI 33.75 kg/m   Physical Exam  Constitutional: She is  oriented to person, place, and time. She appears well-developed and well-nourished. No distress.  HENT:  Head: Normocephalic and atraumatic.  Eyes: Conjunctivae and EOM are normal.  Neck: Normal range of motion.  Cardiovascular: Normal rate, regular rhythm, normal heart sounds and intact distal pulses.  Exam reveals no gallop and no friction rub.   No murmur heard. Pulmonary/Chest: Effort normal and breath sounds normal. No respiratory distress. She has no wheezes. She has no rales.  Abdominal: Soft. She exhibits no distension. There is no tenderness. There is no guarding.  Musculoskeletal: She exhibits no edema or tenderness.  Neurological: She is alert and oriented to person, place, and time. She has normal strength. No cranial nerve deficit or sensory deficit. Coordination normal. GCS eye subscore is 4. GCS verbal subscore is 5. GCS motor subscore is 6.  Skin: Skin is warm and dry. No rash noted. She is not diaphoretic. No erythema.  Nursing note and vitals reviewed.    ED Treatments / Results  Labs (all labs ordered are listed, but only abnormal results are displayed) Labs Reviewed  COMPREHENSIVE METABOLIC PANEL - Abnormal; Notable for the following:       Result Value   Potassium 3.1 (*)     Chloride 100 (*)    Calcium 8.7 (*)    All other components within normal limits  CBC WITH DIFFERENTIAL/PLATELET  BRAIN NATRIURETIC PEPTIDE  I-STAT TROPOININ, ED    EKG  EKG Interpretation  Date/Time:  Monday Mar 10 2017 10:07:29 EDT Ventricular Rate:  83 PR Interval:    QRS Duration: 97 QT Interval:  399 QTC Calculation: 469 R Axis:   11 Text Interpretation:  Sinus rhythm Probable left atrial enlargement Anteroseptal infarct, age indeterminate No significant change since last tracing Confirmed by [place] ([id number]) on 03/10/2017 10:13:35 AM       Radiology No results found.  Procedures Procedures (including critical care time)  Medications Ordered in ED Medications  potassium chloride SA (K-DUR,KLOR-CON) CR tablet 40 mEq (40 mEq Oral Given 03/10/17 1054)     Initial Impression / Assessment and Plan / ED Course  I have reviewed the triage vital signs and the nursing notes.  Pertinent labs & imaging results that were available during my care of the patient were reviewed by me and considered in my medical decision making (see chart for details).     51 year old female with a history of asthma, diabetes, hypertension, hyperlipidemia, smoking, obesity presents with concern for bilateral leg swelling and headache. 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 Denies any dizziness today. Patient had reported intermittent feeling that her arm is going asleep, without other neurologic symptoms, no numbness on exam, normal neurologic exam during episode and doubt CVA or TIA. She has good pulses bilaterally, doubt acute arterial occlusion. Denies chest pain, shortness of breath. EKG and troponin within normal limits. Have low suspicion this is a cardiac equivalent, without other associated symptoms. The symptoms are related to positioning,  peripheral nerve compression or pathology.  Feel continued outpatient work up is appropriate for arm symptoms.  Regarding headache, symptoms beginning slowly, intermittent, no trauma, normal neurologic exam, doubt subarachnoid hemorrhage, meningitis, subdural hematoma.  She currently has no headache. Patient reports blood pressures are normally 120s, however there 140s recently. Given mild elevation in blood pressures, recommended to her discussion with her primary care physician regarding further management.  Regarding bilateral leg swelling, labs show mild hypokalemia, given K.  No dyspnea or sign of acute pulmonary edema by history or exam. BNP WNL. Suspect venous insufficency.  Recommend outpatient follow up with PCP. Patient discharged in stable condition with understanding of reasons to return.   Final Clinical Impressions(s) / ED Diagnoses   Final diagnoses:  Peripheral edema  Nonintractable headache, unspecified chronicity pattern, unspecified headache type    New Prescriptions Discharge Medication List as of 03/10/2017 10:10 AM       Gareth Morgan, MD 03/11/17 [phone]

## 2017-03-12 ENCOUNTER — Ambulatory Visit (INDEPENDENT_AMBULATORY_CARE_PROVIDER_SITE_OTHER): Payer: BLUE CROSS/BLUE SHIELD | Admitting: Medical

## 2017-03-12 ENCOUNTER — Encounter: Payer: Self-pay | Admitting: Medical

## 2017-03-12 VITALS — BP 130/90 | HR 89 | Wt 248.8 lb

## 2017-03-12 DIAGNOSIS — T782XXD Anaphylactic shock, unspecified, subsequent encounter: Secondary | ICD-10-CM

## 2017-03-12 DIAGNOSIS — T783XXD Angioneurotic edema, subsequent encounter: Secondary | ICD-10-CM | POA: Diagnosis not present

## 2017-03-12 DIAGNOSIS — I10 Essential (primary) hypertension: Secondary | ICD-10-CM | POA: Diagnosis not present

## 2017-03-12 MED ORDER — NEBIVOLOL HCL 10 MG PO TABS: 10.0000 mg | ORAL_TABLET | Freq: Every day | ORAL | 2 refills | Status: DC

## 2017-03-12 MED ORDER — AMLODIPINE BESYLATE 10 MG PO TABS: 10.0000 mg | ORAL_TABLET | Freq: Every day | ORAL | 3 refills | Status: DC

## 2017-03-12 MED ORDER — HYDROCHLOROTHIAZIDE 12.5 MG PO TABS: 12.5000 mg | ORAL_TABLET | Freq: Every day | ORAL | 3 refills | Status: DC

## 2017-03-12 NOTE — Patient Instructions (Signed)
Recommendations:  STOP lisinopril  STOP lisinopril HCT  Finish out the Amlodipine 5mg , but take 2 daily to equal 10mg   When you finish the amlodipine 5mg , then new prescription will be Amlodipine 10mg , 1 tablet daily  BEGIN separate Hydrochlorothiazide 12.5mg  tablet.    BEGIN samples of Bystolic 5mg .  This is a new blood pressure medication for you.    After you take the daily  5mg  tablet for 2 weeks, then go to the 10mg  tablet daily.  Call or e-mail or recheck in 3-4 weeks to let me know you are tolerating these medication, that you have not had any more symptoms, and let me know what your blood pressure are running  Goal BP is 130/80

## 2017-03-12 NOTE — Progress Notes (Signed)
Subjective: Chief Complaint  Patient presents with  . Hospitalization Follow-up    er up,  from swelling in feet and legs, found out her potassium was low    Here for ED f/u.  She went to the ED 03/10/17 with lip swelling, tongue swelling, leg swelling, diagnosed with anaphylaxis.  Was given prednisone, benadryl, epi pen.   Was given 1 round of epipen in the ED.   She has not finished steroid yet.   Using benadryl once daily.   She thinks its was related to the potassium tablets.   She notes history reaction with rash on face with potassium tablets.  Of note, she notes Lisinopril wasn't discussed at the ED.   She has been on lisinopril for years, but there was apparently confusion from her 12/2016 physical visit.  She has actually been taking Lisinopril HCT and plain Lisinopril she had left over when we made changes 12/2016.  She did stop spironolactone from last visit.   Today feeling better. Legs still swollen.   But no more lip or tongue swelling.  No other aggravating or relieving factors. No other complaint.   Past Medical History:  Diagnosis Date  . Asthma    2015 hospitaliation for asthma  . Diabetes mellitus    vs impaired fasting glucose  . Family history of breast cancer   . Fibroids 2010  . Genital herpes   . GERD (gastroesophageal reflux disease)   . Hyperlipidemia   . Hypertension   . Impaired fasting blood sugar   . Migraines   . Obesity   . Smoker    Current Outpatient Prescriptions on File Prior to Visit  Medication Sig Dispense Refill  . albuterol (PROVENTIL HFA;VENTOLIN HFA) 108 (90 Base) MCG/ACT inhaler Inhale 1-2 puffs into the lungs every 6 (six) hours as needed for wheezing or shortness of breath. 1 Inhaler 0  . aspirin EC 81 MG tablet Take 1 tablet (81 mg total) by mouth daily. 90 tablet 3  . Fluticasone-Salmeterol (ADVAIR) 250-50 MCG/DOSE AEPB Inhale 1 puff into the lungs 2 (two) times daily. 60 each 11  . ibuprofen (ADVIL,MOTRIN) 600 MG tablet Take 1 tablet (600  mg total) by mouth every 6 (six) hours as needed (pain). 30 tablet 0  . loratadine (CLARITIN) 10 MG tablet Take 10 mg by mouth daily.    . metFORMIN (GLUCOPHAGE) 500 MG tablet TAKE ONE TABLET BY MOUTH ONCE DAILY WITH  BREAKFAST (Patient taking differently: Take 500 mg by mouth daily with breakfast. ) 90 tablet 3  . nicotine (NICODERM CQ - DOSED IN MG/24 HOURS) 21 mg/24hr patch Place 1 patch (21 mg total) onto the skin daily. 28 patch 0  . simvastatin (ZOCOR) 10 MG tablet Take 1 tablet (10 mg total) by mouth daily. 90 tablet 3  . Vitamin D, Ergocalciferol, (DRISDOL) 50000 units CAPS capsule Take 1 capsule (50,000 Units total) by mouth every 7 (seven) days. (Patient taking differently: Take 50,000 Units by mouth every Sunday. ) 12 capsule 3  . acyclovir (ZOVIRAX) 200 MG capsule Take 1 capsule (200 mg total) by mouth 2 (two) times daily. 180 capsule 3   No current facility-administered medications on file prior to visit.    ROS as in subjective   Objective: BP 130/90   Pulse 89   Wt 248 lb 12.8 oz (112.9 kg)   LMP 06/29/2015 (Exact Date)   SpO2 97%   BMI 34.70 kg/m   Wt Readings from Last 3 Encounters:  03/12/17 248 lb 12.8  oz (112.9 kg)  03/10/17 242 lb (109.8 kg)  01/08/17 249 lb 3.2 oz (113 kg)   BP Readings from Last 3 Encounters:  03/12/17 130/90  03/10/17 (!) 155/95  03/10/17 (!) 150/94   General appearance: alert, no distress, WD/WN,  HEENT: normocephalic, sclerae anicteric, TMs pearly, nares patent, no discharge or erythema, pharynx normal Oral cavity: MMM, no lesions Neck: supple, no lymphadenopathy, no thyromegaly, no masses Heart: RRR, normal S1, S2, no murmurs Lungs: CTA bilaterally, no wheezes, rhonchi, or rales Ext: no appreciable edema Pulses: 2+ symmetric, upper and lower extremities, normal cap refill   Assessment: Encounter Diagnoses  Name Primary?  . Angioedema, subsequent encounter Yes  . Anaphylaxis, subsequent encounter   . Essential hypertension      Plan: Reviewed 03/10/17 ED notes, labs.   Interestingly, lisinopril wasn't discontinued but may be the cause of her allergic reaction.  To be safe, we will stop lisinopril.   She will also avoid potassium for now since she thinks this was a possible cause of the reaction.   She doesn't suspect any recent foods or other triggers.  However advised if any recurrent angioedema going forward we would certainly refer to allergist.  She declines allergist referral today.   Her anaphylaxis symptoms and angioedema have resolved.   Finish prednisone, use benadryl another few days, and we will make changes to the medications below.  discussed risks/benefits of Bystolic which we will add. Gave samples and script.    answers her questions and discussed f/u.   Patient Instructions  Recommendations:  STOP lisinopril  STOP lisinopril HCT  Finish out the Amlodipine 5mg , but take 2 daily to equal 10mg   When you finish the amlodipine 5mg , then new prescription will be Amlodipine 10mg , 1 tablet daily  BEGIN separate Hydrochlorothiazide 12.5mg  tablet.    BEGIN samples of Bystolic 5mg .  This is a new blood pressure medication for you.    After you take the daily  5mg  tablet for 2 weeks, then go to the 10mg  tablet daily.  Call or e-mail or recheck in 3-4 weeks to let me know you are tolerating these medication, that you have not had any more symptoms, and let me know what your blood pressure are running  Goal BP is 130/80    Sydney Williams was seen today for hospitalization follow-up.  Diagnoses and all orders for this visit:  Angioedema, subsequent encounter  Anaphylaxis, subsequent encounter  Essential hypertension  Other orders -     amLODipine (NORVASC) 10 MG tablet; Take 1 tablet (10 mg total) by mouth daily. -     hydrochlorothiazide (HYDRODIURIL) 12.5 MG tablet; Take 1 tablet (12.5 mg total) by mouth daily. -     nebivolol (BYSTOLIC) 10 MG tablet; Take 1 tablet (10 mg total) by mouth  daily.

## 2017-03-13 ENCOUNTER — Telehealth: Payer: Self-pay | Admitting: Medical

## 2017-03-13 NOTE — Telephone Encounter (Signed)
Rcvd note from pharmacy asking due to pricing of Bystolic 10 mg #83, what other options ar available?

## 2017-03-13 NOTE — Telephone Encounter (Signed)
I'm deferring this to you

## 2017-03-17 ENCOUNTER — Other Ambulatory Visit: Payer: Self-pay | Admitting: Medical

## 2017-03-17 NOTE — Telephone Encounter (Signed)
Can  She have a refill

## 2017-03-17 NOTE — Telephone Encounter (Signed)
Mickel Baas please check on this.   I gave her a coupon card that shows pay as little as $35 per 90 day supply.   Get the drug rep to help.

## 2017-03-21 ENCOUNTER — Other Ambulatory Visit: Payer: Self-pay | Admitting: Medical

## 2017-03-21 MED ORDER — METOPROLOL TARTRATE 25 MG PO TABS: 25.0000 mg | ORAL_TABLET | Freq: Two times a day (BID) | ORAL | 2 refills | Status: DC

## 2017-03-21 NOTE — Telephone Encounter (Signed)
[facility] [facility] is going thru without P.A. But cost is $100 co pay, cash pay is $137.  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  [facility] and pt has to pay 100% co ins max $100 for ALL branded medications which in this case is $100 because the medication cost is over $100 up to the $6500 deductible.  All generics are only $10.  Can you switch to a generic medication?

## 2017-03-21 NOTE — Telephone Encounter (Signed)
I changed to generic metoprolol

## 2017-03-25 NOTE — Telephone Encounter (Signed)
Left message for pt

## 2017-03-25 NOTE — Telephone Encounter (Signed)
Pt returned my call & I explained change in medication & went over notes from last visit & medications because she was confused,  Told her not to take this with the Bystolic samples that she has to finish up samples then start new medication, not both of them, she has not even started samples so I told her not to even start them because that was just confusing her.

## 2017-04-21 ENCOUNTER — Emergency Department (HOSPITAL_COMMUNITY)
Admission: EM | Admit: 2017-04-21 | Discharge: 2017-04-21 | Disposition: A | Discharge status: Home / Self Care | Payer: BLUE CROSS/BLUE SHIELD | Attending: Emergency Medicine | Admitting: Emergency Medicine

## 2017-04-21 ENCOUNTER — Encounter (HOSPITAL_COMMUNITY): Payer: Self-pay | Admitting: *Deleted

## 2017-04-21 DIAGNOSIS — R609 Edema, unspecified: Secondary | ICD-10-CM

## 2017-04-21 DIAGNOSIS — L03115 Cellulitis of right lower limb: Secondary | ICD-10-CM | POA: Insufficient documentation

## 2017-04-21 DIAGNOSIS — M7989 Other specified soft tissue disorders: Secondary | ICD-10-CM | POA: Diagnosis present

## 2017-04-21 DIAGNOSIS — I10 Essential (primary) hypertension: Secondary | ICD-10-CM | POA: Diagnosis not present

## 2017-04-21 DIAGNOSIS — F17210 Nicotine dependence, cigarettes, uncomplicated: Secondary | ICD-10-CM | POA: Insufficient documentation

## 2017-04-21 DIAGNOSIS — Z7982 Long term (current) use of aspirin: Secondary | ICD-10-CM | POA: Insufficient documentation

## 2017-04-21 DIAGNOSIS — J45909 Unspecified asthma, uncomplicated: Secondary | ICD-10-CM | POA: Diagnosis not present

## 2017-04-21 DIAGNOSIS — E119 Type 2 diabetes mellitus without complications: Secondary | ICD-10-CM | POA: Diagnosis not present

## 2017-04-21 DIAGNOSIS — Z79899 Other long term (current) drug therapy: Secondary | ICD-10-CM | POA: Insufficient documentation

## 2017-04-21 MED ORDER — TRAMADOL HCL 50 MG PO TABS: 50.0000 mg | ORAL_TABLET | Freq: Four times a day (QID) | ORAL | 0 refills | Status: DC | PRN

## 2017-04-21 MED ORDER — HYDROCHLOROTHIAZIDE 25 MG PO TABS: 25.0000 mg | ORAL_TABLET | Freq: Every day | ORAL | 0 refills | Status: DC

## 2017-04-21 MED ORDER — FUROSEMIDE 40 MG PO TABS: 20.0000 mg | ORAL_TABLET | Freq: Once | ORAL | Status: DC

## 2017-04-21 MED ORDER — IBUPROFEN 800 MG PO TABS: 800.0000 mg | ORAL_TABLET | Freq: Once | ORAL | Status: DC

## 2017-04-21 MED ORDER — CEPHALEXIN 500 MG PO CAPS: 500.0000 mg | ORAL_CAPSULE | Freq: Four times a day (QID) | ORAL | 0 refills | Status: DC

## 2017-04-21 MED ORDER — CEPHALEXIN 500 MG PO CAPS: 500.0000 mg | ORAL_CAPSULE | Freq: Once | ORAL | Status: DC

## 2017-04-21 NOTE — Discharge Instructions (Signed)
Increase your hctz to 25 mg.  Take oral probiotic or yogurt while taking antibiotic.

## 2017-04-21 NOTE — ED Triage Notes (Signed)
Patient is alert and oriented x4.  She is complaining of bilateral leg swelling that started two days ago.  Patient states that she has had this issue before but not this bad.  Patient has no Hx of heart issues.  Patient adds that she notices that her right foot has some redness in it.  Currently she rates her pain 8 of 10.

## 2017-04-21 NOTE — ED Provider Notes (Signed)
[facility] DEPT Provider Note   CSN: [id number] Arrival date & time: 04/21/17  0736     History   Chief Complaint Chief Complaint  Patient presents with  . Leg Pain    HPI Sydney Williams is a 51 y.o. female.  Pt presents to the ED today with leg pain.  She said that she noticed some leg swelling yesterday and some redness to her right leg today.  The pt was in the ED about a month ago for angioedema and it was initially thought to be due to her oral potassium pill as that was a new med, however, she was also on lisinopril.  Her doctor took her off the lisinopril-hctz and put her on bystolic and hctz.  The pt said she's been compliant with her meds.  The pt denies any sob or cp.  She denies any fevers or chills.      Past Medical History:  Diagnosis Date  . Asthma    2015 hospitaliation for asthma  . Diabetes mellitus    vs impaired fasting glucose  . Family history of breast cancer   . Fibroids 2010  . Genital herpes   . GERD (gastroesophageal reflux disease)   . Hyperlipidemia   . Hypertension   . Impaired fasting blood sugar   . Migraines   . Obesity   . Smoker     Patient Active Problem List   Diagnosis Date Noted  . Vaccine counseling 01/08/2017  . 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 Screening for cervical cancer 01/08/2017  . Routine general medical examination at a health care facility 01/08/2017  . Snoring 09/11/2016  . History of migraine 09/11/2016  . Frequent headaches 09/11/2016  . Muscle cramp 09/11/2016  . Encounter for health maintenance examination in adult 12/04/2015  . Heel pain, bilateral 12/04/2015  . Screening for breast cancer 12/04/2015  . Need for influenza vaccination 12/04/2015  . Moderate persistent asthma 06/01/2015  . Gastroesophageal reflux disease without esophagitis 06/01/2015  . Rhinitis, allergic 06/01/2015  . Genital herpes 11/30/2014  . Smoker 11/30/2014  . Essential hypertension 11/30/2014  .  Hyperlipidemia 11/30/2014  . Impaired fasting blood sugar 11/30/2014  . Obesity 07/02/2012    Past Surgical History:  Procedure Laterality Date  . COLONOSCOPY  03/13/2016   Dr. Carlean Purl, normal, repeat 2027  . CYST EXCISION     left neck/postauricular region, benign  . LAPAROSCOPIC ABDOMINAL EXPLORATION     removal of ectopic preg  . TUBAL LIGATION      OB History    Gravida Para Term Preterm AB Living   5 1 1   2 3    SAB TAB Ectopic Multiple Live Births   1   1           Home Medications    Prior to Admission medications   Medication Sig Start Date End Date Taking? Authorizing Provider  acyclovir (ZOVIRAX) 200 MG capsule Take 1 capsule (200 mg total) by mouth 2 (two) times daily. 01/08/17   Tysinger, Camelia Eng, PA-C  albuterol (PROVENTIL HFA;VENTOLIN HFA) 108 (90 Base) MCG/ACT inhaler Inhale 1-2 puffs into the lungs every 6 (six) hours as needed for wheezing or shortness of breath. 01/08/17   Tysinger, Camelia Eng, PA-C  amLODipine (NORVASC) 10 MG tablet Take 1 tablet (10 mg total) by mouth daily. 03/12/17   Tysinger, Camelia Eng, PA-C  aspirin EC 81 MG tablet Take 1 tablet (81 mg total) by mouth daily. 01/08/17   Tysinger, Camelia Eng, PA-C  buPROPion (WELLBUTRIN XL) 150 MG 24 hr tablet TAKE ONE TABLET BY MOUTH ONCE DAILY 03/17/17   Tysinger, Camelia Eng, PA-C  cephALEXin (KEFLEX) 500 MG capsule Take 1 capsule (500 mg total) by mouth 4 (four) times daily. 04/21/17   Isla Pence, MD  Fluticasone-Salmeterol (ADVAIR) 250-50 MCG/DOSE AEPB Inhale 1 puff into the lungs 2 (two) times daily. 01/08/17   Tysinger, Camelia Eng, PA-C  hydrochlorothiazide (HYDRODIURIL) 25 MG tablet Take 1 tablet (25 mg total) by mouth daily. 04/21/17   Isla Pence, MD  ibuprofen (ADVIL,MOTRIN) 600 MG tablet Take 1 tablet (600 mg total) by mouth every 6 (six) hours as needed (pain). 10/25/16   Wojeck, Bernadene Bell, NP  loratadine (CLARITIN) 10 MG tablet Take 10 mg by mouth daily.    [provider]  metFORMIN (GLUCOPHAGE)  500 MG tablet TAKE ONE TABLET BY MOUTH ONCE DAILY WITH  BREAKFAST Patient taking differently: Take 500 mg by mouth daily with breakfast.  01/08/17   Tysinger, Camelia Eng, PA-C  metoprolol tartrate (LOPRESSOR) 25 MG tablet Take 1 tablet (25 mg total) by mouth 2 (two) times daily. 1 tablet po BID for blood pressure 03/21/17   Tysinger, Camelia Eng, PA-C  nicotine (NICODERM CQ - DOSED IN MG/24 HOURS) 21 mg/24hr patch Place 1 patch (21 mg total) onto the skin daily. 01/08/17   Tysinger, Camelia Eng, PA-C  simvastatin (ZOCOR) 10 MG tablet Take 1 tablet (10 mg total) by mouth daily. 01/08/17   Tysinger, Camelia Eng, PA-C  traMADol (ULTRAM) 50 MG tablet Take 1 tablet (50 mg total) by mouth every 6 (six) hours as needed. 04/21/17   Isla Pence, MD  Vitamin D, Ergocalciferol, (DRISDOL) 50000 units CAPS capsule Take 1 capsule (50,000 Units total) by mouth every 7 (seven) days. Patient taking differently: Take 50,000 Units by mouth every Sunday.  01/10/17   Tysinger, Camelia Eng, PA-C    Family History Family History  Problem Relation Age of Onset  . Diabetes Mother   . Hypertension Mother   . Aneurysm Mother   . Stroke Mother   . Cancer Mother        cervical cancer  . Cancer Maternal Aunt        breast  . Cancer Cousin        breast/breast  . Heart disease Neg Hx   . Colon cancer Neg Hx     Social History Social History  Substance Use Topics  . Smoking status: Current Every Day Smoker    Packs/day: 0.25    Years: 13.00    Types: Cigarettes  . Smokeless tobacco: Never Used  . Alcohol use Yes     Comment: occ     Allergies   Potassium chloride; Pneumococcal vaccines; and Vicodin [hydrocodone-acetaminophen]   Review of Systems Review of Systems  Cardiovascular: Positive for leg swelling.  Skin: Positive for rash.  All other systems reviewed and are negative.    Physical Exam Updated Vital Signs BP (!) 151/98   Pulse 83   Temp 97.7 F (36.5 C)   Resp 15   Ht 5\' 11"  (1.803 m)   Wt 111.6 kg  (246 lb)   LMP 06/29/2015 (Exact Date)   SpO2 97%   BMI 34.31 kg/m   Physical Exam  Constitutional: She is oriented to person, place, and time. She appears well-developed and well-nourished.  HENT:  Head: Normocephalic and atraumatic.  Right Ear: External ear normal.  Left Ear: External ear normal.  Nose: Nose normal.  Mouth/Throat: Oropharynx is  clear and moist.  Eyes: Conjunctivae and EOM are normal. Pupils are equal, round, and reactive to light.  Neck: Normal range of motion. Neck supple.  Cardiovascular: Normal rate, regular rhythm, normal heart sounds and intact distal pulses.   Pulmonary/Chest: Effort normal and breath sounds normal.  Abdominal: Soft. Bowel sounds are normal.  Musculoskeletal: She exhibits edema.  Neurological: She is alert and oriented to person, place, and time.  Skin: Skin is warm.     Psychiatric: She has a normal mood and affect. Her behavior is normal. Judgment and thought content normal.  Nursing note and vitals reviewed.    ED Treatments / Results  Labs (all labs ordered are listed, but only abnormal results are displayed) Labs Reviewed - No data to display  EKG  EKG Interpretation None       Radiology No results found.  Procedures Procedures (including critical care time)  Medications Ordered in ED Medications  cephALEXin (KEFLEX) capsule 500 mg (not administered)  furosemide (LASIX) tablet 20 mg (not administered)  ibuprofen (ADVIL,MOTRIN) tablet 800 mg (not administered)     Initial Impression / Assessment and Plan / ED Course  I have reviewed the triage vital signs and the nursing notes.  Pertinent labs & imaging results that were available during my care of the patient were reviewed by me and considered in my medical decision making (see chart for details).     Pt to increase her hctz to 25 mg.  She will be started on keflex and to return if worse.  F/u with pcp.  Final Clinical Impressions(s) / ED Diagnoses    Final diagnoses:  Peripheral edema  Cellulitis of right lower extremity    New Prescriptions New Prescriptions   CEPHALEXIN (KEFLEX) 500 MG CAPSULE    Take 1 capsule (500 mg total) by mouth 4 (four) times daily.   TRAMADOL (ULTRAM) 50 MG TABLET    Take 1 tablet (50 mg total) by mouth every 6 (six) hours as needed.     Isla Pence, MD 04/21/17 [phone]

## 2017-05-20 ENCOUNTER — Other Ambulatory Visit: Payer: Self-pay | Admitting: Medical

## 2017-05-20 ENCOUNTER — Telehealth: Payer: Self-pay | Admitting: Medical

## 2017-05-20 MED ORDER — HYDROCHLOROTHIAZIDE 25 MG PO TABS: 25.0000 mg | ORAL_TABLET | Freq: Every day | ORAL | 1 refills | Status: DC

## 2017-05-20 NOTE — Telephone Encounter (Signed)
Med sent and yes schedule ED f/u

## 2017-05-20 NOTE — Telephone Encounter (Signed)
Received rx refill from pharmacy to refill HCTZ 25 mg. This medication was originally given her by the ER. Per the notes from ER she was to follow up with [place] and that appt was never scheduled. Left message for pt to call to make an appt. Received form [facility] Pt can be reached at [phone].

## 2017-05-21 NOTE — Telephone Encounter (Signed)
Call and left another message for pt.

## 2017-06-19 ENCOUNTER — Emergency Department (HOSPITAL_COMMUNITY): Payer: BLUE CROSS/BLUE SHIELD

## 2017-06-19 ENCOUNTER — Other Ambulatory Visit: Payer: Self-pay

## 2017-06-19 ENCOUNTER — Other Ambulatory Visit (HOSPITAL_COMMUNITY): Payer: Self-pay

## 2017-06-19 ENCOUNTER — Inpatient Hospital Stay (HOSPITAL_COMMUNITY)
Admission: EM | Admit: 2017-06-19 | Discharge: 2017-06-23 | DRG: 287 | Disposition: A | Discharge status: Home / Self Care | Payer: BLUE CROSS/BLUE SHIELD | Attending: Internal Medicine | Admitting: Internal Medicine

## 2017-06-19 ENCOUNTER — Encounter (HOSPITAL_COMMUNITY): Payer: Self-pay | Admitting: Emergency Medicine

## 2017-06-19 DIAGNOSIS — Z887 Allergy status to serum and vaccine status: Secondary | ICD-10-CM

## 2017-06-19 DIAGNOSIS — Z7982 Long term (current) use of aspirin: Secondary | ICD-10-CM

## 2017-06-19 DIAGNOSIS — E785 Hyperlipidemia, unspecified: Secondary | ICD-10-CM | POA: Diagnosis present

## 2017-06-19 DIAGNOSIS — I119 Hypertensive heart disease without heart failure: Secondary | ICD-10-CM | POA: Diagnosis present

## 2017-06-19 DIAGNOSIS — R9439 Abnormal result of other cardiovascular function study: Secondary | ICD-10-CM

## 2017-06-19 DIAGNOSIS — G43909 Migraine, unspecified, not intractable, without status migrainosus: Secondary | ICD-10-CM | POA: Diagnosis present

## 2017-06-19 DIAGNOSIS — E669 Obesity, unspecified: Secondary | ICD-10-CM | POA: Diagnosis present

## 2017-06-19 DIAGNOSIS — I771 Stricture of artery: Secondary | ICD-10-CM | POA: Diagnosis present

## 2017-06-19 DIAGNOSIS — J45909 Unspecified asthma, uncomplicated: Secondary | ICD-10-CM | POA: Diagnosis present

## 2017-06-19 DIAGNOSIS — R0789 Other chest pain: Secondary | ICD-10-CM | POA: Diagnosis not present

## 2017-06-19 DIAGNOSIS — R079 Chest pain, unspecified: Secondary | ICD-10-CM | POA: Diagnosis not present

## 2017-06-19 DIAGNOSIS — F17200 Nicotine dependence, unspecified, uncomplicated: Secondary | ICD-10-CM | POA: Diagnosis not present

## 2017-06-19 DIAGNOSIS — E119 Type 2 diabetes mellitus without complications: Secondary | ICD-10-CM | POA: Diagnosis not present

## 2017-06-19 DIAGNOSIS — Z7984 Long term (current) use of oral hypoglycemic drugs: Secondary | ICD-10-CM

## 2017-06-19 DIAGNOSIS — F32A Depression, unspecified: Secondary | ICD-10-CM | POA: Diagnosis present

## 2017-06-19 DIAGNOSIS — Z7951 Long term (current) use of inhaled steroids: Secondary | ICD-10-CM

## 2017-06-19 DIAGNOSIS — Z885 Allergy status to narcotic agent status: Secondary | ICD-10-CM

## 2017-06-19 DIAGNOSIS — I200 Unstable angina: Principal | ICD-10-CM | POA: Diagnosis present

## 2017-06-19 DIAGNOSIS — F17210 Nicotine dependence, cigarettes, uncomplicated: Secondary | ICD-10-CM | POA: Diagnosis present

## 2017-06-19 DIAGNOSIS — A6000 Herpesviral infection of urogenital system, unspecified: Secondary | ICD-10-CM | POA: Diagnosis present

## 2017-06-19 DIAGNOSIS — J4520 Mild intermittent asthma, uncomplicated: Secondary | ICD-10-CM | POA: Diagnosis not present

## 2017-06-19 DIAGNOSIS — Z79899 Other long term (current) drug therapy: Secondary | ICD-10-CM

## 2017-06-19 DIAGNOSIS — F329 Major depressive disorder, single episode, unspecified: Secondary | ICD-10-CM | POA: Diagnosis present

## 2017-06-19 DIAGNOSIS — Z720 Tobacco use: Secondary | ICD-10-CM | POA: Diagnosis present

## 2017-06-19 DIAGNOSIS — I10 Essential (primary) hypertension: Secondary | ICD-10-CM | POA: Diagnosis present

## 2017-06-19 DIAGNOSIS — I447 Left bundle-branch block, unspecified: Secondary | ICD-10-CM | POA: Diagnosis present

## 2017-06-19 DIAGNOSIS — Z6835 Body mass index (BMI) 35.0-35.9, adult: Secondary | ICD-10-CM

## 2017-06-19 DIAGNOSIS — Z888 Allergy status to other drugs, medicaments and biological substances status: Secondary | ICD-10-CM

## 2017-06-19 DIAGNOSIS — I77810 Thoracic aortic ectasia: Secondary | ICD-10-CM | POA: Diagnosis present

## 2017-06-19 DIAGNOSIS — K219 Gastro-esophageal reflux disease without esophagitis: Secondary | ICD-10-CM | POA: Diagnosis not present

## 2017-06-19 DIAGNOSIS — E876 Hypokalemia: Secondary | ICD-10-CM | POA: Diagnosis present

## 2017-06-19 LAB — CBC
HCT: 41.6 % (ref 36.0–46.0)
Hemoglobin: 14.1 g/dL (ref 12.0–15.0)
MCH: 32.4 pg (ref 26.0–34.0)
MCHC: 33.9 g/dL (ref 30.0–36.0)
MCV: 95.6 fL (ref 78.0–100.0)
Platelets: 210 10*3/uL (ref 150–400)
RBC: 4.35 MIL/uL (ref 3.87–5.11)
RDW: 14.5 % (ref 11.5–15.5)
WBC: 5 10*3/uL (ref 4.0–10.5)

## 2017-06-19 LAB — BASIC METABOLIC PANEL
Anion gap: 10 (ref 5–15)
BUN: 15 mg/dL (ref 6–20)
CO2: 34 mmol/L — ABNORMAL HIGH (ref 22–32)
Calcium: 9.1 mg/dL (ref 8.9–10.3)
Chloride: 97 mmol/L — ABNORMAL LOW (ref 101–111)
Creatinine, Ser: 0.78 mg/dL (ref 0.44–1.00)
GFR calc Af Amer: >60 mL/min (ref >60)
GFR calc non Af Amer: >60 mL/min (ref >60)
Glucose, Bld: 88 mg/dL (ref 65–99)
Potassium: 3 mmol/L — ABNORMAL LOW (ref 3.5–5.1)
Sodium: 141 mmol/L (ref 135–145)

## 2017-06-19 LAB — TROPONIN I: Troponin I: <0.03 ng/mL (ref <0.03)

## 2017-06-19 LAB — I-STAT TROPONIN, ED: Troponin i, poc: 0 ng/mL (ref 0.00–0.08)

## 2017-06-19 LAB — HCG, QUANTITATIVE, PREGNANCY: hCG, Beta Chain, Quant, S: <1 m[IU]/mL (ref <5)

## 2017-06-19 LAB — D-DIMER, QUANTITATIVE: D-Dimer, Quant: 0.5 ug/mL-FEU (ref 0.00–0.50)

## 2017-06-19 LAB — GLUCOSE, CAPILLARY: Glucose-Capillary: 160 mg/dL — ABNORMAL HIGH (ref 65–99)

## 2017-06-19 LAB — MAGNESIUM: Magnesium: 1.6 mg/dL — ABNORMAL LOW (ref 1.7–2.4)

## 2017-06-19 MED ORDER — ONDANSETRON HCL 4 MG/2ML IJ SOLN: 4.0000 mg | Freq: Four times a day (QID) | INTRAMUSCULAR | Status: DC | PRN

## 2017-06-19 MED ORDER — AMLODIPINE BESYLATE 5 MG PO TABS
5.0000 mg | ORAL_TABLET | Freq: Every day | ORAL | Status: DC
  Administered 2017-06-20 – 2017-06-23 (×4): 5 mg via ORAL
  Filled 2017-06-19 (×4): qty 1

## 2017-06-19 MED ORDER — ASPIRIN 81 MG PO CHEW
324.0000 mg | CHEWABLE_TABLET | Freq: Once | ORAL | Status: AC
  Administered 2017-06-19: 324 mg via ORAL
  Filled 2017-06-19: qty 4

## 2017-06-19 MED ORDER — METOPROLOL TARTRATE 25 MG PO TABS
25.0000 mg | ORAL_TABLET | Freq: Two times a day (BID) | ORAL | Status: DC
  Administered 2017-06-19: 25 mg via ORAL
  Filled 2017-06-19: qty 1

## 2017-06-19 MED ORDER — ACYCLOVIR 200 MG PO CAPS
200.0000 mg | ORAL_CAPSULE | Freq: Two times a day (BID) | ORAL | Status: DC
  Administered 2017-06-19 – 2017-06-23 (×8): 200 mg via ORAL
  Filled 2017-06-19 (×10): qty 1

## 2017-06-19 MED ORDER — MORPHINE SULFATE (PF) 2 MG/ML IV SOLN
2.0000 mg | INTRAVENOUS | Status: DC | PRN
  Administered 2017-06-19 – 2017-06-23 (×4): 2 mg via INTRAVENOUS
  Filled 2017-06-19 (×4): qty 1

## 2017-06-19 MED ORDER — AMLODIPINE BESYLATE 5 MG PO TABS: 10.0000 mg | ORAL_TABLET | Freq: Every day | ORAL | Status: DC

## 2017-06-19 MED ORDER — HYDROCHLOROTHIAZIDE 25 MG PO TABS: 25.0000 mg | ORAL_TABLET | Freq: Every day | ORAL | Status: DC

## 2017-06-19 MED ORDER — INSULIN ASPART 100 UNIT/ML ~~LOC~~ SOLN
0.0000 [IU] | Freq: Three times a day (TID) | SUBCUTANEOUS | Status: DC
  Administered 2017-06-20: 2 [IU] via SUBCUTANEOUS
  Administered 2017-06-21: 1 [IU] via SUBCUTANEOUS
  Administered 2017-06-21: 2 [IU] via SUBCUTANEOUS
  Administered 2017-06-22 (×2): 1 [IU] via SUBCUTANEOUS

## 2017-06-19 MED ORDER — HYDROCHLOROTHIAZIDE 25 MG PO TABS
12.5000 mg | ORAL_TABLET | Freq: Every day | ORAL | Status: DC
  Administered 2017-06-20 – 2017-06-23 (×4): 12.5 mg via ORAL
  Filled 2017-06-19 (×4): qty 1

## 2017-06-19 MED ORDER — POTASSIUM CHLORIDE 10 MEQ/100ML IV SOLN
10.0000 meq | INTRAVENOUS | Status: AC
  Administered 2017-06-19 – 2017-06-20 (×2): 10 meq via INTRAVENOUS
  Filled 2017-06-19 (×5): qty 100

## 2017-06-19 MED ORDER — SIMVASTATIN 20 MG PO TABS
10.0000 mg | ORAL_TABLET | Freq: Every day | ORAL | Status: DC
  Administered 2017-06-20 – 2017-06-22 (×3): 10 mg via ORAL
  Filled 2017-06-19 (×4): qty 1

## 2017-06-19 MED ORDER — INSULIN ASPART 100 UNIT/ML ~~LOC~~ SOLN: 0.0000 [IU] | Freq: Every day | SUBCUTANEOUS | Status: DC

## 2017-06-19 MED ORDER — NITROGLYCERIN 0.4 MG SL SUBL: 0.4000 mg | SUBLINGUAL_TABLET | SUBLINGUAL | Status: DC | PRN

## 2017-06-19 MED ORDER — ZOLPIDEM TARTRATE 5 MG PO TABS
5.0000 mg | ORAL_TABLET | Freq: Every evening | ORAL | Status: DC | PRN
  Administered 2017-06-21 – 2017-06-22 (×2): 5 mg via ORAL
  Filled 2017-06-19 (×2): qty 1

## 2017-06-19 MED ORDER — NITROGLYCERIN 2 % TD OINT
1.0000 [in_us] | TOPICAL_OINTMENT | Freq: Once | TRANSDERMAL | Status: AC
  Administered 2017-06-19: 1 [in_us] via TOPICAL
  Filled 2017-06-19: qty 1

## 2017-06-19 MED ORDER — MAGNESIUM SULFATE 2 GM/50ML IV SOLN
2.0000 g | Freq: Once | INTRAVENOUS | Status: DC
  Filled 2017-06-19: qty 50

## 2017-06-19 MED ORDER — SODIUM CHLORIDE 0.9 % IV BOLUS (SEPSIS)
1000.0000 mL | Freq: Once | INTRAVENOUS | Status: AC
  Administered 2017-06-19: 1000 mL via INTRAVENOUS

## 2017-06-19 MED ORDER — ENOXAPARIN SODIUM 40 MG/0.4ML ~~LOC~~ SOLN
40.0000 mg | SUBCUTANEOUS | Status: DC
  Administered 2017-06-19 – 2017-06-22 (×4): 40 mg via SUBCUTANEOUS
  Filled 2017-06-19 (×4): qty 0.4

## 2017-06-19 MED ORDER — MOMETASONE FURO-FORMOTEROL FUM 200-5 MCG/ACT IN AERO
2.0000 | INHALATION_SPRAY | Freq: Two times a day (BID) | RESPIRATORY_TRACT | Status: DC
  Administered 2017-06-19 – 2017-06-23 (×8): 2 via RESPIRATORY_TRACT
  Filled 2017-06-19: qty 8.8

## 2017-06-19 MED ORDER — PANTOPRAZOLE SODIUM 40 MG PO TBEC: 40.0000 mg | DELAYED_RELEASE_TABLET | Freq: Every day | ORAL | Status: DC | PRN

## 2017-06-19 MED ORDER — ASPIRIN 81 MG PO CHEW
324.0000 mg | CHEWABLE_TABLET | Freq: Every day | ORAL | Status: DC
  Administered 2017-06-20 – 2017-06-23 (×4): 324 mg via ORAL
  Filled 2017-06-19 (×4): qty 4

## 2017-06-19 MED ORDER — NICOTINE 21 MG/24HR TD PT24
21.0000 mg | MEDICATED_PATCH | Freq: Every day | TRANSDERMAL | Status: DC
  Filled 2017-06-19 (×3): qty 1

## 2017-06-19 MED ORDER — ALBUTEROL SULFATE (2.5 MG/3ML) 0.083% IN NEBU: 2.5000 mg | INHALATION_SOLUTION | RESPIRATORY_TRACT | Status: DC | PRN

## 2017-06-19 MED ORDER — HYDRALAZINE HCL 20 MG/ML IJ SOLN
5.0000 mg | INTRAMUSCULAR | Status: DC | PRN
  Filled 2017-06-19: qty 0.25

## 2017-06-19 MED ORDER — TRAMADOL HCL 50 MG PO TABS
50.0000 mg | ORAL_TABLET | Freq: Four times a day (QID) | ORAL | Status: DC | PRN
  Administered 2017-06-20 – 2017-06-22 (×4): 50 mg via ORAL
  Filled 2017-06-19 (×4): qty 1

## 2017-06-19 MED ORDER — BUPROPION HCL ER (XL) 150 MG PO TB24
150.0000 mg | ORAL_TABLET | Freq: Every day | ORAL | Status: DC
  Administered 2017-06-20 – 2017-06-23 (×4): 150 mg via ORAL
  Filled 2017-06-19 (×4): qty 1

## 2017-06-19 NOTE — ED Notes (Signed)
Bed: EQ33 Expected date:  Expected time:  Means of arrival:  Comments: Laurance Flatten

## 2017-06-19 NOTE — ED Provider Notes (Signed)
[facility] DEPT Provider Note   CSN: [id number] Arrival date & time: 06/19/17  1233     History   Chief Complaint Chief Complaint  Patient presents with  . Chest Pain    HPI Sydney Williams is a 51 y.o. female.  The history is provided by the patient.  Chest Pain   This is a new problem. The current episode started 6 to 12 hours ago. Episode frequency: intermittent. The problem has not changed since onset.The pain is present in the lateral region (Left). The pain is moderate. The pain radiates to the left arm. Associated symptoms include lower extremity edema (chronic) and nausea. Pertinent negatives include no abdominal pain, no back pain, no cough, no fever, no hemoptysis, no leg pain, no shortness of breath and no vomiting. Risk factors include smoking/tobacco exposure.  Her past medical history is significant for diabetes, hyperlipidemia and hypertension.  Pertinent negatives for past medical history include no CAD, no cancer, no MI, no PE and no strokes.  Pertinent negatives for family medical history include: no early MI.  Procedure history is negative for cardiac catheterization and exercise treadmill test.   Also complains of left shoulder pain, worse with movement. Different than chest pain.  Past Medical History:  Diagnosis Date  . Asthma    2015 hospitaliation for asthma  . Diabetes mellitus    vs impaired fasting glucose  . Family history of breast cancer   . Fibroids 2010  . Genital herpes   . GERD (gastroesophageal reflux disease)   . Hyperlipidemia   . Hypertension   . Impaired fasting blood sugar   . Migraines   . Obesity   . Smoker     Patient Active Problem List   Diagnosis Date Noted  . Vaccine counseling 01/08/2017  . 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 Screening for cervical cancer 01/08/2017  . Routine general medical examination at a health care facility 01/08/2017  . Snoring 09/11/2016  . History of migraine 09/11/2016    . Frequent headaches 09/11/2016  . Muscle cramp 09/11/2016  . Encounter for health maintenance examination in adult 12/04/2015  . Heel pain, bilateral 12/04/2015  . Screening for breast cancer 12/04/2015  . Need for influenza vaccination 12/04/2015  . Moderate persistent asthma 06/01/2015  . Gastroesophageal reflux disease without esophagitis 06/01/2015  . Rhinitis, allergic 06/01/2015  . Genital herpes 11/30/2014  . Smoker 11/30/2014  . Essential hypertension 11/30/2014  . Hyperlipidemia 11/30/2014  . Impaired fasting blood sugar 11/30/2014  . Obesity 07/02/2012    Past Surgical History:  Procedure Laterality Date  . COLONOSCOPY  03/13/2016   Dr. Carlean Purl, normal, repeat 2027  . CYST EXCISION     left neck/postauricular region, benign  . LAPAROSCOPIC ABDOMINAL EXPLORATION     removal of ectopic preg  . TUBAL LIGATION      OB History    Gravida Para Term Preterm AB Living   5 1 1   2 3    SAB TAB Ectopic Multiple Live Births   1   1           Home Medications    Prior to Admission medications   Medication Sig Start Date End Date Taking? Authorizing Provider  acyclovir (ZOVIRAX) 200 MG capsule Take 1 capsule (200 mg total) by mouth 2 (two) times daily. 01/08/17   Tysinger, Camelia Eng, PA-C  albuterol (PROVENTIL HFA;VENTOLIN HFA) 108 (90 Base) MCG/ACT inhaler Inhale 1-2 puffs into the lungs every 6 (six) hours as needed for wheezing  or shortness of breath. 01/08/17   Tysinger, Camelia Eng, PA-C  amLODipine (NORVASC) 10 MG tablet Take 1 tablet (10 mg total) by mouth daily. 03/12/17   Tysinger, Camelia Eng, PA-C  aspirin EC 81 MG tablet Take 1 tablet (81 mg total) by mouth daily. 01/08/17   Tysinger, Camelia Eng, PA-C  buPROPion (WELLBUTRIN XL) 150 MG 24 hr tablet TAKE ONE TABLET BY MOUTH ONCE DAILY 03/17/17   Tysinger, Camelia Eng, PA-C  cephALEXin (KEFLEX) 500 MG capsule Take 1 capsule (500 mg total) by mouth 4 (four) times daily. 04/21/17   Isla Pence, MD  Fluticasone-Salmeterol (ADVAIR)  250-50 MCG/DOSE AEPB Inhale 1 puff into the lungs 2 (two) times daily. 01/08/17   Tysinger, Camelia Eng, PA-C  hydrochlorothiazide (HYDRODIURIL) 25 MG tablet Take 1 tablet (25 mg total) by mouth daily. 05/20/17   Tysinger, Camelia Eng, PA-C  ibuprofen (ADVIL,MOTRIN) 600 MG tablet Take 1 tablet (600 mg total) by mouth every 6 (six) hours as needed (pain). 10/25/16   Wojeck, Bernadene Bell, NP  loratadine (CLARITIN) 10 MG tablet Take 10 mg by mouth daily.    [provider]  metFORMIN (GLUCOPHAGE) 500 MG tablet TAKE ONE TABLET BY MOUTH ONCE DAILY WITH  BREAKFAST Patient taking differently: Take 500 mg by mouth daily with breakfast.  01/08/17   Tysinger, Camelia Eng, PA-C  metoprolol tartrate (LOPRESSOR) 25 MG tablet Take 1 tablet (25 mg total) by mouth 2 (two) times daily. 1 tablet po BID for blood pressure 03/21/17   Tysinger, Camelia Eng, PA-C  nicotine (NICODERM CQ - DOSED IN MG/24 HOURS) 21 mg/24hr patch Place 1 patch (21 mg total) onto the skin daily. 01/08/17   Tysinger, Camelia Eng, PA-C  simvastatin (ZOCOR) 10 MG tablet Take 1 tablet (10 mg total) by mouth daily. 01/08/17   Tysinger, Camelia Eng, PA-C  traMADol (ULTRAM) 50 MG tablet Take 1 tablet (50 mg total) by mouth every 6 (six) hours as needed. 04/21/17   Isla Pence, MD  Vitamin D, Ergocalciferol, (DRISDOL) 50000 units CAPS capsule Take 1 capsule (50,000 Units total) by mouth every 7 (seven) days. Patient taking differently: Take 50,000 Units by mouth every Sunday.  01/10/17   Tysinger, Camelia Eng, PA-C    Family History Family History  Problem Relation Age of Onset  . Diabetes Mother   . Hypertension Mother   . Aneurysm Mother   . Stroke Mother   . Cancer Mother        cervical cancer  . Cancer Maternal Aunt        breast  . Cancer Cousin        breast/breast  . Heart disease Neg Hx   . Colon cancer Neg Hx     Social History Social History  Substance Use Topics  . Smoking status: Current Every Day Smoker    Packs/day: 0.25    Years: 13.00     Types: Cigarettes  . Smokeless tobacco: Never Used  . Alcohol use Yes     Comment: occ     Allergies   Potassium chloride; Pneumococcal vaccines; and Vicodin [hydrocodone-acetaminophen]   Review of Systems Review of Systems  Constitutional: Negative for fever.  Respiratory: Negative for cough, hemoptysis and shortness of breath.   Cardiovascular: Positive for chest pain.  Gastrointestinal: Positive for nausea. Negative for abdominal pain and vomiting.  Musculoskeletal: Negative for back pain.  All other systems are reviewed and are negative for acute change except as noted in the HPI    Physical Exam Updated  Vital Signs BP (!) 154/97 (BP Location: Left Arm)   Pulse 86   Temp 97.9 F (36.6 C) (Oral)   Resp 18   LMP 06/29/2015 (Exact Date)   SpO2 96%   Physical Exam  Constitutional: She is oriented to person, place, and time. She appears well-developed and well-nourished. No distress.  HENT:  Head: Normocephalic and atraumatic.  Nose: Nose normal.  Eyes: Pupils are equal, round, and reactive to light. Conjunctivae and EOM are normal. Right eye exhibits no discharge. Left eye exhibits no discharge. No scleral icterus.  Neck: Normal range of motion. Neck supple.  Cardiovascular: Normal rate.  A regularly irregular rhythm present. Exam reveals no gallop and no friction rub.   No murmur heard. Pulmonary/Chest: Effort normal and breath sounds normal. No stridor. No respiratory distress. She has no rales.  Abdominal: Soft. She exhibits no distension. There is no tenderness.  Musculoskeletal: She exhibits no edema or tenderness.       Left shoulder: She exhibits pain (with anterior elevation). She exhibits normal range of motion and no tenderness.  Neurological: She is alert and oriented to person, place, and time.  Skin: Skin is warm and dry. No rash noted. She is not diaphoretic. No erythema.  Psychiatric: She has a normal mood and affect.  Vitals reviewed.    ED  Treatments / Results  Labs (all labs ordered are listed, but only abnormal results are displayed) Labs Reviewed  BASIC METABOLIC PANEL - Abnormal; Notable for the following:       Result Value   Potassium 3.0 (*)    Chloride 97 (*)    CO2 34 (*)    All other components within normal limits  CBC  I-STAT TROPONIN, ED    EKG  EKG Interpretation  Date/Time:  Thursday June 19 2017 12:47:05 EDT Ventricular Rate:  70 PR Interval:    QRS Duration: 104 QT Interval:  401 QTC Calculation: 433 R Axis:   -35 Text Interpretation:  Sinus rhythm Atrial premature complexes Left axis deviation Low voltage, precordial leads Probable anteroseptal infarct, old NO STEMI No significant change since last tracing Confirmed by Addison Lank [phone]) on 06/19/2017 5:53:40 PM       Radiology Dg Chest 2 View  Result Date: 06/19/2017 CLINICAL DATA:  Left side chest pain. EXAM: CHEST  2 VIEW COMPARISON:  12/04/2016 FINDINGS: Heart and mediastinal contours are within normal limits. No focal opacities or effusions. No acute bony abnormality. IMPRESSION: No active cardiopulmonary disease. Electronically Signed   By: Rolm Baptise M.D.   On: 06/19/2017 14:52    Procedures Procedures (including critical care time)  Medications Ordered in ED Medications  aspirin chewable tablet 324 mg (not administered)  nitroGLYCERIN (NITROGLYN) 2 % ointment 1 inch (not administered)  sodium chloride 0.9 % bolus 1,000 mL (not administered)     Initial Impression / Assessment and Plan / ED Course  I have reviewed the triage vital signs and the nursing notes.  Pertinent labs & imaging results that were available during my care of the patient were reviewed by me and considered in my medical decision making (see chart for details).     Intermittent atypical chest pain. EKG without acute ischemic changes or evidence of pericarditis. HEAR 5. Initial troponin negative. If other work up is negative patient will require  admission for ACS rule out  Chest x-ray without evidence suggestive of pneumonia, pneumothorax, pneumomediastinum.  No abnormal contour of the mediastinum to suggest dissection. No evidence of acute injuries.  Presentation not classic for aortic dissection or esophageal perforation. Low suspicion for pulmonary embolism.   Will discuss case with hospitalist for admission for ACS rule out.   Final Clinical Impressions(s) / ED Diagnoses   Final diagnoses:  Atypical chest pain      Jenkins Risdon, Grayce Sessions, MD 06/19/17 [phone]

## 2017-06-19 NOTE — Progress Notes (Signed)
Please call report to Darletta Moll at 2020. Phone # [phone]. Thanks.

## 2017-06-19 NOTE — ED Notes (Signed)
Pt had drawn in triage for labs: Gold Lavender Blue Lt green Dark green x2

## 2017-06-19 NOTE — H&P (Addendum)
History and Physical    Alexzia Kasler NWG:[id number] DOB: [birth year] DOA: 06/19/2017  Referring MD/NP/PA:   PCP: Carlena Hurl, PA-C   Patient coming from:  The patient is coming from home.  At baseline, pt is independent for most of ADL.        Chief Complaint: chest pain  HPI: Sydney Williams is a 51 y.o. female with medical history significant of hypertension, hyperlipidemia, diabetes mellitus, asthma, GERD, depression, tobacco abuse, obesity, migraine headache, who presents with chest pain.  Patient states that her chest pain started at noon. It is located in the left side of chest, intermittent, sharp, 8 out of 10 in severity, radiating to the left arm, causing left arm numbness. Her left arm numbness has resolved now. She also has dizziness. Denies shortness of breath, cough, fever or chills. Patient has nausea, but no vomiting, abdominal pain or diarrhea. Denies symptoms of UTI. Patient states that she traveled to and from [place] from 6/29 to 7/10 by driving. Patient states that she is allergic to potassium chloride pill, but not allergic to IV potassium chloride. She is allergic to Vicodin, but is okay to use IV morphine  ED Course: pt was found to have negative D-dimer, negative troponin, WBC 5.0, potassium 3.0, creatinine normal, temperature normal, no tachycardia, O2 sat 96% on room air, negative chest x-ray.  Review of Systems:   General: no fevers, chills, no body weight gain, has fatigue HEENT: no blurry vision, hearing changes or sore throat Respiratory: no dyspnea, coughing, wheezing CV: has chest pain, no palpitations GI: has nausea, no vomiting, abdominal pain, diarrhea, constipation GU: no dysuria, burning on urination, increased urinary frequency, hematuria  Ext: No leg edema Neuro: has left arm numbness. No vision change or hearing loss Skin: no rash, no skin tear. MSK: No muscle spasm, no deformity, no limitation of range of movement in spin Heme: No easy bruising.    Travel history: No recent long distant travel.  Allergy:  Allergies  Allergen Reactions  . Potassium Chloride Shortness Of Breath and Swelling    Tolerates IV KCl, reaction only to PO product  . Pneumococcal Vaccines Swelling and Other (See Comments)    Reaction:  Swelling at injection site  . Vicodin [Hydrocodone-Acetaminophen] Itching and Rash    Past Medical History:  Diagnosis Date  . Asthma    2015 hospitaliation for asthma  . Diabetes mellitus    vs impaired fasting glucose  . Family history of breast cancer   . Fibroids 2010  . Genital herpes   . GERD (gastroesophageal reflux disease)   . Hyperlipidemia   . Hypertension   . Impaired fasting blood sugar   . Migraines   . Obesity   . Smoker     Past Surgical History:  Procedure Laterality Date  . COLONOSCOPY  03/13/2016   Dr. Carlean Purl, normal, repeat 2027  . CYST EXCISION     left neck/postauricular region, benign  . LAPAROSCOPIC ABDOMINAL EXPLORATION     removal of ectopic preg  . TUBAL LIGATION      Social History:  reports that she has been smoking Cigarettes.  She has a 3.25 pack-year smoking history. She has never used smokeless tobacco. She reports that she drinks alcohol. She reports that she does not use drugs.  Family History:  Family History  Problem Relation Age of Onset  . Diabetes Mother   . Hypertension Mother   . Aneurysm Mother   . Stroke Mother   . Cancer Mother  cervical cancer  . Cancer Maternal Aunt        breast  . Cancer Cousin        breast/breast  . Heart disease Neg Hx   . Colon cancer Neg Hx      Prior to Admission medications   Medication Sig Start Date End Date Taking? Authorizing Provider  acyclovir (ZOVIRAX) 200 MG capsule Take 1 capsule (200 mg total) by mouth 2 (two) times daily. 01/08/17   Tysinger, Camelia Eng, PA-C  albuterol (PROVENTIL HFA;VENTOLIN HFA) 108 (90 Base) MCG/ACT inhaler Inhale 1-2 puffs into the lungs every 6 (six) hours as needed for wheezing or  shortness of breath. 01/08/17   Tysinger, Camelia Eng, PA-C  amLODipine (NORVASC) 10 MG tablet Take 1 tablet (10 mg total) by mouth daily. 03/12/17   Tysinger, Camelia Eng, PA-C  aspirin EC 81 MG tablet Take 1 tablet (81 mg total) by mouth daily. 01/08/17   Tysinger, Camelia Eng, PA-C  buPROPion (WELLBUTRIN XL) 150 MG 24 hr tablet TAKE ONE TABLET BY MOUTH ONCE DAILY 03/17/17   Tysinger, Camelia Eng, PA-C  cephALEXin (KEFLEX) 500 MG capsule Take 1 capsule (500 mg total) by mouth 4 (four) times daily. 04/21/17   Isla Pence, MD  Fluticasone-Salmeterol (ADVAIR) 250-50 MCG/DOSE AEPB Inhale 1 puff into the lungs 2 (two) times daily. 01/08/17   Tysinger, Camelia Eng, PA-C  hydrochlorothiazide (HYDRODIURIL) 25 MG tablet Take 1 tablet (25 mg total) by mouth daily. 05/20/17   Tysinger, Camelia Eng, PA-C  ibuprofen (ADVIL,MOTRIN) 600 MG tablet Take 1 tablet (600 mg total) by mouth every 6 (six) hours as needed (pain). 10/25/16   Wojeck, Bernadene Bell, NP  loratadine (CLARITIN) 10 MG tablet Take 10 mg by mouth daily.    [provider]  metFORMIN (GLUCOPHAGE) 500 MG tablet TAKE ONE TABLET BY MOUTH ONCE DAILY WITH  BREAKFAST Patient taking differently: Take 500 mg by mouth daily with breakfast.  01/08/17   Tysinger, Camelia Eng, PA-C  metoprolol tartrate (LOPRESSOR) 25 MG tablet Take 1 tablet (25 mg total) by mouth 2 (two) times daily. 1 tablet po BID for blood pressure 03/21/17   Tysinger, Camelia Eng, PA-C  nicotine (NICODERM CQ - DOSED IN MG/24 HOURS) 21 mg/24hr patch Place 1 patch (21 mg total) onto the skin daily. 01/08/17   Tysinger, Camelia Eng, PA-C  simvastatin (ZOCOR) 10 MG tablet Take 1 tablet (10 mg total) by mouth daily. 01/08/17   Tysinger, Camelia Eng, PA-C  traMADol (ULTRAM) 50 MG tablet Take 1 tablet (50 mg total) by mouth every 6 (six) hours as needed. 04/21/17   Isla Pence, MD  Vitamin D, Ergocalciferol, (DRISDOL) 50000 units CAPS capsule Take 1 capsule (50,000 Units total) by mouth every 7 (seven) days. Patient taking differently:  Take 50,000 Units by mouth every Sunday.  01/10/17   Carlena Hurl, PA-C    Physical Exam: Vitals:   06/19/17 1248 06/19/17 1625 06/19/17 1947 06/19/17 2122  BP: (!) 158/100 (!) 154/97 (!) 133/121 (!) 154/87  Pulse: 69 86 70 75  Resp: 18 18 (!) 21 16  Temp: 97.9 F (36.6 C)   (!) 97.4 F (36.3 C)  TempSrc: Oral   Oral  SpO2: 99% 96% 97% 98%  Weight:    113.4 kg (250 lb 1.6 oz)  Height:    5\' 11"  (1.803 m)   General: Not in acute distress HEENT:       Eyes: PERRL, EOMI, no scleral icterus.       ENT: No  discharge from the ears and nose, no pharynx injection, no tonsillar enlargement.        Neck: No JVD, no bruit, no mass felt. Heme: No neck lymph node enlargement. Cardiac: S1/S2, RRR, No murmurs, No gallops or rubs. Respiratory: No rales, wheezing, rhonchi or rubs. GI: Soft, nondistended, nontender, no rebound pain, no organomegaly, BS present. GU: No hematuria Ext: No pitting leg edema bilaterally. 2+DP/PT pulse bilaterally. Musculoskeletal: No joint deformities, No joint redness or warmth, no limitation of ROM in spin. Skin: No rashes.  Neuro: Alert, oriented X3, cranial nerves II-XII grossly intact, moves all extremities normally. Muscle strength 5/5 in all extremities, sensation to light touch intact. Brachial reflex 2+ bilaterally. Negative Babinski's sign. Normal finger to nose test. Psych: Patient is not psychotic, no suicidal or hemocidal ideation.  Labs on Admission: I have personally reviewed following labs and imaging studies  CBC:  Recent Labs Lab 06/19/17 1253  WBC 5.0  HGB 14.1  HCT 41.6  MCV 95.6  PLT 176   Basic Metabolic Panel:  Recent Labs Lab 06/19/17 1253 06/19/17 2014  NA 141  --   K 3.0*  --   CL 97*  --   CO2 34*  --   GLUCOSE 88  --   BUN 15  --   CREATININE 0.78  --   CALCIUM 9.1  --   MG  --  1.6*   GFR: Estimated Creatinine Clearance: 114 mL/min (by C-G formula based on SCr of 0.78 mg/dL). Liver Function Tests: No results  for input(s): AST, ALT, ALKPHOS, BILITOT, PROT, ALBUMIN in the last 168 hours. No results for input(s): LIPASE, AMYLASE in the last 168 hours. No results for input(s): AMMONIA in the last 168 hours. Coagulation Profile: No results for input(s): INR, PROTIME in the last 168 hours. Cardiac Enzymes:  Recent Labs Lab 06/19/17 2014  TROPONINI <0.03   BNP (last 3 results) No results for input(s): PROBNP in the last 8760 hours. HbA1C: No results for input(s): HGBA1C in the last 72 hours. CBG:  Recent Labs Lab 06/19/17 2130  GLUCAP 160*   Lipid Profile: No results for input(s): CHOL, HDL, LDLCALC, TRIG, CHOLHDL, LDLDIRECT in the last 72 hours. Thyroid Function Tests: No results for input(s): TSH, T4TOTAL, FREET4, T3FREE, THYROIDAB in the last 72 hours. Anemia Panel: No results for input(s): VITAMINB12, FOLATE, FERRITIN, TIBC, IRON, RETICCTPCT in the last 72 hours. Urine analysis:    Component Value Date/Time   COLORURINE YELLOW 05/04/2016 0622   APPEARANCEUR CLEAR 05/04/2016 0622   LABSPEC 1.010 05/04/2016 0622   PHURINE 6.5 05/04/2016 0622   GLUCOSEU NEGATIVE 05/04/2016 0622   HGBUR NEGATIVE 05/04/2016 0622   BILIRUBINUR neg 01/08/2017 1346   KETONESUR NEGATIVE 05/04/2016 0622   PROTEINUR neg 01/08/2017 1346   PROTEINUR NEGATIVE 05/04/2016 0622   UROBILINOGEN negative 01/08/2017 1346   UROBILINOGEN 0.2 07/24/2015 1841   NITRITE neg 01/08/2017 1346   NITRITE NEGATIVE 05/04/2016 0622   LEUKOCYTESUR Negative 01/08/2017 1346   Sepsis Labs: @LABRCNTIP (procalcitonin:4,lacticidven:4) )No results found for this or any previous visit (from the past 240 hour(s)).   Radiological Exams on Admission: Dg Chest 2 View  Result Date: 06/19/2017 CLINICAL DATA:  Left side chest pain. EXAM: CHEST  2 VIEW COMPARISON:  12/04/2016 FINDINGS: Heart and mediastinal contours are within normal limits. No focal opacities or effusions. No acute bony abnormality. IMPRESSION: No active  cardiopulmonary disease. Electronically Signed   By: Rolm Baptise M.D.   On: 06/19/2017 14:52     EKG:  Independently reviewed.  Sinus rhythm, QTC 452, LAD, anteroseptal infarction pattern, poor R-wave progression  Assessment/Plan Principal Problem:   Chest pain Active Problems:   Asthma   Diabetes mellitus without complication ([place])   Smoker   Essential hypertension   Hyperlipidemia   Gastroesophageal reflux disease without esophagitis   Depression   Hypokalemia   Hypomagnesemia   Chest pain: Patient has a significant risk factors, including hypertension, hyperlipidemia, smoking and diabetes mellitus, need to rule out ACS. Patient had recent long distant traveling, but her D-dimer is negative, less likely to have PE. no pneumonia by chest x-ray.   - will place on Tele bed for obs - cycle CE q6 x3 and repeat EKG in the am  - Nitroglycerin, Morphine, and aspirin, zocor - Risk factor stratification: will check FLP, UDS and A1C  - 2d echo - Inpatient non-urgent consult order was put in Epic and message to Birdie Sons was sent out.  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 Patient is taking metformin at home -SSI -f/u A1c  Asthma: stable -Dulera inhaler -prn albuterol nebulizer  HTN: -Continue amlodipine, metoprolol, HCTZ -IV hydralazine when necessary  HLD: -zocor  Tobacco abuse: -Did counseling about importance of quitting smoking -Nicotine patch  Gastroesophageal reflux disease without esophagitis -prn Protonix  Depression: Stable, no suicidal or homicidal ideations. -Continue home medications: Wellbutrin   hypokalemia and hypomagnesemia: K=3.0 and Mg=1.6 -repleted both.   DVT ppx: SQ Lovenox Code Status: Full code Family Communication:   Yes, patient's  husband at bed side Disposition Plan:  Anticipate discharge back to previous home environment Consults called:  none Admission status: Obs / tele        Date of Service  06/19/2017    Ivor Costa [facility] Pager [phone]  If 7PM-7AM, please contact night-coverage [email or web address] Password [facility] 06/19/2017, 9:57 PM

## 2017-06-19 NOTE — ED Triage Notes (Signed)
Pt states that she started having chest pain and dizziness today at work. Alert and oriented. No SOB.

## 2017-06-20 ENCOUNTER — Encounter (HOSPITAL_COMMUNITY): Payer: Self-pay | Admitting: Cardiology

## 2017-06-20 ENCOUNTER — Other Ambulatory Visit: Payer: Self-pay

## 2017-06-20 ENCOUNTER — Ambulatory Visit (HOSPITAL_COMMUNITY)
Admit: 2017-06-20 | Discharge: 2017-06-20 | Disposition: A | Discharge status: Home / Self Care | Payer: BLUE CROSS/BLUE SHIELD | Attending: Cardiology | Admitting: Cardiology

## 2017-06-20 ENCOUNTER — Observation Stay (HOSPITAL_COMMUNITY): Payer: Self-pay

## 2017-06-20 DIAGNOSIS — E7800 Pure hypercholesterolemia, unspecified: Secondary | ICD-10-CM

## 2017-06-20 DIAGNOSIS — E785 Hyperlipidemia, unspecified: Secondary | ICD-10-CM | POA: Insufficient documentation

## 2017-06-20 DIAGNOSIS — E876 Hypokalemia: Secondary | ICD-10-CM | POA: Diagnosis not present

## 2017-06-20 DIAGNOSIS — R079 Chest pain, unspecified: Secondary | ICD-10-CM

## 2017-06-20 DIAGNOSIS — E119 Type 2 diabetes mellitus without complications: Secondary | ICD-10-CM

## 2017-06-20 DIAGNOSIS — I10 Essential (primary) hypertension: Secondary | ICD-10-CM

## 2017-06-20 DIAGNOSIS — F17200 Nicotine dependence, unspecified, uncomplicated: Secondary | ICD-10-CM

## 2017-06-20 DIAGNOSIS — J4520 Mild intermittent asthma, uncomplicated: Secondary | ICD-10-CM | POA: Diagnosis not present

## 2017-06-20 LAB — RAPID URINE DRUG SCREEN, HOSP PERFORMED
Amphetamines: NOT DETECTED
Barbiturates: NOT DETECTED
Benzodiazepines: NOT DETECTED
Cocaine: NOT DETECTED
Opiates: POSITIVE — AB
Tetrahydrocannabinol: NOT DETECTED

## 2017-06-20 LAB — BASIC METABOLIC PANEL
Anion gap: 11 (ref 5–15)
BUN: 6 mg/dL (ref 6–20)
CO2: 31 mmol/L (ref 22–32)
Calcium: 9.2 mg/dL (ref 8.9–10.3)
Chloride: 98 mmol/L — ABNORMAL LOW (ref 101–111)
Creatinine, Ser: 0.51 mg/dL (ref 0.44–1.00)
GFR calc Af Amer: >60 mL/min (ref >60)
GFR calc non Af Amer: >60 mL/min (ref >60)
Glucose, Bld: 144 mg/dL — ABNORMAL HIGH (ref 65–99)
Potassium: 2.7 mmol/L — CL (ref 3.5–5.1)
Sodium: 140 mmol/L (ref 135–145)

## 2017-06-20 LAB — NM MYOCAR MULTI W/SPECT W/WALL MOTION / EF
Estimated workload: 1 METS
Exercise duration (min): 5 min
Exercise duration (sec): 0 s
Peak HR: 90 {beats}/min
Rest HR: 67 {beats}/min

## 2017-06-20 LAB — BRAIN NATRIURETIC PEPTIDE: B Natriuretic Peptide: 117.5 pg/mL — ABNORMAL HIGH (ref 0.0–100.0)

## 2017-06-20 LAB — LIPID PANEL
Cholesterol: 175 mg/dL (ref 0–200)
HDL: 51 mg/dL (ref >40)
LDL Cholesterol: 62 mg/dL (ref 0–99)
Total CHOL/HDL Ratio: 3.4 RATIO
Triglycerides: 312 mg/dL — ABNORMAL HIGH (ref <150)
VLDL: 62 mg/dL — ABNORMAL HIGH (ref 0–40)

## 2017-06-20 LAB — GLUCOSE, CAPILLARY
Glucose-Capillary: 86 mg/dL (ref 65–99)
Glucose-Capillary: 171 mg/dL — ABNORMAL HIGH (ref 65–99)
Glucose-Capillary: 139 mg/dL — ABNORMAL HIGH (ref 65–99)

## 2017-06-20 LAB — HEMOGLOBIN A1C
Hgb A1c MFr Bld: 6.3 % — ABNORMAL HIGH (ref 4.8–5.6)
Mean Plasma Glucose: 134.11 mg/dL

## 2017-06-20 LAB — MAGNESIUM: Magnesium: 1.6 mg/dL — ABNORMAL LOW (ref 1.7–2.4)

## 2017-06-20 LAB — TROPONIN I
Troponin I: <0.03 ng/mL (ref <0.03)
Troponin I: <0.03 ng/mL (ref <0.03)

## 2017-06-20 MED ORDER — METOPROLOL TARTRATE 25 MG PO TABS
25.0000 mg | ORAL_TABLET | Freq: Two times a day (BID) | ORAL | Status: DC
  Administered 2017-06-20 – 2017-06-23 (×7): 25 mg via ORAL
  Filled 2017-06-20 (×7): qty 1

## 2017-06-20 MED ORDER — SODIUM CHLORIDE 0.45 % IV SOLN
INTRAVENOUS | Status: DC
  Administered 2017-06-20 – 2017-06-21 (×3): via INTRAVENOUS
  Filled 2017-06-20 (×5): qty 1000

## 2017-06-20 MED ORDER — TECHNETIUM TC 99M TETROFOSMIN IV KIT
10.0000 | PACK | Freq: Once | INTRAVENOUS | Status: AC | PRN
  Administered 2017-06-20: 10 via INTRAVENOUS

## 2017-06-20 MED ORDER — TECHNETIUM TC 99M TETROFOSMIN IV KIT
30.0000 | PACK | Freq: Once | INTRAVENOUS | Status: AC | PRN
  Administered 2017-06-20: 30 via INTRAVENOUS

## 2017-06-20 MED ORDER — REGADENOSON 0.4 MG/5ML IV SOLN
INTRAVENOUS | Status: AC
  Administered 2017-06-20: 0.4 mg via INTRAVENOUS
  Filled 2017-06-20: qty 5

## 2017-06-20 MED ORDER — MAGNESIUM OXIDE 400 (241.3 MG) MG PO TABS
400.0000 mg | ORAL_TABLET | Freq: Once | ORAL | Status: AC
  Administered 2017-06-20: 400 mg via ORAL
  Filled 2017-06-20: qty 1

## 2017-06-20 MED ORDER — REGADENOSON 0.4 MG/5ML IV SOLN
0.4000 mg | Freq: Once | INTRAVENOUS | Status: AC
  Administered 2017-06-20: 0.4 mg via INTRAVENOUS

## 2017-06-20 MED ORDER — POTASSIUM CHLORIDE 10 MEQ/100ML IV SOLN
10.0000 meq | INTRAVENOUS | Status: AC
  Administered 2017-06-20 (×2): 10 meq via INTRAVENOUS
  Filled 2017-06-20 (×2): qty 100

## 2017-06-20 NOTE — Progress Notes (Addendum)
Reviewed Myoview results with Dr Radford Pax who recommends cath. I spoke with the pt over the phone and explained the findings and plan for cath Monday around 12 noon with Dr End. Order have been written.   The patient understands that risks included but are not limited to stroke (1 in 1000), death (1 in 72), kidney failure [usually temporary] (1 in 500), bleeding (1 in 200), allergic reaction [possibly serious] (1 in 200).  The patient understands and agrees to proceed.   Kerin Ransom PA-C 06/20/2017 4:42 PM

## 2017-06-20 NOTE — Consult Note (Signed)
Cardiology Consultation:   Patient ID: Sydney Williams; [id number]; 04-20-1966   Admit date: 06/19/2017 Date of Consult: 06/20/2017  Primary Care Provider: Carlena Hurl, PA-C Primary Cardiologist: New   Patient Profile:   Sydney Williams is a 51 y.o. female with a hx of HTN, DM, HLD, and smoker who is being seen today for the evaluation of chest pain at the request of Dr Teryl Lucy.  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 While at work yesterday she developed Lt upper chest pain described as an "ache". It was not positional. She thought it was pass but it got more intense and radiated down her Lt arm. She tells me it was associated with weakness and dizziness and nausea. An aid took her B/P and suggested she go to the ED ([facility]). In the ED she was given SL NTG without significant relief. Her symptoms have gradually eased. She has not had pain like this in the past.   Past Medical History:  Diagnosis Date  . Asthma    2015 hospitaliation for asthma  . Diabetes mellitus    vs impaired fasting glucose  . Family history of breast cancer   . Fibroids 2010  . Genital herpes   . GERD (gastroesophageal reflux disease)   . Hyperlipidemia   . Hypertension   . Impaired fasting blood sugar   . Migraines   . Obesity   . Smoker     Past Surgical History:  Procedure Laterality Date  . COLONOSCOPY  03/13/2016   Dr. Carlean Purl, normal, repeat 2027  . CYST EXCISION     left neck/postauricular region, benign  . LAPAROSCOPIC ABDOMINAL EXPLORATION     removal of ectopic preg  . TUBAL LIGATION       Home Medications:  Prior to Admission medications   Medication Sig Start Date End Date Taking? Authorizing Provider  acyclovir (ZOVIRAX) 200 MG capsule Take 1 capsule (200 mg total) by mouth 2 (two) times daily. 01/08/17  Yes Tysinger, Camelia Eng, PA-C  albuterol (PROVENTIL HFA;VENTOLIN HFA) 108 (90 Base) MCG/ACT inhaler Inhale 1-2 puffs into the lungs every  6 (six) hours as needed for wheezing or shortness of breath. 01/08/17  Yes Tysinger, Camelia Eng, PA-C  amLODipine (NORVASC) 5 MG tablet Take 5 mg by mouth daily.  05/27/17  Yes [provider]  aspirin EC 81 MG tablet Take 1 tablet (81 mg total) by mouth daily. 01/08/17  Yes Tysinger, Camelia Eng, PA-C  buPROPion (WELLBUTRIN XL) 150 MG 24 hr tablet TAKE ONE TABLET BY MOUTH ONCE DAILY 03/17/17  Yes Tysinger, Camelia Eng, PA-C  Fluticasone-Salmeterol (ADVAIR) 250-50 MCG/DOSE AEPB Inhale 1 puff into the lungs 2 (two) times daily. 01/08/17  Yes Tysinger, Camelia Eng, PA-C  hydrochlorothiazide (HYDRODIURIL) 12.5 MG tablet Take 12.5 mg by mouth daily.  03/25/17  Yes [provider]  ibuprofen (ADVIL,MOTRIN) 200 MG tablet Take 600 mg by mouth every 6 (six) hours as needed for moderate pain.   Yes [provider]  metFORMIN (GLUCOPHAGE) 500 MG tablet TAKE ONE TABLET BY MOUTH ONCE DAILY WITH  BREAKFAST Patient taking differently: Take 500 mg by mouth daily with breakfast.  01/08/17  Yes Tysinger, Camelia Eng, PA-C  metoprolol tartrate (LOPRESSOR) 25 MG tablet Take 1 tablet (25 mg total) by mouth 2 (two) times daily. 1 tablet po BID for blood pressure Patient taking differently: Take 50 mg by mouth daily. 1 tablet po BID for blood  pressure 03/21/17  Yes Tysinger, Camelia Eng, PA-C  predniSONE (DELTASONE) 50 MG tablet Take 50 mg by mouth See admin instructions. Takes 50mg  daily for five days prn for asthma attacks. 03/13/17  Yes [provider]  simvastatin (ZOCOR) 10 MG tablet Take 1 tablet (10 mg total) by mouth daily. 01/08/17  Yes Tysinger, Camelia Eng, PA-C  traMADol (ULTRAM) 50 MG tablet Take 1 tablet (50 mg total) by mouth every 6 (six) hours as needed. Patient taking differently: Take 50 mg by mouth every 6 (six) hours as needed for moderate pain.  04/21/17  Yes Isla Pence, MD  Vitamin D, Ergocalciferol, (DRISDOL) 50000 units CAPS capsule Take 1 capsule (50,000 Units total) by mouth every 7 (seven)  days. Patient taking differently: Take 50,000 Units by mouth every Sunday.  01/10/17  Yes Tysinger, Camelia Eng, PA-C  amLODipine (NORVASC) 10 MG tablet Take 1 tablet (10 mg total) by mouth daily. Patient not taking: Reported on 06/19/2017 03/12/17   Tysinger, Camelia Eng, PA-C  cephALEXin (KEFLEX) 500 MG capsule Take 1 capsule (500 mg total) by mouth 4 (four) times daily. Patient not taking: Reported on 06/19/2017 04/21/17   Isla Pence, MD  hydrochlorothiazide (HYDRODIURIL) 25 MG tablet Take 1 tablet (25 mg total) by mouth daily. Patient not taking: Reported on 06/19/2017 05/20/17   Tysinger, Camelia Eng, PA-C  ibuprofen (ADVIL,MOTRIN) 600 MG tablet Take 1 tablet (600 mg total) by mouth every 6 (six) hours as needed (pain). Patient not taking: Reported on 06/19/2017 10/25/16   Wojeck, Bernadene Bell, NP  nicotine (NICODERM CQ - DOSED IN MG/24 HOURS) 21 mg/24hr patch Place 1 patch (21 mg total) onto the skin daily. Patient not taking: Reported on 06/19/2017 01/08/17   Carlena Hurl, PA-C    Inpatient Medications: Scheduled Meds: . acyclovir  200 mg Oral BID  . amLODipine  5 mg Oral Daily  . aspirin  324 mg Oral Daily  . buPROPion  150 mg Oral Daily  . enoxaparin (LOVENOX) injection  40 mg Subcutaneous Q24H  . hydrochlorothiazide  12.5 mg Oral Daily  . insulin aspart  0-5 Units Subcutaneous QHS  . insulin aspart  0-9 Units Subcutaneous TID WC  . mometasone-formoterol  2 puff Inhalation BID  . nicotine  21 mg Transdermal Daily  . simvastatin  10 mg Oral q1800   Continuous Infusions:  PRN Meds: albuterol, hydrALAZINE, morphine injection, nitroGLYCERIN, ondansetron (ZOFRAN) IV, pantoprazole, traMADol, zolpidem  Allergies:    Allergies  Allergen Reactions  . Potassium Chloride Shortness Of Breath and Swelling    Tolerates IV KCl, reaction only to PO product  . Pneumococcal Vaccines Swelling and Other (See Comments)    Reaction:  Swelling at injection site  . Vicodin [Hydrocodone-Acetaminophen] Itching  and Rash    Social History:   Social History   Social History  . Marital status: Married    Spouse name: N/A  . Number of children: N/A  . Years of education: N/A   Occupational History  . Not on file.   Social History Main Topics  . Smoking status: Current Every Day Smoker    Packs/day: 0.25    Years: 13.00    Types: Cigarettes  . Smokeless tobacco: Never Used  . Alcohol use Yes     Comment: occ  . Drug use: No  . Sexual activity: Yes    Birth control/ protection: Surgical   Other Topics Concern  . Not on file   Social History Narrative   Married, and her granddaughter  lives with her, works as Quarry manager at [employer], exercise - activity at work, walking.  12/2016    Family History:    Family History  Problem Relation Age of Onset  . Diabetes Mother   . Hypertension Mother   . Aneurysm Mother   . Stroke Mother   . Cancer Mother        cervical cancer  . Cancer Maternal Aunt        breast  . Cancer Cousin        breast/breast  . Heart disease Neg Hx   . Colon cancer Neg Hx      ROS:  Please see the history of present illness.  ROS  All other ROS reviewed and negative.     Physical Exam/Data:   Vitals:   06/19/17 1947 06/19/17 2122 06/19/17 2243 06/20/17 0557  BP: (!) 133/121 (!) 154/87  (!) 158/89  Pulse: 70 75  75  Resp: (!) 21 16  16   Temp:  (!) 97.4 F (36.3 C)  97.9 F (36.6 C)  TempSrc:  Oral  Oral  SpO2: 97% 98% 92% 96%  Weight:  250 lb 1.6 oz (113.4 kg)    Height:  5\' 11"  (1.803 m)      Intake/Output Summary (Last 24 hours) at 06/20/17 0805 Last data filed at 06/20/17 0200  Gross per 24 hour  Intake               30 ml  Output                0 ml  Net               30 ml   Filed Weights   06/19/17 2122  Weight: 250 lb 1.6 oz (113.4 kg)   Body mass index is 34.88 kg/m.  General:  Obese female, well developed, in no acute distress HEENT: normal Lymph: no adenopathy Neck: no JVD Endocrine:  No thryomegaly Vascular: No carotid  bruits; FA pulses 2+ bilaterally without bruits  Cardiac:  normal S1, S2; RRR; no murmur  Lungs:  clear to auscultation bilaterally, no wheezing, rhonchi or rales  Abd: obese,  soft, nontender, no hepatomegaly  Ext: no edema Musculoskeletal:  No deformities, BUE and BLE strength normal and equal Skin: warm and dry  Neuro:  CNs 2-12 intact, no focal abnormalities noted Psych:  Normal affect   EKG:  The EKG was personally reviewed and demonstrates:  NSR, initial EKG showed septal Qs. F/U EKG read as incomplete LBBB Telemetry:  Telemetry was personally reviewed and demonstrates:  NSR  Laboratory Data:  Chemistry Recent Labs Lab 06/19/17 1253  NA 141  K 3.0*  CL 97*  CO2 34*  GLUCOSE 88  BUN 15  CREATININE 0.78  CALCIUM 9.1  GFRNONAA >60  GFRAA >60  ANIONGAP 10    No results for input(s): PROT, ALBUMIN, AST, ALT, ALKPHOS, BILITOT in the last 168 hours. Hematology Recent Labs Lab 06/19/17 1253  WBC 5.0  RBC 4.35  HGB 14.1  HCT 41.6  MCV 95.6  MCH 32.4  MCHC 33.9  RDW 14.5  PLT 210   Cardiac Enzymes Recent Labs Lab 06/19/17 2014 06/20/17 0149  TROPONINI <0.03 <0.03    Recent Labs Lab 06/19/17 1305  TROPIPOC 0.00    BNPNo results for input(s): BNP, PROBNP in the last 168 hours.  DDimer  Recent Labs Lab 06/19/17 2014  DDIMER 0.50    Radiology/Studies:  Dg Chest 2 View  Result  Date: 06/19/2017 CLINICAL DATA:  Left side chest pain. EXAM: CHEST  2 VIEW COMPARISON:  12/04/2016 FINDINGS: Heart and mediastinal contours are within normal limits. No focal opacities or effusions. No acute bony abnormality. IMPRESSION: No active cardiopulmonary disease. Electronically Signed   By: Rolm Baptise M.D.   On: 06/19/2017 14:52    Assessment and Plan:   Chest pain- Moderate risk for CAD with multiple risk factors including DM,HTN, HLD, and smoking  Abnomal EKG- NSST changes, incomplete LBBB  Type 2 NIDDM- BS 88 on admission  HTN- Norvasc, HCTZ, Lopressor  prior to adm  H/O asthma- On inhalers, steroids PRN  Dyslipidemia LDL 76 on Zocor 10 mg  Smoker- Less than 1/2 ppd  Obesity- wgt 250, BMI 35  Hypokalemia- IV K+ ordered, pt has intolerance (rash) with PO K+  Plan: Will arrange for [facility]. MD to see at [facility].   Consider optimizing medications for this pt with HTN/DM- (add ARB, change Lopressor to Coreg)- will defer to primary service.   Angelena Form, PA-C  06/20/2017 8:05 AM

## 2017-06-20 NOTE — Plan of Care (Signed)
Problem: Health Behavior/Discharge Planning: Goal: Ability to manage health-related needs will improve Outcome: Progressing .  Problem: Nutrition: Goal: Adequate nutrition will be maintained Outcome: Progressing NPO until stress test completed and resulted.

## 2017-06-20 NOTE — Progress Notes (Addendum)
CRITICAL VALUE ALERT  Critical Value:  K+  Date & Time Notied:  06/20/17 0321  Provider Notified: Dr. Lonny Prude  Orders Received/Actions taken: Order for BMP tomorrow, continuous IVF 1/2NS + 40K+ @ 43ml/hr, continue tele monitoring.

## 2017-06-20 NOTE — Progress Notes (Signed)
Nutrition Brief Note  Patient identified on the Malnutrition Screening Tool (MST) Report.pt reported 70 lb weight loss in the past 1.5 years but it appears that weigh has been fairly stable during that time frame.   Wt Readings from Last 15 Encounters:  06/19/17 250 lb 1.6 oz (113.4 kg)  04/21/17 246 lb (111.6 kg)  03/12/17 248 lb 12.8 oz (112.9 kg)  03/10/17 242 lb (109.8 kg)  01/08/17 249 lb 3.2 oz (113 kg)  12/23/16 248 lb 12.8 oz (112.9 kg)  12/04/16 262 lb (118.8 kg)  12/02/16 242 lb (109.8 kg)  10/25/16 242 lb (109.8 kg)  09/11/16 248 lb (112.5 kg)  04/01/16 251 lb (113.9 kg)  03/13/16 257 lb (116.6 kg)  02/26/16 257 lb 12.8 oz (116.9 kg)  01/05/16 260 lb (117.9 kg)  12/04/15 257 lb (116.6 kg)    Body mass index is 34.88 kg/m. Patient meets criteria for obesity based on current BMI. Skin WDL. Pt with hx of HTN, DM, GERD, hyperlipidemia, asthma, depression, tobacco abuse, obesity, and migraines. She was admitted for chest pain; started ~noon yesterday on L side of chest and radiated down L arm leading to L arm numbness. Associated symptoms of dizziness and nausea without vomiting or diarrhea.   Current diet order is NPO. Myoview at [facility] planned; pt to remain NPO until after testing is done.   Medications reviewed; 12.5 mg Hydrodiuril/day, sliding scale Novolog, 400 mg Mag-ox/day. Labs reviewed; Cbg: 86 mg/dL, K: 3 mmol/L, Mg: 1.6 mg/dL, Cl: 97 mmol/L. Last HgbA1c was 5.5% on 09/11/16.  No nutrition interventions warranted at this time. If nutrition issues arise, please consult RD.     Jarome Matin, MS, RD, LDN, [facility] Inpatient Clinical Dietitian Pager # [phone] After hours/weekend pager # [phone]

## 2017-06-20 NOTE — Progress Notes (Signed)
   Charna Busman presented for a nuclear stress test today.  No immediate complications.  Stress imaging is pending at this time.  Preliminary EKG findings may be listed in the chart, but the stress test result will not be finalized until perfusion imaging is complete.  Linus Mako, PA-C 06/20/2017, 1:51 PM

## 2017-06-20 NOTE — Progress Notes (Signed)
PROGRESS NOTE    Uriel Horkey  LKG:[id number] DOB: October 16, 1966 DOA: 06/19/2017 PCP: Carlena Hurl, PA-C   Brief Narrative: Sydney Williams is a 51 y.o. the most a history of hypertension, hyperlipidemia, diabetes, asthma, GERD, depression, obesity, migraines. She presented with chest pain and was admitted for chest pain rule out. Plan for Myoview today. Chest pain resolved.   Assessment & Plan:   Principal Problem:   Chest pain Active Problems:   Asthma   Diabetes mellitus without complication (HCC)   Smoker   Essential hypertension   Hyperlipidemia   Gastroesophageal reflux disease without esophagitis   Depression   Hypokalemia   Hypomagnesemia   Chest pain Concern for ACS. D-dimer negative. No pneumonia on chest x-ray. Troponin cycled and negative to date. Chest pain resolved this morning. -Cardiology recommendations: cath -continue nitro, morphine prn, aspirin, Zocor -echocardiogram pending  Diabetes mellitus, type 2 Last hemoglobin A1c of 5.5 in 2017. On metformin as an outpatient -hold metformin -SSI  Asthma -continue dulera and albuterol  Essential hypertension -continue amlodipine -continue metoprolol -continue hydrochlorothiazide  Hyperlipidemia -continue Zocor  Tobacco abuse Counseled on admission -nicotine patch  GERD -continue Protonix  Depression -continue Wellbutrin  Hypokalemia Hypomagnesemia Supplementation given. -recheck BMP and magnesium  DVT prophylaxis: Lovenox Code Status: Full code Family Communication: None at bedside Disposition Plan: Discharge pending cardiac workup   Consultants:   Cardioliogy  Procedures:   Myoview stress test  Antimicrobials:  None    Subjective: No chest pain this morning.  Objective: Vitals:   06/19/17 1947 06/19/17 2122 06/19/17 2243 06/20/17 0557  BP: (!) 133/121 (!) 154/87  (!) 158/89  Pulse: 70 75  75  Resp: (!) 21 16  16   Temp:  (!) 97.4 F (36.3 C)  97.9 F (36.6 C)    TempSrc:  Oral  Oral  SpO2: 97% 98% 92% 96%  Weight:  113.4 kg (250 lb 1.6 oz)    Height:  5\' 11"  (1.803 m)      Intake/Output Summary (Last 24 hours) at 06/20/17 1345 Last data filed at 06/20/17 0200  Gross per 24 hour  Intake               30 ml  Output                0 ml  Net               30 ml   Filed Weights   06/19/17 2122  Weight: 113.4 kg (250 lb 1.6 oz)    Examination:  General exam: Appears calm and comfortable Respiratory system: Respiratory effort normal. Gastrointestinal system: Abdomen is nondistended Central nervous system: Alert and oriented Extremities: No edema. Skin: No cyanosis. No rashes Psychiatry: Judgement and insight appear normal. Mood & affect appropriate.     Data Reviewed: I have personally reviewed following labs and imaging studies  CBC:  Recent Labs Lab 06/19/17 1253  WBC 5.0  HGB 14.1  HCT 41.6  MCV 95.6  PLT 664   Basic Metabolic Panel:  Recent Labs Lab 06/19/17 1253 06/19/17 2014  NA 141  --   K 3.0*  --   CL 97*  --   CO2 34*  --   GLUCOSE 88  --   BUN 15  --   CREATININE 0.78  --   CALCIUM 9.1  --   MG  --  1.6*   GFR: Estimated Creatinine Clearance: 114 mL/min (by C-G formula based on SCr of 0.78 mg/dL). Liver  Function Tests: No results for input(s): AST, ALT, ALKPHOS, BILITOT, PROT, ALBUMIN in the last 168 hours. No results for input(s): LIPASE, AMYLASE in the last 168 hours. No results for input(s): AMMONIA in the last 168 hours. Coagulation Profile: No results for input(s): INR, PROTIME in the last 168 hours. Cardiac Enzymes:  Recent Labs Lab 06/19/17 2014 06/20/17 0149  TROPONINI <0.03 <0.03   BNP (last 3 results) No results for input(s): PROBNP in the last 8760 hours. HbA1C: No results for input(s): HGBA1C in the last 72 hours. CBG:  Recent Labs Lab 06/19/17 2130 06/20/17 0812  GLUCAP 160* 86   Lipid Profile: No results for input(s): CHOL, HDL, LDLCALC, TRIG, CHOLHDL, LDLDIRECT in  the last 72 hours. Thyroid Function Tests: No results for input(s): TSH, T4TOTAL, FREET4, T3FREE, THYROIDAB in the last 72 hours. Anemia Panel: No results for input(s): VITAMINB12, FOLATE, FERRITIN, TIBC, IRON, RETICCTPCT in the last 72 hours. Sepsis Labs: No results for input(s): PROCALCITON, LATICACIDVEN in the last 168 hours.  No results found for this or any previous visit (from the past 240 hour(s)).       Radiology Studies: Dg Chest 2 View  Result Date: 06/19/2017 CLINICAL DATA:  Left side chest pain. EXAM: CHEST  2 VIEW COMPARISON:  12/04/2016 FINDINGS: Heart and mediastinal contours are within normal limits. No focal opacities or effusions. No acute bony abnormality. IMPRESSION: No active cardiopulmonary disease. Electronically Signed   By: Rolm Baptise M.D.   On: 06/19/2017 14:52        Scheduled Meds: . acyclovir  200 mg Oral BID  . amLODipine  5 mg Oral Daily  . aspirin  324 mg Oral Daily  . buPROPion  150 mg Oral Daily  . enoxaparin (LOVENOX) injection  40 mg Subcutaneous Q24H  . hydrochlorothiazide  12.5 mg Oral Daily  . insulin aspart  0-5 Units Subcutaneous QHS  . insulin aspart  0-9 Units Subcutaneous TID WC  . metoprolol tartrate  25 mg Oral BID  . mometasone-formoterol  2 puff Inhalation BID  . nicotine  21 mg Transdermal Daily  . simvastatin  10 mg Oral q1800   Continuous Infusions:   LOS: 0 days     Cordelia Poche, MD [facility] 06/20/2017, 1:45 PM Pager: [phone]  If 7PM-7AM, please contact night-coverage [email or web address] Password [email or web address] 06/20/2017, 1:45 PM

## 2017-06-21 ENCOUNTER — Observation Stay (HOSPITAL_BASED_OUTPATIENT_CLINIC_OR_DEPARTMENT_OTHER): Payer: BLUE CROSS/BLUE SHIELD

## 2017-06-21 DIAGNOSIS — I771 Stricture of artery: Secondary | ICD-10-CM | POA: Diagnosis present

## 2017-06-21 DIAGNOSIS — K219 Gastro-esophageal reflux disease without esophagitis: Secondary | ICD-10-CM | POA: Diagnosis present

## 2017-06-21 DIAGNOSIS — E119 Type 2 diabetes mellitus without complications: Secondary | ICD-10-CM | POA: Diagnosis present

## 2017-06-21 DIAGNOSIS — Z888 Allergy status to other drugs, medicaments and biological substances status: Secondary | ICD-10-CM | POA: Diagnosis not present

## 2017-06-21 DIAGNOSIS — Z6835 Body mass index (BMI) 35.0-35.9, adult: Secondary | ICD-10-CM | POA: Diagnosis not present

## 2017-06-21 DIAGNOSIS — Z7984 Long term (current) use of oral hypoglycemic drugs: Secondary | ICD-10-CM | POA: Diagnosis not present

## 2017-06-21 DIAGNOSIS — Z885 Allergy status to narcotic agent status: Secondary | ICD-10-CM | POA: Diagnosis not present

## 2017-06-21 DIAGNOSIS — I119 Hypertensive heart disease without heart failure: Secondary | ICD-10-CM | POA: Diagnosis present

## 2017-06-21 DIAGNOSIS — E785 Hyperlipidemia, unspecified: Secondary | ICD-10-CM | POA: Diagnosis present

## 2017-06-21 DIAGNOSIS — R079 Chest pain, unspecified: Secondary | ICD-10-CM | POA: Diagnosis not present

## 2017-06-21 DIAGNOSIS — E782 Mixed hyperlipidemia: Secondary | ICD-10-CM | POA: Diagnosis not present

## 2017-06-21 DIAGNOSIS — A6000 Herpesviral infection of urogenital system, unspecified: Secondary | ICD-10-CM | POA: Diagnosis present

## 2017-06-21 DIAGNOSIS — F329 Major depressive disorder, single episode, unspecified: Secondary | ICD-10-CM | POA: Diagnosis present

## 2017-06-21 DIAGNOSIS — I10 Essential (primary) hypertension: Secondary | ICD-10-CM | POA: Diagnosis not present

## 2017-06-21 DIAGNOSIS — Z7951 Long term (current) use of inhaled steroids: Secondary | ICD-10-CM | POA: Diagnosis not present

## 2017-06-21 DIAGNOSIS — J45909 Unspecified asthma, uncomplicated: Secondary | ICD-10-CM | POA: Diagnosis present

## 2017-06-21 DIAGNOSIS — R0789 Other chest pain: Secondary | ICD-10-CM | POA: Diagnosis present

## 2017-06-21 DIAGNOSIS — F17210 Nicotine dependence, cigarettes, uncomplicated: Secondary | ICD-10-CM | POA: Diagnosis present

## 2017-06-21 DIAGNOSIS — E669 Obesity, unspecified: Secondary | ICD-10-CM | POA: Diagnosis present

## 2017-06-21 DIAGNOSIS — E876 Hypokalemia: Secondary | ICD-10-CM | POA: Diagnosis present

## 2017-06-21 DIAGNOSIS — R072 Precordial pain: Secondary | ICD-10-CM | POA: Diagnosis not present

## 2017-06-21 DIAGNOSIS — I77810 Thoracic aortic ectasia: Secondary | ICD-10-CM | POA: Diagnosis present

## 2017-06-21 DIAGNOSIS — R9439 Abnormal result of other cardiovascular function study: Secondary | ICD-10-CM | POA: Diagnosis present

## 2017-06-21 DIAGNOSIS — Z79899 Other long term (current) drug therapy: Secondary | ICD-10-CM | POA: Diagnosis not present

## 2017-06-21 DIAGNOSIS — G43909 Migraine, unspecified, not intractable, without status migrainosus: Secondary | ICD-10-CM | POA: Diagnosis present

## 2017-06-21 DIAGNOSIS — I200 Unstable angina: Secondary | ICD-10-CM | POA: Diagnosis present

## 2017-06-21 DIAGNOSIS — Z887 Allergy status to serum and vaccine status: Secondary | ICD-10-CM | POA: Diagnosis not present

## 2017-06-21 DIAGNOSIS — I447 Left bundle-branch block, unspecified: Secondary | ICD-10-CM | POA: Diagnosis present

## 2017-06-21 LAB — HIV ANTIBODY (ROUTINE TESTING W REFLEX): HIV Screen 4th Generation wRfx: NONREACTIVE

## 2017-06-21 LAB — BASIC METABOLIC PANEL
Anion gap: 7 (ref 5–15)
BUN: 7 mg/dL (ref 6–20)
CO2: 33 mmol/L — ABNORMAL HIGH (ref 22–32)
Calcium: 8.7 mg/dL — ABNORMAL LOW (ref 8.9–10.3)
Chloride: 100 mmol/L — ABNORMAL LOW (ref 101–111)
Creatinine, Ser: 0.51 mg/dL (ref 0.44–1.00)
GFR calc Af Amer: >60 mL/min (ref >60)
GFR calc non Af Amer: >60 mL/min (ref >60)
Glucose, Bld: 110 mg/dL — ABNORMAL HIGH (ref 65–99)
Potassium: 3.1 mmol/L — ABNORMAL LOW (ref 3.5–5.1)
Sodium: 140 mmol/L (ref 135–145)

## 2017-06-21 LAB — ECHOCARDIOGRAM COMPLETE
Height: 71 in
Weight: 4001.6 oz

## 2017-06-21 LAB — GLUCOSE, CAPILLARY
Glucose-Capillary: 141 mg/dL — ABNORMAL HIGH (ref 65–99)
Glucose-Capillary: 195 mg/dL — ABNORMAL HIGH (ref 65–99)
Glucose-Capillary: 110 mg/dL — ABNORMAL HIGH (ref 65–99)
Glucose-Capillary: 125 mg/dL — ABNORMAL HIGH (ref 65–99)

## 2017-06-21 MED ORDER — MAGNESIUM SULFATE 2 GM/50ML IV SOLN
2.0000 g | Freq: Once | INTRAVENOUS | Status: AC
  Administered 2017-06-21: 2 g via INTRAVENOUS
  Filled 2017-06-21: qty 50

## 2017-06-21 NOTE — Progress Notes (Signed)
*  PRELIMINARY RESULTS* Echocardiogram 2D Echocardiogram has been performed.  Leavy Cella 06/21/2017, 9:18 AM

## 2017-06-21 NOTE — Progress Notes (Signed)
Progress Note  Patient Name: Zannah Melucci Date of Encounter: 06/21/2017  Primary Cardiologist:   New (Dr. Radford Pax)  Subjective   No chest pain .  No SOB   Inpatient Medications    Scheduled Meds: . acyclovir  200 mg Oral BID  . amLODipine  5 mg Oral Daily  . aspirin  324 mg Oral Daily  . buPROPion  150 mg Oral Daily  . enoxaparin (LOVENOX) injection  40 mg Subcutaneous Q24H  . hydrochlorothiazide  12.5 mg Oral Daily  . insulin aspart  0-5 Units Subcutaneous QHS  . insulin aspart  0-9 Units Subcutaneous TID WC  . metoprolol tartrate  25 mg Oral BID  . mometasone-formoterol  2 puff Inhalation BID  . nicotine  21 mg Transdermal Daily  . simvastatin  10 mg Oral q1800   Continuous Infusions: . sodium chloride 0.45 % with kcl 75 mL/hr at 06/21/17 0806   PRN Meds: albuterol, hydrALAZINE, morphine injection, nitroGLYCERIN, ondansetron (ZOFRAN) IV, pantoprazole, traMADol, zolpidem   Vital Signs    Vitals:   06/20/17 1615 06/20/17 1924 06/20/17 2053 06/21/17 0513  BP: (!) 138/94  (!) 150/89 (!) 148/91  Pulse: 77  72 71  Resp:   18 20  Temp: 98.4 F (36.9 C)  97.7 F (36.5 C) 97.8 F (36.6 C)  TempSrc: Oral  Oral Oral  SpO2: 98% 93% 96% 97%  Weight:      Height:        Intake/Output Summary (Last 24 hours) at 06/21/17 0830 Last data filed at 06/21/17 0600  Gross per 24 hour  Intake            747.5 ml  Output                0 ml  Net            747.5 ml   Filed Weights   06/19/17 2122  Weight: 250 lb 1.6 oz (113.4 kg)    Telemetry    NSR. - Personally Reviewed  ECG    NA - Personally Reviewed  Physical Exam   GEN: No acute distress.   Neck: No  JVD Cardiac: RRR, no murmurs, rubs, or gallops.  Respiratory: Clear  to auscultation bilaterally. GI: Soft, nontender, non-distended  MS: No  edema; No deformity. Neuro:  Nonfocal  Psych: Normal affect   Labs    Chemistry Recent Labs Lab 06/19/17 1253 06/20/17 1610 06/21/17 0426  NA 141 140 140  K  3.0* 2.7* 3.1*  CL 97* 98* 100*  CO2 34* 31 33*  GLUCOSE 88 144* 110*  BUN 15 6 7   CREATININE 0.78 0.51 0.51  CALCIUM 9.1 9.2 8.7*  GFRNONAA >60 >60 >60  GFRAA >60 >60 >60  ANIONGAP 10 11 7      Hematology Recent Labs Lab 06/19/17 1253  WBC 5.0  RBC 4.35  HGB 14.1  HCT 41.6  MCV 95.6  MCH 32.4  MCHC 33.9  RDW 14.5  PLT 210    Cardiac Enzymes Recent Labs Lab 06/19/17 2014 06/20/17 0149 06/20/17 1615  TROPONINI <0.03 <0.03 <0.03    Recent Labs Lab 06/19/17 1305  TROPIPOC 0.00     BNP Recent Labs Lab 06/20/17 1615  BNP 117.5*     DDimer  Recent Labs Lab 06/19/17 2014  DDIMER 0.50     Radiology    Dg Chest 2 View  Result Date: 06/19/2017 CLINICAL DATA:  Left side chest pain. EXAM: CHEST  2 VIEW COMPARISON:  12/04/2016 FINDINGS: Heart and mediastinal contours are within normal limits. No focal opacities or effusions. No acute bony abnormality. IMPRESSION: No active cardiopulmonary disease. Electronically Signed   By: Rolm Baptise M.D.   On: 06/19/2017 14:52   Nm Myocar Multi W/spect W/wall Motion / Ef  Result Date: 06/20/2017  There was no ST segment deviation noted during stress.  Findings consistent with ischemia.  This is an intermediate risk study.  The left ventricular ejection fraction is mildly decreased (45-54%).  Large area of anterior apical ischemia SDS 13 No infarct EF 54%    Cardiac Studies   Lexiscan Myoview.  03/20/17   There was no ST segment deviation noted during stress.  Findings consistent with ischemia.  This is an intermediate risk study.  The left ventricular ejection fraction is mildly decreased (45-54%).   Large area of anterior apical ischemia SDS 13 No infarct EF 54%   Patient Profile     51 y.o. female with a hx of HTN, DM, HLD, and smoker who is being seen today for the evaluation of chest pain at the request of Dr Teryl Lucy.  Assessment & Plan    CHEST PAIN:  Consistent with unstable angina.   Abnormal  stress test as above.  Plan cardiac cath on Monday.  She is scheduled for 1:30.  Discussed with the patient today.  Echo pending.  Signed, Minus Breeding, MD  06/21/2017, 8:30 AM

## 2017-06-21 NOTE — Progress Notes (Signed)
PROGRESS NOTE    Sydney Williams  GMW:[id number] DOB: 1966-01-30 DOA: 06/19/2017 PCP: Carlena Hurl, PA-C   Brief Narrative: Sydney Williams is a 51 y.o. the most a history of hypertension, hyperlipidemia, diabetes, asthma, GERD, depression, obesity, migraines. She presented with chest pain and was admitted for chest pain rule out. Plan for Myoview today. Chest pain resolved.   Assessment & Plan:   Principal Problem:   Chest pain Active Problems:   Asthma   Diabetes mellitus without complication (HCC)   Smoker   Essential hypertension   Hyperlipidemia   Gastroesophageal reflux disease without esophagitis   Depression   Hypokalemia   Hypomagnesemia   Abnormal myocardial perfusion study   Chest pain Concern for ACS. D-dimer negative. No pneumonia on chest x-ray. Troponin cycled and negative to date. Chest pain resolved -Cardiology recommendations: cath on 8/27 -continue nitro, morphine prn, aspirin, Zocor -echocardiogram pending  Diabetes mellitus, type 2 Last hemoglobin A1c of 5.5 in 2017. On metformin as an outpatient -hold metformin -SSI  Asthma -continue dulera and albuterol  Essential hypertension -continue amlodipine -continue metoprolol -continue hydrochlorothiazide  Hyperlipidemia -continue Zocor  Tobacco abuse Counseled on admission -nicotine patch prn  GERD -continue Protonix  Depression -continue Wellbutrin  Hypokalemia Hypomagnesemia Supplementation given. -continue potassium containing IV fluids -Magnesium supplementation  DVT prophylaxis: Lovenox Code Status: Full code Family Communication: Husband at bedside Disposition Plan: Discharge pending cardiac workup   Consultants:   Cardioliogy  Procedures:   Myoview stress test (06/20/17): intermediate risk, consistent with ischemia, EF of 45-54%  Antimicrobials:  None    Subjective: No chest pain.  Objective: Vitals:   06/20/17 1615 06/20/17 1924 06/20/17 2053 06/21/17 0513    BP: (!) 138/94  (!) 150/89 (!) 148/91  Pulse: 77  72 71  Resp:   18 20  Temp: 98.4 F (36.9 C)  97.7 F (36.5 C) 97.8 F (36.6 C)  TempSrc: Oral  Oral Oral  SpO2: 98% 93% 96% 97%  Weight:      Height:        Intake/Output Summary (Last 24 hours) at 06/21/17 0944 Last data filed at 06/21/17 0933  Gross per 24 hour  Intake            987.5 ml  Output                0 ml  Net            987.5 ml   Filed Weights   06/19/17 2122  Weight: 113.4 kg (250 lb 1.6 oz)    Examination:  General exam: Appears calm and comfortable Respiratory system: Clear to auscultation bilaterally. Unlabored work of breathing. No wheezing or rales. Cardiovascular system: Regular rate and rhythm. Normal S1 and S2. No heart murmurs present. No extra heart sounds Gastrointestinal system: Soft, non-tender, non-distended, no guarding, no rebound, no masses felt Central nervous system: Alert and oriented Extremities: No edema. Skin: No cyanosis. No rashes Psychiatry: Judgement and insight appear normal. Mood & affect appropriate.     Data Reviewed: I have personally reviewed following labs and imaging studies  CBC:  Recent Labs Lab 06/19/17 1253  WBC 5.0  HGB 14.1  HCT 41.6  MCV 95.6  PLT 440   Basic Metabolic Panel:  Recent Labs Lab 06/19/17 1253 06/19/17 2014 06/20/17 1610 06/21/17 0426  NA 141  --  140 140  K 3.0*  --  2.7* 3.1*  CL 97*  --  98* 100*  CO2 34*  --  31 33*  GLUCOSE 88  --  144* 110*  BUN 15  --  6 7  CREATININE 0.78  --  0.51 0.51  CALCIUM 9.1  --  9.2 8.7*  MG  --  1.6* 1.6*  --    GFR: Estimated Creatinine Clearance: 114 mL/min (by C-G formula based on SCr of 0.51 mg/dL). Liver Function Tests: No results for input(s): AST, ALT, ALKPHOS, BILITOT, PROT, ALBUMIN in the last 168 hours. No results for input(s): LIPASE, AMYLASE in the last 168 hours. No results for input(s): AMMONIA in the last 168 hours. Coagulation Profile: No results for input(s): INR,  PROTIME in the last 168 hours. Cardiac Enzymes:  Recent Labs Lab 06/19/17 2014 06/20/17 0149 06/20/17 1615  TROPONINI <0.03 <0.03 <0.03   BNP (last 3 results) No results for input(s): PROBNP in the last 8760 hours. HbA1C:  Recent Labs  06/20/17 1615  HGBA1C 6.3*   CBG:  Recent Labs Lab 06/19/17 2130 06/20/17 0812 06/20/17 1556 06/20/17 2055 06/21/17 0722  GLUCAP 160* 86 171* 139* 141*   Lipid Profile:  Recent Labs  06/20/17 1615  CHOL 175  HDL 51  LDLCALC 62  TRIG 312*  CHOLHDL 3.4   Thyroid Function Tests: No results for input(s): TSH, T4TOTAL, FREET4, T3FREE, THYROIDAB in the last 72 hours. Anemia Panel: No results for input(s): VITAMINB12, FOLATE, FERRITIN, TIBC, IRON, RETICCTPCT in the last 72 hours. Sepsis Labs: No results for input(s): PROCALCITON, LATICACIDVEN in the last 168 hours.  No results found for this or any previous visit (from the past 240 hour(s)).       Radiology Studies: Dg Chest 2 View  Result Date: 06/19/2017 CLINICAL DATA:  Left side chest pain. EXAM: CHEST  2 VIEW COMPARISON:  12/04/2016 FINDINGS: Heart and mediastinal contours are within normal limits. No focal opacities or effusions. No acute bony abnormality. IMPRESSION: No active cardiopulmonary disease. Electronically Signed   By: Rolm Baptise M.D.   On: 06/19/2017 14:52   Nm Myocar Multi W/spect W/wall Motion / Ef  Result Date: 06/20/2017  There was no ST segment deviation noted during stress.  Findings consistent with ischemia.  This is an intermediate risk study.  The left ventricular ejection fraction is mildly decreased (45-54%).  Large area of anterior apical ischemia SDS 13 No infarct EF 54%        Scheduled Meds: . acyclovir  200 mg Oral BID  . amLODipine  5 mg Oral Daily  . aspirin  324 mg Oral Daily  . buPROPion  150 mg Oral Daily  . enoxaparin (LOVENOX) injection  40 mg Subcutaneous Q24H  . hydrochlorothiazide  12.5 mg Oral Daily  . insulin aspart   0-5 Units Subcutaneous QHS  . insulin aspart  0-9 Units Subcutaneous TID WC  . metoprolol tartrate  25 mg Oral BID  . mometasone-formoterol  2 puff Inhalation BID  . nicotine  21 mg Transdermal Daily  . simvastatin  10 mg Oral q1800   Continuous Infusions: . sodium chloride 0.45 % with kcl 75 mL/hr at 06/21/17 0806     LOS: 0 days     Cordelia Poche, MD [facility] 06/21/2017, 9:44 AM Pager: [phone]  If 7PM-7AM, please contact night-coverage [email or web address] Password [facility] 06/21/2017, 9:44 AM

## 2017-06-22 DIAGNOSIS — R072 Precordial pain: Secondary | ICD-10-CM

## 2017-06-22 LAB — BASIC METABOLIC PANEL
Anion gap: 7 (ref 5–15)
BUN: 8 mg/dL (ref 6–20)
CO2: 31 mmol/L (ref 22–32)
Calcium: 9 mg/dL (ref 8.9–10.3)
Chloride: 102 mmol/L (ref 101–111)
Creatinine, Ser: 0.57 mg/dL (ref 0.44–1.00)
GFR calc Af Amer: >60 mL/min (ref >60)
GFR calc non Af Amer: >60 mL/min (ref >60)
Glucose, Bld: 131 mg/dL — ABNORMAL HIGH (ref 65–99)
Potassium: 3.8 mmol/L (ref 3.5–5.1)
Sodium: 140 mmol/L (ref 135–145)

## 2017-06-22 LAB — GLUCOSE, CAPILLARY
Glucose-Capillary: 97 mg/dL (ref 65–99)
Glucose-Capillary: 125 mg/dL — ABNORMAL HIGH (ref 65–99)
Glucose-Capillary: 144 mg/dL — ABNORMAL HIGH (ref 65–99)
Glucose-Capillary: 132 mg/dL — ABNORMAL HIGH (ref 65–99)

## 2017-06-22 LAB — MAGNESIUM: Magnesium: 1.7 mg/dL (ref 1.7–2.4)

## 2017-06-22 NOTE — Progress Notes (Signed)
Progress Note  Patient Name: Sydney Williams Date of Encounter: 06/22/2017  Primary Cardiologist:   New (Dr. Radford Pax)  Subjective   No chest pain.  No SOB.   Inpatient Medications    Scheduled Meds: . acyclovir  200 mg Oral BID  . amLODipine  5 mg Oral Daily  . aspirin  324 mg Oral Daily  . buPROPion  150 mg Oral Daily  . enoxaparin (LOVENOX) injection  40 mg Subcutaneous Q24H  . hydrochlorothiazide  12.5 mg Oral Daily  . insulin aspart  0-5 Units Subcutaneous QHS  . insulin aspart  0-9 Units Subcutaneous TID WC  . metoprolol tartrate  25 mg Oral BID  . mometasone-formoterol  2 puff Inhalation BID  . nicotine  21 mg Transdermal Daily  . simvastatin  10 mg Oral q1800   Continuous Infusions: . sodium chloride 0.45 % with kcl 75 mL/hr at 06/21/17 2256   PRN Meds: albuterol, hydrALAZINE, morphine injection, nitroGLYCERIN, ondansetron (ZOFRAN) IV, pantoprazole, traMADol, zolpidem   Vital Signs    Vitals:   06/21/17 2044 06/21/17 2101 06/22/17 0538 06/22/17 0800  BP:  (!) 154/91 (!) 149/97   Pulse: 70 67 71   Resp: 18 20 20    Temp:  98.1 F (36.7 C) 98.9 F (37.2 C)   TempSrc:  Oral Oral   SpO2: 95% 95% 97% 96%  Weight:      Height:        Intake/Output Summary (Last 24 hours) at 06/22/17 0855 Last data filed at 06/22/17 0550  Gross per 24 hour  Intake           4767.5 ml  Output             3201 ml  Net           1566.5 ml   Filed Weights   06/19/17 2122  Weight: 250 lb 1.6 oz (113.4 kg)    Telemetry    NSR. - Personally Reviewed  ECG    NA - Personally Reviewed  Physical Exam   GEN: No  acute distress.   Neck: No  JVD Cardiac: RRR, no murmurs, rubs, or gallops.  Respiratory: Clear   to auscultation bilaterally. GI: Soft, nontender, non-distended, normal bowel sounds  MS:  No edema; No deformity. Neuro:   Nonfocal  Psych: Oriented and appropriate    Labs    Chemistry  Recent Labs Lab 06/19/17 1253 06/20/17 1610 06/21/17 0426  NA 141  140 140  K 3.0* 2.7* 3.1*  CL 97* 98* 100*  CO2 34* 31 33*  GLUCOSE 88 144* 110*  BUN 15 6 7   CREATININE 0.78 0.51 0.51  CALCIUM 9.1 9.2 8.7*  GFRNONAA >60 >60 >60  GFRAA >60 >60 >60  ANIONGAP 10 11 7      Hematology  Recent Labs Lab 06/19/17 1253  WBC 5.0  RBC 4.35  HGB 14.1  HCT 41.6  MCV 95.6  MCH 32.4  MCHC 33.9  RDW 14.5  PLT 210    Cardiac Enzymes  Recent Labs Lab 06/19/17 2014 06/20/17 0149 06/20/17 1615  TROPONINI <0.03 <0.03 <0.03     Recent Labs Lab 06/19/17 1305  TROPIPOC 0.00     BNP  Recent Labs Lab 06/20/17 1615  BNP 117.5*     DDimer   Recent Labs Lab 06/19/17 2014  DDIMER 0.50     Radiology    Nm Myocar Multi W/spect W/wall Motion / Ef  Result Date: 06/20/2017  There was no ST segment  deviation noted during stress.  Findings consistent with ischemia.  This is an intermediate risk study.  The left ventricular ejection fraction is mildly decreased (45-54%).  Large area of anterior apical ischemia SDS 13 No infarct EF 54%    Cardiac Studies   Lexiscan Myoview.  03/20/17   There was no ST segment deviation noted during stress.  Findings consistent with ischemia.  This is an intermediate risk study.  The left ventricular ejection fraction is mildly decreased (45-54%).   Large area of anterior apical ischemia SDS 13 No infarct EF 54%  ECHO:  06/21/17  Study Conclusions  - Left ventricle: The cavity size was normal. There was mild   concentric hypertrophy. Systolic function was normal. The   estimated ejection fraction was in the range of 60% to 65%. Wall   motion was normal; there were no regional wall motion   abnormalities. Features are consistent with a pseudonormal left   ventricular filling pattern, with concomitant abnormal relaxation   and increased filling pressure (grade 2 diastolic dysfunction).   Doppler parameters are consistent with high ventricular filling   pressure. - Aortic valve: Trileaflet;  mildly thickened, mildly calcified   leaflets. - Aorta: Ascending aorta diameter: 40 mm (ED). - Ascending aorta: The ascending aorta was mildly dilated. - Mitral valve: There was trivial regurgitation. - Left atrium: The atrium was mildly dilated.  Patient Profile     51 y.o. female with a hx of HTN, DM, HLD, and smoker who is being seen for the evaluation of chest pain at the request of Dr Teryl Lucy.  Assessment & Plan    CHEST PAIN:  Consistent with unstable angina.   Abnormal stress test.   Cath on Monday.  She is on for 1:30  MILDLY DILATED AORTIC ROOT:  Follow over time with repeat imaging.  Signed, Minus Breeding, MD  06/22/2017, 8:55 AM

## 2017-06-22 NOTE — Progress Notes (Signed)
PROGRESS NOTE    Sydney Williams  KGM:[id number] DOB: [birth year] DOA: 06/19/2017 PCP: Carlena Hurl, PA-C   Brief Narrative: Sydney Williams is a 51 y.o. the most a history of hypertension, hyperlipidemia, diabetes, asthma, GERD, depression, obesity, migraines. She presented with chest pain and was admitted for chest pain rule out. Plan for Myoview today. Chest pain resolved.   Assessment & Plan:   Principal Problem:   Chest pain Active Problems:   Asthma   Diabetes mellitus without complication (HCC)   Smoker   Essential hypertension   Hyperlipidemia   Gastroesophageal reflux disease without esophagitis   Depression   Hypokalemia   Hypomagnesemia   Abnormal myocardial perfusion study   Chest pain Concern for ACS. D-dimer negative. No pneumonia on chest x-ray. Troponin cycled and negative to date. Chest pain resolved. Echo significant for grade 2 diastolic dysfunction. Stress test significant for ischemia. -Cardiology recommendations: cath on 8/27 -continue nitro, morphine prn, aspirin, Zocor  Diabetes mellitus, type 2 Last hemoglobin A1c of 5.5 in 2017. On metformin as an outpatient -hold metformin -SSI  Asthma -continue dulera and albuterol  Essential hypertension -continue amlodipine -continue metoprolol -continue hydrochlorothiazide  Hyperlipidemia -continue Zocor  Tobacco abuse Counseled on admission -nicotine patch prn  GERD -continue Protonix  Depression -continue Wellbutrin  Hypokalemia Hypomagnesemia -continue potassium containing IV fluids -Magnesium supplementation -BMP pending  Diastolic heart dysfunction EF of 60-65%. Grade 2 diastolic dysfunction.  DVT prophylaxis: Lovenox Code Status: Full code Family Communication: Husband at bedside Disposition Plan: Discharge pending cardiac workup   Consultants:   Cardioliogy  Procedures:   Myoview stress test (06/20/17): intermediate risk, consistent with ischemia, EF of  45-54%  Antimicrobials:  None    Subjective: No chest pain.  Objective: Vitals:   06/21/17 2101 06/22/17 0538 06/22/17 0800 06/22/17 0924  BP: (!) 154/91 (!) 149/97  (!) 151/90  Pulse: 67 71  64  Resp: 20 20    Temp: 98.1 F (36.7 C) 98.9 F (37.2 C)    TempSrc: Oral Oral    SpO2: 95% 97% 96%   Weight:      Height:        Intake/Output Summary (Last 24 hours) at 06/22/17 1041 Last data filed at 06/22/17 0948  Gross per 24 hour  Intake           4887.5 ml  Output             3201 ml  Net           1686.5 ml   Filed Weights   06/19/17 2122  Weight: 113.4 kg (250 lb 1.6 oz)    Examination:  General exam: Appears calm and comfortable Respiratory system: Clear to auscultation bilaterally. Unlabored work of breathing. No wheezing or rales. Cardiovascular system: Regular rate and rhythm. Normal S1 and S2. No heart murmurs present. No extra heart sounds Gastrointestinal system: Soft, non-tender, non-distended, no guarding, no rebound, no masses felt Central nervous system: Alert and oriented Extremities: No edema. Skin: No cyanosis. No rashes Psychiatry: Judgement and insight appear normal. Mood & affect appropriate.     Data Reviewed: I have personally reviewed following labs and imaging studies  CBC:  Recent Labs Lab 06/19/17 1253  WBC 5.0  HGB 14.1  HCT 41.6  MCV 95.6  PLT 644   Basic Metabolic Panel:  Recent Labs Lab 06/19/17 1253 06/19/17 2014 06/20/17 1610 06/21/17 0426  NA 141  --  140 140  K 3.0*  --  2.7* 3.1*  CL  97*  --  98* 100*  CO2 34*  --  31 33*  GLUCOSE 88  --  144* 110*  BUN 15  --  6 7  CREATININE 0.78  --  0.51 0.51  CALCIUM 9.1  --  9.2 8.7*  MG  --  1.6* 1.6*  --    GFR: Estimated Creatinine Clearance: 114 mL/min (by C-G formula based on SCr of 0.51 mg/dL). Liver Function Tests: No results for input(s): AST, ALT, ALKPHOS, BILITOT, PROT, ALBUMIN in the last 168 hours. No results for input(s): LIPASE, AMYLASE in the last  168 hours. No results for input(s): AMMONIA in the last 168 hours. Coagulation Profile: No results for input(s): INR, PROTIME in the last 168 hours. Cardiac Enzymes:  Recent Labs Lab 06/19/17 2014 06/20/17 0149 06/20/17 1615  TROPONINI <0.03 <0.03 <0.03   BNP (last 3 results) No results for input(s): PROBNP in the last 8760 hours. HbA1C:  Recent Labs  06/20/17 1615  HGBA1C 6.3*   CBG:  Recent Labs Lab 06/21/17 0722 06/21/17 1141 06/21/17 1633 06/21/17 2055 06/22/17 0721  GLUCAP 141* 195* 110* 125* 97   Lipid Profile:  Recent Labs  06/20/17 1615  CHOL 175  HDL 51  LDLCALC 62  TRIG 312*  CHOLHDL 3.4   Thyroid Function Tests: No results for input(s): TSH, T4TOTAL, FREET4, T3FREE, THYROIDAB in the last 72 hours. Anemia Panel: No results for input(s): VITAMINB12, FOLATE, FERRITIN, TIBC, IRON, RETICCTPCT in the last 72 hours. Sepsis Labs: No results for input(s): PROCALCITON, LATICACIDVEN in the last 168 hours.  No results found for this or any previous visit (from the past 240 hour(s)).       Radiology Studies: Nm Myocar Multi W/spect W/wall Motion / Ef  Result Date: 06/20/2017  There was no ST segment deviation noted during stress.  Findings consistent with ischemia.  This is an intermediate risk study.  The left ventricular ejection fraction is mildly decreased (45-54%).  Large area of anterior apical ischemia SDS 13 No infarct EF 54%        Scheduled Meds: . acyclovir  200 mg Oral BID  . amLODipine  5 mg Oral Daily  . aspirin  324 mg Oral Daily  . buPROPion  150 mg Oral Daily  . enoxaparin (LOVENOX) injection  40 mg Subcutaneous Q24H  . hydrochlorothiazide  12.5 mg Oral Daily  . insulin aspart  0-5 Units Subcutaneous QHS  . insulin aspart  0-9 Units Subcutaneous TID WC  . metoprolol tartrate  25 mg Oral BID  . mometasone-formoterol  2 puff Inhalation BID  . nicotine  21 mg Transdermal Daily  . simvastatin  10 mg Oral q1800    Continuous Infusions: . sodium chloride 0.45 % with kcl 75 mL/hr at 06/21/17 2256     LOS: 1 day     Cordelia Poche, MD [facility] 06/22/2017, 10:41 AM Pager: [phone]  If 7PM-7AM, please contact night-coverage [email or web address] Password [email or web address] 06/22/2017, 10:41 AM

## 2017-06-23 ENCOUNTER — Inpatient Hospital Stay (HOSPITAL_COMMUNITY)
Admission: EM | Disposition: A | Discharge status: Home / Self Care | Payer: Self-pay | Source: Home / Self Care | Attending: Family Medicine

## 2017-06-23 DIAGNOSIS — E782 Mixed hyperlipidemia: Secondary | ICD-10-CM

## 2017-06-23 DIAGNOSIS — R0789 Other chest pain: Secondary | ICD-10-CM

## 2017-06-23 DIAGNOSIS — E876 Hypokalemia: Secondary | ICD-10-CM

## 2017-06-23 DIAGNOSIS — I10 Essential (primary) hypertension: Secondary | ICD-10-CM

## 2017-06-23 HISTORY — PX: LEFT HEART CATH AND CORONARY ANGIOGRAPHY: CATH118249

## 2017-06-23 LAB — PROTIME-INR
INR: 0.94
Prothrombin Time: 12.5 seconds (ref 11.4–15.2)

## 2017-06-23 LAB — GLUCOSE, CAPILLARY
Glucose-Capillary: 92 mg/dL (ref 65–99)
Glucose-Capillary: 95 mg/dL (ref 65–99)

## 2017-06-23 SURGERY — LEFT HEART CATH AND CORONARY ANGIOGRAPHY
Anesthesia: LOCAL

## 2017-06-23 MED ORDER — HEPARIN SODIUM (PORCINE) 1000 UNIT/ML IJ SOLN
INTRAMUSCULAR | Status: DC | PRN
  Administered 2017-06-23: 5000 [IU] via INTRAVENOUS

## 2017-06-23 MED ORDER — HEPARIN SODIUM (PORCINE) 1000 UNIT/ML IJ SOLN
INTRAMUSCULAR | Status: AC
  Filled 2017-06-23: qty 1

## 2017-06-23 MED ORDER — SODIUM CHLORIDE 0.9% FLUSH: 3.0000 mL | INTRAVENOUS | Status: DC | PRN

## 2017-06-23 MED ORDER — VERAPAMIL HCL 2.5 MG/ML IV SOLN
INTRAVENOUS | Status: AC
  Filled 2017-06-23: qty 2

## 2017-06-23 MED ORDER — MIDAZOLAM HCL 2 MG/2ML IJ SOLN
INTRAMUSCULAR | Status: DC | PRN
  Administered 2017-06-23 (×2): 1 mg via INTRAVENOUS

## 2017-06-23 MED ORDER — SODIUM CHLORIDE 0.9 % IV SOLN: 250.0000 mL | INTRAVENOUS | Status: DC | PRN

## 2017-06-23 MED ORDER — MIDAZOLAM HCL 2 MG/2ML IJ SOLN
INTRAMUSCULAR | Status: AC
  Filled 2017-06-23: qty 2

## 2017-06-23 MED ORDER — SODIUM CHLORIDE 0.9 % WEIGHT BASED INFUSION
1.0000 mL/kg/h | INTRAVENOUS | Status: DC
  Administered 2017-06-23 (×2): 1 mL/kg/h via INTRAVENOUS

## 2017-06-23 MED ORDER — IOPAMIDOL (ISOVUE-370) INJECTION 76%
INTRAVENOUS | Status: DC | PRN
  Administered 2017-06-23: 50 mL via INTRA_ARTERIAL

## 2017-06-23 MED ORDER — HYDRALAZINE HCL 20 MG/ML IJ SOLN
10.0000 mg | Freq: Once | INTRAMUSCULAR | Status: AC
  Administered 2017-06-23: 10 mg via INTRAVENOUS

## 2017-06-23 MED ORDER — SODIUM CHLORIDE 0.9% FLUSH: 3.0000 mL | Freq: Two times a day (BID) | INTRAVENOUS | Status: DC

## 2017-06-23 MED ORDER — HEPARIN (PORCINE) IN NACL 2-0.9 UNIT/ML-% IJ SOLN
INTRAMUSCULAR | Status: AC
  Filled 2017-06-23: qty 1000

## 2017-06-23 MED ORDER — SODIUM CHLORIDE 0.9 % IV SOLN: INTRAVENOUS | Status: DC

## 2017-06-23 MED ORDER — HYDRALAZINE HCL 20 MG/ML IJ SOLN
INTRAMUSCULAR | Status: AC
  Filled 2017-06-23: qty 1

## 2017-06-23 MED ORDER — FENTANYL CITRATE (PF) 100 MCG/2ML IJ SOLN
INTRAMUSCULAR | Status: DC | PRN
  Administered 2017-06-23: 50 ug via INTRAVENOUS
  Administered 2017-06-23: 25 ug via INTRAVENOUS

## 2017-06-23 MED ORDER — FENTANYL CITRATE (PF) 100 MCG/2ML IJ SOLN
INTRAMUSCULAR | Status: AC
  Filled 2017-06-23: qty 2

## 2017-06-23 MED ORDER — ENOXAPARIN SODIUM 40 MG/0.4ML ~~LOC~~ SOLN: 40.0000 mg | SUBCUTANEOUS | Status: DC

## 2017-06-23 MED ORDER — HEPARIN (PORCINE) IN NACL 2-0.9 UNIT/ML-% IJ SOLN
INTRAMUSCULAR | Status: AC | PRN
  Administered 2017-06-23: 1000 mL

## 2017-06-23 MED ORDER — ASPIRIN 81 MG PO CHEW
CHEWABLE_TABLET | ORAL | Status: AC
  Filled 2017-06-23: qty 4

## 2017-06-23 MED ORDER — LIDOCAINE HCL (PF) 1 % IJ SOLN
INTRAMUSCULAR | Status: DC | PRN
  Administered 2017-06-23: 2 mL

## 2017-06-23 SURGICAL SUPPLY — 9 items
CATH 5FR JL3.5 JR4 ANG PIG MP (CATHETERS) ×1 IMPLANT
DEVICE RAD COMP TR BAND LRG (VASCULAR PRODUCTS) ×1 IMPLANT
GLIDESHEATH SLEND SS 6F .021 (SHEATH) ×2 IMPLANT
GUIDEWIRE INQWIRE 1.5J.035X260 (WIRE) IMPLANT
INQWIRE 1.5J .035X260CM (WIRE) ×2
KIT HEART LEFT (KITS) ×2 IMPLANT
PACK CARDIAC CATHETERIZATION (CUSTOM PROCEDURE TRAY) ×2 IMPLANT
TRANSDUCER W/STOPCOCK (MISCELLANEOUS) ×2 IMPLANT
TUBING CIL FLEX 10 FLL-RA (TUBING) ×2 IMPLANT

## 2017-06-23 NOTE — Progress Notes (Signed)
Went over discharge paperwork with patient and family.  All questions answered.  VSS.  MD aware of patient BP.  Pt discharged via wheelchair.

## 2017-06-23 NOTE — Interval H&P Note (Signed)
History and Physical Interval Note:  06/23/2017 3:31 PM  Ambera Fedele  has presented today for cardiac catheterization, with the diagnosis of chest pain, abnormal stress test. The various methods of treatment have been discussed with the patient and family. After consideration of risks, benefits and other options for treatment, the patient has consented to  Procedure(s): LEFT HEART CATH AND CORONARY ANGIOGRAPHY (N/A) as a surgical intervention .  The patient's history has been reviewed, patient examined, no change in status, stable for surgery.  I have reviewed the patient's chart and labs.  Questions were answered to the patient's satisfaction.    Cath Lab Visit (complete for each Cath Lab visit)  Clinical Evaluation Leading to the Procedure:   ACS: Yes.    Non-ACS:    Anginal Classification: CCS IV  Anti-ischemic medical therapy: Maximal Therapy (2 or more classes of medications)  Non-Invasive Test Results: Intermediate-risk stress test findings: cardiac mortality 1-3%/year  Prior CABG: No previous CABG  Yancarlos Berthold

## 2017-06-23 NOTE — Progress Notes (Addendum)
TR BAND REMOVAL  LOCATION: Right    radial  DEFLATED PER PROTOCOL:   yes  TIME BAND OFF / DRESSING APPLIED:  1757    SITE UPON ARRIVAL:    Level 0  SITE AFTER BAND REMOVAL:    Level 0  CIRCULATION SENSATION AND MOVEMENT:    Within Normal Limits :yes  COMMENTS:

## 2017-06-23 NOTE — H&P (View-Only) (Signed)
Progress Note  Patient Name: Sydney Williams Date of Encounter: 06/23/2017  Primary Cardiologist: New Dr. Radford Pax  Subjective   No chest pain and no SOB.  Feeling well. Ready for cath.  Inpatient Medications    Scheduled Meds: . acyclovir  200 mg Oral BID  . amLODipine  5 mg Oral Daily  . aspirin  324 mg Oral Daily  . buPROPion  150 mg Oral Daily  . enoxaparin (LOVENOX) injection  40 mg Subcutaneous Q24H  . hydrochlorothiazide  12.5 mg Oral Daily  . insulin aspart  0-5 Units Subcutaneous QHS  . insulin aspart  0-9 Units Subcutaneous TID WC  . metoprolol tartrate  25 mg Oral BID  . mometasone-formoterol  2 puff Inhalation BID  . nicotine  21 mg Transdermal Daily  . simvastatin  10 mg Oral q1800   Continuous Infusions: . sodium chloride    . sodium chloride 1 mL/kg/hr (06/23/17 0444)   PRN Meds: sodium chloride, albuterol, hydrALAZINE, morphine injection, nitroGLYCERIN, ondansetron (ZOFRAN) IV, pantoprazole, traMADol, zolpidem   Vital Signs    Vitals:   06/22/17 1336 06/22/17 2157 06/22/17 2227 06/23/17 0455  BP: (!) 156/92 (!) 165/98 (!) 157/99 (!) 146/97  Pulse: 66 75 65 73  Resp: 16  17 18   Temp: 98 F (36.7 C)  98 F (36.7 C) 98.3 F (36.8 C)  TempSrc: Oral  Oral Oral  SpO2: 100%  100% 96%  Weight:    248 lb 0.3 oz (112.5 kg)  Height:        Intake/Output Summary (Last 24 hours) at 06/23/17 0857 Last data filed at 06/23/17 0600  Gross per 24 hour  Intake          1281.14 ml  Output                0 ml  Net          1281.14 ml   Filed Weights   06/19/17 2122 06/23/17 0455  Weight: 250 lb 1.6 oz (113.4 kg) 248 lb 0.3 oz (112.5 kg)    Telemetry    SR - Personally Reviewed  ECG    No new - Personally Reviewed  Physical Exam   GEN: No acute distress.   Neck: No JVD Cardiac: RRR, no murmurs, rubs, or gallops.  Respiratory: Clear to auscultation bilaterally. GI: Soft, nontender, non-distended  MS: No edema; No deformity. Neuro:  Nonfocal    Psych: Normal affect   Labs    Chemistry Recent Labs Lab 06/20/17 1610 06/21/17 0426 06/22/17 1047  NA 140 140 140  K 2.7* 3.1* 3.8  CL 98* 100* 102  CO2 31 33* 31  GLUCOSE 144* 110* 131*  BUN 6 7 8   CREATININE 0.51 0.51 0.57  CALCIUM 9.2 8.7* 9.0  GFRNONAA >60 >60 >60  GFRAA >60 >60 >60  ANIONGAP 11 7 7      Hematology Recent Labs Lab 06/19/17 1253  WBC 5.0  RBC 4.35  HGB 14.1  HCT 41.6  MCV 95.6  MCH 32.4  MCHC 33.9  RDW 14.5  PLT 210    Cardiac Enzymes Recent Labs Lab 06/19/17 2014 06/20/17 0149 06/20/17 1615  TROPONINI <0.03 <0.03 <0.03    Recent Labs Lab 06/19/17 1305  TROPIPOC 0.00     BNP Recent Labs Lab 06/20/17 1615  BNP 117.5*     DDimer  Recent Labs Lab 06/19/17 2014  DDIMER 0.50     Radiology    No results found.  Cardiac Studies  Echo 06/21/17 Study Conclusions  - Left ventricle: The cavity size was normal. There was mild   concentric hypertrophy. Systolic function was normal. The   estimated ejection fraction was in the range of 60% to 65%. Wall   motion was normal; there were no regional wall motion   abnormalities. Features are consistent with a pseudonormal left   ventricular filling pattern, with concomitant abnormal relaxation   and increased filling pressure (grade 2 diastolic dysfunction).   Doppler parameters are consistent with high ventricular filling   pressure. - Aortic valve: Trileaflet; mildly thickened, mildly calcified   leaflets. - Aorta: Ascending aorta diameter: 40 mm (ED). - Ascending aorta: The ascending aorta was mildly dilated. - Mitral valve: There was trivial regurgitation. - Left atrium: The atrium was mildly dilated.   Patient Profile     51 y.o. female with a hx of HTN, DM, HLD, and smokerwho is being seen for the evaluation of chest pain  Assessment & Plan    Unstable angina and abnormal stress test.  For cath today at 1300.  troponins have been neg for MI.  Normal EF and  G2 DD.   Hypokalemia resolved  Mildly dilated aorta at 40 mm  To be followed   DM-2 per Im  HTN controlled to elevated 146/97  HLD on statin  LDL 62, TG 312 HDL 51   Signed, Cecilie Kicks, NP  06/23/2017, 8:57 AM    Patient examined chart reviewed all questions/ risks for cath answered good right radial pulse on exam. K improved Increase amlodipine to 10 mg for BP control especially in setting of mild aortic root dilatation will likely stay at [facility] if intervention needed  [name]

## 2017-06-23 NOTE — Progress Notes (Signed)
Progress Note  Patient Name: Sydney Williams Date of Encounter: 06/23/2017  Primary Cardiologist: New Dr. Radford Pax  Subjective   No chest pain and no SOB.  Feeling well. Ready for cath.  Inpatient Medications    Scheduled Meds: . acyclovir  200 mg Oral BID  . amLODipine  5 mg Oral Daily  . aspirin  324 mg Oral Daily  . buPROPion  150 mg Oral Daily  . enoxaparin (LOVENOX) injection  40 mg Subcutaneous Q24H  . hydrochlorothiazide  12.5 mg Oral Daily  . insulin aspart  0-5 Units Subcutaneous QHS  . insulin aspart  0-9 Units Subcutaneous TID WC  . metoprolol tartrate  25 mg Oral BID  . mometasone-formoterol  2 puff Inhalation BID  . nicotine  21 mg Transdermal Daily  . simvastatin  10 mg Oral q1800   Continuous Infusions: . sodium chloride    . sodium chloride 1 mL/kg/hr (06/23/17 0444)   PRN Meds: sodium chloride, albuterol, hydrALAZINE, morphine injection, nitroGLYCERIN, ondansetron (ZOFRAN) IV, pantoprazole, traMADol, zolpidem   Vital Signs    Vitals:   06/22/17 1336 06/22/17 2157 06/22/17 2227 06/23/17 0455  BP: (!) 156/92 (!) 165/98 (!) 157/99 (!) 146/97  Pulse: 66 75 65 73  Resp: 16  17 18   Temp: 98 F (36.7 C)  98 F (36.7 C) 98.3 F (36.8 C)  TempSrc: Oral  Oral Oral  SpO2: 100%  100% 96%  Weight:    248 lb 0.3 oz (112.5 kg)  Height:        Intake/Output Summary (Last 24 hours) at 06/23/17 0857 Last data filed at 06/23/17 0600  Gross per 24 hour  Intake          1281.14 ml  Output                0 ml  Net          1281.14 ml   Filed Weights   06/19/17 2122 06/23/17 0455  Weight: 250 lb 1.6 oz (113.4 kg) 248 lb 0.3 oz (112.5 kg)    Telemetry    SR - Personally Reviewed  ECG    No new - Personally Reviewed  Physical Exam   GEN: No acute distress.   Neck: No JVD Cardiac: RRR, no murmurs, rubs, or gallops.  Respiratory: Clear to auscultation bilaterally. GI: Soft, nontender, non-distended  MS: No edema; No deformity. Neuro:  Nonfocal    Psych: Normal affect   Labs    Chemistry Recent Labs Lab 06/20/17 1610 06/21/17 0426 06/22/17 1047  NA 140 140 140  K 2.7* 3.1* 3.8  CL 98* 100* 102  CO2 31 33* 31  GLUCOSE 144* 110* 131*  BUN 6 7 8   CREATININE 0.51 0.51 0.57  CALCIUM 9.2 8.7* 9.0  GFRNONAA >60 >60 >60  GFRAA >60 >60 >60  ANIONGAP 11 7 7      Hematology Recent Labs Lab 06/19/17 1253  WBC 5.0  RBC 4.35  HGB 14.1  HCT 41.6  MCV 95.6  MCH 32.4  MCHC 33.9  RDW 14.5  PLT 210    Cardiac Enzymes Recent Labs Lab 06/19/17 2014 06/20/17 0149 06/20/17 1615  TROPONINI <0.03 <0.03 <0.03    Recent Labs Lab 06/19/17 1305  TROPIPOC 0.00     BNP Recent Labs Lab 06/20/17 1615  BNP 117.5*     DDimer  Recent Labs Lab 06/19/17 2014  DDIMER 0.50     Radiology    No results found.  Cardiac Studies  Echo 06/21/17 Study Conclusions  - Left ventricle: The cavity size was normal. There was mild   concentric hypertrophy. Systolic function was normal. The   estimated ejection fraction was in the range of 60% to 65%. Wall   motion was normal; there were no regional wall motion   abnormalities. Features are consistent with a pseudonormal left   ventricular filling pattern, with concomitant abnormal relaxation   and increased filling pressure (grade 2 diastolic dysfunction).   Doppler parameters are consistent with high ventricular filling   pressure. - Aortic valve: Trileaflet; mildly thickened, mildly calcified   leaflets. - Aorta: Ascending aorta diameter: 40 mm (ED). - Ascending aorta: The ascending aorta was mildly dilated. - Mitral valve: There was trivial regurgitation. - Left atrium: The atrium was mildly dilated.   Patient Profile     51 y.o. female with a hx of HTN, DM, HLD, and smokerwho is being seen for the evaluation of chest pain  Assessment & Plan    Unstable angina and abnormal stress test.  For cath today at 1300.  troponins have been neg for MI.  Normal EF and  G2 DD.   Hypokalemia resolved  Mildly dilated aorta at 40 mm  To be followed   DM-2 per Im  HTN controlled to elevated 146/97  HLD on statin  LDL 62, TG 312 HDL 51   Signed, Cecilie Kicks, NP  06/23/2017, 8:57 AM    Patient examined chart reviewed all questions/ risks for cath answered good right radial pulse on exam. K improved Increase amlodipine to 10 mg for BP control especially in setting of mild aortic root dilatation will likely stay at [facility] if intervention needed  [name]

## 2017-06-23 NOTE — Discharge Summary (Signed)
Sydney Williams, is a 51 y.o. female  DOB [birth year]  MRN [id number].  Admission date:  06/19/2017  Admitting Physician  Ivor Costa, MD  Discharge Date:  06/23/2017   Primary MD  Tysinger, Camelia Eng, PA-C  Recommendations for primary care physician for things to follow:   Chest pain D dimer negative Troponin cycled and negative  Chest pain resolved.  Echo 8/25=>   significant for grade 2 diastolic dysfunction. Myoview Stress test 8/25=>  significant for ischemia. Cath 8/27=> no significant CAD Check cmp in 1 week  Diabetes mellitus, type 2 Hemoglobin A1c 6.3  Can Resume metformin starting tomorrow  Asthma Continue dulera and albuterol  Essential hypertension Continue amlodipine Continue metoprolol Continue hydrochlorothiazide pcp to please check bp  May need to adjust medication  Hyperlipidemia Continue Zocor  Tobacco abuse Counseled on  Cessation  GERD Continue Protonix  Depression Continue Wellbutrin  Hypokalemia Hypomagnesemia Check magnesium in 1 week  Diastolic heart dysfunction EF of 60-65%. Grade 2 diastolic dysfunction.  Consultants:  Cardioliogy  Procedures:  Myoview stress test (06/20/17): intermediate risk, consistent with ischemia, EF of 45-54%  Cardiac cath 8/27=> negative  Antimicrobials:  None    Admission Diagnosis  Atypical chest pain [R07.89]   Discharge Diagnosis  Atypical chest pain [R07.89]    Principal Problem:   Atypical chest pain Active Problems:   Asthma   Diabetes mellitus without complication (HCC)   Smoker   Essential hypertension   Hyperlipidemia   Gastroesophageal reflux disease without esophagitis   Depression   Hypokalemia   Hypomagnesemia   Abnormal myocardial perfusion study      Past Medical History:  Diagnosis Date  . Asthma    2015 hospitaliation for asthma  . Diabetes mellitus    vs impaired fasting  glucose  . Family history of breast cancer   . Fibroids 2010  . Genital herpes   . GERD (gastroesophageal reflux disease)   . Hyperlipidemia   . Hypertension   . Impaired fasting blood sugar   . Migraines   . Obesity   . Smoker     Past Surgical History:  Procedure Laterality Date  . COLONOSCOPY  03/13/2016   Dr. Carlean Purl, normal, repeat 2027  . CYST EXCISION     left neck/postauricular region, benign  . LAPAROSCOPIC ABDOMINAL EXPLORATION     removal of ectopic preg  . TUBAL LIGATION         HPI  from the history and physical done on the day of admission:      51 y.o. female with medical history significant of hypertension, hyperlipidemia, diabetes mellitus, asthma, GERD, depression, tobacco abuse, obesity, migraine headache, who presents with chest pain.  Patient states that her chest pain started at noon. It is located in the left side of chest, intermittent, sharp, 8 out of 10 in severity, radiating to the left arm, causing left arm numbness. Her left arm numbness has resolved now. She also has dizziness. Denies shortness of breath, cough, fever or chills. Patient has nausea, but no  vomiting, abdominal pain or diarrhea. Denies symptoms of UTI. Patient states that she traveled to and from [place] from 6/29 to 7/10 by driving. Patient states that she is allergic to potassium chloride pill, but not allergic to IV potassium chloride. She is allergic to Vicodin, but is okay to use IV morphine  ED Course: pt was found to have negative D-dimer, negative troponin, WBC 5.0, potassium 3.0, creatinine normal, temperature normal, no tachycardia, O2 sat 96% on room air, negative chest x-ray.     Hospital Course:      Pt was admitted for cp and seen by cardiology who thought her chest pain was consistent with unstable angina.  Cardiac echo 8/25=> EF 60-65%, magnesium 8/23=> 1.6  , 8/24 myoview => intermediate risk EF 45-54%,   Replaced.  L heart cath 8/27=> tortuous left coronary  artery, no significant cad.  Pt is not having and cp.         .  Follow UP  Follow-up Information    Tysinger, Camelia Eng, PA-C Follow up in 1 week(s).   Specialty:  Family Medicine Contact information: [address] [place] [address] [phone]            Consults obtained - cardiology  Discharge Condition: stable  Diet and Activity recommendation: See Discharge Instructions below  Discharge Instructions         Discharge Medications     Allergies as of 06/23/2017      Reactions   Potassium Chloride Shortness Of Breath, Swelling   Tolerates IV KCl, reaction only to PO product   Pneumococcal Vaccines Swelling, Other (See Comments)   Reaction:  Swelling at injection site   Vicodin [hydrocodone-acetaminophen] Itching, Rash      Medication List    STOP taking these medications   cephALEXin 500 MG capsule Commonly known as:  KEFLEX   ibuprofen 200 MG tablet Commonly known as:  ADVIL,MOTRIN   ibuprofen 600 MG tablet Commonly known as:  ADVIL,MOTRIN     TAKE these medications   acyclovir 200 MG capsule Commonly known as:  ZOVIRAX Take 1 capsule (200 mg total) by mouth 2 (two) times daily.   albuterol 108 (90 Base) MCG/ACT inhaler Commonly known as:  PROVENTIL HFA;VENTOLIN HFA Inhale 1-2 puffs into the lungs every 6 (six) hours as needed for wheezing or shortness of breath.   amLODipine 10 MG tablet Commonly known as:  NORVASC Take 1 tablet (10 mg total) by mouth daily. What changed:  Another medication with the same name was removed. Continue taking this medication, and follow the directions you see here.   aspirin EC 81 MG tablet Take 1 tablet (81 mg total) by mouth daily.   buPROPion 150 MG 24 hr tablet Commonly known as:  WELLBUTRIN XL TAKE ONE TABLET BY MOUTH ONCE DAILY   Fluticasone-Salmeterol 250-50 MCG/DOSE Aepb Commonly known as:  ADVAIR Inhale 1 puff into the lungs 2 (two) times daily.   hydrochlorothiazide 12.5 MG  tablet Commonly known as:  HYDRODIURIL Take 12.5 mg by mouth daily. What changed:  Another medication with the same name was removed. Continue taking this medication, and follow the directions you see here.   metFORMIN 500 MG tablet Commonly known as:  GLUCOPHAGE TAKE ONE TABLET BY MOUTH ONCE DAILY WITH  BREAKFAST What changed:  how much to take  how to take this  when to take this  additional instructions   metoprolol tartrate 25 MG tablet Commonly known as:  LOPRESSOR Take 1 tablet (25 mg  total) by mouth 2 (two) times daily. 1 tablet po BID for blood pressure What changed:  how much to take  when to take this  additional instructions   nicotine 21 mg/24hr patch Commonly known as:  NICODERM CQ - dosed in mg/24 hours Place 1 patch (21 mg total) onto the skin daily.   predniSONE 50 MG tablet Commonly known as:  DELTASONE Take 50 mg by mouth See admin instructions. Takes 50mg  daily for five days prn for asthma attacks.   simvastatin 10 MG tablet Commonly known as:  ZOCOR Take 1 tablet (10 mg total) by mouth daily.   traMADol 50 MG tablet Commonly known as:  ULTRAM Take 1 tablet (50 mg total) by mouth every 6 (six) hours as needed. What changed:  reasons to take this   Vitamin D (Ergocalciferol) 50000 units Caps capsule Commonly known as:  DRISDOL Take 1 capsule (50,000 Units total) by mouth every 7 (seven) days. What changed:  when to take this       Major procedures and Radiology Reports - PLEASE review detailed and final reports for all details, in brief -     Dg Chest 2 View  Result Date: 06/19/2017 CLINICAL DATA:  Left side chest pain. EXAM: CHEST  2 VIEW COMPARISON:  12/04/2016 FINDINGS: Heart and mediastinal contours are within normal limits. No focal opacities or effusions. No acute bony abnormality. IMPRESSION: No active cardiopulmonary disease. Electronically Signed   By: Rolm Baptise M.D.   On: 06/19/2017 14:52   Nm Myocar Multi W/spect W/wall  Motion / Ef  Result Date: 06/20/2017  There was no ST segment deviation noted during stress.  Findings consistent with ischemia.  This is an intermediate risk study.  The left ventricular ejection fraction is mildly decreased (45-54%).  Large area of anterior apical ischemia SDS 13 No infarct EF 54%    Micro Results     No results found for this or any previous visit (from the past 240 hour(s)).     Today   Subjective    Sydney Williams today has no headache,no chest abdominal pain,no new weakness tingling or numbness, feels much better wants to go home today.    Objective   Blood pressure (!) 167/98, pulse 70, temperature 98.1 F (36.7 C), temperature source Oral, resp. rate 18, height 5\' 11"  (1.803 m), weight 112.5 kg (248 lb 0.3 oz), last menstrual period 06/29/2015, SpO2 98 %. THE BP was taken w Ns at 160mL per hour   Intake/Output Summary (Last 24 hours) at 06/23/17 1853 Last data filed at 06/23/17 1424  Gross per 24 hour  Intake           143.64 ml  Output                0 ml  Net           143.64 ml    Exam Awake Alert, Oriented x 3, No new F.N deficits, Normal affect [place].AT,PERRAL Supple Neck,No JVD, No cervical lymphadenopathy appriciated.  Symmetrical Chest wall movement, Good air movement bilaterally, CTAB RRR,No Gallops,Rubs or new Murmurs, No Parasternal Heave +ve B.Sounds, Abd Soft, Non tender, No organomegaly appriciated, No rebound -guarding or rigidity. No Cyanosis, Clubbing or edema, No new Rash or bruise   Data Review   CBC w Diff: Lab Results  Component Value Date   WBC 5.0 06/19/2017   HGB 14.1 06/19/2017   HCT 41.6 06/19/2017   PLT 210 06/19/2017   LYMPHOPCT 45 03/10/2017  MONOPCT 9 03/10/2017   EOSPCT 2 03/10/2017   BASOPCT 0 03/10/2017    CMP: Lab Results  Component Value Date   NA 140 06/22/2017   K 3.8 06/22/2017   CL 102 06/22/2017   CO2 31 06/22/2017   BUN 8 06/22/2017   CREATININE 0.57 06/22/2017   CREATININE 0.74  01/08/2017   PROT 7.9 03/10/2017   ALBUMIN 3.6 03/10/2017   BILITOT 0.3 03/10/2017   ALKPHOS 76 03/10/2017   AST 27 03/10/2017   ALT 22 03/10/2017  .   Total Time in preparing paper work, data evaluation and todays exam - 46 minutes  Jani Gravel M.D on 06/23/2017 at 6:53 PM  [facility]   Office  [phone]

## 2017-06-23 NOTE — Progress Notes (Signed)
Patient ID: Sydney Williams, female   DOB: 1966-03-29, 51 y.o.   MRN: [id number]                                                                PROGRESS NOTE                                                                                                                                                                                                             Patient Demographics:    Sydney Williams, is a 51 y.o. female, DOB - Jun 11, 1966, SLH:[id number]  Admit date - 06/19/2017   Admitting Physician Ivor Costa, MD  Outpatient Primary MD for the patient is Tysinger, Camelia Eng, PA-C  LOS - 2  Outpatient Specialists:     Chief Complaint  Patient presents with  . Chest Pain       Brief Narrative  51 y.o. the most a history of hypertension, hyperlipidemia, diabetes, asthma, GERD, depression, obesity, migraines. She presented with chest pain and was admitted for chest pain rule out. Plan for Myoview today. Chest pain resolved.    Subjective:    Sydney Williams today has no chest pain this am.  She is awaiting cardiac cath.    No headache,  No abdominal pain - No Nausea, No new weakness tingling or numbness, No Cough - SOB.     Assessment  & Plan :    Principal Problem:   Chest pain Active Problems:   Asthma   Diabetes mellitus without complication (HCC)   Smoker   Essential hypertension   Hyperlipidemia   Gastroesophageal reflux disease without esophagitis   Depression   Hypokalemia   Hypomagnesemia   Abnormal myocardial perfusion study   Chest pain Concern for ACS. D-dimer negative.  No pneumonia on chest x-ray.  Troponin cycled and negative to date.  Chest pain resolved.  Echo significant for grade 2 diastolic dysfunction. Stress test significant for ischemia. -Appreciate Cardiology recommendations: cath on 8/27 -continue nitro, morphine prn, aspirin, Zocor  Diabetes mellitus, type 2 Last hemoglobin A1c of 5.5 in 2017. On metformin as an outpatient -hold  metformin -SSI  Asthma -continue dulera and albuterol  Essential hypertension -continue amlodipine -continue metoprolol -continue hydrochlorothiazide  Hyperlipidemia -continue Zocor  Tobacco abuse Counseled on admission -nicotine patch prn  GERD -continue Protonix  Depression -continue Wellbutrin  Hypokalemia Hypomagnesemia -continue potassium containing IV fluids -Magnesium supplementation -BMP pending  Diastolic heart dysfunction EF of 60-65%. Grade 2 diastolic dysfunction.  DVT prophylaxis: Lovenox Code Status: Full code Family Communication: Husband at bedside Disposition Plan: Discharge pending cardiac workup   Consultants:   Cardioliogy  Procedures:   Myoview stress test (06/20/17): intermediate risk, consistent with ischemia, EF of 45-54%  Antimicrobials:  None      Lab Results  Component Value Date   PLT 210 06/19/2017      Anti-infectives    Start     Dose/Rate Route Frequency Ordered Stop   06/19/17 2200  acyclovir (ZOVIRAX) 200 MG capsule 200 mg     200 mg Oral 2 times daily 06/19/17 1926          Objective:   Vitals:   06/22/17 1336 06/22/17 2157 06/22/17 2227 06/23/17 0455  BP: (!) 156/92 (!) 165/98 (!) 157/99 (!) 146/97  Pulse: 66 75 65 73  Resp: 16  17 18   Temp: 98 F (36.7 C)  98 F (36.7 C) 98.3 F (36.8 C)  TempSrc: Oral  Oral Oral  SpO2: 100%  100% 96%  Weight:    112.5 kg (248 lb 0.3 oz)  Height:        Wt Readings from Last 3 Encounters:  06/23/17 112.5 kg (248 lb 0.3 oz)  04/21/17 111.6 kg (246 lb)  03/12/17 112.9 kg (248 lb 12.8 oz)     Intake/Output Summary (Last 24 hours) at 06/23/17 0500 Last data filed at 06/22/17 1302  Gross per 24 hour  Intake             2855 ml  Output             3000 ml  Net             -145 ml     Physical Exam  Awake Alert, Oriented X 3, No new F.N deficits, Normal affect [place].AT,PERRAL Supple Neck,No JVD, No cervical lymphadenopathy appriciated.   Symmetrical Chest wall movement, Good air movement bilaterally, CTAB RRR,No Gallops,Rubs or new Murmurs, No Parasternal Heave +ve B.Sounds, Abd Soft, No tenderness, No organomegaly appriciated, No rebound - guarding or rigidity. No Cyanosis, Clubbing or edema, No new Rash or bruise    Obese    Data Review:    CBC  Recent Labs Lab 06/19/17 1253  WBC 5.0  HGB 14.1  HCT 41.6  PLT 210  MCV 95.6  MCH 32.4  MCHC 33.9  RDW 14.5    Chemistries   Recent Labs Lab 06/19/17 1253 06/19/17 2014 06/20/17 1610 06/21/17 0426 06/22/17 1047  NA 141  --  140 140 140  K 3.0*  --  2.7* 3.1* 3.8  CL 97*  --  98* 100* 102  CO2 34*  --  31 33* 31  GLUCOSE 88  --  144* 110* 131*  BUN 15  --  6 7 8   CREATININE 0.78  --  0.51 0.51 0.57  CALCIUM 9.1  --  9.2 8.7* 9.0  MG  --  1.6* 1.6*  --  1.7   ------------------------------------------------------------------------------------------------------------------  Recent Labs  06/20/17 1615  CHOL 175  HDL 51  LDLCALC 62  TRIG 312*  CHOLHDL 3.4    Lab Results  Component Value Date   HGBA1C 6.3 (H) 06/20/2017   ------------------------------------------------------------------------------------------------------------------ No results for input(s): TSH, T4TOTAL, T3FREE, THYROIDAB in the last 72 hours.  Invalid input(s): FREET3 ------------------------------------------------------------------------------------------------------------------ No results for input(s):  VITAMINB12, FOLATE, FERRITIN, TIBC, IRON, RETICCTPCT in the last 72 hours.  Coagulation profile No results for input(s): INR, PROTIME in the last 168 hours.  No results for input(s): DDIMER in the last 72 hours.  Cardiac Enzymes  Recent Labs Lab 06/19/17 2014 06/20/17 0149 06/20/17 1615  TROPONINI <0.03 <0.03 <0.03   ------------------------------------------------------------------------------------------------------------------    Component Value  Date/Time   BNP 117.5 (H) 06/20/2017 1615    Inpatient Medications  Scheduled Meds: . acyclovir  200 mg Oral BID  . amLODipine  5 mg Oral Daily  . aspirin  324 mg Oral Daily  . buPROPion  150 mg Oral Daily  . enoxaparin (LOVENOX) injection  40 mg Subcutaneous Q24H  . hydrochlorothiazide  12.5 mg Oral Daily  . insulin aspart  0-5 Units Subcutaneous QHS  . insulin aspart  0-9 Units Subcutaneous TID WC  . metoprolol tartrate  25 mg Oral BID  . mometasone-formoterol  2 puff Inhalation BID  . nicotine  21 mg Transdermal Daily  . simvastatin  10 mg Oral q1800   Continuous Infusions: . sodium chloride    . sodium chloride 1 mL/kg/hr (06/23/17 0444)   PRN Meds:.sodium chloride, albuterol, hydrALAZINE, morphine injection, nitroGLYCERIN, ondansetron (ZOFRAN) IV, pantoprazole, traMADol, zolpidem  Micro Results No results found for this or any previous visit (from the past 240 hour(s)).  Radiology Reports Dg Chest 2 View  Result Date: 06/19/2017 CLINICAL DATA:  Left side chest pain. EXAM: CHEST  2 VIEW COMPARISON:  12/04/2016 FINDINGS: Heart and mediastinal contours are within normal limits. No focal opacities or effusions. No acute bony abnormality. IMPRESSION: No active cardiopulmonary disease. Electronically Signed   By: Rolm Baptise M.D.   On: 06/19/2017 14:52   Nm Myocar Multi W/spect W/wall Motion / Ef  Result Date: 06/20/2017  There was no ST segment deviation noted during stress.  Findings consistent with ischemia.  This is an intermediate risk study.  The left ventricular ejection fraction is mildly decreased (45-54%).  Large area of anterior apical ischemia SDS 13 No infarct EF 54%    Time Spent in minutes  30   Jani Gravel M.D on 06/23/2017 at 5:00 AM  Between 7am to 7pm - Pager - [phone]  After 7pm go to [email or web address] - password [email or web address]  [facility] -  Office  [phone]

## 2017-06-24 ENCOUNTER — Telehealth: Payer: Self-pay | Admitting: Medical

## 2017-06-24 ENCOUNTER — Encounter (HOSPITAL_COMMUNITY): Payer: Self-pay | Admitting: Internal Medicine

## 2017-06-24 NOTE — Telephone Encounter (Signed)
Called pt for recent hospital stay. Pt made an appt and was informed to bring all meds with her.

## 2017-06-26 MED FILL — Verapamil HCl IV Soln 2.5 MG/ML: INTRAVENOUS | Qty: 2 | Status: AC

## 2017-07-02 ENCOUNTER — Ambulatory Visit (INDEPENDENT_AMBULATORY_CARE_PROVIDER_SITE_OTHER): Payer: BLUE CROSS/BLUE SHIELD | Admitting: Medical

## 2017-07-02 ENCOUNTER — Encounter: Payer: Self-pay | Admitting: Medical

## 2017-07-02 VITALS — BP 144/86 | HR 71 | Wt 248.6 lb

## 2017-07-02 DIAGNOSIS — F17200 Nicotine dependence, unspecified, uncomplicated: Secondary | ICD-10-CM | POA: Diagnosis not present

## 2017-07-02 DIAGNOSIS — E782 Mixed hyperlipidemia: Secondary | ICD-10-CM

## 2017-07-02 DIAGNOSIS — E119 Type 2 diabetes mellitus without complications: Secondary | ICD-10-CM

## 2017-07-02 DIAGNOSIS — R0789 Other chest pain: Secondary | ICD-10-CM

## 2017-07-02 DIAGNOSIS — I10 Essential (primary) hypertension: Secondary | ICD-10-CM

## 2017-07-02 DIAGNOSIS — E876 Hypokalemia: Secondary | ICD-10-CM | POA: Diagnosis not present

## 2017-07-02 MED ORDER — METOPROLOL TARTRATE 50 MG PO TABS: 50.0000 mg | ORAL_TABLET | Freq: Two times a day (BID) | ORAL | 2 refills | Status: DC

## 2017-07-02 MED ORDER — HYDROCHLOROTHIAZIDE 12.5 MG PO TABS
12.5000 mg | ORAL_TABLET | Freq: Every day | ORAL | 2 refills | Status: DC
Start: 2017-07-02 — End: 2017-09-29

## 2017-07-02 NOTE — Progress Notes (Addendum)
Subjective: Chief Complaint  Patient presents with  . Hospitalization Follow-up    hospital follow up for chest pain , htn, high blood suger    Here for hospital f/u.  Was hospitalized 06/19/17 - 06/23/17 for chest pain.   Ended up having craterization, echo, stress test.    Since the hospitalization she denies any further chest pain.  Etiology was unclear.   In general she has been walking 5 days per week, exercising at least 87min daily.   She has cut back on salt.   Still smokes 1 cigarette in the morning and 1 at bedtime.  Tried nicotine patch, but it didn't help.  interestingly she notes no cravings at work. Exercise walking, 5 days wellness  Lives at home with husband.   Works 1st shift.   Past Medical History:  Diagnosis Date  . Asthma    2015 hospitaliation for asthma  . Diabetes mellitus    vs impaired fasting glucose  . Family history of breast cancer   . Fibroids 2010  . Genital herpes   . GERD (gastroesophageal reflux disease)   . Hyperlipidemia   . Hypertension   . Impaired fasting blood sugar   . Migraines   . Obesity   . Smoker    Current Outpatient Prescriptions on File Prior to Visit  Medication Sig Dispense Refill  . albuterol (PROVENTIL HFA;VENTOLIN HFA) 108 (90 Base) MCG/ACT inhaler Inhale 1-2 puffs into the lungs every 6 (six) hours as needed for wheezing or shortness of breath. 1 Inhaler 0  . amLODipine (NORVASC) 10 MG tablet Take 1 tablet (10 mg total) by mouth daily. 90 tablet 3  . aspirin EC 81 MG tablet Take 1 tablet (81 mg total) by mouth daily. 90 tablet 3  . buPROPion (WELLBUTRIN XL) 150 MG 24 hr tablet TAKE ONE TABLET BY MOUTH ONCE DAILY 30 tablet 2  . Fluticasone-Salmeterol (ADVAIR) 250-50 MCG/DOSE AEPB Inhale 1 puff into the lungs 2 (two) times daily. 60 each 11  . metFORMIN (GLUCOPHAGE) 500 MG tablet TAKE ONE TABLET BY MOUTH ONCE DAILY WITH  BREAKFAST (Patient taking differently: Take 500 mg by mouth daily with breakfast. ) 90 tablet 3  .  simvastatin (ZOCOR) 10 MG tablet Take 1 tablet (10 mg total) by mouth daily. 90 tablet 3  . traMADol (ULTRAM) 50 MG tablet Take 1 tablet (50 mg total) by mouth every 6 (six) hours as needed. (Patient taking differently: Take 50 mg by mouth every 6 (six) hours as needed for moderate pain. ) 15 tablet 0  . Vitamin D, Ergocalciferol, (DRISDOL) 50000 units CAPS capsule Take 1 capsule (50,000 Units total) by mouth every 7 (seven) days. (Patient taking differently: Take 50,000 Units by mouth every Sunday. ) 12 capsule 3  . predniSONE (DELTASONE) 50 MG tablet Take 50 mg by mouth See admin instructions. Takes 50mg  daily for five days prn for asthma attacks.  0   No current facility-administered medications on file prior to visit.    ROS as in subjective   Objective: BP (!) 144/86   Pulse 71   Wt 248 lb 9.6 oz (112.8 kg)   LMP 06/29/2015 (Exact Date)   SpO2 96%   BMI 34.67 kg/m   Wt Readings from Last 3 Encounters:  07/02/17 248 lb 9.6 oz (112.8 kg)  06/23/17 248 lb 0.3 oz (112.5 kg)  04/21/17 246 lb (111.6 kg)   BP Readings from Last 3 Encounters:  07/02/17 (!) 144/86  06/23/17 (!) 167/98  06/20/17 (!) 156/91  General appearance: alert, no distress, WD/WN,  Neck: supple, no lymphadenopathy, no thyromegaly, no masses Heart: RRR, normal S1, S2, no murmurs Lungs: CTA bilaterally, no wheezes, rhonchi, or rales Ext: no edema Pulses: 2+ symmetric, upper and lower extremities, normal cap refill Neuro: nonfocal exam      Assessment: Encounter Diagnoses  Name Primary?  . Essential hypertension Yes  . Hypomagnesemia   . Hypokalemia   . Atypical chest pain   . Mixed hyperlipidemia   . Diabetes mellitus without complication ([place])   . Smoker      Plan: HTN - not at goal..  Reviewed discharge summary, cath report, echo report, labs.   C/t Norvasc 10mg  daily, HCT, increase Metoprolol to 50mg  BID.   advised home BP monitoring, get me BP readings in 3-4 wk.  Work on losing more  weight, limiting salt, cutting back on animal products.   Hypomagnesemia - repeat lab today  Hypokalemia - repeat lab today.  Doesn't tolerate oral potassium.  In part related to low magnesium.     Atypical chest pain - resolved  dyslipidemia - c/t statin, lipid reviewed from 06/20/17, LDL 62, TRIG high at 312.  Diabetes -  HgbA1C of 6.3% on 06/20/17.   F/u pending labs today.   Consider adding Jardiance.    Smoker - counseled on ways to use another routine or habit at bedtime to get completley off tobacco.   Tisa was seen today for hospitalization follow-up.  Diagnoses and all orders for this visit:  Essential hypertension -     Comprehensive metabolic panel -     Magnesium  Hypomagnesemia -     Comprehensive metabolic panel -     Magnesium  Hypokalemia -     Comprehensive metabolic panel -     Magnesium  Atypical chest pain  Mixed hyperlipidemia  Diabetes mellitus without complication (HCC)  Smoker  Other orders -     metoprolol tartrate (LOPRESSOR) 50 MG tablet; Take 1 tablet (50 mg total) by mouth 2 (two) times daily. -     hydrochlorothiazide (HYDRODIURIL) 12.5 MG tablet; Take 1 tablet (12.5 mg total) by mouth daily.

## 2017-07-03 LAB — COMPREHENSIVE METABOLIC PANEL
AG Ratio: 1.1 (calc) (ref 1.0–2.5)
ALT: 14 U/L (ref 6–29)
AST: 18 U/L (ref 10–35)
Albumin: 3.8 g/dL (ref 3.6–5.1)
Alkaline phosphatase (APISO): 79 U/L (ref 33–130)
BUN: 9 mg/dL (ref 7–25)
CO2: 30 mmol/L (ref 20–32)
Calcium: 9.6 mg/dL (ref 8.6–10.4)
Chloride: 100 mmol/L (ref 98–110)
Creat: 0.54 mg/dL (ref 0.50–1.05)
Globulin: 3.6 g/dL (calc) (ref 1.9–3.7)
Glucose, Bld: 78 mg/dL (ref 65–99)
Potassium: 4 mmol/L (ref 3.5–5.3)
Sodium: 143 mmol/L (ref 135–146)
Total Bilirubin: 0.2 mg/dL (ref 0.2–1.2)
Total Protein: 7.4 g/dL (ref 6.1–8.1)

## 2017-07-03 LAB — MAGNESIUM: Magnesium: 1.6 mg/dL (ref 1.5–2.5)

## 2017-07-08 ENCOUNTER — Other Ambulatory Visit: Payer: Self-pay | Admitting: Medical

## 2017-07-08 MED ORDER — MAGNESIUM 100 MG PO TABS: 1.0000 | ORAL_TABLET | Freq: Every day | ORAL | 1 refills | Status: DC

## 2017-07-08 NOTE — Telephone Encounter (Signed)
Can pt have refill on meds  

## 2017-08-06 ENCOUNTER — Ambulatory Visit: Payer: Self-pay | Admitting: Medical

## 2017-08-07 ENCOUNTER — Encounter: Payer: Self-pay | Admitting: Medical

## 2017-09-29 ENCOUNTER — Encounter: Payer: Self-pay | Admitting: Medical

## 2017-09-29 ENCOUNTER — Ambulatory Visit: Payer: BLUE CROSS/BLUE SHIELD | Admitting: Medical

## 2017-09-29 VITALS — BP 154/90 | HR 68 | Wt 245.0 lb

## 2017-09-29 DIAGNOSIS — M545 Low back pain, unspecified: Secondary | ICD-10-CM | POA: Insufficient documentation

## 2017-09-29 DIAGNOSIS — I10 Essential (primary) hypertension: Secondary | ICD-10-CM | POA: Diagnosis not present

## 2017-09-29 MED ORDER — BISOPROLOL-HYDROCHLOROTHIAZIDE 10-6.25 MG PO TABS: 1.0000 | ORAL_TABLET | Freq: Every day | ORAL | 1 refills | Status: DC

## 2017-09-29 MED ORDER — OXYCODONE-ACETAMINOPHEN 5-325 MG PO TABS: 1.0000 | ORAL_TABLET | Freq: Three times a day (TID) | ORAL | 0 refills | Status: DC | PRN

## 2017-09-29 MED ORDER — METHOCARBAMOL 500 MG PO TABS: 500.0000 mg | ORAL_TABLET | Freq: Every evening | ORAL | 0 refills | Status: DC | PRN

## 2017-09-29 NOTE — Progress Notes (Signed)
Subjective:  Sydney Williams is a 51 y.o. female who presents for a one-month follow-up on high blood pressure.  She is compliant with medication but still notes home readings 140s/80-90s.  Eating bananas for potassium as she doesn't tolerate potassium pills   Denies chest pain, palpitations, edema.    She also reports back pain.  She notes hx/o back pain for years, intermittent flares, but worse in recent days.  No fever, no saddle anesthesia, no numbness or tingling.  He does have pain down legs, worse left side.   No recent trauma, injury or fall.  No abdominal pain, no urinary or bowel issues.  No other aggravating or relieving factors.    No other c/o.  The following portions of the patient's history were reviewed and updated as appropriate: allergies, current medications, past family history, past medical history, past social history, past surgical history and problem list.  ROS Otherwise as in subjective above  Past Medical History:  Diagnosis Date  . Asthma    2015 hospitaliation for asthma  . Diabetes mellitus    vs impaired fasting glucose  . Family history of breast cancer   . Fibroids 2010  . Genital herpes   . GERD (gastroesophageal reflux disease)   . Hyperlipidemia   . Hypertension   . Impaired fasting blood sugar   . Migraines   . Obesity   . Smoker    Current Outpatient Medications on File Prior to Visit  Medication Sig Dispense Refill  . albuterol (PROVENTIL HFA;VENTOLIN HFA) 108 (90 Base) MCG/ACT inhaler Inhale 1-2 puffs into the lungs every 6 (six) hours as needed for wheezing or shortness of breath. 1 Inhaler 0  . amLODipine (NORVASC) 10 MG tablet Take 1 tablet (10 mg total) by mouth daily. 90 tablet 3  . aspirin EC 81 MG tablet Take 1 tablet (81 mg total) by mouth daily. 90 tablet 3  . buPROPion (WELLBUTRIN XL) 150 MG 24 hr tablet TAKE 1 TABLET BY MOUTH ONCE DAILY 30 tablet 2  . Fluticasone-Salmeterol (ADVAIR) 250-50 MCG/DOSE AEPB Inhale 1 puff into the lungs 2  (two) times daily. 60 each 11  . Magnesium 100 MG TABS Take 1 tablet (100 mg total) by mouth daily. 30 tablet 1  . metFORMIN (GLUCOPHAGE) 500 MG tablet TAKE ONE TABLET BY MOUTH ONCE DAILY WITH  BREAKFAST (Patient taking differently: Take 500 mg by mouth daily with breakfast. ) 90 tablet 3  . simvastatin (ZOCOR) 10 MG tablet Take 1 tablet (10 mg total) by mouth daily. 90 tablet 3  . Vitamin D, Ergocalciferol, (DRISDOL) 50000 units CAPS capsule Take 1 capsule (50,000 Units total) by mouth every 7 (seven) days. (Patient taking differently: Take 50,000 Units by mouth every Sunday. ) 12 capsule 3  . predniSONE (DELTASONE) 50 MG tablet Take 50 mg by mouth See admin instructions. Takes 50mg  daily for five days prn for asthma attacks.  0   No current facility-administered medications on file prior to visit.      Objective: BP (!) 154/90   Pulse 68   Wt 245 lb (111.1 kg)   LMP 06/29/2015 (Exact Date)   SpO2 94%   BMI 34.17 kg/m   General appearance: alert, no distress, WD/WN Neck: supple, no lymphadenopathy, no thyromegaly, no masses Heart: RRR, normal S1, S2, no murmurs Lungs: CTA bilaterally, no wheezes, rhonchi, or rales Abdomen: +bs, soft, non tender, non distended, no masses, no hepatomegaly, no splenomegaly Back: Tender throughout the lumbar region, pain with range of motion which  is limited to about 80% of normal Legs: Nontender, seemingly normal range of motion Legs with normal sensation and strength, negative straight leg raise, normal heel and toe walk Pulses: 2+ radial pulses, 2+ pedal pulses, normal cap refill Ext: no edema   Assessment: Encounter Diagnoses  Name Primary?  . Essential hypertension Yes  . Low back pain, unspecified back pain laterality, unspecified chronicity, with sciatica presence unspecified      Plan: Hypertension-continue amlodipine 10 mg.  Stop metoprolol and hydrochlorothiazide.  Begin combo bisoprolol hydrochlorothiazide as below.  Continue blood  pressure monitoring, continue efforts to lose weight through healthy diet and exercise  Low back pain- discussed possible causes, advised to work on stretching and core strengthening exercises regularly as discussed.  She will go for back x-ray.  She can use Robaxin and oxycodone as needed as below  Cynthya was seen today for follow-up.  Diagnoses and all orders for this visit:  Essential hypertension  Low back pain, unspecified back pain laterality, unspecified chronicity, with sciatica presence unspecified -     DG Lumbar Spine Complete; Future  Other orders -     bisoprolol-hydrochlorothiazide (ZIAC) 10-6.25 MG tablet; Take 1 tablet by mouth daily. -     methocarbamol (ROBAXIN) 500 MG tablet; Take 1 tablet (500 mg total) by mouth at bedtime as needed for muscle spasms. -     oxyCODONE-acetaminophen (ROXICET) 5-325 MG tablet; Take 1 tablet by mouth every 8 (eight) hours as needed for severe pain.   Follow up: 42mo

## 2017-10-22 ENCOUNTER — Emergency Department (HOSPITAL_COMMUNITY): Payer: BLUE CROSS/BLUE SHIELD

## 2017-10-22 ENCOUNTER — Encounter (HOSPITAL_COMMUNITY): Payer: Self-pay | Admitting: Emergency Medicine

## 2017-10-22 ENCOUNTER — Emergency Department (HOSPITAL_COMMUNITY)
Admission: EM | Admit: 2017-10-22 | Discharge: 2017-10-22 | Disposition: A | Discharge status: Home / Self Care | Payer: BLUE CROSS/BLUE SHIELD | Attending: Emergency Medicine | Admitting: Emergency Medicine

## 2017-10-22 DIAGNOSIS — J40 Bronchitis, not specified as acute or chronic: Secondary | ICD-10-CM | POA: Insufficient documentation

## 2017-10-22 DIAGNOSIS — Z7984 Long term (current) use of oral hypoglycemic drugs: Secondary | ICD-10-CM | POA: Diagnosis not present

## 2017-10-22 DIAGNOSIS — F329 Major depressive disorder, single episode, unspecified: Secondary | ICD-10-CM | POA: Insufficient documentation

## 2017-10-22 DIAGNOSIS — Z79899 Other long term (current) drug therapy: Secondary | ICD-10-CM | POA: Insufficient documentation

## 2017-10-22 DIAGNOSIS — E119 Type 2 diabetes mellitus without complications: Secondary | ICD-10-CM | POA: Insufficient documentation

## 2017-10-22 DIAGNOSIS — R05 Cough: Secondary | ICD-10-CM | POA: Diagnosis present

## 2017-10-22 DIAGNOSIS — I10 Essential (primary) hypertension: Secondary | ICD-10-CM | POA: Insufficient documentation

## 2017-10-22 DIAGNOSIS — F17210 Nicotine dependence, cigarettes, uncomplicated: Secondary | ICD-10-CM | POA: Insufficient documentation

## 2017-10-22 LAB — INFLUENZA PANEL BY PCR (TYPE A & B)
Influenza A By PCR: NEGATIVE
Influenza B By PCR: NEGATIVE

## 2017-10-22 MED ORDER — AZITHROMYCIN 250 MG PO TABS: ORAL_TABLET | ORAL | 0 refills | Status: DC

## 2017-10-22 MED ORDER — BENZONATATE 100 MG PO CAPS: 100.0000 mg | ORAL_CAPSULE | Freq: Three times a day (TID) | ORAL | 0 refills | Status: DC

## 2017-10-22 MED ORDER — IPRATROPIUM-ALBUTEROL 0.5-2.5 (3) MG/3ML IN SOLN
3.0000 mL | Freq: Once | RESPIRATORY_TRACT | Status: AC
Start: 2017-10-22 — End: 2017-10-22
  Administered 2017-10-22: 3 mL via RESPIRATORY_TRACT
  Filled 2017-10-22: qty 3

## 2017-10-22 NOTE — Discharge Instructions (Signed)
Use your inhaler as needed. Follow up with your doctor. Return here for worsening symptoms.

## 2017-10-22 NOTE — ED Notes (Signed)
Patient transported to X-ray 

## 2017-10-22 NOTE — ED Notes (Signed)
Pt verbalizes understanding of d/c paperwork, follow up instructions, and medications. Pt A/O x4, ambulatory. All belongings with patient upon departure.  

## 2017-10-22 NOTE — ED Triage Notes (Signed)
Patient c/o chills, sore throat, productive cough, and congestion since yesterday. Reports using her inhalers with relief. Hx asthma.

## 2017-10-22 NOTE — ED Provider Notes (Signed)
[facility] DEPT Provider Note   CSN: [id number] Arrival date & time: 10/22/17  1348     History   Chief Complaint Chief Complaint  Patient presents with  . Cough  . Nasal Congestion  . Sore Throat    HPI Sydney Williams is a 51 y.o. female who presents to the ED with flu like symptoms that started yesterday. Patient reports low grade fever, chills and aching all over. She went to work but her symptoms worsened. Patient states she did have her flu shot. Patient has been using her inhaler to try to help with the symptoms.   The history is provided by the patient. No language interpreter was used.  Cough  This is a new problem. The current episode started 12 to 24 hours ago. The problem has been gradually worsening. The cough is productive of sputum. The maximum temperature recorded prior to her arrival was 100 to 100.9 F. Associated symptoms include chills, headaches, rhinorrhea, sore throat, myalgias and wheezing. Pertinent negatives include no chest pain, no ear pain and no shortness of breath. She has tried decongestants for the symptoms. She is a smoker. Her past medical history is significant for bronchitis and asthma. Her past medical history does not include pneumonia or COPD.  Sore Throat  Associated symptoms include headaches. Pertinent negatives include no chest pain and no shortness of breath. Abdominal pain: sore when coughing.    Past Medical History:  Diagnosis Date  . Asthma    2015 hospitaliation for asthma  . Diabetes mellitus    vs impaired fasting glucose  . Family history of breast cancer   . Fibroids 2010  . Genital herpes   . GERD (gastroesophageal reflux disease)   . Hyperlipidemia   . Hypertension   . Impaired fasting blood sugar   . Migraines   . Obesity   . Smoker     Patient Active Problem List   Diagnosis Date Noted  . Low back pain 09/29/2017  . Abnormal myocardial perfusion study   . Depression 06/19/2017  .  Atypical chest pain 06/19/2017  . Hypokalemia 06/19/2017  . Hypomagnesemia 06/19/2017  . Vaccine counseling 01/08/2017  . 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 Screening for cervical cancer 01/08/2017  . Routine general medical examination at a health care facility 01/08/2017  . Snoring 09/11/2016  . History of migraine 09/11/2016  . Frequent headaches 09/11/2016  . Muscle cramp 09/11/2016  . Encounter for health maintenance examination in adult 12/04/2015  . Heel pain, bilateral 12/04/2015  . Screening for breast cancer 12/04/2015  . Need for influenza vaccination 12/04/2015  . Moderate persistent asthma 06/01/2015  . Gastroesophageal reflux disease without esophagitis 06/01/2015  . Rhinitis, allergic 06/01/2015  . Genital herpes 11/30/2014  . Smoker 11/30/2014  . Essential hypertension 11/30/2014  . Hyperlipidemia 11/30/2014  . Impaired fasting blood sugar 11/30/2014  . Asthma 07/02/2012  . Obesity 07/02/2012    Past Surgical History:  Procedure Laterality Date  . COLONOSCOPY  03/13/2016   Dr. Carlean Purl, normal, repeat 2027  . CYST EXCISION     left neck/postauricular region, benign  . LAPAROSCOPIC ABDOMINAL EXPLORATION     removal of ectopic preg  . LEFT HEART CATH AND CORONARY ANGIOGRAPHY N/A 06/23/2017   Procedure: LEFT HEART CATH AND CORONARY ANGIOGRAPHY;  Surgeon: Nelva Bush, MD;  Location: [facility];  Service: Cardiovascular;  Laterality: N/A;  . TUBAL LIGATION      OB History    Gravida Para Term  Preterm AB Living   5 1 1   2 3    SAB TAB Ectopic Multiple Live Births   1   1           Home Medications    Prior to Admission medications   Medication Sig Start Date End Date Taking? Authorizing Provider  albuterol (PROVENTIL HFA;VENTOLIN HFA) 108 (90 Base) MCG/ACT inhaler Inhale 1-2 puffs into the lungs every 6 (six) hours as needed for wheezing or shortness of breath. 01/08/17   Tysinger, Camelia Eng, PA-C  amLODipine (NORVASC) 10 MG  tablet Take 1 tablet (10 mg total) by mouth daily. 03/12/17   Tysinger, Camelia Eng, PA-C  aspirin EC 81 MG tablet Take 1 tablet (81 mg total) by mouth daily. 01/08/17   Tysinger, Camelia Eng, PA-C  azithromycin (ZITHROMAX Z-PAK) 250 MG tablet Take the first 2 tablets today and then one tablet PO daily for infection 10/22/17   Ashley Murrain, NP  benzonatate (TESSALON) 100 MG capsule Take 1 capsule (100 mg total) by mouth every 8 (eight) hours. 10/22/17   Ashley Murrain, NP  bisoprolol-hydrochlorothiazide [facility]) 10-6.25 MG tablet Take 1 tablet by mouth daily. 09/29/17   Tysinger, Camelia Eng, PA-C  buPROPion (WELLBUTRIN XL) 150 MG 24 hr tablet TAKE 1 TABLET BY MOUTH ONCE DAILY 07/08/17   Tysinger, Camelia Eng, PA-C  Fluticasone-Salmeterol (ADVAIR) 250-50 MCG/DOSE AEPB Inhale 1 puff into the lungs 2 (two) times daily. 01/08/17   Tysinger, Camelia Eng, PA-C  Magnesium 100 MG TABS Take 1 tablet (100 mg total) by mouth daily. 07/08/17   Tysinger, Camelia Eng, PA-C  metFORMIN (GLUCOPHAGE) 500 MG tablet TAKE ONE TABLET BY MOUTH ONCE DAILY WITH  BREAKFAST Patient taking differently: Take 500 mg by mouth daily with breakfast.  01/08/17   Tysinger, Camelia Eng, PA-C  methocarbamol (ROBAXIN) 500 MG tablet Take 1 tablet (500 mg total) by mouth at bedtime as needed for muscle spasms. 09/29/17   Tysinger, Camelia Eng, PA-C  oxyCODONE-acetaminophen (ROXICET) 5-325 MG tablet Take 1 tablet by mouth every 8 (eight) hours as needed for severe pain. 09/29/17   Tysinger, Camelia Eng, PA-C  predniSONE (DELTASONE) 50 MG tablet Take 50 mg by mouth See admin instructions. Takes 50mg  daily for five days prn for asthma attacks. 03/13/17   [provider]  simvastatin (ZOCOR) 10 MG tablet Take 1 tablet (10 mg total) by mouth daily. 01/08/17   Tysinger, Camelia Eng, PA-C  Vitamin D, Ergocalciferol, (DRISDOL) 50000 units CAPS capsule Take 1 capsule (50,000 Units total) by mouth every 7 (seven) days. Patient taking differently: Take 50,000 Units by mouth every Sunday.   01/10/17   Tysinger, Camelia Eng, PA-C    Family History Family History  Problem Relation Age of Onset  . Diabetes Mother   . Hypertension Mother   . Aneurysm Mother   . Stroke Mother   . Cancer Mother        cervical cancer  . Cancer Maternal Aunt        breast  . Cancer Cousin        breast/breast  . Heart disease Neg Hx   . Colon cancer Neg Hx     Social History Social History   Tobacco Use  . Smoking status: Current Every Day Smoker    Packs/day: 0.25    Years: 13.00    Pack years: 3.25    Types: Cigarettes  . Smokeless tobacco: Never Used  Substance Use Topics  . Alcohol use: Yes  Comment: occ  . Drug use: No     Allergies   Potassium chloride; Pneumococcal vaccines; and Vicodin [hydrocodone-acetaminophen]   Review of Systems Review of Systems  Constitutional: Positive for chills.  HENT: Positive for rhinorrhea and sore throat. Negative for ear pain.   Respiratory: Positive for cough and wheezing. Negative for shortness of breath.   Cardiovascular: Negative for chest pain.  Gastrointestinal: Negative for nausea and vomiting. Abdominal pain: sore when coughing.  Genitourinary: Negative for dysuria, frequency and urgency.  Musculoskeletal: Positive for myalgias.  Skin: Negative for rash.  Neurological: Positive for headaches. Negative for dizziness and syncope.  Psychiatric/Behavioral: Negative for confusion.     Physical Exam Updated Vital Signs BP (!) 155/100 (BP Location: Left Arm)   Pulse 89   Temp 98.3 F (36.8 C) (Oral)   Resp 18   LMP 06/29/2015 (Exact Date)   SpO2 95%   Physical Exam  Constitutional: She appears well-developed and well-nourished. No distress.  HENT:  Head: Normocephalic and atraumatic.  Right Ear: Tympanic membrane normal.  Left Ear: Tympanic membrane normal.  Nose: Nose normal.  Mouth/Throat: Uvula is midline. Posterior oropharyngeal erythema present. No oropharyngeal exudate.  Eyes: Conjunctivae and EOM are normal.  Pupils are equal, round, and reactive to light.  Neck: Normal range of motion. Neck supple.  Cardiovascular: Normal rate and regular rhythm.  Pulmonary/Chest: Effort normal. No respiratory distress. She has wheezes. She has no rales. She exhibits tenderness.  Abdominal: Soft. Bowel sounds are normal. There is no tenderness.  Musculoskeletal: Normal range of motion.  Lymphadenopathy:    She has no cervical adenopathy.  Neurological: She is alert.  Skin: Skin is warm and dry.  Psychiatric: She has a normal mood and affect. Her behavior is normal.  Nursing note and vitals reviewed.    ED Treatments / Results  Labs (all labs ordered are listed, but only abnormal results are displayed) Labs Reviewed  INFLUENZA PANEL BY PCR (TYPE A & B)    Radiology Dg Chest 2 View  Result Date: 10/22/2017 CLINICAL DATA:  Cough and fever EXAM: CHEST  2 VIEW COMPARISON:  June 19, 2017 FINDINGS: There is mild atelectatic change in the lateral right base. Lungs elsewhere are clear. Heart size and pulmonary vascularity are normal. There is aortic atherosclerosis. No adenopathy. There is mild degenerative change in the thoracic spine. There is a cervical rib on the right. There is a rudimentary cervical rib on the left. IMPRESSION: Slight right base atelectasis. Lungs elsewhere clear. Heart size normal. There is aortic atherosclerosis. Aortic Atherosclerosis (ICD10-I70.0). Electronically Signed   By: Lowella Grip III M.D.   On: 10/22/2017 15:03    Procedures Procedures (including critical care time)  Medications Ordered in ED Medications  ipratropium-albuterol (DUONEB) 0.5-2.5 (3) MG/3ML nebulizer solution 3 mL (3 mLs Nebulization Given 10/22/17 1512)     Initial Impression / Assessment and Plan / ED Course  I have reviewed the triage vital signs and the nursing notes. Pt CXR negative for acute infiltrate. Patients symptoms are consistent with bronchitis. Pt will be discharged with symptomatic  treatment.  Verbalizes understanding and is agreeable with plan. Pt is hemodynamically stable & in NAD prior to dc.  Encouraged patient to stop smoking.  Final Clinical Impressions(s) / ED Diagnoses   Final diagnoses:  Bronchitis    ED Discharge Orders        Ordered    azithromycin (ZITHROMAX Z-PAK) 250 MG tablet     10/22/17 1606    benzonatate (TESSALON)  100 MG capsule  Every 8 hours     10/22/17 Camas, Amiria Orrison [place], [place] 10/22/17 2235    Daleen Bo, MD 10/23/17 [phone]

## 2017-11-06 ENCOUNTER — Other Ambulatory Visit: Payer: Self-pay | Admitting: Occupational Medicine

## 2017-11-06 ENCOUNTER — Ambulatory Visit: Payer: Self-pay

## 2017-11-06 DIAGNOSIS — M545 Low back pain: Secondary | ICD-10-CM

## 2017-11-17 ENCOUNTER — Other Ambulatory Visit: Payer: Self-pay | Admitting: Medical

## 2017-11-17 NOTE — Telephone Encounter (Signed)
Can pt have a refill on meds  

## 2017-11-30 ENCOUNTER — Other Ambulatory Visit: Payer: Self-pay | Admitting: Medical

## 2017-12-15 ENCOUNTER — Emergency Department (HOSPITAL_COMMUNITY)
Admission: EM | Admit: 2017-12-15 | Discharge: 2017-12-15 | Disposition: A | Discharge status: Home / Self Care | Payer: BLUE CROSS/BLUE SHIELD | Attending: Emergency Medicine | Admitting: Emergency Medicine

## 2017-12-15 ENCOUNTER — Other Ambulatory Visit: Payer: Self-pay

## 2017-12-15 ENCOUNTER — Encounter (HOSPITAL_COMMUNITY): Payer: Self-pay | Admitting: Emergency Medicine

## 2017-12-15 DIAGNOSIS — J111 Influenza due to unidentified influenza virus with other respiratory manifestations: Secondary | ICD-10-CM

## 2017-12-15 DIAGNOSIS — E119 Type 2 diabetes mellitus without complications: Secondary | ICD-10-CM | POA: Diagnosis not present

## 2017-12-15 DIAGNOSIS — R69 Illness, unspecified: Secondary | ICD-10-CM | POA: Insufficient documentation

## 2017-12-15 DIAGNOSIS — Z7982 Long term (current) use of aspirin: Secondary | ICD-10-CM | POA: Insufficient documentation

## 2017-12-15 DIAGNOSIS — Z7984 Long term (current) use of oral hypoglycemic drugs: Secondary | ICD-10-CM | POA: Diagnosis not present

## 2017-12-15 DIAGNOSIS — Z20828 Contact with and (suspected) exposure to other viral communicable diseases: Secondary | ICD-10-CM | POA: Diagnosis not present

## 2017-12-15 DIAGNOSIS — M791 Myalgia, unspecified site: Secondary | ICD-10-CM | POA: Diagnosis not present

## 2017-12-15 DIAGNOSIS — J45909 Unspecified asthma, uncomplicated: Secondary | ICD-10-CM | POA: Insufficient documentation

## 2017-12-15 DIAGNOSIS — R509 Fever, unspecified: Secondary | ICD-10-CM | POA: Insufficient documentation

## 2017-12-15 DIAGNOSIS — J3489 Other specified disorders of nose and nasal sinuses: Secondary | ICD-10-CM | POA: Diagnosis not present

## 2017-12-15 DIAGNOSIS — I10 Essential (primary) hypertension: Secondary | ICD-10-CM | POA: Insufficient documentation

## 2017-12-15 DIAGNOSIS — Z79899 Other long term (current) drug therapy: Secondary | ICD-10-CM | POA: Insufficient documentation

## 2017-12-15 DIAGNOSIS — R05 Cough: Secondary | ICD-10-CM | POA: Diagnosis present

## 2017-12-15 DIAGNOSIS — F17210 Nicotine dependence, cigarettes, uncomplicated: Secondary | ICD-10-CM | POA: Diagnosis not present

## 2017-12-15 DIAGNOSIS — J029 Acute pharyngitis, unspecified: Secondary | ICD-10-CM | POA: Insufficient documentation

## 2017-12-15 MED ORDER — FLUTICASONE PROPIONATE 50 MCG/ACT NA SUSP: 2.0000 | Freq: Every day | NASAL | 12 refills | Status: DC

## 2017-12-15 MED ORDER — OSELTAMIVIR PHOSPHATE 75 MG PO CAPS: 75.0000 mg | ORAL_CAPSULE | Freq: Two times a day (BID) | ORAL | 0 refills | Status: DC

## 2017-12-15 MED ORDER — BENZONATATE 100 MG PO CAPS: 100.0000 mg | ORAL_CAPSULE | Freq: Three times a day (TID) | ORAL | 0 refills | Status: DC

## 2017-12-15 NOTE — ED Provider Notes (Signed)
[facility] DEPT Provider Note   CSN: [id number] Arrival date & time: 12/15/17  0520     History   Chief Complaint Chief Complaint  Patient presents with  . flu like symptoms    HPI Sydney Williams is a 52 y.o. female.  HPI 52 year old female past medical history significant for asthma, hypertension that presents to the emergency department today for evaluation of influenza-like illness.  Specifically patient complains of generalized body aches, rhinorrhea, sore throat, cough, fevers.  She reports that her husband, 2 daughters and 2 grandchildren both have the flu or confirmed flu positive.  Patient states that her symptoms started approximately 36 hours ago.  She reports yellow-green sputum production and rhinorrhea.  Denies any associated vomiting or diarrhea.  Patient states that she knows that she has the flu.  She is been using TheraFlu over-the-counter to help with her symptoms.  Patient afebrile at this time.  Denies any associated shortness of breath, wheezing, abdominal pain, urinary symptoms or change in bowel habits. Past Medical History:  Diagnosis Date  . Asthma    2015 hospitaliation for asthma  . Diabetes mellitus    vs impaired fasting glucose  . Family history of breast cancer   . Fibroids 2010  . Genital herpes   . GERD (gastroesophageal reflux disease)   . Hyperlipidemia   . Hypertension   . Impaired fasting blood sugar   . Migraines   . Obesity   . Smoker     Patient Active Problem List   Diagnosis Date Noted  . Low back pain 09/29/2017  . Abnormal myocardial perfusion study   . Depression 06/19/2017  . Atypical chest pain 06/19/2017  . Hypokalemia 06/19/2017  . Hypomagnesemia 06/19/2017  . Vaccine counseling 01/08/2017  . 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 Screening for cervical cancer 01/08/2017  . Routine general medical examination at a health care facility 01/08/2017  . Snoring 09/11/2016  .  History of migraine 09/11/2016  . Frequent headaches 09/11/2016  . Muscle cramp 09/11/2016  . Encounter for health maintenance examination in adult 12/04/2015  . Heel pain, bilateral 12/04/2015  . Screening for breast cancer 12/04/2015  . Need for influenza vaccination 12/04/2015  . Moderate persistent asthma 06/01/2015  . Gastroesophageal reflux disease without esophagitis 06/01/2015  . Rhinitis, allergic 06/01/2015  . Genital herpes 11/30/2014  . Smoker 11/30/2014  . Essential hypertension 11/30/2014  . Hyperlipidemia 11/30/2014  . Impaired fasting blood sugar 11/30/2014  . Asthma 07/02/2012  . Obesity 07/02/2012    Past Surgical History:  Procedure Laterality Date  . COLONOSCOPY  03/13/2016   Dr. Carlean Purl, normal, repeat 2027  . CYST EXCISION     left neck/postauricular region, benign  . LAPAROSCOPIC ABDOMINAL EXPLORATION     removal of ectopic preg  . LEFT HEART CATH AND CORONARY ANGIOGRAPHY N/A 06/23/2017   Procedure: LEFT HEART CATH AND CORONARY ANGIOGRAPHY;  Surgeon: Nelva Bush, MD;  Location: [facility];  Service: Cardiovascular;  Laterality: N/A;  . TUBAL LIGATION      OB History    Gravida Para Term Preterm AB Living   5 1 1   2 3    SAB TAB Ectopic Multiple Live Births   1   1           Home Medications    Prior to Admission medications   Medication Sig Start Date End Date Taking? Authorizing Provider  albuterol (PROVENTIL HFA;VENTOLIN HFA) 108 (90 Base) MCG/ACT inhaler Inhale 1-2 puffs  into the lungs every 6 (six) hours as needed for wheezing or shortness of breath. 01/08/17   Tysinger, Camelia Eng, PA-C  amLODipine (NORVASC) 10 MG tablet Take 1 tablet (10 mg total) by mouth daily. 03/12/17   Tysinger, Camelia Eng, PA-C  amLODipine (NORVASC) 5 MG tablet TAKE ONE TABLET BY MOUTH ONCE DAILY 12/01/17   Tysinger, Camelia Eng, PA-C  aspirin EC 81 MG tablet Take 1 tablet (81 mg total) by mouth daily. 01/08/17   Tysinger, Camelia Eng, PA-C  azithromycin (ZITHROMAX Z-PAK)  250 MG tablet Take the first 2 tablets today and then one tablet PO daily for infection 10/22/17   Ashley Murrain, NP  benzonatate (TESSALON) 100 MG capsule Take 1 capsule (100 mg total) by mouth every 8 (eight) hours. 12/15/17   Doristine Devoid, PA-C  bisoprolol-hydrochlorothiazide (ZIAC) 10-6.25 MG tablet Take 1 tablet by mouth daily. 09/29/17   Tysinger, Camelia Eng, PA-C  buPROPion (WELLBUTRIN XL) 150 MG 24 hr tablet TAKE 1 TABLET BY MOUTH ONCE DAILY 11/17/17   Tysinger, Camelia Eng, PA-C  fluticasone [facility]) 50 MCG/ACT nasal spray Place 2 sprays into both nostrils daily. 12/15/17   Doristine Devoid, PA-C  Fluticasone-Salmeterol (ADVAIR) 250-50 MCG/DOSE AEPB Inhale 1 puff into the lungs 2 (two) times daily. 01/08/17   Tysinger, Camelia Eng, PA-C  Magnesium 100 MG TABS Take 1 tablet (100 mg total) by mouth daily. 07/08/17   Tysinger, Camelia Eng, PA-C  metFORMIN (GLUCOPHAGE) 500 MG tablet TAKE ONE TABLET BY MOUTH ONCE DAILY WITH  BREAKFAST Patient taking differently: Take 500 mg by mouth daily with breakfast.  01/08/17   Tysinger, Camelia Eng, PA-C  methocarbamol (ROBAXIN) 500 MG tablet Take 1 tablet (500 mg total) by mouth at bedtime as needed for muscle spasms. 09/29/17   Tysinger, Camelia Eng, PA-C  oseltamivir (TAMIFLU) 75 MG capsule Take 1 capsule (75 mg total) by mouth 2 (two) times daily. 12/15/17   Doristine Devoid, PA-C  oxyCODONE-acetaminophen (ROXICET) 5-325 MG tablet Take 1 tablet by mouth every 8 (eight) hours as needed for severe pain. 09/29/17   Tysinger, Camelia Eng, PA-C  predniSONE (DELTASONE) 50 MG tablet Take 50 mg by mouth See admin instructions. Takes 50mg  daily for five days prn for asthma attacks. 03/13/17   [provider]  simvastatin (ZOCOR) 10 MG tablet Take 1 tablet (10 mg total) by mouth daily. 01/08/17   Tysinger, Camelia Eng, PA-C  Vitamin D, Ergocalciferol, (DRISDOL) 50000 units CAPS capsule Take 1 capsule (50,000 Units total) by mouth every 7 (seven) days. Patient taking differently: Take  50,000 Units by mouth every Sunday.  01/10/17   Tysinger, Camelia Eng, PA-C    Family History Family History  Problem Relation Age of Onset  . Diabetes Mother   . Hypertension Mother   . Aneurysm Mother   . Stroke Mother   . Cancer Mother        cervical cancer  . Cancer Maternal Aunt        breast  . Cancer Cousin        breast/breast  . Heart disease Neg Hx   . Colon cancer Neg Hx     Social History Social History   Tobacco Use  . Smoking status: Current Every Day Smoker    Packs/day: 0.25    Years: 13.00    Pack years: 3.25    Types: Cigarettes  . Smokeless tobacco: Never Used  Substance Use Topics  . Alcohol use: Yes    Comment: occ  .  Drug use: No     Allergies   Potassium chloride; Pneumococcal vaccines; and Vicodin [hydrocodone-acetaminophen]   Review of Systems Review of Systems  All other systems reviewed and are negative.    Physical Exam Updated Vital Signs BP (!) 147/87 (BP Location: Left Arm)   Pulse 87   Temp 99.3 F (37.4 C) (Oral)   Resp 20   Ht 6' (1.829 m)   Wt 110.2 kg (243 lb)   LMP 06/29/2015 (Exact Date)   SpO2 97%   BMI 32.96 kg/m   Physical Exam  Constitutional: She is oriented to person, place, and time. She appears well-developed and well-nourished. No distress.  HENT:  Head: Normocephalic and atraumatic.  Right Ear: Tympanic membrane, external ear and ear canal normal.  Left Ear: Tympanic membrane, external ear and ear canal normal.  Nose: Mucosal edema and rhinorrhea present. Right sinus exhibits no maxillary sinus tenderness and no frontal sinus tenderness. Left sinus exhibits no maxillary sinus tenderness and no frontal sinus tenderness.  Mouth/Throat: Uvula is midline and mucous membranes are normal. No trismus in the jaw. No uvula swelling. Posterior oropharyngeal erythema present. No oropharyngeal exudate, posterior oropharyngeal edema or tonsillar abscesses. No tonsillar exudate.  Eyes: Conjunctivae are normal. Right  eye exhibits no discharge. Left eye exhibits no discharge. No scleral icterus.  Neck: Normal range of motion.  Cardiovascular: Normal rate, regular rhythm, normal heart sounds and intact distal pulses. Exam reveals no gallop and no friction rub.  No murmur heard. Pulmonary/Chest: Effort normal and breath sounds normal. No stridor. No respiratory distress. She has no decreased breath sounds. She has no wheezes. She has no rhonchi. She has no rales. She exhibits no tenderness.  Musculoskeletal: Normal range of motion.  Neurological: She is alert and oriented to person, place, and time.  Skin: Skin is warm and dry. Capillary refill takes less than 2 seconds. No pallor.  Psychiatric: Her behavior is normal. Judgment and thought content normal.  Nursing note and vitals reviewed.    ED Treatments / Results  Labs (all labs ordered are listed, but only abnormal results are displayed) Labs Reviewed - No data to display  EKG  EKG Interpretation None       Radiology No results found.  Procedures Procedures (including critical care time)  Medications Ordered in ED Medications - No data to display   Initial Impression / Assessment and Plan / ED Course  I have reviewed the triage vital signs and the nursing notes.  Pertinent labs & imaging results that were available during my care of the patient were reviewed by me and considered in my medical decision making (see chart for details).     Patient with symptoms consistent with influenza.  Vitals are stable, low-grade fever.  No signs of dehydration, tolerating PO's.  Lungs are clear. Due to patient's presentation and physical exam a chest x-ray was not ordered bc likely diagnosis of flu. She is not hypoxic of febrile at this time. Low suspicion for pna however dicussed with pt if she develops any worsening sputum production, cp, sob to return to the ed of pcp for chest xray. Discussed the cost versus benefit of Tamiflu treatment with the  patient.  Will start pt on tamiflu given medical hx.  Patient will be discharged with instructions to orally hydrate, rest, and use over-the-counter medications such as anti-inflammatories ibuprofen and Aleve for muscle aches and Tylenol for fever.  Patient will also be given a cough suppressant.    Final Clinical  Impressions(s) / ED Diagnoses   Final diagnoses:  Influenza-like illness    ED Discharge Orders        Ordered    oseltamivir (TAMIFLU) 75 MG capsule  2 times daily     12/15/17 0715    fluticasone (FLONASE) 50 MCG/ACT nasal spray  Daily     12/15/17 0715    benzonatate (TESSALON) 100 MG capsule  Every 8 hours     12/15/17 0715       Doristine Devoid, PA-C 12/15/17 [place], Rafael Hernandez, DO 12/15/17 [phone]

## 2017-12-15 NOTE — ED Notes (Signed)
Bed: [facility] Expected date:  Expected time:  Means of arrival:  Comments: 

## 2017-12-15 NOTE — Discharge Instructions (Signed)
Your symptoms seem consistent with the flu.  Take the Tamiflu as prescribed.  Have given you Tessalon for cough.  Use Flonase for nasal congestion.  Drink plenty of fluids.  May use Mucinex to help loosen the congestion in your chest.  If you develop worsening sputum production, worsening fevers return the ED for evaluation.  Follow with primary care doctor as needed.

## 2017-12-15 NOTE — ED Notes (Signed)
Pt reports being around multiple people: husband, two grandchildren, and two daughters that all were positive for influenza. She c/o generalized body aches, fever/chills, coughing, and emesis.

## 2017-12-21 ENCOUNTER — Emergency Department (HOSPITAL_COMMUNITY): Payer: BLUE CROSS/BLUE SHIELD

## 2017-12-21 ENCOUNTER — Emergency Department (HOSPITAL_COMMUNITY)
Admission: EM | Admit: 2017-12-21 | Discharge: 2017-12-21 | Disposition: A | Discharge status: Home / Self Care | Payer: BLUE CROSS/BLUE SHIELD | Attending: Emergency Medicine | Admitting: Emergency Medicine

## 2017-12-21 ENCOUNTER — Encounter (HOSPITAL_COMMUNITY): Payer: Self-pay | Admitting: Emergency Medicine

## 2017-12-21 DIAGNOSIS — J209 Acute bronchitis, unspecified: Secondary | ICD-10-CM | POA: Diagnosis not present

## 2017-12-21 DIAGNOSIS — F17210 Nicotine dependence, cigarettes, uncomplicated: Secondary | ICD-10-CM | POA: Insufficient documentation

## 2017-12-21 DIAGNOSIS — Z79899 Other long term (current) drug therapy: Secondary | ICD-10-CM | POA: Diagnosis not present

## 2017-12-21 DIAGNOSIS — Z7984 Long term (current) use of oral hypoglycemic drugs: Secondary | ICD-10-CM | POA: Diagnosis not present

## 2017-12-21 DIAGNOSIS — Z7982 Long term (current) use of aspirin: Secondary | ICD-10-CM | POA: Insufficient documentation

## 2017-12-21 DIAGNOSIS — E119 Type 2 diabetes mellitus without complications: Secondary | ICD-10-CM | POA: Diagnosis not present

## 2017-12-21 DIAGNOSIS — J45909 Unspecified asthma, uncomplicated: Secondary | ICD-10-CM | POA: Diagnosis not present

## 2017-12-21 DIAGNOSIS — I10 Essential (primary) hypertension: Secondary | ICD-10-CM | POA: Diagnosis not present

## 2017-12-21 DIAGNOSIS — H1031 Unspecified acute conjunctivitis, right eye: Secondary | ICD-10-CM

## 2017-12-21 DIAGNOSIS — J40 Bronchitis, not specified as acute or chronic: Secondary | ICD-10-CM

## 2017-12-21 DIAGNOSIS — R062 Wheezing: Secondary | ICD-10-CM | POA: Diagnosis present

## 2017-12-21 MED ORDER — TOBRAMYCIN 0.3 % OP SOLN: 2.0000 [drp] | OPHTHALMIC | 0 refills | Status: DC

## 2017-12-21 MED ORDER — AZITHROMYCIN 250 MG PO TABS: 250.0000 mg | ORAL_TABLET | Freq: Every day | ORAL | 0 refills | Status: DC

## 2017-12-21 MED ORDER — ALBUTEROL SULFATE (2.5 MG/3ML) 0.083% IN NEBU
5.0000 mg | INHALATION_SOLUTION | Freq: Once | RESPIRATORY_TRACT | Status: AC
  Administered 2017-12-21: 5 mg via RESPIRATORY_TRACT
  Filled 2017-12-21: qty 6

## 2017-12-21 MED ORDER — PREDNISONE 10 MG PO TABS: 20.0000 mg | ORAL_TABLET | Freq: Every day | ORAL | 0 refills | Status: DC

## 2017-12-21 NOTE — Discharge Instructions (Signed)
Prescription for eyedrops, antibiotic, prednisone.  Unfortunately the cough syrup has hydrocodone in it which you are allergic to.  Recommend over-the-counter cough syrup.  Increase fluids.  Rinse eyes with tap water prior to applying drops.

## 2017-12-21 NOTE — ED Provider Notes (Signed)
[facility] DEPT Provider Note   CSN: [id number] Arrival date & time: 12/21/17  0431     History   Chief Complaint Chief Complaint  Patient presents with  . Cough  . Wheezing  . Eye Drainage    HPI Sydney Williams is a 52 y.o. female.  Persistent cough for 2 weeks with associated wheezing.  Patient was prescribed Tamiflu for which she finished today.  Additionally, she reports drainage and erythema from her right eye.  No fever, sweats, chills.  Severity of illness is mild to moderate.  Non-smoker.      Past Medical History:  Diagnosis Date  . Asthma    2015 hospitaliation for asthma  . Diabetes mellitus    vs impaired fasting glucose  . Family history of breast cancer   . Fibroids 2010  . Genital herpes   . GERD (gastroesophageal reflux disease)   . Hyperlipidemia   . Hypertension   . Impaired fasting blood sugar   . Migraines   . Obesity   . Smoker     Patient Active Problem List   Diagnosis Date Noted  . Low back pain 09/29/2017  . Abnormal myocardial perfusion study   . Depression 06/19/2017  . Atypical chest pain 06/19/2017  . Hypokalemia 06/19/2017  . Hypomagnesemia 06/19/2017  . Vaccine counseling 01/08/2017  . 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 Screening for cervical cancer 01/08/2017  . Routine general medical examination at a health care facility 01/08/2017  . Snoring 09/11/2016  . History of migraine 09/11/2016  . Frequent headaches 09/11/2016  . Muscle cramp 09/11/2016  . Encounter for health maintenance examination in adult 12/04/2015  . Heel pain, bilateral 12/04/2015  . Screening for breast cancer 12/04/2015  . Need for influenza vaccination 12/04/2015  . Moderate persistent asthma 06/01/2015  . Gastroesophageal reflux disease without esophagitis 06/01/2015  . Rhinitis, allergic 06/01/2015  . Genital herpes 11/30/2014  . Smoker 11/30/2014  . Essential hypertension 11/30/2014  .  Hyperlipidemia 11/30/2014  . Impaired fasting blood sugar 11/30/2014  . Asthma 07/02/2012  . Obesity 07/02/2012    Past Surgical History:  Procedure Laterality Date  . COLONOSCOPY  03/13/2016   Dr. Carlean Purl, normal, repeat 2027  . CYST EXCISION     left neck/postauricular region, benign  . LAPAROSCOPIC ABDOMINAL EXPLORATION     removal of ectopic preg  . LEFT HEART CATH AND CORONARY ANGIOGRAPHY N/A 06/23/2017   Procedure: LEFT HEART CATH AND CORONARY ANGIOGRAPHY;  Surgeon: Nelva Bush, MD;  Location: [facility];  Service: Cardiovascular;  Laterality: N/A;  . TUBAL LIGATION      OB History    Gravida Para Term Preterm AB Living   5 1 1   2 3    SAB TAB Ectopic Multiple Live Births   1   1           Home Medications    Prior to Admission medications   Medication Sig Start Date End Date Taking? Authorizing Provider  acyclovir (ZOVIRAX) 200 MG capsule Take 200 mg by mouth 2 (two) times daily.   Yes [provider]  albuterol (PROVENTIL HFA;VENTOLIN HFA) 108 (90 Base) MCG/ACT inhaler Inhale 1-2 puffs into the lungs every 6 (six) hours as needed for wheezing or shortness of breath. 01/08/17  Yes Tysinger, Camelia Eng, PA-C  amLODipine (NORVASC) 10 MG tablet Take 1 tablet (10 mg total) by mouth daily. 03/12/17  Yes Tysinger, Camelia Eng, PA-C  aspirin EC 81 MG tablet Take  1 tablet (81 mg total) by mouth daily. 01/08/17  Yes Tysinger, Camelia Eng, PA-C  benzonatate (TESSALON) 100 MG capsule Take 1 capsule (100 mg total) by mouth every 8 (eight) hours. 12/15/17  Yes Doristine Devoid, PA-C  buPROPion (WELLBUTRIN XL) 150 MG 24 hr tablet TAKE 1 TABLET BY MOUTH ONCE DAILY 11/17/17  Yes Tysinger, Camelia Eng, PA-C  fluticasone (FLONASE) 50 MCG/ACT nasal spray Place 2 sprays into both nostrils daily. Patient taking differently: Place 2 sprays into both nostrils daily as needed for allergies.  12/15/17  Yes Leaphart, Zack Seal, PA-C  Fluticasone-Salmeterol (ADVAIR) 250-50 MCG/DOSE AEPB Inhale  1 puff into the lungs 2 (two) times daily. 01/08/17  Yes Tysinger, Camelia Eng, PA-C  hydrochlorothiazide (MICROZIDE) 12.5 MG capsule Take 12.5 mg by mouth daily.   Yes [provider]  ibuprofen (ADVIL,MOTRIN) 200 MG tablet Take 800 mg by mouth every 6 (six) hours as needed for moderate pain.   Yes [provider]  Magnesium 100 MG TABS Take 1 tablet (100 mg total) by mouth daily. 07/08/17  Yes Tysinger, Camelia Eng, PA-C  metFORMIN (GLUCOPHAGE) 500 MG tablet TAKE ONE TABLET BY MOUTH ONCE DAILY WITH  BREAKFAST Patient taking differently: Take 500 mg by mouth daily with breakfast.  01/08/17  Yes Tysinger, Camelia Eng, PA-C  methocarbamol (ROBAXIN) 500 MG tablet Take 1 tablet (500 mg total) by mouth at bedtime as needed for muscle spasms. 09/29/17  Yes Tysinger, Camelia Eng, PA-C  metoprolol tartrate (LOPRESSOR) 50 MG tablet Take 50 mg by mouth 2 (two) times daily.   Yes [provider]  simvastatin (ZOCOR) 10 MG tablet Take 1 tablet (10 mg total) by mouth daily. 01/08/17  Yes Tysinger, Camelia Eng, PA-C  Vitamin D, Ergocalciferol, (DRISDOL) 50000 units CAPS capsule Take 1 capsule (50,000 Units total) by mouth every 7 (seven) days. Patient taking differently: Take 50,000 Units by mouth every Sunday.  01/10/17  Yes Tysinger, Camelia Eng, PA-C  amLODipine (NORVASC) 5 MG tablet TAKE ONE TABLET BY MOUTH ONCE DAILY Patient not taking: Reported on 12/21/2017 12/01/17   Tysinger, Camelia Eng, PA-C  azithromycin (ZITHROMAX) 250 MG tablet Take 1 tablet (250 mg total) by mouth daily. Take first 2 tablets together, then 1 every day until finished. 12/21/17   Nat Christen, MD  bisoprolol-hydrochlorothiazide [facility]) 10-6.25 MG tablet Take 1 tablet by mouth daily. Patient not taking: Reported on 12/21/2017 09/29/17   Tysinger, Camelia Eng, PA-C  oseltamivir (TAMIFLU) 75 MG capsule Take 1 capsule (75 mg total) by mouth 2 (two) times daily. Patient not taking: Reported on 12/21/2017 12/15/17   Ocie Cornfield T, PA-C    oxyCODONE-acetaminophen (ROXICET) 5-325 MG tablet Take 1 tablet by mouth every 8 (eight) hours as needed for severe pain. Patient not taking: Reported on 12/21/2017 09/29/17   Tysinger, Camelia Eng, PA-C  predniSONE (DELTASONE) 10 MG tablet Take 2 tablets (20 mg total) by mouth daily. 12/21/17   Nat Christen, MD  tobramycin (TOBREX) 0.3 % ophthalmic solution Place 2 drops into the right eye every 4 (four) hours. 12/21/17   Nat Christen, MD    Family History Family History  Problem Relation Age of Onset  . Diabetes Mother   . Hypertension Mother   . Aneurysm Mother   . Stroke Mother   . Cancer Mother        cervical cancer  . Cancer Maternal Aunt        breast  . Cancer Cousin        breast/breast  .  Heart disease Neg Hx   . Colon cancer Neg Hx     Social History Social History   Tobacco Use  . Smoking status: Current Every Day Smoker    Packs/day: 0.25    Years: 13.00    Pack years: 3.25    Types: Cigarettes  . Smokeless tobacco: Never Used  Substance Use Topics  . Alcohol use: Yes    Comment: occ  . Drug use: No     Allergies   Potassium chloride; Pneumococcal vaccines; and Vicodin [hydrocodone-acetaminophen]   Review of Systems Review of Systems  All other systems reviewed and are negative.    Physical Exam Updated Vital Signs BP (!) 133/93 (BP Location: Left Arm)   Pulse 86   Temp 98.7 F (37.1 C) (Oral)   Resp 18   LMP 06/29/2015 (Exact Date)   SpO2 97%   Physical Exam  Constitutional: She is oriented to person, place, and time. She appears well-developed and well-nourished.  HENT:  Head: Normocephalic and atraumatic.  Eyes:  Crusted gray drainage surrounding the right eye.  Conjunctiva erythematous.  Neck: Neck supple.  Cardiovascular: Normal rate and regular rhythm.  Pulmonary/Chest: Effort normal and breath sounds normal.  No obvious wheezing  Abdominal: Soft. Bowel sounds are normal.  Musculoskeletal: Normal range of motion.  Neurological: She  is alert and oriented to person, place, and time.  Skin: Skin is warm and dry.  Psychiatric: She has a normal mood and affect. Her behavior is normal.  Nursing note and vitals reviewed.    ED Treatments / Results  Labs (all labs ordered are listed, but only abnormal results are displayed) Labs Reviewed - No data to display  EKG  EKG Interpretation None       Radiology Dg Chest 2 View  Result Date: 12/21/2017 CLINICAL DATA:  Asthma exacerbation. EXAM: CHEST  2 VIEW COMPARISON:  Chest radiograph October 22, 2017 FINDINGS: Cardiomediastinal silhouette is normal. Calcified aortic arch. No pleural effusions or focal consolidations. Trachea projects midline and there is no pneumothorax. Soft tissue planes and included osseous structures are non-suspicious. Mild degenerative change of the thoracic spine. IMPRESSION: Negative.  No acute cardiopulmonary process. Aortic Atherosclerosis (ICD10-I70.0). Electronically Signed   By: Elon Alas M.D.   On: 12/21/2017 05:51    Procedures Procedures (including critical care time)  Medications Ordered in ED Medications  albuterol (PROVENTIL) (2.5 MG/3ML) 0.083% nebulizer solution 5 mg (5 mg Nebulization Given 12/21/17 0522)     Initial Impression / Assessment and Plan / ED Course  I have reviewed the triage vital signs and the nursing notes.  Pertinent labs & imaging results that were available during my care of the patient were reviewed by me and considered in my medical decision making (see chart for details).     Patient's cough is persisted for 2 weeks.  This could represent mycoplasma.  Additionally, she has an obvious conjunctivitis.  Discharge medications Zithromax, prednisone, tobramycin ophthalmic drops.  Final Clinical Impressions(s) / ED Diagnoses   Final diagnoses:  Acute conjunctivitis of right eye, unspecified acute conjunctivitis type  Bronchitis    ED Discharge Orders        Ordered    azithromycin (ZITHROMAX)  250 MG tablet  Daily     12/21/17 0819    predniSONE (DELTASONE) 10 MG tablet  Daily     12/21/17 0819    tobramycin (TOBREX) 0.3 % ophthalmic solution  Every 4 hours     12/21/17 0819  Nat Christen, MD 12/21/17 [phone]

## 2017-12-21 NOTE — ED Triage Notes (Addendum)
Patient here from home stating "my asthma is acting up". Cough, wheezing. Reports diagnosis of flu, finished Tamiful today. Also reports right eye redness and drainage.

## 2017-12-21 NOTE — ED Notes (Signed)
ED Provider at bedside. 

## 2017-12-29 ENCOUNTER — Other Ambulatory Visit: Payer: Self-pay | Admitting: Medical

## 2017-12-29 DIAGNOSIS — Z1231 Encounter for screening mammogram for malignant neoplasm of breast: Secondary | ICD-10-CM

## 2017-12-31 ENCOUNTER — Other Ambulatory Visit: Payer: Self-pay | Admitting: Medical

## 2018-01-01 NOTE — Telephone Encounter (Signed)
Called and l/m for pt call us back to set up an appt for med check appt.

## 2018-01-08 ENCOUNTER — Telehealth: Payer: Self-pay | Admitting: Medical

## 2018-01-08 NOTE — Telephone Encounter (Signed)
Please call to schedule her a med check follow-up appointment.  It appears that she has had 3 recent emergency department visits for potentially nonemergency issues, and I received a note from her insurance company that she is not taking her preventative inhaler or her cholesterol medication

## 2018-01-08 NOTE — Telephone Encounter (Signed)
Already scheduled for CPE on 01/28/18

## 2018-01-23 ENCOUNTER — Other Ambulatory Visit: Payer: Self-pay | Admitting: Medical

## 2018-01-23 DIAGNOSIS — E785 Hyperlipidemia, unspecified: Secondary | ICD-10-CM

## 2018-01-23 DIAGNOSIS — I10 Essential (primary) hypertension: Secondary | ICD-10-CM

## 2018-01-28 ENCOUNTER — Encounter: Payer: BLUE CROSS/BLUE SHIELD | Admitting: Medical

## 2018-01-30 ENCOUNTER — Other Ambulatory Visit: Payer: Self-pay | Admitting: Medical

## 2018-02-02 ENCOUNTER — Ambulatory Visit (INDEPENDENT_AMBULATORY_CARE_PROVIDER_SITE_OTHER): Payer: BLUE CROSS/BLUE SHIELD | Admitting: Medical

## 2018-02-02 ENCOUNTER — Encounter: Payer: Self-pay | Admitting: Medical

## 2018-02-02 ENCOUNTER — Other Ambulatory Visit (HOSPITAL_COMMUNITY)
Admission: RE | Admit: 2018-02-02 | Discharge: 2018-02-02 | Disposition: A | Discharge status: Home / Self Care | Payer: BLUE CROSS/BLUE SHIELD | Source: Ambulatory Visit | Attending: Medical | Admitting: Medical

## 2018-02-02 VITALS — BP 132/88 | HR 76 | Ht 69.7 in | Wt 241.4 lb

## 2018-02-02 DIAGNOSIS — T50A95D Adverse effect of other bacterial vaccines, subsequent encounter: Secondary | ICD-10-CM

## 2018-02-02 DIAGNOSIS — E782 Mixed hyperlipidemia: Secondary | ICD-10-CM | POA: Diagnosis not present

## 2018-02-02 DIAGNOSIS — I10 Essential (primary) hypertension: Secondary | ICD-10-CM | POA: Diagnosis not present

## 2018-02-02 DIAGNOSIS — Z0000 Encounter for general adult medical examination without abnormal findings: Secondary | ICD-10-CM | POA: Diagnosis present

## 2018-02-02 DIAGNOSIS — Z0101 Encounter for examination of eyes and vision with abnormal findings: Secondary | ICD-10-CM

## 2018-02-02 DIAGNOSIS — Z1159 Encounter for screening for other viral diseases: Secondary | ICD-10-CM | POA: Insufficient documentation

## 2018-02-02 DIAGNOSIS — K219 Gastro-esophageal reflux disease without esophagitis: Secondary | ICD-10-CM | POA: Diagnosis not present

## 2018-02-02 DIAGNOSIS — R7301 Impaired fasting glucose: Secondary | ICD-10-CM | POA: Diagnosis not present

## 2018-02-02 DIAGNOSIS — T887XXD Unspecified adverse effect of drug or medicament, subsequent encounter: Secondary | ICD-10-CM

## 2018-02-02 DIAGNOSIS — F17200 Nicotine dependence, unspecified, uncomplicated: Secondary | ICD-10-CM

## 2018-02-02 DIAGNOSIS — E669 Obesity, unspecified: Secondary | ICD-10-CM

## 2018-02-02 DIAGNOSIS — Z124 Encounter for screening for malignant neoplasm of cervix: Secondary | ICD-10-CM

## 2018-02-02 DIAGNOSIS — E559 Vitamin D deficiency, unspecified: Secondary | ICD-10-CM

## 2018-02-02 DIAGNOSIS — Z7189 Other specified counseling: Secondary | ICD-10-CM

## 2018-02-02 DIAGNOSIS — Z8669 Personal history of other diseases of the nervous system and sense organs: Secondary | ICD-10-CM

## 2018-02-02 DIAGNOSIS — Z803 Family history of malignant neoplasm of breast: Secondary | ICD-10-CM

## 2018-02-02 DIAGNOSIS — J309 Allergic rhinitis, unspecified: Secondary | ICD-10-CM

## 2018-02-02 DIAGNOSIS — Z7185 Encounter for immunization safety counseling: Secondary | ICD-10-CM

## 2018-02-02 DIAGNOSIS — Z1239 Encounter for other screening for malignant neoplasm of breast: Secondary | ICD-10-CM

## 2018-02-02 DIAGNOSIS — J4540 Moderate persistent asthma, uncomplicated: Secondary | ICD-10-CM

## 2018-02-02 DIAGNOSIS — Z8049 Family history of malignant neoplasm of other genital organs: Secondary | ICD-10-CM | POA: Insufficient documentation

## 2018-02-02 DIAGNOSIS — Z139 Encounter for screening, unspecified: Secondary | ICD-10-CM

## 2018-02-02 DIAGNOSIS — Z1231 Encounter for screening mammogram for malignant neoplasm of breast: Secondary | ICD-10-CM | POA: Diagnosis not present

## 2018-02-02 NOTE — Patient Instructions (Addendum)
Thanks for trusting [place] with your health care and for coming in for a physical today.  Below are some general recommendations I have for you:  Yearly screenings See your eye doctor yearly for routine vision care. See your dentist yearly for routine dental care including hygiene visits twice yearly. See me here yearly for a routine physical and preventative care visit   Cancer screening Colon cancer screening:   You are up to date on colonoscopy screening.   You should still have stool card screening to check for blood in the stool every 3-5 years.  Please use the 3 cards given today to smear some poop/fecal matter on each card separately from 3 separate days.  Please mail them back so we can check them for blood  Breast cancer screening -  Please call to schedule your mammogram at your convenience.  [facility]   [phone]          [phone] [address] [place], [address] [place], [place] [address]        [place], [place] [address]  Specific Concerns today:  Shingles vaccine:  I recommend you have a shingles vaccine to help prevent shingles or herpes zoster outbreak.   Please call your insurer to inquire about coverage for the Shingrix vaccine given in 2 doses.   Some insurers cover this vaccine after age 66, some cover this after age 23.  If your insurer covers this, then call to schedule appointment to have this vaccine here.   Please follow up yearly for a physical.    I have included other useful information below for your review.  Preventative Care for Adults - Female      MAINTAIN REGULAR HEALTH EXAMS:  A routine yearly physical is a good way to check in with your primary care provider about your health and preventive screening. It is also an opportunity to share updates about your health and any concerns you have, and receive a thorough all-over exam.   Most health insurance companies  pay for at least some preventative services.  Check with your health plan for specific coverages.  WHAT PREVENTATIVE SERVICES DO WOMEN NEED?  Adult women should have their weight and blood pressure checked regularly.   Women age 81 and older should have their cholesterol levels checked regularly.  Women should be screened for cervical cancer with a Pap smear and pelvic exam beginning at either age 20, or 3 years after they become sexually activity.    Breast cancer screening generally begins at age 25 with a mammogram and breast exam by your primary care provider.    Beginning at age 36 and continuing to age 22, women should be screened for colorectal cancer.  Certain people may need continued testing until age 9.  Updating vaccinations is part of preventative care.  Vaccinations help protect against diseases such as the flu.  Osteoporosis is a disease in which the bones lose minerals and strength as we age. Women ages 30 and over should discuss this with their caregivers, as should women after menopause who have other risk factors.  Lab tests are generally done as part of preventative care to screen for anemia and blood disorders, to screen for problems with the kidneys and liver, to screen for bladder problems, to check blood sugar, and to check your cholesterol level.  Preventative services generally include counseling about  diet, exercise, avoiding tobacco, drugs, excessive alcohol consumption, and sexually transmitted infections.    GENERAL RECOMMENDATIONS FOR GOOD HEALTH:  Healthy diet:  Eat a variety of foods, including fruit, vegetables, animal or vegetable protein, such as meat, fish, chicken, and eggs, or beans, lentils, tofu, and grains, such as rice.  Drink plenty of water daily.  Decrease saturated fat in the diet, avoid lots of red meat, processed foods, sweets, fast foods, and fried foods.  Exercise:  Aerobic exercise helps maintain good heart health. At least 30-40  minutes of moderate-intensity exercise is recommended. For example, a brisk walk that increases your heart rate and breathing. This should be done on most days of the week.   Find a type of exercise or a variety of exercises that you enjoy so that it becomes a part of your daily life.  Examples are running, walking, swimming, water aerobics, and biking.  For motivation and support, explore group exercise such as aerobic class, spin class, Zumba, Yoga,or  martial arts, etc.    Set exercise goals for yourself, such as a certain weight goal, walk or run in a race such as a 5k walk/run.  Speak to your primary care provider about exercise goals.  Disease prevention:  If you smoke or chew tobacco, find out from your caregiver how to quit. It can literally save your life, no matter how long you have been a tobacco user. If you do not use tobacco, never begin.   Maintain a healthy diet and normal weight. Increased weight leads to problems with blood pressure and diabetes.   The Body Mass Index or BMI is a way of measuring how much of your body is fat. Having a BMI above 27 increases the risk of heart disease, diabetes, hypertension, stroke and other problems related to obesity. Your caregiver can help determine your BMI and based on it develop an exercise and dietary program to help you achieve or maintain this important measurement at a healthful level.  High blood pressure causes heart and blood vessel problems.  Persistent high blood pressure should be treated with medicine if weight loss and exercise do not work.   Fat and cholesterol leaves deposits in your arteries that can block them. This causes heart disease and vessel disease elsewhere in your body.  If your cholesterol is found to be high, or if you have heart disease or certain other medical conditions, then you may need to have your cholesterol monitored frequently and be treated with medication.   Ask if you should have a cardiac stress test  if your history suggests this. A stress test is a test done on a treadmill that looks for heart disease. This test can find disease prior to there being a problem.  Menopause can be associated with physical symptoms and risks. Hormone replacement therapy is available to decrease these. You should talk to your caregiver about whether starting or continuing to take hormones is right for you.   Osteoporosis is a disease in which the bones lose minerals and strength as we age. This can result in serious bone fractures. Risk of osteoporosis can be identified using a bone density scan. Women ages 70 and over should discuss this with their caregivers, as should women after menopause who have other risk factors. Ask your caregiver whether you should be taking a calcium supplement and Vitamin D, to reduce the rate of osteoporosis.   Avoid drinking alcohol in excess (more than two drinks per day).  Avoid use  of street drugs. Do not share needles with anyone. Ask for professional help if you need assistance or instructions on stopping the use of alcohol, cigarettes, and/or drugs.  Brush your teeth twice a day with fluoride toothpaste, and floss once a day. Good oral hygiene prevents tooth decay and gum disease. The problems can be painful, unattractive, and can cause other health problems. Visit your dentist for a routine oral and dental check up and preventive care every 6-12 months.   Look at your skin regularly.  Use a mirror to look at your back. Notify your caregivers of changes in moles, especially if there are changes in shapes, colors, a size larger than a pencil eraser, an irregular border, or development of new moles.  Safety:  Use seatbelts 100% of the time, whether driving or as a passenger.  Use safety devices such as hearing protection if you work in environments with loud noise or significant background noise.  Use safety glasses when doing any work that could send debris in to the eyes.  Use a  helmet if you ride a bike or motorcycle.  Use appropriate safety gear for contact sports.  Talk to your caregiver about gun safety.  Use sunscreen with a SPF (or skin protection factor) of 15 or greater.  Lighter skinned people are at a greater risk of skin cancer. Don't forget to also wear sunglasses in order to protect your eyes from too much damaging sunlight. Damaging sunlight can accelerate cataract formation.   Practice safe sex. Use condoms. Condoms are used for birth control and to help reduce the spread of sexually transmitted infections (or STIs).  Some of the STIs are gonorrhea (the clap), chlamydia, syphilis, trichomonas, herpes, HPV (human papilloma virus) and HIV (human immunodeficiency virus) which causes AIDS. The herpes, HIV and HPV are viral illnesses that have no cure. These can result in disability, cancer and death.   Keep carbon monoxide and smoke detectors in your home functioning at all times. Change the batteries every 6 months or use a model that plugs into the wall.   Vaccinations:  Stay up to date with your tetanus shots and other required immunizations. You should have a booster for tetanus every 10 years. Be sure to get your flu shot every year, since 5%-20% of the [place] population comes down with the flu. The flu vaccine changes each year, so being vaccinated once is not enough. Get your shot in the fall, before the flu season peaks.   Other vaccines to consider:  Human Papilloma Virus or HPV causes cancer of the cervix, and other infections that can be transmitted from person to person. There is a vaccine for HPV, and females should get immunized between the ages of 4 and 54. It requires a series of 3 shots.   Pneumococcal vaccine to protect against certain types of pneumonia.  This is normally recommended for adults age 89 or older.  However, adults younger than 53 years old with certain underlying conditions such as diabetes, heart or lung disease should also receive  the vaccine.  Shingles vaccine to protect against Varicella Zoster if you are older than age 35, or younger than 53 years old with certain underlying illness.  If you have not had the Shingrix vaccine, please call your insurer to inquire about coverage for the Shingrix vaccine given in 2 doses.   Some insurers cover this vaccine after age 41, some cover this after age 30.  If your insurer covers this, then call to  schedule appointment to have this vaccine here  Hepatitis A vaccine to protect against a form of infection of the liver by a virus acquired from food.  Hepatitis B vaccine to protect against a form of infection of the liver by a virus acquired from blood or body fluids, particularly if you work in health care.  If you plan to travel internationally, check with your local health department for specific vaccination recommendations.  Cancer Screening:  Breast cancer screening is essential to preventive care for women. All women age 22 and older should perform a breast self-exam every month. At age 20 and older, women should have their caregiver complete a breast exam each year. Women at ages 64 and older should have a mammogram (x-ray film) of the breasts. Your caregiver can discuss how often you need mammograms.    Cervical cancer screening includes taking a Pap smear (sample of cells examined under a microscope) from the cervix (end of the uterus). It also includes testing for HPV (Human Papilloma Virus, which can cause cervical cancer). Screening and a pelvic exam should begin at age 3, or 3 years after a woman becomes sexually active. Screening should occur every year, with a Pap smear but no HPV testing, up to age 8. After age 74, you should have a Pap smear every 3 years with HPV testing, if no HPV was found previously.   Most routine colon cancer screening begins at the age of 62. On a yearly basis, doctors may provide special easy to use take-home tests to check for hidden blood in  the stool. Sigmoidoscopy or colonoscopy can detect the earliest forms of colon cancer and is life saving. These tests use a small camera at the end of a tube to directly examine the colon. Speak to your caregiver about this at age 10, when routine screening begins (and is repeated every 5 years unless early forms of pre-cancerous polyps or small growths are found).

## 2018-02-02 NOTE — Progress Notes (Addendum)
Subjective:   HPI  Sydney Williams is a 52 y.o. female who presents for a complete physical.   Medical team:  Carlena Hurl, PA-C here for primary care Dentist, eye doctor Dr. Carlean Purl, GI  Concerns: Asthma - doing fine, on Advair, 1 puff BID, albuterol occasionally  Impaired glucose - taking Metformin 500mg  once daily  HTN - compliant with Metoprolol 50mg  BID, Hydrochlorothiazide 12.5mg  daily, Amlodipine 10mg  daily  Hyperlipidemia - compliant with Zocor 10mg  daily  Vit D deficiency - taking Vit D 50000 weekly  Magnesiium - not taking magnesium, but levels have been borderline in the past  Exercise - lots of walking, aerobics with husband  Smoking, 4 cig /day  Genital herpes - compliant with Acyclovir BID  Last menstrual period >  3 year ago.  No vaginal c/o, no bleeding, no discharge.    Reviewed their medical, surgical, family, social, medication, and allergy history and updated chart as appropriate.  Past Medical History:  Diagnosis Date  . Asthma    2015 hospitaliation for asthma  . Diabetes mellitus    vs impaired fasting glucose  . Family history of breast cancer   . Fibroids 2010  . Genital herpes   . GERD (gastroesophageal reflux disease)   . Hyperlipidemia   . Hypertension   . Impaired fasting blood sugar   . Migraines   . Obesity   . Smoker     Past Surgical History:  Procedure Laterality Date  . COLONOSCOPY  03/13/2016   Dr. Carlean Purl, normal, repeat 2027  . CYST EXCISION     left neck/postauricular region, benign  . LAPAROSCOPIC ABDOMINAL EXPLORATION     removal of ectopic preg  . LEFT HEART CATH AND CORONARY ANGIOGRAPHY N/A 06/23/2017   Procedure: LEFT HEART CATH AND CORONARY ANGIOGRAPHY;  Surgeon: Nelva Bush, MD;  Location: [facility];  Service: Cardiovascular;  Laterality: N/A;  . TUBAL LIGATION      Social History   Socioeconomic History  . Marital status: Married    Spouse name: Not on file  . Number of children: Not on  file  . Years of education: Not on file  . Highest education level: Not on file  Occupational History  . Not on file  Social Needs  . Financial resource strain: Not on file  . Food insecurity:    Worry: Not on file    Inability: Not on file  . Transportation needs:    Medical: Not on file    Non-medical: Not on file  Tobacco Use  . Smoking status: Current Every Day Smoker    Packs/day: 0.25    Years: 14.00    Pack years: 3.50    Types: Cigarettes  . Smokeless tobacco: Never Used  Substance and Sexual Activity  . Alcohol use: Yes    Comment: occ  . Drug use: No  . Sexual activity: Yes    Birth control/protection: Surgical  Lifestyle  . Physical activity:    Days per week: Not on file    Minutes per session: Not on file  . Stress: Not on file  Relationships  . Social connections:    Talks on phone: Not on file    Gets together: Not on file    Attends religious service: Not on file    Active member of club or organization: Not on file    Attends meetings of clubs or organizations: Not on file    Relationship status: Not on file  . Intimate partner violence:  Fear of current or ex partner: Not on file    Emotionally abused: Not on file    Physically abused: Not on file    Forced sexual activity: Not on file  Other Topics Concern  . Not on file  Social History Narrative   Married, and her granddaughter lives with her, works as Quarry manager at [employer], exercise - activity at work, walking.  12/2016    Family History  Problem Relation Age of Onset  . Diabetes Mother   . Hypertension Mother   . Aneurysm Mother   . Stroke Mother   . Cancer Mother        cervical cancer  . Cancer Maternal Aunt        breast  . Cancer Cousin        breast/breast  . Heart disease Neg Hx   . Colon cancer Neg Hx      Current Outpatient Medications:  .  acyclovir (ZOVIRAX) 200 MG capsule, Take 200 mg by mouth 2 (two) times daily., Disp: , Rfl:  .  albuterol (PROVENTIL HFA;VENTOLIN  HFA) 108 (90 Base) MCG/ACT inhaler, Inhale 1-2 puffs into the lungs every 6 (six) hours as needed for wheezing or shortness of breath., Disp: 1 Inhaler, Rfl: 0 .  amLODipine (NORVASC) 10 MG tablet, Take 1 tablet (10 mg total) by mouth daily., Disp: 90 tablet, Rfl: 3 .  buPROPion (WELLBUTRIN XL) 150 MG 24 hr tablet, TAKE 1 TABLET BY MOUTH ONCE DAILY, Disp: 30 tablet, Rfl: 2 .  EQ ASPIRIN ADULT LOW DOSE 81 MG EC tablet, TAKE 1 TABLET BY MOUTH ONCE DAILY, Disp: 90 tablet, Rfl: 0 .  Fluticasone-Salmeterol (ADVAIR) 250-50 MCG/DOSE AEPB, Inhale 1 puff into the lungs 2 (two) times daily., Disp: 60 each, Rfl: 11 .  hydrochlorothiazide (MICROZIDE) 12.5 MG capsule, Take 12.5 mg by mouth daily., Disp: , Rfl:  .  metFORMIN (GLUCOPHAGE) 500 MG tablet, TAKE ONE TABLET BY MOUTH ONCE DAILY WITH  BREAKFAST (Patient taking differently: Take 500 mg by mouth daily with breakfast. ), Disp: 90 tablet, Rfl: 3 .  methocarbamol (ROBAXIN) 500 MG tablet, Take 1 tablet (500 mg total) by mouth at bedtime as needed for muscle spasms., Disp: 20 tablet, Rfl: 0 .  metoprolol tartrate (LOPRESSOR) 50 MG tablet, Take 50 mg by mouth 2 (two) times daily., Disp: , Rfl:  .  simvastatin (ZOCOR) 10 MG tablet, TAKE 1 TABLET BY MOUTH ONCE DAILY, Disp: 90 tablet, Rfl: 0 .  Vitamin D, Ergocalciferol, (DRISDOL) 50000 units CAPS capsule, Take 1 capsule (50,000 Units total) by mouth every 7 (seven) days. (Patient taking differently: Take 50,000 Units by mouth every Sunday. ), Disp: 12 capsule, Rfl: 3 .  amLODipine (NORVASC) 5 MG tablet, TAKE ONE TABLET BY MOUTH ONCE DAILY (Patient not taking: Reported on 12/21/2017), Disp: 90 tablet, Rfl: 0 .  azithromycin (ZITHROMAX) 250 MG tablet, Take 1 tablet (250 mg total) by mouth daily. Take first 2 tablets together, then 1 every day until finished. (Patient not taking: Reported on 02/02/2018), Disp: 6 tablet, Rfl: 0 .  benzonatate (TESSALON) 100 MG capsule, Take 1 capsule (100 mg total) by mouth every 8 (eight)  hours. (Patient not taking: Reported on 02/02/2018), Disp: 21 capsule, Rfl: 0 .  bisoprolol-hydrochlorothiazide (ZIAC) 10-6.25 MG tablet, Take 1 tablet by mouth daily. (Patient not taking: Reported on 12/21/2017), Disp: 90 tablet, Rfl: 1 .  fluticasone (FLONASE) 50 MCG/ACT nasal spray, Place 2 sprays into both nostrils daily. (Patient not taking: Reported on 02/02/2018), Disp:  16 g, Rfl: 12 .  ibuprofen (ADVIL,MOTRIN) 200 MG tablet, Take 800 mg by mouth every 6 (six) hours as needed for moderate pain., Disp: , Rfl:  .  Magnesium 100 MG TABS, Take 1 tablet (100 mg total) by mouth daily. (Patient not taking: Reported on 02/02/2018), Disp: 30 tablet, Rfl: 1 .  oseltamivir (TAMIFLU) 75 MG capsule, Take 1 capsule (75 mg total) by mouth 2 (two) times daily. (Patient not taking: Reported on 12/21/2017), Disp: 10 capsule, Rfl: 0 .  oxyCODONE-acetaminophen (ROXICET) 5-325 MG tablet, Take 1 tablet by mouth every 8 (eight) hours as needed for severe pain. (Patient not taking: Reported on 12/21/2017), Disp: 15 tablet, Rfl: 0 .  predniSONE (DELTASONE) 10 MG tablet, Take 2 tablets (20 mg total) by mouth daily. (Patient not taking: Reported on 02/02/2018), Disp: 15 tablet, Rfl: 0 .  tobramycin (TOBREX) 0.3 % ophthalmic solution, Place 2 drops into the right eye every 4 (four) hours. (Patient not taking: Reported on 02/02/2018), Disp: 5 mL, Rfl: 0  Allergies  Allergen Reactions  . Potassium Chloride Shortness Of Breath and Swelling    Tolerates IV KCl, reaction only to PO product  . Pneumococcal Vaccines Swelling and Other (See Comments)    Reaction:  Swelling at injection site  . Vicodin [Hydrocodone-Acetaminophen] Itching and Rash    Review of Systems Constitutional: -fever, -chills, -sweats,- weight change, -decreased appetite, -fatigue Allergy: -sneezing, -itching, -congestion Dermatology: -changing moles, --rash, -lumps ENT: -runny nose, -ear pain, -sore throat, -hoarseness, -sinus pain, -teeth pain, - ringing in  ears, -hearing loss, -nosebleeds Cardiology: -chest pain, -palpitations, -swelling, -difficulty breathing when lying flat, -waking up short of breath Respiratory: -cough, -shortness of breath, -difficulty breathing with exercise or exertion, -wheezing, -coughing up blood Gastroenterology: - abdominal pain, -nausea, -vomiting, -diarrhea, -constipation, -blood in stool, -changes in bowel movement, -difficulty swallowing or eating Hematology: -bleeding, -bruising  Musculoskeletal: -joint aches, -muscle aches, -joint swelling, -back pain, -neck pain, -cramping, -changes in gait Ophthalmology: denies vision changes, eye redness, itching, discharge Urology: -burning with urination, -difficulty urinating, -blood in urine, -urinary frequency, -urgency, -incontinence Neurology: -headache, -weakness, -tingling, -numbness, -memory loss, -falls, -dizziness Psychology: -depressed mood, -agitation, -sleep problems      Objective:   Physical Exam  BP 132/88 (BP Location: Right Arm, Patient Position: Sitting, Cuff Size: Normal)   Pulse 76   Ht 5' 9.7" (1.77 m)   Wt 241 lb 6.4 oz (109.5 kg)   LMP 06/29/2015 (Exact Date)   SpO2 97%   BMI 34.94 kg/m   Wt Readings from Last 3 Encounters:  02/02/18 241 lb 6.4 oz (109.5 kg)  12/15/17 243 lb (110.2 kg)  09/29/17 245 lb (111.1 kg)   BP Readings from Last 3 Encounters:  02/02/18 132/88  12/21/17 (!) 133/93  12/15/17 (!) 147/87   General appearance: alert, no distress, WD/WN, obese AA female Skin:  Tattoo right upper back "lady D", tattoo right upper chest, few scattered benign-appearing macules HEENT: normocephalic, conjunctiva/corneas normal, sclerae anicteric, PERRLA, EOMi, nares patent, no discharge or erythema, pharynx normal Oral cavity: MMM, tongue normal, teeth in normal repair, missing some molars Neck: supple, no lymphadenopathy, no thyromegaly, no masses, normal ROM , no bruits Chest: non tender, normal shape and expansion Heart: RRR,  normal S1, S2, no murmurs Lungs: CTA bilaterally, no wheezes, rhonchi, or rales Abdomen: +bs, soft, non tender, non distended, no masses, no hepatomegaly, no splenomegaly, no bruits Back: non tender, normal ROM, no scoliosis Musculoskeletal: upper extremities non tender, no obvious deformity, normal ROM  throughout, lower extremities non tender, no obvious deformity, normal ROM throughout Extremities: no edema, no cyanosis, no clubbing Pulses: 2+ symmetric, upper and lower extremities, normal cap refill Neurological: alert, oriented x 3, CN2-12 intact, strength normal upper extremities and lower extremities, sensation normal throughout, DTRs 2+ throughout, no cerebellar signs, gait normal Psychiatric: normal affect, behavior normal, pleasant  Breast: nontender, no masses or lumps, no skin changes, no nipple discharge or inversion, no axillary lymphadenopathy, tattoo right upper breast Gyn: Normal external genitalia without lesions, vagina with normal mucosa, cervix with whitish and grayish coloration, no cervical motion tenderness, no abnormal vaginal discharge.  Uterus and adnexa not enlarged, otherwise nontender, no masses.   Exam chaperoned by nurse. Rectal: anus normal appearing   Assessment and Plan :    Encounter Diagnoses  Name Primary?  . Encounter for health maintenance examination in adult Yes  . Moderate persistent asthma without complication   . Essential hypertension   . Allergic rhinitis, unspecified seasonality, unspecified trigger   . Gastroesophageal reflux disease without esophagitis   . Hypomagnesemia   . Obesity with serious comorbidity, unspecified classification, unspecified obesity type   . Smoker   . Mixed hyperlipidemia   . Impaired fasting blood sugar   . Screening for breast cancer   . History of migraine   . Vaccine counseling   . 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, subsequent encounter   . Screening for cervical cancer   . Family history of breast  cancer   . Family history of cervical cancer   . Vitamin D deficiency   . Need for hepatitis C screening test   . Screening for condition   . Vision exam with abnormal findings    Physical exam - discussed healthy lifestyle, diet, exercise, preventative care, vaccinations, and addressed their concerns.    See your eye doctor yearly for routine vision care. See your dentist yearly for routine dental care including hygiene visits twice yearly.  Reviewed 2017 colonoscopy report Advised yearly mammogram Pap smear sent today.  She wants yearly pap given mother's history  Chronic issues: advised smoking cessation C/t current medications  Vaccines: Advised yearly flu shot  Shingles vaccine:  I recommend you have a shingles vaccine to help prevent shingles or herpes zoster outbreak.   Please call your insurer to inquire about coverage for the Shingrix vaccine given in 2 doses.   Some insurers cover this vaccine after age 54, some cover this after age 42.  If your insurer covers this, then call to schedule appointment to have this vaccine here.  Hep B titer today  Follow-up pending labs  Parthenia was seen today for annual exam.  Diagnoses and all orders for this visit:  Encounter for health maintenance examination in adult -     Cancel: Comprehensive metabolic panel -     CBC -     Cancel: Lipid panel -     Hemoglobin A1c -     HM DIABETES EYE EXAM -     HM DIABETES FOOT EXAM -     VITAMIN D 25 Hydroxy (Vit-D Deficiency, Fractures) -     Cytology - PAP -     Hepatitis C antibody -     Hepatitis B surface antibody  Moderate persistent asthma without complication  Essential hypertension  Allergic rhinitis, unspecified seasonality, unspecified trigger  Gastroesophageal reflux disease without esophagitis  Hypomagnesemia  Obesity with serious comorbidity, unspecified classification, unspecified obesity type  Smoker  Mixed hyperlipidemia -     Cancel:  Lipid panel  Impaired  fasting blood sugar -     Hemoglobin A1c -     HM DIABETES EYE EXAM -     HM DIABETES FOOT EXAM  Screening for breast cancer  History of migraine  Vaccine counseling  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, subsequent encounter  Screening for cervical cancer -     Cytology - PAP  Family history of breast cancer  Family history of cervical cancer  Vitamin D deficiency -     VITAMIN D 25 Hydroxy (Vit-D Deficiency, Fractures)  Need for hepatitis C screening test -     Hepatitis C antibody -     Hepatitis B surface antibody  Screening for condition -     Hepatitis B surface antibody  Vision exam with abnormal findings

## 2018-02-03 ENCOUNTER — Other Ambulatory Visit: Payer: Self-pay | Admitting: Medical

## 2018-02-03 ENCOUNTER — Telehealth: Payer: Self-pay

## 2018-02-03 DIAGNOSIS — J4540 Moderate persistent asthma, uncomplicated: Secondary | ICD-10-CM

## 2018-02-03 DIAGNOSIS — R7301 Impaired fasting glucose: Secondary | ICD-10-CM

## 2018-02-03 DIAGNOSIS — E785 Hyperlipidemia, unspecified: Secondary | ICD-10-CM

## 2018-02-03 DIAGNOSIS — I10 Essential (primary) hypertension: Secondary | ICD-10-CM

## 2018-02-03 LAB — HEPATITIS B SURFACE ANTIBODY, QUANTITATIVE: Hepatitis B Surf Ab Quant: <3.1 m[IU]/mL — ABNORMAL LOW (ref >9.9)

## 2018-02-03 LAB — CBC
Hematocrit: 39.7 % (ref 34.0–46.6)
Hemoglobin: 13.4 g/dL (ref 11.1–15.9)
MCH: 33 pg (ref 26.6–33.0)
MCHC: 33.8 g/dL (ref 31.5–35.7)
MCV: 98 fL — ABNORMAL HIGH (ref 79–97)
Platelets: 225 10*3/uL (ref 150–379)
RBC: 4.06 x10E6/uL (ref 3.77–5.28)
RDW: 15.7 % — ABNORMAL HIGH (ref 12.3–15.4)
WBC: 4.4 10*3/uL (ref 3.4–10.8)

## 2018-02-03 LAB — HEPATITIS C ANTIBODY: Hep C Virus Ab: <0.1 s/co ratio (ref 0.0–0.9)

## 2018-02-03 LAB — HEMOGLOBIN A1C
Est. average glucose Bld gHb Est-mCnc: 126 mg/dL
Hgb A1c MFr Bld: 6 % — ABNORMAL HIGH (ref 4.8–5.6)

## 2018-02-03 LAB — VITAMIN D 25 HYDROXY (VIT D DEFICIENCY, FRACTURES): Vit D, 25-Hydroxy: 41.8 ng/mL (ref 30.0–100.0)

## 2018-02-03 MED ORDER — AMLODIPINE BESYLATE 5 MG PO TABS: 5.0000 mg | ORAL_TABLET | Freq: Every day | ORAL | 1 refills | Status: DC

## 2018-02-03 MED ORDER — METOPROLOL TARTRATE 50 MG PO TABS: 50.0000 mg | ORAL_TABLET | Freq: Two times a day (BID) | ORAL | 3 refills | Status: DC

## 2018-02-03 MED ORDER — ASPIRIN 81 MG PO TBEC: 81.0000 mg | DELAYED_RELEASE_TABLET | Freq: Every day | ORAL | 3 refills | Status: AC

## 2018-02-03 MED ORDER — ALBUTEROL SULFATE HFA 108 (90 BASE) MCG/ACT IN AERS: 1.0000 | INHALATION_SPRAY | Freq: Four times a day (QID) | RESPIRATORY_TRACT | 1 refills | Status: DC | PRN

## 2018-02-03 MED ORDER — VITAMIN D 1000 UNITS PO TABS: 1000.0000 [IU] | ORAL_TABLET | Freq: Every day | ORAL | 3 refills | Status: DC

## 2018-02-03 MED ORDER — HYDROCHLOROTHIAZIDE 12.5 MG PO CAPS: 12.5000 mg | ORAL_CAPSULE | Freq: Every day | ORAL | 3 refills | Status: DC

## 2018-02-03 MED ORDER — SIMVASTATIN 10 MG PO TABS: 10.0000 mg | ORAL_TABLET | Freq: Every day | ORAL | 3 refills | Status: DC

## 2018-02-03 MED ORDER — METFORMIN HCL 500 MG PO TABS: 500.0000 mg | ORAL_TABLET | Freq: Every day | ORAL | 3 refills | Status: DC

## 2018-02-03 MED ORDER — FLUTICASONE-SALMETEROL 250-50 MCG/DOSE IN AEPB: 1.0000 | INHALATION_SPRAY | Freq: Two times a day (BID) | RESPIRATORY_TRACT | 11 refills | Status: DC

## 2018-02-03 NOTE — Telephone Encounter (Signed)
Called patient, LVM to call back to discuss lab work. Left call back number.  

## 2018-02-03 NOTE — Telephone Encounter (Signed)
-----   Message from Carlena Hurl, PA-C sent at 02/03/2018 11:10 AM EDT ----- Labs show that she is NOT immune to hepatitis B.   Her diabetes marker was 6%, still borderline.  Pap still pending.  Rest of labs ok.  Recommendations: 1- I changed her Vit D to 1000 IU daily instead of the weekly 2- c/t rest of medications as usual 3- I recommend she get the Hepatitis B vaccine series, 3 shots given at time zero, 2 months and 6 months.   She can do this here or through her employer possibly free.  She should check on this. 4- I recommend she continue exercising regularly, eat a healthy low fat diet.   I recommend working towards weight loss or fitness goals.  F/u in 1 year for physical, consider recheck in 6 months on monitoring sugar and blood pressure if not making progress towards fitness goals.

## 2018-02-06 LAB — CYTOLOGY - PAP
Diagnosis: UNDETERMINED — AB
HPV: NOT DETECTED

## 2018-02-09 NOTE — Progress Notes (Signed)
Patient was advised of providers note. Patient stated she will call to schedule her yearly physical.

## 2018-02-11 ENCOUNTER — Ambulatory Visit
Admission: RE | Admit: 2018-02-11 | Discharge: 2018-02-11 | Disposition: A | Discharge status: Home / Self Care | Payer: BLUE CROSS/BLUE SHIELD | Source: Ambulatory Visit | Attending: Medical | Admitting: Medical

## 2018-02-11 DIAGNOSIS — Z1231 Encounter for screening mammogram for malignant neoplasm of breast: Secondary | ICD-10-CM

## 2018-03-08 ENCOUNTER — Other Ambulatory Visit: Payer: Self-pay | Admitting: Medical

## 2018-03-09 NOTE — Telephone Encounter (Signed)
Please advise if pt wellbutrin can be filled. Also pt has been on Vitamin d please put in furture labs for a recheck. Thanks kh

## 2018-04-05 ENCOUNTER — Other Ambulatory Visit: Payer: Self-pay | Admitting: Medical

## 2018-04-05 DIAGNOSIS — A6000 Herpesviral infection of urogenital system, unspecified: Secondary | ICD-10-CM

## 2018-04-06 NOTE — Telephone Encounter (Signed)
Is this refill appropriate?  

## 2018-07-07 ENCOUNTER — Telehealth: Payer: Self-pay

## 2018-07-07 NOTE — Telephone Encounter (Signed)
Left message with husband to have patient call back to schedule med check appointment.  Form in brown folder.

## 2018-07-12 ENCOUNTER — Other Ambulatory Visit: Payer: Self-pay

## 2018-07-12 ENCOUNTER — Emergency Department (HOSPITAL_COMMUNITY)
Admission: EM | Admit: 2018-07-12 | Discharge: 2018-07-13 | Disposition: A | Discharge status: Home / Self Care | Payer: BLUE CROSS/BLUE SHIELD | Attending: Emergency Medicine | Admitting: Emergency Medicine

## 2018-07-12 DIAGNOSIS — E119 Type 2 diabetes mellitus without complications: Secondary | ICD-10-CM | POA: Diagnosis not present

## 2018-07-12 DIAGNOSIS — F17210 Nicotine dependence, cigarettes, uncomplicated: Secondary | ICD-10-CM | POA: Insufficient documentation

## 2018-07-12 DIAGNOSIS — Z7982 Long term (current) use of aspirin: Secondary | ICD-10-CM | POA: Diagnosis not present

## 2018-07-12 DIAGNOSIS — J029 Acute pharyngitis, unspecified: Secondary | ICD-10-CM | POA: Diagnosis present

## 2018-07-12 DIAGNOSIS — T781XXA Other adverse food reactions, not elsewhere classified, initial encounter: Secondary | ICD-10-CM | POA: Insufficient documentation

## 2018-07-12 DIAGNOSIS — Z79899 Other long term (current) drug therapy: Secondary | ICD-10-CM | POA: Diagnosis not present

## 2018-07-12 DIAGNOSIS — T7840XA Allergy, unspecified, initial encounter: Secondary | ICD-10-CM

## 2018-07-12 DIAGNOSIS — J45909 Unspecified asthma, uncomplicated: Secondary | ICD-10-CM | POA: Insufficient documentation

## 2018-07-12 DIAGNOSIS — I10 Essential (primary) hypertension: Secondary | ICD-10-CM | POA: Diagnosis not present

## 2018-07-12 DIAGNOSIS — Z7984 Long term (current) use of oral hypoglycemic drugs: Secondary | ICD-10-CM | POA: Diagnosis not present

## 2018-07-12 MED ORDER — DIPHENHYDRAMINE HCL 50 MG/ML IJ SOLN
25.0000 mg | Freq: Once | INTRAMUSCULAR | Status: AC
  Administered 2018-07-12: 25 mg via INTRAVENOUS
  Filled 2018-07-12: qty 1

## 2018-07-12 MED ORDER — METHYLPREDNISOLONE SODIUM SUCC 125 MG IJ SOLR
125.0000 mg | Freq: Once | INTRAMUSCULAR | Status: AC
  Administered 2018-07-12: 125 mg via INTRAVENOUS
  Filled 2018-07-12: qty 2

## 2018-07-12 MED ORDER — SODIUM CHLORIDE 0.9 % IV BOLUS
1000.0000 mL | Freq: Once | INTRAVENOUS | Status: AC
  Administered 2018-07-12: 1000 mL via INTRAVENOUS

## 2018-07-12 MED ORDER — EPINEPHRINE 0.3 MG/0.3ML IJ SOAJ: 0.3000 mg | Freq: Once | INTRAMUSCULAR | 1 refills | Status: DC | PRN

## 2018-07-12 MED ORDER — FAMOTIDINE IN NACL 20-0.9 MG/50ML-% IV SOLN
20.0000 mg | INTRAVENOUS | Status: AC
  Administered 2018-07-12: 20 mg via INTRAVENOUS
  Filled 2018-07-12: qty 50

## 2018-07-12 MED ORDER — PREDNISONE 20 MG PO TABS: ORAL_TABLET | ORAL | 0 refills | Status: DC

## 2018-07-12 NOTE — ED Provider Notes (Addendum)
[facility] DEPT Provider Note   CSN: [id number] Arrival date & time: 07/12/18  1855     History   Chief Complaint Chief Complaint  Patient presents with  . Allergic Reaction    HPI Sydney Williams is a 52 y.o. female.  The history is provided by the patient and medical records.    52 y.o. F with hx of asthma, DM, fibroids, genital herpes, GERD, HLP, HTN, migraines, presenting to the ED for allergic reaction.  Patient states today at church she ate some jambalaya and spinach salad, states she has never eaten [place] before and is not entirely sure of all the ingredients.  States symptoms started around 4 PM, initially it was just a scratchy throat but symptoms have since gotten worse.  She denies any difficulty swallowing but states it feels "odd" when she swallows.  She has not had any chest pain or shortness of breath.  States she feels like it now the left side of her face, lips, and tongue are starting to swell.  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  She has not started any new medications recently.  She did not take any medications for her symptoms prior to arrival.  Past Medical History:  Diagnosis Date  . Asthma    2015 hospitaliation for asthma  . Diabetes mellitus    vs impaired fasting glucose  . Family history of breast cancer   . Fibroids 2010  . Genital herpes   . GERD (gastroesophageal reflux disease)   . Hyperlipidemia   . Hypertension   . Impaired fasting blood sugar   . Migraines   . Obesity   . Smoker     Patient Active Problem List   Diagnosis Date Noted  . Family history of cervical cancer 02/02/2018  . Family history of breast cancer 02/02/2018  . Vitamin D deficiency 02/02/2018  . Need for hepatitis C screening test 02/02/2018  . Low back pain 09/29/2017  . Abnormal myocardial perfusion study   . Depression 06/19/2017  . Hypokalemia 06/19/2017  . Hypomagnesemia 06/19/2017  . Vaccine counseling  01/08/2017  . 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 Screening for cervical cancer 01/08/2017  . Vision exam with abnormal findings 01/08/2017  . Snoring 09/11/2016  . History of migraine 09/11/2016  . Frequent headaches 09/11/2016  . Muscle cramp 09/11/2016  . Encounter for health maintenance examination in adult 12/04/2015  . Heel pain, bilateral 12/04/2015  . Screening for breast cancer 12/04/2015  . Need for influenza vaccination 12/04/2015  . Moderate persistent asthma 06/01/2015  . Gastroesophageal reflux disease without esophagitis 06/01/2015  . Rhinitis, allergic 06/01/2015  . Genital herpes 11/30/2014  . Smoker 11/30/2014  . Essential hypertension 11/30/2014  . Hyperlipidemia 11/30/2014  . Impaired fasting blood sugar 11/30/2014  . Obesity 07/02/2012    Past Surgical History:  Procedure Laterality Date  . COLONOSCOPY  03/13/2016   Dr. Carlean Purl, normal, repeat 2027  . CYST EXCISION     left neck/postauricular region, benign  . LAPAROSCOPIC ABDOMINAL EXPLORATION     removal of ectopic preg  . LEFT HEART CATH AND CORONARY ANGIOGRAPHY N/A 06/23/2017   Procedure: LEFT HEART CATH AND CORONARY ANGIOGRAPHY;  Surgeon: Nelva Bush, MD;  Location: [facility];  Service: Cardiovascular;  Laterality: N/A;  . TUBAL LIGATION       OB History    Gravida  5   Para  1   Term  1   Preterm  AB  2   Living  3     SAB  1   TAB      Ectopic  1   Multiple      Live Births               Home Medications    Prior to Admission medications   Medication Sig Start Date End Date Taking? Authorizing Provider  acyclovir (ZOVIRAX) 200 MG capsule Take 200 mg by mouth 2 (two) times daily.    [provider]  acyclovir (ZOVIRAX) 200 MG capsule TAKE 1 CAPSULE BY MOUTH TWICE DAILY 04/06/18   Tysinger, Camelia Eng, PA-C  albuterol (PROVENTIL HFA;VENTOLIN HFA) 108 (90 Base) MCG/ACT inhaler Inhale 1-2 puffs into the lungs every 6 (six) hours as  needed for wheezing or shortness of breath. 02/03/18   Tysinger, Camelia Eng, PA-C  amLODipine (NORVASC) 5 MG tablet Take 1 tablet (5 mg total) by mouth daily. 02/03/18   Tysinger, Camelia Eng, PA-C  aspirin (EQ ASPIRIN ADULT LOW DOSE) 81 MG EC tablet Take 1 tablet (81 mg total) by mouth daily. Swallow whole. 02/03/18   Tysinger, Camelia Eng, PA-C  buPROPion (WELLBUTRIN XL) 150 MG 24 hr tablet TAKE 1 TABLET BY MOUTH ONCE DAILY 03/09/18   Tysinger, Camelia Eng, PA-C  cholecalciferol (VITAMIN D) 1000 units tablet Take 1 tablet (1,000 Units total) by mouth daily. 02/03/18   Tysinger, Camelia Eng, PA-C  fluticasone (FLONASE) 50 MCG/ACT nasal spray Place 2 sprays into both nostrils daily. Patient not taking: Reported on 02/02/2018 12/15/17   Ocie Cornfield T, PA-C  Fluticasone-Salmeterol (ADVAIR) 250-50 MCG/DOSE AEPB Inhale 1 puff into the lungs 2 (two) times daily. 02/03/18   Tysinger, Camelia Eng, PA-C  hydrochlorothiazide (MICROZIDE) 12.5 MG capsule Take 1 capsule (12.5 mg total) by mouth daily. 02/03/18   Tysinger, Camelia Eng, PA-C  ibuprofen (ADVIL,MOTRIN) 200 MG tablet Take 800 mg by mouth every 6 (six) hours as needed for moderate pain.    [provider]  Magnesium 100 MG TABS Take 1 tablet (100 mg total) by mouth daily. Patient not taking: Reported on 02/02/2018 07/08/17   Tysinger, Camelia Eng, PA-C  metFORMIN (GLUCOPHAGE) 500 MG tablet Take 1 tablet (500 mg total) by mouth daily with breakfast. 02/03/18   Tysinger, Camelia Eng, PA-C  methocarbamol (ROBAXIN) 500 MG tablet Take 1 tablet (500 mg total) by mouth at bedtime as needed for muscle spasms. 09/29/17   Tysinger, Camelia Eng, PA-C  metoprolol tartrate (LOPRESSOR) 50 MG tablet Take 1 tablet (50 mg total) by mouth 2 (two) times daily. 02/03/18   Tysinger, Camelia Eng, PA-C  simvastatin (ZOCOR) 10 MG tablet Take 1 tablet (10 mg total) by mouth daily. 02/03/18   Tysinger, Camelia Eng, PA-C    Family History Family History  Problem Relation Age of Onset  . Diabetes Mother   . Hypertension Mother     . Aneurysm Mother   . Stroke Mother   . Cancer Mother        cervical cancer  . Cancer Maternal Aunt        breast  . Cancer Cousin        breast/breast  . Heart disease Neg Hx   . Colon cancer Neg Hx     Social History Social History   Tobacco Use  . Smoking status: Current Every Day Smoker    Packs/day: 0.25    Years: 14.00    Pack years: 3.50    Types: Cigarettes  . Smokeless tobacco:  Never Used  Substance Use Topics  . Alcohol use: Yes    Comment: occ  . Drug use: No     Allergies   Potassium chloride; Pneumococcal vaccines; and Vicodin [hydrocodone-acetaminophen]   Review of Systems Review of Systems  HENT: Positive for sore throat.   All other systems reviewed and are negative.    Physical Exam Updated Vital Signs BP (!) 159/107 (BP Location: Left Arm)   Pulse 91   Temp 98.5 F (36.9 C) (Oral)   Resp 20   Ht 5\' 11"  (1.803 m)   Wt 109.3 kg   LMP 06/29/2015 (Exact Date)   SpO2 98%   BMI 33.61 kg/m   Physical Exam  Constitutional: She is oriented to person, place, and time. She appears well-developed and well-nourished.  HENT:  Head: Normocephalic and atraumatic.  Right Ear: Tympanic membrane and ear canal normal.  Left Ear: Tympanic membrane and ear canal normal.  Nose: Nose normal.  Mouth/Throat: Uvula is midline, oropharynx is clear and moist and mucous membranes are normal.  No apparent lip, tongue, facial, or neck swelling; no trouble swallowing, handling secretions well, normal phonation without stridor  Eyes: Pupils are equal, round, and reactive to light. Conjunctivae and EOM are normal.  Neck: Normal range of motion.  Cardiovascular: Normal rate, regular rhythm and normal heart sounds.  Pulmonary/Chest: Effort normal and breath sounds normal. No stridor. No respiratory distress.  Lungs clear  Abdominal: Soft. Bowel sounds are normal.  Musculoskeletal: Normal range of motion.  Neurological: She is alert and oriented to person, place,  and time.  Skin: Skin is warm and dry. No rash noted.  No rash  Psychiatric: She has a normal mood and affect.  Nursing note and vitals reviewed.    ED Treatments / Results  Labs (all labs ordered are listed, but only abnormal results are displayed) Labs Reviewed - No data to display  EKG None  Radiology No results found.  Procedures Procedures (including critical care time)  Medications Ordered in ED Medications  sodium chloride 0.9 % bolus 1,000 mL (0 mLs Intravenous Stopped 07/12/18 2320)  diphenhydrAMINE (BENADRYL) injection 25 mg (25 mg Intravenous Given 07/12/18 2234)  methylPREDNISolone sodium succinate (SOLU-MEDROL) 125 mg/2 mL injection 125 mg (125 mg Intravenous Given 07/12/18 2236)  famotidine (PEPCID) IVPB 20 mg premix (0 mg Intravenous Stopped 07/12/18 2308)     Initial Impression / Assessment and Plan / ED Course  I have reviewed the triage vital signs and the nursing notes.  Pertinent labs & imaging results that were available during my care of the patient were reviewed by me and considered in my medical decision making (see chart for details).  52 year old female presenting to the ED with allergic reaction.  Started around 4 PM.  She ate [place] today which is only thing new regards to food or medications.  Complains of scratchy throat, feeling of swelling to her lips and tongue and left side of her face.  She is afebrile and nontoxic in appearance.  She does not have any visible lip or tongue swelling on my exam.  Her airway is patent, handling secretions well.  Normal phonation without stridor.  Lungs are clear without wheezes or rhonchi.  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  Will treat here with Benadryl, Solu-Medrol, Pepcid, and IV fluids.  Will monitor closely.  11:48 PM Patient reports resolution of symptoms.  Airway remains patent, still no visible swelling.  Tolerating PO fluids without issue.  Appears stable for discharge.  Will d/c home with  prednisone taper, given script for epi-pen and instructed on indications for use and need to come to ER for evaluation if used.  Will follow-up with her primary care doctor.  Return here for any new/acute changes.  Final Clinical Impressions(s) / ED Diagnoses   Final diagnoses:  Allergic reaction, initial encounter    ED Discharge Orders         Ordered    predniSONE (DELTASONE) 20 MG tablet     07/12/18 2350    EPINEPHrine (EPIPEN 2-PAK) 0.3 mg/0.3 mL IJ SOAJ injection  Once PRN     07/12/18 2350           Larene Pickett, PA-C 07/13/18 0033    Larene Pickett, PA-C 07/13/18 [place], [place], DO 07/13/18 1231

## 2018-07-12 NOTE — ED Triage Notes (Signed)
Pt to ED with c/o of allergic reaction. Pt side of face, tongue, and bottom lip is swollen. Airway is patent, but Pt complains of trouble swallowing. Pt states her throat hurts as well with a ratting of 8/10. Pt states she took her normal medicines this morning as usual and then had jambalaya, rice, and spinach salad at church. Pt states this swelling started after church and did not take any benadryl. Pt is A&O x 4 and ambulatory.

## 2018-07-12 NOTE — Discharge Instructions (Signed)
Take the prescribed medication as directed.  Can continue benadryl if needed. Only use epi pen for severe lip/tongue swelling causing difficulty swallowing or breathing, sensation of throat closing, or similar. If used, you need to come to the ER for evaluation. Follow-up with your primary care doctor. Return to the ED for new or worsening symptoms.

## 2018-07-12 NOTE — ED Notes (Signed)
ED Provider at bedside. 

## 2018-07-30 ENCOUNTER — Telehealth: Payer: Self-pay | Admitting: Medical

## 2018-07-30 NOTE — Telephone Encounter (Signed)
Call cardiology, Dr. Kyla Balzarine office.   She is having orthopedic surgery soon.   I need to know if cardiology feels she is safe for surgery?  Also, get her in for surgery clearance.

## 2018-07-31 NOTE — Telephone Encounter (Signed)
Left message for patient to call back to schedule an appointment for surgical clearance.  

## 2018-08-05 ENCOUNTER — Other Ambulatory Visit: Payer: Self-pay | Admitting: Medical

## 2018-08-05 DIAGNOSIS — A6000 Herpesviral infection of urogenital system, unspecified: Secondary | ICD-10-CM

## 2018-08-05 NOTE — Telephone Encounter (Signed)
[facility] is requesting to fill pt acyclovir. Please advise [facility] 

## 2018-08-25 ENCOUNTER — Telehealth: Payer: Self-pay | Admitting: Internal Medicine

## 2018-08-25 NOTE — Telephone Encounter (Signed)
Pt called stating she needed a clearance letter for her to stop taking aspirin. I was going to put her on the schedule but she is having to have surgery through worker's comp and we can't see her for worker's comp. But she was told to come here to get a clearance. Please advise if you can give her the ok to stop taking aspirin prior to surgery

## 2018-08-26 ENCOUNTER — Other Ambulatory Visit: Payer: Self-pay

## 2018-08-26 ENCOUNTER — Emergency Department (HOSPITAL_COMMUNITY)
Admission: EM | Admit: 2018-08-26 | Discharge: 2018-08-26 | Disposition: A | Discharge status: Home / Self Care | Payer: BLUE CROSS/BLUE SHIELD | Attending: Emergency Medicine | Admitting: Emergency Medicine

## 2018-08-26 ENCOUNTER — Encounter (HOSPITAL_COMMUNITY): Payer: Self-pay

## 2018-08-26 DIAGNOSIS — J4521 Mild intermittent asthma with (acute) exacerbation: Secondary | ICD-10-CM | POA: Diagnosis not present

## 2018-08-26 DIAGNOSIS — F17210 Nicotine dependence, cigarettes, uncomplicated: Secondary | ICD-10-CM | POA: Diagnosis not present

## 2018-08-26 DIAGNOSIS — J45901 Unspecified asthma with (acute) exacerbation: Secondary | ICD-10-CM

## 2018-08-26 DIAGNOSIS — J45909 Unspecified asthma, uncomplicated: Secondary | ICD-10-CM | POA: Diagnosis present

## 2018-08-26 DIAGNOSIS — E119 Type 2 diabetes mellitus without complications: Secondary | ICD-10-CM | POA: Diagnosis not present

## 2018-08-26 DIAGNOSIS — I10 Essential (primary) hypertension: Secondary | ICD-10-CM | POA: Diagnosis not present

## 2018-08-26 MED ORDER — PREDNISONE 20 MG PO TABS: ORAL_TABLET | ORAL | 0 refills | Status: DC

## 2018-08-26 MED ORDER — ALBUTEROL SULFATE (2.5 MG/3ML) 0.083% IN NEBU
5.0000 mg | INHALATION_SOLUTION | Freq: Once | RESPIRATORY_TRACT | Status: AC
  Administered 2018-08-26: 5 mg via RESPIRATORY_TRACT
  Filled 2018-08-26 (×2): qty 6

## 2018-08-26 MED ORDER — PREDNISONE 20 MG PO TABS
60.0000 mg | ORAL_TABLET | Freq: Once | ORAL | Status: AC
  Administered 2018-08-26: 60 mg via ORAL
  Filled 2018-08-26: qty 3

## 2018-08-26 NOTE — ED Provider Notes (Signed)
Emergency Department Provider Note   I have reviewed the triage vital signs and the nursing notes.   HISTORY  Chief Complaint Asthma   HPI Sydney Williams is a 52 y.o. female history of asthma on twice daily Advair and as needed albuterol the presents the emergency department today with worsening shortness of breath and cough.  Patient states that every time she gets a flu shot she has an asthma exacerbation.  She had a flu shot last week and is a progressively worsening symptoms significantly worse over the night last night.  Tried albuterol multiple times without relief.  No fevers.  She has been coughing a lot but not productive cough.  No specific chest pain or lower extremity swelling.  She states this is just like her previous asthma exacerbations just not controlled by her albuterol. No other associated or modifying symptoms.    Past Medical History:  Diagnosis Date  . Asthma    2015 hospitaliation for asthma  . Diabetes mellitus    vs impaired fasting glucose  . Family history of breast cancer   . Fibroids 2010  . Genital herpes   . GERD (gastroesophageal reflux disease)   . Hyperlipidemia   . Hypertension   . Impaired fasting blood sugar   . Migraines   . Obesity   . Smoker     Patient Active Problem List   Diagnosis Date Noted  . Family history of cervical cancer 02/02/2018  . Family history of breast cancer 02/02/2018  . Vitamin D deficiency 02/02/2018  . Need for hepatitis C screening test 02/02/2018  . Low back pain 09/29/2017  . Abnormal myocardial perfusion study   . Depression 06/19/2017  . Hypokalemia 06/19/2017  . Hypomagnesemia 06/19/2017  . Vaccine counseling 01/08/2017  . 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 Screening for cervical cancer 01/08/2017  . Vision exam with abnormal findings 01/08/2017  . Snoring 09/11/2016  . History of migraine 09/11/2016  . Frequent headaches 09/11/2016  . Muscle cramp 09/11/2016  . Encounter  for health maintenance examination in adult 12/04/2015  . Heel pain, bilateral 12/04/2015  . Screening for breast cancer 12/04/2015  . Need for influenza vaccination 12/04/2015  . Moderate persistent asthma 06/01/2015  . Gastroesophageal reflux disease without esophagitis 06/01/2015  . Rhinitis, allergic 06/01/2015  . Genital herpes 11/30/2014  . Smoker 11/30/2014  . Essential hypertension 11/30/2014  . Hyperlipidemia 11/30/2014  . Impaired fasting blood sugar 11/30/2014  . Obesity 07/02/2012    Past Surgical History:  Procedure Laterality Date  . COLONOSCOPY  03/13/2016   Dr. Carlean Purl, normal, repeat 2027  . CYST EXCISION     left neck/postauricular region, benign  . LAPAROSCOPIC ABDOMINAL EXPLORATION     removal of ectopic preg  . LEFT HEART CATH AND CORONARY ANGIOGRAPHY N/A 06/23/2017   Procedure: LEFT HEART CATH AND CORONARY ANGIOGRAPHY;  Surgeon: Nelva Bush, MD;  Location: [facility];  Service: Cardiovascular;  Laterality: N/A;  . TUBAL LIGATION      Current Outpatient Rx  . Order #: [id number] Class: Normal  . Order #: [id number] Class: Normal  . Order #: [id number] Class: Normal  . Order #: [id number] Class: Normal  . Order #: [id number] Class: Normal  . Order #: [id number] Class: Print  . Order #: [id number] Class: Normal  . Order #: [id number] Class: Normal  . Order #: [id number] Class: Historical Med  . Order #: [id number] Class: Normal  . Order #: [id number] Class: Normal  . Order #: [id number] Class: Historical Med  . Order #: [id number] Class: Historical Med  . [  START ON 08/27/2018] Order #: [id number] Class: Print    Allergies Lisinopril; Potassium chloride; Pneumococcal vaccines; and Vicodin [hydrocodone-acetaminophen]  Family History  Problem Relation Age of Onset  . Diabetes Mother   . Hypertension Mother   . Aneurysm Mother   . Stroke Mother   . Cancer Mother        cervical cancer  . Cancer Maternal Aunt        breast  . Cancer Cousin        breast/breast   . Heart disease Neg Hx   . Colon cancer Neg Hx     Social History Social History   Tobacco Use  . Smoking status: Current Every Day Smoker    Packs/day: 0.25    Years: 14.00    Pack years: 3.50    Types: Cigarettes  . Smokeless tobacco: Never Used  Substance Use Topics  . Alcohol use: Yes    Comment: occ  . Drug use: No    Review of Systems  All other systems negative except as documented in the HPI. All pertinent positives and negatives as reviewed in the HPI. ____________________________________________   PHYSICAL EXAM:  VITAL SIGNS: ED Triage Vitals [08/26/18 0818]  Enc Vitals Group     BP (!) 157/100     Pulse Rate 78     Resp 16     Temp 98.9 F (37.2 C)     Temp Source Oral     SpO2 95 %     Weight 241 lb (109.3 kg)     Height 5\' 11"  (1.803 m)     Head Circumference      Peak Flow      Pain Score 9     Pain Loc      Pain Edu?      Excl. in [place]?     Constitutional: Alert and oriented. Well appearing and in no acute distress. Eyes: Conjunctivae are normal. PERRL. EOMI. Head: Atraumatic. Nose: No congestion/rhinnorhea. Mouth/Throat: Mucous membranes are moist.  Oropharynx non-erythematous. Neck: No stridor.  No meningeal signs.   Cardiovascular: Normal rate, regular rhythm. Good peripheral circulation. Grossly normal heart sounds.   Respiratory: Normal respiratory effort.  No retractions. Lungs diminished with end expiratory wheezing, finishing up duoneb at time of my evaluation. Gastrointestinal: Soft and nontender. No distention.  Musculoskeletal: No lower extremity tenderness nor edema. No gross deformities of extremities. Neurologic:  Normal speech and language. No gross focal neurologic deficits are appreciated.  Skin:  Skin is warm, dry and intact. No rash noted.   ____________________________________________    INITIAL IMPRESSION / ASSESSMENT AND PLAN / ED COURSE  Likely asthma exacerbation.   Reevaluation patient with oxygen around  9091% but much more clear lungs and just finished a breathing treatment.  Will check again in about half an hour to 45 minutes for disposition. Suspect discharge.   Reevaluation, not tachypneic O2 ranging from 91-95 during conversation. Stated she walked without trouble, will dc for continuation of steroid burst along with pcp follow up. Return here for new/worssening symptoms or not improving.      Pertinent labs & imaging results that were available during my care of the patient were reviewed by me and considered in my medical decision making (see chart for details).  ____________________________________________  FINAL CLINICAL IMPRESSION(S) / ED DIAGNOSES  Final diagnoses:  Mild asthma with exacerbation, unspecified whether persistent     MEDICATIONS GIVEN DURING THIS VISIT:  Medications  albuterol (PROVENTIL) (2.5 MG/3ML) 0.083% nebulizer solution 5  mg (5 mg Nebulization Given 08/26/18 0835)  predniSONE (DELTASONE) tablet 60 mg (60 mg Oral Given 08/26/18 0905)  albuterol (PROVENTIL) (2.5 MG/3ML) 0.083% nebulizer solution 5 mg (5 mg Nebulization Given 08/26/18 0928)     NEW OUTPATIENT MEDICATIONS STARTED DURING THIS VISIT:  New Prescriptions   PREDNISONE (DELTASONE) 20 MG TABLET    2 tabs po daily x 4 days    Note:  This note was prepared with assistance of Dragon voice recognition software. Occasional wrong-word or sound-a-like substitutions may have occurred due to the inherent limitations of voice recognition software.   Merrily Pew, MD 08/26/18 [phone]

## 2018-08-26 NOTE — ED Triage Notes (Signed)
Pt states that she is having an asthma exacerbation.  Pt states that it has gotten progressively worse since Monday. Pt states she has not gotten relief with her inhaler.

## 2018-08-28 NOTE — Telephone Encounter (Signed)
Given her findings and cath last year, I recommend at least coming in for EKG and eval just to make sure nothing looks different on cath from a year ago.   Please schedule OV.  In her case the aspirin is for prevention of heart attack, so she is not taking it due to clot.    The other option is recheck with cardiology for 1 year f/u from cath and abnormal ultrasound of heart.

## 2018-08-31 NOTE — Telephone Encounter (Signed)
Forwarding to you :)

## 2018-08-31 NOTE — Telephone Encounter (Signed)
Left message on voicemail for patient to call back. 

## 2018-09-01 NOTE — Telephone Encounter (Signed)
Left message on voicemail for patient to call back. 

## 2018-09-02 NOTE — Telephone Encounter (Signed)
I am closing this chart due to the patient has not returned my calls.

## 2018-09-10 ENCOUNTER — Other Ambulatory Visit: Payer: Self-pay | Admitting: Medical

## 2018-09-10 NOTE — Telephone Encounter (Signed)
Is this ok to refill?  

## 2018-11-22 ENCOUNTER — Other Ambulatory Visit: Payer: Self-pay | Admitting: Family Medicine

## 2018-11-22 DIAGNOSIS — A6000 Herpesviral infection of urogenital system, unspecified: Secondary | ICD-10-CM

## 2018-11-23 NOTE — Telephone Encounter (Signed)
[facility] is requesting to fill pt acyclovir. Please advise [facility]

## 2018-11-23 NOTE — Telephone Encounter (Signed)
Deny refill, get in for med check appt

## 2018-12-11 ENCOUNTER — Other Ambulatory Visit: Payer: Self-pay | Admitting: Medical

## 2018-12-11 NOTE — Telephone Encounter (Signed)
Please call patient to set up yearly physical.   This is a friendly reminder to have them come in for annual wellness exam/physical. Thanks Shane 

## 2018-12-11 NOTE — Telephone Encounter (Signed)
Is this ok to refill?  

## 2018-12-15 NOTE — Telephone Encounter (Signed)
Per Beverlee Nims all messages go to cma

## 2019-01-13 ENCOUNTER — Other Ambulatory Visit: Payer: Self-pay | Admitting: Orthopaedic Surgery

## 2019-01-13 ENCOUNTER — Other Ambulatory Visit: Payer: Self-pay | Admitting: Medical

## 2019-01-18 ENCOUNTER — Other Ambulatory Visit: Payer: Self-pay | Admitting: Orthopaedic Surgery

## 2019-01-18 DIAGNOSIS — M545 Low back pain, unspecified: Secondary | ICD-10-CM

## 2019-01-20 ENCOUNTER — Other Ambulatory Visit: Payer: Self-pay | Admitting: Orthopaedic Surgery

## 2019-01-31 ENCOUNTER — Other Ambulatory Visit: Payer: Self-pay | Admitting: Medical

## 2019-02-03 ENCOUNTER — Telehealth: Payer: Self-pay

## 2019-02-03 NOTE — Telephone Encounter (Signed)
Called pt to advise of CPE needing to be rescheduled. LVM [place]

## 2019-02-07 ENCOUNTER — Emergency Department (HOSPITAL_COMMUNITY): Payer: No Typology Code available for payment source

## 2019-02-07 ENCOUNTER — Other Ambulatory Visit: Payer: Self-pay

## 2019-02-07 ENCOUNTER — Emergency Department (HOSPITAL_COMMUNITY)
Admission: EM | Admit: 2019-02-07 | Discharge: 2019-02-07 | Disposition: A | Discharge status: Home / Self Care | Payer: No Typology Code available for payment source | Attending: Emergency Medicine | Admitting: Emergency Medicine

## 2019-02-07 DIAGNOSIS — Y990 Civilian activity done for income or pay: Secondary | ICD-10-CM | POA: Diagnosis not present

## 2019-02-07 DIAGNOSIS — Z7984 Long term (current) use of oral hypoglycemic drugs: Secondary | ICD-10-CM | POA: Insufficient documentation

## 2019-02-07 DIAGNOSIS — Y929 Unspecified place or not applicable: Secondary | ICD-10-CM | POA: Insufficient documentation

## 2019-02-07 DIAGNOSIS — Z79899 Other long term (current) drug therapy: Secondary | ICD-10-CM | POA: Diagnosis not present

## 2019-02-07 DIAGNOSIS — S8991XA Unspecified injury of right lower leg, initial encounter: Secondary | ICD-10-CM | POA: Diagnosis present

## 2019-02-07 DIAGNOSIS — Y939 Activity, unspecified: Secondary | ICD-10-CM | POA: Diagnosis not present

## 2019-02-07 DIAGNOSIS — W19XXXA Unspecified fall, initial encounter: Secondary | ICD-10-CM | POA: Diagnosis not present

## 2019-02-07 DIAGNOSIS — Z7982 Long term (current) use of aspirin: Secondary | ICD-10-CM | POA: Diagnosis not present

## 2019-02-07 DIAGNOSIS — F17200 Nicotine dependence, unspecified, uncomplicated: Secondary | ICD-10-CM | POA: Insufficient documentation

## 2019-02-07 DIAGNOSIS — J45909 Unspecified asthma, uncomplicated: Secondary | ICD-10-CM | POA: Diagnosis not present

## 2019-02-07 DIAGNOSIS — I10 Essential (primary) hypertension: Secondary | ICD-10-CM | POA: Insufficient documentation

## 2019-02-07 DIAGNOSIS — F17210 Nicotine dependence, cigarettes, uncomplicated: Secondary | ICD-10-CM | POA: Insufficient documentation

## 2019-02-07 NOTE — Progress Notes (Signed)
Orthopedic Tech Progress Note Patient Details:  Sydney Williams [birth year] [id number]  Ortho Devices Type of Ortho Device: Crutches, Knee Immobilizer Ortho Device/Splint Location: LRE Ortho Device/Splint Interventions: Adjustment, Application, Ordered   Post Interventions Patient Tolerated: Well Instructions Provided: Care of device, Adjustment of device   [name] 02/07/2019, 12:29 PM

## 2019-02-07 NOTE — ED Notes (Signed)
Per PA, Pt ok to eat/drink. Pt given Sprite and graham crackers.

## 2019-02-07 NOTE — ED Triage Notes (Signed)
Pt reports she was at work she she tripped and had a ground level fall onto a carpeted area. Pt reports she hit her left shoulder and right knee. Denies hitting her head or any loss of consciousness. No obvious deformities present upon ED arrival. Pt reports that she hasn't been able to walk on her right leg since the fall. Pt reporting 8/10 burning pain in her right knee.

## 2019-02-07 NOTE — ED Notes (Signed)
Patient transported to X-ray 

## 2019-02-07 NOTE — Discharge Instructions (Signed)
Follow-up with your employer's Workmen's Comp. specialist or the orthopedist provided.  Ice and elevate your knee.

## 2019-02-08 ENCOUNTER — Encounter: Payer: BLUE CROSS/BLUE SHIELD | Admitting: Medical

## 2019-02-09 NOTE — ED Provider Notes (Signed)
[facility] Provider Note   CSN: [id number] Arrival date & time: 02/07/19  7096    History   Chief Complaint Chief Complaint  Patient presents with  . Fall    HPI [facility] is a 53 y.o. female.     HPI Patient presents to the emergency department with a right knee injury following a trip and fall at work.  The patient states that she also has a little bit of left shoulder pain but this is not significant.  The patient states she did not hit her head.  She states that the knee is painful to palpation and certain movements.  Patient denies any other injuries. Past Medical History:  Diagnosis Date  . Asthma    2015 hospitaliation for asthma  . Diabetes mellitus    vs impaired fasting glucose  . Family history of breast cancer   . Fibroids 2010  . Genital herpes   . GERD (gastroesophageal reflux disease)   . Hyperlipidemia   . Hypertension   . Impaired fasting blood sugar   . Migraines   . Obesity   . Smoker     Patient Active Problem List   Diagnosis Date Noted  . Family history of cervical cancer 02/02/2018  . Family history of breast cancer 02/02/2018  . Vitamin D deficiency 02/02/2018  . Need for hepatitis C screening test 02/02/2018  . Low back pain 09/29/2017  . Abnormal myocardial perfusion study   . Depression 06/19/2017  . Hypokalemia 06/19/2017  . Hypomagnesemia 06/19/2017  . Vaccine counseling 01/08/2017  . 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 Screening for cervical cancer 01/08/2017  . Vision exam with abnormal findings 01/08/2017  . Snoring 09/11/2016  . History of migraine 09/11/2016  . Frequent headaches 09/11/2016  . Muscle cramp 09/11/2016  . Encounter for health maintenance examination in adult 12/04/2015  . Heel pain, bilateral 12/04/2015  . Screening for breast cancer 12/04/2015  . Need for influenza vaccination 12/04/2015  . Moderate persistent asthma 06/01/2015  .  Gastroesophageal reflux disease without esophagitis 06/01/2015  . Rhinitis, allergic 06/01/2015  . Genital herpes 11/30/2014  . Smoker 11/30/2014  . Essential hypertension 11/30/2014  . Hyperlipidemia 11/30/2014  . Impaired fasting blood sugar 11/30/2014  . Obesity 07/02/2012    Past Surgical History:  Procedure Laterality Date  . COLONOSCOPY  03/13/2016   Dr. Carlean Purl, normal, repeat 2027  . CYST EXCISION     left neck/postauricular region, benign  . LAPAROSCOPIC ABDOMINAL EXPLORATION     removal of ectopic preg  . LEFT HEART CATH AND CORONARY ANGIOGRAPHY N/A 06/23/2017   Procedure: LEFT HEART CATH AND CORONARY ANGIOGRAPHY;  Surgeon: Nelva Bush, MD;  Location: [facility];  Service: Cardiovascular;  Laterality: N/A;  . TUBAL LIGATION       OB History    Gravida  5   Para  1   Term  1   Preterm      AB  2   Living  3     SAB  1   TAB      Ectopic  1   Multiple      Live Births               Home Medications    Prior to Admission medications   Medication Sig Start Date End Date Taking? Authorizing Provider  acyclovir (ZOVIRAX) 200 MG capsule TAKE 1 CAPSULE BY MOUTH TWICE DAILY 11/23/18   Tysinger, Camelia Eng, PA-C  albuterol (PROVENTIL HFA;VENTOLIN HFA) 108 (90 Base) MCG/ACT inhaler Inhale 1-2 puffs into the lungs every 6 (six) hours as needed for wheezing or shortness of breath. 02/03/18   Tysinger, Camelia Eng, PA-C  amLODipine (NORVASC) 5 MG tablet Take 1 tablet by mouth once daily 01/14/19   Tysinger, Camelia Eng, PA-C  aspirin (EQ ASPIRIN ADULT LOW DOSE) 81 MG EC tablet Take 1 tablet (81 mg total) by mouth daily. Swallow whole. 02/03/18   Tysinger, Camelia Eng, PA-C  buPROPion (WELLBUTRIN XL) 150 MG 24 hr tablet TAKE 1 TABLET BY MOUTH ONCE DAILY 12/11/18   Tysinger, Camelia Eng, PA-C  cholecalciferol (VITAMIN D) 1000 units tablet Take 1 tablet (1,000 Units total) by mouth daily. 02/03/18   Tysinger, Camelia Eng, PA-C  EPINEPHrine (EPIPEN 2-PAK) 0.3 mg/0.3 mL IJ SOAJ  injection Inject 0.3 mLs (0.3 mg total) into the muscle once as needed for up to 1 dose (for severe allergic reaction). CAll 911 immediately if you have to use this medicine 07/12/18   Baird Cancer, Vilinda Blanks, PA-C  Fluticasone-Salmeterol (ADVAIR) 250-50 MCG/DOSE AEPB Inhale 1 puff into the lungs 2 (two) times daily. 02/03/18   Tysinger, Camelia Eng, PA-C  gabapentin (NEURONTIN) 300 MG capsule Take 300 mg by mouth at bedtime as needed (back pain).  08/20/18   [provider]  hydrochlorothiazide (MICROZIDE) 12.5 MG capsule Take 1 capsule by mouth once daily 02/01/19   Tysinger, Camelia Eng, PA-C  ibuprofen (ADVIL,MOTRIN) 200 MG tablet Take 800 mg by mouth every 6 (six) hours as needed for headache or moderate pain.     [provider]  metFORMIN (GLUCOPHAGE) 500 MG tablet Take 1 tablet (500 mg total) by mouth daily with breakfast. 02/03/18   Tysinger, Camelia Eng, PA-C  methocarbamol (ROBAXIN) 500 MG tablet Take 1 tablet (500 mg total) by mouth at bedtime as needed for muscle spasms. 09/29/17   Tysinger, Camelia Eng, PA-C  predniSONE (DELTASONE) 20 MG tablet 2 tabs po daily x 4 days 08/27/18   Mesner, Corene Cornea, MD  PRILOSEC OTC 20 MG tablet Take 20 mg by mouth 2 (two) times daily. 08/20/18   [provider]    Family History Family History  Problem Relation Age of Onset  . Diabetes Mother   . Hypertension Mother   . Aneurysm Mother   . Stroke Mother   . Cancer Mother        cervical cancer  . Cancer Maternal Aunt        breast  . Cancer Cousin        breast/breast  . Heart disease Neg Hx   . Colon cancer Neg Hx     Social History Social History   Tobacco Use  . Smoking status: Current Every Day Smoker    Packs/day: 0.25    Years: 14.00    Pack years: 3.50    Types: Cigarettes  . Smokeless tobacco: Never Used  Substance Use Topics  . Alcohol use: Yes    Comment: occ  . Drug use: No     Allergies   Lisinopril; Potassium chloride; Pneumococcal vaccines; and Vicodin  [hydrocodone-acetaminophen]   Review of Systems Review of Systems All other systems negative except as documented in the HPI. All pertinent positives and negatives as reviewed in the HPI.  Physical Exam Updated Vital Signs BP (!) 151/92 (BP Location: Right Arm)   Pulse 84   Temp 98.7 F (37.1 C) (Oral)   Resp 17   Ht 5\' 11"  (1.803 m)   Wt 110.2  kg   LMP 06/29/2015 (Exact Date)   SpO2 97%   BMI 33.89 kg/m   Physical Exam Vitals signs and nursing note reviewed.  Constitutional:      General: She is not in acute distress.    Appearance: She is well-developed.  HENT:     Head: Normocephalic and atraumatic.  Eyes:     Pupils: Pupils are equal, round, and reactive to light.  Pulmonary:     Effort: Pulmonary effort is normal.  Musculoskeletal:     Right knee: She exhibits bony tenderness. Tenderness found. Medial joint line tenderness noted.  Skin:    General: Skin is warm and dry.  Neurological:     Mental Status: She is alert and oriented to person, place, and time.      ED Treatments / Results  Labs (all labs ordered are listed, but only abnormal results are displayed) Labs Reviewed - No data to display  EKG None  Radiology No results found.  Procedures Procedures (including critical care time)  Medications Ordered in ED Medications - No data to display   Initial Impression / Assessment and Plan / ED Course  I have reviewed the triage vital signs and the nursing notes.  Pertinent labs & imaging results that were available during my care of the patient were reviewed by me and considered in my medical decision making (see chart for details).       Patient will be referred to orthopedics.  Have advised her to ice and elevate the knee.  Placed in knee immobilizer.  Patient is advised plan and all questions were answered.  Final Clinical Impressions(s) / ED Diagnoses   Final diagnoses:  Fall, initial encounter  Injury of right knee, initial encounter     ED Discharge Orders    None       Dalia Heading, PA-C 02/09/19 1545    Gareth Morgan, MD 02/16/19 [phone]

## 2019-02-15 ENCOUNTER — Other Ambulatory Visit: Payer: Self-pay | Admitting: Medical

## 2019-02-15 NOTE — Telephone Encounter (Signed)
Is this ok to refill?  

## 2019-02-18 NOTE — Telephone Encounter (Signed)
Is this ok to refill?  

## 2019-02-18 NOTE — Telephone Encounter (Signed)
Did you see me prior reply?   Get appt for physical, then REFILL

## 2019-02-18 NOTE — Telephone Encounter (Signed)
Approve Vit D but get on schedule for physical

## 2019-04-07 ENCOUNTER — Other Ambulatory Visit: Payer: Self-pay | Admitting: Medical

## 2019-04-07 DIAGNOSIS — A6000 Herpesviral infection of urogenital system, unspecified: Secondary | ICD-10-CM

## 2019-04-07 DIAGNOSIS — I10 Essential (primary) hypertension: Secondary | ICD-10-CM

## 2019-04-07 DIAGNOSIS — E785 Hyperlipidemia, unspecified: Secondary | ICD-10-CM

## 2019-04-07 NOTE — Telephone Encounter (Signed)
Needs appt, only give #30 day supply

## 2019-04-07 NOTE — Telephone Encounter (Signed)
Pt is waiting on her insurance to kick in with [facility], she can not come in at this time until she gets insurance. Is it ok to refill more than 30 days and she needs generic since she will be paying out of pocket for this

## 2019-04-07 NOTE — Telephone Encounter (Signed)
Is this ok to refill?  

## 2019-04-12 ENCOUNTER — Encounter: Payer: BLUE CROSS/BLUE SHIELD | Admitting: Medical

## 2019-04-22 ENCOUNTER — Ambulatory Visit: Payer: Self-pay | Attending: Family Medicine | Admitting: Family Medicine

## 2019-04-22 ENCOUNTER — Encounter: Payer: Self-pay | Admitting: Family Medicine

## 2019-04-22 ENCOUNTER — Other Ambulatory Visit: Payer: Self-pay

## 2019-04-22 VITALS — BP 155/90 | HR 109 | Temp 98.0°F | Ht 71.0 in | Wt 245.2 lb

## 2019-04-22 DIAGNOSIS — H6691 Otitis media, unspecified, right ear: Secondary | ICD-10-CM

## 2019-04-22 DIAGNOSIS — I10 Essential (primary) hypertension: Secondary | ICD-10-CM

## 2019-04-22 DIAGNOSIS — R7303 Prediabetes: Secondary | ICD-10-CM

## 2019-04-22 LAB — POCT GLYCOSYLATED HEMOGLOBIN (HGB A1C): Hemoglobin A1C: 6.1 % — AB (ref 4.0–5.6)

## 2019-04-22 MED ORDER — AMOXICILLIN 500 MG PO CAPS: 500.0000 mg | ORAL_CAPSULE | Freq: Two times a day (BID) | ORAL | 0 refills | Status: AC

## 2019-04-22 MED ORDER — HYDROCHLOROTHIAZIDE 25 MG PO TABS: 25.0000 mg | ORAL_TABLET | Freq: Every day | ORAL | 3 refills | Status: DC

## 2019-04-22 MED FILL — AMOXICILLIN 500 MG CAPSULE: 500 | 7 days supply | Qty: 14 | Fill #0

## 2019-04-22 MED FILL — HYDROCHLOROTHIAZIDE 25 MG T: 25 | 30 days supply | Qty: 30 | Fill #0

## 2019-04-22 NOTE — Progress Notes (Signed)
New Patient Office Visit  Subjective:  Patient ID: [facility], female    DOB: 29-Jul-1966  Age: 53 y.o. MRN: [id number]  CC: Establish care  HPI [facility] presents to establish medical care for ongoing evaluation and management of chronic medical issues including mild intermittent asthma, prediabetes, Hypertension, Hyperlipidemia, and history of vitamin D deficiency-most recent Vitamin D level normal in 2019 but 13 in 2017.Marland Kitchen Per chart, her last Hgb A1c was 6.0 on 02/02/2018. History of ASCUS pap done 02/02/2018.         Patient reports that she recently lost her job as well as her insurance after suffering a work-related injury in April of this year.  Patient reports that she fell at work hurting her right knee.  She states that she was able to have refills of all of her chronic medications done for 90-day supplies prior to her insurance running out.  She is currently taking all of her medications including her amlodipine for her blood pressure as well as metoprolol.  On review of medications, she realizes that she does not have her hydrochlorothiazide and will need a new prescription.  She reports a history of elevated blood sugars in the past for which she is currently on metformin 500 mg daily.  She has had no issues with stomach upset related to use of metformin.  She denies any increased thirst, no urinary frequency and no blurred vision related to her blood sugars.  She has noticed that she has had some increased nasal congestion.  She does not really feel as if she is having any postnasal drainage or sore throat.  She does have some ear pressure.  Past Medical History:  Diagnosis Date  . Asthma    2015 hospitaliation for asthma  . Diabetes mellitus    vs impaired fasting glucose  . Family history of breast cancer   . Fibroids 2010  . Genital herpes   . GERD (gastroesophageal reflux disease)   . Hyperlipidemia   . Hypertension   . Impaired fasting blood sugar   . Migraines   . Obesity    . Smoker     Past Surgical History:  Procedure Laterality Date  . COLONOSCOPY  03/13/2016   Dr. Carlean Purl, normal, repeat 2027  . CYST EXCISION     left neck/postauricular region, benign  . LAPAROSCOPIC ABDOMINAL EXPLORATION     removal of ectopic preg  . LEFT HEART CATH AND CORONARY ANGIOGRAPHY N/A 06/23/2017   Procedure: LEFT HEART CATH AND CORONARY ANGIOGRAPHY;  Surgeon: Nelva Bush, MD;  Location: [facility];  Service: Cardiovascular;  Laterality: N/A;  . TUBAL LIGATION      Family History  Problem Relation Age of Onset  . Diabetes Mother   . Hypertension Mother   . Aneurysm Mother   . Stroke Mother   . Cancer Mother        cervical cancer  . Cancer Maternal Aunt        breast  . Cancer Cousin        breast/breast  . Heart disease Neg Hx   . Colon cancer Neg Hx       ROS Review of Systems  Constitutional: Positive for fatigue (Mild). Negative for chills and fever.  HENT: Positive for congestion. Negative for ear pain (Ear pressure), sore throat and trouble swallowing.   Respiratory: Negative for cough and shortness of breath.   Cardiovascular: Positive for leg swelling. Negative for chest pain and palpitations.  Gastrointestinal: Negative for  abdominal pain, blood in stool, constipation, diarrhea and nausea.  Endocrine: Negative for cold intolerance, heat intolerance, polydipsia, polyphagia and polyuria.  Genitourinary: Negative for dysuria and frequency.  Musculoskeletal: Positive for arthralgias (Patient with right knee pain status post fall). Negative for back pain.  Neurological: Negative for dizziness and headaches.  Hematological: Negative for adenopathy. Does not bruise/bleed easily.  Psychiatric/Behavioral: Negative for self-injury and suicidal ideas. The patient is nervous/anxious (On medication which has helped).     Objective:   Today's Vitals: LMP 06/29/2015 (Exact Date)  BP (!) 155/90 (BP Location: Right Arm, Patient Position: Sitting,  Cuff Size: Large)   Pulse (!) 109   Temp 98 F (36.7 C) (Oral)   Ht 5\' 11"  (1.803 m)   Wt 245 lb 3.2 oz (111.2 kg)   LMP 06/29/2015 (Exact Date)   SpO2 95%   BMI 34.20 kg/m   Physical Exam Constitutional:      Appearance: Normal appearance. She is obese.  HENT:     Right Ear: Hearing normal. Tympanic membrane is erythematous.     Left Ear: Hearing and tympanic membrane normal.     Nose: Mucosal edema, congestion and rhinorrhea (Mild, clear) present.     Right Turbinates: Not swollen.     Left Turbinates: Not swollen.     Right Sinus: No maxillary sinus tenderness or frontal sinus tenderness.     Left Sinus: No maxillary sinus tenderness or frontal sinus tenderness.  Neck:     Musculoskeletal: Normal range of motion and neck supple.     Vascular: No carotid bruit.  Cardiovascular:     Rate and Rhythm: Normal rate and regular rhythm.  Pulmonary:     Effort: Pulmonary effort is normal.     Breath sounds: Normal breath sounds.  Abdominal:     Palpations: Abdomen is soft.     Tenderness: There is no abdominal tenderness. There is no right CVA tenderness, left CVA tenderness, guarding or rebound.  Musculoskeletal:        General: Tenderness (Mild right medial joint line tenderness) present. No deformity.     Right lower leg: Edema (Mild, non-pitting distal lower extremity edema) present.     Left lower leg: Edema (Mild, nonpitting distal lower extremity edema) present.  Lymphadenopathy:     Cervical: No cervical adenopathy.  Skin:    General: Skin is warm and dry.  Neurological:     General: No focal deficit present.     Mental Status: She is alert and oriented to person, place, and time.  Psychiatric:        Mood and Affect: Mood normal.        Behavior: Behavior normal.        Thought Content: Thought content normal.        Judgment: Judgment normal.     Assessment & Plan:  1. Essential hypertension Blood pressure elevated at today's visit and patient realizes that  she is out of hydrochlorothiazide.  Refill provided and she will continue her amlodipine, metoprolol and hydrochlorothiazide.  She is also asked to have her blood pressure checked periodically or return in approximately 2 weeks for nurse visit blood pressure recheck to make sure that her blood pressure is improved.  Discussed blood pressure goal of 130/80 as well as DASH diet - hydrochlorothiazide (HYDRODIURIL) 25 MG tablet; Take 1 tablet (25 mg total) by mouth daily. To lower blood pressure  Dispense: 90 tablet; Refill: 3  2. Prediabetes Low carbohydrate diet encouraged as well as regular  cardiovascular exercise and continue use of metformin.  Hemoglobin A1c repeated at today's visit and still consistent with prediabetes. - HgB A1c  3. Right otitis media, unspecified otitis media type Patient with evidence of right otitis media on exam.  Prescription provided for amoxicillin 500 mg twice daily x7 days and patient may take over-the-counter antihistamines as needed to help with congestion - amoxicillin (AMOXIL) 500 MG capsule; Take 1 capsule (500 mg total) by mouth 2 (two) times daily for 7 days.  Dispense: 14 capsule; Refill: 0   Outpatient Encounter Medications as of 04/22/2019  Medication Sig  . acyclovir (ZOVIRAX) 200 MG capsule Take 1 capsule by mouth twice daily  . albuterol (PROVENTIL HFA;VENTOLIN HFA) 108 (90 Base) MCG/ACT inhaler Inhale 1-2 puffs into the lungs every 6 (six) hours as needed for wheezing or shortness of breath.  Marland Kitchen amLODipine (NORVASC) 5 MG tablet Take 1 tablet by mouth once daily  . aspirin (EQ ASPIRIN ADULT LOW DOSE) 81 MG EC tablet Take 1 tablet (81 mg total) by mouth daily. Swallow whole.  Marland Kitchen buPROPion (WELLBUTRIN XL) 150 MG 24 hr tablet TAKE 1 TABLET BY MOUTH ONCE DAILY  . EPINEPHrine (EPIPEN 2-PAK) 0.3 mg/0.3 mL IJ SOAJ injection Inject 0.3 mLs (0.3 mg total) into the muscle once as needed for up to 1 dose (for severe allergic reaction). CAll 911 immediately if you have  to use this medicine  . Fluticasone-Salmeterol (ADVAIR) 250-50 MCG/DOSE AEPB Inhale 1 puff into the lungs 2 (two) times daily.  Marland Kitchen gabapentin (NEURONTIN) 300 MG capsule Take 300 mg by mouth at bedtime as needed (back pain).   . hydrochlorothiazide (MICROZIDE) 12.5 MG capsule Take 1 capsule by mouth once daily  . ibuprofen (ADVIL,MOTRIN) 200 MG tablet Take 800 mg by mouth every 6 (six) hours as needed for headache or moderate pain.   . metFORMIN (GLUCOPHAGE) 500 MG tablet Take 1 tablet (500 mg total) by mouth daily with breakfast.  . methocarbamol (ROBAXIN) 500 MG tablet Take 1 tablet (500 mg total) by mouth at bedtime as needed for muscle spasms.  . predniSONE (DELTASONE) 20 MG tablet 2 tabs po daily x 4 days  . PRILOSEC OTC 20 MG tablet Take 20 mg by mouth 2 (two) times daily.  . simvastatin (ZOCOR) 10 MG tablet Take 1 tablet by mouth once daily  . Vitamin D, Cholecalciferol, 25 MCG (1000 UT) TABS Take 1 tablet by mouth once daily   No facility-administered encounter medications on file as of 04/22/2019.    An After Visit Summary was printed and given to the patient.   Follow-up: Return in about 6 weeks (around 06/03/2019) for HTN follow-up.  Antony Blackbird, MD

## 2019-04-22 NOTE — Progress Notes (Signed)
Right foot swelling  HTN   Per pt she do not need any med refills right now due to having 2 months worth of meds for now

## 2019-04-30 ENCOUNTER — Encounter: Payer: Self-pay | Admitting: Family Medicine

## 2019-05-06 ENCOUNTER — Other Ambulatory Visit: Payer: Self-pay | Admitting: Medical

## 2019-05-12 ENCOUNTER — Other Ambulatory Visit: Payer: Self-pay | Admitting: Medical

## 2019-05-17 ENCOUNTER — Other Ambulatory Visit: Payer: Self-pay

## 2019-05-17 ENCOUNTER — Ambulatory Visit: Payer: Self-pay | Attending: Family Medicine | Admitting: *Deleted

## 2019-05-17 DIAGNOSIS — Z111 Encounter for screening for respiratory tuberculosis: Secondary | ICD-10-CM

## 2019-05-17 NOTE — Progress Notes (Signed)
Patient arrived ambulatory, alert and orientated to clinic.  Patient is in clinic for for TB test.  Test given.    Patient advised to return in 48-72 hours.   Patient acknowledged understanding of advice.

## 2019-05-18 ENCOUNTER — Emergency Department (HOSPITAL_COMMUNITY)
Admission: EM | Admit: 2019-05-18 | Discharge: 2019-05-18 | Disposition: A | Discharge status: Home / Self Care | Payer: Self-pay | Attending: Emergency Medicine | Admitting: Emergency Medicine

## 2019-05-18 ENCOUNTER — Emergency Department (HOSPITAL_COMMUNITY): Payer: Self-pay

## 2019-05-18 ENCOUNTER — Other Ambulatory Visit: Payer: Self-pay

## 2019-05-18 ENCOUNTER — Encounter (HOSPITAL_COMMUNITY): Payer: Self-pay | Admitting: Emergency Medicine

## 2019-05-18 DIAGNOSIS — M5441 Lumbago with sciatica, right side: Secondary | ICD-10-CM | POA: Insufficient documentation

## 2019-05-18 DIAGNOSIS — I10 Essential (primary) hypertension: Secondary | ICD-10-CM | POA: Insufficient documentation

## 2019-05-18 DIAGNOSIS — F17210 Nicotine dependence, cigarettes, uncomplicated: Secondary | ICD-10-CM | POA: Insufficient documentation

## 2019-05-18 DIAGNOSIS — E785 Hyperlipidemia, unspecified: Secondary | ICD-10-CM | POA: Insufficient documentation

## 2019-05-18 DIAGNOSIS — Z79899 Other long term (current) drug therapy: Secondary | ICD-10-CM | POA: Insufficient documentation

## 2019-05-18 DIAGNOSIS — E119 Type 2 diabetes mellitus without complications: Secondary | ICD-10-CM | POA: Insufficient documentation

## 2019-05-18 MED ORDER — PREDNISONE 20 MG PO TABS
60.0000 mg | ORAL_TABLET | Freq: Once | ORAL | Status: AC
  Administered 2019-05-18: 60 mg via ORAL
  Filled 2019-05-18: qty 3

## 2019-05-18 MED ORDER — TRAMADOL HCL 50 MG PO TABS: 50.0000 mg | ORAL_TABLET | Freq: Four times a day (QID) | ORAL | 0 refills | Status: DC | PRN

## 2019-05-18 MED ORDER — HYDROMORPHONE HCL 1 MG/ML IJ SOLN
1.0000 mg | Freq: Once | INTRAMUSCULAR | Status: AC
  Administered 2019-05-18: 1 mg via INTRAMUSCULAR
  Filled 2019-05-18: qty 1

## 2019-05-18 MED ORDER — PREDNISONE 20 MG PO TABS: ORAL_TABLET | ORAL | 0 refills | Status: DC

## 2019-05-18 MED FILL — predniSONE 20 MG TABS: 20 | 8 days supply | Qty: 15 | Fill #0

## 2019-05-18 MED FILL — traMADol HCL 50 MG TABS: 50 | 5 days supply | Qty: 20 | Fill #0

## 2019-05-18 NOTE — Discharge Instructions (Addendum)
It was our pleasure to provide your ER care today - we hope that you feel better.  Take prednisone as prescribed. You may take ultram as need for pain - no driving for the next 6 hours, or when taking ultram.  Follow up with your back/spine specialist in the next couple weeks - call office to arrange appointment.   Return to ER if worse, new symptoms, fevers, numbness/weakness, other concern.

## 2019-05-18 NOTE — ED Triage Notes (Signed)
Rt hip pain that rads to rt knee x 3 days ,burning pain,  Hurts all the time  Has taken muscle relaxers ,  No helping

## 2019-05-18 NOTE — ED Provider Notes (Addendum)
[facility] Provider Note   CSN: [id number] Arrival date & time: 05/18/19  1138     History   Chief Complaint Chief Complaint  Patient presents with  . Hip Pain    HPI Sydney Williams is a 53 y.o. female.     Patient c/o right low back pain radiating to right buttock and right upper leg. Symptoms acute onset for past 3 days, mod-sev, constant, persistent, radiating, worse w certain movements, bending and position change. Hx ddd. Denies specific injury or strain. No leg numbness/weakness. No saddle area numbness. No urinary retention or incontinence. No stool incontinence. Denies any anterior pain, no abd or pelvic pain. No fever or chills. Tried ibuprofen and flexeril without relief.  The history is provided by the patient.  Hip Pain Pertinent negatives include no chest pain, no abdominal pain, no headaches and no shortness of breath.    Past Medical History:  Diagnosis Date  . Asthma    2015 hospitaliation for asthma  . Diabetes mellitus    vs impaired fasting glucose  . Family history of breast cancer   . Fibroids 2010  . Genital herpes   . GERD (gastroesophageal reflux disease)   . Hyperlipidemia   . Hypertension   . Impaired fasting blood sugar   . Migraines   . Obesity   . Smoker     Patient Active Problem List   Diagnosis Date Noted  . Family history of cervical cancer 02/02/2018  . Family history of breast cancer 02/02/2018  . Vitamin D deficiency 02/02/2018  . Need for hepatitis C screening test 02/02/2018  . Low back pain 09/29/2017  . Abnormal myocardial perfusion study   . Depression 06/19/2017  . Hypokalemia 06/19/2017  . Hypomagnesemia 06/19/2017  . Vaccine counseling 01/08/2017  . 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 Screening for cervical cancer 01/08/2017  . Vision exam with abnormal findings 01/08/2017  . Snoring 09/11/2016  . History of migraine 09/11/2016  . Frequent headaches  09/11/2016  . Muscle cramp 09/11/2016  . Encounter for health maintenance examination in adult 12/04/2015  . Heel pain, bilateral 12/04/2015  . Screening for breast cancer 12/04/2015  . Need for influenza vaccination 12/04/2015  . Moderate persistent asthma 06/01/2015  . Gastroesophageal reflux disease without esophagitis 06/01/2015  . Rhinitis, allergic 06/01/2015  . Genital herpes 11/30/2014  . Smoker 11/30/2014  . Essential hypertension 11/30/2014  . Hyperlipidemia 11/30/2014  . Impaired fasting blood sugar 11/30/2014  . Obesity 07/02/2012    Past Surgical History:  Procedure Laterality Date  . COLONOSCOPY  03/13/2016   Dr. Carlean Purl, normal, repeat 2027  . CYST EXCISION     left neck/postauricular region, benign  . LAPAROSCOPIC ABDOMINAL EXPLORATION     removal of ectopic preg  . LEFT HEART CATH AND CORONARY ANGIOGRAPHY N/A 06/23/2017   Procedure: LEFT HEART CATH AND CORONARY ANGIOGRAPHY;  Surgeon: Nelva Bush, MD;  Location: [facility];  Service: Cardiovascular;  Laterality: N/A;  . TUBAL LIGATION       OB History    Gravida  5   Para  1   Term  1   Preterm      AB  2   Living  3     SAB  1   TAB      Ectopic  1   Multiple      Live Births               Home Medications  Prior to Admission medications   Medication Sig Start Date End Date Taking? Authorizing Provider  acyclovir (ZOVIRAX) 200 MG capsule Take 1 capsule by mouth twice daily 04/08/19   Tysinger, Camelia Eng, PA-C  albuterol (PROVENTIL HFA;VENTOLIN HFA) 108 (90 Base) MCG/ACT inhaler Inhale 1-2 puffs into the lungs every 6 (six) hours as needed for wheezing or shortness of breath. 02/03/18   Tysinger, Camelia Eng, PA-C  amLODipine (NORVASC) 5 MG tablet Take 1 tablet by mouth once daily 01/14/19   Tysinger, Camelia Eng, PA-C  aspirin (EQ ASPIRIN ADULT LOW DOSE) 81 MG EC tablet Take 1 tablet (81 mg total) by mouth daily. Swallow whole. 02/03/18   Tysinger, Camelia Eng, PA-C  buPROPion (WELLBUTRIN  XL) 150 MG 24 hr tablet TAKE 1 TABLET BY MOUTH ONCE DAILY 12/11/18   Tysinger, Camelia Eng, PA-C  EPINEPHrine (EPIPEN 2-PAK) 0.3 mg/0.3 mL IJ SOAJ injection Inject 0.3 mLs (0.3 mg total) into the muscle once as needed for up to 1 dose (for severe allergic reaction). CAll 911 immediately if you have to use this medicine 07/12/18   Baird Cancer, Vilinda Blanks, PA-C  Fluticasone-Salmeterol (ADVAIR) 250-50 MCG/DOSE AEPB Inhale 1 puff into the lungs 2 (two) times daily. 02/03/18   Tysinger, Camelia Eng, PA-C  gabapentin (NEURONTIN) 300 MG capsule Take 300 mg by mouth at bedtime as needed (back pain).  08/20/18   [provider]  hydrochlorothiazide (HYDRODIURIL) 25 MG tablet Take 1 tablet (25 mg total) by mouth daily. To lower blood pressure 04/22/19   Fulp, Cammie, MD  ibuprofen (ADVIL,MOTRIN) 200 MG tablet Take 800 mg by mouth every 6 (six) hours as needed for headache or moderate pain.     [provider]  metFORMIN (GLUCOPHAGE) 500 MG tablet Take 1 tablet (500 mg total) by mouth daily with breakfast. 02/03/18   Tysinger, Camelia Eng, PA-C  methocarbamol (ROBAXIN) 500 MG tablet Take 1 tablet (500 mg total) by mouth at bedtime as needed for muscle spasms. 09/29/17   Tysinger, Camelia Eng, PA-C  metoprolol tartrate (LOPRESSOR) 50 MG tablet Take 50 mg by mouth 2 (two) times daily.    [provider]  PRILOSEC OTC 20 MG tablet Take 20 mg by mouth 2 (two) times daily. 08/20/18   [provider]  simvastatin (ZOCOR) 10 MG tablet Take 1 tablet by mouth once daily 04/08/19   Tysinger, Camelia Eng, PA-C  Vitamin D, Cholecalciferol, 25 MCG (1000 UT) TABS Take 1 tablet by mouth once daily 02/18/19   Tysinger, Camelia Eng, PA-C    Family History Family History  Problem Relation Age of Onset  . Diabetes Mother   . Hypertension Mother   . Aneurysm Mother   . Stroke Mother   . Cancer Mother        cervical cancer  . Cancer Maternal Aunt        breast  . Cancer Cousin        breast/breast  . Heart disease Neg Hx   .  Colon cancer Neg Hx     Social History Social History   Tobacco Use  . Smoking status: Current Every Day Smoker    Packs/day: 0.25    Years: 14.00    Pack years: 3.50    Types: Cigarettes  . Smokeless tobacco: Never Used  Substance Use Topics  . Alcohol use: Yes    Comment: occ  . Drug use: No     Allergies   Lisinopril, Potassium chloride, Pneumococcal vaccines, and Vicodin [hydrocodone-acetaminophen]   Review of  Systems Review of Systems  Constitutional: Negative for chills and fever.  HENT: Negative for sore throat.   Eyes: Negative for redness.  Respiratory: Negative for cough and shortness of breath.   Cardiovascular: Negative for chest pain.  Gastrointestinal: Negative for abdominal pain.  Endocrine: Negative for polyuria.  Genitourinary: Negative for decreased urine volume and difficulty urinating.  Musculoskeletal: Positive for back pain. Negative for neck pain.  Skin: Negative for rash.  Neurological: Negative for weakness, numbness and headaches.  Hematological: Does not bruise/bleed easily.  Psychiatric/Behavioral: Negative for confusion.     Physical Exam Updated Vital Signs LMP 06/29/2015 (Exact Date)   Physical Exam Vitals signs and nursing note reviewed.  Constitutional:      Appearance: Normal appearance. She is well-developed.  HENT:     Head: Atraumatic.     Nose: Nose normal.     Mouth/Throat:     Mouth: Mucous membranes are moist.  Eyes:     General: No scleral icterus.    Conjunctiva/sclera: Conjunctivae normal.  Neck:     Musculoskeletal: Normal range of motion and neck supple. No neck rigidity or muscular tenderness.     Trachea: No tracheal deviation.  Cardiovascular:     Rate and Rhythm: Normal rate and regular rhythm.     Pulses: Normal pulses.     Heart sounds: Normal heart sounds. No murmur. No friction rub. No gallop.   Pulmonary:     Effort: Pulmonary effort is normal. No respiratory distress.     Breath sounds: Normal  breath sounds.  Abdominal:     General: Bowel sounds are normal. There is no distension.     Palpations: Abdomen is soft.     Tenderness: There is no abdominal tenderness.  Genitourinary:    Comments: No cva tenderness.  Musculoskeletal:        General: No swelling.     Right lower leg: No edema.     Left lower leg: No edema.     Comments: Lumbar tenderness, otherwise, CTLS spine, non tender, aligned, no step off.  + Right lower back muscular tenderness, tenderness at right sciatic notch. Good passive rom at right hip and knee without pain. Distal pulses palp.   Skin:    General: Skin is warm and dry.     Findings: No rash.  Neurological:     Mental Status: She is alert.     Comments: Alert, speech normal.  Lower ext, motor intact, stre 5/5. sens grossly intact. Symmetric reflexes. Steady gait.   Psychiatric:        Mood and Affect: Mood normal.      ED Treatments / Results  Labs (all labs ordered are listed, but only abnormal results are displayed) Labs Reviewed - No data to display  EKG None  Radiology Dg Lumbar Spine Complete  Result Date: 05/18/2019 CLINICAL DATA:  Right-sided low back pain with right lower extremity radiculopathy 3 days. No injury. EXAM: LUMBAR SPINE - COMPLETE 4+ VIEW COMPARISON:  11/06/2017 FINDINGS: Vertebral body alignment and heights are normal. There is mild spondylosis of the lumbar spine to include facet arthropathy throughout the lumbar spine. Moderate disc space narrowing at the L3-4 and L4-5 levels and to lesser extent at the L2-3 level. No compression fracture or spondylolisthesis. Remainder of the exam is unchanged. IMPRESSION: Mild spondylosis of the lumbar spine with moderate disc disease at the L3-4 and L4-5 levels and to lesser extent at the L2-3 level. Electronically Signed   By: Marin Olp M.D.  On: 05/18/2019 13:21    Procedures Procedures (including critical care time)  Medications Ordered in ED Medications  HYDROmorphone  (DILAUDID) injection 1 mg (has no administration in time range)  predniSONE (DELTASONE) tablet 60 mg (has no administration in time range)     Initial Impression / Assessment and Plan / ED Course  I have reviewed the triage vital signs and the nursing notes.  Pertinent labs & imaging results that were available during my care of the patient were reviewed by me and considered in my medical decision making (see chart for details).  Dilaudid 1 mg im. Prednisone po.  Reviewed nursing notes and prior charts for additional history.   Imaging ordered.   Xrays reviewed by me - ddd.   Discussed xrays w pt.   Pain is improved on recheck.  rx pred and ultram.  Pt indicates has plan to change pharmacy, unclear where new one - will give paper rx.   rec spine outpt f/u.  Return precautions provided.     Final Clinical Impressions(s) / ED Diagnoses   Final diagnoses:  None    ED Discharge Orders    None         Lajean Saver, MD 05/18/19 1356

## 2019-05-19 LAB — TB SKIN TEST
Induration: 0 mm
TB Skin Test: NEGATIVE

## 2019-05-20 ENCOUNTER — Other Ambulatory Visit: Payer: Self-pay | Admitting: Medical

## 2019-05-26 ENCOUNTER — Other Ambulatory Visit: Payer: Self-pay

## 2019-05-26 ENCOUNTER — Ambulatory Visit: Payer: Self-pay | Attending: Family Medicine

## 2019-06-03 ENCOUNTER — Ambulatory Visit: Payer: Self-pay | Attending: Family Medicine | Admitting: Family Medicine

## 2019-06-03 ENCOUNTER — Other Ambulatory Visit: Payer: Self-pay

## 2019-06-03 ENCOUNTER — Encounter: Payer: Self-pay | Admitting: Family Medicine

## 2019-06-03 VITALS — BP 162/98 | HR 90 | Temp 98.2°F | Ht 71.0 in | Wt 251.4 lb

## 2019-06-03 DIAGNOSIS — Z79899 Other long term (current) drug therapy: Secondary | ICD-10-CM

## 2019-06-03 DIAGNOSIS — I10 Essential (primary) hypertension: Secondary | ICD-10-CM

## 2019-06-03 DIAGNOSIS — R7303 Prediabetes: Secondary | ICD-10-CM

## 2019-06-03 MED ORDER — AMLODIPINE BESYLATE 10 MG PO TABS: 10.0000 mg | ORAL_TABLET | Freq: Every day | ORAL | 1 refills | Status: DC

## 2019-06-03 MED FILL — AMLODIPINE BESYLATE 10 MG T: 10 | 30 days supply | Qty: 30 | Fill #0

## 2019-06-03 NOTE — Patient Instructions (Signed)

## 2019-06-03 NOTE — Progress Notes (Signed)
Established Patient Office Visit  Subjective:  Patient ID: Sydney Williams, female    DOB: 05/18/1966  Age: 53 y.o. MRN: [id number]  CC:  Chief Complaint  Patient presents with  . Hypertension  . Medication Refill    HPI Sydney Williams presents for follow-up of hypertension and diagnosis of prediabetes as patient with hemoglobin A1c of 6.1 on 04/22/2019 at her visit to establish care.  Prior to her hemoglobin A1c on 04/22/2019, her last hemoglobin A1c was 6.0 on 02/02/2018.  At her last visit, patient had been taking her amlodipine and metoprolol but was out of hydrochlorothiazide and she was provided with a new prescription and patient is seen today in follow-up of hypertension to see if she has had improvement in her blood pressure.  She does not feel as if she is having any headaches or dizziness related to her blood pressure.  She reports that her ear infection from her last visit has resolved.       She denies any issues with increased thirst, urinary frequency or blurred vision related to her prediabetes.  She was prescribed metformin in 2019 per chart but she is not sure that she actually took the medication.  Past Medical History:  Diagnosis Date  . Asthma    2015 hospitaliation for asthma  . Diabetes mellitus    vs impaired fasting glucose  . Family history of breast cancer   . Fibroids 2010  . Genital herpes   . GERD (gastroesophageal reflux disease)   . Hyperlipidemia   . Hypertension   . Impaired fasting blood sugar   . Migraines   . Obesity   . Smoker     Past Surgical History:  Procedure Laterality Date  . COLONOSCOPY  03/13/2016   Dr. Carlean Purl, normal, repeat 2027  . CYST EXCISION     left neck/postauricular region, benign  . LAPAROSCOPIC ABDOMINAL EXPLORATION     removal of ectopic preg  . LEFT HEART CATH AND CORONARY ANGIOGRAPHY N/A 06/23/2017   Procedure: LEFT HEART CATH AND CORONARY ANGIOGRAPHY;  Surgeon: Nelva Bush, MD;  Location: [facility];  Service:  Cardiovascular;  Laterality: N/A;  . TUBAL LIGATION      Family History  Problem Relation Age of Onset  . Diabetes Mother   . Hypertension Mother   . Aneurysm Mother   . Stroke Mother   . Cancer Mother        cervical cancer  . Cancer Maternal Aunt        breast  . Cancer Cousin        breast/breast  . Heart disease Neg Hx   . Colon cancer Neg Hx     Social History   Tobacco Use  . Smoking status: Current Every Day Smoker    Packs/day: 0.25    Years: 14.00    Pack years: 3.50    Types: Cigarettes  . Smokeless tobacco: Never Used  Substance Use Topics  . Alcohol use: Yes    Comment: occ  . Drug use: No    Outpatient Medications Prior to Visit  Medication Sig Dispense Refill  . acyclovir (ZOVIRAX) 200 MG capsule Take 1 capsule by mouth twice daily 180 capsule 0  . albuterol (PROVENTIL HFA;VENTOLIN HFA) 108 (90 Base) MCG/ACT inhaler Inhale 1-2 puffs into the lungs every 6 (six) hours as needed for wheezing or shortness of breath. 1 Inhaler 1  . amLODipine (NORVASC) 5 MG tablet Take 1 tablet by mouth once daily 90 tablet 0  .  aspirin (EQ ASPIRIN ADULT LOW DOSE) 81 MG EC tablet Take 1 tablet (81 mg total) by mouth daily. Swallow whole. 90 tablet 3  . buPROPion (WELLBUTRIN XL) 150 MG 24 hr tablet TAKE 1 TABLET BY MOUTH ONCE DAILY 90 tablet 0  . EPINEPHrine (EPIPEN 2-PAK) 0.3 mg/0.3 mL IJ SOAJ injection Inject 0.3 mLs (0.3 mg total) into the muscle once as needed for up to 1 dose (for severe allergic reaction). CAll 911 immediately if you have to use this medicine 1 Device 1  . Fluticasone-Salmeterol (ADVAIR) 250-50 MCG/DOSE AEPB Inhale 1 puff into the lungs 2 (two) times daily. 60 each 11  . gabapentin (NEURONTIN) 300 MG capsule Take 300 mg by mouth at bedtime as needed (back pain).   0  . hydrochlorothiazide (HYDRODIURIL) 25 MG tablet Take 1 tablet (25 mg total) by mouth daily. To lower blood pressure 90 tablet 3  . ibuprofen (ADVIL,MOTRIN) 200 MG tablet Take 800 mg by mouth  every 6 (six) hours as needed for headache or moderate pain.     . metFORMIN (GLUCOPHAGE) 500 MG tablet Take 1 tablet (500 mg total) by mouth daily with breakfast. 90 tablet 3  . methocarbamol (ROBAXIN) 500 MG tablet Take 1 tablet (500 mg total) by mouth at bedtime as needed for muscle spasms. 20 tablet 0  . metoprolol tartrate (LOPRESSOR) 50 MG tablet Take 50 mg by mouth 2 (two) times daily.    . predniSONE (DELTASONE) 20 MG tablet 3 po once a day for 2 days, then 2 po once a day for 3 days, then 1 po once a day for 3 days 15 tablet 0  . PRILOSEC OTC 20 MG tablet Take 20 mg by mouth 2 (two) times daily.  2  . simvastatin (ZOCOR) 10 MG tablet Take 1 tablet by mouth once daily 90 tablet 0  . traMADol (ULTRAM) 50 MG tablet Take 1 tablet (50 mg total) by mouth every 6 (six) hours as needed. 20 tablet 0  . Vitamin D, Cholecalciferol, 25 MCG (1000 UT) TABS Take 1 tablet by mouth once daily 90 tablet 0   No facility-administered medications prior to visit.     Allergies  Allergen Reactions  . Lisinopril Swelling  . Potassium Chloride Shortness Of Breath and Swelling    Tolerates IV KCl, reaction only to PO product  . Pneumococcal Vaccines Swelling and Other (See Comments)    Reaction:  Swelling at injection site  . Vicodin [Hydrocodone-Acetaminophen] Itching and Rash    ROS Review of Systems  Constitutional: Positive for fatigue. Negative for chills and fever.  HENT: Negative for sore throat and trouble swallowing.   Respiratory: Negative for cough and shortness of breath.   Cardiovascular: Negative for chest pain, palpitations and leg swelling.  Gastrointestinal: Negative for abdominal pain, constipation, diarrhea and nausea.  Endocrine: Negative for polydipsia, polyphagia and polyuria.  Genitourinary: Negative for dysuria and frequency.  Musculoskeletal: Positive for arthralgias (recurrent knee pain s/p injury).  Neurological: Negative for dizziness and headaches.  Hematological:  Negative for adenopathy. Does not bruise/bleed easily.      Objective:    Physical Exam  Constitutional: She is oriented to person, place, and time. She appears well-developed and well-nourished.  Overweight for height/obese female in NAD  HENT:  Right Ear: Hearing, tympanic membrane, external ear and ear canal normal.  Left Ear: Hearing, tympanic membrane, external ear and ear canal normal.  Neck: Normal range of motion. Neck supple. No JVD present.  Cardiovascular: Normal rate and regular  rhythm.  Pulmonary/Chest: Effort normal and breath sounds normal.  Abdominal: Soft. There is no abdominal tenderness. There is no rebound and no guarding.  Musculoskeletal:        General: Tenderness present. No edema.     Comments: Right knee joint line tenderness  Lymphadenopathy:    She has no cervical adenopathy.  Neurological: She is alert and oriented to person, place, and time.  Skin: Skin is warm and dry.  Psychiatric: She has a normal mood and affect. Her behavior is normal.  Nursing note and vitals reviewed.   BP (!) 162/98 (BP Location: Right Arm, Patient Position: Sitting, Cuff Size: Large)   Pulse 90   Temp 100.2 F (37.9 C) (Oral)   Ht 5\' 11"  (1.803 m)   Wt 251 lb 6.4 oz (114 kg)   LMP 06/29/2015 (Exact Date)   SpO2 97%   BMI 35.06 kg/m  repeat temp 98.2 Wt Readings from Last 3 Encounters:  06/03/19 251 lb 6.4 oz (114 kg)  04/22/19 245 lb 3.2 oz (111.2 kg)  02/07/19 243 lb (110.2 kg)     Health Maintenance Due  Topic Date Due  . PNEUMOCOCCAL POLYSACCHARIDE VACCINE AGE 53-64 HIGH RISK  04/04/1967  . URINE MICROALBUMIN  01/08/2018  . FOOT EXAM  02/03/2019  . OPHTHALMOLOGY EXAM  02/03/2019  . INFLUENZA VACCINE  05/29/2019      Lab Results  Component Value Date   TSH 1.21 09/11/2016   Lab Results  Component Value Date   WBC 4.4 02/02/2018   HGB 13.4 02/02/2018   HCT 39.7 02/02/2018   MCV 98 (H) 02/02/2018   PLT 225 02/02/2018   Lab Results  Component  Value Date   NA 143 07/02/2017   K 4.0 07/02/2017   CO2 30 07/02/2017   GLUCOSE 78 07/02/2017   BUN 9 07/02/2017   CREATININE 0.54 07/02/2017   BILITOT 0.2 07/02/2017   ALKPHOS 76 03/10/2017   AST 18 07/02/2017   ALT 14 07/02/2017   PROT 7.4 07/02/2017   ALBUMIN 3.6 03/10/2017   CALCIUM 9.6 07/02/2017   ANIONGAP 7 06/22/2017   Lab Results  Component Value Date   CHOL 175 06/20/2017   Lab Results  Component Value Date   HDL 51 06/20/2017   Lab Results  Component Value Date   LDLCALC 62 06/20/2017   Lab Results  Component Value Date   TRIG 312 (H) 06/20/2017   Lab Results  Component Value Date   CHOLHDL 3.4 06/20/2017   Lab Results  Component Value Date   HGBA1C 6.1 (A) 04/22/2019      Assessment & Plan:  1. Essential hypertension Patient's blood pressure is higher than at her prior visit.  Patient's amlodipine will be increased to 10 mg daily and she will continue her hydrochlorothiazide as well as metoprolol daily.  DASH diet and weight loss encouraged.  She will have BMP in follow-up of her medication use for control of hypertension.  She is also being referred to the clinical pharmacist to have blood pressure recheck in the next few weeks. - amLODipine (NORVASC) 10 MG tablet; Take 1 tablet (10 mg total) by mouth daily. To lower blood pressure  Dispense: 90 tablet; Refill: 1 - Basic Metabolic Panel  2. Encounter for long-term (current) use of medications BMP in follow-up of long-term use of medications for the treatment of hypertension. - Basic Metabolic Panel  3. Prediabetes Patient with hemoglobin A1c of 6.1 at her recent visit.  She is encouraged to consider  restarting the use of metformin 500 mg daily or twice daily if tolerated in addition to a low carbohydrate diet.  Patient is also encouraged to have influenza immunization this fall. - Basic Metabolic Panel   An After Visit Summary was printed and given to the patient.  Follow-up: Return in about 2  months (around 08/03/2019) for BP- nurse visit in 2 weeks; HTN/prediabetes.   Antony Blackbird, MD

## 2019-06-04 LAB — BASIC METABOLIC PANEL WITH GFR
BUN/Creatinine Ratio: 18 (ref 9–23)
BUN: 12 mg/dL (ref 6–24)
CO2: 24 mmol/L (ref 20–29)
Calcium: 9.8 mg/dL (ref 8.7–10.2)
Chloride: 97 mmol/L (ref 96–106)
Creatinine, Ser: 0.65 mg/dL (ref 0.57–1.00)
GFR calc Af Amer: 116 mL/min/1.73
GFR calc non Af Amer: 101 mL/min/1.73
Glucose: 119 mg/dL — ABNORMAL HIGH (ref 65–99)
Potassium: 3.7 mmol/L (ref 3.5–5.2)
Sodium: 144 mmol/L (ref 134–144)

## 2019-06-09 ENCOUNTER — Other Ambulatory Visit: Payer: Self-pay | Admitting: Medical

## 2019-06-10 ENCOUNTER — Other Ambulatory Visit: Payer: Self-pay | Admitting: Family Medicine

## 2019-06-10 ENCOUNTER — Telehealth: Payer: Self-pay

## 2019-06-10 DIAGNOSIS — I10 Essential (primary) hypertension: Secondary | ICD-10-CM

## 2019-06-10 MED ORDER — METOPROLOL TARTRATE 50 MG PO TABS: 50.0000 mg | ORAL_TABLET | Freq: Two times a day (BID) | ORAL | 2 refills | Status: DC

## 2019-06-10 NOTE — Telephone Encounter (Signed)
1) Medication(s) Requested (by name): METROPROLOL 50 MG TAB take one tab my mouth twice daily  2) Pharmacy of Choice: [facility]   3) Special Requests:  Pt transferred care to [facility]. Please prescribe for pt   Approved medications will be sent to the pharmacy, we will reach out if there is an issue.  Requests made after 3pm may not be addressed until the following business day!  If a patient is unsure of the name of the medication(s) please note and ask patient to call back when they are able to provide all info, do not send to responsible party until all information is available!

## 2019-06-10 NOTE — Progress Notes (Signed)
Patient ID: Sydney Williams, female   DOB: Mar 29, 1966, 53 y.o.   MRN: [id number]   Patient left message that she was previously on metoprolol 50 mg twice daily for her blood pressure and would like a new RX for this medication sent to [facility]

## 2019-06-10 NOTE — Telephone Encounter (Signed)
Per pt another provider prescribed her Metoprolol for her and she's coming here now and would like provider here to please refill it for her.

## 2019-06-10 NOTE — Telephone Encounter (Signed)
Notify patient that RX for metoprolol 50 mg twice daily was sent to [facility]

## 2019-06-11 ENCOUNTER — Telehealth: Payer: Self-pay | Admitting: Family Medicine

## 2019-06-11 MED FILL — METOPROLOL TARTRATE 50 MG T: 50 | 30 days supply | Qty: 60 | Fill #0

## 2019-06-11 NOTE — Telephone Encounter (Signed)
Informed patient with results and she verbalized understanding.  

## 2019-06-11 NOTE — Telephone Encounter (Signed)
I received some documents for this Pt, after I sent back the application since I did not got before the dateline, Am unable to process these documents since the application has been sent back to the Pt. So also am sending back the documents she drop up, Pt need to schedule a new financial appt and submit the application and all the documents complete for the appt

## 2019-06-14 ENCOUNTER — Telehealth: Payer: Self-pay | Admitting: Family Medicine

## 2019-06-14 NOTE — Telephone Encounter (Signed)
Pt was called back and reschedule a new financial appt for 06/25/19

## 2019-06-14 NOTE — Telephone Encounter (Signed)
New Message   Pt calling states that she turned in all of her paperwork but received a letter in the mail that she did not turn it in. Please f/u

## 2019-06-18 ENCOUNTER — Encounter: Payer: Self-pay | Admitting: Pharmacist

## 2019-06-18 ENCOUNTER — Ambulatory Visit: Payer: Self-pay | Attending: Family Medicine | Admitting: Pharmacist

## 2019-06-18 ENCOUNTER — Telehealth: Payer: Self-pay | Admitting: Family Medicine

## 2019-06-18 ENCOUNTER — Other Ambulatory Visit: Payer: Self-pay

## 2019-06-18 VITALS — BP 152/78 | HR 97

## 2019-06-18 DIAGNOSIS — I10 Essential (primary) hypertension: Secondary | ICD-10-CM

## 2019-06-18 NOTE — Progress Notes (Signed)
   S:    PCP: Dr. Chapman Fitch  Patient arrives in good spirits. Presents to the clinic for BP check. Patient was referred and last seen by Primary Care Provider on 06/03/19. BP was 162/98 - Dr. Chapman Fitch increased amlodipine.   Patient reports adherence with medications.  Current BP Medications include:  Amlodipine 10 mg daily, HCTZ 25 mg daily, metoprolol tartrate 50 mg BID  Antihypertensives tried in the past include: lisinopril (swelling)  Dietary habits include: noncompliant with salt restriction; denies intake of caffeine Exercise habits include: mainly through her work; minimal outside of work Family / Social history:  - DM, HTN, aneurysm, stroke, cancer (mother) - Current every day smoker (0.25 PPD) - Alcohol: occasionally   O:  L arm after 5 minutes rest: 152/78, HR 97 (smoked cigarette before seeing me today)   Home BP readings: does not check  Last 3 Office BP readings: BP Readings from Last 3 Encounters:  06/18/19 (!) 152/78  06/03/19 (!) 162/98  05/18/19 (!) 143/87   BMET    Component Value Date/Time   NA 144 06/03/2019 1125   K 3.7 06/03/2019 1125   CL 97 06/03/2019 1125   CO2 24 06/03/2019 1125   GLUCOSE 119 (H) 06/03/2019 1125   GLUCOSE 78 07/02/2017 1136   BUN 12 06/03/2019 1125   CREATININE 0.65 06/03/2019 1125   CREATININE 0.54 07/02/2017 1136   CALCIUM 9.8 06/03/2019 1125   GFRNONAA 101 06/03/2019 1125   GFRAA 116 06/03/2019 1125   Renal function: CrCl cannot be calculated (Unknown ideal weight.).  Clinical ASCVD: No  The 10-year ASCVD risk score Mikey Bussing DC Jr., et al., 2013) is: 33.1%   Values used to calculate the score:     Age: 17 years     Sex: Female     Is Non-Hispanic African American: Yes     Diabetic: Yes     Tobacco smoker: Yes     Systolic Blood Pressure: 0000000 mmHg     Is BP treated: Yes     HDL Cholesterol: 51 mg/dL     Total Cholesterol: 175 mg/dL  A/P: Hypertension longstanding currently uncontrolled on current medications. BP Goal =  <130/80 mmHg. Patient is adherent with current medications. Will have her return in 2 weeks for recheck. Pt instructed to NOT SMOKE before seeing me for next BP check.  -Continued current regimen. -Counseled on lifestyle modifications for blood pressure control including reduced dietary sodium, increased exercise, adequate sleep  Results reviewed and written information provided. Total time in face-to-face counseling 15 minutes.   F/U Clinic Visit in 2 weeks.  Benard Halsted, PharmD, [place] [phone]

## 2019-06-24 ENCOUNTER — Emergency Department (HOSPITAL_COMMUNITY)
Admission: EM | Admit: 2019-06-24 | Discharge: 2019-06-24 | Disposition: A | Discharge status: Home / Self Care | Payer: Self-pay | Attending: Emergency Medicine | Admitting: Emergency Medicine

## 2019-06-24 ENCOUNTER — Other Ambulatory Visit: Payer: Self-pay

## 2019-06-24 DIAGNOSIS — G8929 Other chronic pain: Secondary | ICD-10-CM | POA: Insufficient documentation

## 2019-06-24 DIAGNOSIS — Z79899 Other long term (current) drug therapy: Secondary | ICD-10-CM | POA: Insufficient documentation

## 2019-06-24 DIAGNOSIS — F17210 Nicotine dependence, cigarettes, uncomplicated: Secondary | ICD-10-CM | POA: Insufficient documentation

## 2019-06-24 DIAGNOSIS — Z8709 Personal history of other diseases of the respiratory system: Secondary | ICD-10-CM | POA: Insufficient documentation

## 2019-06-24 DIAGNOSIS — M5441 Lumbago with sciatica, right side: Secondary | ICD-10-CM | POA: Insufficient documentation

## 2019-06-24 DIAGNOSIS — M5442 Lumbago with sciatica, left side: Secondary | ICD-10-CM | POA: Insufficient documentation

## 2019-06-24 DIAGNOSIS — Z7982 Long term (current) use of aspirin: Secondary | ICD-10-CM | POA: Insufficient documentation

## 2019-06-24 DIAGNOSIS — I10 Essential (primary) hypertension: Secondary | ICD-10-CM | POA: Insufficient documentation

## 2019-06-24 MED ORDER — METHYLPREDNISOLONE 4 MG PO TBPK: ORAL_TABLET | ORAL | 0 refills | Status: DC

## 2019-06-24 MED FILL — METHYLPREDNISOLONE 4 MG TBP: 4 | 6 days supply | Qty: 21 | Fill #0

## 2019-06-24 NOTE — ED Notes (Signed)
Patient verbalizes understanding of discharge instructions. Opportunity for questioning and answers were provided. Armband removed by staff, pt discharged from ED home via POV.  

## 2019-06-24 NOTE — Telephone Encounter (Signed)
pharmacy did not receive order for medrol

## 2019-06-24 NOTE — Discharge Instructions (Signed)
SEEK IMMEDIATE MEDICAL ATTENTION IF: New numbness, tingling, weakness, or problem with the use of your arms or legs.  Severe back pain not relieved with medications.  Change in bowel or bladder control.  Increasing pain in any areas of the body (such as chest or abdominal pain).  Shortness of breath, dizziness or fainting.  Nausea (feeling sick to your stomach), vomiting, fever, or sweats.  

## 2019-06-24 NOTE — ED Triage Notes (Signed)
Pt reports increasingly worse lower back pain x 2 days, states it is a constant throbbing pain that radiates to both legs.  States she has been taking toradol, ibuprofen and a muscle relaxer with no relief.

## 2019-06-24 NOTE — ED Provider Notes (Signed)
[facility] EMERGENCY DEPARTMENT Provider Note   CSN: [id number] Arrival date & time: 06/24/19  6387     History   Chief Complaint Chief Complaint  Patient presents with  . Back Pain    HPI Sydney Williams is a 53 y.o. female with chronic low back pain and documented degenerative disc disease who presents today with a 2-day history of worsening low back pain and radicular symptoms.  She states that she continues to take her ibuprofen, Flexeril, and tramadol, but with little relief these past couple of days.  She reports that she met with a surgeon who told her that the discectomy was too risky given sciatic nerve impingement.  She states that she received physical therapy a few years ago with no relief.  She denies any fever, chills, recent illness, abdominal pain, bowel incontinence, lower extremity numbness, weakness, or saddle anesthesia.  She states that her symptoms are aggravated with movement and at her low back pain radiates down both legs.  Denies any pelvic or abdominal pain.  She works as a [employer]ar[employer] manager.  Admits to tobacco use, but states that she is trying  to quit.  She has lost weight and attributes it to her diet.  Appetite remains intact.     HPI  Past Medical History:  Diagnosis Date  . Asthma    2015 hospitaliation for asthma  . Diabetes mellitus    vs impaired fasting glucose  . Family history of breast cancer   . Fibroids 2010  . Genital herpes   . GERD (gastroesophageal reflux disease)   . Hyperlipidemia   . Hypertension   . Impaired fasting blood sugar   . Migraines   . Obesity   . Smoker     Patient Active Problem List   Diagnosis Date Noted  . Family history of cervical cancer 02/02/2018  . Family history of breast cancer 02/02/2018  . Vitamin D deficiency 02/02/2018  . Need for hepatitis C screening test 02/02/2018  . Low back pain 09/29/2017  . Abnormal myocardial perfusion study   . Depression 06/19/2017  . Hypokalemia 06/19/2017  .  Hypomagnesemia 06/19/2017  . Vaccine counseling 01/08/2017  . 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 Screening for cervical cancer 01/08/2017  . Vision exam with abnormal findings 01/08/2017  . Snoring 09/11/2016  . History of migraine 09/11/2016  . Frequent headaches 09/11/2016  . Muscle cramp 09/11/2016  . Encounter for health maintenance examination in adult 12/04/2015  . Heel pain, bilateral 12/04/2015  . Screening for breast cancer 12/04/2015  . Need for influenza vaccination 12/04/2015  . Moderate persistent asthma 06/01/2015  . Gastroesophageal reflux disease without esophagitis 06/01/2015  . Rhinitis, allergic 06/01/2015  . Genital herpes 11/30/2014  . Smoker 11/30/2014  . Essential hypertension 11/30/2014  . Hyperlipidemia 11/30/2014  . Impaired fasting blood sugar 11/30/2014  . Obesity 07/02/2012    Past Surgical History:  Procedure Laterality Date  . COLONOSCOPY  03/13/2016   Dr. Carlean Purl, normal, repeat 2027  . CYST EXCISION     left neck/postauricular region, benign  . LAPAROSCOPIC ABDOMINAL EXPLORATION     removal of ectopic preg  . LEFT HEART CATH AND CORONARY ANGIOGRAPHY N/A 06/23/2017   Procedure: LEFT HEART CATH AND CORONARY ANGIOGRAPHY;  Surgeon: Nelva Bush, MD;  Location: [facility];  Service: Cardiovascular;  Laterality: N/A;  . TUBAL LIGATION       OB History    Gravida  5   Para  1  Term  1   Preterm      AB  2   Living  3     SAB  1   TAB      Ectopic  1   Multiple      Live Births               Home Medications    Prior to Admission medications   Medication Sig Start Date End Date Taking? Authorizing Provider  acyclovir (ZOVIRAX) 200 MG capsule Take 1 capsule by mouth twice daily 04/08/19   Tysinger, Camelia Eng, PA-C  albuterol (PROVENTIL HFA;VENTOLIN HFA) 108 (90 Base) MCG/ACT inhaler Inhale 1-2 puffs into the lungs every 6 (six) hours as needed for wheezing or shortness of breath. 02/03/18    Tysinger, Camelia Eng, PA-C  amLODipine (NORVASC) 10 MG tablet Take 1 tablet (10 mg total) by mouth daily. To lower blood pressure 06/03/19   Fulp, Cammie, MD  aspirin (EQ ASPIRIN ADULT LOW DOSE) 81 MG EC tablet Take 1 tablet (81 mg total) by mouth daily. Swallow whole. 02/03/18   Tysinger, Camelia Eng, PA-C  buPROPion (WELLBUTRIN XL) 150 MG 24 hr tablet TAKE 1 TABLET BY MOUTH ONCE DAILY 12/11/18   Tysinger, Camelia Eng, PA-C  EPINEPHrine (EPIPEN 2-PAK) 0.3 mg/0.3 mL IJ SOAJ injection Inject 0.3 mLs (0.3 mg total) into the muscle once as needed for up to 1 dose (for severe allergic reaction). CAll 911 immediately if you have to use this medicine 07/12/18   Baird Cancer, Vilinda Blanks, PA-C  Fluticasone-Salmeterol (ADVAIR) 250-50 MCG/DOSE AEPB Inhale 1 puff into the lungs 2 (two) times daily. 02/03/18   Tysinger, Camelia Eng, PA-C  gabapentin (NEURONTIN) 300 MG capsule Take 300 mg by mouth at bedtime as needed (back pain).  08/20/18   [provider]  hydrochlorothiazide (HYDRODIURIL) 25 MG tablet Take 1 tablet (25 mg total) by mouth daily. To lower blood pressure 04/22/19   Fulp, Cammie, MD  ibuprofen (ADVIL,MOTRIN) 200 MG tablet Take 800 mg by mouth every 6 (six) hours as needed for headache or moderate pain.     [provider]  metFORMIN (GLUCOPHAGE) 500 MG tablet Take 1 tablet (500 mg total) by mouth daily with breakfast. 02/03/18   Tysinger, Camelia Eng, PA-C  methocarbamol (ROBAXIN) 500 MG tablet Take 1 tablet (500 mg total) by mouth at bedtime as needed for muscle spasms. 09/29/17   Tysinger, Camelia Eng, PA-C  methylPREDNISolone (MEDROL DOSEPAK) 4 MG TBPK tablet Use as directed. 06/24/19   Corena Herter, PA-C  metoprolol tartrate (LOPRESSOR) 50 MG tablet Take 1 tablet (50 mg total) by mouth 2 (two) times daily. 06/10/19   Fulp, Cammie, MD  predniSONE (DELTASONE) 20 MG tablet 3 po once a day for 2 days, then 2 po once a day for 3 days, then 1 po once a day for 3 days 05/19/19   Lajean Saver, MD  PRILOSEC OTC 20 MG tablet  Take 20 mg by mouth 2 (two) times daily. 08/20/18   [provider]  simvastatin (ZOCOR) 10 MG tablet Take 1 tablet by mouth once daily 04/08/19   Tysinger, Camelia Eng, PA-C  traMADol (ULTRAM) 50 MG tablet Take 1 tablet (50 mg total) by mouth every 6 (six) hours as needed. 05/18/19   Lajean Saver, MD  Vitamin D, Cholecalciferol, 25 MCG (1000 UT) TABS Take 1 tablet by mouth once daily 02/18/19   Tysinger, Camelia Eng, PA-C    Family History Family History  Problem Relation Age of Onset  .  Diabetes Mother   . Hypertension Mother   . Aneurysm Mother   . Stroke Mother   . Cancer Mother        cervical cancer  . Cancer Maternal Aunt        breast  . Cancer Cousin        breast/breast  . Heart disease Neg Hx   . Colon cancer Neg Hx     Social History Social History   Tobacco Use  . Smoking status: Current Every Day Smoker    Packs/day: 0.25    Years: 14.00    Pack years: 3.50    Types: Cigarettes  . Smokeless tobacco: Never Used  Substance Use Topics  . Alcohol use: Yes    Comment: occ  . Drug use: No     Allergies   Lisinopril, Potassium chloride, Pneumococcal vaccines, and Vicodin [hydrocodone-acetaminophen]   Review of Systems Review of Systems Ten systems are reviewed and are negative for acute change except as noted in the HPI   Physical Exam Updated Vital Signs BP (!) 139/93 (BP Location: Right Arm)   Pulse 85   Temp 98.3 F (36.8 C) (Oral)   Resp 16   LMP 06/29/2015 (Exact Date)   SpO2 94%   Physical Exam Constitutional:      Appearance: Normal appearance. She is obese.  HENT:     Head: Normocephalic and atraumatic.  Eyes:     Pupils: Pupils are equal, round, and reactive to light.  Cardiovascular:     Rate and Rhythm: Normal rate and regular rhythm.     Pulses: Normal pulses.  Pulmonary:     Effort: Pulmonary effort is normal.     Breath sounds: Normal breath sounds.  Abdominal:     General: Abdomen is flat.     Palpations: Abdomen is soft.   Musculoskeletal:     Comments: Positive straight leg raise bilaterally.  No thoracic or cervical bony tenderness.  Lumbar area is tender with palpation.  No gross deformity, swelling, or redness.  Neurological:     Mental Status: She is alert and oriented to person, place, and time.     Comments: Strength and sensation intact bilaterally.  No saddle anesthesia.  Psychiatric:        Mood and Affect: Mood normal.        Behavior: Behavior normal.        Thought Content: Thought content normal.      ED Treatments / Results  Labs (all labs ordered are listed, but only abnormal results are displayed) Labs Reviewed - No data to display  EKG None  Radiology No results found.  Procedures Procedures (including critical care time)  Medications Ordered in ED Medications - No data to display   Initial Impression / Assessment and Plan / ED Course  I have reviewed the triage vital signs and the nursing notes.  Pertinent labs & imaging results that were available during my care of the patient were reviewed by me and considered in my medical decision making (see chart for details).      Patient's history and presentation consistent with acute on chronic low back pain with radicular symptoms, secondary to documented degenerative disc disease.  No red flag signs or symptoms. No urinary symptoms. Will prescribe prednisone taper and asked that she continue her other medications as prescribed.  Outpatient follow-up. Encouraged appropriate stretching exercises.   The patient was counseled on the dangers of tobacco use, and was advised to quit.  Reviewed  strategies to maximize success, including removing cigarettes and smoking materials from environment, stress management, substitution of other forms of reinforcement, support of family/friends and written materials.   Final Clinical Impressions(s) / ED Diagnoses   Final diagnoses:  Chronic low back pain with bilateral sciatica, unspecified  back pain laterality    ED Discharge Orders         Ordered    methylPREDNISolone (MEDROL DOSEPAK) 4 MG TBPK tablet     06/24/19 1026           Reita Chard 06/24/19 1026    Sherwood Gambler, MD 06/25/19 1106

## 2019-06-25 ENCOUNTER — Other Ambulatory Visit: Payer: Self-pay

## 2019-06-25 ENCOUNTER — Emergency Department (HOSPITAL_COMMUNITY)
Admission: EM | Admit: 2019-06-25 | Discharge: 2019-06-25 | Disposition: A | Discharge status: Home / Self Care | Payer: Self-pay | Attending: Emergency Medicine | Admitting: Emergency Medicine

## 2019-06-25 ENCOUNTER — Ambulatory Visit: Payer: Self-pay | Attending: Family Medicine

## 2019-06-25 DIAGNOSIS — E119 Type 2 diabetes mellitus without complications: Secondary | ICD-10-CM | POA: Insufficient documentation

## 2019-06-25 DIAGNOSIS — F17210 Nicotine dependence, cigarettes, uncomplicated: Secondary | ICD-10-CM | POA: Insufficient documentation

## 2019-06-25 DIAGNOSIS — M5442 Lumbago with sciatica, left side: Secondary | ICD-10-CM | POA: Insufficient documentation

## 2019-06-25 DIAGNOSIS — G8929 Other chronic pain: Secondary | ICD-10-CM

## 2019-06-25 DIAGNOSIS — J45909 Unspecified asthma, uncomplicated: Secondary | ICD-10-CM | POA: Insufficient documentation

## 2019-06-25 DIAGNOSIS — Z7984 Long term (current) use of oral hypoglycemic drugs: Secondary | ICD-10-CM | POA: Insufficient documentation

## 2019-06-25 DIAGNOSIS — M5432 Sciatica, left side: Secondary | ICD-10-CM

## 2019-06-25 DIAGNOSIS — Z79899 Other long term (current) drug therapy: Secondary | ICD-10-CM | POA: Insufficient documentation

## 2019-06-25 DIAGNOSIS — I10 Essential (primary) hypertension: Secondary | ICD-10-CM | POA: Insufficient documentation

## 2019-06-25 DIAGNOSIS — M5441 Lumbago with sciatica, right side: Secondary | ICD-10-CM | POA: Insufficient documentation

## 2019-06-25 DIAGNOSIS — M5431 Sciatica, right side: Secondary | ICD-10-CM

## 2019-06-25 DIAGNOSIS — Z7982 Long term (current) use of aspirin: Secondary | ICD-10-CM | POA: Insufficient documentation

## 2019-06-25 NOTE — ED Triage Notes (Signed)
Patient returns to ED today c/o persistent chronic lower back pain radiating to both legs - seen yesterday for same and she was prescribed steroid pack that she states she didn't fill because the pharmacy told her they never received it. No relief with at home pain meds. Patient tearful, denies new numbness/tingling or weakness. Ambulatory with steady gait.

## 2019-06-25 NOTE — ED Provider Notes (Signed)
[facility] EMERGENCY DEPARTMENT Provider Note   CSN: [id number] Arrival date & time: 06/25/19  0759     History   Chief Complaint Chief Complaint  Patient presents with  . Back Pain    HPI Sydney Williams is a 53 y.o. female with PMHx HTN, HLD, Diabetes, GERD, and chronic back pain with DDD who presents to the ED today complaining of unchanged lower back pain with radiation down BLEs. Pt was seen in the ED yesterday for same and prescribed prednisone. She reports she went to her pharmacy but they told her they never received the prescription so pt never took it. She reports she has been taking Tramadol, muscle relaxers, and Ibuprofen/Tylenol PRN for pain without relief. Denies fever, chills, saddle anesthesia, urinary retention, urinary or bowel incontinence, or any other associated symptoms. No hx IVDA. No hx chronic steroid use. No recent spinal manipulation.        Past Medical History:  Diagnosis Date  . Asthma    2015 hospitaliation for asthma  . Diabetes mellitus    vs impaired fasting glucose  . Family history of breast cancer   . Fibroids 2010  . Genital herpes   . GERD (gastroesophageal reflux disease)   . Hyperlipidemia   . Hypertension   . Impaired fasting blood sugar   . Migraines   . Obesity   . Smoker     Patient Active Problem List   Diagnosis Date Noted  . Family history of cervical cancer 02/02/2018  . Family history of breast cancer 02/02/2018  . Vitamin D deficiency 02/02/2018  . Need for hepatitis C screening test 02/02/2018  . Low back pain 09/29/2017  . Abnormal myocardial perfusion study   . Depression 06/19/2017  . Hypokalemia 06/19/2017  . Hypomagnesemia 06/19/2017  . Vaccine counseling 01/08/2017  . 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 Screening for cervical cancer 01/08/2017  . Vision exam with abnormal findings 01/08/2017  . Snoring 09/11/2016  . History of migraine 09/11/2016  . Frequent headaches  09/11/2016  . Muscle cramp 09/11/2016  . Encounter for health maintenance examination in adult 12/04/2015  . Heel pain, bilateral 12/04/2015  . Screening for breast cancer 12/04/2015  . Need for influenza vaccination 12/04/2015  . Moderate persistent asthma 06/01/2015  . Gastroesophageal reflux disease without esophagitis 06/01/2015  . Rhinitis, allergic 06/01/2015  . Genital herpes 11/30/2014  . Smoker 11/30/2014  . Essential hypertension 11/30/2014  . Hyperlipidemia 11/30/2014  . Impaired fasting blood sugar 11/30/2014  . Obesity 07/02/2012    Past Surgical History:  Procedure Laterality Date  . COLONOSCOPY  03/13/2016   Dr. Carlean Purl, normal, repeat 2027  . CYST EXCISION     left neck/postauricular region, benign  . LAPAROSCOPIC ABDOMINAL EXPLORATION     removal of ectopic preg  . LEFT HEART CATH AND CORONARY ANGIOGRAPHY N/A 06/23/2017   Procedure: LEFT HEART CATH AND CORONARY ANGIOGRAPHY;  Surgeon: Nelva Bush, MD;  Location: [facility];  Service: Cardiovascular;  Laterality: N/A;  . TUBAL LIGATION       OB History    Gravida  5   Para  1   Term  1   Preterm      AB  2   Living  3     SAB  1   TAB      Ectopic  1   Multiple      Live Births  Home Medications    Prior to Admission medications   Medication Sig Start Date End Date Taking? Authorizing Provider  acyclovir (ZOVIRAX) 200 MG capsule Take 1 capsule by mouth twice daily 04/08/19   Tysinger, Camelia Eng, PA-C  albuterol (PROVENTIL HFA;VENTOLIN HFA) 108 (90 Base) MCG/ACT inhaler Inhale 1-2 puffs into the lungs every 6 (six) hours as needed for wheezing or shortness of breath. 02/03/18   Tysinger, Camelia Eng, PA-C  amLODipine (NORVASC) 10 MG tablet Take 1 tablet (10 mg total) by mouth daily. To lower blood pressure 06/03/19   Fulp, Cammie, MD  aspirin (EQ ASPIRIN ADULT LOW DOSE) 81 MG EC tablet Take 1 tablet (81 mg total) by mouth daily. Swallow whole. 02/03/18   Tysinger, Camelia Eng,  PA-C  buPROPion (WELLBUTRIN XL) 150 MG 24 hr tablet TAKE 1 TABLET BY MOUTH ONCE DAILY 12/11/18   Tysinger, Camelia Eng, PA-C  EPINEPHrine (EPIPEN 2-PAK) 0.3 mg/0.3 mL IJ SOAJ injection Inject 0.3 mLs (0.3 mg total) into the muscle once as needed for up to 1 dose (for severe allergic reaction). CAll 911 immediately if you have to use this medicine 07/12/18   Baird Cancer, Vilinda Blanks, PA-C  Fluticasone-Salmeterol (ADVAIR) 250-50 MCG/DOSE AEPB Inhale 1 puff into the lungs 2 (two) times daily. 02/03/18   Tysinger, Camelia Eng, PA-C  gabapentin (NEURONTIN) 300 MG capsule Take 300 mg by mouth at bedtime as needed (back pain).  08/20/18   [provider]  hydrochlorothiazide (HYDRODIURIL) 25 MG tablet Take 1 tablet (25 mg total) by mouth daily. To lower blood pressure 04/22/19   Fulp, Cammie, MD  ibuprofen (ADVIL,MOTRIN) 200 MG tablet Take 800 mg by mouth every 6 (six) hours as needed for headache or moderate pain.     [provider]  metFORMIN (GLUCOPHAGE) 500 MG tablet Take 1 tablet (500 mg total) by mouth daily with breakfast. 02/03/18   Tysinger, Camelia Eng, PA-C  methocarbamol (ROBAXIN) 500 MG tablet Take 1 tablet (500 mg total) by mouth at bedtime as needed for muscle spasms. 09/29/17   Tysinger, Camelia Eng, PA-C  methylPREDNISolone (MEDROL DOSEPAK) 4 MG TBPK tablet Use as directed. 06/24/19   Corena Herter, PA-C  methylPREDNISolone (MEDROL DOSEPAK) 4 MG TBPK tablet Use as directed. 06/24/19   Corena Herter, PA-C  metoprolol tartrate (LOPRESSOR) 50 MG tablet Take 1 tablet (50 mg total) by mouth 2 (two) times daily. 06/10/19   Fulp, Cammie, MD  predniSONE (DELTASONE) 20 MG tablet 3 po once a day for 2 days, then 2 po once a day for 3 days, then 1 po once a day for 3 days 05/19/19   Lajean Saver, MD  PRILOSEC OTC 20 MG tablet Take 20 mg by mouth 2 (two) times daily. 08/20/18   [provider]  simvastatin (ZOCOR) 10 MG tablet Take 1 tablet by mouth once daily 04/08/19   Tysinger, Camelia Eng, PA-C  traMADol  (ULTRAM) 50 MG tablet Take 1 tablet (50 mg total) by mouth every 6 (six) hours as needed. 05/18/19   Lajean Saver, MD  Vitamin D, Cholecalciferol, 25 MCG (1000 UT) TABS Take 1 tablet by mouth once daily 02/18/19   Tysinger, Camelia Eng, PA-C    Family History Family History  Problem Relation Age of Onset  . Diabetes Mother   . Hypertension Mother   . Aneurysm Mother   . Stroke Mother   . Cancer Mother        cervical cancer  . Cancer Maternal Aunt  breast  . Cancer Cousin        breast/breast  . Heart disease Neg Hx   . Colon cancer Neg Hx     Social History Social History   Tobacco Use  . Smoking status: Current Every Day Smoker    Packs/day: 0.25    Years: 14.00    Pack years: 3.50    Types: Cigarettes  . Smokeless tobacco: Never Used  Substance Use Topics  . Alcohol use: Yes    Comment: occ  . Drug use: No     Allergies   Lisinopril, Potassium chloride, Pneumococcal vaccines, and Vicodin [hydrocodone-acetaminophen]   Review of Systems Review of Systems  Constitutional: Negative for chills and fever.  Genitourinary: Negative for difficulty urinating.  Musculoskeletal: Positive for back pain.  Neurological: Negative for weakness and numbness.     Physical Exam Updated Vital Signs BP (!) 135/92 (BP Location: Right Arm)   Pulse (!) 108   Temp 99.2 F (37.3 C) (Oral)   Resp 19   LMP 06/29/2015 (Exact Date)   SpO2 94%   Physical Exam Vitals signs and nursing note reviewed.  Constitutional:      Appearance: She is not ill-appearing.  HENT:     Head: Normocephalic and atraumatic.  Eyes:     Conjunctiva/sclera: Conjunctivae normal.  Cardiovascular:     Rate and Rhythm: Normal rate and regular rhythm.  Pulmonary:     Effort: Pulmonary effort is normal.     Breath sounds: Normal breath sounds.  Genitourinary:    Vagina: Vaginal discharge:    Musculoskeletal:     Comments: No C, T, or L midline spinal tenderness. Pt has diffuse lumbar paraspinal  tenderness bilaterally. + SLR on right. Strength 4/5 bilaterally. Sensation intact to dull and sharp touch throughout BLEs. Good distal pulses.   Skin:    General: Skin is warm and dry.     Coloration: Skin is not jaundiced.  Neurological:     Mental Status: She is alert.      ED Treatments / Results  Labs (all labs ordered are listed, but only abnormal results are displayed) Labs Reviewed - No data to display  EKG None  Radiology No results found.  Procedures Procedures (including critical care time)  Medications Ordered in ED Medications - No data to display   Initial Impression / Assessment and Plan / ED Course  I have reviewed the triage vital signs and the nursing notes.  Pertinent labs & imaging results that were available during my care of the patient were reviewed by me and considered in my medical decision making (see chart for details).    53 year old female who presents today for unchanged back pain.  Seen in the ED yesterday for same and prescribed prednisone steroid pack.  She went to her pharmacy and it was not there.  It appears that the PA who saw her yesterday attempted to call pharmacy back and place order.  Will need to talk with pharmacy and see if they have it.  Pt has no worsening symptoms today.  No concern for cauda equina.   Discussed case with pharmacy.  They report that they have the medication.  Will send patient over to pick this up as I feel this will significantly help her pain.  Strict return precautions discussed.  Patient is in agreement with plan and stable for discharge at this time.   This note was prepared using Dragon voice recognition software and may include unintentional dictation errors due  to the inherent limitations of voice recognition software.       Final Clinical Impressions(s) / ED Diagnoses   Final diagnoses:  Bilateral sciatica  Chronic bilateral low back pain with bilateral sciatica    ED Discharge Orders    None        Eustaquio Maize, PA-C 06/25/19 1539    Davonna Belling, MD 06/26/19 [phone]

## 2019-06-25 NOTE — Discharge Instructions (Signed)
I have called your pharmacy and they have the medication that was prescribed yesterday ready for you to pick up.   Please take as prescribed. It is recommended that you do not take Ibuprofen with this medication as it can cause GI upset.   Follow up with your PCP regarding your symptoms.   Return to the ED immediately for any worsening back pain, peeing or pooping on yourself, numbness in your groin area.

## 2019-07-02 ENCOUNTER — Other Ambulatory Visit: Payer: Self-pay

## 2019-07-02 ENCOUNTER — Ambulatory Visit: Payer: Self-pay | Attending: Family Medicine | Admitting: Pharmacist

## 2019-07-02 ENCOUNTER — Encounter: Payer: Self-pay | Admitting: Pharmacist

## 2019-07-02 VITALS — BP 128/78

## 2019-07-02 DIAGNOSIS — I10 Essential (primary) hypertension: Secondary | ICD-10-CM

## 2019-07-02 MED ORDER — ATORVASTATIN CALCIUM 40 MG PO TABS: 40.0000 mg | ORAL_TABLET | Freq: Every day | ORAL | 2 refills | Status: DC

## 2019-07-02 MED FILL — ATORVASTATIN CALCIUM 40 MG: 40 | 30 days supply | Qty: 30 | Fill #0

## 2019-07-02 NOTE — Patient Instructions (Signed)
Thank you for coming to see Sydney Williams today.   Blood pressure today is well-controlled.  Continue taking blood pressure medications as prescribed.   Stop simvastatin and start atorvastatin in its place. Take 1 tab a day.   Limiting salt and caffeine, as well as exercising as able for at least 30 minutes for 5 days out of the week, can also help you lower your blood pressure.  Take your blood pressure at home if you are able. Please write down these numbers and bring them to your visits.  If you have any questions about medications, please call me [phone].  [name]

## 2019-07-02 NOTE — Progress Notes (Signed)
   S:    PCP: Dr. Chapman Fitch  Patient arrives in good spirits. Presents to the clinic for hypertension evaluation, counseling, and management.  Patient was referred and last seen by Primary Care Provider on 06/03/19. BP at that visit 162/98 and amlodipine dose was increased. I saw her 06/18/19 and her DBP improved to 78, but she has smoked right before seeing me.   Patient reports adherence with medications. Pt endorses back pain, reports taking 2 advil before seeing me today.  Current BP Medications include:  Amlodipine 10 mg daily, HCTZ 25 mg daily, metoprolol 50 mg BID  Antihypertensives tried in the past include: lisinopril (swelling), spironolactone  Dietary habits include: cut down on salt; 1 cup of coffee per day  Exercise habits include: none outside of work  Family / Social history:  - FHx: DM, HTN, aneurysm, stroke, and cancer (mother) - Tobacco: current every day smoker (0.25 PPD)  - Alcohol: denies use  O:  Home BP readings: does not check consistently, cannot provide reading.  Last 3 Office BP readings: BP Readings from Last 3 Encounters:  07/02/19 128/78  06/25/19 (!) 144/92  06/24/19 (!) 139/93   BMET    Component Value Date/Time   NA 144 06/03/2019 1125   K 3.7 06/03/2019 1125   CL 97 06/03/2019 1125   CO2 24 06/03/2019 1125   GLUCOSE 119 (H) 06/03/2019 1125   GLUCOSE 78 07/02/2017 1136   BUN 12 06/03/2019 1125   CREATININE 0.65 06/03/2019 1125   CREATININE 0.54 07/02/2017 1136   CALCIUM 9.8 06/03/2019 1125   GFRNONAA 101 06/03/2019 1125   GFRAA 116 06/03/2019 1125   Renal function: CrCl cannot be calculated (Patient's most recent lab result is older than the maximum 21 days allowed.).  Clinical ASCVD: No  The 10-year ASCVD risk score Mikey Bussing DC Jr., et al., 2013) is: 19.4%   Values used to calculate the score:     Age: 53 years     Sex: Female     Is Non-Hispanic African American: Yes     Diabetic: Yes     Tobacco smoker: Yes     Systolic Blood Pressure:  128 mmHg     Is BP treated: Yes     HDL Cholesterol: 51 mg/dL     Total Cholesterol: 175 mg/dL   A/P: Hypertension longstanding currently controlled on current medications. BP Goal = <130/80 mmHg. Patient is adherent with current medications.  -Continued current regimen.  -F/u labs ordered - lipid, urine microalb:SCr.  -Counseled on lifestyle modifications for blood pressure control including reduced dietary sodium, increased exercise, adequate sleep -ASCVD risk: primary prevention in a pt with DM. Stop simvastatin, start atorvastatin. ASCVD risk score ~20%. -HM: influenza indicated; pt deferred. "I will think about it"  Results reviewed and written information provided.   Total time in face-to-face counseling 30 minutes.   F/U with PCP asap.    Patient seen with : Dillard Essex PharmD Candidate  Class of 2022 [place], PharmD, [place] [phone]

## 2019-07-04 LAB — MICROALBUMIN / CREATININE URINE RATIO
Creatinine, Urine: 105.2 mg/dL
Microalb/Creat Ratio: 4 mg/g{creat} (ref 0–29)
Microalbumin, Urine: 3.8 ug/mL

## 2019-07-04 LAB — LIPID PANEL
Chol/HDL Ratio: 4.3 ratio (ref 0.0–4.4)
Cholesterol, Total: 172 mg/dL (ref 100–199)
HDL: 40 mg/dL
LDL Chol Calc (NIH): 79 mg/dL (ref 0–99)
Triglycerides: 326 mg/dL — ABNORMAL HIGH (ref 0–149)
VLDL Cholesterol Cal: 53 mg/dL — ABNORMAL HIGH (ref 5–40)

## 2019-07-06 MED FILL — AMLODIPINE BESYLATE 10 MG T: 10 | 30 days supply | Qty: 30 | Fill #1

## 2019-07-06 MED FILL — HYDROCHLOROTHIAZIDE 25 MG T: 25 | 30 days supply | Qty: 30 | Fill #1

## 2019-07-09 ENCOUNTER — Ambulatory Visit: Payer: Self-pay | Attending: Family Medicine | Admitting: Family Medicine

## 2019-07-09 DIAGNOSIS — M5416 Radiculopathy, lumbar region: Secondary | ICD-10-CM

## 2019-07-09 DIAGNOSIS — M25561 Pain in right knee: Secondary | ICD-10-CM

## 2019-07-09 MED ORDER — PREDNISONE 20 MG PO TABS: ORAL_TABLET | ORAL | 0 refills | Status: DC

## 2019-07-09 MED ORDER — IBUPROFEN 600 MG PO TABS: 600.0000 mg | ORAL_TABLET | Freq: Three times a day (TID) | ORAL | 0 refills | Status: DC | PRN

## 2019-07-09 NOTE — Progress Notes (Signed)
Virtual Visit via Telephone Note  I connected with Sydney Williams on 07/09/19 at 4:33 EDT by telephone and verified that I am speaking with the correct person using two identifiers.   I discussed the limitations, risks, security and privacy concerns of performing an evaluation and management service by telephone and the availability of in person appointments. I also discussed with the patient that there may be a patient responsible charge related to this service. The patient expressed understanding and agreed to proceed.  Patient Location: Home Provider Location: [facility] Others participating in call: call initiated by Emilio Aspen, RMA who then transferred the call   History of Present Illness:      53 yo female with complaint of sudden onset of right knee pain which she thinks is the result of her gait being affected by her back pain with radiation. She is status post ED visits on 06/24/2019 and 06/25/2019 due to increased low back pain which radiates down both of her legs. She reports that this has been a long standing issue with her back pain but recently worsened that the medications that she has been taking have not really helped. Pain is now mostly on the right side and radiates from the back and she is also having right knee pain. Pain ranges from a 6-8 and sometimes greater than 10.  Pain is better when she is being still, no active but she has increased back and knee pain when she attempts to stand up from a seated position especially if she has been sitting for awhile. She is taking tylenol   Past Medical History:  Diagnosis Date  . Asthma    2015 hospitaliation for asthma  . Diabetes mellitus    vs impaired fasting glucose  . Family history of breast cancer   . Fibroids 2010  . Genital herpes   . GERD (gastroesophageal reflux disease)   . Hyperlipidemia   . Hypertension   . Impaired fasting blood sugar   . Migraines   . Obesity   . Smoker     Past Surgical History:    Procedure Laterality Date  . COLONOSCOPY  03/13/2016   Dr. Carlean Purl, normal, repeat 2027  . CYST EXCISION     left neck/postauricular region, benign  . LAPAROSCOPIC ABDOMINAL EXPLORATION     removal of ectopic preg  . LEFT HEART CATH AND CORONARY ANGIOGRAPHY N/A 06/23/2017   Procedure: LEFT HEART CATH AND CORONARY ANGIOGRAPHY;  Surgeon: Nelva Bush, MD;  Location: [facility];  Service: Cardiovascular;  Laterality: N/A;  . TUBAL LIGATION      Family History  Problem Relation Age of Onset  . Diabetes Mother   . Hypertension Mother   . Aneurysm Mother   . Stroke Mother   . Cancer Mother        cervical cancer  . Cancer Maternal Aunt        breast  . Cancer Cousin        breast/breast  . Heart disease Neg Hx   . Colon cancer Neg Hx     Social History   Tobacco Use  . Smoking status: Current Every Day Smoker    Packs/day: 0.25    Years: 14.00    Pack years: 3.50    Types: Cigarettes  . Smokeless tobacco: Never Used  Substance Use Topics  . Alcohol use: Yes    Comment: occ  . Drug use: No     Allergies  Allergen Reactions  . Lisinopril  Swelling  . Potassium Chloride Shortness Of Breath and Swelling    Tolerates IV KCl, reaction only to PO product  . Pneumococcal Vaccines Swelling and Other (See Comments)    Reaction:  Swelling at injection site  . Vicodin [Hydrocodone-Acetaminophen] Itching and Rash       Observations/Objective: No vital signs or physical exam conducted as visit was done via telephone  Assessment and Plan: 1. Lumbar back pain with radiculopathy affecting right lower extremity Recent ED notes reviewed. X-rays from July 2020 show moderate disc disease at L3-4 and L4-5. New RX's provided for ibuprofen 600 mg every 8 hours as needed, continue home flexeril and prednisone taper provided. Ortho referral provided. Avoid lifting, bending or twisting movements. - AMB referral to orthopedics - ibuprofen (ADVIL) 600 MG tablet; Take 1 tablet (600  mg total) by mouth every 8 (eight) hours as needed for moderate pain. Take after eating  Dispense: 60 tablet; Refill: 0 - predniSONE (DELTASONE) 20 MG tablet; Take 3 pills the first day then 2 pills daily for 2 days, 1 pill daily for 2 days and 1/2 pill daily for 4 days; take after eating  Dispense: 11 tablet; Refill: 0  2. Acute pain of right knee RX's for ibuprofen and prednisone. Ortho referral. May ice knee for 20 minutes 3-4 times per day as needed for pain/swelling but place ice bag or cold pack over protected skin.  - AMB referral to orthopedics - ibuprofen (ADVIL) 600 MG tablet; Take 1 tablet (600 mg total) by mouth every 8 (eight) hours as needed for moderate pain. Take after eating  Dispense: 60 tablet; Refill: 0 - predniSONE (DELTASONE) 20 MG tablet; Take 3 pills the first day then 2 pills daily for 2 days, 1 pill daily for 2 days and 1/2 pill daily for 4 days; take after eating  Dispense: 11 tablet; Refill: 0  Follow Up Instructions:Return if symptoms worsen or fail to improve.    I discussed the assessment and treatment plan with the patient. The patient was provided an opportunity to ask questions and all were answered. The patient agreed with the plan and demonstrated an understanding of the instructions.   The patient was advised to call back or seek an in-person evaluation if the symptoms worsen or if the condition fails to improve as anticipated.  I provided 12 minutes of non-face-to-face time during this encounter.   Antony Blackbird, MD

## 2019-07-10 ENCOUNTER — Encounter (HOSPITAL_COMMUNITY): Payer: Self-pay | Admitting: Emergency Medicine

## 2019-07-10 ENCOUNTER — Emergency Department (HOSPITAL_COMMUNITY)
Admission: EM | Admit: 2019-07-10 | Discharge: 2019-07-11 | Disposition: A | Discharge status: Home / Self Care | Payer: Self-pay | Attending: Emergency Medicine | Admitting: Emergency Medicine

## 2019-07-10 ENCOUNTER — Other Ambulatory Visit: Payer: Self-pay

## 2019-07-10 DIAGNOSIS — F17210 Nicotine dependence, cigarettes, uncomplicated: Secondary | ICD-10-CM | POA: Insufficient documentation

## 2019-07-10 DIAGNOSIS — R2241 Localized swelling, mass and lump, right lower limb: Secondary | ICD-10-CM | POA: Insufficient documentation

## 2019-07-10 DIAGNOSIS — Z7982 Long term (current) use of aspirin: Secondary | ICD-10-CM | POA: Insufficient documentation

## 2019-07-10 DIAGNOSIS — M25571 Pain in right ankle and joints of right foot: Secondary | ICD-10-CM

## 2019-07-10 DIAGNOSIS — I10 Essential (primary) hypertension: Secondary | ICD-10-CM | POA: Insufficient documentation

## 2019-07-10 DIAGNOSIS — Z7984 Long term (current) use of oral hypoglycemic drugs: Secondary | ICD-10-CM | POA: Insufficient documentation

## 2019-07-10 DIAGNOSIS — E119 Type 2 diabetes mellitus without complications: Secondary | ICD-10-CM | POA: Insufficient documentation

## 2019-07-10 DIAGNOSIS — Z79899 Other long term (current) drug therapy: Secondary | ICD-10-CM | POA: Insufficient documentation

## 2019-07-10 DIAGNOSIS — M25471 Effusion, right ankle: Secondary | ICD-10-CM

## 2019-07-10 DIAGNOSIS — J45909 Unspecified asthma, uncomplicated: Secondary | ICD-10-CM | POA: Insufficient documentation

## 2019-07-10 NOTE — ED Triage Notes (Addendum)
Patient complaining of chronic right back pain that radiates down her leg. Patient states unable to bear weight. Patient seen multiple times before for same. Patient states it started 2 hours ago when she stood up.

## 2019-07-11 ENCOUNTER — Emergency Department (HOSPITAL_COMMUNITY): Payer: Self-pay

## 2019-07-11 ENCOUNTER — Ambulatory Visit (HOSPITAL_COMMUNITY): Admission: RE | Admit: 2019-07-11 | Payer: No Typology Code available for payment source | Source: Ambulatory Visit

## 2019-07-11 MED ORDER — ENOXAPARIN SODIUM 120 MG/0.8ML ~~LOC~~ SOLN
115.0000 mg | Freq: Once | SUBCUTANEOUS | Status: AC
  Administered 2019-07-11: 115 mg via SUBCUTANEOUS
  Filled 2019-07-11: qty 0.77

## 2019-07-11 MED ORDER — OXYCODONE-ACETAMINOPHEN 5-325 MG PO TABS
2.0000 | ORAL_TABLET | Freq: Once | ORAL | Status: AC
  Administered 2019-07-11: 2 via ORAL
  Filled 2019-07-11: qty 2

## 2019-07-11 MED ORDER — OXYCODONE-ACETAMINOPHEN 5-325 MG PO TABS: 1.0000 | ORAL_TABLET | Freq: Four times a day (QID) | ORAL | 0 refills | Status: DC | PRN

## 2019-07-11 NOTE — ED Notes (Signed)
Patient transported to X-ray 

## 2019-07-11 NOTE — Discharge Instructions (Addendum)
Take 1-2 Percocet every 6 hours as needed for severe pain.  Please return tomorrow morning as directed below for ultrasound to rule out blood clot.  Please follow-up with your doctor next week for recheck.  I recommend massaging your foot with a frozen water bottle or juice can as well as buying orthotics for plantar fasciitis.  Please return to the emergency department if you develop any new or worsening symptoms including chest pain, shortness of breath, increasing swelling or pain, fevers.  Do not drink alcohol, drive, operate machinery or participate in any other potentially dangerous activities while taking opiate pain medication as it may make you sleepy. Do not take this medication with any other sedating medications, either prescription or over-the-counter. If you were prescribed Percocet or Vicodin, do not take these with acetaminophen (Tylenol) as it is already contained within these medications and overdose of Tylenol is dangerous.   This medication is an opiate (or narcotic) pain medication and can be habit forming.  Use it as little as possible to achieve adequate pain control.  Do not use or use it with extreme caution if you have a history of opiate abuse or dependence. This medication is intended for your use only - do not give any to anyone else and keep it in a secure place where nobody else, especially children, have access to it. It will also cause or worsen constipation, so you may want to consider taking an over-the-counter stool softener while you are taking this medication.

## 2019-07-11 NOTE — ED Provider Notes (Signed)
[facility] DEPT Provider Note   CSN: [id number] Arrival date & time: 07/10/19  2023     History   Chief Complaint Chief Complaint  Patient presents with  . Leg Pain    HPI Sydney Williams is a 53 y.o. female with history of hypertension, diabetes, GERD, asthma, back pain and sciatica who presents with acute onset right foot and ankle pain and swelling that began today.  She denies any injury.  She reports she initially had little pain in her calf, but that has gotten better.  She denies any recent long trips, surgeries, known cancer, exogenous estrogen use, history of blood clots.  Patient denies any chest pain or shortness of breath, fevers.  She has had some tingling and numbness to her toes.  She is unable to bear weight on her foot.     HPI  Past Medical History:  Diagnosis Date  . Asthma    2015 hospitaliation for asthma  . Diabetes mellitus    vs impaired fasting glucose  . Family history of breast cancer   . Fibroids 2010  . Genital herpes   . GERD (gastroesophageal reflux disease)   . Hyperlipidemia   . Hypertension   . Impaired fasting blood sugar   . Migraines   . Obesity   . Smoker     Patient Active Problem List   Diagnosis Date Noted  . Family history of cervical cancer 02/02/2018  . Family history of breast cancer 02/02/2018  . Vitamin D deficiency 02/02/2018  . Need for hepatitis C screening test 02/02/2018  . Low back pain 09/29/2017  . Abnormal myocardial perfusion study   . Depression 06/19/2017  . Hypokalemia 06/19/2017  . Hypomagnesemia 06/19/2017  . Vaccine counseling 01/08/2017  . 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 Screening for cervical cancer 01/08/2017  . Vision exam with abnormal findings 01/08/2017  . Snoring 09/11/2016  . History of migraine 09/11/2016  . Frequent headaches 09/11/2016  . Muscle cramp 09/11/2016  . Encounter for health maintenance examination in adult 12/04/2015  .  Heel pain, bilateral 12/04/2015  . Screening for breast cancer 12/04/2015  . Need for influenza vaccination 12/04/2015  . Moderate persistent asthma 06/01/2015  . Gastroesophageal reflux disease without esophagitis 06/01/2015  . Rhinitis, allergic 06/01/2015  . Genital herpes 11/30/2014  . Smoker 11/30/2014  . Essential hypertension 11/30/2014  . Hyperlipidemia 11/30/2014  . Impaired fasting blood sugar 11/30/2014  . Obesity 07/02/2012    Past Surgical History:  Procedure Laterality Date  . COLONOSCOPY  03/13/2016   Dr. Carlean Purl, normal, repeat 2027  . CYST EXCISION     left neck/postauricular region, benign  . LAPAROSCOPIC ABDOMINAL EXPLORATION     removal of ectopic preg  . LEFT HEART CATH AND CORONARY ANGIOGRAPHY N/A 06/23/2017   Procedure: LEFT HEART CATH AND CORONARY ANGIOGRAPHY;  Surgeon: Nelva Bush, MD;  Location: [facility];  Service: Cardiovascular;  Laterality: N/A;  . TUBAL LIGATION       OB History    Gravida  5   Para  1   Term  1   Preterm      AB  2   Living  3     SAB  1   TAB      Ectopic  1   Multiple      Live Births               Home Medications    Prior to Admission medications  Medication Sig Start Date End Date Taking? Authorizing Provider  acyclovir (ZOVIRAX) 200 MG capsule Take 1 capsule by mouth twice daily 04/08/19   Tysinger, Camelia Eng, PA-C  albuterol (PROVENTIL HFA;VENTOLIN HFA) 108 (90 Base) MCG/ACT inhaler Inhale 1-2 puffs into the lungs every 6 (six) hours as needed for wheezing or shortness of breath. 02/03/18   Tysinger, Camelia Eng, PA-C  amLODipine (NORVASC) 10 MG tablet Take 1 tablet (10 mg total) by mouth daily. To lower blood pressure 06/03/19   Fulp, Cammie, MD  aspirin (EQ ASPIRIN ADULT LOW DOSE) 81 MG EC tablet Take 1 tablet (81 mg total) by mouth daily. Swallow whole. 02/03/18   Tysinger, Camelia Eng, PA-C  atorvastatin (LIPITOR) 40 MG tablet Take 1 tablet (40 mg total) by mouth daily. 07/02/19   Fulp, Cammie, MD   buPROPion (WELLBUTRIN XL) 150 MG 24 hr tablet TAKE 1 TABLET BY MOUTH ONCE DAILY Patient not taking: Reported on 07/09/2019 12/11/18   Tysinger, Camelia Eng, PA-C  EPINEPHrine (EPIPEN 2-PAK) 0.3 mg/0.3 mL IJ SOAJ injection Inject 0.3 mLs (0.3 mg total) into the muscle once as needed for up to 1 dose (for severe allergic reaction). CAll 911 immediately if you have to use this medicine 07/12/18   Baird Cancer, Vilinda Blanks, PA-C  Fluticasone-Salmeterol (ADVAIR) 250-50 MCG/DOSE AEPB Inhale 1 puff into the lungs 2 (two) times daily. 02/03/18   Tysinger, Camelia Eng, PA-C  gabapentin (NEURONTIN) 300 MG capsule Take 300 mg by mouth at bedtime as needed (back pain).  08/20/18   [provider]  hydrochlorothiazide (HYDRODIURIL) 25 MG tablet Take 1 tablet (25 mg total) by mouth daily. To lower blood pressure 04/22/19   Fulp, Cammie, MD  ibuprofen (ADVIL) 600 MG tablet Take 1 tablet (600 mg total) by mouth every 8 (eight) hours as needed for moderate pain. Take after eating 07/09/19   Fulp, Cammie, MD  ibuprofen (ADVIL,MOTRIN) 200 MG tablet Take 800 mg by mouth every 6 (six) hours as needed for headache or moderate pain.     [provider]  metFORMIN (GLUCOPHAGE) 500 MG tablet Take 1 tablet (500 mg total) by mouth daily with breakfast. 02/03/18   Tysinger, Camelia Eng, PA-C  methocarbamol (ROBAXIN) 500 MG tablet Take 1 tablet (500 mg total) by mouth at bedtime as needed for muscle spasms. Patient not taking: Reported on 07/09/2019 09/29/17   Tysinger, Camelia Eng, PA-C  methylPREDNISolone (MEDROL DOSEPAK) 4 MG TBPK tablet Use as directed. Patient not taking: Reported on 07/09/2019 06/24/19   Corena Herter, PA-C  methylPREDNISolone (MEDROL DOSEPAK) 4 MG TBPK tablet Use as directed. Patient not taking: Reported on 07/09/2019 06/24/19   Corena Herter, PA-C  metoprolol tartrate (LOPRESSOR) 50 MG tablet Take 1 tablet (50 mg total) by mouth 2 (two) times daily. 06/10/19   Fulp, Cammie, MD  oxyCODONE-acetaminophen (PERCOCET/ROXICET)  5-325 MG tablet Take 1-2 tablets by mouth every 6 (six) hours as needed for severe pain. 07/11/19   Tenelle Andreason, Bea Graff, PA-C  predniSONE (DELTASONE) 20 MG tablet Take 3 pills the first day then 2 pills daily for 2 days, 1 pill daily for 2 days and 1/2 pill daily for 4 days; take after eating 07/09/19   Fulp, Cammie, MD  PRILOSEC OTC 20 MG tablet Take 20 mg by mouth 2 (two) times daily. 08/20/18   [provider]  traMADol (ULTRAM) 50 MG tablet Take 1 tablet (50 mg total) by mouth every 6 (six) hours as needed. Patient not taking: Reported on 07/09/2019 05/18/19  Lajean Saver, MD  Vitamin D, Cholecalciferol, 25 MCG (1000 UT) TABS Take 1 tablet by mouth once daily 02/18/19   Tysinger, Camelia Eng, PA-C    Family History Family History  Problem Relation Age of Onset  . Diabetes Mother   . Hypertension Mother   . Aneurysm Mother   . Stroke Mother   . Cancer Mother        cervical cancer  . Cancer Maternal Aunt        breast  . Cancer Cousin        breast/breast  . Heart disease Neg Hx   . Colon cancer Neg Hx     Social History Social History   Tobacco Use  . Smoking status: Current Every Day Smoker    Packs/day: 0.25    Years: 14.00    Pack years: 3.50    Types: Cigarettes  . Smokeless tobacco: Never Used  Substance Use Topics  . Alcohol use: Yes    Comment: occ  . Drug use: No     Allergies   Lisinopril, Potassium chloride, Pneumococcal vaccines, and Vicodin [hydrocodone-acetaminophen]   Review of Systems Review of Systems  Constitutional: Negative for fever.  Musculoskeletal: Positive for arthralgias and joint swelling.  Neurological: Positive for numbness.     Physical Exam Updated Vital Signs BP 128/88 (BP Location: Right Arm)   Pulse 98   Temp 98.2 F (36.8 C) (Oral)   Resp 18   Wt 115 kg   LMP 06/29/2015 (Exact Date)   SpO2 98%   BMI 35.36 kg/m   Physical Exam Vitals signs and nursing note reviewed.  Constitutional:      General: She is not in  acute distress.    Appearance: She is well-developed. She is not diaphoretic.     Comments: Will not bear weight on the foot Yelling and screaming in pain  HENT:     Head: Normocephalic and atraumatic.     Mouth/Throat:     Pharynx: No oropharyngeal exudate.  Eyes:     General: No scleral icterus.       Right eye: No discharge.        Left eye: No discharge.     Conjunctiva/sclera: Conjunctivae normal.     Pupils: Pupils are equal, round, and reactive to light.  Neck:     Musculoskeletal: Normal range of motion and neck supple.     Thyroid: No thyromegaly.  Cardiovascular:     Rate and Rhythm: Normal rate and regular rhythm.     Heart sounds: Normal heart sounds. No murmur. No friction rub. No gallop.   Pulmonary:     Effort: Pulmonary effort is normal. No respiratory distress.     Breath sounds: Normal breath sounds. No stridor. No wheezing or rales.  Abdominal:     General: Bowel sounds are normal. There is no distension.     Palpations: Abdomen is soft.     Tenderness: There is no abdominal tenderness. There is no guarding or rebound.  Musculoskeletal:     Right lower leg: Edema present.     Comments: Moderate edema to the right ankle and foot; skin is intact with no warmth or erythema, DP pulses intact, cap refill less than 2 seconds; significant tenderness behind the medial malleolus and some over the plantar fascia; very mild tenderness below the knee medially; compartments are soft Pes planus No back or hip pain or tenderness  Lymphadenopathy:     Cervical: No cervical adenopathy.  Skin:  General: Skin is warm and dry.     Coloration: Skin is not pale.     Findings: No rash.  Neurological:     Mental Status: She is alert.     Coordination: Coordination normal.      ED Treatments / Results  Labs (all labs ordered are listed, but only abnormal results are displayed) Labs Reviewed - No data to display  EKG None  Radiology Dg Ankle Complete Right  Result  Date: 07/11/2019 CLINICAL DATA:  Right ankle pain and swelling. No known injury. EXAM: RIGHT ANKLE - COMPLETE 3+ VIEW COMPARISON:  None. FINDINGS: There is no evidence of fracture, dislocation, or joint effusion. Mild degenerative spurring of the hindfoot. Plantar calcaneal spur and Achilles tendon enthesophyte. No bony destructive change or osseous erosions. Generalized soft tissue edema. IMPRESSION: Generalized soft tissue edema. No acute osseous abnormality. Mild degenerative spurring of the hindfoot. Electronically Signed   By: Keith Rake M.D.   On: 07/11/2019 01:03   Dg Foot Complete Right  Result Date: 07/11/2019 CLINICAL DATA:  Right foot and ankle swelling. Right heel pain. EXAM: RIGHT FOOT COMPLETE - 3+ VIEW COMPARISON:  Ankle radiograph performed concurrently. FINDINGS: There is no evidence of fracture or dislocation. Mild degenerative hindfoot spurring. Plantar calcaneal spur and Achilles tendon enthesophyte. No bony destructive change or evidence of laboratory arthropathy. Generalized soft tissue edema. IMPRESSION: 1. Generalized soft tissue edema. No acute osseous abnormality. Mild hindfoot osteoarthritis. 2. Plantar calcaneal spur and Achilles tendon enthesophyte. Electronically Signed   By: Keith Rake M.D.   On: 07/11/2019 01:08    Procedures Procedures (including critical care time)  Medications Ordered in ED Medications  oxyCODONE-acetaminophen (PERCOCET/ROXICET) 5-325 MG per tablet 2 tablet (2 tablets Oral Given 07/11/19 0025)  enoxaparin (LOVENOX) injection 115 mg (115 mg Subcutaneous Given 07/11/19 0158)     Initial Impression / Assessment and Plan / ED Course  I have reviewed the triage vital signs and the nursing notes.  Pertinent labs & imaging results that were available during my care of the patient were reviewed by me and considered in my medical decision making (see chart for details).        Patient with acute onset right foot and ankle pain and  swelling.  She denies any injury.  Considering patient's tenderness on the venous distribution, I would have her return for Doppler ultrasound tomorrow to rule out DVT.  Plan for prophylactic Lovenox.  X-rays are pending at shift change.  Low suspicion of infection or septic joint.  If these are negative, will have patient return, however patient could have symptoms from plantar fasciitis.  I recommended getting orthotics and ice massage with juice can or water bottle.  Patient discharged home with short course of Percocet.  I reviewed the [place] narcotic database and found no discrepancies.  Return precautions discussed.  Patient understands and agrees with plan.  Patient vital stable throughout ED course and discharged in satisfactory condition.  Patient also evaluated by my attending, Dr. Betsey Holiday, who guided the patient's management and agrees with plan.  Final Clinical Impressions(s) / ED Diagnoses   Final diagnoses:  Acute right ankle pain  Right ankle swelling    ED Discharge Orders         Ordered    oxyCODONE-acetaminophen (PERCOCET/ROXICET) 5-325 MG tablet  Every 6 hours PRN     07/11/19 0127    LE VENOUS     07/11/19 0129           Berdina Cheever,  Bea Graff, PA-C 07/11/19 1512    Orpah Greek, MD 07/11/19 [phone]

## 2019-07-11 NOTE — ED Notes (Cosign Needed)
Return from xray

## 2019-07-12 MED FILL — predniSONE 20 MG TABS: 20 | 9 days supply | Qty: 11 | Fill #0

## 2019-07-12 MED FILL — IBUPROFEN 600 MG TABLET: 600 | 20 days supply | Qty: 60 | Fill #0

## 2019-07-13 ENCOUNTER — Encounter: Payer: Self-pay | Admitting: Family Medicine

## 2019-07-20 MED FILL — ?METOPROLOL 50 MG TABLET: 50 | 30 days supply | Qty: 60 | Fill #1

## 2019-07-23 ENCOUNTER — Telehealth: Payer: Self-pay | Admitting: Family Medicine

## 2019-07-23 NOTE — Telephone Encounter (Signed)
New Message   Pt came in states she has an ortho appt and they told her to come here and get a copy of her eligibility. Please f/u patient says she will come back

## 2019-07-23 NOTE — Telephone Encounter (Signed)
I called back the Pt, an informed her that her CAFA was denied and if she want a complete explanation to please contact the customer service at [phone] and they will explain her why she was denied her CAFA application

## 2019-07-27 DIAGNOSIS — Z111 Encounter for screening for respiratory tuberculosis: Secondary | ICD-10-CM

## 2019-07-28 ENCOUNTER — Telehealth (HOSPITAL_COMMUNITY): Payer: Self-pay | Admitting: *Deleted

## 2019-07-28 ENCOUNTER — Ambulatory Visit (INDEPENDENT_AMBULATORY_CARE_PROVIDER_SITE_OTHER): Payer: Self-pay | Admitting: Family Medicine

## 2019-07-28 ENCOUNTER — Encounter: Payer: Self-pay | Admitting: Family Medicine

## 2019-07-28 DIAGNOSIS — G8929 Other chronic pain: Secondary | ICD-10-CM

## 2019-07-28 DIAGNOSIS — M5442 Lumbago with sciatica, left side: Secondary | ICD-10-CM

## 2019-07-28 DIAGNOSIS — M7989 Other specified soft tissue disorders: Secondary | ICD-10-CM

## 2019-07-28 DIAGNOSIS — M5441 Lumbago with sciatica, right side: Secondary | ICD-10-CM

## 2019-07-28 DIAGNOSIS — M79604 Pain in right leg: Secondary | ICD-10-CM

## 2019-07-28 MED ORDER — BACLOFEN 10 MG PO TABS: 10.0000 mg | ORAL_TABLET | Freq: Three times a day (TID) | ORAL | 1 refills | Status: DC | PRN

## 2019-07-28 MED ORDER — NABUMETONE 750 MG PO TABS: 750.0000 mg | ORAL_TABLET | Freq: Two times a day (BID) | ORAL | 6 refills | Status: DC | PRN

## 2019-07-28 MED ORDER — TRAMADOL HCL 50 MG PO TABS: 50.0000 mg | ORAL_TABLET | Freq: Four times a day (QID) | ORAL | 0 refills | Status: DC | PRN

## 2019-07-28 MED FILL — ?BACLOFEN 10 MG TABLET: 10 | 15 days supply | Qty: 90 | Fill #0

## 2019-07-28 MED FILL — traMADol HCL 50 MG TABS: 50 | 7 days supply | Qty: 30 | Fill #0

## 2019-07-28 MED FILL — NABUMETONE 750 MG TABS: 750 | 30 days supply | Qty: 60 | Fill #0

## 2019-07-28 NOTE — Telephone Encounter (Signed)
The above patient or their representative was contacted and gave the following answers to these questions:         Do you have any of the following symptoms?  no  Fever                    Cough                   Shortness of breath  Do  you have any of the following other symptoms? no   muscle pain         vomiting,        diarrhea        rash         weakness        red eye        abdominal pain         bruising          bruising or bleeding              joint pain           severe headache    Have you been in contact with someone who was or has been sick in the past 2 weeks? no  Yes                 Unsure                         Unable to assess   Does the person that you were in contact with have any of the following symptoms?   Cough         shortness of breath           muscle pain         vomiting,            diarrhea            rash            weakness           fever            red eye           abdominal pain           bruising  or  bleeding                joint pain                severe headache               Have you  or someone you have been in contact with traveled internationally in th last month?         If yes, which countries?   Have you  or someone you have been in contact with traveled outside [place] in th last month?         If yes, which state and city?   COMMENTS OR ACTION PLAN FOR THIS PATIENT:          

## 2019-07-28 NOTE — Progress Notes (Signed)
Office Visit Note   Patient: Sydney Williams           Date of Birth: 09/01/1966           MRN: [id number] Visit Date: 07/28/2019 Requested by: Antony Blackbird, MD [place],  [place] [id number] PCP: Antony Blackbird, MD  Subjective: Chief Complaint  Patient presents with  . Lower Back - Pain    Pain in lower back x 2 months. H/o back injury at work 2018. "Pins & needles" in the left thigh. Pain radiating down the right leg and pain in the right hip. Hip pain started 2 days ago. Walking with 1 crutch.    HPI: She is here with low back and right greater than left leg pain.  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  While bent over and lifting, she had sudden severe pain in her lower back.  She went to another orthopedic clinic where x-rays were obtained, she was subsequently referred to physical therapy.  Her pain was not getting better so she was referred for an MRI scan followed by injections.  None of this helped so she was referred to Dr. Patrice Paradise who recommended surgery, but [employer]'s [employer]. apparently denied her claim so surgery was not performed.  She has been managing her pain until 2 months ago when it flared up significantly.  She is having pins-and-needles sensation into the left thigh and severe pain down the posterior lateral right hip and leg.  She has been walking with a crutch for support.  She went to the hospital recently and had x-rays obtained of her right ankle and foot due to swelling of her lower extremity which started a few weeks ago.  X-rays showed midfoot degenerative changes but no sign of stress fracture.  She was given a Medrol Dosepak which has not helped.  Is using a muscle relaxant and Percocet for severe pain.  She tried gabapentin but it made her have strange dreams.               ROS: Denies bowel or bladder dysfunction, fevers or chills.  All other systems were reviewed and are negative.  Objective: Vital Signs: LMP  06/29/2015 (Exact Date)   Physical Exam:  General:  Alert and oriented, in no acute distress. Pulm:  Breathing unlabored. Psy:  Normal mood, congruent affect. Skin: No visible rash. Low back: She is tender to palpation in the midline over the L5-S1 level and in both sciatic notch areas.  Slightly tender near the SI joints.  Straight leg raise is equivocal, unable to assess lower extremity strength on the right because it hurts her to do resisted strength testing.  She does have some edema in her right foot just beyond the ankle.  No edema in the left leg.  No palpable cords in the popliteal fossa.  Homans sign is negative.   Imaging: None today.  Assessment & Plan: 1.  Chronic and worsening low back and bilateral leg sciatica pain -We will order a new MRI scan.  Surgical consult if indicated. -Baclofen, Relafen as needed.  Tramadol for pain.  Could refill Percocet short-term use sparingly but we will try to minimize that.  2.  Right leg swelling, etiology uncertain. -Doppler to rule out DVT.     Procedures: No procedures performed  No notes on file     PMFS History: Patient Active Problem List   Diagnosis Date Noted  .  Family history of cervical cancer 02/02/2018  . Family history of breast cancer 02/02/2018  . Vitamin D deficiency 02/02/2018  . Need for hepatitis C screening test 02/02/2018  . Low back pain 09/29/2017  . Abnormal myocardial perfusion study   . Depression 06/19/2017  . Hypokalemia 06/19/2017  . Hypomagnesemia 06/19/2017  . Vaccine counseling 01/08/2017  . 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 Screening for cervical cancer 01/08/2017  . Vision exam with abnormal findings 01/08/2017  . Snoring 09/11/2016  . History of migraine 09/11/2016  . Frequent headaches 09/11/2016  . Muscle cramp 09/11/2016  . Encounter for health maintenance examination in adult 12/04/2015  . Heel pain, bilateral 12/04/2015  . Screening for breast cancer  12/04/2015  . Need for influenza vaccination 12/04/2015  . Moderate persistent asthma 06/01/2015  . Gastroesophageal reflux disease without esophagitis 06/01/2015  . Rhinitis, allergic 06/01/2015  . Genital herpes 11/30/2014  . Smoker 11/30/2014  . Essential hypertension 11/30/2014  . Hyperlipidemia 11/30/2014  . Impaired fasting blood sugar 11/30/2014  . Obesity 07/02/2012   Past Medical History:  Diagnosis Date  . Asthma    2015 hospitaliation for asthma  . Diabetes mellitus    vs impaired fasting glucose  . Family history of breast cancer   . Fibroids 2010  . Genital herpes   . GERD (gastroesophageal reflux disease)   . Hyperlipidemia   . Hypertension   . Impaired fasting blood sugar   . Migraines   . Obesity   . Smoker     Family History  Problem Relation Age of Onset  . Diabetes Mother   . Hypertension Mother   . Aneurysm Mother   . Stroke Mother   . Cancer Mother        cervical cancer  . Cancer Maternal Aunt        breast  . Cancer Cousin        breast/breast  . Heart disease Neg Hx   . Colon cancer Neg Hx     Past Surgical History:  Procedure Laterality Date  . COLONOSCOPY  03/13/2016   Dr. Carlean Purl, normal, repeat 2027  . CYST EXCISION     left neck/postauricular region, benign  . LAPAROSCOPIC ABDOMINAL EXPLORATION     removal of ectopic preg  . LEFT HEART CATH AND CORONARY ANGIOGRAPHY N/A 06/23/2017   Procedure: LEFT HEART CATH AND CORONARY ANGIOGRAPHY;  Surgeon: Nelva Bush, MD;  Location: [facility];  Service: Cardiovascular;  Laterality: N/A;  . TUBAL LIGATION     Social History   Occupational History  . Not on file  Tobacco Use  . Smoking status: Current Every Day Smoker    Packs/day: 0.25    Years: 14.00    Pack years: 3.50    Types: Cigarettes  . Smokeless tobacco: Never Used  Substance and Sexual Activity  . Alcohol use: Yes    Comment: occ  . Drug use: No  . Sexual activity: Yes    Birth control/protection: Surgical

## 2019-07-29 ENCOUNTER — Other Ambulatory Visit: Payer: Self-pay

## 2019-07-29 ENCOUNTER — Telehealth: Payer: Self-pay | Admitting: *Deleted

## 2019-07-29 ENCOUNTER — Ambulatory Visit (HOSPITAL_COMMUNITY)
Admission: RE | Admit: 2019-07-29 | Discharge: 2019-07-29 | Disposition: A | Discharge status: Home / Self Care | Payer: Self-pay | Source: Ambulatory Visit | Attending: Family | Admitting: Family

## 2019-07-29 ENCOUNTER — Telehealth: Payer: Self-pay | Admitting: Family Medicine

## 2019-07-29 DIAGNOSIS — M7989 Other specified soft tissue disorders: Secondary | ICD-10-CM

## 2019-07-29 DIAGNOSIS — M79604 Pain in right leg: Secondary | ICD-10-CM

## 2019-07-29 NOTE — Telephone Encounter (Signed)
Doppler negative for DVT.

## 2019-07-29 NOTE — Telephone Encounter (Signed)
Received [facility] from [facility] stating pt doppler was Negative for DVT. She has released pt and informed her of the results.

## 2019-08-04 ENCOUNTER — Ambulatory Visit: Payer: Self-pay | Attending: Family Medicine | Admitting: Family Medicine

## 2019-08-04 ENCOUNTER — Encounter: Payer: Self-pay | Admitting: Family Medicine

## 2019-08-04 ENCOUNTER — Other Ambulatory Visit: Payer: Self-pay

## 2019-08-04 DIAGNOSIS — A6000 Herpesviral infection of urogenital system, unspecified: Secondary | ICD-10-CM

## 2019-08-04 DIAGNOSIS — R7301 Impaired fasting glucose: Secondary | ICD-10-CM

## 2019-08-04 DIAGNOSIS — M5416 Radiculopathy, lumbar region: Secondary | ICD-10-CM

## 2019-08-04 MED ORDER — METFORMIN HCL 500 MG PO TABS: 500.0000 mg | ORAL_TABLET | Freq: Every day | ORAL | 1 refills | Status: DC

## 2019-08-04 MED ORDER — IBUPROFEN 600 MG PO TABS: 600.0000 mg | ORAL_TABLET | Freq: Three times a day (TID) | ORAL | 1 refills | Status: DC | PRN

## 2019-08-04 MED ORDER — FUROSEMIDE 20 MG PO TABS: ORAL_TABLET | ORAL | 0 refills | Status: DC

## 2019-08-04 MED ORDER — ACYCLOVIR 200 MG PO CAPS: 200.0000 mg | ORAL_CAPSULE | Freq: Two times a day (BID) | ORAL | 3 refills | Status: DC

## 2019-08-04 MED FILL — ?ATORVASTATIN 40MG TABLET: 40 | 30 days supply | Qty: 30 | Fill #1

## 2019-08-04 MED FILL — ?HYDROCHLOROTHIAZIDE 25MG T: 25 | 30 days supply | Qty: 30 | Fill #2

## 2019-08-04 MED FILL — ?FUROSEMIDE 20MG TABLET: 20 | 20 days supply | Qty: 20 | Fill #0

## 2019-08-04 MED FILL — ACYCLOVIR 200 MG CAPSULE: 200 | 30 days supply | Qty: 60 | Fill #0

## 2019-08-04 MED FILL — ?AMLODIPINE BESYLATE 10 MG: 10 | 30 days supply | Qty: 30 | Fill #2

## 2019-08-04 MED FILL — ?METFORMIN HCL 500MG TABLET: 500 | 30 days supply | Qty: 30 | Fill #0

## 2019-08-04 MED FILL — ?IBUPROFEN 600 MG TABS: 600 | 20 days supply | Qty: 60 | Fill #0

## 2019-08-04 NOTE — Progress Notes (Signed)
Virtual Visit via Telephone Note  I connected with Sydney Williams on 08/04/19 at  1:30 PM EDT by telephone and verified that I am speaking with the correct person using two identifiers.   I discussed the limitations, risks, security and privacy concerns of performing an evaluation and management service by telephone and the availability of in person appointments. I also discussed with the patient that there may be a patient responsible charge related to this service. The patient expressed understanding and agreed to proceed.  Patient Location: Home Provider Location: [facility] Others participating in call: call initiated by Emilio Aspen, RMA, who then transferred the call to me   History of Present Illness:      53 yo female who is status post ED visit on 07/10/2019 due to pain and swelling in her right foot. She states that she was referred to Orhtopedics who prescribed medication as they thought that her pain might be related to her back causing pain to go down her leg. She reports that is she is having sharp, stabbing pain which is radiating down her leg which starts on the right side of her lower back. She was also referred to a vascular doctor to see if she had a blood clot as she also has swelling in her leg.  Patient states that she was told by the vascular doctors that she did not have a blood clot.  She continues to have swelling in the right leg that gets worse as the day progresses.  She states that the swelling goes down somewhat overnight when she is lying down but then when she gets up and tries to ambulate with the use of crutches then she starts to have swelling in her leg.           Patient reports continued sharp, stabbing pain with radiation down the right leg that ranges between 8 and 10 or greater on a 0-to-10 scale.  She reports difficulty with ambulation at times secondary to her pain.  She states that she has difficulty getting out of bed in the mornings and that she  sometimes is afraid that she will fall as she feels that her leg wants to give away.           Patient also would like to continue use of metformin for treatment of prediabetes/insulin resistance.  She does have family history significant for diabetes.  She denies any current issues with increased thirst, urinary frequency or changes in vision.            Patient also requests refill of acyclovir for continued prevention of outbreaks of genital herpes.  She states that she has been on the medication for some time and has not had any breakouts in several years.  She denies any side effects from the medication.            On additional review of systems, she denies any headaches or dizziness currently, no chest pain or palpitations, no shortness of breath or cough.  No abdominal pain-no nausea/vomiting/diarrhea or constipation.  No urinary frequency or dysuria.  No recent fever or chills.   Past Medical History:  Diagnosis Date  . Asthma    2015 hospitaliation for asthma  . Diabetes mellitus    vs impaired fasting glucose  . Family history of breast cancer   . Fibroids 2010  . Genital herpes   . GERD (gastroesophageal reflux disease)   . Hyperlipidemia   . Hypertension   . Impaired  fasting blood sugar   . Migraines   . Obesity   . Smoker     Past Surgical History:  Procedure Laterality Date  . COLONOSCOPY  03/13/2016   Dr. Carlean Purl, normal, repeat 2027  . CYST EXCISION     left neck/postauricular region, benign  . LAPAROSCOPIC ABDOMINAL EXPLORATION     removal of ectopic preg  . LEFT HEART CATH AND CORONARY ANGIOGRAPHY N/A 06/23/2017   Procedure: LEFT HEART CATH AND CORONARY ANGIOGRAPHY;  Surgeon: Nelva Bush, MD;  Location: [facility];  Service: Cardiovascular;  Laterality: N/A;  . TUBAL LIGATION      Family History  Problem Relation Age of Onset  . Diabetes Mother   . Hypertension Mother   . Aneurysm Mother   . Stroke Mother   . Cancer Mother        cervical  cancer  . Cancer Maternal Aunt        breast  . Cancer Cousin        breast/breast  . Heart disease Neg Hx   . Colon cancer Neg Hx     Social History   Tobacco Use  . Smoking status: Current Every Day Smoker    Packs/day: 0.25    Years: 14.00    Pack years: 3.50    Types: Cigarettes  . Smokeless tobacco: Never Used  Substance Use Topics  . Alcohol use: Yes    Comment: occ  . Drug use: No     Allergies  Allergen Reactions  . Lisinopril Swelling  . Potassium Chloride Shortness Of Breath and Swelling    Tolerates IV KCl, reaction only to PO product  . Pneumococcal Vaccines Swelling and Other (See Comments)    Reaction:  Swelling at injection site  . Vicodin [Hydrocodone-Acetaminophen] Itching and Rash       Observations/Objective: No vital signs or physical exam conducted as visit was done via telephone  Assessment and Plan: 1. Lumbar back pain with radiculopathy affecting right lower extremity Patient with chronic issues with lower back pain with radiculopathy.  She requests refill of ibuprofen which was provided and she is also being referred back to  orthopedics for further evaluation and treatment.  She has had prior plain film done 05/18/2019 showing mild spondylosis of the lumbar spine with moderate disc disease at L3-4 and L4-5 levels and to lesser extent at L2-3. - Ambulatory referral to Orthopedic Surgery - ibuprofen (ADVIL) 600 MG tablet; Take 1 tablet (600 mg total) by mouth every 8 (eight) hours as needed for moderate pain. Take after eating  Dispense: 60 tablet; Refill: 1  2. Genital herpes simplex, unspecified site Refill provided of acyclovir for continued suppressive therapy of genital herpes. - acyclovir (ZOVIRAX) 200 MG capsule; Take 1 capsule (200 mg total) by mouth 2 (two) times daily.  Dispense: 180 capsule; Refill: 3  3. Impaired fasting blood sugar Patient with hemoglobin A1c of 6.1 on 04/22/2019.  Patient is to continue the use of metformin as well  as low carbohydrate diet and exercise such as walking as tolerated as patient is also having issues with back and ankle pain.  She denies any symptoms suggestive of diabetes such as increased thirst, blurred vision or urinary frequency currently. - metFORMIN (GLUCOPHAGE) 500 MG tablet; Take 1 tablet (500 mg total) by mouth daily with breakfast.  Dispense: 90 tablet; Refill: 1  Follow Up Instructions: Schedule appointment for follow-up of prediabetes and other chronic medical issues in the next 1 to 2 months.  I discussed the assessment and treatment plan with the patient. The patient was provided an opportunity to ask questions and all were answered. The patient agreed with the plan and demonstrated an understanding of the instructions.   The patient was advised to call back or seek an in-person evaluation if the symptoms worsen or if the condition fails to improve as anticipated.  I provided 16 minutes of non-face-to-face time during this encounter.   Antony Blackbird, MD

## 2019-08-17 ENCOUNTER — Encounter: Payer: Self-pay | Admitting: Family Medicine

## 2019-08-20 ENCOUNTER — Other Ambulatory Visit: Payer: Self-pay

## 2019-08-20 ENCOUNTER — Emergency Department (HOSPITAL_COMMUNITY)
Admission: EM | Admit: 2019-08-20 | Discharge: 2019-08-20 | Disposition: A | Discharge status: Home / Self Care | Payer: Self-pay | Attending: Emergency Medicine | Admitting: Emergency Medicine

## 2019-08-20 DIAGNOSIS — Z5321 Procedure and treatment not carried out due to patient leaving prior to being seen by health care provider: Secondary | ICD-10-CM | POA: Insufficient documentation

## 2019-08-20 NOTE — ED Notes (Signed)
Patient was advised to stay, Patient decided to leave

## 2019-08-20 NOTE — ED Triage Notes (Signed)
Pt endorses lower back pain radiating down both legs with burning sensation in both legs. Denies urinary sx or incontinence or numbness. Walks with crutches. [facility] and [facility] on tramadol and baclofen and it helps for a while and then it comes back. Pedal pulses present. VSS

## 2019-08-24 MED FILL — NABUMETONE 750 MG TABS: 750 | 30 days supply | Qty: 60 | Fill #1

## 2019-08-24 MED FILL — ?BACLOFEN 10 MG TABLET: 10 | 15 days supply | Qty: 90 | Fill #1

## 2019-08-30 ENCOUNTER — Encounter (HOSPITAL_COMMUNITY): Payer: Self-pay | Admitting: Emergency Medicine

## 2019-08-30 ENCOUNTER — Emergency Department (HOSPITAL_COMMUNITY): Payer: Self-pay

## 2019-08-30 ENCOUNTER — Emergency Department (HOSPITAL_COMMUNITY)
Admission: EM | Admit: 2019-08-30 | Discharge: 2019-08-30 | Disposition: A | Discharge status: Home / Self Care | Payer: Self-pay | Attending: Emergency Medicine | Admitting: Emergency Medicine

## 2019-08-30 ENCOUNTER — Other Ambulatory Visit: Payer: Self-pay

## 2019-08-30 DIAGNOSIS — F17210 Nicotine dependence, cigarettes, uncomplicated: Secondary | ICD-10-CM | POA: Insufficient documentation

## 2019-08-30 DIAGNOSIS — Z7984 Long term (current) use of oral hypoglycemic drugs: Secondary | ICD-10-CM | POA: Insufficient documentation

## 2019-08-30 DIAGNOSIS — M25571 Pain in right ankle and joints of right foot: Secondary | ICD-10-CM | POA: Insufficient documentation

## 2019-08-30 DIAGNOSIS — E876 Hypokalemia: Secondary | ICD-10-CM | POA: Insufficient documentation

## 2019-08-30 DIAGNOSIS — Z7982 Long term (current) use of aspirin: Secondary | ICD-10-CM | POA: Insufficient documentation

## 2019-08-30 DIAGNOSIS — E119 Type 2 diabetes mellitus without complications: Secondary | ICD-10-CM | POA: Insufficient documentation

## 2019-08-30 DIAGNOSIS — I10 Essential (primary) hypertension: Secondary | ICD-10-CM | POA: Insufficient documentation

## 2019-08-30 DIAGNOSIS — M25572 Pain in left ankle and joints of left foot: Secondary | ICD-10-CM | POA: Insufficient documentation

## 2019-08-30 DIAGNOSIS — J4540 Moderate persistent asthma, uncomplicated: Secondary | ICD-10-CM | POA: Insufficient documentation

## 2019-08-30 DIAGNOSIS — Z79899 Other long term (current) drug therapy: Secondary | ICD-10-CM | POA: Insufficient documentation

## 2019-08-30 LAB — CBC WITH DIFFERENTIAL/PLATELET
Abs Immature Granulocytes: 0.02 10*3/uL (ref 0.00–0.07)
Basophils Absolute: 0 10*3/uL (ref 0.0–0.1)
Basophils Relative: 1 %
Eosinophils Absolute: 0.2 10*3/uL (ref 0.0–0.5)
Eosinophils Relative: 2 %
HCT: 43 % (ref 36.0–46.0)
Hemoglobin: 13.8 g/dL (ref 12.0–15.0)
Immature Granulocytes: 0 %
Lymphocytes Relative: 23 %
Lymphs Abs: 1.5 10*3/uL (ref 0.7–4.0)
MCH: 31.2 pg (ref 26.0–34.0)
MCHC: 32.1 g/dL (ref 30.0–36.0)
MCV: 97.3 fL (ref 80.0–100.0)
Monocytes Absolute: 0.8 10*3/uL (ref 0.1–1.0)
Monocytes Relative: 12 %
Neutro Abs: 4.1 10*3/uL (ref 1.7–7.7)
Neutrophils Relative %: 62 %
Platelets: 396 10*3/uL (ref 150–400)
RBC: 4.42 MIL/uL (ref 3.87–5.11)
RDW: 13.3 % (ref 11.5–15.5)
WBC: 6.6 10*3/uL (ref 4.0–10.5)
nRBC: 0 % (ref 0.0–0.2)

## 2019-08-30 LAB — COMPREHENSIVE METABOLIC PANEL
ALT: 18 U/L (ref 0–44)
AST: 21 U/L (ref 15–41)
Albumin: 3.8 g/dL (ref 3.5–5.0)
Alkaline Phosphatase: 77 U/L (ref 38–126)
Anion gap: 12 (ref 5–15)
BUN: 6 mg/dL (ref 6–20)
CO2: 31 mmol/L (ref 22–32)
Calcium: 9.9 mg/dL (ref 8.9–10.3)
Chloride: 98 mmol/L (ref 98–111)
Creatinine, Ser: 0.5 mg/dL (ref 0.44–1.00)
GFR calc Af Amer: >60 mL/min (ref >60)
GFR calc non Af Amer: >60 mL/min (ref >60)
Glucose, Bld: 115 mg/dL — ABNORMAL HIGH (ref 70–99)
Potassium: 2.9 mmol/L — ABNORMAL LOW (ref 3.5–5.1)
Sodium: 141 mmol/L (ref 135–145)
Total Bilirubin: 0.6 mg/dL (ref 0.3–1.2)
Total Protein: 9.1 g/dL — ABNORMAL HIGH (ref 6.5–8.1)

## 2019-08-30 MED ORDER — OXYCODONE-ACETAMINOPHEN 5-325 MG PO TABS
1.0000 | ORAL_TABLET | Freq: Once | ORAL | Status: AC
  Administered 2019-08-30: 1 via ORAL
  Filled 2019-08-30: qty 1

## 2019-08-30 MED ORDER — PREDNISONE 20 MG PO TABS
60.0000 mg | ORAL_TABLET | Freq: Once | ORAL | Status: AC
  Administered 2019-08-30: 60 mg via ORAL
  Filled 2019-08-30: qty 3

## 2019-08-30 MED ORDER — OXYCODONE-ACETAMINOPHEN 5-325 MG PO TABS: 1.0000 | ORAL_TABLET | ORAL | 0 refills | Status: DC | PRN

## 2019-08-30 MED ORDER — PREDNISONE 10 MG PO TABS: ORAL_TABLET | ORAL | 0 refills | Status: AC

## 2019-08-30 NOTE — ED Provider Notes (Signed)
Medical screening examination/treatment/procedure(s) were conducted as a shared visit with non-physician practitioner(s) and myself.  I personally evaluated the patient during the encounter.    53 year old female presents with bilateral ankle swelling times several weeks.  No other joint involvement.  No fever or chills.  On exam symmetrical swelling and tenderness noted to her bilateral ankles.  Suspect some kind of autoimmune etiology.  Will place on prednisone taper and refer back to her primary care doctor   Lacretia Leigh, MD 08/30/19 1200

## 2019-08-30 NOTE — Discharge Instructions (Addendum)
You have been diagnosed today with Bilateral Ankle pain and Arthritis and low potassium.  At this time there does not appear to be the presence of an emergent medical condition, however there is always the potential for conditions to change. Please read and follow the below instructions.  Please return to the Emergency Department immediately for any new or worsening symptoms. Please be sure to follow up with your Primary Care Provider within one week regarding your visit today; please call their office to schedule an appointment even if you are feeling better for a follow-up visit. You may use the pain medication Percocet as prescribed to help with severe pain. Do not drive or operate heavy machinery while taking Percocet  as it will make you drowsy.  Do not drink alcohol or take other sedating medications while taking Percocet as this will worsen side effects. Please take the steroid medication prednisone as prescribed to help with your symptoms.  Steroid medication such as prednisone can raise your blood sugar levels so please monitor these levels closely at home and adjust your home medications accordingly. Your potassium level was low today, please have this rechecked at your primary care provider's office at your next visit to ensure improvement.  Please read the attached hypokalemia handout and increase your intake of food with high levels of potassium.  Get help right away if: Your foot, leg, toes, or ankle: Tingles or becomes numb. Becomes swollen. Turns pale or blue. Your blood sugar is lower than 54 mg/dL (3 mmol/L). You get confused. You have nausea or vomiting You have trouble: Thinking clearly. Breathing. You have any new/concerning or worsening of symptoms  Please read the additional information packets attached to your discharge summary.  Do not take your medicine if  develop an itchy rash, swelling in your mouth or lips, or difficulty breathing; call 911 and seek immediate  emergency medical attention if this occurs.  Note: Portions of this text may have been transcribed using voice recognition software. Every effort was made to ensure accuracy; however, inadvertent computerized transcription errors may still be present.

## 2019-08-30 NOTE — ED Triage Notes (Signed)
Pt complaint of continued lower leg swelling and back pain since last since by primary care provider a month ago; "cannot walk without crutches."   Uses tramadol, nabumetone, and baclofen without relief.

## 2019-08-30 NOTE — ED Provider Notes (Signed)
[facility] DEPT Provider Note   CSN: [id number] Arrival date & time: 08/30/19  0825     History   Chief Complaint Chief Complaint  Patient presents with   Leg Swelling    HPI Sydney Williams is a 53 y.o. female history of diabetes, GERD, hypertension, hyperlipidemia, obesity, migraines presents today for bilateral feet swelling and pain ongoing for 1 month.  Patient reports gradual onset sharp burning pain of bilateral feet and ankles constant worsened with movement and ambulation without alleviating factors, she reports she is unable to ambulate without crutches over the past 2-3 weeks secondary to pain.  She reports mild swelling and erythema to both feet with simultaneous onset.  She reports she has had televisits with her PCP without clear diagnosis, she has been using tramadol, baclofen and nabumetone without relief.  Additionally patient reports chronic low back pain without change, mild aching sensation constant worsened with movement and palpation without alleviating factors.  Denies fever/chills, fall/injury, headache, chest pain/shortness of breath, abdominal pain, nausea/vomiting, diarrhea, dysuria/hematuria, bowel/bladder incontinence, retention, saddle area paresthesias or any additional concerns.    HPI  Past Medical History:  Diagnosis Date   Asthma    2015 hospitaliation for asthma   Diabetes mellitus    vs impaired fasting glucose   Family history of breast cancer    Fibroids 2010   Genital herpes    GERD (gastroesophageal reflux disease)    Hyperlipidemia    Hypertension    Impaired fasting blood sugar    Migraines    Obesity    Smoker     Patient Active Problem List   Diagnosis Date Noted   Family history of cervical cancer 02/02/2018   Family history of breast cancer 02/02/2018   Vitamin D deficiency 02/02/2018   Need for hepatitis C screening test 02/02/2018   Low back pain 09/29/2017   Abnormal  myocardial perfusion study    Depression 06/19/2017   Hypokalemia 06/19/2017   Hypomagnesemia 06/19/2017   Vaccine counseling 01/08/2017   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 01/08/2017   Screening for cervical cancer 01/08/2017   Vision exam with abnormal findings 01/08/2017   Snoring 09/11/2016   History of migraine 09/11/2016   Frequent headaches 09/11/2016   Muscle cramp 09/11/2016   Encounter for health maintenance examination in adult 12/04/2015   Heel pain, bilateral 12/04/2015   Screening for breast cancer 12/04/2015   Need for influenza vaccination 12/04/2015   Moderate persistent asthma 06/01/2015   Gastroesophageal reflux disease without esophagitis 06/01/2015   Rhinitis, allergic 06/01/2015   Genital herpes 11/30/2014   Smoker 11/30/2014   Essential hypertension 11/30/2014   Hyperlipidemia 11/30/2014   Impaired fasting blood sugar 11/30/2014   Obesity 07/02/2012    Past Surgical History:  Procedure Laterality Date   COLONOSCOPY  03/13/2016   Dr. Carlean Purl, normal, repeat 2027   CYST EXCISION     left neck/postauricular region, benign   LAPAROSCOPIC ABDOMINAL EXPLORATION     removal of ectopic preg   LEFT HEART CATH AND CORONARY ANGIOGRAPHY N/A 06/23/2017   Procedure: LEFT HEART CATH AND CORONARY ANGIOGRAPHY;  Surgeon: Nelva Bush, MD;  Location: [facility];  Service: Cardiovascular;  Laterality: N/A;   TUBAL LIGATION       OB History    Gravida  5   Para  1   Term  1   Preterm      AB  2   Living  3     SAB  1  TAB      Ectopic  1   Multiple      Live Births               Home Medications    Prior to Admission medications   Medication Sig Start Date End Date Taking? Authorizing Provider  acyclovir (ZOVIRAX) 200 MG capsule Take 1 capsule (200 mg total) by mouth 2 (two) times daily. 08/04/19   Fulp, Cammie, MD  albuterol (PROVENTIL HFA;VENTOLIN HFA) 108 (90 Base) MCG/ACT inhaler Inhale 1-2  puffs into the lungs every 6 (six) hours as needed for wheezing or shortness of breath. 02/03/18   Tysinger, Camelia Eng, PA-C  amLODipine (NORVASC) 10 MG tablet Take 1 tablet (10 mg total) by mouth daily. To lower blood pressure 06/03/19   Fulp, Cammie, MD  aspirin (EQ ASPIRIN ADULT LOW DOSE) 81 MG EC tablet Take 1 tablet (81 mg total) by mouth daily. Swallow whole. 02/03/18   Tysinger, Camelia Eng, PA-C  atorvastatin (LIPITOR) 40 MG tablet Take 1 tablet (40 mg total) by mouth daily. 07/02/19   Fulp, Cammie, MD  baclofen (LIORESAL) 10 MG tablet Take 1-2 tablets (10-20 mg total) by mouth 3 (three) times daily as needed for muscle spasms. 07/28/19   Hilts, Legrand Como, MD  buPROPion (WELLBUTRIN XL) 150 MG 24 hr tablet TAKE 1 TABLET BY MOUTH ONCE DAILY Patient not taking: Reported on 07/09/2019 12/11/18   Tysinger, Camelia Eng, PA-C  EPINEPHrine (EPIPEN 2-PAK) 0.3 mg/0.3 mL IJ SOAJ injection Inject 0.3 mLs (0.3 mg total) into the muscle once as needed for up to 1 dose (for severe allergic reaction). CAll 911 immediately if you have to use this medicine 07/12/18   Baird Cancer, Vilinda Blanks, PA-C  Fluticasone-Salmeterol (ADVAIR) 250-50 MCG/DOSE AEPB Inhale 1 puff into the lungs 2 (two) times daily. 02/03/18   Tysinger, Camelia Eng, PA-C  furosemide (LASIX) 20 MG tablet One pill once per day if needed for swelling 08/04/19   Fulp, Cammie, MD  gabapentin (NEURONTIN) 300 MG capsule Take 300 mg by mouth at bedtime as needed (back pain).  08/20/18   [provider]  hydrochlorothiazide (HYDRODIURIL) 25 MG tablet Take 1 tablet (25 mg total) by mouth daily. To lower blood pressure 04/22/19   Fulp, Cammie, MD  ibuprofen (ADVIL) 600 MG tablet Take 1 tablet (600 mg total) by mouth every 8 (eight) hours as needed for moderate pain. Take after eating 08/04/19   Fulp, Cammie, MD  metFORMIN (GLUCOPHAGE) 500 MG tablet Take 1 tablet (500 mg total) by mouth daily with breakfast. 08/04/19   Fulp, Cammie, MD  metoprolol tartrate (LOPRESSOR) 50 MG tablet Take 1  tablet (50 mg total) by mouth 2 (two) times daily. 06/10/19   Fulp, Cammie, MD  nabumetone (RELAFEN) 750 MG tablet Take 1 tablet (750 mg total) by mouth 2 (two) times daily as needed. 07/28/19   Hilts, Legrand Como, MD  oxyCODONE-acetaminophen (PERCOCET/ROXICET) 5-325 MG tablet Take 1-2 tablets by mouth every 4 (four) hours as needed for severe pain. 08/30/19   Nuala Alpha A, PA-C  predniSONE (DELTASONE) 10 MG tablet Take 5 tablets (50 mg total) by mouth daily for 1 day, THEN 4 tablets (40 mg total) daily for 1 day, THEN 3 tablets (30 mg total) daily for 1 day, THEN 2 tablets (20 mg total) daily for 1 day, THEN 1 tablet (10 mg total) daily for 1 day. You have been given your first dose of prednisone in the emergency department today.  Begin taking the prednisone medication taper  as above starting tomorrow.. 08/30/19 09/04/19  Nuala Alpha A, PA-C  PRILOSEC OTC 20 MG tablet Take 20 mg by mouth 2 (two) times daily. 08/20/18   [provider]  traMADol (ULTRAM) 50 MG tablet Take 1 tablet (50 mg total) by mouth every 6 (six) hours as needed. 07/28/19   Hilts, Legrand Como, MD  Vitamin D, Cholecalciferol, 25 MCG (1000 UT) TABS Take 1 tablet by mouth once daily 02/18/19   Tysinger, Camelia Eng, PA-C    Family History Family History  Problem Relation Age of Onset   Diabetes Mother    Hypertension Mother    Aneurysm Mother    Stroke Mother    Cancer Mother        cervical cancer   Cancer Maternal Aunt        breast   Cancer Cousin        breast/breast   Heart disease Neg Hx    Colon cancer Neg Hx     Social History Social History   Tobacco Use   Smoking status: Current Every Day Smoker    Packs/day: 0.25    Years: 14.00    Pack years: 3.50    Types: Cigarettes   Smokeless tobacco: Never Used  Substance Use Topics   Alcohol use: Yes    Comment: occ   Drug use: No     Allergies   Lisinopril, Potassium chloride, Pneumococcal vaccines, and Vicodin  [hydrocodone-acetaminophen]   Review of Systems Review of Systems Ten systems are reviewed and are negative for acute change except as noted in the HPI   Physical Exam Updated Vital Signs BP 118/89 (BP Location: Left Arm)    Pulse 79    Temp 98.2 F (36.8 C) (Oral)    Resp 20    Ht 5\' 11"  (1.803 m)    Wt 110.2 kg    LMP 06/29/2015 (Exact Date)    SpO2 100%    BMI 33.89 kg/m   Physical Exam Constitutional:      General: She is not in acute distress.    Appearance: Normal appearance. She is well-developed. She is not ill-appearing or diaphoretic.  HENT:     Head: Normocephalic and atraumatic.     Right Ear: External ear normal.     Left Ear: External ear normal.     Nose: Nose normal.  Eyes:     General: Vision grossly intact. Gaze aligned appropriately.     Pupils: Pupils are equal, round, and reactive to light.  Neck:     Musculoskeletal: Normal range of motion.     Trachea: Trachea and phonation normal. No tracheal deviation.  Cardiovascular:     Pulses:          Dorsalis pedis pulses are 2+ on the right side and 2+ on the left side.  Pulmonary:     Effort: Pulmonary effort is normal. No respiratory distress.  Abdominal:     General: There is no distension.     Palpations: Abdomen is soft.     Tenderness: There is no abdominal tenderness. There is no guarding or rebound.  Musculoskeletal: Normal range of motion.  Feet:     Right foot:     Protective Sensation: 5 sites tested. 5 sites sensed.     Left foot:     Protective Sensation: 5 sites tested. 5 sites sensed.     Comments: Diffuse tenderness of bilateral feet and ankles with mild swelling no skin break, full or induration. Skin:  General: Skin is warm and dry.  Neurological:     Mental Status: She is alert.     GCS: GCS eye subscore is 4. GCS verbal subscore is 5. GCS motor subscore is 6.     Comments: Speech is clear and goal oriented, follows commands Major Cranial nerves without deficit, no facial  droop Moves extremities without ataxia, coordination intact  Psychiatric:        Behavior: Behavior normal.    ED Treatments / Results  Labs (all labs ordered are listed, but only abnormal results are displayed) Labs Reviewed  COMPREHENSIVE METABOLIC PANEL - Abnormal; Notable for the following components:      Result Value   Potassium 2.9 (*)    Glucose, Bld 115 (*)    Total Protein 9.1 (*)    All other components within normal limits  CBC WITH DIFFERENTIAL/PLATELET    EKG None  Radiology Dg Ankle Complete Left  Result Date: 08/30/2019 CLINICAL DATA:  Pain and swelling EXAM: LEFT ANKLE COMPLETE - 3+ VIEW; LEFT FOOT - COMPLETE 3+ VIEW COMPARISON:  01/23/2017 FINDINGS: No acute fracture or dislocation. Pes planus alignment. Enthesopathic changes at the fifth metatarsal base, unchanged from prior. Mild joint space narrowing throughout the midfoot and at the first MTP joint. Small bidirectional calcaneal enthesophytes. Ankle mortise congruent. Mild diffuse soft tissue swelling at the ankle. IMPRESSION: Soft tissue swelling at the left ankle/hindfoot. No acute osseous abnormality. Electronically Signed   By: Davina Poke M.D.   On: 08/30/2019 09:56   Dg Ankle Complete Right  Result Date: 08/30/2019 CLINICAL DATA:  Pain and swelling EXAM: RIGHT ANKLE - COMPLETE 3+ VIEW; RIGHT FOOT COMPLETE - 3+ VIEW COMPARISON:  07/11/2019 FINDINGS: No acute fracture or dislocation. Pes planus alignment. Bidirectional calcaneal enthesophytes. Mild first MTP joint space narrowing. Mild degenerative changes with dorsal proliferation within the midfoot. Mild diffuse soft tissue swelling at the ankle/hindfoot. IMPRESSION: Soft tissue swelling of the right ankle/hindfoot. No acute osseous abnormality. Electronically Signed   By: Davina Poke M.D.   On: 08/30/2019 09:58   Dg Foot Complete Left  Result Date: 08/30/2019 CLINICAL DATA:  Pain and swelling EXAM: LEFT ANKLE COMPLETE - 3+ VIEW; LEFT FOOT -  COMPLETE 3+ VIEW COMPARISON:  01/23/2017 FINDINGS: No acute fracture or dislocation. Pes planus alignment. Enthesopathic changes at the fifth metatarsal base, unchanged from prior. Mild joint space narrowing throughout the midfoot and at the first MTP joint. Small bidirectional calcaneal enthesophytes. Ankle mortise congruent. Mild diffuse soft tissue swelling at the ankle. IMPRESSION: Soft tissue swelling at the left ankle/hindfoot. No acute osseous abnormality. Electronically Signed   By: Davina Poke M.D.   On: 08/30/2019 09:56   Dg Foot Complete Right  Result Date: 08/30/2019 CLINICAL DATA:  Pain and swelling EXAM: RIGHT ANKLE - COMPLETE 3+ VIEW; RIGHT FOOT COMPLETE - 3+ VIEW COMPARISON:  07/11/2019 FINDINGS: No acute fracture or dislocation. Pes planus alignment. Bidirectional calcaneal enthesophytes. Mild first MTP joint space narrowing. Mild degenerative changes with dorsal proliferation within the midfoot. Mild diffuse soft tissue swelling at the ankle/hindfoot. IMPRESSION: Soft tissue swelling of the right ankle/hindfoot. No acute osseous abnormality. Electronically Signed   By: Davina Poke M.D.   On: 08/30/2019 09:58    Procedures Procedures (including critical care time)  Medications Ordered in ED Medications  oxyCODONE-acetaminophen (PERCOCET/ROXICET) 5-325 MG per tablet 1 tablet (1 tablet Oral Given 08/30/19 1220)  predniSONE (DELTASONE) tablet 60 mg (60 mg Oral Given 08/30/19 1220)     Initial Impression /  Assessment and Plan / ED Course  I have reviewed the triage vital signs and the nursing notes.  Pertinent labs & imaging results that were available during my care of the patient were reviewed by me and considered in my medical decision making (see chart for details).    DG left ankle:  IMPRESSION:  Soft tissue swelling at the left ankle/hindfoot. No acute osseous  abnormality.   DG left foot:  IMPRESSION:  Soft tissue swelling at the left ankle/hindfoot. No acute  osseous  abnormality.   DG right ankle:  IMPRESSION:  Soft tissue swelling of the right ankle/hindfoot. No acute osseous  abnormality.   DG right foot:  IMPRESSION:  Soft tissue swelling of the right ankle/hindfoot. No acute osseous  abnormality.   CBC within normal limits CMP with glucose 115, potassium 2.9, total protein 9.1 -------------------- Patient seen and evaluated by Dr. Zenia Resides, advises will need PCP follow-up for rheumatoid laboratory work-up.  No evidence of DVT, septic arthritis, cellulitis, compartment syndrome or other life-threatening pathologies at this time.  Will begin patient on prednisone taper and give short course pain medication.  Patient will increase potassium intake through diet, she has listed allergy to potassium supplements today, I encouraged her to follow-up with PCP this week for recheck of potassium level.  Patient advised that steroid medications will increase her blood sugar and she is aware to monitor her sugars closely at home and treat accordingly with home medications.  6 pills Percocet given for patient's severe bilateral ankle pain.  Patient and husband informed of narcotic precautions and state understanding, husband will drive home.  PMD P reviewed and patient without active narcotic prescriptions in the last 1 month, 07/28/2019, 30 pills tramadol.  At this time there does not appear to be any evidence of an acute emergency medical condition and the patient appears stable for discharge with appropriate outpatient follow up. Diagnosis was discussed with patient who verbalizes understanding of care plan and is agreeable to discharge. I have discussed return precautions with patient who verbalizes understanding of return precautions. Patient encouraged to follow-up with their PCP.  All questions answered.  Patient has been discharged in good condition.   Note: Portions of this report may have been transcribed using voice recognition software. Every effort  was made to ensure accuracy; however, inadvertent computerized transcription errors may still be present. Final Clinical Impressions(s) / ED Diagnoses   Final diagnoses:  Bilateral ankle pain, unspecified chronicity  Arthralgia of both ankles  Hypokalemia    ED Discharge Orders         Ordered    oxyCODONE-acetaminophen (PERCOCET/ROXICET) 5-325 MG tablet  Every 4 hours PRN     08/30/19 1343    predniSONE (DELTASONE) 10 MG tablet     08/30/19 1343           Gari Crown 08/30/19 1550    Lacretia Leigh, MD 09/02/19 1325

## 2019-09-01 MED FILL — ?METFORMIN HCL 500MG TABLET: 500 | 30 days supply | Qty: 30 | Fill #1

## 2019-09-01 MED FILL — ?HYDROCHLOROTHIAZIDE 25MG T: 25 | 30 days supply | Qty: 30 | Fill #3

## 2019-09-03 MED FILL — ?AMLODIPINE BESYLATE 10 MG: 10 | 30 days supply | Qty: 30 | Fill #3

## 2019-09-03 MED FILL — ?ATORVASTATIN 40MG TABLET: 40 | 30 days supply | Qty: 30 | Fill #2

## 2019-09-03 MED FILL — ?METOPROLOL 50 MG TABLET: 50 | 30 days supply | Qty: 60 | Fill #2

## 2019-09-12 ENCOUNTER — Other Ambulatory Visit: Payer: Self-pay

## 2019-09-12 ENCOUNTER — Encounter (HOSPITAL_COMMUNITY): Payer: Self-pay

## 2019-09-12 DIAGNOSIS — M62830 Muscle spasm of back: Secondary | ICD-10-CM | POA: Insufficient documentation

## 2019-09-12 DIAGNOSIS — F17210 Nicotine dependence, cigarettes, uncomplicated: Secondary | ICD-10-CM | POA: Insufficient documentation

## 2019-09-12 DIAGNOSIS — Z7984 Long term (current) use of oral hypoglycemic drugs: Secondary | ICD-10-CM | POA: Insufficient documentation

## 2019-09-12 DIAGNOSIS — Z79899 Other long term (current) drug therapy: Secondary | ICD-10-CM | POA: Insufficient documentation

## 2019-09-12 DIAGNOSIS — E119 Type 2 diabetes mellitus without complications: Secondary | ICD-10-CM | POA: Insufficient documentation

## 2019-09-12 DIAGNOSIS — J45909 Unspecified asthma, uncomplicated: Secondary | ICD-10-CM | POA: Insufficient documentation

## 2019-09-12 DIAGNOSIS — I10 Essential (primary) hypertension: Secondary | ICD-10-CM | POA: Insufficient documentation

## 2019-09-12 DIAGNOSIS — Z7982 Long term (current) use of aspirin: Secondary | ICD-10-CM | POA: Insufficient documentation

## 2019-09-12 NOTE — ED Triage Notes (Signed)
Patient arrived via [facility] with complaints of sudden onset severe right lower back pain. Reports history of a slipped disc in 2018 but this feels different. No urinary symptoms or numbness/tingling reported. Given two doses of 50 mcg Fentanyl en route.

## 2019-09-13 ENCOUNTER — Emergency Department (HOSPITAL_COMMUNITY)
Admission: EM | Admit: 2019-09-13 | Discharge: 2019-09-13 | Disposition: A | Discharge status: Home / Self Care | Payer: Self-pay | Attending: Emergency Medicine | Admitting: Emergency Medicine

## 2019-09-13 DIAGNOSIS — M62830 Muscle spasm of back: Secondary | ICD-10-CM

## 2019-09-13 MED ORDER — KETOROLAC TROMETHAMINE 15 MG/ML IJ SOLN
15.0000 mg | Freq: Once | INTRAMUSCULAR | Status: AC
  Administered 2019-09-13: 15 mg via INTRAVENOUS
  Filled 2019-09-13: qty 1

## 2019-09-13 NOTE — ED Provider Notes (Signed)
[facility] DEPT Provider Note  CSN: [id number] Arrival date & time: 09/12/19 2325  Chief Complaint(s) Back Pain  HPI Sydney Williams is a 53 y.o. female with a past medical history of chronic lower back pain and recurrent muscle spasms on baclofen who presents to the emergency department with sudden onset right mid and lower back pain felt 2 hours prior to arrival.  Patient reports that she was lying in bed.  When she tried to rollover, she felt her back spasming but did not release as it normally does.  Pain is worse with movement and palpation.  Alleviated only by sitting up.  She denies any falls or trauma.  No lower extremity weakness or loss of sensation.  No bladder/bowel incontinence.  HPI  Past Medical History Past Medical History:  Diagnosis Date  . Asthma    2015 hospitaliation for asthma  . Diabetes mellitus    vs impaired fasting glucose  . Family history of breast cancer   . Fibroids 2010  . Genital herpes   . GERD (gastroesophageal reflux disease)   . Hyperlipidemia   . Hypertension   . Impaired fasting blood sugar   . Migraines   . Obesity   . Smoker    Patient Active Problem List   Diagnosis Date Noted  . Family history of cervical cancer 02/02/2018  . Family history of breast cancer 02/02/2018  . Vitamin D deficiency 02/02/2018  . Need for hepatitis C screening test 02/02/2018  . Low back pain 09/29/2017  . Abnormal myocardial perfusion study   . Depression 06/19/2017  . Hypokalemia 06/19/2017  . Hypomagnesemia 06/19/2017  . Vaccine counseling 01/08/2017  . 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 Screening for cervical cancer 01/08/2017  . Vision exam with abnormal findings 01/08/2017  . Snoring 09/11/2016  . History of migraine 09/11/2016  . Frequent headaches 09/11/2016  . Muscle cramp 09/11/2016  . Encounter for health maintenance examination in adult 12/04/2015  . Heel pain, bilateral 12/04/2015  .  Screening for breast cancer 12/04/2015  . Need for influenza vaccination 12/04/2015  . Moderate persistent asthma 06/01/2015  . Gastroesophageal reflux disease without esophagitis 06/01/2015  . Rhinitis, allergic 06/01/2015  . Genital herpes 11/30/2014  . Smoker 11/30/2014  . Essential hypertension 11/30/2014  . Hyperlipidemia 11/30/2014  . Impaired fasting blood sugar 11/30/2014  . Obesity 07/02/2012   Home Medication(s) Prior to Admission medications   Medication Sig Start Date End Date Taking? Authorizing Provider  acyclovir (ZOVIRAX) 200 MG capsule Take 1 capsule (200 mg total) by mouth 2 (two) times daily. 08/04/19   Fulp, Cammie, MD  albuterol (PROVENTIL HFA;VENTOLIN HFA) 108 (90 Base) MCG/ACT inhaler Inhale 1-2 puffs into the lungs every 6 (six) hours as needed for wheezing or shortness of breath. 02/03/18   Tysinger, Camelia Eng, PA-C  amLODipine (NORVASC) 10 MG tablet Take 1 tablet (10 mg total) by mouth daily. To lower blood pressure 06/03/19   Fulp, Cammie, MD  aspirin (EQ ASPIRIN ADULT LOW DOSE) 81 MG EC tablet Take 1 tablet (81 mg total) by mouth daily. Swallow whole. 02/03/18   Tysinger, Camelia Eng, PA-C  atorvastatin (LIPITOR) 40 MG tablet Take 1 tablet (40 mg total) by mouth daily. 07/02/19   Fulp, Cammie, MD  baclofen (LIORESAL) 10 MG tablet Take 1-2 tablets (10-20 mg total) by mouth 3 (three) times daily as needed for muscle spasms. 07/28/19   Hilts, Legrand Como, MD  buPROPion (WELLBUTRIN XL) 150 MG 24 hr tablet TAKE 1 TABLET  BY MOUTH ONCE DAILY Patient not taking: Reported on 07/09/2019 12/11/18   Tysinger, Camelia Eng, PA-C  EPINEPHrine (EPIPEN 2-PAK) 0.3 mg/0.3 mL IJ SOAJ injection Inject 0.3 mLs (0.3 mg total) into the muscle once as needed for up to 1 dose (for severe allergic reaction). CAll 911 immediately if you have to use this medicine 07/12/18   Baird Cancer, Vilinda Blanks, PA-C  Fluticasone-Salmeterol (ADVAIR) 250-50 MCG/DOSE AEPB Inhale 1 puff into the lungs 2 (two) times daily. 02/03/18   Tysinger,  Camelia Eng, PA-C  furosemide (LASIX) 20 MG tablet One pill once per day if needed for swelling 08/04/19   Fulp, Cammie, MD  gabapentin (NEURONTIN) 300 MG capsule Take 300 mg by mouth at bedtime as needed (back pain).  08/20/18   [provider]  hydrochlorothiazide (HYDRODIURIL) 25 MG tablet Take 1 tablet (25 mg total) by mouth daily. To lower blood pressure 04/22/19   Fulp, Cammie, MD  ibuprofen (ADVIL) 600 MG tablet Take 1 tablet (600 mg total) by mouth every 8 (eight) hours as needed for moderate pain. Take after eating 08/04/19   Fulp, Cammie, MD  metFORMIN (GLUCOPHAGE) 500 MG tablet Take 1 tablet (500 mg total) by mouth daily with breakfast. 08/04/19   Fulp, Cammie, MD  metoprolol tartrate (LOPRESSOR) 50 MG tablet Take 1 tablet (50 mg total) by mouth 2 (two) times daily. 06/10/19   Fulp, Cammie, MD  nabumetone (RELAFEN) 750 MG tablet Take 1 tablet (750 mg total) by mouth 2 (two) times daily as needed. 07/28/19   Hilts, Legrand Como, MD  oxyCODONE-acetaminophen (PERCOCET/ROXICET) 5-325 MG tablet Take 1-2 tablets by mouth every 4 (four) hours as needed for severe pain. 08/30/19   Nuala Alpha A, PA-C  PRILOSEC OTC 20 MG tablet Take 20 mg by mouth 2 (two) times daily. 08/20/18   [provider]  traMADol (ULTRAM) 50 MG tablet Take 1 tablet (50 mg total) by mouth every 6 (six) hours as needed. 07/28/19   Hilts, Legrand Como, MD  Vitamin D, Cholecalciferol, 25 MCG (1000 UT) TABS Take 1 tablet by mouth once daily 02/18/19   Tysinger, Camelia Eng, PA-C                                                                                                                                    Past Surgical History Past Surgical History:  Procedure Laterality Date  . COLONOSCOPY  03/13/2016   Dr. Carlean Purl, normal, repeat 2027  . CYST EXCISION     left neck/postauricular region, benign  . LAPAROSCOPIC ABDOMINAL EXPLORATION     removal of ectopic preg  . LEFT HEART CATH AND CORONARY ANGIOGRAPHY N/A 06/23/2017    Procedure: LEFT HEART CATH AND CORONARY ANGIOGRAPHY;  Surgeon: Nelva Bush, MD;  Location: [facility];  Service: Cardiovascular;  Laterality: N/A;  . TUBAL LIGATION     Family History Family History  Problem Relation Age of Onset  . Diabetes Mother   .  Hypertension Mother   . Aneurysm Mother   . Stroke Mother   . Cancer Mother        cervical cancer  . Cancer Maternal Aunt        breast  . Cancer Cousin        breast/breast  . Heart disease Neg Hx   . Colon cancer Neg Hx     Social History Social History   Tobacco Use  . Smoking status: Current Every Day Smoker    Packs/day: 0.25    Years: 14.00    Pack years: 3.50    Types: Cigarettes  . Smokeless tobacco: Never Used  Substance Use Topics  . Alcohol use: Yes    Comment: occ  . Drug use: No   Allergies Lisinopril, Potassium chloride, Pneumococcal vaccines, and Vicodin [hydrocodone-acetaminophen]  Review of Systems Review of Systems All other systems are reviewed and are negative for acute change except as noted in the HPI  Physical Exam Vital Signs  I have reviewed the triage vital signs BP 132/86 (BP Location: Right Arm)   Pulse (!) 107   Temp 98.6 F (37 C) (Oral)   Resp 18   Ht 5\' 11"  (1.803 m)   Wt 110.2 kg   LMP 06/29/2015 (Exact Date)   SpO2 97%   BMI 33.89 kg/m   Physical Exam Vitals signs reviewed.  Constitutional:      General: She is not in acute distress.    Appearance: She is well-developed. She is obese. She is not diaphoretic.  HENT:     Head: Normocephalic and atraumatic.     Right Ear: External ear normal.     Left Ear: External ear normal.     Nose: Nose normal.  Eyes:     General: No scleral icterus.    Conjunctiva/sclera: Conjunctivae normal.  Neck:     Musculoskeletal: Normal range of motion.     Trachea: Phonation normal.  Cardiovascular:     Rate and Rhythm: Normal rate and regular rhythm.  Pulmonary:     Effort: Pulmonary effort is normal. No respiratory  distress.     Breath sounds: No stridor.  Abdominal:     General: There is no distension.  Musculoskeletal: Normal range of motion.     Thoracic back: She exhibits tenderness and spasm. She exhibits no bony tenderness.     Lumbar back: She exhibits tenderness and spasm. She exhibits no bony tenderness.       Back:  Neurological:     Mental Status: She is alert and oriented to person, place, and time.     Comments: Spine Exam: Strength: 5/5 throughout LE bilaterally (hip flexion/extension, adduction/abduction; knee flexion/extension; foot dorsiflexion/plantarflexion, inversion/eversion; great toe inversion) Sensation: Intact to light touch in proximal and distal LE bilaterally Reflexes: 1+ quadriceps and achilles reflexes   Psychiatric:        Behavior: Behavior normal.     ED Results and Treatments Labs (all labs ordered are listed, but only abnormal results are displayed) Labs Reviewed - No data to display  EKG  EKG Interpretation  Date/Time:    Ventricular Rate:    PR Interval:    QRS Duration:   QT Interval:    QTC Calculation:   R Axis:     Text Interpretation:        Radiology No results found.  Pertinent labs & imaging results that were available during my care of the patient were reviewed by me and considered in my medical decision making (see chart for details).  Medications Ordered in ED Medications  ketorolac (TORADOL) 15 MG/ML injection 15 mg (15 mg Intravenous Given 09/13/19 0123)                                                                                                                                    Procedures Procedures  (including critical care time)  Medical Decision Making / ED Course I have reviewed the nursing notes for this encounter and the patient's prior records (if available in EHR or on provided paperwork).    Sydney Williams was evaluated in Emergency Department on 09/13/2019 for the symptoms described in the history of present illness. She was evaluated in the context of the global COVID-19 pandemic, which necessitated consideration that the patient might be at risk for infection with the SARS-CoV-2 virus that causes COVID-19. Institutional protocols and algorithms that pertain to the evaluation of patients at risk for COVID-19 are in a state of rapid change based on information released by regulatory bodies including the CDC and federal and state organizations. These policies and algorithms were followed during the patient's care in the ED.  Patient presents with right thoracolumbar sacral back pain consistent with muscle spasm. No acute traumatic onset. No red flag symptoms of fever, weight loss, saddle anesthesia, weakness, fecal/urinary incontinence or urinary retention.   Provided with IV toradol and heat pack. Pain improved.  No indication for imaging emergently. Patient was recommended to continue course of scheduled NSAIDs and engage in early mobility as definitive treatment. Given additional symptomatic treatment recommendations. Return precautions discussed for worsening or new concerning symptoms.        Final Clinical Impression(s) / ED Diagnoses Final diagnoses:  Muscle spasm of back    The patient appears reasonably screened and/or stabilized for discharge and I doubt any other medical condition or other [facility] requiring further screening, evaluation, or treatment in the ED at this time prior to discharge.  Disposition: Discharge  Condition: Good  I have discussed the results, Dx and Tx plan with the patient who expressed understanding and agree(s) with the plan. Discharge instructions discussed at great length. The patient was given strict return precautions who verbalized understanding of the instructions. No further questions at time of discharge.    ED Discharge Orders    None         Follow Up: Antony Blackbird, MD [place] [id number] [phone]  Schedule an appointment as soon as possible  for a visit  If symptoms do not improve or  worsen in 1-2 weeks      This chart was dictated using voice recognition software.  Despite best efforts to proofread,  errors can occur which can change the documentation meaning.   Fatima Blank, MD 09/13/19 [phone]

## 2019-09-13 NOTE — Discharge Instructions (Signed)
You may use over-the-counter Motrin (Ibuprofen), Acetaminophen (Tylenol), topical muscle creams such as SalonPas, Icy Hot, Bengay, etc. Please stretch, apply heat, and have massage therapy for additional assistance. ° °

## 2019-09-16 ENCOUNTER — Ambulatory Visit: Payer: Self-pay | Attending: Family Medicine | Admitting: Family Medicine

## 2019-09-16 ENCOUNTER — Other Ambulatory Visit: Payer: Self-pay

## 2019-09-16 ENCOUNTER — Encounter: Payer: Self-pay | Admitting: Family Medicine

## 2019-09-16 VITALS — BP 125/81 | HR 73 | Temp 98.8°F | Ht 71.0 in | Wt 252.0 lb

## 2019-09-16 DIAGNOSIS — M2550 Pain in unspecified joint: Secondary | ICD-10-CM

## 2019-09-16 DIAGNOSIS — Z09 Encounter for follow-up examination after completed treatment for conditions other than malignant neoplasm: Secondary | ICD-10-CM

## 2019-09-16 DIAGNOSIS — F419 Anxiety disorder, unspecified: Secondary | ICD-10-CM

## 2019-09-16 DIAGNOSIS — R7303 Prediabetes: Secondary | ICD-10-CM

## 2019-09-16 DIAGNOSIS — F329 Major depressive disorder, single episode, unspecified: Secondary | ICD-10-CM

## 2019-09-16 DIAGNOSIS — Z79899 Other long term (current) drug therapy: Secondary | ICD-10-CM

## 2019-09-16 DIAGNOSIS — M5416 Radiculopathy, lumbar region: Secondary | ICD-10-CM

## 2019-09-16 DIAGNOSIS — I10 Essential (primary) hypertension: Secondary | ICD-10-CM

## 2019-09-16 DIAGNOSIS — R7301 Impaired fasting glucose: Secondary | ICD-10-CM

## 2019-09-16 DIAGNOSIS — F32A Depression, unspecified: Secondary | ICD-10-CM

## 2019-09-16 DIAGNOSIS — E559 Vitamin D deficiency, unspecified: Secondary | ICD-10-CM

## 2019-09-16 DIAGNOSIS — E782 Mixed hyperlipidemia: Secondary | ICD-10-CM

## 2019-09-16 DIAGNOSIS — Z791 Long term (current) use of non-steroidal anti-inflammatories (NSAID): Secondary | ICD-10-CM

## 2019-09-16 DIAGNOSIS — K219 Gastro-esophageal reflux disease without esophagitis: Secondary | ICD-10-CM

## 2019-09-16 DIAGNOSIS — G8929 Other chronic pain: Secondary | ICD-10-CM

## 2019-09-16 DIAGNOSIS — E119 Type 2 diabetes mellitus without complications: Secondary | ICD-10-CM

## 2019-09-16 DIAGNOSIS — E876 Hypokalemia: Secondary | ICD-10-CM

## 2019-09-16 LAB — POCT GLYCOSYLATED HEMOGLOBIN (HGB A1C): Hemoglobin A1C: 6.8 % — AB (ref 4.0–5.6)

## 2019-09-16 LAB — GLUCOSE, POCT (MANUAL RESULT ENTRY): POC Glucose: 119 mg/dL — AB (ref 70–99)

## 2019-09-16 MED ORDER — METFORMIN HCL 500 MG PO TABS: 500.0000 mg | ORAL_TABLET | Freq: Two times a day (BID) | ORAL | 1 refills | Status: DC

## 2019-09-16 MED ORDER — OMEPRAZOLE 40 MG PO CPDR: 40.0000 mg | DELAYED_RELEASE_CAPSULE | Freq: Two times a day (BID) | ORAL | 3 refills | Status: DC

## 2019-09-16 MED ORDER — TRAMADOL HCL 50 MG PO TABS: 50.0000 mg | ORAL_TABLET | Freq: Two times a day (BID) | ORAL | 1 refills | Status: DC

## 2019-09-16 MED FILL — traMADol HCL 50 MG TABS: 50 | 30 days supply | Qty: 60 | Fill #0

## 2019-09-16 MED FILL — OMEPRAZOLE DR 40 MG CAPSULE: 40 | 30 days supply | Qty: 60 | Fill #0

## 2019-09-16 MED FILL — ?METFORMIN HCL 500MG TABLET: 500 | 30 days supply | Qty: 60 | Fill #0

## 2019-09-16 NOTE — Progress Notes (Signed)
Pt. Is here for HFU. Pt. Stated she is still having back pain and legs pain.

## 2019-09-16 NOTE — Progress Notes (Signed)
Established Patient Office Visit  Subjective:  Patient ID: Sydney Williams, female    DOB: 12/18/1965  Age: 53 y.o. MRN: [id number]  CC:  Chief Complaint  Patient presents with  . Hospitalization Follow-up   Follow-up of ED visits  HPI Sydney Williams, 53 yo African-American female, seen in follow-up of chronic medical issues and patient is s/p ED visits for ankle/foot pain and back pain with muscle spasms.  Patient reports that on the day of her ED visit for back pain, she had sudden onset of sharp, stabbing back pain as well as muscle tightness after turning over in her bed.  Patient states that she was in so much pain that her husband called EMS to take her to the hospital.  She reports that she has had issues with her back since a workplace injury in the past.  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  She states that she was told that she would need surgery as her back pain would worsen over time.  She states that she was let go from her job and therefore could not afford to have any further interventions regarding her back pain.         She has noticed starting earlier this year that she has recurrent pain and swelling in her ankles.  She was seen in the emergency department and provided with crutches at one point.  She states that she has been using crutches now to help with ambulation for the last 3 months.  She states that when she was on the prednisone to help with her back pain she actually had improvement in joint pain in her ankles, knees and back.  She was able to ambulate with the use of only one crutch at that time.  Once she finished the prednisone, all of her pain returned.  She also reports that she is starting to have pain beneath each armpit secondary to the use of crutches as she states that she cannot ambulate/walk without using the crutches due to her ankle pain if she feels as if her ankles will give away/break when she is walking.  She does have family  history of 1 cousin having lupus otherwise no known connective tissue disease but other family members have had arthritis.         She reports that within the past 3 months she has stopped drinking alcohol, stop drinking soda and juices and only drinks water.  She is also trying to make other changes in her diet to help with the control of her blood sugar since finding out that she was prediabetic.  She reports that she is also trying to eat a banana daily after finding out that her potassium was low at her recent emergency department visit.  She feels however that she is not really having any change in her weight which she believes is secondary to her inability to be mobile.  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  Patient was asked if she believes that she is having some depression related to her chronic pain and health issues.  Patient reports that she believes that she may have some mild depression.  She denies any suicidal thoughts or ideations.  She reports that she does continue to take her Wellbutrin.  She continues to feel very fatigued throughout the  day.  She denies any increased thirst or urinary frequency related to her prediabetes.  She does have a history of vitamin D deficiency but is not currently taking a vitamin D supplement.  She is taking her blood pressure medication and denies headache or dizziness related to her blood pressure.          She reports that she is currently taking Relafen in the mornings and taking ibuprofen in the evenings to help with back pain.  Patient states that she was told at her last visit that she could not take both the Relafen and ibuprofen therefore she is not taking them at the same time.  She also takes tramadol.  She reports that she has difficulty sleeping secondary to her pain.  Upon questioning, she also  reports acid reflux symptoms such as burping/belching, occasional mid upper abdominal discomfort and backwash of bad tasting fluid into her mouth and throat at times.  She denies any blood in the stool and no black stools.  Past Medical History:  Diagnosis Date  . Asthma    2015 hospitaliation for asthma  . Diabetes mellitus    vs impaired fasting glucose  . Family history of breast cancer   . Fibroids 2010  . Genital herpes   . GERD (gastroesophageal reflux disease)   . Hyperlipidemia   . Hypertension   . Impaired fasting blood sugar   . Migraines   . Obesity   . Smoker     Past Surgical History:  Procedure Laterality Date  . COLONOSCOPY  03/13/2016   Dr. Carlean Purl, normal, repeat 2027  . CYST EXCISION     left neck/postauricular region, benign  . LAPAROSCOPIC ABDOMINAL EXPLORATION     removal of ectopic preg  . LEFT HEART CATH AND CORONARY ANGIOGRAPHY N/A 06/23/2017   Procedure: LEFT HEART CATH AND CORONARY ANGIOGRAPHY;  Surgeon: Nelva Bush, MD;  Location: [facility];  Service: Cardiovascular;  Laterality: N/A;  . TUBAL LIGATION      Family History  Problem Relation Age of Onset  . Diabetes Mother   . Hypertension Mother   . Aneurysm Mother   . Stroke Mother   . Cancer Mother        cervical cancer  . Cancer Maternal Aunt        breast  . Cancer Cousin        breast/breast  . Heart disease Neg Hx   . Colon cancer Neg Hx     Social History   Socioeconomic History  . Marital status: Married    Spouse name: Not on file  . Number of children: Not on file  . Years of education: Not on file  . Highest education level: Not on file  Occupational History  . Not on file  Social Needs  . Financial resource strain: Not on file  . Food insecurity    Worry: Not on file    Inability: Not on file  . Transportation needs    Medical: Not on file    Non-medical: Not on file  Tobacco Use  . Smoking status: Current Every Day Smoker    Packs/day: 0.25     Years: 14.00    Pack years: 3.50    Types: Cigarettes  . Smokeless tobacco: Never Used  Substance and Sexual Activity  . Alcohol use: Yes    Comment: occ  . Drug use: No  . Sexual activity: Yes    Birth control/protection: Surgical  Lifestyle  . Physical  activity    Days per week: Not on file    Minutes per session: Not on file  . Stress: Not on file  Relationships  . Social Herbalist on phone: Not on file    Gets together: Not on file    Attends religious service: Not on file    Active member of club or organization: Not on file    Attends meetings of clubs or organizations: Not on file    Relationship status: Not on file  . Intimate partner violence    Fear of current or ex partner: Not on file    Emotionally abused: Not on file    Physically abused: Not on file    Forced sexual activity: Not on file  Other Topics Concern  . Not on file  Social History Narrative   Married, and her granddaughter lives with her, works as Quarry manager at [employer], exercise - activity at work, walking.  12/2016    Outpatient Medications Prior to Visit  Medication Sig Dispense Refill  . furosemide (LASIX) 20 MG tablet One pill once per day if needed for swelling 20 tablet 0  . gabapentin (NEURONTIN) 300 MG capsule Take 300 mg by mouth at bedtime as needed (back pain).   0  . acyclovir (ZOVIRAX) 200 MG capsule Take 1 capsule (200 mg total) by mouth 2 (two) times daily. 180 capsule 3  . albuterol (PROVENTIL HFA;VENTOLIN HFA) 108 (90 Base) MCG/ACT inhaler Inhale 1-2 puffs into the lungs every 6 (six) hours as needed for wheezing or shortness of breath. 1 Inhaler 1  . amLODipine (NORVASC) 10 MG tablet Take 1 tablet (10 mg total) by mouth daily. To lower blood pressure 90 tablet 1  . aspirin (EQ ASPIRIN ADULT LOW DOSE) 81 MG EC tablet Take 1 tablet (81 mg total) by mouth daily. Swallow whole. 90 tablet 3  . atorvastatin (LIPITOR) 40 MG tablet Take 1 tablet (40 mg total) by mouth daily. 30  tablet 2  . baclofen (LIORESAL) 10 MG tablet Take 1-2 tablets (10-20 mg total) by mouth 3 (three) times daily as needed for muscle spasms. 90 each 1  . buPROPion (WELLBUTRIN XL) 150 MG 24 hr tablet TAKE 1 TABLET BY MOUTH ONCE DAILY (Patient not taking: Reported on 07/09/2019) 90 tablet 0  . EPINEPHrine (EPIPEN 2-PAK) 0.3 mg/0.3 mL IJ SOAJ injection Inject 0.3 mLs (0.3 mg total) into the muscle once as needed for up to 1 dose (for severe allergic reaction). CAll 911 immediately if you have to use this medicine 1 Device 1  . Fluticasone-Salmeterol (ADVAIR) 250-50 MCG/DOSE AEPB Inhale 1 puff into the lungs 2 (two) times daily. 60 each 11  . hydrochlorothiazide (HYDRODIURIL) 25 MG tablet Take 1 tablet (25 mg total) by mouth daily. To lower blood pressure 90 tablet 3  . ibuprofen (ADVIL) 600 MG tablet Take 1 tablet (600 mg total) by mouth every 8 (eight) hours as needed for moderate pain. Take after eating 60 tablet 1  . metFORMIN (GLUCOPHAGE) 500 MG tablet Take 1 tablet (500 mg total) by mouth daily with breakfast. 90 tablet 1  . metoprolol tartrate (LOPRESSOR) 50 MG tablet Take 1 tablet (50 mg total) by mouth 2 (two) times daily. 60 tablet 2  . nabumetone (RELAFEN) 750 MG tablet Take 1 tablet (750 mg total) by mouth 2 (two) times daily as needed. 60 tablet 6  . oxyCODONE-acetaminophen (PERCOCET/ROXICET) 5-325 MG tablet Take 1-2 tablets by mouth every 4 (four) hours as needed  for severe pain. 6 tablet 0  . PRILOSEC OTC 20 MG tablet Take 20 mg by mouth 2 (two) times daily.  2  . traMADol (ULTRAM) 50 MG tablet Take 1 tablet (50 mg total) by mouth every 6 (six) hours as needed. 30 tablet 0  . Vitamin D, Cholecalciferol, 25 MCG (1000 UT) TABS Take 1 tablet by mouth once daily 90 tablet 0   No facility-administered medications prior to visit.     Allergies  Allergen Reactions  . Lisinopril Swelling  . Potassium Chloride Shortness Of Breath and Swelling    Tolerates IV KCl, reaction only to PO product  .  Pneumococcal Vaccines Swelling and Other (See Comments)    Reaction:  Swelling at injection site  . Vicodin [Hydrocodone-Acetaminophen] Itching and Rash    ROS Review of Systems  Constitutional: Positive for fatigue. Negative for chills and fever.  HENT: Negative for sore throat and trouble swallowing.   Eyes: Negative for photophobia and visual disturbance.  Respiratory: Negative for cough and shortness of breath.   Gastrointestinal: Negative for abdominal pain, blood in stool, constipation, diarrhea and nausea.       Reflux symptoms  Endocrine: Negative for polydipsia, polyphagia and polyuria.  Genitourinary: Negative for dysuria and frequency (occasional).  Musculoskeletal: Positive for arthralgias, back pain, gait problem, joint swelling and myalgias.  Skin: Negative for rash and wound.  Neurological: Negative for dizziness and headaches.  Hematological: Negative for adenopathy. Does not bruise/bleed easily.  Psychiatric/Behavioral: Positive for sleep disturbance. Negative for self-injury and suicidal ideas. The patient is nervous/anxious.       Objective:    Physical Exam  Constitutional: She is oriented to person, place, and time. She appears well-developed and well-nourished.  Obese older female in NAD sitting on exam chair wearing a mask as per office COVID protocol. Patient appears fatigued and has crutches leaning against a nearby wall  Neck: Neck supple. No JVD present.  Cardiovascular: Normal rate and regular rhythm.  Pulmonary/Chest: Effort normal and breath sounds normal.  Abdominal: Soft. There is no abdominal tenderness. There is no rebound and no guarding.  Truncal obesity  Musculoskeletal:        General: Tenderness and edema present.     Comments: Patient with puffiness at the ankles and some mild tenderness at the lateral and medial malleoli of the ankles; patient with lumbosacral tenderness to palp and bilateral thoracolumbar paraspinous spasm. Tenderness at  the DIP joints of the hands. Mild puffiness of the hands and left little finger with ulnar deviation. Mild edema at MTP joints at pointer and middle fingers  Lymphadenopathy:    She has no cervical adenopathy.  Neurological: She is alert and oriented to person, place, and time.  Skin: Skin is warm and dry.  Psychiatric:  Patient with flattened affect and tearfulness at times  Nursing note and vitals reviewed.  BP 125/81 (BP Location: Left Arm, Patient Position: Sitting, Cuff Size: Normal)   Pulse 73   Temp 98.8 F (37.1 C) (Oral)   Ht 5\' 11"  (1.803 m)   Wt 252 lb (114.3 kg)   LMP 06/29/2015 (Exact Date)   SpO2 97%   BMI 35.15 kg/m   Wt Readings from Last 3 Encounters:  09/16/19 252 lb (114.3 kg)  09/12/19 243 lb (110.2 kg)  08/30/19 243 lb (110.2 kg)     Health Maintenance Due  Topic Date Due  . FOOT EXAM  02/03/2019  . OPHTHALMOLOGY EXAM  02/03/2019  . INFLUENZA VACCINE  05/29/2019  Patient was offered but declined influenza immunization at today's visit  Lab Results  Component Value Date   TSH 1.21 09/11/2016   Lab Results  Component Value Date   WBC 6.6 08/30/2019   HGB 13.8 08/30/2019   HCT 43.0 08/30/2019   MCV 97.3 08/30/2019   PLT 396 08/30/2019   Lab Results  Component Value Date   NA 141 08/30/2019   K 2.9 (L) 08/30/2019   CO2 31 08/30/2019   GLUCOSE 115 (H) 08/30/2019   BUN 6 08/30/2019   CREATININE 0.50 08/30/2019   BILITOT 0.6 08/30/2019   ALKPHOS 77 08/30/2019   AST 21 08/30/2019   ALT 18 08/30/2019   PROT 9.1 (H) 08/30/2019   ALBUMIN 3.8 08/30/2019   CALCIUM 9.9 08/30/2019   ANIONGAP 12 08/30/2019   Lab Results  Component Value Date   CHOL 172 07/02/2019   Lab Results  Component Value Date   HDL 40 07/02/2019   Lab Results  Component Value Date   LDLCALC 79 07/02/2019   Lab Results  Component Value Date   TRIG 326 (H) 07/02/2019   Lab Results  Component Value Date   CHOLHDL 4.3 07/02/2019   Lab Results  Component  Value Date   HGBA1C 6.1 (A) 04/22/2019      Assessment & Plan:  1. Prediabetes Patient with Hgb A1c of 6.1 in June and will have repeat Hgb A1c at today's visit. Non-fasting glucose of 119 as she reports that she eats at 4 am so that she can then take her medications as she cannot take them on an empty stomach. She does report that she has recently started making dietary changes but has not been able to increase activity due to her recent issues with joint pain. - Glucose (CBG) - HgB A1c - Comprehensive metabolic panel  2. Essential hypertension Blood pressure is currently very well controlled on current amlodipine 10 mg and HCTZ.   3. Lumbar back pain with radiculopathy affecting right lower extremity; Encounter for examination following treatment at hospital She reports long standing issues with low back pain since a prior workplace injury and she has had multiple recent ED visits. Refill provided for tramadol and again discussed with patient that relafen and ibuprofen are in the same class of medication and clarified that she can take one medication or the other but not both as she is currently taking relafen in the morning and ibuprofen in the evening. Refill provided of tramadol to take as needed for pain.  - AMB referral to orthopedics  4. Chronic pain of multiple joints Patient with multiple joint pain and with her swelling in her hands and ankles, it is suspected that she may have an autoimmune/connective tissue disorder. Patient also had improvement in generalized joint pain with prednisone taper prescribed at the ED  Patient will have arthritis panel and CCP antibodies as well as referral to rheumatology for further evaluation.  - AMB referral to orthopedics - Arthritis Panel - CYCLIC CITRUL PEPTIDE ANTIBODY, IGG/IGA - Ambulatory referral to Rheumatology  5. Type 2 diabetes mellitus without complication, without long-term current use of insulin ([place]) Patient with Hgb A1c of 6.8  at today's visit consistent with diabetes. Glucophage is being increased to 500 mg twice per day and she is encouraged to continue her dietary changes.  - metFORMIN (GLUCOPHAGE) 500 MG tablet; Take 1 tablet (500 mg total) by mouth 2 (two) times daily with a meal.  Dispense: 90 tablet; Refill: 1 - Comprehensive metabolic panel  6. Hypokalemia Patient with potassium level of 2.9 at her ED visit on 08/30/2019 and this will be rechecked at today's visit and she will be notified if additional potassium supplementation needed  7. Gastroesophageal reflux disease without esophagitis She reports some reflux symptoms and has been taking NSAID's long term which can increase her risk for gastritis and ulcers. Will have patient increase omeprazole to twice per day. Rx was given for tramadol so that hopefully she can reduce NSAID use as she is also on daily aspirin.   8. Mixed hyperlipidemia Continue use of atorvastatin; LFT's in follow-up of long term use of medication - Comprehensive metabolic panel  9. Encounter for long-term (current) use of medications CMET in follow-up of long term use of multiple medications - Comprehensive metabolic panel  10. Vitamin D deficiency Will check Vitamin D level as this has been low in the past and may be contributing to her joint pain and fatigue - VITAMIN D 25 Hydroxy (Vit-D Deficiency, Fractures)  11. Encounter for examination following treatment at hospital Encounter notes from her recent ED visits were reviewed and discussed with patient at today's visit and labs/medications and referrals placed for follow-up of her back and joint pain.   12. anxiety and depression Continue the use of wellbutrin and patient was offered social work referral which she declined at this time  75.  long-term (current) use of non-steroidal anti-inflammatories CBC done 08/30/2019 and normal. No platelet abnormality or anemia. Omeprazole was increased to 40 mg twice per day for stomach  protection and treatment of GERD.   An After Visit Summary was printed and given to the patient.  Follow-up: Return in about 6 weeks (around 10/28/2019) for chronic issues.   Antony Blackbird, MD

## 2019-09-18 LAB — COMPREHENSIVE METABOLIC PANEL WITH GFR
ALT: 16 IU/L (ref 0–32)
AST: 14 IU/L (ref 0–40)
Albumin/Globulin Ratio: 1.1 — ABNORMAL LOW (ref 1.2–2.2)
Albumin: 4.1 g/dL (ref 3.8–4.9)
Alkaline Phosphatase: 108 IU/L (ref 39–117)
BUN/Creatinine Ratio: 9 (ref 9–23)
BUN: 6 mg/dL (ref 6–24)
Bilirubin Total: 0.2 mg/dL (ref 0.0–1.2)
CO2: 26 mmol/L (ref 20–29)
Calcium: 10 mg/dL (ref 8.7–10.2)
Chloride: 97 mmol/L (ref 96–106)
Creatinine, Ser: 0.69 mg/dL (ref 0.57–1.00)
GFR calc Af Amer: 114 mL/min/1.73
GFR calc non Af Amer: 99 mL/min/1.73
Globulin, Total: 3.9 g/dL (ref 1.5–4.5)
Glucose: 91 mg/dL (ref 65–99)
Potassium: 3.9 mmol/L (ref 3.5–5.2)
Sodium: 143 mmol/L (ref 134–144)
Total Protein: 8 g/dL (ref 6.0–8.5)

## 2019-09-18 LAB — VITAMIN D 25 HYDROXY (VIT D DEFICIENCY, FRACTURES): Vit D, 25-Hydroxy: 34.6 ng/mL (ref 30.0–100.0)

## 2019-09-18 LAB — CYCLIC CITRUL PEPTIDE ANTIBODY, IGG/IGA: Cyclic Citrullin Peptide Ab: 7 U (ref 0–19)

## 2019-09-18 LAB — ARTHRITIS PANEL
Anti Nuclear Antibody (ANA): NEGATIVE
Rheumatoid fact SerPl-aCnc: <10 [IU]/mL (ref 0.0–13.9)
Sed Rate: 48 mm/h — ABNORMAL HIGH (ref 0–40)
Uric Acid: 8.5 mg/dL — ABNORMAL HIGH (ref 2.5–7.1)

## 2019-09-25 ENCOUNTER — Encounter (HOSPITAL_COMMUNITY): Payer: Self-pay

## 2019-09-25 ENCOUNTER — Emergency Department (HOSPITAL_COMMUNITY)
Admission: EM | Admit: 2019-09-25 | Discharge: 2019-09-25 | Disposition: A | Discharge status: Home / Self Care | Payer: Self-pay | Attending: Emergency Medicine | Admitting: Emergency Medicine

## 2019-09-25 ENCOUNTER — Other Ambulatory Visit: Payer: Self-pay

## 2019-09-25 DIAGNOSIS — G8929 Other chronic pain: Secondary | ICD-10-CM | POA: Insufficient documentation

## 2019-09-25 DIAGNOSIS — M5441 Lumbago with sciatica, right side: Secondary | ICD-10-CM | POA: Insufficient documentation

## 2019-09-25 DIAGNOSIS — J45909 Unspecified asthma, uncomplicated: Secondary | ICD-10-CM | POA: Insufficient documentation

## 2019-09-25 DIAGNOSIS — Z79899 Other long term (current) drug therapy: Secondary | ICD-10-CM | POA: Insufficient documentation

## 2019-09-25 DIAGNOSIS — I10 Essential (primary) hypertension: Secondary | ICD-10-CM | POA: Insufficient documentation

## 2019-09-25 DIAGNOSIS — F17210 Nicotine dependence, cigarettes, uncomplicated: Secondary | ICD-10-CM | POA: Insufficient documentation

## 2019-09-25 DIAGNOSIS — E119 Type 2 diabetes mellitus without complications: Secondary | ICD-10-CM | POA: Insufficient documentation

## 2019-09-25 DIAGNOSIS — Z7984 Long term (current) use of oral hypoglycemic drugs: Secondary | ICD-10-CM | POA: Insufficient documentation

## 2019-09-25 DIAGNOSIS — Z7982 Long term (current) use of aspirin: Secondary | ICD-10-CM | POA: Insufficient documentation

## 2019-09-25 DIAGNOSIS — M5442 Lumbago with sciatica, left side: Secondary | ICD-10-CM | POA: Insufficient documentation

## 2019-09-25 MED ORDER — DIAZEPAM 2 MG PO TABS: 2.0000 mg | ORAL_TABLET | Freq: Four times a day (QID) | ORAL | 0 refills | Status: DC | PRN

## 2019-09-25 MED ORDER — DIAZEPAM 5 MG PO TABS
5.0000 mg | ORAL_TABLET | Freq: Once | ORAL | Status: AC
  Administered 2019-09-25: 5 mg via ORAL
  Filled 2019-09-25: qty 1

## 2019-09-25 MED ORDER — MORPHINE SULFATE (PF) 4 MG/ML IV SOLN
8.0000 mg | Freq: Once | INTRAVENOUS | Status: AC
  Administered 2019-09-25: 8 mg via INTRAMUSCULAR
  Filled 2019-09-25: qty 2

## 2019-09-25 MED ORDER — MORPHINE SULFATE (PF) 4 MG/ML IV SOLN: 8.0000 mg | Freq: Once | INTRAVENOUS | Status: DC

## 2019-09-25 NOTE — ED Triage Notes (Signed)
Patient c/o bilateral lower back pain since last night. Patient denies any radiation of pain to the legs. Patient denies any injury or heavy lifting.

## 2019-09-25 NOTE — ED Provider Notes (Signed)
[facility] DEPT Provider Note   CSN: [id number] Arrival date & time: 09/25/19  1007     History   Chief Complaint Chief Complaint  Patient presents with  . Back Pain    HPI Sydney Williams is a 53 y.o. female.     53 year old female presents with worsening chronic back pain localized to the right side.  Denies any new history of trauma.  No bowel or bladder dysfunction.  No distal numbness or tingling to her feet.  Pain is characterized as sharp and worse with any movement.  Denies any urinary symptoms.  Has used tramadol at home without relief.     Past Medical History:  Diagnosis Date  . Asthma    2015 hospitaliation for asthma  . Diabetes mellitus    vs impaired fasting glucose  . Family history of breast cancer   . Fibroids 2010  . Genital herpes   . GERD (gastroesophageal reflux disease)   . Hyperlipidemia   . Hypertension   . Impaired fasting blood sugar   . Migraines   . Obesity   . Smoker     Patient Active Problem List   Diagnosis Date Noted  . Family history of cervical cancer 02/02/2018  . Family history of breast cancer 02/02/2018  . Vitamin D deficiency 02/02/2018  . Need for hepatitis C screening test 02/02/2018  . Low back pain 09/29/2017  . Abnormal myocardial perfusion study   . Depression 06/19/2017  . Hypokalemia 06/19/2017  . Hypomagnesemia 06/19/2017  . Vaccine counseling 01/08/2017  . 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 Screening for cervical cancer 01/08/2017  . Vision exam with abnormal findings 01/08/2017  . Snoring 09/11/2016  . History of migraine 09/11/2016  . Frequent headaches 09/11/2016  . Muscle cramp 09/11/2016  . Encounter for health maintenance examination in adult 12/04/2015  . Heel pain, bilateral 12/04/2015  . Screening for breast cancer 12/04/2015  . Need for influenza vaccination 12/04/2015  . Moderate persistent asthma 06/01/2015  . Gastroesophageal reflux disease  without esophagitis 06/01/2015  . Rhinitis, allergic 06/01/2015  . Genital herpes 11/30/2014  . Smoker 11/30/2014  . Essential hypertension 11/30/2014  . Hyperlipidemia 11/30/2014  . Impaired fasting blood sugar 11/30/2014  . Obesity 07/02/2012    Past Surgical History:  Procedure Laterality Date  . COLONOSCOPY  03/13/2016   Dr. Carlean Purl, normal, repeat 2027  . CYST EXCISION     left neck/postauricular region, benign  . LAPAROSCOPIC ABDOMINAL EXPLORATION     removal of ectopic preg  . LEFT HEART CATH AND CORONARY ANGIOGRAPHY N/A 06/23/2017   Procedure: LEFT HEART CATH AND CORONARY ANGIOGRAPHY;  Surgeon: Nelva Bush, MD;  Location: [facility];  Service: Cardiovascular;  Laterality: N/A;  . TUBAL LIGATION       OB History    Gravida  5   Para  1   Term  1   Preterm      AB  2   Living  3     SAB  1   TAB      Ectopic  1   Multiple      Live Births               Home Medications    Prior to Admission medications   Medication Sig Start Date End Date Taking? Authorizing Provider  acyclovir (ZOVIRAX) 200 MG capsule Take 1 capsule (200 mg total) by mouth 2 (two) times daily. 08/04/19   Antony Blackbird, MD  albuterol (PROVENTIL HFA;VENTOLIN HFA) 108 (90 Base) MCG/ACT inhaler Inhale 1-2 puffs into the lungs every 6 (six) hours as needed for wheezing or shortness of breath. 02/03/18   Tysinger, Camelia Eng, PA-C  amLODipine (NORVASC) 10 MG tablet Take 1 tablet (10 mg total) by mouth daily. To lower blood pressure 06/03/19   Fulp, Cammie, MD  aspirin (EQ ASPIRIN ADULT LOW DOSE) 81 MG EC tablet Take 1 tablet (81 mg total) by mouth daily. Swallow whole. 02/03/18   Tysinger, Camelia Eng, PA-C  atorvastatin (LIPITOR) 40 MG tablet Take 1 tablet (40 mg total) by mouth daily. 07/02/19   Fulp, Cammie, MD  baclofen (LIORESAL) 10 MG tablet Take 1-2 tablets (10-20 mg total) by mouth 3 (three) times daily as needed for muscle spasms. 07/28/19   Hilts, Legrand Como, MD  buPROPion (WELLBUTRIN  XL) 150 MG 24 hr tablet TAKE 1 TABLET BY MOUTH ONCE DAILY Patient not taking: Reported on 07/09/2019 12/11/18   Tysinger, Camelia Eng, PA-C  EPINEPHrine (EPIPEN 2-PAK) 0.3 mg/0.3 mL IJ SOAJ injection Inject 0.3 mLs (0.3 mg total) into the muscle once as needed for up to 1 dose (for severe allergic reaction). CAll 911 immediately if you have to use this medicine 07/12/18   Baird Cancer, Vilinda Blanks, PA-C  Fluticasone-Salmeterol (ADVAIR) 250-50 MCG/DOSE AEPB Inhale 1 puff into the lungs 2 (two) times daily. 02/03/18   Tysinger, Camelia Eng, PA-C  furosemide (LASIX) 20 MG tablet One pill once per day if needed for swelling 08/04/19   Fulp, Cammie, MD  gabapentin (NEURONTIN) 300 MG capsule Take 300 mg by mouth at bedtime as needed (back pain).  08/20/18   [provider]  hydrochlorothiazide (HYDRODIURIL) 25 MG tablet Take 1 tablet (25 mg total) by mouth daily. To lower blood pressure 04/22/19   Fulp, Cammie, MD  ibuprofen (ADVIL) 600 MG tablet Take 1 tablet (600 mg total) by mouth every 8 (eight) hours as needed for moderate pain. Take after eating 08/04/19   Fulp, Cammie, MD  metFORMIN (GLUCOPHAGE) 500 MG tablet Take 1 tablet (500 mg total) by mouth 2 (two) times daily with a meal. 09/16/19   Fulp, Cammie, MD  metoprolol tartrate (LOPRESSOR) 50 MG tablet Take 1 tablet (50 mg total) by mouth 2 (two) times daily. 06/10/19   Fulp, Cammie, MD  nabumetone (RELAFEN) 750 MG tablet Take 1 tablet (750 mg total) by mouth 2 (two) times daily as needed. 07/28/19   Hilts, Legrand Como, MD  omeprazole (PRILOSEC) 40 MG capsule Take 1 capsule (40 mg total) by mouth 2 (two) times daily. To decrease stomach acid 09/16/19   Fulp, Cammie, MD  oxyCODONE-acetaminophen (PERCOCET/ROXICET) 5-325 MG tablet Take 1-2 tablets by mouth every 4 (four) hours as needed for severe pain. Patient not taking: Reported on 09/16/2019 08/30/19   Nuala Alpha A, PA-C  traMADol (ULTRAM) 50 MG tablet Take 1 tablet (50 mg total) by mouth 2 (two) times daily. As needed  for pain 09/16/19   Fulp, Cammie, MD  Vitamin D, Cholecalciferol, 25 MCG (1000 UT) TABS Take 1 tablet by mouth once daily 02/18/19   Tysinger, Camelia Eng, PA-C    Family History Family History  Problem Relation Age of Onset  . Diabetes Mother   . Hypertension Mother   . Aneurysm Mother   . Stroke Mother   . Cancer Mother        cervical cancer  . Cancer Maternal Aunt        breast  . Cancer Cousin  breast/breast  . Heart disease Neg Hx   . Colon cancer Neg Hx     Social History Social History   Tobacco Use  . Smoking status: Current Every Day Smoker    Packs/day: 0.25    Years: 14.00    Pack years: 3.50    Types: Cigarettes  . Smokeless tobacco: Never Used  Substance Use Topics  . Alcohol use: Yes    Comment: occ  . Drug use: No     Allergies   Lisinopril, Potassium chloride, Pneumococcal vaccines, and Vicodin [hydrocodone-acetaminophen]   Review of Systems Review of Systems  All other systems reviewed and are negative.    Physical Exam Updated Vital Signs BP (!) 137/97 (BP Location: Left Arm)   Pulse 73   Temp 98.2 F (36.8 C) (Oral)   Resp 16   Ht 1.803 m (5\' 11" )   Wt 114.8 kg   LMP 06/29/2015 (Exact Date)   SpO2 96%   BMI 35.29 kg/m   Physical Exam Vitals signs and nursing note reviewed.  Constitutional:      General: She is not in acute distress.    Appearance: Normal appearance. She is well-developed. She is not toxic-appearing.  HENT:     Head: Normocephalic and atraumatic.  Eyes:     General: Lids are normal.     Conjunctiva/sclera: Conjunctivae normal.     Pupils: Pupils are equal, round, and reactive to light.  Neck:     Musculoskeletal: Normal range of motion and neck supple.     Thyroid: No thyroid mass.     Trachea: No tracheal deviation.  Cardiovascular:     Rate and Rhythm: Normal rate and regular rhythm.     Heart sounds: Normal heart sounds. No murmur. No gallop.   Pulmonary:     Effort: Pulmonary effort is normal. No  respiratory distress.     Breath sounds: Normal breath sounds. No stridor. No decreased breath sounds, wheezing, rhonchi or rales.  Abdominal:     General: Bowel sounds are normal. There is no distension.     Palpations: Abdomen is soft.     Tenderness: There is no abdominal tenderness. There is no rebound.  Musculoskeletal: Normal range of motion.        General: No tenderness.       Back:  Skin:    General: Skin is warm and dry.     Findings: No abrasion or rash.  Neurological:     Mental Status: She is alert and oriented to person, place, and time.     GCS: GCS eye subscore is 4. GCS verbal subscore is 5. GCS motor subscore is 6.     Cranial Nerves: No cranial nerve deficit.     Sensory: No sensory deficit.     Deep Tendon Reflexes:     Reflex Scores:      Patellar reflexes are 2+ on the right side and 2+ on the left side.    Comments: Strength is 5 of 5 in lower extremities  Psychiatric:        Speech: Speech normal.        Behavior: Behavior normal.      ED Treatments / Results  Labs (all labs ordered are listed, but only abnormal results are displayed) Labs Reviewed - No data to display  EKG None  Radiology No results found.  Procedures Procedures (including critical care time)  Medications Ordered in ED Medications  diazepam (VALIUM) tablet 5 mg (has no administration in  time range)  morphine 4 MG/ML injection 8 mg (has no administration in time range)     Initial Impression / Assessment and Plan / ED Course  I have reviewed the triage vital signs and the nursing notes.  Pertinent labs & imaging results that were available during my care of the patient were reviewed by me and considered in my medical decision making (see chart for details).        Patient medicated for pain here and feels better.  Neurological signs stable.  Will discharge  Final Clinical Impressions(s) / ED Diagnoses   Final diagnoses:  None    ED Discharge Orders    None        Lacretia Leigh, MD 09/25/19 1209

## 2019-09-29 ENCOUNTER — Other Ambulatory Visit: Payer: Self-pay | Admitting: Family Medicine

## 2019-09-29 MED FILL — ?AMLODIPINE BESYLATE 10 MG: 10 | 30 days supply | Qty: 30 | Fill #4

## 2019-09-29 MED FILL — ?IBUPROFEN 600 MG TABS: 600 | 17 days supply | Qty: 53 | Fill #1

## 2019-09-29 MED FILL — ?HYDROCHLOROTHIAZIDE 25MG T: 25 | 30 days supply | Qty: 30 | Fill #4

## 2019-09-29 MED FILL — ?BACLOFEN 10 MG TABLET: 10 | 15 days supply | Qty: 90 | Fill #0

## 2019-09-29 MED FILL — ?ATORVASTATIN 40MG TABLET: 40 | 30 days supply | Qty: 30 | Fill #0

## 2019-09-29 MED FILL — ACYCLOVIR 200 MG CAPSULE: 200 | 30 days supply | Qty: 60 | Fill #1

## 2019-10-01 ENCOUNTER — Other Ambulatory Visit: Payer: Self-pay

## 2019-10-01 ENCOUNTER — Encounter: Payer: Self-pay | Admitting: Family Medicine

## 2019-10-01 ENCOUNTER — Other Ambulatory Visit: Payer: Self-pay | Admitting: Family Medicine

## 2019-10-01 ENCOUNTER — Ambulatory Visit (INDEPENDENT_AMBULATORY_CARE_PROVIDER_SITE_OTHER): Payer: Self-pay | Admitting: Family Medicine

## 2019-10-01 DIAGNOSIS — E790 Hyperuricemia without signs of inflammatory arthritis and tophaceous disease: Secondary | ICD-10-CM

## 2019-10-01 DIAGNOSIS — G8929 Other chronic pain: Secondary | ICD-10-CM

## 2019-10-01 DIAGNOSIS — M5441 Lumbago with sciatica, right side: Secondary | ICD-10-CM

## 2019-10-01 DIAGNOSIS — M5442 Lumbago with sciatica, left side: Secondary | ICD-10-CM

## 2019-10-01 MED ORDER — ALLOPURINOL 100 MG PO TABS: 100.0000 mg | ORAL_TABLET | Freq: Every day | ORAL | 6 refills | Status: DC

## 2019-10-01 MED ORDER — MEPERIDINE HCL 50 MG PO TABS: 50.0000 mg | ORAL_TABLET | Freq: Every evening | ORAL | 0 refills | Status: DC | PRN

## 2019-10-01 MED ORDER — PREGABALIN 75 MG PO CAPS: ORAL_CAPSULE | ORAL | 3 refills | Status: DC

## 2019-10-01 NOTE — Progress Notes (Signed)
Patient ID: Sydney Williams, female   DOB: November 16, 1965, 53 y.o.   MRN: [id number]   Elevated uric acid level on recent blood work and patient has complaint of multiple joint pain.  We will send in prescription for allopurinol and have patient return for visit/lab work in approximately 4 to 6 weeks to recheck uric acid level and electrolytes

## 2019-10-01 NOTE — Progress Notes (Signed)
Office Visit Note   Patient: Sydney Williams           Date of Birth: 09/13/1966           MRN: [id number] Visit Date: 10/01/2019 Requested by: Antony Blackbird, MD [place] [id number] PCP: Antony Blackbird, MD  Subjective: Chief Complaint  Patient presents with  . Lower Back - Pain    Been on crutches since 07/10/19. Started with swelling in feet and legs. Worsening low back pain - spasms in the back  - "only thing I can do is stand in place."    HPI: She is here for follow-up chronic back pain.  Since last visit she never heard from anybody regarding MRI scheduling.  She has had continued flareups of pain and has been to the ER a couple times.  She is having to walk with crutches because her back "locks up" when she gets into certain positions.  Pain radiates into both legs.  She is currently on baclofen and tramadol with no improvement.  In the past she has tried physical therapy and has had multiple epidural injections.  At one point she was scheduled for surgery but it was denied by [employer].              ROS:   All other systems were reviewed and are negative.  Objective: Vital Signs: LMP 06/29/2015 (Exact Date)   Physical Exam:  General:  Alert and oriented, in no acute distress. Pulm:  Breathing unlabored. Psy:  Normal mood, congruent affect.  Low back: She is very tender to palpation in the midline over L5-S1 and also near both SI joints.  Negative bilateral straight leg raise, lower extremity strength and reflexes are still normal and equal.  Imaging: None today  Assessment & Plan: 1.  Chronic low back pain -We will order an MRI scan again and call her with the results.  Surgical consult if indicated. -Trial of Lyrica.  10 tablets of Demerol given only for severe pain.     Procedures: No procedures performed  No notes on file     PMFS History: Patient Active Problem List   Diagnosis Date Noted  . Family history of cervical cancer  02/02/2018  . Family history of breast cancer 02/02/2018  . Vitamin D deficiency 02/02/2018  . Need for hepatitis C screening test 02/02/2018  . Low back pain 09/29/2017  . Abnormal myocardial perfusion study   . Depression 06/19/2017  . Hypokalemia 06/19/2017  . Hypomagnesemia 06/19/2017  . Vaccine counseling 01/08/2017  . 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 Screening for cervical cancer 01/08/2017  . Vision exam with abnormal findings 01/08/2017  . Snoring 09/11/2016  . History of migraine 09/11/2016  . Frequent headaches 09/11/2016  . Muscle cramp 09/11/2016  . Encounter for health maintenance examination in adult 12/04/2015  . Heel pain, bilateral 12/04/2015  . Screening for breast cancer 12/04/2015  . Need for influenza vaccination 12/04/2015  . Moderate persistent asthma 06/01/2015  . Gastroesophageal reflux disease without esophagitis 06/01/2015  . Rhinitis, allergic 06/01/2015  . Genital herpes 11/30/2014  . Smoker 11/30/2014  . Essential hypertension 11/30/2014  . Hyperlipidemia 11/30/2014  . Impaired fasting blood sugar 11/30/2014  . Obesity 07/02/2012   Past Medical History:  Diagnosis Date  . Asthma    2015 hospitaliation for asthma  . Diabetes mellitus    vs impaired fasting glucose  . Family history of breast cancer   . Fibroids 2010  .  Genital herpes   . GERD (gastroesophageal reflux disease)   . Hyperlipidemia   . Hypertension   . Impaired fasting blood sugar   . Migraines   . Obesity   . Smoker     Family History  Problem Relation Age of Onset  . Diabetes Mother   . Hypertension Mother   . Aneurysm Mother   . Stroke Mother   . Cancer Mother        cervical cancer  . Cancer Maternal Aunt        breast  . Cancer Cousin        breast/breast  . Heart disease Neg Hx   . Colon cancer Neg Hx     Past Surgical History:  Procedure Laterality Date  . COLONOSCOPY  03/13/2016   Dr. Carlean Purl, normal, repeat 2027  . CYST EXCISION      left neck/postauricular region, benign  . LAPAROSCOPIC ABDOMINAL EXPLORATION     removal of ectopic preg  . LEFT HEART CATH AND CORONARY ANGIOGRAPHY N/A 06/23/2017   Procedure: LEFT HEART CATH AND CORONARY ANGIOGRAPHY;  Surgeon: Nelva Bush, MD;  Location: [facility];  Service: Cardiovascular;  Laterality: N/A;  . TUBAL LIGATION     Social History   Occupational History  . Not on file  Tobacco Use  . Smoking status: Current Every Day Smoker    Packs/day: 0.25    Years: 14.00    Pack years: 3.50    Types: Cigarettes  . Smokeless tobacco: Never Used  Substance and Sexual Activity  . Alcohol use: Yes    Comment: occ  . Drug use: No  . Sexual activity: Yes    Birth control/protection: Surgical

## 2019-10-04 ENCOUNTER — Telehealth: Payer: Self-pay | Admitting: Family Medicine

## 2019-10-04 MED FILL — ?ALLOPURINOL 100MG TABLET: 100 | 30 days supply | Qty: 30 | Fill #0

## 2019-10-04 NOTE — Telephone Encounter (Signed)
Patient calling in regards to test results.  Please contact patient at [phone]

## 2019-10-11 ENCOUNTER — Other Ambulatory Visit: Payer: Self-pay | Admitting: Family Medicine

## 2019-10-11 DIAGNOSIS — I10 Essential (primary) hypertension: Secondary | ICD-10-CM

## 2019-10-11 MED FILL — ?METOPROLOL 50 MG TABLET: 50 | 30 days supply | Qty: 60 | Fill #0

## 2019-10-11 NOTE — Telephone Encounter (Signed)
Patient received results from 11/19 visit. Patient has future orders from 12/4 that have not been completed as of yet.

## 2019-10-15 MED FILL — ?IBUPROFEN 600 MG TABLETS: 600 | 2 days supply | Qty: 7 | Fill #2

## 2019-10-15 MED FILL — NABUMETONE 750 MG TABS: 750 | 30 days supply | Qty: 60 | Fill #2

## 2019-10-20 ENCOUNTER — Ambulatory Visit (HOSPITAL_COMMUNITY)
Admission: RE | Admit: 2019-10-20 | Discharge: 2019-10-20 | Disposition: A | Discharge status: Home / Self Care | Payer: Self-pay | Source: Ambulatory Visit | Attending: Family Medicine | Admitting: Family Medicine

## 2019-10-20 ENCOUNTER — Other Ambulatory Visit: Payer: Self-pay

## 2019-10-20 ENCOUNTER — Telehealth: Payer: Self-pay | Admitting: Family Medicine

## 2019-10-20 DIAGNOSIS — G8929 Other chronic pain: Secondary | ICD-10-CM | POA: Insufficient documentation

## 2019-10-20 DIAGNOSIS — M5441 Lumbago with sciatica, right side: Secondary | ICD-10-CM | POA: Insufficient documentation

## 2019-10-20 DIAGNOSIS — M5442 Lumbago with sciatica, left side: Secondary | ICD-10-CM | POA: Insufficient documentation

## 2019-10-20 NOTE — Telephone Encounter (Signed)
Lumbar MRI shows:  Disc bulges at multiple levels with worsening protrusion at L3-4 (compared to 2011 MRI).  The disc touches the right L3 nerve.    The other levels have spinal canals that are very narrowed by the disc bulges and bone spurs.  Would recommend consultation with Dr. Louanne Skye to discuss surgical options.

## 2019-10-25 ENCOUNTER — Other Ambulatory Visit: Payer: Self-pay

## 2019-10-25 DIAGNOSIS — G8929 Other chronic pain: Secondary | ICD-10-CM

## 2019-10-25 DIAGNOSIS — M5442 Lumbago with sciatica, left side: Secondary | ICD-10-CM

## 2019-10-25 NOTE — Telephone Encounter (Signed)
I called and advised the patient of her MRI results. She voiced understanding and would like to proceed with appointment with Dr. Louanne Skye. Referral placed.

## 2019-11-01 ENCOUNTER — Ambulatory Visit: Payer: Self-pay | Admitting: Family Medicine

## 2019-11-01 MED FILL — ?ATORVASTATIN 40MG TABLET: 40 | 30 days supply | Qty: 30 | Fill #1

## 2019-11-01 MED FILL — traMADol HCL 50 MG TABS: 50 | 30 days supply | Qty: 60 | Fill #1

## 2019-11-01 MED FILL — ?AMLODIPINE BESYLATE 10 MG: 10 | 30 days supply | Qty: 30 | Fill #5

## 2019-11-01 MED FILL — ?ALLOPURINOL 100MG TABLET: 100 | 30 days supply | Qty: 30 | Fill #1

## 2019-11-01 MED FILL — ?BACLOFEN 10 MG TABLET: 10 | 15 days supply | Qty: 90 | Fill #1

## 2019-11-01 MED FILL — ?HYDROCHLOROTHIAZIDE 25MG T: 25 | 30 days supply | Qty: 30 | Fill #5

## 2019-11-10 ENCOUNTER — Other Ambulatory Visit: Payer: Self-pay

## 2019-11-10 ENCOUNTER — Encounter: Payer: Self-pay | Admitting: Family Medicine

## 2019-11-10 ENCOUNTER — Ambulatory Visit: Payer: Self-pay | Attending: Family Medicine | Admitting: Family Medicine

## 2019-11-10 DIAGNOSIS — M5416 Radiculopathy, lumbar region: Secondary | ICD-10-CM

## 2019-11-10 DIAGNOSIS — F329 Major depressive disorder, single episode, unspecified: Secondary | ICD-10-CM

## 2019-11-10 DIAGNOSIS — Z789 Other specified health status: Secondary | ICD-10-CM

## 2019-11-10 DIAGNOSIS — Z7409 Other reduced mobility: Secondary | ICD-10-CM

## 2019-11-10 MED ORDER — GABAPENTIN 100 MG PO CAPS: ORAL_CAPSULE | ORAL | 3 refills | Status: DC

## 2019-11-10 MED FILL — GABAPENTIN 100 MG CAP: 100 | 30 days supply | Qty: 120 | Fill #0

## 2019-11-10 MED FILL — NABUMETONE 750 MG TABS: 750 | 30 days supply | Qty: 60 | Fill #3

## 2019-11-10 MED FILL — ACYCLOVIR 200 MG CAPSULE: 200 | 30 days supply | Qty: 60 | Fill #2

## 2019-11-10 MED FILL — ?METFORMIN HCL 500MG TABLET: 500 | 30 days supply | Qty: 60 | Fill #1

## 2019-11-10 MED FILL — OMEPRAZOLE DR 40 MG CAPSULE: 40 | 30 days supply | Qty: 60 | Fill #1

## 2019-11-10 MED FILL — ?METOPROLOL 50 MG TABLET: 50 | 30 days supply | Qty: 60 | Fill #1

## 2019-11-10 NOTE — Progress Notes (Signed)
Virtual Visit via Telephone Note  I connected with Sydney Williams on 11/10/19 at  8:30 AM EST by telephone and verified that I am speaking with the correct person using two identifiers.   I discussed the limitations, risks, security and privacy concerns of performing an evaluation and management service by telephone and the availability of in person appointments. I also discussed with the patient that there may be a patient responsible charge related to this service. The patient expressed understanding and agreed to proceed.  Patient Location: Home Provider Location: [facility] Others participating in call: None   History of Present Illness:          54 year old female seen in follow-up of chronic low back pain with radiation.  She reports that she has seen orthopedics since her last visit and has had her MRI.  She has follow-up appointment with orthopedics on December 12, 2019.  She states that she was provided with crutches to use to help with mobility.  She states that she cannot work currently secondary to the chronic pain and having to use crutches.  She is concerned about her finances.  She also feels as if she is starting to have some depression/anxiety regarding her chronic pain as well as what she will do financially since she is unable to work.  She reports that her back pain is about a 9 on a 0-to-10 scale with 0 being no pain and 10 being the worst pain imaginable.  Pain is in her lower back and now radiates down both legs.  Pain is sharp and sometimes with a burning sensation.  She was prescribed Lyrica by orthopedics but was unable to obtain the medication due to cost but has placed an application for patient assistance program.  She was previously on gabapentin 300 mg at bedtime however this medication caused her to have bad dreams and she stopped the medication.  She would be willing to restart the medication at a lower dose.  She denies any current issues with fever or chills, no chest  pain or palpitations, no shortness of breath or cough, occasional headaches secondary to stress, no dizziness.  She does have sensation sometimes that her legs will give away and cause her to fall.  She denies any bowel or bladder dysfunction.  No urinary frequency, dysuria or incontinence.  No abdominal pain-no nausea/vomiting/diarrhea or constipation.  She is taking tramadol which slightly decreases the pain and occasionally takes ibuprofen but states that orthopedics told her not to mix orthopedics with some of her other medications.   Past Medical History:  Diagnosis Date  . Asthma    2015 hospitaliation for asthma  . Diabetes mellitus    vs impaired fasting glucose  . Family history of breast cancer   . Fibroids 2010  . Genital herpes   . GERD (gastroesophageal reflux disease)   . Hyperlipidemia   . Hypertension   . Impaired fasting blood sugar   . Migraines   . Obesity   . Smoker     Past Surgical History:  Procedure Laterality Date  . COLONOSCOPY  03/13/2016   Dr. Carlean Purl, normal, repeat 2027  . CYST EXCISION     left neck/postauricular region, benign  . LAPAROSCOPIC ABDOMINAL EXPLORATION     removal of ectopic preg  . LEFT HEART CATH AND CORONARY ANGIOGRAPHY N/A 06/23/2017   Procedure: LEFT HEART CATH AND CORONARY ANGIOGRAPHY;  Surgeon: Nelva Bush, MD;  Location: [facility];  Service: Cardiovascular;  Laterality: N/A;  .  TUBAL LIGATION      Family History  Problem Relation Age of Onset  . Diabetes Mother   . Hypertension Mother   . Aneurysm Mother   . Stroke Mother   . Cancer Mother        cervical cancer  . Cancer Maternal Aunt        breast  . Cancer Cousin        breast/breast  . Heart disease Neg Hx   . Colon cancer Neg Hx     Social History   Tobacco Use  . Smoking status: Current Every Day Smoker    Packs/day: 0.25    Years: 14.00    Pack years: 3.50    Types: Cigarettes  . Smokeless tobacco: Never Used  Substance Use Topics  .  Alcohol use: Yes    Comment: occ  . Drug use: No     Allergies  Allergen Reactions  . Lisinopril Swelling  . Potassium Chloride Shortness Of Breath and Swelling    Tolerates IV KCl, reaction only to PO product  . Pneumococcal Vaccines Swelling and Other (See Comments)    Reaction:  Swelling at injection site  . Vicodin [Hydrocodone-Acetaminophen] Itching and Rash       Observations/Objective: No vital signs or physical exam conducted as visit was done via telephone  Assessment and Plan: 1. Lumbar radiculopathy, chronic; 2. Impaired mobility and activities of daily living; 3. Reactive depression (situational) Keep upcoming appointment with orthopedics for further evaluation and treatment of lumbar radiculopathy with chronic low back pain with radiation.  Discussed with patient that gabapentin can help with the nerve type pain/radiating pain and she is willing to restart at a lower dose as the 300 mg dose in the past caused her to have nightmares.  Prescription provided for gabapentin 100 mg and she is aware that she should start initially with nighttime dose for 1 week and then may advance to taking in the daytime as well if she tolerates the nighttime dose.  She will be referred to social work for counseling and to help set up counseling if needed for her reactive depression.  She declines medication or referral for mental health services.  Unfortunately due to her issues with her chronic back pain, she is unable to work which is causing a financial strain and additionally she has difficulty performing everyday activities such as bathing, self-care and meal preparation due to her difficulty standing and balance issues from back pain and sensation that her legs will give away.  She may be a candidate for vocational rehabilitation especially if she may require surgical intervention and this can also be discussed with patient by social work.  Vocational rehab was mentioned to the patient at today's  visit as well. - Ambulatory referral to Social Work - gabapentin (NEURONTIN) 100 MG capsule; One pill in morning, one pill in late afternoon and 1-2 at bedtime  Dispense: 120 capsule; Refill: 3    Follow Up Instructions:Return in about 5 weeks (around 12/15/2019) for Back pain/chronic issues, sooner if needed.    I discussed the assessment and treatment plan with the patient. The patient was provided an opportunity to ask questions and all were answered. The patient agreed with the plan and demonstrated an understanding of the instructions.   The patient was advised to call back or seek an in-person evaluation if the symptoms worsen or if the condition fails to improve as anticipated.  I provided 10 minutes of non-face-to-face time during this encounter.  Antony Blackbird, MD

## 2019-11-10 NOTE — Progress Notes (Signed)
Pt states her pain is also coming from b/l legs

## 2019-11-25 ENCOUNTER — Telehealth: Payer: Self-pay | Admitting: Licensed Clinical Social Worker

## 2019-11-25 NOTE — Telephone Encounter (Signed)
Call placed to patient regarding [facility] referral. LCSW introduced self and explained role at [facility]. Appointment scheduled for 11/29/19

## 2019-11-29 ENCOUNTER — Other Ambulatory Visit: Payer: Self-pay

## 2019-11-29 ENCOUNTER — Ambulatory Visit: Payer: Self-pay | Attending: Family Medicine | Admitting: Licensed Clinical Social Worker

## 2019-11-29 ENCOUNTER — Other Ambulatory Visit: Payer: Self-pay | Admitting: Family Medicine

## 2019-11-29 DIAGNOSIS — F329 Major depressive disorder, single episode, unspecified: Secondary | ICD-10-CM

## 2019-11-29 DIAGNOSIS — I10 Essential (primary) hypertension: Secondary | ICD-10-CM

## 2019-11-29 MED FILL — ?HYDROCHLOROTHIAZIDE 25MG T: 25 | 30 days supply | Qty: 30 | Fill #6

## 2019-11-29 MED FILL — ?ALLOPURINOL 100MG TABLET: 100 | 30 days supply | Qty: 30 | Fill #2

## 2019-11-29 MED FILL — ?ATORVASTATIN 40MG TABL: 40 | 30 days supply | Qty: 30 | Fill #2

## 2019-11-29 NOTE — BH Specialist Note (Signed)
Integrated Behavioral Health Visit via Telemedicine (Telephone)  11/29/2019 Charna Busman [id number]   Session Start time: 11:05 AM  Session End time: 11:25 AM Total time: 20  Referring Provider: Dr. Chapman Fitch Type of Visit: Telephonic Patient location: Home [facility] Provider location: Office All persons participating in visit: LCSW and Patient  Confirmed patient's address: Yes  Confirmed patient's phone number: Yes  Any changes to demographics: No   Confirmed patient's insurance: Yes  Any changes to patient's insurance: No   Discussed confidentiality: Yes    The following statements were read to the patient and/or legal guardian that are established with the [facility] Provider.  "The purpose of this phone visit is to provide behavioral health care while limiting exposure to the coronavirus (COVID19).  There is a possibility of technology failure and discussed alternative modes of communication if that failure occurs."  "By engaging in this telephone visit, you consent to the provision of healthcare.  Additionally, you authorize for your insurance to be billed for the services provided during this telephone visit."   Patient and/or legal guardian consented to telephone visit: Yes   PRESENTING CONCERNS: Patient and/or family reports the following symptoms/concerns: Pt reports increase in depression and anxiety symptoms triggered by difficulty managing medical conditions and financial strain.  Duration of problem: 2018; Severity of problem: moderate  STRENGTHS (Protective Factors/Coping Skills): Pt receives strong support from family and friends Pt has desire to change  GOALS ADDRESSED: Patient will: 1.  Reduce symptoms of: anxiety, depression and stress  2.  Increase knowledge and/or ability of: coping skills and healthy habits  3.  Demonstrate ability to: Increase healthy adjustment to current life circumstances and Increase adequate support systems for  patient/family  INTERVENTIONS: Interventions utilized:  Solution-Focused Strategies, Supportive Counseling and Psychoeducation and/or Health Education Standardized Assessments completed: Not Needed  ASSESSMENT: Patient currently experiencing increase in depression and anxiety symptoms triggered by difficulty managing medical conditions and financial strain. Pt was injured at work in 2018 resulting in chronic pain and limited mobility that prevents pt from returning to work.   Patient may benefit from brief therapy and medication mangement. LCSW discussed correlation between one's physical and mental health, in addition to how stress can negatively impact both. Healthy coping skills were identified and patient was strongly encouraged to apply for CAFA to assist with medical costs. LCSW mailed application to address on file, per pt request.   PLAN: 1. Follow up with behavioral health clinician on : Follow up with LCSW for additional behavioral health and/or resource needs 2. Behavioral recommendations: Complete CAFA application and schedule appointment with Financial Counselor and continue utilizing healthy coping skills 3. Referral(s): [place] [facility])  Rebekah [place], [place] 11/29/2019

## 2019-12-02 MED FILL — traMADol HCL 50 MG TABS: 50 | 30 days supply | Qty: 60 | Fill #0

## 2019-12-02 MED FILL — AMLODIPINE BESYLATE 10 MG T: 10 | 30 days supply | Qty: 30 | Fill #0

## 2019-12-08 ENCOUNTER — Other Ambulatory Visit: Payer: Self-pay

## 2019-12-08 ENCOUNTER — Ambulatory Visit: Payer: Self-pay

## 2019-12-08 ENCOUNTER — Ambulatory Visit (INDEPENDENT_AMBULATORY_CARE_PROVIDER_SITE_OTHER): Payer: Self-pay | Admitting: Specialist

## 2019-12-08 ENCOUNTER — Encounter: Payer: Self-pay | Admitting: Specialist

## 2019-12-08 VITALS — BP 125/87 | HR 73 | Ht 71.0 in | Wt 243.0 lb

## 2019-12-08 DIAGNOSIS — M48062 Spinal stenosis, lumbar region with neurogenic claudication: Secondary | ICD-10-CM

## 2019-12-08 DIAGNOSIS — M5442 Lumbago with sciatica, left side: Secondary | ICD-10-CM

## 2019-12-08 DIAGNOSIS — M5136 Other intervertebral disc degeneration, lumbar region: Secondary | ICD-10-CM

## 2019-12-08 DIAGNOSIS — M5441 Lumbago with sciatica, right side: Secondary | ICD-10-CM

## 2019-12-08 DIAGNOSIS — G8929 Other chronic pain: Secondary | ICD-10-CM

## 2019-12-08 DIAGNOSIS — M47816 Spondylosis without myelopathy or radiculopathy, lumbar region: Secondary | ICD-10-CM

## 2019-12-08 NOTE — Progress Notes (Signed)
Office Visit Note   Patient: [place]           Date of Birth: November 02, 1965           MRN: [id number] Visit Date: 12/08/2019              Requested by: Eunice Blase, MD [address] [place],  [place] [address] PCP: Antony Blackbird, MD   Assessment & Plan: Visit Diagnoses:  1. Chronic low back pain with bilateral sciatica, unspecified back pain laterality   2. DDD (degenerative disc disease), lumbar   3. Spinal stenosis of lumbar region with neurogenic claudication   4. Spondylosis of lumbar spine     Plan: Avoid bending, stooping and avoid lifting weights greater than 10 lbs. Avoid prolong standing and walking. Order for a new walker with wheels. Surgery scheduling secretary Kandice Hams, will call you in the next week to schedule for surgery.  Surgery recommended is a two level lumbar fusion L2-3, L3-4 and L4-5 this would be done with rods, screws and cages with local bone graft and allograft (donor bone graft). Take hydrocodone for for pain. Risk of surgery includes risk of infection 1 in 100 patients, bleeding 1/2% chance you would need a transfusion.   Risk to the nerves is one in 10,000. You will need to use a brace for 3 months and wean from the brace on the 4th month. Expect improved walking and standing tolerance. Expect relief of leg pain but numbness may persist depending on the length and degree of pressure that has been present. 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  Follow-Up Instructions: No follow-ups on file.   Orders:  Orders Placed This Encounter    Procedures  . XR Lumbar Spine 2-3 Views   No orders of the defined types were placed in this encounter.     Procedures: No procedures performed   Clinical Data: Findings:  CLINICAL DATA: Low back pain and left leg pain. Bilateral lower extremity weakness and swelling.  EXAM: MRI LUMBAR SPINE WITHOUT CONTRAST  TECHNIQUE: Multiplanar, multisequence MR imaging of the lumbar spine was performed. No intravenous contrast was administered.  COMPARISON: Radiographs dated 05/18/2019 and lumbar MRI dated 09/27/2010  FINDINGS: Segmentation: Standard.  Alignment: Physiologic.  Vertebrae: No fracture, evidence of discitis, or bone lesion. Patchy low signal from the bone marrow, not as prominent as on the prior study. The patient is a smoker.  Conus medullaris and cauda equina: Conus extends to the L1-2 level. Conus and cauda equina appear normal.  Paraspinal and other soft tissues: Negative.  Disc levels:  T12-L1 and L1-2: Normal discs. Hypertrophy of the facet joints bilaterally at each level without significant progression.  L2-3: Disc desiccation with a small broad-based disc bulge and central annular tear, increased since the prior study. Increased hypertrophy of the facet joints and ligamentum flavum without neural impingement or significant spinal stenosis.  L3-4: Progressive disc degeneration with progressive disc space narrowing. Small broad-based disc bulge with accompanying osteophytes extending into both neural foramina with a new right far lateral disc protrusion which touches the  right L3 nerve lateral to the neural foramen but does not appear to compress it. Moderate right and mild left facet arthritis, essentially unchanged. No focal neural impingement. Small amount of fluid in the right side of the disc space felt to be degenerative.  L4-5: Chronic marked disc space narrowing with extensive degenerative changes of the vertebral endplates,  minimally progressed. Broad-based disc bulge with accompanying osteophytes extending into both neural foramina with symmetrical compression of the thecal sac and lateral recesses with chronic hypertrophy of the ligamentum flavum and facet joints, essentially unchanged. The L4 nerves appear to exit without impingement. Mild spinal stenosis at L4-5, unchanged.  L5-S1: Chronic disc desiccation with a tiny broad-based disc bulge with no neural impingement. Moderate bilateral facet arthritis. No neural impingement. No significant change since the prior study.  IMPRESSION: 1. Degenerative disc disease at L2-3 through L5-S1 without focal neural impingement. 2. Chronic degenerative disc disease at L4-5 with symmetrical compression of the thecal sac and lateral recesses without focal neural impingement. 3. Multilevel moderate facet arthritis without significant change since the prior study. 4. New right far lateral disc protrusion at L3-4 which touches the right L3 nerve lateral to the neural foramen but does not appear to compress it.   Electronically Signed By: Lorriane Shire M.D. On: 10/20/2019 16:20    Subjective: Chief Complaint  Patient presents with  . 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 She was lifting a patient while working at [employer] here in [place]. She has worked at [employer] for 8 year previous to that. She was previously working for [employer] and [employer] and had an injury to her back and that improved with steroid injections by Dr. Nelva Bush of [facility]. With the injury in 2018 she was seen by Dr. Nelva Bush and had MRI and had repeated the ESIs. Unfortunately the ESIs did not seem to help with the pain and she was seen by Dr. Rennis Harding for a second opinion. Dr. Patrice Paradise told her she had discs that were the source of  the pain and recommended surgery. She settled concerning her back and eventually returned to work in July or August of 2020. She relates that in September the pain in the back and legs worsened with working with her clients and helping with their care. She  Noticed onset of leg swelling and feet swelling and had tests for blood clots in the legs at  [facility] and these were negative. She was seen by her primary care at [facility] and was placed on  Bilateral crutches for walking purposed. The crutches help her to stand upright and see also uses back braces from previous treatement by  Dr. Rennis Harding in 2019. No bowel or bladder difficulty. Pain is a 10 on a scale of 1-10. She has to sit on a commode and wash and uses a sink and does hygiene. She has pain with bending or lifting. Spends much of her time in the bed. Has a recliner and has to stand or  Stretch out after sitting awhile. She does not ride in car due to pain with prolong sitting. She has difficulty reaching her shoes and socks and she had her daughter assist her. She has trouble walking from the bedroom to the bathroom with her cane not more than 25 feet.  She has less pain with lying down and she awakens every 1-1.5 hours and she has to pull with her arms to turn herself over. Presently she is taking gabapentin since last month and tramadol since Sept 2020. That's all they will give her. She has taken stronger gabapentin and  It gave her hallucinations so she is on alower dose now. Presently also taking nabumetone 750 mg BID, and has a history of diabetes and hypertension, and history of asthma.  No previous back surgery. She stopped work in Sept 2020 and has been using crutches and  Unable to return to work.  Also is taking baclofen. She smoked since 54 year old and stopped for a whole year and then started back she  Smokes 4 cigarettes per day.    Review of Systems  Constitutional: Negative.  Negative for activity  change, appetite change, chills, diaphoresis, fatigue, fever and unexpected weight change.  HENT: Negative.  Negative for congestion, dental problem, drooling, ear discharge, ear pain, facial swelling, hearing loss, mouth sores, nosebleeds, postnasal drip, rhinorrhea, sinus pressure, sinus pain, sneezing, sore throat, tinnitus, trouble swallowing and voice change.   Eyes: Negative.  Negative for visual disturbance.  Respiratory: Positive for wheezing. Negative for apnea, cough, choking, chest tightness, shortness of breath and stridor.   Cardiovascular: Positive for leg swelling. Negative for chest pain and palpitations.  Gastrointestinal: Negative.  Negative for abdominal distention, abdominal pain, anal bleeding, blood in stool, constipation, diarrhea, nausea, rectal pain and vomiting.  Endocrine: Negative.  Negative for cold intolerance and heat intolerance.  Genitourinary: Negative.  Negative for difficulty urinating, dyspareunia, dysuria, enuresis, flank pain, frequency and urgency.  Musculoskeletal: Positive for back pain, gait problem and joint swelling.  Skin: Negative.  Negative for color change, pallor, rash and wound.  Allergic/Immunologic: Negative.  Negative for environmental allergies, food allergies and immunocompromised state.  Neurological: Positive for dizziness, weakness, light-headedness and numbness. Negative for tremors, seizures, syncope, facial asymmetry, speech difficulty and headaches.  Hematological: Negative.  Negative for adenopathy. Does not bruise/bleed easily.  Psychiatric/Behavioral: Negative.  Negative for agitation, behavioral problems, confusion, decreased concentration, dysphoric mood, hallucinations, self-injury, sleep disturbance and suicidal ideas. The patient is not nervous/anxious and is not hyperactive.      Objective: Vital Signs: BP 125/87 (BP Location: Left Arm, Patient Position: Sitting)   Pulse 73   Ht 5\' 11"  (1.803 m)   Wt 243 lb (110.2 kg)   LMP  06/29/2015 (Exact Date)   BMI 33.89 kg/m   Physical Exam Constitutional:      Appearance: She is well-developed.  HENT:     Head: Normocephalic and atraumatic.  Eyes:     Pupils: Pupils are equal, round, and reactive to light.  Pulmonary:     Effort: Pulmonary effort is normal.     Breath sounds: Normal breath sounds.  Abdominal:     General: Bowel sounds are normal.     Palpations: Abdomen is soft.  Musculoskeletal:     Cervical back: Normal range of motion and neck supple.     Lumbar back: Negative right straight leg raise test and negative left straight leg raise test.  Skin:    General: Skin is warm and dry.  Neurological:     Mental Status: She is alert and oriented to person, place, and time.  Psychiatric:        Behavior: Behavior normal.        Thought Content: Thought content normal.        Judgment: Judgment  normal.     Back Exam   Tenderness  The patient is experiencing tenderness in the lumbar.  Range of Motion  Extension: abnormal  Flexion: abnormal  Lateral bend right: abnormal  Lateral bend left: abnormal  Rotation right: abnormal  Rotation left: abnormal   Muscle Strength  Right Quadriceps:  5/5  Left Quadriceps:  5/5  Right Hamstrings:  5/5  Left Hamstrings:  5/5   Tests  Straight leg raise right: negative Straight leg raise left: negative  Reflexes  Patellar: 2/4 Achilles: 2/4 Babinski's sign: normal       Specialty Comments:  No specialty comments available.  Imaging: No results found.   PMFS History: Patient Active Problem List   Diagnosis Date Noted  . Family history of cervical cancer 02/02/2018  . Family history of breast cancer 02/02/2018  . Vitamin D deficiency 02/02/2018  . Need for hepatitis C screening test 02/02/2018  . Low back pain 09/29/2017  . Abnormal myocardial perfusion study   . Depression 06/19/2017  . Hypokalemia 06/19/2017  . Hypomagnesemia 06/19/2017  . Vaccine counseling 01/08/2017  .  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 Screening for cervical cancer 01/08/2017  . Vision exam with abnormal findings 01/08/2017  . Snoring 09/11/2016  . History of migraine 09/11/2016  . Frequent headaches 09/11/2016  . Muscle cramp 09/11/2016  . Encounter for health maintenance examination in adult 12/04/2015  . Heel pain, bilateral 12/04/2015  . Screening for breast cancer 12/04/2015  . Need for influenza vaccination 12/04/2015  . Moderate persistent asthma 06/01/2015  . Gastroesophageal reflux disease without esophagitis 06/01/2015  . Rhinitis, allergic 06/01/2015  . Genital herpes 11/30/2014  . Smoker 11/30/2014  . Essential hypertension 11/30/2014  . Hyperlipidemia 11/30/2014  . Impaired fasting blood sugar 11/30/2014  . Obesity 07/02/2012   Past Medical History:  Diagnosis Date  . Asthma    2015 hospitaliation for asthma  . Diabetes mellitus    vs impaired fasting glucose  . Family history of breast cancer   . Fibroids 2010  . Genital herpes   . GERD (gastroesophageal reflux disease)   . Hyperlipidemia   . Hypertension   . Impaired fasting blood sugar   . Migraines   . Obesity   . Smoker     Family History  Problem Relation Age of Onset  . Diabetes Mother   . Hypertension Mother   . Aneurysm Mother   . Stroke Mother   . Cancer Mother        cervical cancer  . Cancer Maternal Aunt        breast  . Cancer Cousin        breast/breast  . Heart disease Neg Hx   . Colon cancer Neg Hx     Past Surgical History:  Procedure Laterality Date  . COLONOSCOPY  03/13/2016   Dr. Carlean Purl, normal, repeat 2027  . CYST EXCISION     left neck/postauricular region, benign  . LAPAROSCOPIC ABDOMINAL EXPLORATION     removal of ectopic preg  . LEFT HEART CATH AND CORONARY ANGIOGRAPHY N/A 06/23/2017   Procedure: LEFT HEART CATH AND CORONARY ANGIOGRAPHY;  Surgeon: Nelva Bush, MD;  Location: [facility];  Service: Cardiovascular;  Laterality: N/A;    . TUBAL LIGATION     Social History   Occupational History  . Not on file  Tobacco Use  . Smoking status: Current Every Day Smoker    Packs/day: 0.25    Years: 14.00    Pack  years: 3.50    Types: Cigarettes  . Smokeless tobacco: Never Used  Substance and Sexual Activity  . Alcohol use: Yes    Comment: occ  . Drug use: No  . Sexual activity: Yes    Birth control/protection: Surgical

## 2019-12-08 NOTE — Patient Instructions (Signed)
Avoid bending, stooping and avoid lifting weights greater than 10 lbs. Avoid prolong standing and walking. Order for a new walker with wheels. Surgery scheduling secretary Kandice Hams, will call you in the next week to schedule for surgery.  Surgery recommended is a two level lumbar fusion L2-3, L3-4 and L4-5 this would be done with rods, screws and cages with local bone graft and allograft (donor bone graft). Take hydrocodone for for pain. Risk of surgery includes risk of infection 1 in 100 patients, bleeding 1/2% chance you would need a transfusion.   Risk to the nerves is one in 10,000. You will need to use a brace for 3 months and wean from the brace on the 4th month. Expect improved walking and standing tolerance. Expect relief of leg pain but numbness may persist depending on the length and degree of pressure that has been present. 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

## 2019-12-10 ENCOUNTER — Other Ambulatory Visit: Payer: Self-pay | Admitting: Family Medicine

## 2019-12-10 MED FILL — ?METOPROLOL 50 MG TABLET: 50 | 30 days supply | Qty: 60 | Fill #2

## 2019-12-10 MED FILL — NABUMETONE 750 MG TABS: 750 | 30 days supply | Qty: 60 | Fill #4

## 2019-12-13 MED FILL — BACLOFEN 10 MG TABLET: 10 | 15 days supply | Qty: 90 | Fill #0

## 2019-12-16 ENCOUNTER — Other Ambulatory Visit: Payer: Self-pay

## 2019-12-16 ENCOUNTER — Ambulatory Visit: Payer: Medicaid Other | Attending: Family Medicine | Admitting: Family Medicine

## 2019-12-16 ENCOUNTER — Encounter: Payer: Self-pay | Admitting: Family Medicine

## 2019-12-16 DIAGNOSIS — Z91018 Allergy to other foods: Secondary | ICD-10-CM

## 2019-12-16 DIAGNOSIS — Z598 Other problems related to housing and economic circumstances: Secondary | ICD-10-CM

## 2019-12-16 DIAGNOSIS — Z599 Problem related to housing and economic circumstances, unspecified: Secondary | ICD-10-CM

## 2019-12-16 DIAGNOSIS — Z789 Other specified health status: Secondary | ICD-10-CM

## 2019-12-16 DIAGNOSIS — F329 Major depressive disorder, single episode, unspecified: Secondary | ICD-10-CM

## 2019-12-16 DIAGNOSIS — R600 Localized edema: Secondary | ICD-10-CM

## 2019-12-16 DIAGNOSIS — M5416 Radiculopathy, lumbar region: Secondary | ICD-10-CM

## 2019-12-16 MED ORDER — FUROSEMIDE 20 MG PO TABS: ORAL_TABLET | ORAL | 0 refills | Status: DC

## 2019-12-16 MED ORDER — EPINEPHRINE 0.3 MG/0.3ML IJ SOAJ: 0.3000 mg | Freq: Once | INTRAMUSCULAR | 11 refills | Status: DC | PRN

## 2019-12-16 MED ORDER — GABAPENTIN 100 MG PO CAPS: ORAL_CAPSULE | ORAL | 3 refills | Status: DC

## 2019-12-16 MED ORDER — OXYCODONE HCL 5 MG PO CAPS: 5.0000 mg | ORAL_CAPSULE | Freq: Two times a day (BID) | ORAL | 0 refills | Status: DC | PRN

## 2019-12-16 MED FILL — ?FUROSEMIDE 20 MG TABLET: 20 | 20 days supply | Qty: 20 | Fill #0

## 2019-12-16 MED FILL — GABAPENTIN 100 MG CAPSULE: 100 | 30 days supply | Qty: 180 | Fill #0

## 2019-12-16 NOTE — Progress Notes (Signed)
Patient verified DOB Patient has eaten today. Patient has taken medication today. Patient complains of lower back pain described as sharp and throbbing. Patient complains of bilateral ankle swelling the past 3 days. Refill Epi PEN, refill on lasix. Patient has no income at this time which has increased her anxiety

## 2019-12-16 NOTE — Progress Notes (Signed)
Virtual Visit via Telephone Note  I connected with Sydney Williams on 12/16/19 at  9:50 AM EST by telephone and verified that I am speaking with the correct person using two identifiers.   I discussed the limitations, risks, security and privacy concerns of performing an evaluation and management service by telephone and the availability of in person appointments. I also discussed with the patient that there may be a patient responsible charge related to this service. The patient expressed understanding and agreed to proceed.  Patient Location: Home Provider Location: [facility] Others participating in call: none   History of Present Illness:      54 year old female with chronic lumbar radiculopathy who reports recent increase in low back pain as well as 3 days of increased lower extremity edema.  She reports her current pain is about a 9 on a 0-to-10 scale with 10 being the worst imaginable pain.  She reports that she continues to have to use her crutches to help with ambulation.  She has been told that she needs surgery to help with her back pain however she is currently unemployed due to her issues with back pain and is uninsured.  She reports that she is having increased anxiety related to financial difficulty.  She denies bladder or bowel dysfunction with her current increased back pain.  Upon questioning, gabapentin has helped to decrease the sharp/burning sensation.  She currently takes the medicine twice daily-in the morning and at bedtime.  She is only taking 100 mg twice daily.  She can tolerate the 100 mg at bedtime without having bad dreams but she had previously stopped a higher dose of the gabapentin as the higher dose caused her to have vivid, bad dreams.  She does not have any issues with the daytime dose of her gabapentin.  She continues to take her baclofen 10 mg, 3 times daily.  She is currently taking the previously prescribed tramadol as well as taking ibuprofen but these medications  give minimal relief of her pain.  She also has pain in her upper arms due to the use of crutches.  She is very fatigued and frustrated about her chronic pain.  She denies suicidal thoughts or ideations.       She is requesting refill of Lasix to take as needed for peripheral edema.  She reports that she cannot take oral potassium pills as they cause her to have a rash.  She also has allergic reactions to lettuce.  She would like a refill of an EpiPen.  She denies any current issues with shortness of breath, cough, sore throat, difficulty swallowing/throat swelling.  She also reports allergy to Vicodin but is able to take oxycodone without problems.  Past Medical History:  Diagnosis Date  . Asthma    2015 hospitaliation for asthma  . Diabetes mellitus    vs impaired fasting glucose  . Family history of breast cancer   . Fibroids 2010  . Genital herpes   . GERD (gastroesophageal reflux disease)   . Hyperlipidemia   . Hypertension   . Impaired fasting blood sugar   . Migraines   . Obesity   . Smoker     Past Surgical History:  Procedure Laterality Date  . COLONOSCOPY  03/13/2016   Dr. Carlean Purl, normal, repeat 2027  . CYST EXCISION     left neck/postauricular region, benign  . LAPAROSCOPIC ABDOMINAL EXPLORATION     removal of ectopic preg  . LEFT HEART CATH AND CORONARY ANGIOGRAPHY N/A 06/23/2017  Procedure: LEFT HEART CATH AND CORONARY ANGIOGRAPHY;  Surgeon: Nelva Bush, MD;  Location: [facility];  Service: Cardiovascular;  Laterality: N/A;  . TUBAL LIGATION      Family History  Problem Relation Age of Onset  . Diabetes Mother   . Hypertension Mother   . Aneurysm Mother   . Stroke Mother   . Cancer Mother        cervical cancer  . Cancer Maternal Aunt        breast  . Cancer Cousin        breast/breast  . Heart disease Neg Hx   . Colon cancer Neg Hx     Social History   Tobacco Use  . Smoking status: Current Every Day Smoker    Packs/day: 0.25    Years:  14.00    Pack years: 3.50    Types: Cigarettes  . Smokeless tobacco: Never Used  Substance Use Topics  . Alcohol use: Yes    Comment: occ  . Drug use: No     Allergies  Allergen Reactions  . Lisinopril Swelling  . Potassium Chloride Shortness Of Breath and Swelling    Tolerates IV KCl, reaction only to PO product  . Pneumococcal Vaccines Swelling and Other (See Comments)    Reaction:  Swelling at injection site  . Vicodin [Hydrocodone-Acetaminophen] Itching and Rash       Observations/Objective: No vital signs or physical exam conducted as visit was done via telephone  Assessment and Plan: 1. Lumbar radiculopathy, chronic Patient with chronic lumbar radiculopathy for which she has been told that she will need surgery to help with pain relief.  Referral will be placed to medical social work to help patient with completion of paperwork for [facility] financial assistance program and/or vocational rehabilitation as patient has not yet completed these applications which may be because of her anxiety or depression.  Since she is having no difficulty tolerating her morning dose of gabapentin, new prescription is being sent in for her to take 2 pills in the morning, 2 in the late afternoon and 1 or 2 at bedtime.  Previous prescription was written for 1 in the morning, 1 in the late afternoon and 1 at bedtime but patient has only been taking the medication twice per day.  She reports that she is getting minimal pain relief from current Tylenol and tramadol therefore we will also send in a short supply of oxycodone 5 mg #30 that she may take up to twice daily as needed for severe pain. - gabapentin (NEURONTIN) 100 MG capsule; 2 in morning, 2 in late afternoon and 1-2 at bedtime  Dispense: 180 capsule; Refill: 3  2. Mild peripheral edema She reports that she has had onset of mild peripheral edema.  This may be related to her decreased mobility and/or venous insufficiency.  New prescription provided for  Lasix and as patient is unable to take oral potassium, she eat potassium rich foods as discussed such as banana/oranges/orange juice while on Lasix. - furosemide (LASIX) 20 MG tablet; One pill once per day if needed for swelling  Dispense: 20 tablet; Refill: 0  3. Food allergy She reports allergy to food and per chart also has shortness of breath/swelling with lisinopril which she is not taking.  She also reports that she cannot tolerate oral potassium or pneumococcal vaccines.  Refill provided of EpiPen to use for severe allergic reaction/anaphylaxis. - EPINEPHrine (EPIPEN 2-PAK) 0.3 mg/0.3 mL IJ SOAJ injection; Inject 0.3 mLs (0.3 mg  total) into the muscle once as needed for up to 1 dose (for severe allergic reaction). CAll 911 immediately if you have to use this medicine  Dispense: 1 each; Refill: 11  4. Reactive depression (situational) 5. Need for follow-up by social worker 6. Financial difficulty Patient with complaint of increased anxiety/depression related to her chronic pain as well as now being out of work and having anxiety over finances.  Patient has not yet completed her paperwork to apply for the financial assistance program or vocational rehab.  She has prior contact with social worker from this office and new referral is being placed for continued follow-up. -Ambulatory social work referral   Follow Up Instructions: Follow-up next week if no improvement in pain, otherwise 2 to 3-week follow-up    I discussed the assessment and treatment plan with the patient. The patient was provided an opportunity to ask questions and all were answered. The patient agreed with the plan and demonstrated an understanding of the instructions.   The patient was advised to call back or seek an in-person evaluation if the symptoms worsen or if the condition fails to improve as anticipated.  I provided 12 minutes of non-face-to-face time during this encounter.   Antony Blackbird, MD

## 2019-12-20 ENCOUNTER — Telehealth: Payer: Self-pay | Admitting: Family Medicine

## 2019-12-20 ENCOUNTER — Other Ambulatory Visit: Payer: Self-pay | Admitting: Family Medicine

## 2019-12-20 DIAGNOSIS — M5416 Radiculopathy, lumbar region: Secondary | ICD-10-CM

## 2019-12-20 MED ORDER — OXYCODONE-ACETAMINOPHEN 5-325 MG PO TABS: 1.0000 | ORAL_TABLET | Freq: Two times a day (BID) | ORAL | 0 refills | Status: DC | PRN

## 2019-12-20 NOTE — Progress Notes (Signed)
Patient ID: Sydney Williams, female   DOB: 19-Sep-1966, 54 y.o.   MRN: [id number]   Rx was sent to pharmacy last week for patient for pain medication however the RX was written for capsules and pharmacy only has pills/tablets therefore RX resent

## 2019-12-20 NOTE — Telephone Encounter (Signed)
Rx was resent 

## 2019-12-20 NOTE — Telephone Encounter (Signed)
1) Medication: -oxycodone (OXY-IR) 5 MG capsule   2) Pharmacy of Choice: -[facility], [address]   3) Special Requests: Pharmacy cannot fill capsules. Pt states it needs to be resent as pills or tablets. Please follow up as soon as possible

## 2019-12-21 ENCOUNTER — Telehealth: Payer: Self-pay | Admitting: Licensed Clinical Social Worker

## 2019-12-21 NOTE — Telephone Encounter (Signed)
Follow up call placed to patient. Patient shared that she recently completed a visit with PCP and was prescribed pain medication. Pt filled medication yesterday and reports no concerns as of yet.   Pt received [employer] application in the mail and plans to work on obtaining supportive documentation this week. LCSW strongly encouraged pt to contact her with any questions or concerns. Pt verbalized understanding and was appreciative for the follow up call.   No additional concerns noted.

## 2019-12-31 ENCOUNTER — Other Ambulatory Visit: Payer: Self-pay | Admitting: Family Medicine

## 2019-12-31 MED FILL — ?ALLOPURINOL 100MG TABLET: 100 | 30 days supply | Qty: 30 | Fill #3

## 2019-12-31 MED FILL — ACYCLOVIR 200 MG CAPSULE: 200 | 30 days supply | Qty: 60 | Fill #3

## 2019-12-31 MED FILL — ?HYDROCHLOROTHIAZIDE 25MG T: 25 | 30 days supply | Qty: 30 | Fill #7

## 2019-12-31 MED FILL — ?ATORVASTATIN 40MG TABL: 40 | 30 days supply | Qty: 30 | Fill #0

## 2019-12-31 MED FILL — AMLODIPINE BESYLATE 10 MG T: 10 | 30 days supply | Qty: 30 | Fill #1

## 2020-01-09 ENCOUNTER — Encounter (HOSPITAL_COMMUNITY): Payer: Self-pay | Admitting: Emergency Medicine

## 2020-01-09 ENCOUNTER — Emergency Department (HOSPITAL_COMMUNITY)
Admission: EM | Admit: 2020-01-09 | Discharge: 2020-01-09 | Disposition: A | Discharge status: Home / Self Care | Payer: Medicaid Other | Attending: Emergency Medicine | Admitting: Emergency Medicine

## 2020-01-09 ENCOUNTER — Other Ambulatory Visit: Payer: Self-pay

## 2020-01-09 DIAGNOSIS — H6981 Other specified disorders of Eustachian tube, right ear: Secondary | ICD-10-CM | POA: Insufficient documentation

## 2020-01-09 DIAGNOSIS — J302 Other seasonal allergic rhinitis: Secondary | ICD-10-CM | POA: Insufficient documentation

## 2020-01-09 DIAGNOSIS — J069 Acute upper respiratory infection, unspecified: Secondary | ICD-10-CM | POA: Insufficient documentation

## 2020-01-09 DIAGNOSIS — J45909 Unspecified asthma, uncomplicated: Secondary | ICD-10-CM | POA: Insufficient documentation

## 2020-01-09 DIAGNOSIS — F17210 Nicotine dependence, cigarettes, uncomplicated: Secondary | ICD-10-CM | POA: Insufficient documentation

## 2020-01-09 DIAGNOSIS — E119 Type 2 diabetes mellitus without complications: Secondary | ICD-10-CM | POA: Insufficient documentation

## 2020-01-09 DIAGNOSIS — Z7984 Long term (current) use of oral hypoglycemic drugs: Secondary | ICD-10-CM | POA: Insufficient documentation

## 2020-01-09 DIAGNOSIS — Z79899 Other long term (current) drug therapy: Secondary | ICD-10-CM | POA: Insufficient documentation

## 2020-01-09 DIAGNOSIS — Z20822 Contact with and (suspected) exposure to covid-19: Secondary | ICD-10-CM | POA: Insufficient documentation

## 2020-01-09 DIAGNOSIS — I10 Essential (primary) hypertension: Secondary | ICD-10-CM | POA: Insufficient documentation

## 2020-01-09 MED ORDER — DIPHENHYDRAMINE HCL 25 MG PO TABS: 25.0000 mg | ORAL_TABLET | Freq: Four times a day (QID) | ORAL | 0 refills | Status: DC | PRN

## 2020-01-09 MED ORDER — FLUTICASONE PROPIONATE 50 MCG/ACT NA SUSP: 2.0000 | Freq: Every day | NASAL | 1 refills | Status: DC

## 2020-01-09 NOTE — Discharge Instructions (Addendum)
Please take Flonase as prescribed.  Please use Benadryl as needed for congestion and allergy symptoms.  When drinking plenty of fluids.  There is a possibility that he may have a upper respiratory tract infection or Covid.   Your COVID test is pending; you should expect results in 2-3 days. You can access your results on your MyChart--if you test positive you should receive a phone call.  In the meantime follow CDC guidelines and quarantine, wear a mask, wash hands often.   Please take over the counter vitamin D 2000-4000 units per day. I also recommend zinc 50 mg per day for the next two weeks.   Please return to ED if you feel have difficulty breathing or have emergent, new or concerning symptoms.  Patients who have symptoms consistent with COVID-19 should self isolated for: At least 3 days (72 hours) have passed since recovery, defined as resolution of fever without the use of fever reducing medications and improvement in respiratory symptoms (e.g., cough, shortness of breath), and At least 7 days have passed since symptoms first appeared.       Person Under Monitoring Name: Sydney Williams  Location: [place] [address]   Infection Prevention Recommendations for Individuals Confirmed to have, or Being Evaluated for, 2019 Novel Coronavirus (COVID-19) Infection Who Receive Care at Home  Individuals who are confirmed to have, or are being evaluated for, COVID-19 should follow the prevention steps below until a healthcare provider or local or state health department says they can return to normal activities.  Stay home except to get medical care You should restrict activities outside your home, except for getting medical care. Do not go to work, school, or public areas, and do not use public transportation or taxis.  Call ahead before visiting your doctor Before your medical appointment, call the healthcare provider and tell them that you have, or are being  evaluated for, COVID-19 infection. This will help the healthcare provider's office take steps to keep other people from getting infected. Ask your healthcare provider to call the local or state health department.  Monitor your symptoms Seek prompt medical attention if your illness is worsening (e.g., difficulty breathing). Before going to your medical appointment, call the healthcare provider and tell them that you have, or are being evaluated for, COVID-19 infection. Ask your healthcare provider to call the local or state health department.  Wear a facemask You should wear a facemask that covers your nose and mouth when you are in the same room with other people and when you visit a healthcare provider. People who live with or visit you should also wear a facemask while they are in the same room with you.  Separate yourself from other people in your home As much as possible, you should stay in a different room from other people in your home. Also, you should use a separate bathroom, if available.  Avoid sharing household items You should not share dishes, drinking glasses, cups, eating utensils, towels, bedding, or other items with other people in your home. After using these items, you should wash them thoroughly with soap and water.  Cover your coughs and sneezes Cover your mouth and nose with a tissue when you cough or sneeze, or you can cough or sneeze into your sleeve. Throw used tissues in a lined trash can, and immediately wash your hands with soap and water for at least 20 seconds or use an alcohol-based hand rub.  Wash your Proofreader  your hands often and thoroughly with soap and water for at least 20 seconds. You can use an alcohol-based hand sanitizer if soap and water are not available and if your hands are not visibly dirty. Avoid touching your eyes, nose, and mouth with unwashed hands.   Prevention Steps for Caregivers and Household Members of Individuals Confirmed to  have, or Being Evaluated for, COVID-19 Infection Being Cared for in the Home  If you live with, or provide care at home for, a person confirmed to have, or being evaluated for, COVID-19 infection please follow these guidelines to prevent infection:  Follow healthcare provider's instructions Make sure that you understand and can help the patient follow any healthcare provider instructions for all care.  Provide for the patient's basic needs You should help the patient with basic needs in the home and provide support for getting groceries, prescriptions, and other personal needs.  Monitor the patient's symptoms If they are getting sicker, call his or her medical provider and tell them that the patient has, or is being evaluated for, COVID-19 infection. This will help the healthcare provider's office take steps to keep other people from getting infected. Ask the healthcare provider to call the local or state health department.  Limit the number of people who have contact with the patient If possible, have only one caregiver for the patient. Other household members should stay in another home or place of residence. If this is not possible, they should stay in another room, or be separated from the patient as much as possible. Use a separate bathroom, if available. Restrict visitors who do not have an essential need to be in the home.  Keep older adults, very young children, and other sick people away from the patient Keep older adults, very young children, and those who have compromised immune systems or chronic health conditions away from the patient. This includes people with chronic heart, lung, or kidney conditions, diabetes, and cancer.  Ensure good ventilation Make sure that shared spaces in the home have good air flow, such as from an air conditioner or an opened window, weather permitting.  Wash your hands often Wash your hands often and thoroughly with soap and water for at least  20 seconds. You can use an alcohol based hand sanitizer if soap and water are not available and if your hands are not visibly dirty. Avoid touching your eyes, nose, and mouth with unwashed hands. Use disposable paper towels to dry your hands. If not available, use dedicated cloth towels and replace them when they become wet.  Wear a facemask and gloves Wear a disposable facemask at all times in the room and gloves when you touch or have contact with the patient's blood, body fluids, and/or secretions or excretions, such as sweat, saliva, sputum, nasal mucus, vomit, urine, or feces.  Ensure the mask fits over your nose and mouth tightly, and do not touch it during use. Throw out disposable facemasks and gloves after using them. Do not reuse. Wash your hands immediately after removing your facemask and gloves. If your personal clothing becomes contaminated, carefully remove clothing and launder. Wash your hands after handling contaminated clothing. Place all used disposable facemasks, gloves, and other waste in a lined container before disposing them with other household waste. Remove gloves and wash your hands immediately after handling these items.  Do not share dishes, glasses, or other household items with the patient Avoid sharing household items. You should not share dishes, drinking glasses, cups, eating utensils,  towels, bedding, or other items with a patient who is confirmed to have, or being evaluated for, COVID-19 infection. After the person uses these items, you should wash them thoroughly with soap and water.  Wash laundry thoroughly Immediately remove and wash clothes or bedding that have blood, body fluids, and/or secretions or excretions, such as sweat, saliva, sputum, nasal mucus, vomit, urine, or feces, on them. Wear gloves when handling laundry from the patient. Read and follow directions on labels of laundry or clothing items and detergent. In general, wash and dry with the  warmest temperatures recommended on the label.  Clean all areas the individual has used often Clean all touchable surfaces, such as counters, tabletops, doorknobs, bathroom fixtures, toilets, phones, keyboards, tablets, and bedside tables, every day. Also, clean any surfaces that may have blood, body fluids, and/or secretions or excretions on them. Wear gloves when cleaning surfaces the patient has come in contact with. Use a diluted bleach solution (e.g., dilute bleach with 1 part bleach and 10 parts water) or a household disinfectant with a label that says EPA-registered for coronaviruses. To make a bleach solution at home, add 1 tablespoon of bleach to 1 quart (4 cups) of water. For a larger supply, add  cup of bleach to 1 gallon (16 cups) of water. Read labels of cleaning products and follow recommendations provided on product labels. Labels contain instructions for safe and effective use of the cleaning product including precautions you should take when applying the product, such as wearing gloves or eye protection and making sure you have good ventilation during use of the product. Remove gloves and wash hands immediately after cleaning.  Monitor yourself for signs and symptoms of illness Caregivers and household members are considered close contacts, should monitor their health, and will be asked to limit movement outside of the home to the extent possible. Follow the monitoring steps for close contacts listed on the symptom monitoring form.   ? If you have additional questions, contact your local health department or call the epidemiologist on call at [phone] (available 24/7). ? This guidance is subject to change. For the most up-to-date guidance from [facility], please refer to their website: [email or web address].pl

## 2020-01-09 NOTE — ED Triage Notes (Signed)
Per patient, states right ear pain, sore throat and slight cough-states ear is draining-symptoms started 3 days ago

## 2020-01-09 NOTE — ED Provider Notes (Signed)
[facility] DEPT Provider Note   CSN: [id number] Arrival date & time: 01/09/20  1257     History Chief Complaint  Patient presents with  . Otalgia    Sydney Williams is a 54 y.o. female.  HPI  Patient is a 54 year old female with a history of allergic rhinitis, asthma, DM, smoker.  Patient presents today for 4 days of congestion, mild cough, some right ear pain, sore throat, postnasal drip.  Patient states that her symptoms have been consistent over the last 4 days and have not worsened over improved.  She states that she has taken some Tylenol but no other medications.  She states that she was on Flonase in the past was not taking medications such as Flonase or Benadryl over the past few days.  She denies any chest pain, shortness of breath, headache, lightheadedness, dizziness.  Denies any purulent sputum, states that her cough is dry.  Denies any weakness or numbness.  Contrary to triage note patient states that there is no drainage coming out of the ear but rather that she feels like she has sinus drainage.     Past Medical History:  Diagnosis Date  . Asthma    2015 hospitaliation for asthma  . Diabetes mellitus    vs impaired fasting glucose  . Family history of breast cancer   . Fibroids 2010  . Genital herpes   . GERD (gastroesophageal reflux disease)   . Hyperlipidemia   . Hypertension   . Impaired fasting blood sugar   . Migraines   . Obesity   . Smoker     Patient Active Problem List   Diagnosis Date Noted  . Family history of cervical cancer 02/02/2018  . Family history of breast cancer 02/02/2018  . Vitamin D deficiency 02/02/2018  . Need for hepatitis C screening test 02/02/2018  . Low back pain 09/29/2017  . Abnormal myocardial perfusion study   . Depression 06/19/2017  . Hypokalemia 06/19/2017  . Hypomagnesemia 06/19/2017  . Vaccine counseling 01/08/2017  . 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  Screening for cervical cancer 01/08/2017  . Vision exam with abnormal findings 01/08/2017  . Snoring 09/11/2016  . History of migraine 09/11/2016  . Frequent headaches 09/11/2016  . Muscle cramp 09/11/2016  . Encounter for health maintenance examination in adult 12/04/2015  . Heel pain, bilateral 12/04/2015  . Screening for breast cancer 12/04/2015  . Need for influenza vaccination 12/04/2015  . Moderate persistent asthma 06/01/2015  . Gastroesophageal reflux disease without esophagitis 06/01/2015  . Rhinitis, allergic 06/01/2015  . Genital herpes 11/30/2014  . Smoker 11/30/2014  . Essential hypertension 11/30/2014  . Hyperlipidemia 11/30/2014  . Impaired fasting blood sugar 11/30/2014  . Obesity 07/02/2012    Past Surgical History:  Procedure Laterality Date  . COLONOSCOPY  03/13/2016   Dr. Carlean Purl, normal, repeat 2027  . CYST EXCISION     left neck/postauricular region, benign  . LAPAROSCOPIC ABDOMINAL EXPLORATION     removal of ectopic preg  . LEFT HEART CATH AND CORONARY ANGIOGRAPHY N/A 06/23/2017   Procedure: LEFT HEART CATH AND CORONARY ANGIOGRAPHY;  Surgeon: Nelva Bush, MD;  Location: [facility];  Service: Cardiovascular;  Laterality: N/A;  . TUBAL LIGATION       OB History    Gravida  5   Para  1   Term  1   Preterm      AB  2   Living  3     SAB  1   TAB      Ectopic  1   Multiple      Live Births              Family History  Problem Relation Age of Onset  . Diabetes Mother   . Hypertension Mother   . Aneurysm Mother   . Stroke Mother   . Cancer Mother        cervical cancer  . Cancer Maternal Aunt        breast  . Cancer Cousin        breast/breast  . Heart disease Neg Hx   . Colon cancer Neg Hx     Social History   Tobacco Use  . Smoking status: Current Every Day Smoker    Packs/day: 0.25    Years: 14.00    Pack years: 3.50    Types: Cigarettes  . Smokeless tobacco: Never Used  Substance Use Topics  .  Alcohol use: Yes    Comment: occ  . Drug use: No    Home Medications Prior to Admission medications   Medication Sig Start Date End Date Taking? Authorizing Provider  acyclovir (ZOVIRAX) 200 MG capsule Take 1 capsule (200 mg total) by mouth 2 (two) times daily. 08/04/19   Fulp, Cammie, MD  albuterol (PROVENTIL HFA;VENTOLIN HFA) 108 (90 Base) MCG/ACT inhaler Inhale 1-2 puffs into the lungs every 6 (six) hours as needed for wheezing or shortness of breath. 02/03/18   Tysinger, Camelia Eng, PA-C  allopurinol (ZYLOPRIM) 100 MG tablet Take 1 tablet (100 mg total) by mouth daily. 10/01/19   Fulp, Cammie, MD  amLODipine (NORVASC) 10 MG tablet TAKE 1 TABLET (10 MG TOTAL) BY MOUTH DAILY. TO LOWER BLOOD PRESSURE 12/01/19   Fulp, Cammie, MD  aspirin (EQ ASPIRIN ADULT LOW DOSE) 81 MG EC tablet Take 1 tablet (81 mg total) by mouth daily. Swallow whole. 02/03/18   Tysinger, Camelia Eng, PA-C  atorvastatin (LIPITOR) 40 MG tablet TAKE 1 TABLET (40 MG TOTAL) BY MOUTH DAILY. 12/31/19   Fulp, Cammie, MD  baclofen (LIORESAL) 10 MG tablet TAKE 1-2 TABLETS (10-20 MG TOTAL) BY MOUTH 3 (THREE) TIMES DAILY AS NEEDED FOR MUSCLE SPASMS. 12/10/19   Hilts, Legrand Como, MD  buPROPion (WELLBUTRIN XL) 150 MG 24 hr tablet TAKE 1 TABLET BY MOUTH ONCE DAILY Patient not taking: Reported on 07/09/2019 12/11/18   Tysinger, Camelia Eng, PA-C  diazepam (VALIUM) 2 MG tablet Take 1 tablet (2 mg total) by mouth every 6 (six) hours as needed for muscle spasms. 09/25/19   Lacretia Leigh, MD  diphenhydrAMINE (BENADRYL) 25 MG tablet Take 1 tablet (25 mg total) by mouth every 6 (six) hours as needed. 01/09/20   Tedd Sias, PA  EPINEPHrine (EPIPEN 2-PAK) 0.3 mg/0.3 mL IJ SOAJ injection Inject 0.3 mLs (0.3 mg total) into the muscle once as needed for up to 1 dose (for severe allergic reaction). CAll 911 immediately if you have to use this medicine 12/16/19   Fulp, Cammie, MD  fluticasone (FLONASE) 50 MCG/ACT nasal spray Place 2 sprays into both nostrils daily. 01/09/20    Tedd Sias, PA  Fluticasone-Salmeterol (ADVAIR) 250-50 MCG/DOSE AEPB Inhale 1 puff into the lungs 2 (two) times daily. 02/03/18   Tysinger, Camelia Eng, PA-C  furosemide (LASIX) 20 MG tablet One pill once per day if needed for swelling 12/16/19   Fulp, Cammie, MD  gabapentin (NEURONTIN) 100 MG capsule 2 in morning, 2 in late afternoon and 1-2 at bedtime 12/16/19  Fulp, Cammie, MD  hydrochlorothiazide (HYDRODIURIL) 25 MG tablet Take 1 tablet (25 mg total) by mouth daily. To lower blood pressure 04/22/19   Fulp, Cammie, MD  ibuprofen (ADVIL) 600 MG tablet Take 1 tablet (600 mg total) by mouth every 8 (eight) hours as needed for moderate pain. Take after eating Patient not taking: Reported on 12/16/2019 08/04/19   Fulp, Ander Gaster, MD  meperidine (DEMEROL) 50 MG tablet Take 1 tablet (50 mg total) by mouth at bedtime as needed for severe pain (for severe pain). Patient not taking: Reported on 12/16/2019 10/01/19   Hilts, Legrand Como, MD  metFORMIN (GLUCOPHAGE) 500 MG tablet Take 1 tablet (500 mg total) by mouth 2 (two) times daily with a meal. 09/16/19   Fulp, Cammie, MD  metoprolol tartrate (LOPRESSOR) 50 MG tablet TAKE 1 TABLET (50 MG TOTAL) BY MOUTH 2 (TWO) TIMES DAILY. 10/11/19   Fulp, Cammie, MD  nabumetone (RELAFEN) 750 MG tablet Take 1 tablet (750 mg total) by mouth 2 (two) times daily as needed. 07/28/19   Hilts, Legrand Como, MD  omeprazole (PRILOSEC) 40 MG capsule Take 1 capsule (40 mg total) by mouth 2 (two) times daily. To decrease stomach acid 09/16/19   Fulp, Cammie, MD  oxyCODONE-acetaminophen (PERCOCET/ROXICET) 5-325 MG tablet Take 1-2 tablets by mouth 2 (two) times daily as needed for severe pain. 12/20/19   Fulp, Cammie, MD  traMADol (ULTRAM) 50 MG tablet Take 1 tablet (50 mg total) by mouth 2 (two) times daily. As needed for pain; 30 day supply 12/01/19   Fulp, Cammie, MD  Vitamin D, Cholecalciferol, 25 MCG (1000 UT) TABS Take 1 tablet by mouth once daily 02/18/19   Tysinger, Camelia Eng, PA-C    Allergies      Lisinopril, Potassium chloride, Pneumococcal vaccines, and Vicodin [hydrocodone-acetaminophen]  Review of Systems   Review of Systems  Constitutional: Negative for chills and fever.  HENT: Positive for congestion, ear pain, postnasal drip, rhinorrhea and sore throat. Negative for sinus pain.   Eyes: Negative for pain and redness.  Respiratory: Positive for cough. Negative for shortness of breath.   Cardiovascular: Negative for chest pain.  Gastrointestinal: Negative for abdominal pain.  Musculoskeletal: Negative for neck pain.  Skin: Negative for rash.  Neurological: Negative for dizziness and headaches.  Psychiatric/Behavioral: Negative for sleep disturbance.    Physical Exam Updated Vital Signs BP (!) 145/94 (BP Location: Right Arm)   Pulse 76   Temp 97.6 F (36.4 C) (Oral)   Resp 17   LMP 06/29/2015 (Exact Date)   SpO2 96%   Physical Exam Vitals and nursing note reviewed.  Constitutional:      General: She is not in acute distress.    Appearance: Normal appearance. She is not ill-appearing.  HENT:     Head: Normocephalic and atraumatic.     Ears:     Comments: Bilateral TMs are clear.  There is evidence of clear fluid behind the right TM.  Small bubbles present.  No injection or erythema of TM.  EACs bilaterally are clear without laceration or purulence.  No tenderness with otoscope exam.  No tragal or external auricular tenderness.    Nose:     Comments: Nasal mucosa is somewhat erythematous and boggy in appearance.  Scant clear rhinorrhea present.    Mouth/Throat:     Mouth: Mucous membranes are moist.  Eyes:     General: No scleral icterus.       Right eye: No discharge.        Left eye:  No discharge.     Conjunctiva/sclera: Conjunctivae normal.  Cardiovascular:     Rate and Rhythm: Normal rate.  Pulmonary:     Effort: Pulmonary effort is normal.     Breath sounds: No stridor.     Comments: Very faint end expiratory wheeze.  Lungs are otherwise clear to  auscultation bilaterally.  Patient has no increased work of breathing.  Speaking full sentences without difficulty. Neurological:     Mental Status: She is alert and oriented to person, place, and time. Mental status is at baseline.     ED Results / Procedures / Treatments   Labs (all labs ordered are listed, but only abnormal results are displayed) Labs Reviewed  SARS CORONAVIRUS 2 (TAT 6-24 HRS)    EKG None  Radiology No results found.  Procedures Procedures (including critical care time)  Medications Ordered in ED Medications - No data to display  ED Course  I have reviewed the triage vital signs and the nursing notes.  Pertinent labs & imaging results that were available during my care of the patient were reviewed by me and considered in my medical decision making (see chart for details).    MDM Rules/Calculators/A&P                       Patient is 54 year old female with a history of allergic rhinitis presented today with symptoms consistent with allergic rhinitis or upper respiratory tract infection.  She has no abnormalities on physical exam apart from boggy nasal mucosa, what appears to be congestion by the inner ear on the right side and very mild wheeze on lung exam.  She is not short of breath, speaking full sounds without difficulty.  No chest pain or tightness.  Patient is concerned that she may have been exposed to Covid.  Will obtain 24-hour send a PCR test.  No indication for chest x-ray or lab work today.  Doubt pneumonia as patient is afebrile and well-appearing.  Suspect that this is allergic rhinitis.  Will provide patient with Flonase and Benadryl recommendations.  Will discharge patient with work note and instructions to check my chart for Covid test results.   Janani Langwell was evaluated in Emergency Department on 01/09/2020 for the symptoms described in the history of present illness. She was evaluated in the context of the global COVID-19 pandemic,  which necessitated consideration that the patient might be at risk for infection with the SARS-CoV-2 virus that causes COVID-19. Institutional protocols and algorithms that pertain to the evaluation of patients at risk for COVID-19 are in a state of rapid change based on information released by regulatory bodies including the CDC and federal and state organizations. These policies and algorithms were followed during the patient's care in the ED.  Final Clinical Impression(s) / ED Diagnoses Final diagnoses:  Seasonal allergies  Dysfunction of right eustachian tube  Viral upper respiratory tract infection    Rx / DC Orders ED Discharge Orders         Ordered    fluticasone (FLONASE) 50 MCG/ACT nasal spray  Daily     01/09/20 1405    diphenhydrAMINE (BENADRYL) 25 MG tablet  Every 6 hours PRN     01/09/20 1405           Tedd Sias, [place] 01/09/20 1408    Dorie Rank, MD 01/09/20 2156

## 2020-01-10 ENCOUNTER — Ambulatory Visit: Payer: Self-pay | Attending: Family | Admitting: Family

## 2020-01-10 DIAGNOSIS — J309 Allergic rhinitis, unspecified: Secondary | ICD-10-CM

## 2020-01-10 DIAGNOSIS — J4540 Moderate persistent asthma, uncomplicated: Secondary | ICD-10-CM

## 2020-01-10 DIAGNOSIS — J302 Other seasonal allergic rhinitis: Secondary | ICD-10-CM

## 2020-01-10 LAB — SARS CORONAVIRUS 2 (TAT 6-24 HRS): SARS Coronavirus 2: NEGATIVE

## 2020-01-10 MED ORDER — PREDNISONE 20 MG PO TABS: ORAL_TABLET | ORAL | 0 refills | Status: DC

## 2020-01-10 MED FILL — FLUTICASONE PROP 50 MCG SPR: 50 | 30 days supply | Qty: 16 | Fill #0

## 2020-01-10 MED FILL — ?PREDINSONE 10MG TABLETS: 10 | 5 days supply | Qty: 14 | Fill #0

## 2020-01-10 NOTE — Progress Notes (Signed)
Virtual Visit via Telephone Note  I connected with Sydney Williams, on 01/10/2020 at 3:26 PM by telephone due to the COVID-19 pandemic and verified that I am speaking with the correct person using two identifiers.  Due to current restrictions/limitations of in-office visits due to the COVID-19 pandemic, this scheduled clinical appointment was converted to a telehealth visit.   Consent: I discussed the limitations, risks, security and privacy concerns of performing an evaluation and management service by telephone and the availability of in person appointments. I also discussed with the patient that there may be a patient responsible charge related to this service. The patient expressed understanding and agreed to proceed.  Location of Patient: Home  Location of Provider: [facility] [place]  Persons participating in Telemedicine visit: Damonica Olano Durene Fruits, NP Emilio Aspen, CMA  History of Present Illness: Sydney Williams is a 54 year old female with history of essential hypertension, moderate persistent asthma, allergic rhinitis, gastroesophageal reflux disease without esophagitis, obesity, smoker, hyperlipidemia, migraine, frequent headaches, hypokalemia, hypomagnesemia, depression, low back pain, and vitamin D deficiency.  Her concern today is seasonal allergies follow-up.  1. SEASONAL ALLERGIES FOLLOW-UP:  Last visit January 09, 2020 at [facility] emergency department by Dr. Tomi Bamberger.  Patient presented with 4 days of congestion, mild cough, right ear pain, sore throat, and postnasal drip.  Covid test resulted negative. During that encounter it was determined that patient symptoms are related to seasonal allergies, dysfunction of right eustachian tube, and viral upper respiratory tract infection. Patient discharged home with fluticasone (taken once today) and diphenhydramine (taken twice today).   Today presents with wheezing, coughing, shortness of breath,  and headache. Denies chest pain. Patient has history of moderate persistent asthma of which she reports flares with severe seasonal allergies. Currently on albuterol as needed, albuterol nebulizer treatment, and fluticasone-salmeterol for management of moderate persistent asthma. Last took albuterol inhaler 2 PM and took albuterol breathing treatment at 8 AM which helps. Requesting additional medication for management.  Past Medical History:  Diagnosis Date  . Asthma    2015 hospitaliation for asthma  . Diabetes mellitus    vs impaired fasting glucose  . Family history of breast cancer   . Fibroids 2010  . Genital herpes   . GERD (gastroesophageal reflux disease)   . Hyperlipidemia   . Hypertension   . Impaired fasting blood sugar   . Migraines   . Obesity   . Smoker    Allergies  Allergen Reactions  . Lisinopril Swelling  . Potassium Chloride Shortness Of Breath and Swelling    Tolerates IV KCl, reaction only to PO product  . Pneumococcal Vaccines Swelling and Other (See Comments)    Reaction:  Swelling at injection site  . Vicodin [Hydrocodone-Acetaminophen] Itching and Rash    Current Outpatient Medications on File Prior to Visit  Medication Sig Dispense Refill  . acyclovir (ZOVIRAX) 200 MG capsule Take 1 capsule (200 mg total) by mouth 2 (two) times daily. 180 capsule 3  . albuterol (PROVENTIL HFA;VENTOLIN HFA) 108 (90 Base) MCG/ACT inhaler Inhale 1-2 puffs into the lungs every 6 (six) hours as needed for wheezing or shortness of breath. 1 Inhaler 1  . allopurinol (ZYLOPRIM) 100 MG tablet Take 1 tablet (100 mg total) by mouth daily. 30 tablet 6  . amLODipine (NORVASC) 10 MG tablet TAKE 1 TABLET (10 MG TOTAL) BY MOUTH DAILY. TO LOWER BLOOD PRESSURE 30 tablet 2  . aspirin (EQ ASPIRIN ADULT LOW DOSE) 81 MG EC tablet  Take 1 tablet (81 mg total) by mouth daily. Swallow whole. 90 tablet 3  . atorvastatin (LIPITOR) 40 MG tablet TAKE 1 TABLET (40 MG TOTAL) BY MOUTH DAILY. 30 tablet 2   . baclofen (LIORESAL) 10 MG tablet TAKE 1-2 TABLETS (10-20 MG TOTAL) BY MOUTH 3 (THREE) TIMES DAILY AS NEEDED FOR MUSCLE SPASMS. 90 tablet 1  . buPROPion (WELLBUTRIN XL) 150 MG 24 hr tablet TAKE 1 TABLET BY MOUTH ONCE DAILY (Patient not taking: Reported on 07/09/2019) 90 tablet 0  . diazepam (VALIUM) 2 MG tablet Take 1 tablet (2 mg total) by mouth every 6 (six) hours as needed for muscle spasms. 12 tablet 0  . diphenhydrAMINE (BENADRYL) 25 MG tablet Take 1 tablet (25 mg total) by mouth every 6 (six) hours as needed. 30 tablet 0  . EPINEPHrine (EPIPEN 2-PAK) 0.3 mg/0.3 mL IJ SOAJ injection Inject 0.3 mLs (0.3 mg total) into the muscle once as needed for up to 1 dose (for severe allergic reaction). CAll 911 immediately if you have to use this medicine 1 each 11  . fluticasone (FLONASE) 50 MCG/ACT nasal spray Place 2 sprays into both nostrils daily. 15.8 mL 1  . Fluticasone-Salmeterol (ADVAIR) 250-50 MCG/DOSE AEPB Inhale 1 puff into the lungs 2 (two) times daily. 60 each 11  . furosemide (LASIX) 20 MG tablet One pill once per day if needed for swelling 20 tablet 0  . gabapentin (NEURONTIN) 100 MG capsule 2 in morning, 2 in late afternoon and 1-2 at bedtime 180 capsule 3  . hydrochlorothiazide (HYDRODIURIL) 25 MG tablet Take 1 tablet (25 mg total) by mouth daily. To lower blood pressure 90 tablet 3  . ibuprofen (ADVIL) 600 MG tablet Take 1 tablet (600 mg total) by mouth every 8 (eight) hours as needed for moderate pain. Take after eating (Patient not taking: Reported on 12/16/2019) 60 tablet 1  . meperidine (DEMEROL) 50 MG tablet Take 1 tablet (50 mg total) by mouth at bedtime as needed for severe pain (for severe pain). (Patient not taking: Reported on 12/16/2019) 10 tablet 0  . metFORMIN (GLUCOPHAGE) 500 MG tablet Take 1 tablet (500 mg total) by mouth 2 (two) times daily with a meal. 90 tablet 1  . metoprolol tartrate (LOPRESSOR) 50 MG tablet TAKE 1 TABLET (50 MG TOTAL) BY MOUTH 2 (TWO) TIMES DAILY. 60  tablet 2  . nabumetone (RELAFEN) 750 MG tablet Take 1 tablet (750 mg total) by mouth 2 (two) times daily as needed. 60 tablet 6  . omeprazole (PRILOSEC) 40 MG capsule Take 1 capsule (40 mg total) by mouth 2 (two) times daily. To decrease stomach acid 60 capsule 3  . oxyCODONE-acetaminophen (PERCOCET/ROXICET) 5-325 MG tablet Take 1-2 tablets by mouth 2 (two) times daily as needed for severe pain. 30 tablet 0  . traMADol (ULTRAM) 50 MG tablet Take 1 tablet (50 mg total) by mouth 2 (two) times daily. As needed for pain; 30 day supply 60 tablet 1  . Vitamin D, Cholecalciferol, 25 MCG (1000 UT) TABS Take 1 tablet by mouth once daily 90 tablet 0   No current facility-administered medications on file prior to visit.    Observations/Objective: Alert and oriented x 3. Not in acute distress. Physical examination not completed as this is a telemedicine visit.  Assessment and Plan: 1. Moderate persistent asthma, unspecified whether complicated:  - predniSONE (DELTASONE) 20 MG tablet; Take 2 tablets (40 mg total) by mouth daily with breakfast for 2 days, THEN 1.5 tablets (30 mg total) daily with  breakfast for 1 day, THEN 1 tablet (20 mg total) daily with breakfast for 1 day, THEN 0.5 tablets (10 mg total) daily with breakfast for 1 day.  Dispense: 7 tablet; Refill: 0 -We will try course of tapered prednisone to assist with management of mild persistent asthma complicated by allergic rhinitis.  2. Allergic rhinitis, unspecified seasonality, unspecified trigger: - predniSONE (DELTASONE) 20 MG tablet; Take 2 tablets (40 mg total) by mouth daily with breakfast for 2 days, THEN 1.5 tablets (30 mg total) daily with breakfast for 1 day, THEN 1 tablet (20 mg total) daily with breakfast for 1 day, THEN 0.5 tablets (10 mg total) daily with breakfast for 1 day.  Dispense: 7 tablet; Refill: 0 -We will try course of tapered prednisone to assist with management of mild persistent asthma complicated by allergic  rhinitis  Follow Up Instructions: Patient was given clear instructions to go to Emergency Department or return to medical center if symptoms don't improve, worsen, or new problems develop.The patient verbalized understanding.  I discussed the assessment and treatment plan with the patient. The patient was provided an opportunity to ask questions and all were answered. The patient agreed with the plan and demonstrated an understanding of the instructions.   The patient was advised to call back or seek an in-person evaluation if the symptoms worsen or if the condition fails to improve as anticipated.  I provided 15 minutes total of non-face-to-face time during this encounter including median intraservice time, reviewing previous notes, labs, imaging, medications, management and patient verbalized understanding.   Camillia Herter, NP  [facility] and [facility], [place]   01/10/2020, 3:26 PM

## 2020-01-10 NOTE — Patient Instructions (Addendum)
Prednisone for management of moderate persistent asthma complicated by allergic rhinitis. Allergic Rhinitis, Adult Allergic rhinitis is a reaction to allergens in the air. Allergens are tiny specks (particles) in the air that cause your body to have an allergic reaction. This condition cannot be passed from person to person (is not contagious). Allergic rhinitis cannot be cured, but it can be controlled. There are two types of allergic rhinitis:  Seasonal. This type is also called hay fever. It happens only during certain times of the year.  Perennial. This type can happen at any time of the year. What are the causes? This condition may be caused by:  Pollen from grasses, trees, and weeds.  House dust mites.  Pet dander.  Mold. What are the signs or symptoms? Symptoms of this condition include:  Sneezing.  Runny or stuffy nose (nasal congestion).  A lot of mucus in the back of the throat (postnasal drip).  Itchy nose.  Tearing of the eyes.  Trouble sleeping.  Being sleepy during day. How is this treated? There is no cure for this condition. You should avoid things that trigger your symptoms (allergens). Treatment can help to relieve symptoms. This may include:  Medicines that block allergy symptoms, such as antihistamines. These may be given as a shot, nasal spray, or pill.  Shots that are given until your body becomes less sensitive to the allergen (desensitization).  Stronger medicines, if all other treatments have not worked. Follow these instructions at home: Avoiding allergens   Find out what you are allergic to. Common allergens include smoke, dust, and pollen.  Avoid them if you can. These are some of the things that you can do to avoid allergens: ? Replace carpet with wood, tile, or vinyl flooring. Carpet can trap dander and dust. ? Clean any mold found in the home. ? Do not smoke. Do not allow smoking in your home. ? Change your heating and air  conditioning filter at least once a month. ? During allergy season:  Keep windows closed as much as you can. If possible, use air conditioning when there is a lot of pollen in the air.  Use a special filter for allergies with your furnace and air conditioner.  Plan outdoor activities when pollen counts are lowest. This is usually during the early morning or evening hours.  If you do go outdoors when pollen count is high, wear a special mask for people with allergies.  When you come indoors, take a shower and change your clothes before sitting on furniture or bedding. General instructions  Do not use fans in your home.  Do not hang clothes outside to dry.  Wear sunglasses to keep pollen out of your eyes.  Wash your hands right away after you touch household pets.  Take over-the-counter and prescription medicines only as told by your doctor.  Keep all follow-up visits as told by your doctor. This is important. Contact a doctor if:  You have a fever.  You have a cough that does not go away (is persistent).  You start to make whistling sounds when you breathe (wheeze).  Your symptoms do not get better with treatment.  You have thick fluid coming from your nose.  You start to have nosebleeds. Get help right away if:  Your tongue or your lips are swollen.  You have trouble breathing.  You feel dizzy or you feel like you are going to pass out (faint).  You have cold sweats. Summary  Allergic rhinitis is a  reaction to allergens in the air.  This condition may be caused by allergens. These include pollen, dust mites, pet dander, and mold.  Symptoms include a runny, itchy nose, sneezing, or tearing eyes. You may also have trouble sleeping or feel sleepy during the day.  Treatment includes taking medicines and avoiding allergens. You may also get shots or take stronger medicines.  Get help if you have a fever or a cough that does not stop. Get help right away if you are  short of breath. This information is not intended to replace advice given to you by your health care provider. Make sure you discuss any questions you have with your health care provider. Document Revised: 02/02/2019 Document Reviewed: 05/05/2018 Elsevier Patient Education  [place].

## 2020-01-11 ENCOUNTER — Other Ambulatory Visit: Payer: Self-pay | Admitting: Family Medicine

## 2020-01-11 DIAGNOSIS — M5416 Radiculopathy, lumbar region: Secondary | ICD-10-CM

## 2020-01-11 MED FILL — NABUMETONE 750 MG TABS: 750 | 30 days supply | Qty: 60 | Fill #5

## 2020-01-13 ENCOUNTER — Other Ambulatory Visit: Payer: Self-pay | Admitting: Family Medicine

## 2020-01-13 DIAGNOSIS — I10 Essential (primary) hypertension: Secondary | ICD-10-CM

## 2020-01-13 MED FILL — ?IBUPROFEN 600 MG TABLETS: 600 | 2 days supply | Qty: 7 | Fill #0

## 2020-01-14 MED FILL — ?METOPROLOL TART 50 MG TABL: 50 | 30 days supply | Qty: 60 | Fill #0

## 2020-01-15 ENCOUNTER — Encounter (HOSPITAL_COMMUNITY): Payer: Self-pay

## 2020-01-15 ENCOUNTER — Emergency Department (HOSPITAL_COMMUNITY)
Admission: EM | Admit: 2020-01-15 | Discharge: 2020-01-15 | Disposition: A | Discharge status: Home / Self Care | Payer: Medicaid Other | Attending: Emergency Medicine | Admitting: Emergency Medicine

## 2020-01-15 ENCOUNTER — Emergency Department (HOSPITAL_COMMUNITY): Payer: Medicaid Other

## 2020-01-15 ENCOUNTER — Other Ambulatory Visit: Payer: Self-pay

## 2020-01-15 DIAGNOSIS — E119 Type 2 diabetes mellitus without complications: Secondary | ICD-10-CM | POA: Insufficient documentation

## 2020-01-15 DIAGNOSIS — J209 Acute bronchitis, unspecified: Secondary | ICD-10-CM

## 2020-01-15 DIAGNOSIS — J4540 Moderate persistent asthma, uncomplicated: Secondary | ICD-10-CM

## 2020-01-15 DIAGNOSIS — E876 Hypokalemia: Secondary | ICD-10-CM | POA: Insufficient documentation

## 2020-01-15 DIAGNOSIS — Z7984 Long term (current) use of oral hypoglycemic drugs: Secondary | ICD-10-CM | POA: Insufficient documentation

## 2020-01-15 DIAGNOSIS — J309 Allergic rhinitis, unspecified: Secondary | ICD-10-CM

## 2020-01-15 DIAGNOSIS — I10 Essential (primary) hypertension: Secondary | ICD-10-CM | POA: Insufficient documentation

## 2020-01-15 DIAGNOSIS — Z79899 Other long term (current) drug therapy: Secondary | ICD-10-CM | POA: Insufficient documentation

## 2020-01-15 DIAGNOSIS — Z7982 Long term (current) use of aspirin: Secondary | ICD-10-CM | POA: Insufficient documentation

## 2020-01-15 DIAGNOSIS — F17210 Nicotine dependence, cigarettes, uncomplicated: Secondary | ICD-10-CM | POA: Insufficient documentation

## 2020-01-15 DIAGNOSIS — J45901 Unspecified asthma with (acute) exacerbation: Secondary | ICD-10-CM | POA: Insufficient documentation

## 2020-01-15 LAB — CBC WITH DIFFERENTIAL/PLATELET
Abs Immature Granulocytes: 0.04 10*3/uL (ref 0.00–0.07)
Basophils Absolute: 0.1 10*3/uL (ref 0.0–0.1)
Basophils Relative: 1 %
Eosinophils Absolute: 0.2 10*3/uL (ref 0.0–0.5)
Eosinophils Relative: 2 %
HCT: 44.3 % (ref 36.0–46.0)
Hemoglobin: 14.1 g/dL (ref 12.0–15.0)
Immature Granulocytes: 0 %
Lymphocytes Relative: 33 %
Lymphs Abs: 3.5 10*3/uL (ref 0.7–4.0)
MCH: 29.9 pg (ref 26.0–34.0)
MCHC: 31.8 g/dL (ref 30.0–36.0)
MCV: 94.1 fL (ref 80.0–100.0)
Monocytes Absolute: 1 10*3/uL (ref 0.1–1.0)
Monocytes Relative: 9 %
Neutro Abs: 5.8 10*3/uL (ref 1.7–7.7)
Neutrophils Relative %: 55 %
Platelets: 318 10*3/uL (ref 150–400)
RBC: 4.71 MIL/uL (ref 3.87–5.11)
RDW: 15.4 % (ref 11.5–15.5)
WBC: 10.6 10*3/uL — ABNORMAL HIGH (ref 4.0–10.5)
nRBC: 0 % (ref 0.0–0.2)

## 2020-01-15 LAB — COMPREHENSIVE METABOLIC PANEL
ALT: 17 U/L (ref 0–44)
AST: 16 U/L (ref 15–41)
Albumin: 3.8 g/dL (ref 3.5–5.0)
Alkaline Phosphatase: 94 U/L (ref 38–126)
Anion gap: 10 (ref 5–15)
BUN: 11 mg/dL (ref 6–20)
CO2: 28 mmol/L (ref 22–32)
Calcium: 9.2 mg/dL (ref 8.9–10.3)
Chloride: 102 mmol/L (ref 98–111)
Creatinine, Ser: 0.49 mg/dL (ref 0.44–1.00)
GFR calc Af Amer: >60 mL/min (ref >60)
GFR calc non Af Amer: >60 mL/min (ref >60)
Glucose, Bld: 115 mg/dL — ABNORMAL HIGH (ref 70–99)
Potassium: 3.1 mmol/L — ABNORMAL LOW (ref 3.5–5.1)
Sodium: 140 mmol/L (ref 135–145)
Total Bilirubin: 0.5 mg/dL (ref 0.3–1.2)
Total Protein: 8.1 g/dL (ref 6.5–8.1)

## 2020-01-15 LAB — D-DIMER, QUANTITATIVE: D-Dimer, Quant: 0.46 ug/mL-FEU (ref 0.00–0.50)

## 2020-01-15 MED ORDER — PREDNISONE 20 MG PO TABS
60.0000 mg | ORAL_TABLET | Freq: Once | ORAL | Status: AC
  Administered 2020-01-15: 60 mg via ORAL
  Filled 2020-01-15: qty 3

## 2020-01-15 MED ORDER — PREDNISONE 20 MG PO TABS: 60.0000 mg | ORAL_TABLET | Freq: Every day | ORAL | 0 refills | Status: DC

## 2020-01-15 MED ORDER — IPRATROPIUM-ALBUTEROL 0.5-2.5 (3) MG/3ML IN SOLN
3.0000 mL | Freq: Once | RESPIRATORY_TRACT | Status: AC
  Administered 2020-01-15: 3 mL via RESPIRATORY_TRACT
  Filled 2020-01-15: qty 3

## 2020-01-15 MED ORDER — ALBUTEROL SULFATE (2.5 MG/3ML) 0.083% IN NEBU: 2.5000 mg | INHALATION_SOLUTION | RESPIRATORY_TRACT | 0 refills | Status: DC | PRN

## 2020-01-15 MED ORDER — POTASSIUM CHLORIDE 10 MEQ/100ML IV SOLN
10.0000 meq | Freq: Once | INTRAVENOUS | Status: AC
  Administered 2020-01-15: 10 meq via INTRAVENOUS
  Filled 2020-01-15: qty 100

## 2020-01-15 NOTE — ED Provider Notes (Signed)
[facility] DEPT Provider Note   CSN: [id number] Arrival date & time: 01/15/20  0009   History Chief Complaint  Patient presents with  . Shortness of Breath    Sydney Williams is a 54 y.o. female.  The history is provided by the patient.  Shortness of Breath She has history of hypertension, hyperlipidemia, asthma and comes in because of cough and difficulty breathing for the last week.  Cough is productive of yellow sputum.  She denies fever, chills, sweats.  She is complaining of pain in the right lateral chest over the last 3-4days.  She has been using her home albuterol inhaler and Advair inhaler without relief.  She does have a nebulizer but does not have medication for it.  She had been seen in the emergency department and was put on a course of prednisone which has not helped and also given prescription for fluticasone nasal spray.  She is also taking over-the-counter guaifenesin without any relief.  She denies exposure to COVID-19.  She did not receive the influenza vaccination has not received the COVID-19 vaccination.  She is concerned that she is allergic to the influenza vaccine and does not know if she can take the COVID-19 vaccine.  Of note, she has been on crutches for the last 6 months because of low back pain.  Past Medical History:  Diagnosis Date  . Asthma    2015 hospitaliation for asthma  . Diabetes mellitus    vs impaired fasting glucose  . Family history of breast cancer   . Fibroids 2010  . Genital herpes   . GERD (gastroesophageal reflux disease)   . Hyperlipidemia   . Hypertension   . Impaired fasting blood sugar   . Migraines   . Obesity   . Smoker     Patient Active Problem List   Diagnosis Date Noted  . Family history of cervical cancer 02/02/2018  . Family history of breast cancer 02/02/2018  . Vitamin D deficiency 02/02/2018  . Need for hepatitis C screening test 02/02/2018  . Low back pain 09/29/2017  . Abnormal  myocardial perfusion study   . Depression 06/19/2017  . Hypokalemia 06/19/2017  . Hypomagnesemia 06/19/2017  . Vaccine counseling 01/08/2017  . 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 Screening for cervical cancer 01/08/2017  . Vision exam with abnormal findings 01/08/2017  . Snoring 09/11/2016  . History of migraine 09/11/2016  . Frequent headaches 09/11/2016  . Muscle cramp 09/11/2016  . Encounter for health maintenance examination in adult 12/04/2015  . Heel pain, bilateral 12/04/2015  . Screening for breast cancer 12/04/2015  . Need for influenza vaccination 12/04/2015  . Moderate persistent asthma 06/01/2015  . Gastroesophageal reflux disease without esophagitis 06/01/2015  . Rhinitis, allergic 06/01/2015  . Genital herpes 11/30/2014  . Smoker 11/30/2014  . Essential hypertension 11/30/2014  . Hyperlipidemia 11/30/2014  . Impaired fasting blood sugar 11/30/2014  . Obesity 07/02/2012    Past Surgical History:  Procedure Laterality Date  . COLONOSCOPY  03/13/2016   Dr. Carlean Purl, normal, repeat 2027  . CYST EXCISION     left neck/postauricular region, benign  . LAPAROSCOPIC ABDOMINAL EXPLORATION     removal of ectopic preg  . LEFT HEART CATH AND CORONARY ANGIOGRAPHY N/A 06/23/2017   Procedure: LEFT HEART CATH AND CORONARY ANGIOGRAPHY;  Surgeon: Nelva Bush, MD;  Location: [facility];  Service: Cardiovascular;  Laterality: N/A;  . TUBAL LIGATION       OB History  Gravida  5   Para  1   Term  1   Preterm      AB  2   Living  3     SAB  1   TAB      Ectopic  1   Multiple      Live Births              Family History  Problem Relation Age of Onset  . Diabetes Mother   . Hypertension Mother   . Aneurysm Mother   . Stroke Mother   . Cancer Mother        cervical cancer  . Cancer Maternal Aunt        breast  . Cancer Cousin        breast/breast  . Heart disease Neg Hx   . Colon cancer Neg Hx     Social History     Tobacco Use  . Smoking status: Current Every Day Smoker    Packs/day: 0.25    Years: 14.00    Pack years: 3.50    Types: Cigarettes  . Smokeless tobacco: Never Used  Substance Use Topics  . Alcohol use: Yes    Comment: occ  . Drug use: No    Home Medications Prior to Admission medications   Medication Sig Start Date End Date Taking? Authorizing Provider  acyclovir (ZOVIRAX) 200 MG capsule Take 1 capsule (200 mg total) by mouth 2 (two) times daily. 08/04/19   Fulp, Cammie, MD  albuterol (PROVENTIL HFA;VENTOLIN HFA) 108 (90 Base) MCG/ACT inhaler Inhale 1-2 puffs into the lungs every 6 (six) hours as needed for wheezing or shortness of breath. 02/03/18   Tysinger, Camelia Eng, PA-C  allopurinol (ZYLOPRIM) 100 MG tablet Take 1 tablet (100 mg total) by mouth daily. 10/01/19   Fulp, Cammie, MD  amLODipine (NORVASC) 10 MG tablet TAKE 1 TABLET (10 MG TOTAL) BY MOUTH DAILY. TO LOWER BLOOD PRESSURE 12/01/19   Fulp, Cammie, MD  aspirin (EQ ASPIRIN ADULT LOW DOSE) 81 MG EC tablet Take 1 tablet (81 mg total) by mouth daily. Swallow whole. 02/03/18   Tysinger, Camelia Eng, PA-C  atorvastatin (LIPITOR) 40 MG tablet TAKE 1 TABLET (40 MG TOTAL) BY MOUTH DAILY. 12/31/19   Fulp, Cammie, MD  baclofen (LIORESAL) 10 MG tablet TAKE 1-2 TABLETS (10-20 MG TOTAL) BY MOUTH 3 (THREE) TIMES DAILY AS NEEDED FOR MUSCLE SPASMS. 12/10/19   Hilts, Legrand Como, MD  buPROPion (WELLBUTRIN XL) 150 MG 24 hr tablet TAKE 1 TABLET BY MOUTH ONCE DAILY Patient not taking: Reported on 07/09/2019 12/11/18   Tysinger, Camelia Eng, PA-C  diazepam (VALIUM) 2 MG tablet Take 1 tablet (2 mg total) by mouth every 6 (six) hours as needed for muscle spasms. 09/25/19   Lacretia Leigh, MD  diphenhydrAMINE (BENADRYL) 25 MG tablet Take 1 tablet (25 mg total) by mouth every 6 (six) hours as needed. 01/09/20   Tedd Sias, PA  EPINEPHrine (EPIPEN 2-PAK) 0.3 mg/0.3 mL IJ SOAJ injection Inject 0.3 mLs (0.3 mg total) into the muscle once as needed for up to 1 dose (for  severe allergic reaction). CAll 911 immediately if you have to use this medicine 12/16/19   Fulp, Cammie, MD  fluticasone (FLONASE) 50 MCG/ACT nasal spray Place 2 sprays into both nostrils daily. 01/09/20   Tedd Sias, PA  Fluticasone-Salmeterol (ADVAIR) 250-50 MCG/DOSE AEPB Inhale 1 puff into the lungs 2 (two) times daily. 02/03/18   Tysinger, Camelia Eng, PA-C  furosemide (LASIX) 20  MG tablet One pill once per day if needed for swelling 12/16/19   Fulp, Cammie, MD  gabapentin (NEURONTIN) 100 MG capsule 2 in morning, 2 in late afternoon and 1-2 at bedtime 12/16/19   Fulp, Cammie, MD  hydrochlorothiazide (HYDRODIURIL) 25 MG tablet Take 1 tablet (25 mg total) by mouth daily. To lower blood pressure 04/22/19   Fulp, Cammie, MD  ibuprofen (ADVIL) 600 MG tablet TAKE 1 TABLET (600 MG TOTAL) BY MOUTH EVERY 8 (EIGHT) HOURS AS NEEDED FOR MODERATE PAIN. TAKE AFTER EATING 01/11/20   Fulp, Cammie, MD  meperidine (DEMEROL) 50 MG tablet Take 1 tablet (50 mg total) by mouth at bedtime as needed for severe pain (for severe pain). Patient not taking: Reported on 12/16/2019 10/01/19   Hilts, Legrand Como, MD  metFORMIN (GLUCOPHAGE) 500 MG tablet Take 1 tablet (500 mg total) by mouth 2 (two) times daily with a meal. 09/16/19   Fulp, Cammie, MD  metoprolol tartrate (LOPRESSOR) 50 MG tablet TAKE 1 TABLET (50 MG TOTAL) BY MOUTH 2 (TWO) TIMES DAILY. 01/14/20   Fulp, Cammie, MD  nabumetone (RELAFEN) 750 MG tablet Take 1 tablet (750 mg total) by mouth 2 (two) times daily as needed. 07/28/19   Hilts, Legrand Como, MD  omeprazole (PRILOSEC) 40 MG capsule Take 1 capsule (40 mg total) by mouth 2 (two) times daily. To decrease stomach acid 09/16/19   Fulp, Cammie, MD  oxyCODONE-acetaminophen (PERCOCET/ROXICET) 5-325 MG tablet Take 1-2 tablets by mouth 2 (two) times daily as needed for severe pain. 12/20/19   Fulp, Cammie, MD  predniSONE (DELTASONE) 20 MG tablet Take 2 tablets (40 mg total) by mouth daily with breakfast for 2 days, THEN 1.5 tablets (30  mg total) daily with breakfast for 1 day, THEN 1 tablet (20 mg total) daily with breakfast for 1 day, THEN 0.5 tablets (10 mg total) daily with breakfast for 1 day. 01/10/20 01/15/20  Camillia Herter, NP  traMADol (ULTRAM) 50 MG tablet Take 1 tablet (50 mg total) by mouth 2 (two) times daily. As needed for pain; 30 day supply 12/01/19   Fulp, Cammie, MD  Vitamin D, Cholecalciferol, 25 MCG (1000 UT) TABS Take 1 tablet by mouth once daily 02/18/19   Tysinger, Camelia Eng, PA-C    Allergies    Lisinopril, Potassium chloride, Pneumococcal vaccines, and Vicodin [hydrocodone-acetaminophen]  Review of Systems   Review of Systems  Respiratory: Positive for shortness of breath.   All other systems reviewed and are negative.   Physical Exam Updated Vital Signs BP (!) 146/88 (BP Location: Right Arm)   Pulse 78   Temp 97.8 F (36.6 C) (Oral)   Resp (!) 22   Ht 5\' 11"  (1.803 m)   Wt 110.2 kg   LMP 06/29/2015 (Exact Date)   SpO2 99%   BMI 33.89 kg/m   Physical Exam Vitals and nursing note reviewed.   54 year old female, resting comfortably and in no acute distress. Vital signs are significant for elevated blood pressure and respiratory rate. Oxygen saturation is 99%, which is normal. Head is normocephalic and atraumatic. PERRLA, EOMI. Oropharynx is clear. Neck is nontender and supple without adenopathy or JVD. Back is nontender and there is no CVA tenderness. Lungs have scattered expiratory rhonchi without rales or wheezes. Chest is nontender. Heart has regular rate and rhythm without murmur. Abdomen is soft, flat, nontender without masses or hepatosplenomegaly and peristalsis is normoactive. Extremities have no cyanosis or edema, full range of motion is present. Skin is warm and dry without  rash. Neurologic: Mental status is normal, cranial nerves are intact, there are no motor or sensory deficits.  ED Results / Procedures / Treatments   Labs (all labs ordered are listed, but only abnormal  results are displayed) Labs Reviewed  CBC WITH DIFFERENTIAL/PLATELET - Abnormal; Notable for the following components:      Result Value   WBC 10.6 (*)    All other components within normal limits  COMPREHENSIVE METABOLIC PANEL - Abnormal; Notable for the following components:   Potassium 3.1 (*)    Glucose, Bld 115 (*)    All other components within normal limits  D-DIMER, QUANTITATIVE (NOT AT [facility])    EKG EKG Interpretation  Date/Time:  Saturday January 15 2020 00:56:30 EDT Ventricular Rate:  72 PR Interval:    QRS Duration: 104 QT Interval:  404 QTC Calculation: 443 R Axis:   16 Text Interpretation: Sinus rhythm Low voltage, precordial leads When compared with ECG of 06/20/2017, No significant change was found Confirmed by Delora Fuel ([id number]) on 01/15/2020 1:03:37 AM   Radiology DG Chest 2 View  Result Date: 01/15/2020 CLINICAL DATA:  Chest pain, shortness of breath, cough EXAM: CHEST - 2 VIEW COMPARISON:  12/21/2017 FINDINGS: The heart size and mediastinal contours are within normal limits. Both lungs are clear. The visualized skeletal structures are unremarkable. IMPRESSION: Normal study. Electronically Signed   By: Rolm Baptise M.D.   On: 01/15/2020 01:19    Procedures Procedures   Medications Ordered in ED Medications  potassium chloride 10 mEq in 100 mL IVPB (10 mEq Intravenous New Bag/Given 01/15/20 0235)  predniSONE (DELTASONE) tablet 60 mg (has no administration in time range)  ipratropium-albuterol (DUONEB) 0.5-2.5 (3) MG/3ML nebulizer solution 3 mL (3 mLs Nebulization Given 01/15/20 0239)    ED Course  I have reviewed the triage vital signs and the nursing notes.  Pertinent labs & imaging results that were available during my care of the patient were reviewed by me and considered in my medical decision making (see chart for details).  MDM Rules/Calculators/A&P Cough and dyspnea without evidence of bronchospasm old records reviewed showing prior ED visits for  asthma and bronchitis, ED visit March 14 at which time Covid PCR test was negative.  Will give trial of DuoNeb, will also screen for pulmonary embolism with D-dimer.  Chest x-ray was normal.  CBC is normal.  Metabolic panel is significant for hypokalemia.  Patient evidently is unable to take oral potassium, so we will give a dose of intravenous potassium..  She feels much better after nebulizer treatment.  Will discharge on a higher dose of prednisone and given prescription for albuterol for her home nebulizer.  Unfortunately, because of her difficulty with oral potassium, she cannot be given outpatient prescription for her hypokalemia.  Recommended follow-up with PCP in 1 week.  Final Clinical Impression(s) / ED Diagnoses Final diagnoses:  Acute bronchitis, unspecified organism  Hypokalemia    Rx / DC Orders ED Discharge Orders         Ordered    predniSONE (DELTASONE) 20 MG tablet  Daily with breakfast     01/15/20 0258    albuterol (PROVENTIL) (2.5 MG/3ML) 0.083% nebulizer solution  Every 4 hours PRN     01/15/20 0000000           Delora Fuel, MD [id number]

## 2020-01-15 NOTE — Discharge Instructions (Signed)
Return if you are having any problems. 

## 2020-01-15 NOTE — ED Triage Notes (Signed)
Arrived POV from home patient reports shortness of breath that has not improved with Albuterol, Advair, and Prednisone. Patient also reports bilateral rib pain with regular respirations.  Patient reports that Predisone usually works and clears things up, but it has not helped this time.

## 2020-01-15 NOTE — ED Notes (Signed)
Lab called to add on d-dimer.

## 2020-01-17 MED FILL — ALBUTEROL SUL 2.5 MG/3 ML S: (2.5 MG/3ML | 4 days supply | Qty: 75 | Fill #0

## 2020-01-17 MED FILL — predniSONE 20 MG TABS: 20 | 5 days supply | Qty: 15 | Fill #0

## 2020-01-19 ENCOUNTER — Encounter: Payer: Self-pay | Admitting: Family Medicine

## 2020-01-19 ENCOUNTER — Ambulatory Visit: Payer: Self-pay | Attending: Family Medicine | Admitting: Family Medicine

## 2020-01-19 ENCOUNTER — Other Ambulatory Visit: Payer: Self-pay

## 2020-01-19 DIAGNOSIS — J4540 Moderate persistent asthma, uncomplicated: Secondary | ICD-10-CM

## 2020-01-19 DIAGNOSIS — Z09 Encounter for follow-up examination after completed treatment for conditions other than malignant neoplasm: Secondary | ICD-10-CM

## 2020-01-19 DIAGNOSIS — E876 Hypokalemia: Secondary | ICD-10-CM

## 2020-01-19 DIAGNOSIS — R058 Other specified cough: Secondary | ICD-10-CM

## 2020-01-19 DIAGNOSIS — M5416 Radiculopathy, lumbar region: Secondary | ICD-10-CM

## 2020-01-19 DIAGNOSIS — R05 Cough: Secondary | ICD-10-CM

## 2020-01-19 MED ORDER — IBUPROFEN 600 MG PO TABS: 600.0000 mg | ORAL_TABLET | Freq: Three times a day (TID) | ORAL | 1 refills | Status: DC | PRN

## 2020-01-19 MED ORDER — AZITHROMYCIN 250 MG PO TABS: ORAL_TABLET | ORAL | 0 refills | Status: DC

## 2020-01-19 MED ORDER — PREDNISONE 20 MG PO TABS: ORAL_TABLET | ORAL | 0 refills | Status: DC

## 2020-01-19 MED FILL — BACLOFEN 10 MG TABLET: 10 | 15 days supply | Qty: 90 | Fill #1

## 2020-01-19 MED FILL — AZITHROMYCIN 250 MG TABLET: 250 | 5 days supply | Qty: 6 | Fill #0

## 2020-01-19 MED FILL — ?IBUPROFEN 600 MG TABLETS: 600 | 30 days supply | Qty: 90 | Fill #0

## 2020-01-19 MED FILL — predniSONE 20 MG TABS: 20 | 5 days supply | Qty: 10 | Fill #0

## 2020-01-19 NOTE — Progress Notes (Signed)
Asthma and med refills (baclofen, Ibuprofen)

## 2020-01-19 NOTE — Progress Notes (Signed)
Virtual Visit via Telephone Note  I connected with Sydney Williams on 01/19/20 at 10:50 AM EDT by telephone and verified that I am speaking with the correct person using two identifiers.   I discussed the limitations, risks, security and privacy concerns of performing an evaluation and management service by telephone and the availability of in person appointments. I also discussed with the patient that there may be a patient responsible charge related to this service. The patient expressed understanding and agreed to proceed.  Patient Location: Home Provider Location: [facility] Others participating in call: none  Called patient at 11:02 am and call went to voicemail; call later transferred to me by RMA, Emilio Aspen  History of Present Illness:      54 year old female who reports that she has had recent issues with worsening of asthma and allergic rhinitis.  She reports that she has been taking over-the-counter guaifenesin/cough medication and this is somewhat loosened her chest congestion however she continues to have a productive cough of sputum that ranges from brown to yellow.  She denies fever or chills but she feels very fatigued.  She reports some continued wheezing but this is improved with the use of DuoNeb treatments and her albuterol inhaler.  She was having issues with postnasal drainage and throat irritation but this is also improved.  She denies any current abdominal pain-no nausea or vomiting.  She reports that she continues to have issues with her chronic low back pain and would like a refill of ibuprofen.  She denies any current issues with acid reflux, no blood in the stool and no black stools.  She denies any current chest pain or palpitations, no headache or dizziness but sometimes feels lightheaded with recurrent coughing.  Past Medical History:  Diagnosis Date  . Asthma    2015 hospitaliation for asthma  . Diabetes mellitus    vs impaired fasting glucose  . Family  history of breast cancer   . Fibroids 2010  . Genital herpes   . GERD (gastroesophageal reflux disease)   . Hyperlipidemia   . Hypertension   . Impaired fasting blood sugar   . Migraines   . Obesity   . Smoker     Past Surgical History:  Procedure Laterality Date  . COLONOSCOPY  03/13/2016   Dr. Carlean Purl, normal, repeat 2027  . CYST EXCISION     left neck/postauricular region, benign  . LAPAROSCOPIC ABDOMINAL EXPLORATION     removal of ectopic preg  . LEFT HEART CATH AND CORONARY ANGIOGRAPHY N/A 06/23/2017   Procedure: LEFT HEART CATH AND CORONARY ANGIOGRAPHY;  Surgeon: Nelva Bush, MD;  Location: [facility];  Service: Cardiovascular;  Laterality: N/A;  . TUBAL LIGATION      Family History  Problem Relation Age of Onset  . Diabetes Mother   . Hypertension Mother   . Aneurysm Mother   . Stroke Mother   . Cancer Mother        cervical cancer  . Cancer Maternal Aunt        breast  . Cancer Cousin        breast/breast  . Heart disease Neg Hx   . Colon cancer Neg Hx     Social History   Tobacco Use  . Smoking status: Current Every Day Smoker    Packs/day: 0.25    Years: 14.00    Pack years: 3.50    Types: Cigarettes  . Smokeless tobacco: Never Used  Substance Use Topics  .  Alcohol use: Yes    Comment: occ  . Drug use: No     Allergies  Allergen Reactions  . Lisinopril Swelling  . Potassium Chloride Shortness Of Breath and Swelling    Tolerates IV KCl, reaction only to PO product  . Pneumococcal Vaccines Swelling and Other (See Comments)    Reaction:  Swelling at injection site  . Vicodin [Hydrocodone-Acetaminophen] Itching and Rash       Observations/Objective: No vital signs or physical exam conducted as visit was done via telephone Patient with a hoarse speaking voice and occasional cough but is able to complete full sentences without stopping due to shortness of breath.  Assessment and Plan: 1. Moderate persistent asthma with  exacerbation;2.  Productive cough;  5.  Encounter for examination following treatment at hospital; 4.  Hypokalemia -She is status post emergency department visit on 01/15/2020 due to cough which was productive of yellow sputum and increased shortness of breath as well as right lateral chest wall pain per ED note.  She was placed on additional prednisone as well as DuoNeb solution. Chest x-ray in emergency department was negative.  She however has complaint of continued productive cough.  Due to continued symptoms and productive cough, she will be treated with an azithromycin Z-Pak in addition to short prednisone course and she is to continue the use of albuterol inhaler as needed as well as use of DuoNeb treatments.  She is aware that if she has worsening of shortness of breath, difficulty breathing or any concerns that she should go to the emergency department for further evaluation and treatment.  She had previously been seen in the ED on 01/09/2020 due to nasal congestion and right ear pain and was diagnosed with eustachian tube dysfunction, URI and seasonal allergic rhinitis for which she was prescribed Flonase and Benadryl.  COVID-19 testing was negative.  She also had telemedicine visit on 01/10/2020 with another provider at which time she was placed on prednisone taper due to her complaint of asthma exacerbation with shortness of breath and wheezing.  -She was also found to have hypokalemia during her emergency department visit on 01/15/2020 with potassium level of 3.1.  She was provided with IV potassium replacement as she has had prior issues with being able to take oral potassium replacement.  She may wish to eat potassium which foods a few times per week to help avoid low potassium.  When she is feeling better she is encouraged to come into the office to have recheck of potassium. - predniSONE (DELTASONE) 20 MG tablet; 2 pills once daily x5 days; take after eating  Dispense: 10 tablet; Refill: 0 -  azithromycin (ZITHROMAX) 250 MG tablet; Take 2 pills today then 1 pill daily for 4 days  Dispense: 6 tablet; Refill: 0  3. Lumbar back pain with radiculopathy affecting right lower extremity She has been provided with refill of ibuprofen but was made aware that the prednisone that she has been taking can also cause stomach irritation.  Since her use of prednisone will likely provide some pain relief, she may wish to wait until she is finished prednisone before restarting use of ibuprofen.  She should make sure that she eats prior to taking prednisone as well as prior to taking ibuprofen and report any issues with stomach upset/stomach pain and seek emergency department follow-up for any blood in the stool or black stools. - ibuprofen (ADVIL) 600 MG tablet; Take 1 tablet (600 mg total) by mouth every 8 (eight) hours as needed  for moderate pain. Take after eating  Dispense: 90 tablet; Refill: 1   Follow Up Instructions:Return for Asthma/productive cough; 1 to 2 weeks; ED if symptoms worsen.    I discussed the assessment and treatment plan with the patient. The patient was provided an opportunity to ask questions and all were answered. The patient agreed with the plan and demonstrated an understanding of the instructions.   The patient was advised to call back or seek an in-person evaluation if the symptoms worsen or if the condition fails to improve as anticipated.  I provided 12 minutes of non-face-to-face time during this encounter.  An additional 10 minutes was spent on review of recent emergency department visits, labs, imaging and review of recent telemedicine visit as well as completion of today's note.   Antony Blackbird, MD

## 2020-01-27 MED FILL — ?ATORVASTATIN 40MG TABLET: 40 | 30 days supply | Qty: 30 | Fill #1

## 2020-01-27 MED FILL — ?ALLOPURINOL 100MG TABLET: 100 | 30 days supply | Qty: 30 | Fill #4

## 2020-01-27 MED FILL — AMLODIPINE BESYLATE 10 MG T: 10 | 30 days supply | Qty: 30 | Fill #2

## 2020-01-27 MED FILL — ACYCLOVIR 200 MG CAPSULE: 200 | 30 days supply | Qty: 60 | Fill #4

## 2020-01-27 MED FILL — ?HYDROCHLOROTHIAZIDE 25MG T: 25 | 30 days supply | Qty: 30 | Fill #8

## 2020-02-02 ENCOUNTER — Other Ambulatory Visit: Payer: Self-pay

## 2020-02-02 ENCOUNTER — Ambulatory Visit: Payer: Self-pay | Attending: Family Medicine | Admitting: Physician Assistant

## 2020-02-02 VITALS — BP 130/87 | HR 65 | Temp 97.5°F | Resp 16 | Wt 261.0 lb

## 2020-02-02 DIAGNOSIS — Z09 Encounter for follow-up examination after completed treatment for conditions other than malignant neoplasm: Secondary | ICD-10-CM

## 2020-02-02 DIAGNOSIS — E876 Hypokalemia: Secondary | ICD-10-CM

## 2020-02-02 DIAGNOSIS — M5416 Radiculopathy, lumbar region: Secondary | ICD-10-CM

## 2020-02-02 DIAGNOSIS — E119 Type 2 diabetes mellitus without complications: Secondary | ICD-10-CM

## 2020-02-02 DIAGNOSIS — T7840XD Allergy, unspecified, subsequent encounter: Secondary | ICD-10-CM

## 2020-02-02 LAB — POCT GLYCOSYLATED HEMOGLOBIN (HGB A1C): HbA1c, POC (controlled diabetic range): 6.5 % (ref 0.0–7.0)

## 2020-02-02 LAB — GLUCOSE, POCT (MANUAL RESULT ENTRY): POC Glucose: 111 mg/dl — AB (ref 70–99)

## 2020-02-02 MED ORDER — CETIRIZINE HCL 10 MG PO TABS: 10.0000 mg | ORAL_TABLET | Freq: Every day | ORAL | 11 refills | Status: DC

## 2020-02-02 MED ORDER — TRAMADOL HCL 50 MG PO TABS: 50.0000 mg | ORAL_TABLET | Freq: Two times a day (BID) | ORAL | 1 refills | Status: DC

## 2020-02-02 MED FILL — traMADol HCL 50 MG TABS: 50 | 30 days supply | Qty: 60 | Fill #1

## 2020-02-02 MED FILL — ?CETIRIZINE HCL 10 MG TABLE: 10 | 30 days supply | Qty: 30 | Fill #0

## 2020-02-02 NOTE — Progress Notes (Signed)
Sydney Williams, is a 54 y.o. female  [id number]  [id number]  DOB - December 11, 1965  Subjective:  Chief Complaint and HPI: Sydney Williams is a 54 y.o. female here today for a follow up visit after being seen in the ED and treated for bronchitis 01/15/2020.  She is feeling much better from that.  Needs RF on tramadol.  No pain management contract.  Also having itching and puffy eyes in the morning the last few mornings.  No fever.  Appetite is good.     ROS:   Constitutional:  No f/c, No night sweats, No unexplained weight loss. EENT:  No vision changes, No blurry vision, No hearing changes. No mouth, throat, or ear problems.  Respiratory: No cough, No SOB Cardiac: No CP, no palpitations GI:  No abd pain, No N/V/D. GU: No Urinary s/sx Musculoskeletal: ongoing back pain Neuro: No headache, no dizziness, no motor weakness.  Skin: No rash Endocrine:  No polydipsia. No polyuria.  Psych: Denies SI/HI  No problems updated.  ALLERGIES: Allergies  Allergen Reactions  . Lisinopril Swelling  . Potassium Chloride Shortness Of Breath and Swelling    Tolerates IV KCl, reaction only to PO product  . Pneumococcal Vaccines Swelling and Other (See Comments)    Reaction:  Swelling at injection site  . Vicodin [Hydrocodone-Acetaminophen] Itching and Rash    PAST MEDICAL HISTORY: Past Medical History:  Diagnosis Date  . Asthma    2015 hospitaliation for asthma  . Diabetes mellitus    vs impaired fasting glucose  . Family history of breast cancer   . Fibroids 2010  . Genital herpes   . GERD (gastroesophageal reflux disease)   . Hyperlipidemia   . Hypertension   . Impaired fasting blood sugar   . Migraines   . Obesity   . Smoker     MEDICATIONS AT HOME: Prior to Admission medications   Medication Sig Start Date End Date Taking? Authorizing Provider  acyclovir (ZOVIRAX) 200 MG capsule Take 1 capsule (200 mg total) by mouth 2 (two) times daily. 08/04/19   Fulp, Cammie, MD  albuterol  (PROVENTIL HFA;VENTOLIN HFA) 108 (90 Base) MCG/ACT inhaler Inhale 1-2 puffs into the lungs every 6 (six) hours as needed for wheezing or shortness of breath. 02/03/18   Tysinger, Camelia Eng, PA-C  albuterol (PROVENTIL) (2.5 MG/3ML) 0.083% nebulizer solution Take 3 mLs (2.5 mg total) by nebulization every 4 (four) hours as needed for wheezing or shortness of breath. [id number]   Delora Fuel, MD  allopurinol (ZYLOPRIM) 100 MG tablet Take 1 tablet (100 mg total) by mouth daily. 10/01/19   Fulp, Cammie, MD  amLODipine (NORVASC) 10 MG tablet TAKE 1 TABLET (10 MG TOTAL) BY MOUTH DAILY. TO LOWER BLOOD PRESSURE 12/01/19   Fulp, Cammie, MD  aspirin (EQ ASPIRIN ADULT LOW DOSE) 81 MG EC tablet Take 1 tablet (81 mg total) by mouth daily. Swallow whole. 02/03/18   Tysinger, Camelia Eng, PA-C  atorvastatin (LIPITOR) 40 MG tablet TAKE 1 TABLET (40 MG TOTAL) BY MOUTH DAILY. 12/31/19   Fulp, Cammie, MD  baclofen (LIORESAL) 10 MG tablet TAKE 1-2 TABLETS (10-20 MG TOTAL) BY MOUTH 3 (THREE) TIMES DAILY AS NEEDED FOR MUSCLE SPASMS. 12/10/19   Hilts, Legrand Como, MD  buPROPion (WELLBUTRIN XL) 150 MG 24 hr tablet TAKE 1 TABLET BY MOUTH ONCE DAILY 12/11/18   Tysinger, Camelia Eng, PA-C  cetirizine (ZYRTEC) 10 MG tablet Take 1 tablet (10 mg total) by mouth daily. 02/02/20   Argentina Donovan, PA-C  diazepam (  VALIUM) 2 MG tablet Take 1 tablet (2 mg total) by mouth every 6 (six) hours as needed for muscle spasms. Patient not taking: Reported on 01/19/2020 09/25/19   Lacretia Leigh, MD  diphenhydrAMINE (BENADRYL) 25 MG tablet Take 1 tablet (25 mg total) by mouth every 6 (six) hours as needed. 01/09/20   Tedd Sias, PA  EPINEPHrine (EPIPEN 2-PAK) 0.3 mg/0.3 mL IJ SOAJ injection Inject 0.3 mLs (0.3 mg total) into the muscle once as needed for up to 1 dose (for severe allergic reaction). CAll 911 immediately if you have to use this medicine 12/16/19   Fulp, Cammie, MD  fluticasone (FLONASE) 50 MCG/ACT nasal spray Place 2 sprays into both nostrils daily.  01/09/20   Tedd Sias, PA  Fluticasone-Salmeterol (ADVAIR) 250-50 MCG/DOSE AEPB Inhale 1 puff into the lungs 2 (two) times daily. 02/03/18   Tysinger, Camelia Eng, PA-C  furosemide (LASIX) 20 MG tablet One pill once per day if needed for swelling 12/16/19   Fulp, Cammie, MD  gabapentin (NEURONTIN) 100 MG capsule 2 in morning, 2 in late afternoon and 1-2 at bedtime 12/16/19   Fulp, Cammie, MD  hydrochlorothiazide (HYDRODIURIL) 25 MG tablet Take 1 tablet (25 mg total) by mouth daily. To lower blood pressure 04/22/19   Fulp, Cammie, MD  ibuprofen (ADVIL) 600 MG tablet Take 1 tablet (600 mg total) by mouth every 8 (eight) hours as needed for moderate pain. Take after eating 01/19/20   Fulp, Cammie, MD  metFORMIN (GLUCOPHAGE) 500 MG tablet Take 1 tablet (500 mg total) by mouth 2 (two) times daily with a meal. 09/16/19   Fulp, Cammie, MD  metoprolol tartrate (LOPRESSOR) 50 MG tablet TAKE 1 TABLET (50 MG TOTAL) BY MOUTH 2 (TWO) TIMES DAILY. 01/14/20   Fulp, Cammie, MD  omeprazole (PRILOSEC) 40 MG capsule Take 1 capsule (40 mg total) by mouth 2 (two) times daily. To decrease stomach acid 09/16/19   Fulp, Cammie, MD  oxyCODONE-acetaminophen (PERCOCET/ROXICET) 5-325 MG tablet Take 1-2 tablets by mouth 2 (two) times daily as needed for severe pain. Patient not taking: Reported on 01/19/2020 12/20/19   Antony Blackbird, MD  predniSONE (DELTASONE) 20 MG tablet 2 pills once daily x5 days; take after eating 01/19/20   Fulp, Cammie, MD  traMADol (ULTRAM) 50 MG tablet Take 1 tablet (50 mg total) by mouth 2 (two) times daily. As needed for pain; 30 day supply 02/02/20   Argentina Donovan, PA-C  Vitamin D, Cholecalciferol, 25 MCG (1000 UT) TABS Take 1 tablet by mouth once daily 02/18/19   Tysinger, Camelia Eng, PA-C     Objective:  EXAM:   Vitals:   02/02/20 0933  BP: 130/87  Pulse: 65  Resp: 16  Temp: (!) 97.5 F (36.4 C)  SpO2: 98%  Weight: 261 lb (118.4 kg)    General appearance : A&OX3. NAD. Non-toxic-appearing HEENT:  Atraumatic and Normocephalic.  PERRLA. EOM intact.  B conjunctive with pale erythema and slight puffiness of lower lids(no edema) Chest/Lungs:  Breathing-non-labored, Good air entry bilaterally, breath sounds normal without rales, rhonchi, or wheezing  CVS: S1 S2 regular, no murmurs, gallops, rubs  Extremities: Bilateral Lower Ext shows no edema, both legs are warm to touch with = pulse throughout Neurology:  CN II-XII grossly intact, Non focal.   Psych:  TP linear. J/I WNL. Normal speech. Appropriate eye contact and affect.  Skin:  No Rash  Data Review Lab Results  Component Value Date   HGBA1C 6.5 02/02/2020   HGBA1C 6.8 (A)  09/16/2019   HGBA1C 6.1 (A) 04/22/2019     Assessment & Plan   1. Type 2 diabetes mellitus without complication, without long-term current use of insulin (HCC) Not at goal but improved-continue bid metformin and work on diet(she's been eating a lot of candy) - HgB A1c - Glucose (CBG) - Basic metabolic panel  2. Allergy, subsequent encounter - cetirizine (ZYRTEC) 10 MG tablet; Take 1 tablet (10 mg total) by mouth daily.  Dispense: 30 tablet; Refill: 11  3. Lumbar back pain with radiculopathy affecting right lower extremity CANCELLED THIS RX bc unknowingly she still had a RF from the last Rx Dr Chapman Fitch gave her - traMADol (ULTRAM) 50 MG tablet; Take 1 tablet (50 mg total) by mouth 2 (two) times daily. As needed for pain; 30 day supply  Dispense: 60 tablet; Refill: 1  4. Hypokalemia - Basic metabolic panel  5. Hospital follow-up-much improved  Patient have been counseled extensively about nutrition and exercise  Return in about 1 month (around 03/03/2020) for Dr Chapman Fitch;  pain management contract.  The patient was given clear instructions to go to ER or return to medical center if symptoms don't improve, worsen or new problems develop. The patient verbalized understanding. The patient was told to call to get lab results if they haven't heard anything in the next  week.     Sydney Caldron, PA-C [facility] and [facility], [place]   02/02/2020, 9:45 AMPatient ID: Sydney Williams, female   DOB: Oct 26, 1966, 54 y.o.   MRN: [id number]

## 2020-02-03 LAB — BASIC METABOLIC PANEL
BUN/Creatinine Ratio: 13 (ref 9–23)
BUN: 7 mg/dL (ref 6–24)
CO2: 25 mmol/L (ref 20–29)
Calcium: 9.3 mg/dL (ref 8.7–10.2)
Chloride: 102 mmol/L (ref 96–106)
Creatinine, Ser: 0.53 mg/dL — ABNORMAL LOW (ref 0.57–1.00)
GFR calc Af Amer: 124 mL/min/{1.73_m2} (ref >59)
GFR calc non Af Amer: 108 mL/min/{1.73_m2} (ref >59)
Glucose: 83 mg/dL (ref 65–99)
Potassium: 4 mmol/L (ref 3.5–5.2)
Sodium: 143 mmol/L (ref 134–144)

## 2020-02-16 MED FILL — ?METOPROLOL TART 50 MG TABL: 50 | 30 days supply | Qty: 60 | Fill #1

## 2020-02-28 ENCOUNTER — Other Ambulatory Visit: Payer: Self-pay | Admitting: Family Medicine

## 2020-02-28 DIAGNOSIS — I10 Essential (primary) hypertension: Secondary | ICD-10-CM

## 2020-02-28 MED FILL — ?ATORVASTATIN 40MG TABLET: 40 | 30 days supply | Qty: 30 | Fill #2

## 2020-02-28 MED FILL — ?ALLOPURINOL 100MG TABLET: 100 | 30 days supply | Qty: 30 | Fill #5

## 2020-02-28 MED FILL — HYDROCHLOROTHIAZIDE 25 MG T: 25 | 30 days supply | Qty: 30 | Fill #9

## 2020-02-28 MED FILL — ACYCLOVIR 200 MG CAPSULE: 200 | 30 days supply | Qty: 60 | Fill #5

## 2020-03-01 MED FILL — AMLODIPINE BESYLATE 10 MG T: 10 | 30 days supply | Qty: 30 | Fill #0

## 2020-03-03 ENCOUNTER — Ambulatory Visit: Payer: Self-pay | Attending: Family Medicine | Admitting: Family Medicine

## 2020-03-03 ENCOUNTER — Other Ambulatory Visit: Payer: Self-pay

## 2020-03-03 ENCOUNTER — Telehealth: Payer: Self-pay | Admitting: Licensed Clinical Social Worker

## 2020-03-03 ENCOUNTER — Encounter: Payer: Self-pay | Admitting: Family Medicine

## 2020-03-03 VITALS — BP 129/86 | HR 72 | Temp 97.9°F | Ht 71.0 in | Wt 265.0 lb

## 2020-03-03 DIAGNOSIS — E119 Type 2 diabetes mellitus without complications: Secondary | ICD-10-CM

## 2020-03-03 DIAGNOSIS — M65342 Trigger finger, left ring finger: Secondary | ICD-10-CM

## 2020-03-03 DIAGNOSIS — M48062 Spinal stenosis, lumbar region with neurogenic claudication: Secondary | ICD-10-CM

## 2020-03-03 DIAGNOSIS — F419 Anxiety disorder, unspecified: Secondary | ICD-10-CM

## 2020-03-03 DIAGNOSIS — F329 Major depressive disorder, single episode, unspecified: Secondary | ICD-10-CM

## 2020-03-03 DIAGNOSIS — M12811 Other specific arthropathies, not elsewhere classified, right shoulder: Secondary | ICD-10-CM

## 2020-03-03 DIAGNOSIS — R202 Paresthesia of skin: Secondary | ICD-10-CM

## 2020-03-03 DIAGNOSIS — R200 Anesthesia of skin: Secondary | ICD-10-CM

## 2020-03-03 DIAGNOSIS — Z789 Other specified health status: Secondary | ICD-10-CM

## 2020-03-03 DIAGNOSIS — M5416 Radiculopathy, lumbar region: Secondary | ICD-10-CM

## 2020-03-03 DIAGNOSIS — F32A Depression, unspecified: Secondary | ICD-10-CM

## 2020-03-03 MED ORDER — TRAMADOL HCL 50 MG PO TABS: ORAL_TABLET | ORAL | 3 refills | Status: DC

## 2020-03-03 MED ORDER — DULOXETINE HCL 30 MG PO CSDR: 30.0000 mg | DELAYED_RELEASE_CAPSULE | Freq: Two times a day (BID) | ORAL | 3 refills | Status: DC

## 2020-03-03 MED FILL — DULoxetine HCL 30 MG CPEP: 30 | 30 days supply | Qty: 60 | Fill #0

## 2020-03-03 MED FILL — traMADol HCL 50 MG TABS: 50 | 30 days supply | Qty: 90 | Fill #0

## 2020-03-03 NOTE — Progress Notes (Signed)
Established Patient Office Visit  Subjective:  Patient ID: Sydney Williams, female    DOB: 1966-10-04  Age: 54 y.o. MRN: [id number]  CC: f/u back pain-Burwell Bethel, MD  HPI Sydney Williams , 54 year old African-American female, who is status post 02/02/2020 visit with another provider in follow-up of ED visit for bronchitis/asthma exacerbation, refill of tramadol due to chronic low back pain with radiation, type 2 diabetes, allergic rhinitis and hypokalemia who is seen in follow-up as she was told to schedule appointment for controlled substance contract due to her use of tramadol.  Hemoglobin A1c at patient's last visit was 6.5 showing very good control of diabetes.  Potassium level was normal at 4.0 on recent BMP.       At today's visit, patient complains of continued issues with low back pain with radiation of pain down the left leg that stays always about an 8-10 on a 0-to-10 scale.  Back pain is stabbing and pain that radiates down her left leg is sharp and sometimes burning.  She has also noticed that she is having pain in her left shoulder as well as episodes of numbness and tingling in her left hand.  Also, sometimes when she bends her fingers, her left ring finger remains bent down/stuck and she has to pull her finger to straighten it out. she feels that all of her symptoms are on the left side of her body.  She continues to have issues with anxiety and depression as she is no longer able to perform her job.  She reports that her employer believes that the patient should be able to come in and do work that involves sitting.  Patient however states that she cannot stand for long periods or sit for long periods due to her back pain.  She has difficulty walking and bearing weight on her legs due to her back pain and at times she feels as if her left leg will give away.  If she tries to walk without the use of her crutches, she is walking in a bent over fashion secondary to pain.  She is only able to walk  upright if she uses her crutches and she believes that her use of crutches may be contributing to her current shoulder and left hand pain.         Upon questioning, left hand pain has been going on for more than a month and she does have episodes of increased sharp and shooting pain in her hands and fingertips at night which causes her to awaken and she feels as if she needs to shake her hand to make it feel better.  She reports she does have decreased ability to move her left arm above the shoulder area as this increases the pain.  She has been taking tramadol twice daily for her back pain but she reports that using tramadol as well as using ibuprofen only slightly decreases her pain but does make the pain more tolerable, reduces pain to about a 6 on a 0-to-10 scale.  She continues to have difficulty sleeping as she cannot find a comfortable sleep position.  She remains fatigued.  She also continues to have issues with swelling of her lower legs.  She did get her recent disability appointment but she states that the doctor hardly touched her and the visit was over very quickly.  As she has been unable to work, she has not had any income and has been evicted from her apartment as she was unable to  pay rent.  She is currently living with one of her daughters.  She reports that she has spoken to the social worker this office which has helped with her level of anxiety as she wishes to continue to follow-up with the Education officer, museum.  Patient denies any suicidal thoughts or ideations but feels that she is a burden on her family.  Past Medical History:  Diagnosis Date  . Asthma    2015 hospitaliation for asthma  . Diabetes mellitus    vs impaired fasting glucose  . Family history of breast cancer   . Fibroids 2010  . Genital herpes   . GERD (gastroesophageal reflux disease)   . Hyperlipidemia   . Hypertension   . Impaired fasting blood sugar   . Migraines   . Obesity   . Smoker     Past Surgical  History:  Procedure Laterality Date  . COLONOSCOPY  03/13/2016   Dr. Carlean Purl, normal, repeat 2027  . CYST EXCISION     left neck/postauricular region, benign  . LAPAROSCOPIC ABDOMINAL EXPLORATION     removal of ectopic preg  . LEFT HEART CATH AND CORONARY ANGIOGRAPHY N/A 06/23/2017   Procedure: LEFT HEART CATH AND CORONARY ANGIOGRAPHY;  Surgeon: Nelva Bush, MD;  Location: [facility];  Service: Cardiovascular;  Laterality: N/A;  . TUBAL LIGATION      Family History  Problem Relation Age of Onset  . Diabetes Mother   . Hypertension Mother   . Aneurysm Mother   . Stroke Mother   . Cancer Mother        cervical cancer  . Cancer Maternal Aunt        breast  . Cancer Cousin        breast/breast  . Heart disease Neg Hx   . Colon cancer Neg Hx     Social History   Socioeconomic History  . Marital status: Legally Separated    Spouse name: Not on file  . Number of children: Not on file  . Years of education: Not on file  . Highest education level: Not on file  Occupational History  . Not on file  Tobacco Use  . Smoking status: Current Every Day Smoker    Packs/day: 0.25    Years: 14.00    Pack years: 3.50    Types: Cigarettes  . Smokeless tobacco: Never Used  Substance and Sexual Activity  . Alcohol use: Yes    Comment: occ  . Drug use: No  . Sexual activity: Yes    Birth control/protection: Surgical  Other Topics Concern  . Not on file  Social History Narrative   Married, and her granddaughter lives with her, works as Quarry manager at [employer], exercise - activity at work, walking.  12/2016   Social Determinants of Health   Financial Resource Strain:   . Difficulty of Paying Living Expenses:   Food Insecurity:   . Worried About Charity fundraiser in the Last Year:   . Arboriculturist in the Last Year:   Transportation Needs:   . Film/video editor (Medical):   Marland Kitchen Lack of Transportation (Non-Medical):   Physical Activity:   . Days of Exercise per  Week:   . Minutes of Exercise per Session:   Stress:   . Feeling of Stress :   Social Connections:   . Frequency of Communication with Friends and Family:   . Frequency of Social Gatherings with Friends and Family:   . Attends Religious  Services:   . Active Member of Clubs or Organizations:   . Attends Archivist Meetings:   Marland Kitchen Marital Status:   Intimate Partner Violence:   . Fear of Current or Ex-Partner:   . Emotionally Abused:   Marland Kitchen Physically Abused:   . Sexually Abused:     Outpatient Medications Prior to Visit  Medication Sig Dispense Refill  . acyclovir (ZOVIRAX) 200 MG capsule Take 1 capsule (200 mg total) by mouth 2 (two) times daily. 180 capsule 3  . albuterol (PROVENTIL HFA;VENTOLIN HFA) 108 (90 Base) MCG/ACT inhaler Inhale 1-2 puffs into the lungs every 6 (six) hours as needed for wheezing or shortness of breath. 1 Inhaler 1  . albuterol (PROVENTIL) (2.5 MG/3ML) 0.083% nebulizer solution Take 3 mLs (2.5 mg total) by nebulization every 4 (four) hours as needed for wheezing or shortness of breath. 75 mL 0  . allopurinol (ZYLOPRIM) 100 MG tablet Take 1 tablet (100 mg total) by mouth daily. 30 tablet 6  . amLODipine (NORVASC) 10 MG tablet TAKE 1 TABLET (10 MG TOTAL) BY MOUTH DAILY. TO LOWER BLOOD PRESSURE 30 tablet 0  . aspirin (EQ ASPIRIN ADULT LOW DOSE) 81 MG EC tablet Take 1 tablet (81 mg total) by mouth daily. Swallow whole. 90 tablet 3  . atorvastatin (LIPITOR) 40 MG tablet TAKE 1 TABLET (40 MG TOTAL) BY MOUTH DAILY. 30 tablet 2  . baclofen (LIORESAL) 10 MG tablet TAKE 1-2 TABLETS (10-20 MG TOTAL) BY MOUTH 3 (THREE) TIMES DAILY AS NEEDED FOR MUSCLE SPASMS. 90 tablet 1  . buPROPion (WELLBUTRIN XL) 150 MG 24 hr tablet TAKE 1 TABLET BY MOUTH ONCE DAILY 90 tablet 0  . cetirizine (ZYRTEC) 10 MG tablet Take 1 tablet (10 mg total) by mouth daily. 30 tablet 11  . diazepam (VALIUM) 2 MG tablet Take 1 tablet (2 mg total) by mouth every 6 (six) hours as needed for muscle  spasms. (Patient not taking: Reported on 01/19/2020) 12 tablet 0  . diphenhydrAMINE (BENADRYL) 25 MG tablet Take 1 tablet (25 mg total) by mouth every 6 (six) hours as needed. 30 tablet 0  . EPINEPHrine (EPIPEN 2-PAK) 0.3 mg/0.3 mL IJ SOAJ injection Inject 0.3 mLs (0.3 mg total) into the muscle once as needed for up to 1 dose (for severe allergic reaction). CAll 911 immediately if you have to use this medicine 1 each 11  . fluticasone (FLONASE) 50 MCG/ACT nasal spray Place 2 sprays into both nostrils daily. 15.8 mL 1  . Fluticasone-Salmeterol (ADVAIR) 250-50 MCG/DOSE AEPB Inhale 1 puff into the lungs 2 (two) times daily. 60 each 11  . furosemide (LASIX) 20 MG tablet One pill once per day if needed for swelling 20 tablet 0  . gabapentin (NEURONTIN) 100 MG capsule 2 in morning, 2 in late afternoon and 1-2 at bedtime 180 capsule 3  . hydrochlorothiazide (HYDRODIURIL) 25 MG tablet Take 1 tablet (25 mg total) by mouth daily. To lower blood pressure 90 tablet 3  . ibuprofen (ADVIL) 600 MG tablet Take 1 tablet (600 mg total) by mouth every 8 (eight) hours as needed for moderate pain. Take after eating 90 tablet 1  . metFORMIN (GLUCOPHAGE) 500 MG tablet Take 1 tablet (500 mg total) by mouth 2 (two) times daily with a meal. 90 tablet 1  . metoprolol tartrate (LOPRESSOR) 50 MG tablet TAKE 1 TABLET (50 MG TOTAL) BY MOUTH 2 (TWO) TIMES DAILY. 60 tablet 2  . omeprazole (PRILOSEC) 40 MG capsule Take 1 capsule (40 mg total) by  mouth 2 (two) times daily. To decrease stomach acid 60 capsule 3  . oxyCODONE-acetaminophen (PERCOCET/ROXICET) 5-325 MG tablet Take 1-2 tablets by mouth 2 (two) times daily as needed for severe pain. (Patient not taking: Reported on 01/19/2020) 30 tablet 0  . predniSONE (DELTASONE) 20 MG tablet 2 pills once daily x5 days; take after eating 10 tablet 0  . traMADol (ULTRAM) 50 MG tablet Take 1 tablet (50 mg total) by mouth 2 (two) times daily. As needed for pain; 30 day supply 60 tablet 1  .  Vitamin D, Cholecalciferol, 25 MCG (1000 UT) TABS Take 1 tablet by mouth once daily 90 tablet 0   No facility-administered medications prior to visit.    Allergies  Allergen Reactions  . Lisinopril Swelling  . Potassium Chloride Shortness Of Breath and Swelling    Tolerates IV KCl, reaction only to PO product  . Pneumococcal Vaccines Swelling and Other (See Comments)    Reaction:  Swelling at injection site  . Vicodin [Hydrocodone-Acetaminophen] Itching and Rash    ROS Review of Systems  Constitutional: Positive for fatigue. Negative for chills and fever.  HENT: Negative for sore throat and trouble swallowing.   Respiratory: Negative for cough and shortness of breath.   Cardiovascular: Positive for leg swelling (chronic per patient). Negative for chest pain and palpitations.  Gastrointestinal: Negative for abdominal pain, blood in stool, constipation, diarrhea and nausea.  Endocrine: Negative for polydipsia, polyphagia and polyuria.  Genitourinary: Negative for dysuria and frequency.  Musculoskeletal: Positive for arthralgias, back pain, gait problem and myalgias.  Skin: Negative for rash and wound.  Neurological: Positive for numbness (left hand and left leg). Negative for dizziness and headaches.  Hematological: Negative for adenopathy. Does not bruise/bleed easily.  Psychiatric/Behavioral: Positive for dysphoric mood and sleep disturbance. Negative for self-injury and suicidal ideas. The patient is nervous/anxious.       Objective:    Physical Exam  Constitutional: She is oriented to person, place, and time. She appears well-developed and well-nourished.  WNWD obese female in NAD but appears fatigued. Patient is sitting on chair with crutches leaned against a nearby wall  Neck: No JVD present.  Cardiovascular: Normal rate and regular rhythm.  Bilateral non-pitting distal LE edema  Pulmonary/Chest: Effort normal and breath sounds normal.  Abdominal: Soft. There is no  abdominal tenderness. There is no rebound and no guarding.  Musculoskeletal:        General: Tenderness present. No edema.     Comments: Positive Tinel at left wrist. Positive empty can sign at left shoulder and tenderness and anterior and posterior lateral left shoulder. Left ring finger is not stuck at today's visit. Lumbosacral and left SI joint tenderness and positive left seated leg raise  Lymphadenopathy:    She has no cervical adenopathy.  Neurological: She is alert and oriented to person, place, and time.  Skin: Skin is warm and dry.  Psychiatric:  Patient became tearful when expressing that she has been unable to work and lost her apartment and is now living with one of her daughters  Nursing note and vitals reviewed.   LMP 06/29/2015 (Exact Date)  Wt Readings from Last 3 Encounters:  02/02/20 261 lb (118.4 kg)  01/15/20 243 lb (110.2 kg)  12/08/19 243 lb (110.2 kg)     Health Maintenance Due  Topic Date Due  . COVID-19 Vaccine (1) Never done  . FOOT EXAM  02/03/2019  . OPHTHALMOLOGY EXAM  02/03/2019  . MAMMOGRAM  02/12/2020  Lab Results  Component Value Date   TSH 1.21 09/11/2016   Lab Results  Component Value Date   WBC 10.6 (H) 01/15/2020   HGB 14.1 01/15/2020   HCT 44.3 01/15/2020   MCV 94.1 01/15/2020   PLT 318 01/15/2020   Lab Results  Component Value Date   NA 143 02/02/2020   K 4.0 02/02/2020   CO2 25 02/02/2020   GLUCOSE 83 02/02/2020   BUN 7 02/02/2020   CREATININE 0.53 (L) 02/02/2020   BILITOT 0.5 01/15/2020   ALKPHOS 94 01/15/2020   AST 16 01/15/2020   ALT 17 01/15/2020   PROT 8.1 01/15/2020   ALBUMIN 3.8 01/15/2020   CALCIUM 9.3 02/02/2020   ANIONGAP 10 01/15/2020   Lab Results  Component Value Date   CHOL 172 07/02/2019   Lab Results  Component Value Date   HDL 40 07/02/2019   Lab Results  Component Value Date   LDLCALC 79 07/02/2019   Lab Results  Component Value Date   TRIG 326 (H) 07/02/2019   Lab Results    Component Value Date   CHOLHDL 4.3 07/02/2019   Lab Results  Component Value Date   HGBA1C 6.5 02/02/2020      Assessment & Plan:  1. Lumbar back pain with radiculopathy affecting right lower extremity; lumbar spinal stenosis with neurogenic claudication Patient with continued issues with low back pain with radiation. Continue Ortho follow-up with Dr. Louanne Skye, who per note on 12/08/2019 recommended that patient have surgery due to lumbar DDD, spinal stenosis and neurogenic claudication. Refill of Tramadol provided so that she can take up to 3 times daly along with her current gabapentin and baclofen. Discussed addition of duloxetine to help with pain as well as her anxiety and depression to which she agreed to try. Controlled substance contract also discussed and reviewed with patient who agreed to sign and she was given original and copy made for chart.  - traMADol (ULTRAM) 50 MG tablet; One pill up to 3 times per day as needed for pain. 30 day supply  Dispense: 90 tablet; Refill: 3 - DULoxetine HCl 30 MG CSDR; Take 30 mg by mouth 2 (two) times daily.  Dispense: 60 capsule; Refill: 3  2. Rotator cuff arthropathy of left shoulder Ortho referral for further evaluation and treatment of suspected right rotator cuff tendinopathy - Ambulatory referral to Orthopedic Surgery  3. Numbness and tingling in left hand Positive Tinel on exam suggestive of CTS- carpal tunnel syndrome as the likely cause of the painful numbness and tingling in her left hand. She reports that she does have a home wrist brace that she wears but with minimal relief of pain. Consider otc NSAID's or otc diclofenac gel.  Avoid NSAID use if this worsens asthma. Recent creatinine was not elevated on most recent April labs.  Hand surgery referral placed for further evaluation and treatment. - Ambulatory referral to Hand Surgery  4. Trigger ring finger of left hand; Controlled type 2 DM Discussed that patient's ring finger is more  common in people like herself who have diabetes. She will be referred to hand surgery for further evaluation and treatment. Most recent Hgb A1c of 6.5 on  02/02/2020 - Ambulatory referral to Hand Surgery  5. Anxiety and depression Patient with anxiety and depression related to chronic pain which has caused her to be unable to work and has led to financial issues that have caused additional stress. Discussed medication that can help with mood as well as back pain and  she agreed to try duloxetine as well as follow-up with medical social worker. She is aware that she call as well as make sooner appt if any issues with the medication and seek ED follow-up if acte worsening of anxiety or depression or suicidal thoughts.  - DULoxetine HCl 30 MG CSDR; Take 30 mg by mouth 2 (two) times daily.  Dispense: 60 capsule; Refill: 3 - Ambulatory referral to Social Work  6. Need for follow-up by social worker Patient is being referred to social work to help with resources as patient with anxiety and depression due to back which has caused financial difficulty. - Ambulatory referral to Social Work   An After Visit Summary was printed and given to the patient.   Follow-up: Return in about 3 weeks (around 03/24/2020) for joint pain/mood;f/u 3-4 weeks and sooner if needed.   More than 35 minutes in face to face time spent in face to face time with the patient at today's visit obtaining and reviewing recent and ongoing medical issues, ROS, exam and assessment with discussion of treatment plan and answering of patient 's questions along with an additional 20 minutes for pre and post visit chart review, placement of labs and orders, narcotic contract and completion of today's visit note.    Antony Blackbird, MD

## 2020-03-03 NOTE — Telephone Encounter (Signed)
Call placed to patient regarding [facility] referral. LCSW left message requesting a return call.  

## 2020-03-03 NOTE — Progress Notes (Signed)
1 mo f/u  Feels like theres a wind in her ears  Tiger finger on her left ring finger and wrist pain that goes up her arm  Shoulder pain  Back pain

## 2020-03-13 ENCOUNTER — Other Ambulatory Visit: Payer: Self-pay | Admitting: Family Medicine

## 2020-03-13 MED FILL — ?METOPROLOL TART 50 MG TABL: 50 | 30 days supply | Qty: 60 | Fill #2

## 2020-03-13 MED FILL — BACLOFEN 10 MG TABLET: 10 | 15 days supply | Qty: 90 | Fill #0

## 2020-03-24 ENCOUNTER — Ambulatory Visit: Payer: Medicaid Other | Admitting: Family Medicine

## 2020-03-31 ENCOUNTER — Ambulatory Visit: Payer: Medicaid Other | Admitting: Family

## 2020-04-03 ENCOUNTER — Encounter: Payer: Self-pay | Admitting: Critical Care Medicine

## 2020-04-03 ENCOUNTER — Ambulatory Visit: Payer: Self-pay | Attending: Family Medicine | Admitting: Critical Care Medicine

## 2020-04-03 ENCOUNTER — Other Ambulatory Visit: Payer: Self-pay | Admitting: Critical Care Medicine

## 2020-04-03 ENCOUNTER — Other Ambulatory Visit: Payer: Self-pay

## 2020-04-03 ENCOUNTER — Other Ambulatory Visit: Payer: Self-pay | Admitting: Family Medicine

## 2020-04-03 VITALS — BP 114/73 | HR 71 | Ht 71.0 in | Wt 266.4 lb

## 2020-04-03 DIAGNOSIS — M5416 Radiculopathy, lumbar region: Secondary | ICD-10-CM | POA: Insufficient documentation

## 2020-04-03 DIAGNOSIS — I10 Essential (primary) hypertension: Secondary | ICD-10-CM | POA: Insufficient documentation

## 2020-04-03 DIAGNOSIS — J309 Allergic rhinitis, unspecified: Secondary | ICD-10-CM

## 2020-04-03 DIAGNOSIS — Z888 Allergy status to other drugs, medicaments and biological substances status: Secondary | ICD-10-CM | POA: Insufficient documentation

## 2020-04-03 DIAGNOSIS — G8929 Other chronic pain: Secondary | ICD-10-CM | POA: Insufficient documentation

## 2020-04-03 DIAGNOSIS — Z7952 Long term (current) use of systemic steroids: Secondary | ICD-10-CM | POA: Insufficient documentation

## 2020-04-03 DIAGNOSIS — E559 Vitamin D deficiency, unspecified: Secondary | ICD-10-CM | POA: Insufficient documentation

## 2020-04-03 DIAGNOSIS — Z7984 Long term (current) use of oral hypoglycemic drugs: Secondary | ICD-10-CM | POA: Insufficient documentation

## 2020-04-03 DIAGNOSIS — Z79899 Other long term (current) drug therapy: Secondary | ICD-10-CM | POA: Insufficient documentation

## 2020-04-03 DIAGNOSIS — E785 Hyperlipidemia, unspecified: Secondary | ICD-10-CM | POA: Insufficient documentation

## 2020-04-03 DIAGNOSIS — Z7951 Long term (current) use of inhaled steroids: Secondary | ICD-10-CM | POA: Insufficient documentation

## 2020-04-03 DIAGNOSIS — E669 Obesity, unspecified: Secondary | ICD-10-CM | POA: Insufficient documentation

## 2020-04-03 DIAGNOSIS — Z833 Family history of diabetes mellitus: Secondary | ICD-10-CM | POA: Insufficient documentation

## 2020-04-03 DIAGNOSIS — Z7982 Long term (current) use of aspirin: Secondary | ICD-10-CM | POA: Insufficient documentation

## 2020-04-03 DIAGNOSIS — A6000 Herpesviral infection of urogenital system, unspecified: Secondary | ICD-10-CM

## 2020-04-03 DIAGNOSIS — J4540 Moderate persistent asthma, uncomplicated: Secondary | ICD-10-CM | POA: Insufficient documentation

## 2020-04-03 DIAGNOSIS — E790 Hyperuricemia without signs of inflammatory arthritis and tophaceous disease: Secondary | ICD-10-CM

## 2020-04-03 DIAGNOSIS — F17210 Nicotine dependence, cigarettes, uncomplicated: Secondary | ICD-10-CM | POA: Insufficient documentation

## 2020-04-03 DIAGNOSIS — A6009 Herpesviral infection of other urogenital tract: Secondary | ICD-10-CM | POA: Insufficient documentation

## 2020-04-03 DIAGNOSIS — Z885 Allergy status to narcotic agent status: Secondary | ICD-10-CM | POA: Insufficient documentation

## 2020-04-03 DIAGNOSIS — Z8249 Family history of ischemic heart disease and other diseases of the circulatory system: Secondary | ICD-10-CM | POA: Insufficient documentation

## 2020-04-03 DIAGNOSIS — E119 Type 2 diabetes mellitus without complications: Secondary | ICD-10-CM | POA: Insufficient documentation

## 2020-04-03 DIAGNOSIS — M48061 Spinal stenosis, lumbar region without neurogenic claudication: Secondary | ICD-10-CM | POA: Insufficient documentation

## 2020-04-03 MED ORDER — HYDROCHLOROTHIAZIDE 25 MG PO TABS: 25.0000 mg | ORAL_TABLET | Freq: Every day | ORAL | 3 refills | Status: DC

## 2020-04-03 MED ORDER — ACYCLOVIR 200 MG PO CAPS: 200.0000 mg | ORAL_CAPSULE | Freq: Two times a day (BID) | ORAL | 3 refills | Status: DC

## 2020-04-03 MED ORDER — AMLODIPINE BESYLATE 10 MG PO TABS: 10.0000 mg | ORAL_TABLET | Freq: Every day | ORAL | 0 refills | Status: DC

## 2020-04-03 MED ORDER — TRAMADOL HCL 50 MG PO TABS: ORAL_TABLET | ORAL | 3 refills | Status: DC

## 2020-04-03 MED ORDER — METOPROLOL TARTRATE 50 MG PO TABS: 50.0000 mg | ORAL_TABLET | Freq: Two times a day (BID) | ORAL | 2 refills | Status: DC

## 2020-04-03 MED ORDER — GABAPENTIN 100 MG PO CAPS: ORAL_CAPSULE | ORAL | 3 refills | Status: DC

## 2020-04-03 MED ORDER — ALBUTEROL SULFATE HFA 108 (90 BASE) MCG/ACT IN AERS: 1.0000 | INHALATION_SPRAY | Freq: Four times a day (QID) | RESPIRATORY_TRACT | 0 refills | Status: DC | PRN

## 2020-04-03 MED ORDER — BUPROPION HCL ER (XL) 150 MG PO TB24: 150.0000 mg | ORAL_TABLET | Freq: Every day | ORAL | 0 refills | Status: DC

## 2020-04-03 MED ORDER — ATORVASTATIN CALCIUM 40 MG PO TABS: 40.0000 mg | ORAL_TABLET | Freq: Every day | ORAL | 2 refills | Status: DC

## 2020-04-03 MED ORDER — ALLOPURINOL 100 MG PO TABS: 100.0000 mg | ORAL_TABLET | Freq: Every day | ORAL | 6 refills | Status: DC

## 2020-04-03 MED ORDER — FLUTICASONE-SALMETEROL 250-50 MCG/DOSE IN AEPB: 1.0000 | INHALATION_SPRAY | Freq: Two times a day (BID) | RESPIRATORY_TRACT | 11 refills | Status: DC

## 2020-04-03 MED FILL — ?BUPROPION HCL XL 150 MG TA: 150 | 30 days supply | Qty: 30 | Fill #0

## 2020-04-03 MED FILL — ?ALLOPURINOL 100MG TABLET: 100 | 30 days supply | Qty: 30 | Fill #6

## 2020-04-03 MED FILL — ?AMLODIPINE BESYL 10MG TABL: 10 | 30 days supply | Qty: 30 | Fill #0

## 2020-04-03 NOTE — Assessment & Plan Note (Signed)
History of vitamin D deficiency will refill vitamin D

## 2020-04-03 NOTE — Assessment & Plan Note (Signed)
Hypertension under good control we will continue amlodipine 10 mg daily, metoprolol 50 mg twice daily

## 2020-04-03 NOTE — Assessment & Plan Note (Signed)
Recurrent genital herpes have refilled the acyclovir

## 2020-04-03 NOTE — Assessment & Plan Note (Addendum)
Severe spinal stenosis in the lumbar area with radiculopathy which ultimately will require surgery per orthopedics Dr. 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

## 2020-04-03 NOTE — Assessment & Plan Note (Signed)
History of elevated uric acid level we will continue allopurinol

## 2020-04-03 NOTE — Patient Instructions (Signed)
You will need to reapply for your financial discount and see Clifton James again  Refills on all medications sent to our pharmacy  Use the walker instead of crutches  Follow up with orthopedics for back surgery to be scheduled once financial assistance goes through  Return to see Dr Joya Gaskins in 2 months

## 2020-04-03 NOTE — Progress Notes (Signed)
Subjective:    Patient ID: Sydney Williams, female    DOB: 26-Sep-1966, 54 y.o.   MRN: [id number]  54 y.o.F hx HTN asthma obesity, smoker, HL  This is a follow-up primary care visit and actually patient was seen previously in May for a post ER visit for left shoulder and hand pain.  It turns out this patient's had chronic low back pain and has been to orthopedics and has had MRIs done has been offered surgery but she is awaiting financial assistance as she does not have any active insurance at this time.  Note this patient's been using a set of crutches since November because of her back.  She noted more recently the left axilla was becoming more irritated causing numbness in the upper arm all the way down to the hand with pain in the hand itself.  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 Asthma    2015 hospitaliation for asthma  . Diabetes mellitus    vs impaired fasting glucose  . Family history of breast cancer   . Fibroids 2010  . Genital herpes   . GERD (gastroesophageal reflux disease)   . Hyperlipidemia   . Hypertension   . Impaired fasting blood sugar   . Migraines   . Obesity   . Smoker      Family History  Problem Relation Age of Onset  . Diabetes Mother   . Hypertension Mother   . Aneurysm Mother   . Stroke Mother   . Cancer Mother        cervical cancer  . Cancer Maternal Aunt        breast  . Cancer Cousin        breast/breast  . Heart disease Neg Hx   . Colon cancer Neg Hx      Social History   Socioeconomic History  . Marital status: Legally Separated    Spouse name: Not on file  . Number of children: Not on file  . Years of education: Not on file  . Highest education level: Not on file  Occupational History   . Not on file  Tobacco Use  . Smoking status: Current Every Day Smoker    Packs/day: 0.25    Years: 14.00    Pack years: 3.50    Types: Cigarettes  . Smokeless tobacco: Never Used  Substance and Sexual Activity  . Alcohol use: Yes    Comment: occ  . Drug use: No  . Sexual activity: Yes    Birth control/protection: Surgical  Other Topics Concern  . Not on file  Social History Narrative   Married, and her granddaughter lives with her, works as Quarry manager at [employer], exercise - activity at work, walking.  12/2016   Social Determinants of Health   Financial Resource Strain:   . Difficulty of Paying Living Expenses:   Food Insecurity:   . Worried About Charity fundraiser in the Last Year:   . Arboriculturist in the Last Year:   Transportation Needs:   . Film/video editor (Medical):   Marland Kitchen Lack of Transportation (Non-Medical):   Physical Activity:   . Days of Exercise per Week:   .  Minutes of Exercise per Session:   Stress:   . Feeling of Stress :   Social Connections:   . Frequency of Communication with Friends and Family:   . Frequency of Social Gatherings with Friends and Family:   . Attends Religious Services:   . Active Member of Clubs or Organizations:   . Attends Archivist Meetings:   Marland Kitchen Marital Status:   Intimate Partner Violence:   . Fear of Current or Ex-Partner:   . Emotionally Abused:   Marland Kitchen Physically Abused:   . Sexually Abused:      Allergies  Allergen Reactions  . Lisinopril Swelling  . Potassium Chloride Shortness Of Breath and Swelling    Tolerates IV KCl, reaction only to PO product  . Pneumococcal Vaccines Swelling and Other (See Comments)    Reaction:  Swelling at injection site  . Vicodin [Hydrocodone-Acetaminophen] Itching and Rash     Outpatient Medications Prior to Visit  Medication Sig Dispense Refill  . albuterol (PROVENTIL) (2.5 MG/3ML) 0.083% nebulizer solution Take 3 mLs (2.5 mg total) by nebulization every 4 (four) hours as  needed for wheezing or shortness of breath. 75 mL 0  . aspirin (EQ ASPIRIN ADULT LOW DOSE) 81 MG EC tablet Take 1 tablet (81 mg total) by mouth daily. Swallow whole. 90 tablet 3  . baclofen (LIORESAL) 10 MG tablet TAKE 1-2 TABLETS (10-20 MG TOTAL) BY MOUTH 3 (THREE) TIMES DAILY AS NEEDED FOR MUSCLE SPASMS. 90 tablet 1  . cetirizine (ZYRTEC) 10 MG tablet Take 1 tablet (10 mg total) by mouth daily. 30 tablet 11  . diphenhydrAMINE (BENADRYL) 25 MG tablet Take 1 tablet (25 mg total) by mouth every 6 (six) hours as needed. 30 tablet 0  . DULoxetine HCl 30 MG CSDR Take 30 mg by mouth 2 (two) times daily. 60 capsule 3  . EPINEPHrine (EPIPEN 2-PAK) 0.3 mg/0.3 mL IJ SOAJ injection Inject 0.3 mLs (0.3 mg total) into the muscle once as needed for up to 1 dose (for severe allergic reaction). CAll 911 immediately if you have to use this medicine 1 each 11  . fluticasone (FLONASE) 50 MCG/ACT nasal spray Place 2 sprays into both nostrils daily. 15.8 mL 1  . furosemide (LASIX) 20 MG tablet One pill once per day if needed for swelling 20 tablet 0  . ibuprofen (ADVIL) 600 MG tablet Take 1 tablet (600 mg total) by mouth every 8 (eight) hours as needed for moderate pain. Take after eating 90 tablet 1  . metFORMIN (GLUCOPHAGE) 500 MG tablet Take 1 tablet (500 mg total) by mouth 2 (two) times daily with a meal. 90 tablet 1  . omeprazole (PRILOSEC) 40 MG capsule Take 1 capsule (40 mg total) by mouth 2 (two) times daily. To decrease stomach acid 60 capsule 3  . Vitamin D, Cholecalciferol, 25 MCG (1000 UT) TABS Take 1 tablet by mouth once daily 90 tablet 0  . acyclovir (ZOVIRAX) 200 MG capsule Take 1 capsule (200 mg total) by mouth 2 (two) times daily. 180 capsule 3  . albuterol (PROVENTIL HFA;VENTOLIN HFA) 108 (90 Base) MCG/ACT inhaler Inhale 1-2 puffs into the lungs every 6 (six) hours as needed for wheezing or shortness of breath. 1 Inhaler 1  . allopurinol (ZYLOPRIM) 100 MG tablet Take 1 tablet (100 mg total) by mouth  daily. 30 tablet 6  . amLODipine (NORVASC) 10 MG tablet TAKE 1 TABLET (10 MG TOTAL) BY MOUTH DAILY. TO LOWER BLOOD PRESSURE 30 tablet 0  . atorvastatin (LIPITOR)  40 MG tablet TAKE 1 TABLET (40 MG TOTAL) BY MOUTH DAILY. 30 tablet 2  . buPROPion (WELLBUTRIN XL) 150 MG 24 hr tablet TAKE 1 TABLET BY MOUTH ONCE DAILY 90 tablet 0  . Fluticasone-Salmeterol (ADVAIR) 250-50 MCG/DOSE AEPB Inhale 1 puff into the lungs 2 (two) times daily. 60 each 11  . gabapentin (NEURONTIN) 100 MG capsule 2 in morning, 2 in late afternoon and 1-2 at bedtime 180 capsule 3  . hydrochlorothiazide (HYDRODIURIL) 25 MG tablet Take 1 tablet (25 mg total) by mouth daily. To lower blood pressure 90 tablet 3  . metoprolol tartrate (LOPRESSOR) 50 MG tablet TAKE 1 TABLET (50 MG TOTAL) BY MOUTH 2 (TWO) TIMES DAILY. 60 tablet 2  . predniSONE (DELTASONE) 20 MG tablet 2 pills once daily x5 days; take after eating 10 tablet 0  . traMADol (ULTRAM) 50 MG tablet One pill up to 3 times per day as needed for pain. 30 day supply 90 tablet 3  . diazepam (VALIUM) 2 MG tablet Take 1 tablet (2 mg total) by mouth every 6 (six) hours as needed for muscle spasms. (Patient not taking: Reported on 01/19/2020) 12 tablet 0  . oxyCODONE-acetaminophen (PERCOCET/ROXICET) 5-325 MG tablet Take 1-2 tablets by mouth 2 (two) times daily as needed for severe pain. (Patient not taking: Reported on 01/19/2020) 30 tablet 0   No facility-administered medications prior to visit.     Review of Systems  Constitutional: Positive for activity change.  HENT: Negative.   Respiratory: Negative.   Cardiovascular: Negative.   Gastrointestinal: Negative.   Musculoskeletal: Positive for back pain and gait problem.       R hand numb, pain under R axilla       Objective:   Physical Exam Vitals:   04/03/20 1126  BP: 114/73  Pulse: 71  SpO2: 98%  Weight: 266 lb 6.4 oz (120.8 kg)  Height: 5\' 11"  (1.803 m)    Gen: Pleasant, well-nourished, in no distress,  normal affect   ENT: No lesions,  mouth clear,  oropharynx clear, no postnasal drip  Neck: No JVD, no TMG, no carotid bruits  Lungs: No use of accessory muscles, no dullness to percussion, clear without rales or rhonchi  Cardiovascular: RRR, heart sounds normal, no murmur or gallops, no peripheral edema  Abdomen: soft and NT, no HSM,  BS normal  Musculoskeletal: No deformities, no cyanosis or clubbing  Neuro: alert, non focal, there is numbness in the left thumb and index finger area of the left hand there is tenderness under the left axilla from the brachial plexus distribution  Skin: Warm, no lesions or rashes   Prior MRI of the lumbar spine is reviewed       Assessment & Plan:  I personally reviewed all images and lab data in the [facility] system as well as any outside material available during this office visit and agree with the  radiology impressions.   Moderate persistent asthma Moderate persistent asthma stable this time we will refill the patient's albuterol and Advair inhalers  Rhinitis, allergic We will refill the Flonase  Lumbar radiculopathy, chronic Severe spinal stenosis in the lumbar area with radiculopathy which ultimately will require surgery per orthopedics Dr. Louanne Skye  Will inquire as to the status of the patient's financial assistance we can get her in for the surgery  I did check the [facility] drug database and found no frequent prescribers of opiates  I also obtain for this patient a walker so that she would not need to  use the crutches any longer which was exacerbating her brachial plexus on the left  I will prescribe tramadol as needed for pain and refill the gabapentin and the Cymbalta along with Wellbutrin  Elevated blood uric acid level History of elevated uric acid level we will continue allopurinol  Vitamin D deficiency History of vitamin D deficiency will refill vitamin D   Genital herpes Recurrent genital herpes have refilled the acyclovir  Essential  hypertension Hypertension under good control we will continue amlodipine 10 mg daily, metoprolol 50 mg twice daily   Merial was seen today for arm pain.  Diagnoses and all orders for this visit:  Lumbar radiculopathy, chronic -     gabapentin (NEURONTIN) 100 MG capsule; 2 in morning, 2 in late afternoon and 1-2 at bedtime  Genital herpes simplex, unspecified site -     acyclovir (ZOVIRAX) 200 MG capsule; Take 1 capsule (200 mg total) by mouth 2 (two) times daily.  Moderate persistent asthma without complication -     albuterol (VENTOLIN HFA) 108 (90 Base) MCG/ACT inhaler; Inhale 1-2 puffs into the lungs every 6 (six) hours as needed for wheezing or shortness of breath. -     Fluticasone-Salmeterol (ADVAIR) 250-50 MCG/DOSE AEPB; Inhale 1 puff into the lungs 2 (two) times daily.  Elevated blood uric acid level -     allopurinol (ZYLOPRIM) 100 MG tablet; Take 1 tablet (100 mg total) by mouth daily.  Essential hypertension -     amLODipine (NORVASC) 10 MG tablet; Take 1 tablet (10 mg total) by mouth daily. To lower blood pressure -     hydrochlorothiazide (HYDRODIURIL) 25 MG tablet; Take 1 tablet (25 mg total) by mouth daily. To lower blood pressure -     metoprolol tartrate (LOPRESSOR) 50 MG tablet; Take 1 tablet (50 mg total) by mouth 2 (two) times daily.  Lumbar back pain with radiculopathy affecting right lower extremity -     traMADol (ULTRAM) 50 MG tablet; One pill up to 3 times per day as needed for pain. 30 day supply  Allergic rhinitis, unspecified seasonality, unspecified trigger  Vitamin D deficiency  Other orders -     atorvastatin (LIPITOR) 40 MG tablet; Take 1 tablet (40 mg total) by mouth daily. -     buPROPion (WELLBUTRIN XL) 150 MG 24 hr tablet; Take 1 tablet (150 mg total) by mouth daily.

## 2020-04-03 NOTE — Assessment & Plan Note (Signed)
Moderate persistent asthma stable this time we will refill the patient's albuterol and Advair inhalers

## 2020-04-03 NOTE — Assessment & Plan Note (Signed)
We will refill the Flonase

## 2020-04-10 ENCOUNTER — Ambulatory Visit: Payer: Self-pay | Attending: Family Medicine

## 2020-04-10 ENCOUNTER — Other Ambulatory Visit: Payer: Self-pay

## 2020-04-17 MED FILL — BACLOFEN 10 MG TABLET: 10 | 15 days supply | Qty: 90 | Fill #1

## 2020-04-17 MED FILL — ?METOPROLOL TART 50 MG TABL: 50 | 30 days supply | Qty: 60 | Fill #0

## 2020-04-27 MED FILL — DULoxetine HCL 30 MG CPEP: 30 | 30 days supply | Qty: 60 | Fill #1

## 2020-04-28 MED FILL — IBUPROFEN 600 MG TABLET: 600 | 30 days supply | Qty: 90 | Fill #1

## 2020-05-03 ENCOUNTER — Other Ambulatory Visit: Payer: Self-pay | Admitting: Critical Care Medicine

## 2020-05-03 DIAGNOSIS — I10 Essential (primary) hypertension: Secondary | ICD-10-CM

## 2020-05-03 MED FILL — ?ATORVASTATIN 40MG TABLET: 40 | 30 days supply | Qty: 30 | Fill #1

## 2020-05-03 MED FILL — HYDROCHLOROTHIAZIDE 25 MG T: 25 | 30 days supply | Qty: 30 | Fill #0

## 2020-05-03 MED FILL — ?ALLOPURINOL 100MG TABLET: 100 | 30 days supply | Qty: 30 | Fill #0

## 2020-05-03 MED FILL — ?AMLODIPINE BESYL 10MG TABL: 10 | 30 days supply | Qty: 30 | Fill #0

## 2020-05-04 ENCOUNTER — Other Ambulatory Visit: Payer: Self-pay

## 2020-05-04 ENCOUNTER — Ambulatory Visit: Payer: Self-pay | Attending: Internal Medicine | Admitting: Family

## 2020-05-04 DIAGNOSIS — M5416 Radiculopathy, lumbar region: Secondary | ICD-10-CM

## 2020-05-04 NOTE — Progress Notes (Signed)
Virtual Visit via Telephone Note  I connected with Sydney Williams, on 05/04/2020 at 4:55 PM by telephone due to the COVID-19 pandemic and verified that I am speaking with the correct person using two identifiers.  Due to current restrictions/limitations of in-office visits due to the COVID-19 pandemic, this scheduled clinical appointment was converted to a telehealth visit.   Consent: I discussed the limitations, risks, security and privacy concerns of performing an evaluation and management service by telephone and the availability of in person appointments. I also discussed with the patient that there may be a patient responsible charge related to this service. The patient expressed understanding and agreed to proceed.  Location of Patient: Home  Location of Provider: [place] [facility] [place]   Persons participating in Telemedicine visit: Sydney Rauls, NP Orlan Leavens, [place]  History of Present Illness:  Patient ID: Sydney Williams, female    DOB: 12/21/1965  MRN: [id number]  CC: Back Pain  Subjective: Sydney Williams is a 54 y.o. female with history of essential hypertension, moderate persistent asthma, GERD, lumbar radiculopathy chronic, and hyperlipidemia who presents for back pain follow-up.  1. BACK PAIN FOLLOW-UP: Last visit 04/03/2020 with Dr. Joya Gaskins. During that encounter patient with severe spinal stenosis in the lumbar area with radiculopathy. This was determined to need surgery per Dr. Louanne Skye. Patient did not have health insurance at that time so surgery was postponed. 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   Today reports low back pain with muscle spasms is worse since last visit. Reports left hand is still swollen and left arm hurting. Reports she is still ambulating with walker and taking Tramadol, Gabapentin, Wellbutrin, and Cymbalta as prescribed for pain  management.  Reports she went to appointment with an orthopedic doctor about 2 months ago in [place], [place] on [address]. Reports she was told once she is approved for insurance that they will be able to see her. Reports since then she has received her [place] financial discount approval letter in the mail.   Past Medical History:  Diagnosis Date  . Asthma    2015 hospitaliation for asthma  . Diabetes mellitus    vs impaired fasting glucose  . Family history of breast cancer   . Fibroids 2010  . Genital herpes   . GERD (gastroesophageal reflux disease)   . Hyperlipidemia   . Hypertension   . Impaired fasting blood sugar   . Migraines   . Obesity   . Smoker    Allergies  Allergen Reactions  . Lisinopril Swelling  . Potassium Chloride Shortness Of Breath and Swelling    Tolerates IV KCl, reaction only to PO product  . Pneumococcal Vaccines Swelling and Other (See Comments)    Reaction:  Swelling at injection site  . Vicodin [Hydrocodone-Acetaminophen] Itching and Rash    Current Outpatient Medications on File Prior to Visit  Medication Sig Dispense Refill  . acyclovir (ZOVIRAX) 200 MG capsule Take 1 capsule (200 mg total) by mouth 2 (two) times daily. 180 capsule 3  . albuterol (PROVENTIL) (2.5 MG/3ML) 0.083% nebulizer solution Take 3 mLs (2.5 mg total) by nebulization every 4 (four) hours as needed for wheezing or shortness of breath. 75 mL 0  . albuterol (VENTOLIN HFA) 108 (90 Base) MCG/ACT inhaler Inhale 1-2 puffs into the lungs every 6 (six) hours as needed for wheezing or shortness of breath. [place]  g 0  . allopurinol (ZYLOPRIM) 100 MG tablet Take 1 tablet (100 mg total) by mouth daily. 30 tablet 6  . amLODipine (NORVASC) 10 MG tablet TAKE 1 TABLET (10 MG TOTAL) BY MOUTH DAILY. TO LOWER BLOOD PRESSURE 90 tablet 0  . aspirin (EQ ASPIRIN ADULT LOW DOSE) 81 MG EC tablet Take 1 tablet (81 mg total) by mouth daily. Swallow whole. 90 tablet 3  . atorvastatin (LIPITOR) 40 MG  tablet Take 1 tablet (40 mg total) by mouth daily. 30 tablet 2  . baclofen (LIORESAL) 10 MG tablet TAKE 1-2 TABLETS (10-20 MG TOTAL) BY MOUTH 3 (THREE) TIMES DAILY AS NEEDED FOR MUSCLE SPASMS. 90 tablet 1  . buPROPion (WELLBUTRIN XL) 150 MG 24 hr tablet Take 1 tablet (150 mg total) by mouth daily. 90 tablet 0  . cetirizine (ZYRTEC) 10 MG tablet Take 1 tablet (10 mg total) by mouth daily. 30 tablet 11  . diphenhydrAMINE (BENADRYL) 25 MG tablet Take 1 tablet (25 mg total) by mouth every 6 (six) hours as needed. 30 tablet 0  . DULoxetine HCl 30 MG CSDR Take 30 mg by mouth 2 (two) times daily. 60 capsule 3  . EPINEPHrine (EPIPEN 2-PAK) 0.3 mg/0.3 mL IJ SOAJ injection Inject 0.3 mLs (0.3 mg total) into the muscle once as needed for up to 1 dose (for severe allergic reaction). CAll 911 immediately if you have to use this medicine 1 each 11  . fluticasone (FLONASE) 50 MCG/ACT nasal spray Place 2 sprays into both nostrils daily. 15.8 mL 1  . Fluticasone-Salmeterol (ADVAIR) 250-50 MCG/DOSE AEPB Inhale 1 puff into the lungs 2 (two) times daily. 60 each 11  . furosemide (LASIX) 20 MG tablet One pill once per day if needed for swelling 20 tablet 0  . gabapentin (NEURONTIN) 100 MG capsule 2 in morning, 2 in late afternoon and 1-2 at bedtime 180 capsule 3  . hydrochlorothiazide (HYDRODIURIL) 25 MG tablet Take 1 tablet (25 mg total) by mouth daily. To lower blood pressure 90 tablet 3  . ibuprofen (ADVIL) 600 MG tablet Take 1 tablet (600 mg total) by mouth every 8 (eight) hours as needed for moderate pain. Take after eating 90 tablet 1  . metFORMIN (GLUCOPHAGE) 500 MG tablet Take 1 tablet (500 mg total) by mouth 2 (two) times daily with a meal. 90 tablet 1  . metoprolol tartrate (LOPRESSOR) 50 MG tablet Take 1 tablet (50 mg total) by mouth 2 (two) times daily. 60 tablet 2  . omeprazole (PRILOSEC) 40 MG capsule Take 1 capsule (40 mg total) by mouth 2 (two) times daily. To decrease stomach acid 60 capsule 3  .  traMADol (ULTRAM) 50 MG tablet One pill up to 3 times per day as needed for pain. 30 day supply 90 tablet 3  . Vitamin D, Cholecalciferol, 25 MCG (1000 UT) TABS Take 1 tablet by mouth once daily 90 tablet 0   No current facility-administered medications on file prior to visit.    Observations/Objective: Alert and oriented x 3. Not in acute distress. Physical examination not completed as this is a telemedicine visit.  Assessment and Plan: 1. Lumbar radiculopathy, chronic: -Patient has been approved for [facility] financial discount.  -Referral to Orthopedic Surgery for further evaluation and management. - Ambulatory referral to Orthopedic Surgery  Follow Up Instructions: Referral to Orthopedic Surgery   Patient was given clear instructions to go to Emergency Department or return to medical center if symptoms don't improve, worsen, or new problems develop.The patient verbalized  understanding.  I discussed the assessment and treatment plan with the patient. The patient was provided an opportunity to ask questions and all were answered. The patient agreed with the plan and demonstrated an understanding of the instructions.   The patient was advised to call back or seek an in-person evaluation if the symptoms worsen or if the condition fails to improve as anticipated.  I provided 10 minutes total of non-face-to-face time during this encounter including median intraservice time, reviewing previous notes, labs, imaging, medications, management and patient verbalized understanding.    Camillia Herter, NP  [facility] and [facility], [place]   05/04/2020, 4:55 PM

## 2020-05-05 NOTE — Patient Instructions (Signed)
Referral to Orthopedic Surgery. Radicular Pain Radicular pain is a type of pain that spreads from your back or neck along a spinal nerve. Spinal nerves are nerves that leave the spinal cord and go to the muscles. Radicular pain is sometimes called radiculopathy, radiculitis, or a pinched nerve. When you have this type of pain, you may also have weakness, numbness, or tingling in the area of your body that is supplied by the nerve. The pain may feel sharp and burning. Depending on which spinal nerve is affected, the pain may occur in the:  Neck area (cervical radicular pain). You may also feel pain, numbness, weakness, or tingling in the arms.  Mid-spine area (thoracic radicular pain). You would feel this pain in the back and chest. This type is rare.  Lower back area (lumbar radicular pain). You would feel this pain as low back pain. You may feel pain, numbness, weakness, or tingling in the buttocks or legs. Sciatica is a type of lumbar radicular pain that shoots down the back of the leg. Radicular pain occurs when one of the spinal nerves becomes irritated or squeezed (compressed). It is often caused by something pushing on a spinal nerve, such as one of the bones of the spine (vertebrae) or one of the round cushions between vertebrae (intervertebral disks). This can result from:  An injury.  Wear and tear or aging of a disk.  The growth of a bone spur that pushes on the nerve. Radicular pain often goes away when you follow instructions from your health care provider for relieving pain at home. Follow these instructions at home: Managing pain      If directed, put ice on the affected area: ? Put ice in a plastic bag. ? Place a towel between your skin and the bag. ? Leave the ice on for 20 minutes, 2-3 times a day.  If directed, apply heat to the affected area as often as told by your health care provider. Use the heat source that your health care provider recommends, such as a moist heat  pack or a heating pad. ? Place a towel between your skin and the heat source. ? Leave the heat on for 20-30 minutes. ? Remove the heat if your skin turns bright red. This is especially important if you are unable to feel pain, heat, or cold. You may have a greater risk of getting burned. Activity   Do not sit or rest in bed for long periods of time.  Try to stay as active as possible. Ask your health care provider what type of exercise or activity is best for you.  Avoid activities that make your pain worse, such as bending and lifting.  Do not lift anything that is heavier than 10 lb (4.5 kg), or the limit that you are told, until your health care provider says that it is safe.  Practice using proper technique when lifting items. Proper lifting technique involves bending your knees and rising up.  Do strength and range-of-motion exercises only as told by your health care provider or physical therapist. General instructions  Take over-the-counter and prescription medicines only as told by your health care provider.  Pay attention to any changes in your symptoms.  Keep all follow-up visits as told by your health care provider. This is important. ? Your health care provider may send you to a physical therapist to help with this pain. Contact a health care provider if:  Your pain and other symptoms get worse.  Your  pain medicine is not helping.  Your pain has not improved after a few weeks of home care.  You have a fever. Get help right away if:  You have severe pain, weakness, or numbness.  You have difficulty with bladder or bowel control. Summary  Radicular pain is a type of pain that spreads from your back or neck along a spinal nerve.  When you have radicular pain, you may also have weakness, numbness, or tingling in the area of your body that is supplied by the nerve.  The pain may feel sharp or burning.  Radicular pain may be treated with ice, heat, medicines, or  physical therapy. This information is not intended to replace advice given to you by your health care provider. Make sure you discuss any questions you have with your health care provider. Document Revised: 04/28/2018 Document Reviewed: 04/28/2018 Elsevier Patient Education  [place].

## 2020-05-08 ENCOUNTER — Encounter: Payer: Self-pay | Admitting: Specialist

## 2020-05-08 ENCOUNTER — Ambulatory Visit (INDEPENDENT_AMBULATORY_CARE_PROVIDER_SITE_OTHER): Payer: Self-pay | Admitting: Specialist

## 2020-05-08 ENCOUNTER — Other Ambulatory Visit: Payer: Self-pay

## 2020-05-08 VITALS — BP 128/91 | HR 72 | Ht 71.0 in | Wt 267.0 lb

## 2020-05-08 DIAGNOSIS — M5386 Other specified dorsopathies, lumbar region: Secondary | ICD-10-CM

## 2020-05-08 DIAGNOSIS — M5136 Other intervertebral disc degeneration, lumbar region: Secondary | ICD-10-CM

## 2020-05-08 DIAGNOSIS — M47816 Spondylosis without myelopathy or radiculopathy, lumbar region: Secondary | ICD-10-CM

## 2020-05-08 DIAGNOSIS — M7989 Other specified soft tissue disorders: Secondary | ICD-10-CM

## 2020-05-08 DIAGNOSIS — M48062 Spinal stenosis, lumbar region with neurogenic claudication: Secondary | ICD-10-CM

## 2020-05-08 DIAGNOSIS — M5441 Lumbago with sciatica, right side: Secondary | ICD-10-CM

## 2020-05-08 DIAGNOSIS — G8929 Other chronic pain: Secondary | ICD-10-CM

## 2020-05-08 DIAGNOSIS — M79604 Pain in right leg: Secondary | ICD-10-CM

## 2020-05-08 DIAGNOSIS — M5442 Lumbago with sciatica, left side: Secondary | ICD-10-CM

## 2020-05-08 NOTE — Patient Instructions (Signed)
Plan: Avoid bending, stooping and avoid lifting weights greater than 10 lbs. Avoid prolong standing and walking. Order for a new walker with wheels. Surgery scheduling secretary Kandice Hams, will call you in the next week to schedule for surgery.  Surgery recommended is a two level lumbar fusion L2-3, L3-4 and L4-5 this would be done with rods, screws and cages with local bone graft and allograft (donor bone graft). Take hydrocodone for for pain. Risk of surgery includes risk of infection 1 in 100 patients, bleeding 1/2% chance you would need a transfusion.   Risk to the nerves is one in 10,000. You will need to use a brace for 3 months and wean from the brace on the 4th month. Expect improved walking and standing tolerance. Expect relief of leg pain but numbness may persist depending on the length and degree of pressure that has been present. 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

## 2020-05-08 NOTE — Progress Notes (Signed)
Office Visit Note   Patient: Sydney Williams           Date of Birth: 05-Jan-1966           MRN: [id number] Visit Date: 05/08/2020              Requested by: Camillia Herter, NP [address] [place],  [place] [address] PCP: Antony Blackbird, MD   Assessment & Plan: Visit Diagnoses:  1. Chronic low back pain with bilateral sciatica, unspecified back pain laterality   2. DDD (degenerative disc disease), lumbar   3. Spinal stenosis of lumbar region with neurogenic claudication   4. Right leg swelling   5. Pain in right leg   6. Spondylosis of lumbar spine   7. Sciatica of left side associated with disorder of lumbar spine     Plan: Plan: Avoid bending, stooping and avoid lifting weights greater than 10 lbs. Avoid prolong standing and walking. Order for a new walker with wheels. Surgery scheduling secretary Kandice Hams, will call you in the next week to schedule for surgery.  Surgery recommended is a two level lumbar fusion L2-3, L3-4 and L4-5 this would be done with rods, screws and cages with local bone graft and allograft (donor bone graft). Take hydrocodone for for pain. Risk of surgery includes risk of infection 1 in 100 patients, bleeding 1/2% chance you would need a transfusion.   Risk to the nerves is one in 10,000. You will need to use a brace for 3 months and wean from the brace on the 4th month. Expect improved walking and standing tolerance. Expect relief of leg pain but numbness may persist depending on the length and degree of pressure that has been present. 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   Follow-Up Instructions: Return in about 4 weeks (around 06/05/2020).   Orders:  No orders of the defined types were placed in this encounter.  No orders of the defined types were placed in this encounter.     Procedures: No procedures performed   Clinical Data: Findings:  Narrative & Impression CLINICAL DATA:  Low back pain and left leg pain. Bilateral lower extremity weakness and swelling.  EXAM: MRI LUMBAR SPINE WITHOUT CONTRAST  TECHNIQUE: Multiplanar, multisequence MR imaging of the lumbar spine was performed. No intravenous contrast was administered.  COMPARISON:  Radiographs dated 05/18/2019 and lumbar MRI dated 09/27/2010  FINDINGS: Segmentation:  Standard.  Alignment:  Physiologic.  Vertebrae: No fracture, evidence of discitis, or bone lesion. Patchy low signal from the bone marrow, not as prominent as on the prior study. The patient is a smoker.  Conus medullaris and cauda equina: Conus extends to the L1-2 level. Conus and cauda equina appear normal.  Paraspinal and other soft tissues: Negative.  Disc levels:  T12-L1 and L1-2: Normal discs. Hypertrophy of the facet joints bilaterally at each level without significant progression.  L2-3: Disc desiccation with a small broad-based disc bulge and central annular tear, increased since the prior study. Increased hypertrophy of the facet joints and ligamentum flavum without neural impingement  or significant spinal stenosis.  L3-4: Progressive disc degeneration with progressive disc space narrowing. Small broad-based disc bulge with accompanying osteophytes extending into both neural foramina with a new right far lateral disc protrusion which touches the right L3 nerve lateral to the neural foramen but does not appear to compress it. Moderate right and mild left facet arthritis, essentially unchanged. No focal neural impingement. Small amount of fluid in the  right side of the disc space felt to be degenerative.  L4-5: Chronic marked disc space narrowing with extensive degenerative changes of the vertebral endplates, minimally progressed. Broad-based disc bulge with accompanying osteophytes extending into both neural foramina with symmetrical compression of the thecal sac and lateral recesses with chronic hypertrophy of the ligamentum flavum and facet joints, essentially unchanged. The L4 nerves appear to exit without impingement. Mild spinal stenosis at L4-5, unchanged.  L5-S1: Chronic disc desiccation with a tiny broad-based disc bulge with no neural impingement. Moderate bilateral facet arthritis. No neural impingement. No significant change since the prior study.  IMPRESSION: 1. Degenerative disc disease at L2-3 through L5-S1 without focal neural impingement. 2. Chronic degenerative disc disease at L4-5 with symmetrical compression of the thecal sac and lateral recesses without focal neural impingement. 3. Multilevel moderate facet arthritis without significant change since the prior study. 4. New right far lateral disc protrusion at L3-4 which touches the right L3 nerve lateral to the neural foramen but does not appear to compress it.   Electronically Signed   By: Lorriane Shire M.D.   On: 10/20/2019 16:20      Subjective: Chief Complaint  Patient presents with  . Lower Back - Follow-up    54 year old female with lumbar DDD, L2-3, L3-4 and L4-5 , mild and non impingment at L5-S1. She was last seen  12/08/2019 and advised to Consider lumbar fusion. Since then she has be able to apply for and obtain funding and [facility] has provided her with an orange card for her to obtain treatment for her back. 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 She has pain that is  9-=10 of 10 and night pain with pain with prolong standing and walking and with prolong sitting. Pain is mainly in the back  central L3 to sacrum and radiates into both buttocks and thighs. MRI with degenerative discs with congenital stenosis and right L3-4 foramenal disc protrusion and mild stenosis at L4-5. The disc narrowing causes borderline foramenal stenosis. She has difficulty with standing and walking and is unable to walk without stooping like an older woman. She has pain with stooping. Uses a walker with wheels and a seat.  Getting down on one knee she will need help getting up. No bowel or bladder difficulty. Can not walk one mile. She has to use a wlker to assist her in ambulation. She has been to PT and is taking tramadol, gabapentin, baclofen and ibuprofen.    Review of Systems  Constitutional: Negative.   HENT: Negative.   Eyes: Negative.   Respiratory: Negative.   Cardiovascular: Negative.   Gastrointestinal: Negative.   Endocrine: Negative.   Genitourinary: Negative.   Musculoskeletal: Negative.   Skin: Negative.   Allergic/Immunologic: Negative.   Neurological: Negative.   Hematological: Negative.   Psychiatric/Behavioral: Negative.      Objective: Vital Signs: BP (!) 128/91 (BP Location: Left Arm, Patient Position: Sitting)   Pulse 72   Ht 5\' 11"  (1.803 m)   Wt 267  lb (121.1 kg)   LMP 06/29/2015 (Exact Date)   BMI 37.24 kg/m   Physical Exam Musculoskeletal:     Lumbar back: Negative right straight leg raise test and negative left straight leg raise test.     Back Exam   Tenderness  The patient is experiencing tenderness in the lumbar.  Range of Motion  Extension:  80 abnormal  Flexion:  60 abnormal  Lateral bend right: 50  Lateral bend left: 60  Rotation right: 50  Rotation left: 50   Muscle Strength  Right Quadriceps:  4/5  Left Quadriceps:  4/5  Right Hamstrings:  5/5  Left Hamstrings:  5/5   Tests  Straight leg raise right: negative Straight leg raise left: negative  Reflexes  Patellar: 2/4 Achilles: 2/4 Babinski's sign: normal   Other  Toe walk:  abnormal Heel walk: abnormal Sensation: decreased Gait: abductor lurch  Erythema: back redness Scars: present      Specialty Comments:  No specialty comments available.  Imaging: No results found.   PMFS History: Patient Active Problem List   Diagnosis Date Noted  . Lumbar radiculopathy, chronic 04/03/2020  . Elevated blood uric acid level 04/03/2020  . Family history of cervical cancer 02/02/2018  . Family history of breast cancer 02/02/2018  . Vitamin D deficiency 02/02/2018  . Depression 06/19/2017  . Hypomagnesemia 06/19/2017  . 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 Snoring 09/11/2016  . History of migraine 09/11/2016  . Frequent headaches 09/11/2016  . Encounter for health maintenance examination in adult 12/04/2015  . Moderate persistent asthma 06/01/2015  . Gastroesophageal reflux disease without esophagitis 06/01/2015  . Rhinitis, allergic 06/01/2015  . Genital herpes 11/30/2014  . Smoker 11/30/2014  . Essential hypertension 11/30/2014  . Hyperlipidemia 11/30/2014  . Impaired fasting blood sugar 11/30/2014  . Obesity 07/02/2012   Past Medical History:  Diagnosis Date  . Asthma    2015 hospitaliation for asthma  . Diabetes mellitus    vs impaired fasting glucose  . Family history of breast cancer   . Fibroids 2010  . Genital herpes   . GERD (gastroesophageal reflux disease)   . Hyperlipidemia   . Hypertension   . Impaired fasting blood sugar   . Migraines   . Obesity   . Smoker     Family History  Problem Relation Age of Onset  . Diabetes Mother   . Hypertension Mother   . Aneurysm Mother   . Stroke Mother   . Cancer Mother        cervical cancer  . Cancer Maternal Aunt        breast  . Cancer Cousin        breast/breast  . Heart disease Neg Hx   . Colon cancer Neg Hx     Past Surgical History:  Procedure Laterality Date  . COLONOSCOPY  03/13/2016   Dr. Carlean Purl, normal, repeat 2027  . CYST EXCISION     left  neck/postauricular region, benign  . LAPAROSCOPIC ABDOMINAL EXPLORATION     removal of ectopic preg  . LEFT HEART CATH AND CORONARY ANGIOGRAPHY N/A 06/23/2017   Procedure: LEFT HEART CATH AND CORONARY ANGIOGRAPHY;  Surgeon: Nelva Bush, MD;  Location: [facility];  Service: Cardiovascular;  Laterality: N/A;  . TUBAL LIGATION     Social History   Occupational History  . Not on file  Tobacco Use  . Smoking status: Current Every Day Smoker    Packs/day: 0.25    Years: 14.00  Pack years: 3.50    Types: Cigarettes  . Smokeless tobacco: Never Used  Vaping Use  . Vaping Use: Never used  Substance and Sexual Activity  . Alcohol use: Yes    Comment: occ  . Drug use: No  . Sexual activity: Yes    Birth control/protection: Surgical

## 2020-05-11 ENCOUNTER — Institutional Professional Consult (permissible substitution): Payer: Medicaid Other | Admitting: Medical

## 2020-05-11 ENCOUNTER — Other Ambulatory Visit: Payer: Self-pay

## 2020-05-15 ENCOUNTER — Telehealth: Payer: Self-pay

## 2020-05-15 ENCOUNTER — Telehealth: Payer: Self-pay | Admitting: Family Medicine

## 2020-05-15 MED FILL — ?METOPROLOL TART 50 MG TABL: 50 | 30 days supply | Qty: 60 | Fill #1

## 2020-05-15 MED FILL — ?ACYCLOVIR 200 MG CAPSU: 200 | 30 days supply | Qty: 60 | Fill #7

## 2020-05-15 MED FILL — $ADVAIR 250/50 MCG INHALER: 250-50 | 90 days supply | Qty: 180 | Fill #1

## 2020-05-15 NOTE — Telephone Encounter (Signed)
Copied from [place] [id number]. Topic: General - Other >> May 15, 2020  2:45 PM Wynetta Emery, [place] C wrote: Reason for CRM: pt called in to request a referral to the dentist office that is on the back of her orange card. Pt say that she called them to schedule and was told that she need a referral first. Please assist.    CB: [phone]

## 2020-05-15 NOTE — Telephone Encounter (Signed)
Medical clearance was signed by MD Joya Gaskins and faxed today/ Confirmation received

## 2020-05-16 ENCOUNTER — Other Ambulatory Visit: Payer: Self-pay | Admitting: Internal Medicine

## 2020-05-16 DIAGNOSIS — K029 Dental caries, unspecified: Secondary | ICD-10-CM

## 2020-05-16 NOTE — Telephone Encounter (Signed)
Placed order for referral to dentist.   Phill Myron, D.O. Primary Care at [facility]  05/16/2020, 11:41 AM

## 2020-05-24 ENCOUNTER — Telehealth: Payer: Self-pay | Admitting: Medical

## 2020-05-24 NOTE — Telephone Encounter (Signed)
Adam, schedule preop or physical.  I received request for preop screen  [facility], hold onto preop form awaiting appointment

## 2020-05-25 NOTE — Telephone Encounter (Signed)
I called the pt. And she said that she has the orange medicaid card which we do not accept. I gave her the central [facility] number to see which office with in [facility] accept that insurance.

## 2020-05-26 ENCOUNTER — Telehealth: Payer: Self-pay | Admitting: Family Medicine

## 2020-05-26 NOTE — Telephone Encounter (Signed)
Patient is calling to state that she is needing surgical clearance for her heart because she will have back surgeon.  The back surgeon is Dr. Basil Dess [address] [place], [place]. [address] [phone] Please advise Cb- [phone]

## 2020-05-29 NOTE — Telephone Encounter (Signed)
She will need a preoperative clearance visit scheduled with a [facility] provider.   Phill Myron, D.O. Primary Care at [facility]  05/29/2020, 9:16 AM

## 2020-05-31 ENCOUNTER — Telehealth: Payer: Self-pay | Admitting: Family Medicine

## 2020-05-31 NOTE — Telephone Encounter (Signed)
Copied from [place] [id number]. Topic: General - Other >> May 31, 2020 11:58 AM Rainey [place] A wrote: Patient is requesting a status update on her heart surgical clearance. Please advise

## 2020-05-31 NOTE — Telephone Encounter (Signed)
Has an appt scheduled to see PCP/ made pt aware

## 2020-06-05 MED FILL — ?ATORVASTATIN 40MG TABLET: 40 | 30 days supply | Qty: 30 | Fill #2

## 2020-06-05 MED FILL — HYDROCHLOROTHIAZIDE 25 MG T: 25 | 30 days supply | Qty: 30 | Fill #1

## 2020-06-05 MED FILL — AMLODIPINE BESYLATE 10 MG T: 10 | 30 days supply | Qty: 30 | Fill #1

## 2020-06-05 MED FILL — ?ALLOPURINOL 100MG TABLET: 100 | 30 days supply | Qty: 30 | Fill #1

## 2020-06-06 ENCOUNTER — Other Ambulatory Visit: Payer: Self-pay | Admitting: Critical Care Medicine

## 2020-06-06 ENCOUNTER — Other Ambulatory Visit: Payer: Self-pay

## 2020-06-06 ENCOUNTER — Ambulatory Visit: Payer: Self-pay | Attending: Critical Care Medicine | Admitting: Critical Care Medicine

## 2020-06-06 ENCOUNTER — Encounter: Payer: Self-pay | Admitting: Critical Care Medicine

## 2020-06-06 VITALS — BP 126/87 | HR 82 | Temp 97.3°F | Resp 16 | Wt 274.0 lb

## 2020-06-06 DIAGNOSIS — Z1231 Encounter for screening mammogram for malignant neoplasm of breast: Secondary | ICD-10-CM

## 2020-06-06 DIAGNOSIS — E559 Vitamin D deficiency, unspecified: Secondary | ICD-10-CM | POA: Insufficient documentation

## 2020-06-06 DIAGNOSIS — G5603 Carpal tunnel syndrome, bilateral upper limbs: Secondary | ICD-10-CM | POA: Insufficient documentation

## 2020-06-06 DIAGNOSIS — Z7982 Long term (current) use of aspirin: Secondary | ICD-10-CM | POA: Insufficient documentation

## 2020-06-06 DIAGNOSIS — I10 Essential (primary) hypertension: Secondary | ICD-10-CM | POA: Insufficient documentation

## 2020-06-06 DIAGNOSIS — G8929 Other chronic pain: Secondary | ICD-10-CM | POA: Insufficient documentation

## 2020-06-06 DIAGNOSIS — E119 Type 2 diabetes mellitus without complications: Secondary | ICD-10-CM | POA: Insufficient documentation

## 2020-06-06 DIAGNOSIS — Z791 Long term (current) use of non-steroidal anti-inflammatories (NSAID): Secondary | ICD-10-CM | POA: Insufficient documentation

## 2020-06-06 DIAGNOSIS — J3089 Other allergic rhinitis: Secondary | ICD-10-CM

## 2020-06-06 DIAGNOSIS — F329 Major depressive disorder, single episode, unspecified: Secondary | ICD-10-CM | POA: Insufficient documentation

## 2020-06-06 DIAGNOSIS — F419 Anxiety disorder, unspecified: Secondary | ICD-10-CM | POA: Insufficient documentation

## 2020-06-06 DIAGNOSIS — E669 Obesity, unspecified: Secondary | ICD-10-CM | POA: Insufficient documentation

## 2020-06-06 DIAGNOSIS — F17200 Nicotine dependence, unspecified, uncomplicated: Secondary | ICD-10-CM

## 2020-06-06 DIAGNOSIS — E785 Hyperlipidemia, unspecified: Secondary | ICD-10-CM | POA: Insufficient documentation

## 2020-06-06 DIAGNOSIS — R7301 Impaired fasting glucose: Secondary | ICD-10-CM

## 2020-06-06 DIAGNOSIS — J4540 Moderate persistent asthma, uncomplicated: Secondary | ICD-10-CM | POA: Insufficient documentation

## 2020-06-06 DIAGNOSIS — Z833 Family history of diabetes mellitus: Secondary | ICD-10-CM | POA: Insufficient documentation

## 2020-06-06 DIAGNOSIS — Z7984 Long term (current) use of oral hypoglycemic drugs: Secondary | ICD-10-CM | POA: Insufficient documentation

## 2020-06-06 DIAGNOSIS — M5416 Radiculopathy, lumbar region: Secondary | ICD-10-CM | POA: Insufficient documentation

## 2020-06-06 DIAGNOSIS — Z01810 Encounter for preprocedural cardiovascular examination: Secondary | ICD-10-CM

## 2020-06-06 DIAGNOSIS — Z885 Allergy status to narcotic agent status: Secondary | ICD-10-CM | POA: Insufficient documentation

## 2020-06-06 DIAGNOSIS — F32A Depression, unspecified: Secondary | ICD-10-CM

## 2020-06-06 DIAGNOSIS — Z79899 Other long term (current) drug therapy: Secondary | ICD-10-CM | POA: Insufficient documentation

## 2020-06-06 DIAGNOSIS — F17210 Nicotine dependence, cigarettes, uncomplicated: Secondary | ICD-10-CM | POA: Insufficient documentation

## 2020-06-06 DIAGNOSIS — E790 Hyperuricemia without signs of inflammatory arthritis and tophaceous disease: Secondary | ICD-10-CM

## 2020-06-06 DIAGNOSIS — Z888 Allergy status to other drugs, medicaments and biological substances status: Secondary | ICD-10-CM | POA: Insufficient documentation

## 2020-06-06 DIAGNOSIS — Z7951 Long term (current) use of inhaled steroids: Secondary | ICD-10-CM | POA: Insufficient documentation

## 2020-06-06 DIAGNOSIS — R600 Localized edema: Secondary | ICD-10-CM | POA: Insufficient documentation

## 2020-06-06 LAB — GLUCOSE, POCT (MANUAL RESULT ENTRY): POC Glucose: 96 mg/dL (ref 70–99)

## 2020-06-06 MED ORDER — HYDROCHLOROTHIAZIDE 25 MG PO TABS: 25.0000 mg | ORAL_TABLET | Freq: Every day | ORAL | 3 refills | Status: DC

## 2020-06-06 MED ORDER — METOPROLOL TARTRATE 50 MG PO TABS: 50.0000 mg | ORAL_TABLET | Freq: Two times a day (BID) | ORAL | 2 refills | Status: DC

## 2020-06-06 MED ORDER — METFORMIN HCL 500 MG PO TABS: 500.0000 mg | ORAL_TABLET | Freq: Two times a day (BID) | ORAL | 1 refills | Status: DC

## 2020-06-06 MED ORDER — ATORVASTATIN CALCIUM 40 MG PO TABS: 40.0000 mg | ORAL_TABLET | Freq: Every day | ORAL | 2 refills | Status: DC

## 2020-06-06 MED ORDER — DULOXETINE HCL 30 MG PO CSDR: 30.0000 mg | DELAYED_RELEASE_CAPSULE | Freq: Two times a day (BID) | ORAL | 3 refills | Status: DC

## 2020-06-06 MED ORDER — FUROSEMIDE 20 MG PO TABS: ORAL_TABLET | ORAL | 0 refills | Status: DC

## 2020-06-06 MED ORDER — AMLODIPINE BESYLATE 10 MG PO TABS: 10.0000 mg | ORAL_TABLET | Freq: Every day | ORAL | 0 refills | Status: DC

## 2020-06-06 MED ORDER — BUPROPION HCL ER (XL) 150 MG PO TB24: 150.0000 mg | ORAL_TABLET | Freq: Every day | ORAL | 0 refills | Status: DC

## 2020-06-06 MED FILL — ?METFORMIN HCL 500MG TABL: 500 | 30 days supply | Qty: 60 | Fill #0

## 2020-06-06 MED FILL — ?BUPROPION HCL XL 150 MG TA: 150 | 30 days supply | Qty: 30 | Fill #0

## 2020-06-06 MED FILL — DULoxetine HCL 30 MG CPEP: 30 | 30 days supply | Qty: 60 | Fill #0

## 2020-06-06 MED FILL — ?FUROSEMIDE 20MG TABLET: 20 | 20 days supply | Qty: 20 | Fill #0

## 2020-06-06 NOTE — Assessment & Plan Note (Signed)
Patient still smoking 4 cigarettes daily encouraged her to completely quit in order to tolerate her surgery better

## 2020-06-06 NOTE — Assessment & Plan Note (Signed)
Moderate persistent asthma stable on long-acting dilator and steroid  I will refer her to allergy for second opinion

## 2020-06-06 NOTE — Assessment & Plan Note (Signed)
Continue Metformin daily hemoglobin A1c was 6.4

## 2020-06-06 NOTE — Patient Instructions (Signed)
EKG and lab today to clear you for back surgery  Referrals to orthopedics for your hand and allergy for asthma made  Refills on all medications sent  Mammogram ordered  Focus on smoking cessation   Return Dr Joya Gaskins 3 months in person

## 2020-06-06 NOTE — Progress Notes (Signed)
Subjective:    Patient ID: Sydney Williams, female    DOB: 08-Apr-1966, 54 y.o.   MRN: [id number]  54 y.o.F hx HTN asthma obesity, smoker, HL  This is a follow-up primary care visit and actually patient was seen previously in May for a post ER visit for left shoulder and hand pain.  It turns out this patient's had chronic low back pain and has been to orthopedics and has had MRIs done has been offered surgery but she is awaiting financial assistance as she does not have any active insurance at this time.  Note this patient's been using a set of crutches since November because of her back.  She noted more recently the left axilla was becoming more irritated causing numbness in the upper arm all the way down to the hand with pain in the hand itself.  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 Louanne Skye.  Patient comes in today for cardiac and medical clearance for planned surgery.  She does have moderate persistent asthma and history of diastolic cardiac dysfunction from severe hypertension.  She had a cardiac cath in 2018 showed no coronary abnormalities.  She history of elevated uric acid and is on allopurinol and vitamin D deficiency on vitamin D supplementation.  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  Hypertension has been very well controlled on beta-blocker, amlodipine,, And hydrochlorthiazide The patient also has moderate persistent asthma and is on Advair inhaler and has had no recent exacerbations.  She also maintains a baby aspirin daily.  Patient does have diabetes type 2  and is on the Metformin daily.  Recent hemoglobin A1c was 6.4  The patient's had no active issues of chest pain or significant shortness of breath.  He does complain of bilateral hand pain that is worsening.  He does not have a walker in order to ambulate with her back.  She is using Tylenol extra strength and baclofen to control her pain.  Tramadol was noted no use.  She cannot take oxycodone due to allergies.  Note she did receive her full 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  She is due up a mammogram.  She also would like an allergy referral in network for her asthma.  She continues to have dental issues and has in fact gone to dental care which have improved her issues and this was covered by her orange card.  Note on arrival blood glucose was 96    Past Medical History:  Diagnosis Date  . Asthma    2015 hospitaliation for asthma  . Diabetes mellitus    vs impaired fasting glucose  . Family history of breast cancer   . Fibroids 2010  . Genital herpes   . GERD (gastroesophageal reflux disease)   . Hyperlipidemia   . Hypertension   . Impaired fasting blood sugar   . Migraines   . Obesity   . Smoker      Family History  Problem Relation Age of Onset  . Diabetes Mother   . Hypertension Mother   .  Aneurysm Mother   . Stroke Mother   . Cancer Mother        cervical cancer  . Cancer Maternal Aunt        breast  . Cancer Cousin        breast/breast  . Heart disease Neg Hx   . Colon cancer Neg Hx      Social History   Socioeconomic History  . Marital status: Legally Separated    Spouse name: Not on file  . Number of children: Not on file  . Years of education: Not on file  . Highest education level: Not on file  Occupational History  . Not on file  Tobacco Use  . Smoking status: Current Every Day Smoker    Packs/day: 0.25    Years: 14.00    Pack years: 3.50    Types: Cigarettes  . Smokeless tobacco: Never Used  Vaping Use  . Vaping Use:  Never used  Substance and Sexual Activity  . Alcohol use: Yes    Comment: occ  . Drug use: No  . Sexual activity: Yes    Birth control/protection: Surgical  Other Topics Concern  . Not on file  Social History Narrative   Married, and her granddaughter lives with her, works as Quarry manager at [employer], exercise - activity at work, walking.  12/2016   Social Determinants of Health   Financial Resource Strain:   . Difficulty of Paying Living Expenses:   Food Insecurity:   . Worried About Charity fundraiser in the Last Year:   . Arboriculturist in the Last Year:   Transportation Needs:   . Film/video editor (Medical):   Marland Kitchen Lack of Transportation (Non-Medical):   Physical Activity:   . Days of Exercise per Week:   . Minutes of Exercise per Session:   Stress:   . Feeling of Stress :   Social Connections:   . Frequency of Communication with Friends and Family:   . Frequency of Social Gatherings with Friends and Family:   . Attends Religious Services:   . Active Member of Clubs or Organizations:   . Attends Archivist Meetings:   Marland Kitchen Marital Status:   Intimate Partner Violence:   . Fear of Current or Ex-Partner:   . Emotionally Abused:   Marland Kitchen Physically Abused:   . Sexually Abused:      Allergies  Allergen Reactions  . Lisinopril Swelling  . Potassium Chloride Shortness Of Breath and Swelling    Tolerates IV KCl, reaction only to PO product  . Pneumococcal Vaccines Swelling and Other (See Comments)    Reaction:  Swelling at injection site  . Vicodin [Hydrocodone-Acetaminophen] Itching and Rash     Outpatient Medications Prior to Visit  Medication Sig Dispense Refill  . acyclovir (ZOVIRAX) 200 MG capsule Take 1 capsule (200 mg total) by mouth 2 (two) times daily. 180 capsule 3  . albuterol (PROVENTIL) (2.5 MG/3ML) 0.083% nebulizer solution Take 3 mLs (2.5 mg total) by nebulization every 4 (four) hours as needed for wheezing or shortness of breath. 75 mL 0  .  albuterol (VENTOLIN HFA) 108 (90 Base) MCG/ACT inhaler Inhale 1-2 puffs into the lungs every 6 (six) hours as needed for wheezing or shortness of breath. 18 g 0  . allopurinol (ZYLOPRIM) 100 MG tablet Take 1 tablet (100 mg total) by mouth daily. 30 tablet 6  . aspirin (EQ ASPIRIN ADULT LOW DOSE) 81 MG EC tablet Take 1 tablet (81  mg total) by mouth daily. Swallow whole. 90 tablet 3  . baclofen (LIORESAL) 10 MG tablet TAKE 1-2 TABLETS (10-20 MG TOTAL) BY MOUTH 3 (THREE) TIMES DAILY AS NEEDED FOR MUSCLE SPASMS. 90 tablet 1  . cetirizine (ZYRTEC) 10 MG tablet Take 1 tablet (10 mg total) by mouth daily. 30 tablet 11  . diphenhydrAMINE (BENADRYL) 25 MG tablet Take 1 tablet (25 mg total) by mouth every 6 (six) hours as needed. 30 tablet 0  . EPINEPHrine (EPIPEN 2-PAK) 0.3 mg/0.3 mL IJ SOAJ injection Inject 0.3 mLs (0.3 mg total) into the muscle once as needed for up to 1 dose (for severe allergic reaction). CAll 911 immediately if you have to use this medicine 1 each 11  . fluticasone (FLONASE) 50 MCG/ACT nasal spray Place 2 sprays into both nostrils daily. 15.8 mL 1  . Fluticasone-Salmeterol (ADVAIR) 250-50 MCG/DOSE AEPB Inhale 1 puff into the lungs 2 (two) times daily. 60 each 11  . gabapentin (NEURONTIN) 100 MG capsule 2 in morning, 2 in late afternoon and 1-2 at bedtime 180 capsule 3  . ibuprofen (ADVIL) 600 MG tablet Take 1 tablet (600 mg total) by mouth every 8 (eight) hours as needed for moderate pain. Take after eating 90 tablet 1  . omeprazole (PRILOSEC) 40 MG capsule Take 1 capsule (40 mg total) by mouth 2 (two) times daily. To decrease stomach acid 60 capsule 3  . Vitamin D, Cholecalciferol, 25 MCG (1000 UT) TABS Take 1 tablet by mouth once daily 90 tablet 0  . amLODipine (NORVASC) 10 MG tablet TAKE 1 TABLET (10 MG TOTAL) BY MOUTH DAILY. TO LOWER BLOOD PRESSURE 90 tablet 0  . atorvastatin (LIPITOR) 40 MG tablet Take 1 tablet (40 mg total) by mouth daily. 30 tablet 2  . buPROPion (WELLBUTRIN XL)  150 MG 24 hr tablet Take 1 tablet (150 mg total) by mouth daily. 90 tablet 0  . DULoxetine HCl 30 MG CSDR Take 30 mg by mouth 2 (two) times daily. 60 capsule 3  . furosemide (LASIX) 20 MG tablet One pill once per day if needed for swelling 20 tablet 0  . hydrochlorothiazide (HYDRODIURIL) 25 MG tablet Take 1 tablet (25 mg total) by mouth daily. To lower blood pressure 90 tablet 3  . metFORMIN (GLUCOPHAGE) 500 MG tablet Take 1 tablet (500 mg total) by mouth 2 (two) times daily with a meal. 90 tablet 1  . metoprolol tartrate (LOPRESSOR) 50 MG tablet Take 1 tablet (50 mg total) by mouth 2 (two) times daily. 60 tablet 2  . traMADol (ULTRAM) 50 MG tablet One pill up to 3 times per day as needed for pain. 30 day supply (Patient not taking: Reported on 06/06/2020) 90 tablet 3   No facility-administered medications prior to visit.     Review of Systems  Constitutional: Positive for activity change.  HENT: Negative.   Respiratory: Negative.   Cardiovascular: Negative.   Gastrointestinal: Negative.   Musculoskeletal: Positive for back pain and gait problem.       R hand numb, pain under R axilla       Objective:   Physical Exam Vitals:   06/06/20 0929  BP: 126/87  Pulse: 82  Resp: 16  Temp: (!) 97.3 F (36.3 C)  SpO2: 97%  Weight: 274 lb (124.3 kg)    Gen: Pleasant, well-nourished, in no distress,  normal affect  ENT: No lesions,  mouth clear,  oropharynx clear, no postnasal drip  Neck: No JVD, no TMG, no carotid bruits  Lungs: No use of accessory muscles, no dullness to percussion, clear without rales or rhonchi  Cardiovascular: RRR, heart sounds normal, no murmur or gallops, no peripheral edema  Abdomen: soft and NT, no HSM,  BS normal  Musculoskeletal: No deformities, no cyanosis or clubbing  Neuro: alert, non focal, there is numbness in the left thumb and index finger area of the left hand there is tenderness under the left axilla from the brachial plexus  distribution  Skin: Warm, no lesions or rashes   Prior MRI of the lumbar spine is reviewed  EKG obtained today shows normal sinus rhythm no acute changes  CBG 96     Assessment & Plan:  I personally reviewed all images and lab data in the [facility] system as well as any outside material available during this office visit and agree with the  radiology impressions.   Lumbar radiculopathy, chronic Significant lumbar radiculopathy and the patient is medically cleared for planned surgical intervention  We will check a complete blood count metabolic panel today along with lipid panel and microalbumin.    EKG was normal at this visit  I do not believe cardiology evaluation is necessary  Elevated blood uric acid level Continue allopurinol  Impaired fasting blood sugar Continue Metformin daily hemoglobin A1c was 6.4  Smoker Patient still smoking 4 cigarettes daily encouraged her to completely quit in order to tolerate her surgery better  Moderate persistent asthma Moderate persistent asthma stable on long-acting dilator and steroid  I will refer her to allergy for second opinion  Essential hypertension Good control of blood pressure on current program no changes were made   Diagnoses and all orders for this visit:  Type 2 diabetes mellitus without complication, without long-term current use of insulin (HCC) -     POCT glucose (manual entry) -     metFORMIN (GLUCOPHAGE) 500 MG tablet; Take 1 tablet (500 mg total) by mouth 2 (two) times daily with a meal. -     Comprehensive metabolic panel -     CBC with Differential/Platelet -     Lipid panel -     Microalbumin / creatinine urine ratio  Preop cardiovascular exam -     EKG 12-Lead -     Comprehensive metabolic panel -     CBC with Differential/Platelet -     Lipid panel  Mild peripheral edema -     furosemide (LASIX) 20 MG tablet; One pill once per day if needed for swelling  Essential hypertension -     amLODipine  (NORVASC) 10 MG tablet; Take 1 tablet (10 mg total) by mouth daily. To lower blood pressure -     hydrochlorothiazide (HYDRODIURIL) 25 MG tablet; Take 1 tablet (25 mg total) by mouth daily. To lower blood pressure -     metoprolol tartrate (LOPRESSOR) 50 MG tablet; Take 1 tablet (50 mg total) by mouth 2 (two) times daily.  Lumbar back pain with radiculopathy affecting right lower extremity -     DULoxetine HCl 30 MG CSDR; Take 30 mg by mouth 2 (two) times daily.  Anxiety and depression -     DULoxetine HCl 30 MG CSDR; Take 30 mg by mouth 2 (two) times daily.  Impaired fasting blood sugar -     metFORMIN (GLUCOPHAGE) 500 MG tablet; Take 1 tablet (500 mg total) by mouth 2 (two) times daily with a meal.  Encounter for screening mammogram for malignant neoplasm of breast -     MM DIGITAL SCREENING BILATERAL;  Future  Carpal tunnel syndrome on both sides -     Ambulatory referral to Orthopedic Surgery  Moderate persistent asthma without complication -     Ambulatory referral to Allergy  Non-seasonal allergic rhinitis due to other allergic trigger -     Ambulatory referral to Allergy  Lumbar radiculopathy, chronic  Elevated blood uric acid level  Smoker  Other orders -     buPROPion (WELLBUTRIN XL) 150 MG 24 hr tablet; Take 1 tablet (150 mg total) by mouth daily. -     atorvastatin (LIPITOR) 40 MG tablet; Take 1 tablet (40 mg total) by mouth daily.

## 2020-06-06 NOTE — Assessment & Plan Note (Signed)
Continue allopurinol 

## 2020-06-06 NOTE — Assessment & Plan Note (Signed)
Good control of blood pressure on current program no changes were made

## 2020-06-06 NOTE — Assessment & Plan Note (Signed)
Significant lumbar radiculopathy and the patient is medically cleared for planned surgical intervention  We will check a complete blood count metabolic panel today along with lipid panel and microalbumin.    EKG was normal at this visit  I do not believe cardiology evaluation is necessary

## 2020-06-06 NOTE — Progress Notes (Signed)
Here for medical clearance for back surgery /No date of surgery scheduled

## 2020-06-07 ENCOUNTER — Telehealth: Payer: Self-pay

## 2020-06-07 LAB — LIPID PANEL
Chol/HDL Ratio: 4.2 ratio (ref 0.0–4.4)
Cholesterol, Total: 147 mg/dL (ref 100–199)
HDL: 35 mg/dL — ABNORMAL LOW (ref >39)
LDL Chol Calc (NIH): 68 mg/dL (ref 0–99)
Triglycerides: 273 mg/dL — ABNORMAL HIGH (ref 0–149)
VLDL Cholesterol Cal: 44 mg/dL — ABNORMAL HIGH (ref 5–40)

## 2020-06-07 LAB — CBC WITH DIFFERENTIAL/PLATELET
Basophils Absolute: 0 10*3/uL (ref 0.0–0.2)
Basos: 1 %
EOS (ABSOLUTE): 0.2 10*3/uL (ref 0.0–0.4)
Eos: 4 %
Hematocrit: 40.4 % (ref 34.0–46.6)
Hemoglobin: 13.7 g/dL (ref 11.1–15.9)
Immature Grans (Abs): 0 10*3/uL (ref 0.0–0.1)
Immature Granulocytes: 0 %
Lymphocytes Absolute: 2.1 10*3/uL (ref 0.7–3.1)
Lymphs: 35 %
MCH: 31.3 pg (ref 26.6–33.0)
MCHC: 33.9 g/dL (ref 31.5–35.7)
MCV: 92 fL (ref 79–97)
Monocytes Absolute: 0.7 10*3/uL (ref 0.1–0.9)
Monocytes: 12 %
Neutrophils Absolute: 2.9 10*3/uL (ref 1.4–7.0)
Neutrophils: 48 %
Platelets: 258 10*3/uL (ref 150–450)
RBC: 4.38 x10E6/uL (ref 3.77–5.28)
RDW: 13.2 % (ref 11.7–15.4)
WBC: 6 10*3/uL (ref 3.4–10.8)

## 2020-06-07 LAB — COMPREHENSIVE METABOLIC PANEL
ALT: 23 IU/L (ref 0–32)
AST: 21 IU/L (ref 0–40)
Albumin/Globulin Ratio: 1.1 — ABNORMAL LOW (ref 1.2–2.2)
Albumin: 4.1 g/dL (ref 3.8–4.9)
Alkaline Phosphatase: 125 IU/L — ABNORMAL HIGH (ref 48–121)
BUN/Creatinine Ratio: 16 (ref 9–23)
BUN: 11 mg/dL (ref 6–24)
Bilirubin Total: 0.2 mg/dL (ref 0.0–1.2)
CO2: 23 mmol/L (ref 20–29)
Calcium: 9.4 mg/dL (ref 8.7–10.2)
Chloride: 98 mmol/L (ref 96–106)
Creatinine, Ser: 0.69 mg/dL (ref 0.57–1.00)
GFR calc Af Amer: 113 mL/min/{1.73_m2} (ref >59)
GFR calc non Af Amer: 98 mL/min/{1.73_m2} (ref >59)
Globulin, Total: 3.6 g/dL (ref 1.5–4.5)
Glucose: 83 mg/dL (ref 65–99)
Potassium: 4 mmol/L (ref 3.5–5.2)
Sodium: 142 mmol/L (ref 134–144)
Total Protein: 7.7 g/dL (ref 6.0–8.5)

## 2020-06-07 LAB — MICROALBUMIN / CREATININE URINE RATIO
Creatinine, Urine: 108.9 mg/dL
Microalb/Creat Ratio: 6 mg/g creat (ref 0–29)
Microalbumin, Urine: 6.1 ug/mL

## 2020-06-07 NOTE — Telephone Encounter (Signed)
Contacted pt to go over lab results pt is aware and doesn't have any questions or concerns 

## 2020-06-09 ENCOUNTER — Other Ambulatory Visit: Payer: Self-pay

## 2020-06-09 ENCOUNTER — Ambulatory Visit (INDEPENDENT_AMBULATORY_CARE_PROVIDER_SITE_OTHER): Payer: Self-pay | Admitting: Specialist

## 2020-06-09 ENCOUNTER — Encounter: Payer: Self-pay | Admitting: Specialist

## 2020-06-09 VITALS — BP 124/82 | HR 82 | Ht 71.0 in | Wt 274.0 lb

## 2020-06-09 DIAGNOSIS — M5416 Radiculopathy, lumbar region: Secondary | ICD-10-CM

## 2020-06-09 DIAGNOSIS — R202 Paresthesia of skin: Secondary | ICD-10-CM

## 2020-06-09 DIAGNOSIS — M5136 Other intervertebral disc degeneration, lumbar region: Secondary | ICD-10-CM

## 2020-06-09 DIAGNOSIS — R200 Anesthesia of skin: Secondary | ICD-10-CM

## 2020-06-09 DIAGNOSIS — M47816 Spondylosis without myelopathy or radiculopathy, lumbar region: Secondary | ICD-10-CM

## 2020-06-09 DIAGNOSIS — F419 Anxiety disorder, unspecified: Secondary | ICD-10-CM

## 2020-06-09 DIAGNOSIS — F329 Major depressive disorder, single episode, unspecified: Secondary | ICD-10-CM

## 2020-06-09 DIAGNOSIS — M48062 Spinal stenosis, lumbar region with neurogenic claudication: Secondary | ICD-10-CM

## 2020-06-09 DIAGNOSIS — M51369 Other intervertebral disc degeneration, lumbar region without mention of lumbar back pain or lower extremity pain: Secondary | ICD-10-CM

## 2020-06-09 DIAGNOSIS — R29898 Other symptoms and signs involving the musculoskeletal system: Secondary | ICD-10-CM

## 2020-06-09 DIAGNOSIS — F32A Depression, unspecified: Secondary | ICD-10-CM

## 2020-06-09 MED ORDER — DULOXETINE HCL 30 MG PO CSDR: 30.0000 mg | DELAYED_RELEASE_CAPSULE | Freq: Two times a day (BID) | ORAL | 3 refills | Status: DC

## 2020-06-09 NOTE — Progress Notes (Signed)
Office Visit Note   Patient: Sydney Williams           Date of Birth: 11/11/1965           MRN: [id number] Visit Date: 06/09/2020              Requested by: Antony Blackbird, MD [place],  [place] [address] PCP: Elsie Stain, MD   Assessment & Plan: Visit Diagnoses:  1. DDD (degenerative disc disease), lumbar   2. Lumbar back pain with radiculopathy affecting right lower extremity   3. Anxiety and depression   4. Spinal stenosis of lumbar region with neurogenic claudication   5. Spondylosis of lumbar spine   6. Weakness of both legs   7. Numbness and tingling of both feet     Plan:Avoid bending, stooping and avoid lifting weights greater than 10 lbs. Avoid prolong standing and walking. Order for a new walker with wheels. Surgery scheduling secretary Kandice Hams, will call you in the next week to schedule for surgery.  Surgery recommended is a three level lumbar fusion L2-3, L3-4 and L4-5 this would be done with rods, screws and cages with local bone graft and allograft (donor bone graft). Take tylenol, advil and baclofen and duloxetine for for pain. Risk of surgery includes risk of infection 1 in 100 patients, bleeding 1/2% chance you would need a transfusion. Risk to the nerves is one in 10,000. You will need to use a brace for 3 months and wean from the brace on the 4th month. Expect improved walking and standing tolerance. Expect relief of leg pain but numbness may persist depending on the length and degree of pressure that has been present. 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   Follow-Up Instructions: Return in about 4 weeks (around 07/07/2020).   Orders:  No orders of the defined types were placed in this encounter.  Meds ordered this encounter  Medications  . DULoxetine HCl 30 MG CSDR    Sig: Take 30 mg by mouth 2 (two) times daily.    Dispense:  60 capsule    Refill:  3      Procedures: No procedures performed   Clinical Data: Findings:     Assessment & Plan: I personally reviewed all images and lab data in the [facility] system as well as any outside material available during this office visit and agree with the  radiology impressions.   Lumbar radiculopathy, chronic Significant lumbar radiculopathy and the patient is medically cleared for planned surgical intervention  We will check a complete blood count metabolic panel today along with lipid panel and microalbumin.    EKG was normal at this visit  I do not believe cardiology evaluation is necessary  Elevated blood uric acid level Continue allopurinol  Impaired fasting blood sugar Continue Metformin daily hemoglobin A1c was 6.4  Smoker Patient still smoking 4 cigarettes daily encouraged her to completely quit in order to tolerate her surgery better  Moderate persistent asthma Moderate persistent asthma stable on long-acting dilator and steroid  I will refer  her to allergy for second opinion  Essential hypertension Good control of blood pressure on current program no changes were made   Diagnoses and all orders for this visit:  Type 2 diabetes mellitus without complication, without long-term current use of insulin (HCC) -     POCT glucose (manual entry) -     metFORMIN (GLUCOPHAGE) 500 MG tablet; Take 1 tablet (500 mg total) by mouth 2 (two) times daily with a meal. -     Comprehensive metabolic panel -     CBC with Differential/Platelet -     Lipid panel -     Microalbumin / creatinine urine ratio  Preop cardiovascular exam -      EKG 12-Lead -     Comprehensive metabolic panel -     CBC with Differential/Platelet -     Lipid panel  Mild peripheral edema -     furosemide (LASIX) 20 MG tablet; One pill once per day if needed for swelling  Essential hypertension -     amLODipine (NORVASC) 10 MG tablet; Take 1 tablet (10 mg total) by mouth daily. To lower blood pressure -     hydrochlorothiazide (HYDRODIURIL) 25 MG tablet; Take 1 tablet (25 mg total) by mouth daily. To lower blood pressure -     metoprolol tartrate (LOPRESSOR) 50 MG tablet; Take 1 tablet (50 mg total) by mouth 2 (two) times daily.  Lumbar back pain with radiculopathy affecting right lower extremity -     DULoxetine HCl 30 MG CSDR; Take 30 mg by mouth 2 (two) times daily.  Anxiety and depression -     DULoxetine HCl 30 MG CSDR; Take 30 mg by mouth 2 (two) times daily.  Impaired fasting blood sugar -     metFORMIN (GLUCOPHAGE) 500 MG tablet; Take 1 tablet (500 mg total) by mouth 2 (two) times daily with a meal.  Encounter for screening mammogram for malignant neoplasm of breast -     MM DIGITAL SCREENING BILATERAL; Future  Carpal tunnel syndrome on both sides -     Ambulatory referral to Orthopedic Surgery  Moderate persistent asthma without complication -     Ambulatory referral to Allergy  Non-seasonal allergic rhinitis due to other allergic trigger -     Ambulatory referral to Allergy  Lumbar radiculopathy, chronic  Elevated blood uric acid level  Smoker  Other orders -     buPROPion (WELLBUTRIN XL) 150 MG 24 hr tablet; Take 1 tablet (150 mg total) by mouth daily. -     atorvastatin (LIPITOR) 40 MG tablet; Take 1 tablet (40 mg total) by mouth daily.     Assessment & Plan Note by Elsie Stain, MD at 06/06/2020 11:35 AM  Author: Elsie Stain, MD Author Type: Physician Filed: 06/06/2020 11:36 AM Note Status: Written Cosign: Cosign Not Required Encounter Date: 06/06/2020 Problem: Essential  hypertension Editor: Elsie Stain, MD (Physician)               Good control of blood pressure on current program no changes were made  Assessment & Plan Note by Elsie Stain, MD at 06/06/2020 11:35 AM  Author: Elsie Stain, MD Author Type: Physician Filed: 06/06/2020 11:35 AM Note Status: Written Cosign: Cosign Not Required Encounter Date: 06/06/2020 Problem: Moderate persistent asthma Editor: Elsie Stain, MD (Physician)               Moderate persistent asthma stable on long-acting dilator and steroid  I will refer her  to allergy for second opinion  Assessment & Plan Note by Elsie Stain, MD at 06/06/2020 11:35 AM  Narrative & Impression CLINICAL DATA:  Low back pain and left leg pain. Bilateral lower extremity weakness and swelling.  EXAM: MRI LUMBAR SPINE WITHOUT CONTRAST  TECHNIQUE: Multiplanar, multisequence MR imaging of the lumbar spine was performed. No intravenous contrast was administered.  COMPARISON:  Radiographs dated 05/18/2019 and lumbar MRI dated 09/27/2010  FINDINGS: Segmentation:  Standard.  Alignment:  Physiologic.  Vertebrae: No fracture, evidence of discitis, or bone lesion. Patchy low signal from the bone marrow, not as prominent as on the prior study. The patient is a smoker.  Conus medullaris and cauda equina: Conus extends to the L1-2 level. Conus and cauda equina appear normal.  Paraspinal and other soft tissues: Negative.  Disc levels:  T12-L1 and L1-2: Normal discs. Hypertrophy of the facet joints bilaterally at each level without significant progression.  L2-3: Disc desiccation with a small broad-based disc bulge and central annular tear, increased since the prior study. Increased hypertrophy of the facet joints and ligamentum flavum without neural impingement or significant spinal stenosis.  L3-4: Progressive disc degeneration with progressive disc space narrowing. Small broad-based disc  bulge with accompanying osteophytes extending into both neural foramina with a new right far lateral disc protrusion which touches the right L3 nerve lateral to the neural foramen but does not appear to compress it. Moderate right and mild left facet arthritis, essentially unchanged. No focal neural impingement. Small amount of fluid in the right side of the disc space felt to be degenerative.  L4-5: Chronic marked disc space narrowing with extensive degenerative changes of the vertebral endplates, minimally progressed. Broad-based disc bulge with accompanying osteophytes extending into both neural foramina with symmetrical compression of the thecal sac and lateral recesses with chronic hypertrophy of the ligamentum flavum and facet joints, essentially unchanged. The L4 nerves appear to exit without impingement. Mild spinal stenosis at L4-5, unchanged.  L5-S1: Chronic disc desiccation with a tiny broad-based disc bulge with no neural impingement. Moderate bilateral facet arthritis. No neural impingement. No significant change since the prior study.  IMPRESSION: 1. Degenerative disc disease at L2-3 through L5-S1 without focal neural impingement. 2. Chronic degenerative disc disease at L4-5 with symmetrical compression of the thecal sac and lateral recesses without focal neural impingement. 3. Multilevel moderate facet arthritis without significant change since the prior study. 4. New right far lateral disc protrusion at L3-4 which touches the right L3 nerve lateral to the neural foramen but does not appear to compress it.   Electronically Signed   By: Lorriane Shire M.D.   On: 10/20/2019 16:20      Subjective: Chief Complaint  Patient presents with  . Lower Back - Follow-up    54 year old female with history of low back pain and Buttock and thigh pain. Pain is greater with bending and stooping and she avoids this now. She is having difficulty standing and  Walking  and is not able to use a cane due to pain in the shoulders. Dr. Asencion Noble is her primary care provider and he placed her on a walker for ambulation. She uses the walker all the time. No bowel or bladder difficulty. There is no pain with cough or sneeze. She has pain with lying down and has difficulty getting up after lying down. The legs feel tired all the way down both sides. She is gaining weight and this because she can not exercise adequately. The  legs feel numb mostly in the feet on the bottoms of the feet heel and little toes. She is currently taking ibuprofen 600 mg 4 times a day and tylenol and baclofen. Last seen we recommended a 3 level lumbar fusion for DDD and spondylolisthesis but she has been undergoing medical evaluation for clearance for  What would be expected to be a surgery of about 5-6 hours with risk of blood loss and prolong anesthesia. Dr. Joya Gaskins has seen her and evaluated her with an EKG and not much more. She had a  Nuclear stress test in 20-18 with an apical area of ischemia and decreased EF 45-55%  She wishes to proceed with intervention. Has ankle bracelets and toe nails painted but notes that she can not reach her feet her granddaughter age 70 helps her with this. Can not walk more than 2-3 steps without her walker. Her back pain is greater than any leg discomfort she has.   Review of Systems  Constitutional: Positive for activity change, appetite change and unexpected weight change. Negative for chills, diaphoresis, fatigue and fever.  HENT: Negative.  Negative for congestion, dental problem, drooling, ear discharge, ear pain, facial swelling, hearing loss, mouth sores, nosebleeds, postnasal drip, rhinorrhea, sinus pressure, sinus pain, sneezing, sore throat, tinnitus, trouble swallowing and voice change.   Eyes: Negative.  Negative for photophobia, pain, discharge, redness, itching and visual disturbance.  Respiratory: Negative.  Negative for apnea, cough, choking,  chest tightness, shortness of breath, wheezing and stridor.   Cardiovascular: Negative.  Negative for chest pain, palpitations and leg swelling.  Gastrointestinal: Negative.  Negative for abdominal distention, abdominal pain, anal bleeding, blood in stool, constipation, diarrhea, nausea, rectal pain and vomiting.  Endocrine: Negative.  Negative for cold intolerance, heat intolerance, polydipsia, polyphagia and polyuria.  Genitourinary: Negative.  Negative for difficulty urinating, dyspareunia, dysuria, enuresis, flank pain, frequency, genital sores, hematuria and urgency.  Musculoskeletal: Positive for back pain and gait problem. Negative for arthralgias, joint swelling, myalgias, neck pain and neck stiffness.  Skin: Negative.  Negative for color change, pallor, rash and wound.  Allergic/Immunologic: Positive for food allergies (Spring lettuce allergy). Negative for environmental allergies and immunocompromised state.  Neurological: Positive for weakness and numbness. Negative for dizziness, tremors, seizures, syncope, facial asymmetry, speech difficulty, light-headedness and headaches.  Hematological: Negative.  Negative for adenopathy. Does not bruise/bleed easily.  Psychiatric/Behavioral: Negative.  Negative for agitation, behavioral problems, confusion, decreased concentration, dysphoric mood, hallucinations, self-injury, sleep disturbance and suicidal ideas. The patient is not nervous/anxious and is not hyperactive.      Objective: Vital Signs: BP 124/82 (BP Location: Left Arm, Patient Position: Sitting)   Pulse 82   Ht 5\' 11"  (1.803 m)   Wt 274 lb (124.3 kg)   LMP 06/29/2015 (Exact Date)   BMI 38.22 kg/m   Physical Exam Constitutional:      Appearance: She is well-developed.  HENT:     Head: Normocephalic and atraumatic.  Eyes:     Pupils: Pupils are equal, round, and reactive to light.  Pulmonary:     Effort: Pulmonary effort is normal.     Breath sounds: Normal breath sounds.   Abdominal:     General: Bowel sounds are normal.     Palpations: Abdomen is soft.  Musculoskeletal:     Cervical back: Normal range of motion and neck supple.     Lumbar back: Positive right straight leg raise test and positive left straight leg raise test.  Skin:    General: Skin is  warm and dry.  Neurological:     Mental Status: She is alert and oriented to person, place, and time.  Psychiatric:        Behavior: Behavior normal.        Thought Content: Thought content normal.        Judgment: Judgment normal.     Back Exam   Tenderness  The patient is experiencing tenderness in the lumbar.  Range of Motion  Extension: abnormal  Flexion: abnormal  Lateral bend right: abnormal  Lateral bend left: abnormal  Rotation right: abnormal  Rotation left: abnormal   Muscle Strength  Right Quadriceps:  4/5  Left Quadriceps:  4/5  Right Hamstrings:  4/5  Left Hamstrings:  4/5   Tests  Straight leg raise right: positive Straight leg raise left: positive  Reflexes  Patellar: 0/4 Achilles: 0/4  Other  Toe walk: abnormal Heel walk: abnormal Sensation: decreased Gait: abnormal  Erythema: no back redness Scars: absent  Comments:  Positive elevated sed rate 48 in 08/2019 Bilateral hand swelling with pain, she is going to see Dr. Erlinda Hong later this month.  Both legs give away with testing knee extension, foot DF and plantar flexion and bilateral hip Flexion testing. She is painful with hip flexion. IR and ER is normal both hips without pain.  SLR causes LBP      Specialty Comments:  No specialty comments available.  Imaging: No results found.   PMFS History: Patient Active Problem List   Diagnosis Date Noted  . Lumbar radiculopathy, chronic 04/03/2020  . Elevated blood uric acid level 04/03/2020  . Family history of cervical cancer 02/02/2018  . Family history of breast cancer 02/02/2018  . Vitamin D deficiency 02/02/2018  . Depression 06/19/2017  .  Hypomagnesemia 06/19/2017  . 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 Snoring 09/11/2016  . History of migraine 09/11/2016  . Frequent headaches 09/11/2016  . Encounter for health maintenance examination in adult 12/04/2015  . Moderate persistent asthma 06/01/2015  . Gastroesophageal reflux disease without esophagitis 06/01/2015  . Rhinitis, allergic 06/01/2015  . Genital herpes 11/30/2014  . Smoker 11/30/2014  . Essential hypertension 11/30/2014  . Hyperlipidemia 11/30/2014  . Impaired fasting blood sugar 11/30/2014  . Obesity 07/02/2012  . Diabetes mellitus without complication ([place]) 61/44/3154   Past Medical History:  Diagnosis Date  . Asthma    2015 hospitaliation for asthma  . Diabetes mellitus    vs impaired fasting glucose  . Family history of breast cancer   . Fibroids 2010  . Genital herpes   . GERD (gastroesophageal reflux disease)   . Hyperlipidemia   . Hypertension   . Impaired fasting blood sugar   . Migraines   . Obesity   . Smoker     Family History  Problem Relation Age of Onset  . Diabetes Mother   . Hypertension Mother   . Aneurysm Mother   . Stroke Mother   . Cancer Mother        cervical cancer  . Cancer Maternal Aunt        breast  . Cancer Cousin        breast/breast  . Heart disease Neg Hx   . Colon cancer Neg Hx     Past Surgical History:  Procedure Laterality Date  . COLONOSCOPY  03/13/2016   Dr. Carlean Purl, normal, repeat 2027  . CYST EXCISION     left neck/postauricular region, benign  . LAPAROSCOPIC ABDOMINAL EXPLORATION     removal of  ectopic preg  . LEFT HEART CATH AND CORONARY ANGIOGRAPHY N/A 06/23/2017   Procedure: LEFT HEART CATH AND CORONARY ANGIOGRAPHY;  Surgeon: Nelva Bush, MD;  Location: [facility];  Service: Cardiovascular;  Laterality: N/A;  . TUBAL LIGATION     Social History   Occupational History  . Not on file  Tobacco Use  . Smoking status: Current Every Day Smoker    Packs/day: 0.25     Years: 14.00    Pack years: 3.50    Types: Cigarettes  . Smokeless tobacco: Never Used  Vaping Use  . Vaping Use: Never used  Substance and Sexual Activity  . Alcohol use: Yes    Comment: occ  . Drug use: No  . Sexual activity: Yes    Birth control/protection: Surgical

## 2020-06-09 NOTE — Patient Instructions (Signed)
Plan:Avoid bending, stooping and avoid lifting weights greater than 10 lbs. Avoid prolong standing and walking. Order for a new walker with wheels. Surgery scheduling secretary Kandice Hams, will call you in the next week to schedule for surgery.  Surgery recommended is a three level lumbar fusion L2-3, L3-4 and L4-5 this would be done with rods, screws and cages with local bone graft and allograft (donor bone graft). Take tylenol, advil and baclofen and duloxetine for for pain. Risk of surgery includes risk of infection 1 in 100 patients, bleeding 1/2% chance you would need a transfusion. Risk to the nerves is one in 10,000. You will need to use a brace for 3 months and wean from the brace on the 4th month. Expect improved walking and standing tolerance. Expect relief of leg pain but numbness may persist depending on the length and degree of pressure that has been present. 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

## 2020-06-14 MED FILL — FUROSEMIDE 20 MG TABS: 20 | 20 days supply | Qty: 20 | Fill #0

## 2020-06-14 MED FILL — ?DULoxetine HCL 30MG CPEP: 30 | 30 days supply | Qty: 60 | Fill #0

## 2020-06-14 MED FILL — ?BUPROPION HCL XL 150 MG TA: 150 | 30 days supply | Qty: 30 | Fill #0

## 2020-06-14 MED FILL — ?METFORMIN HCL 500MG TABL: 500 | 30 days supply | Qty: 60 | Fill #0

## 2020-06-15 MED FILL — ?METOPROLOL TART 50 MG TABL: 50 | 30 days supply | Qty: 60 | Fill #2

## 2020-06-21 ENCOUNTER — Ambulatory Visit (INDEPENDENT_AMBULATORY_CARE_PROVIDER_SITE_OTHER): Payer: Self-pay

## 2020-06-21 ENCOUNTER — Ambulatory Visit: Payer: Self-pay

## 2020-06-21 ENCOUNTER — Encounter: Payer: Self-pay | Admitting: Orthopaedic Surgery

## 2020-06-21 ENCOUNTER — Ambulatory Visit (INDEPENDENT_AMBULATORY_CARE_PROVIDER_SITE_OTHER): Payer: Self-pay | Admitting: Orthopaedic Surgery

## 2020-06-21 DIAGNOSIS — M79641 Pain in right hand: Secondary | ICD-10-CM

## 2020-06-21 DIAGNOSIS — M79642 Pain in left hand: Secondary | ICD-10-CM

## 2020-06-21 NOTE — Progress Notes (Signed)
Office Visit Note   Patient: Sydney Williams           Date of Birth: August 17, 1966           MRN: [id number] Visit Date: 06/21/2020              Requested by: Elsie Stain, MD [address],  [place] [id number] PCP: Elsie Stain, MD   Assessment & Plan: Visit Diagnoses:  1. Pain in both hands     Plan: Impression is bilateral hand pain.  We will certainly obtain nerve conduction studies to evaluate for carpal tunnel syndrome but I am also concerned that she may have underlying rheumatoid arthritis based on her presentation and radiographic findings.  Arthritis panel was obtained today.  Wrist brace is provided today.  Follow-up after the nerve conduction studies.  Follow-Up Instructions: Return if symptoms worsen or fail to improve.   Orders:  Orders Placed This Encounter  Procedures  . XR Hand Complete Left  . XR Hand Complete Right  . Uric acid  . Antinuclear Antib (ANA)  . Rheumatoid Factor  . Sed Rate (ESR)  . Ambulatory referral to Physical Medicine Rehab   No orders of the defined types were placed in this encounter.     Procedures: No procedures performed   Clinical Data: No additional findings.   Subjective: Chief Complaint  Patient presents with  . Left Hand - Numbness, Pain  . Right Hand - Numbness, Pain    Sydney Williams is a 54 year old female comes in for bilateral hand pain with numbness and tingling for about 2 months.  She feels a lot of stiffness in closing her hands.  She states that they are very painful to touch especially in the morning.  She is currently taking Tylenol and ibuprofen for the pain.  Her primary care doctor had concerns of carpal tunnel syndrome.   Review of Systems  Constitutional: Negative.   HENT: Negative.   Eyes: Negative.   Respiratory: Negative.   Cardiovascular: Negative.   Endocrine: Negative.   Musculoskeletal: Negative.   Neurological: Negative.   Hematological: Negative.   Psychiatric/Behavioral:  Negative.   All other systems reviewed and are negative.    Objective: Vital Signs: LMP 06/29/2015 (Exact Date)   Physical Exam Vitals and nursing note reviewed.  Constitutional:      Appearance: She is well-developed.  HENT:     Head: Normocephalic and atraumatic.  Pulmonary:     Effort: Pulmonary effort is normal.  Abdominal:     Palpations: Abdomen is soft.  Musculoskeletal:     Cervical back: Neck supple.  Skin:    General: Skin is warm.     Capillary Refill: Capillary refill takes less than 2 seconds.  Neurological:     Mental Status: She is alert and oriented to person, place, and time.  Psychiatric:        Behavior: Behavior normal.        Thought Content: Thought content normal.        Judgment: Judgment normal.     Ortho Exam Bilateral hands showed no swelling.  Her IP joints are tender to palpation.  Positive Durkan sign.  No skin or temperature changes.  No neurovascular compromise. Specialty Comments:  No specialty comments available.  Imaging: XR Hand Complete Left  Result Date: 06/21/2020 Periarticular erosions of the MCP and PIP joints.    XR Hand Complete Right  Result Date: 06/21/2020 Periarticular erosions of the MCP and PIP  joints.      PMFS History: Patient Active Problem List   Diagnosis Date Noted  . Lumbar radiculopathy, chronic 04/03/2020  . Elevated blood uric acid level 04/03/2020  . Family history of cervical cancer 02/02/2018  . Family history of breast cancer 02/02/2018  . Vitamin D deficiency 02/02/2018  . Depression 06/19/2017  . Hypomagnesemia 06/19/2017  . 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 Snoring 09/11/2016  . History of migraine 09/11/2016  . Frequent headaches 09/11/2016  . Encounter for health maintenance examination in adult 12/04/2015  . Moderate persistent asthma 06/01/2015  . Gastroesophageal reflux disease without esophagitis 06/01/2015  . Rhinitis, allergic 06/01/2015  . Genital herpes  11/30/2014  . Smoker 11/30/2014  . Essential hypertension 11/30/2014  . Hyperlipidemia 11/30/2014  . Impaired fasting blood sugar 11/30/2014  . Obesity 07/02/2012  . Diabetes mellitus without complication ([place]) 69/48/5462   Past Medical History:  Diagnosis Date  . Asthma    2015 hospitaliation for asthma  . Diabetes mellitus    vs impaired fasting glucose  . Family history of breast cancer   . Fibroids 2010  . Genital herpes   . GERD (gastroesophageal reflux disease)   . Hyperlipidemia   . Hypertension   . Impaired fasting blood sugar   . Migraines   . Obesity   . Smoker     Family History  Problem Relation Age of Onset  . Diabetes Mother   . Hypertension Mother   . Aneurysm Mother   . Stroke Mother   . Cancer Mother        cervical cancer  . Cancer Maternal Aunt        breast  . Cancer Cousin        breast/breast  . Heart disease Neg Hx   . Colon cancer Neg Hx     Past Surgical History:  Procedure Laterality Date  . COLONOSCOPY  03/13/2016   Dr. Carlean Purl, normal, repeat 2027  . CYST EXCISION     left neck/postauricular region, benign  . LAPAROSCOPIC ABDOMINAL EXPLORATION     removal of ectopic preg  . LEFT HEART CATH AND CORONARY ANGIOGRAPHY N/A 06/23/2017   Procedure: LEFT HEART CATH AND CORONARY ANGIOGRAPHY;  Surgeon: Nelva Bush, MD;  Location: [facility];  Service: Cardiovascular;  Laterality: N/A;  . TUBAL LIGATION     Social History   Occupational History  . Not on file  Tobacco Use  . Smoking status: Current Every Day Smoker    Packs/day: 0.25    Years: 14.00    Pack years: 3.50    Types: Cigarettes  . Smokeless tobacco: Never Used  Vaping Use  . Vaping Use: Never used  Substance and Sexual Activity  . Alcohol use: Yes    Comment: occ  . Drug use: No  . Sexual activity: Yes    Birth control/protection: Surgical

## 2020-06-23 ENCOUNTER — Other Ambulatory Visit: Payer: Self-pay

## 2020-06-23 DIAGNOSIS — M79641 Pain in right hand: Secondary | ICD-10-CM

## 2020-06-23 DIAGNOSIS — M7989 Other specified soft tissue disorders: Secondary | ICD-10-CM

## 2020-06-23 DIAGNOSIS — R29898 Other symptoms and signs involving the musculoskeletal system: Secondary | ICD-10-CM

## 2020-06-23 DIAGNOSIS — R200 Anesthesia of skin: Secondary | ICD-10-CM

## 2020-06-23 DIAGNOSIS — R202 Paresthesia of skin: Secondary | ICD-10-CM

## 2020-06-23 LAB — URIC ACID: Uric Acid, Serum: 6.6 mg/dL (ref 2.5–7.0)

## 2020-06-23 LAB — RHEUMATOID FACTOR: Rheumatoid fact SerPl-aCnc: <14 IU/mL (ref <14)

## 2020-06-23 LAB — SEDIMENTATION RATE: Sed Rate: 99 mm/h — ABNORMAL HIGH (ref 0–30)

## 2020-06-23 LAB — ANA: Anti Nuclear Antibody (ANA): NEGATIVE

## 2020-06-23 NOTE — Progress Notes (Signed)
Refer to rheumatology please.  Can't explain why her ESR is elevated.  Thanks.

## 2020-06-28 MED FILL — AMOXICILLIN 500 MG CAPSULE: 500 | 7 days supply | Qty: 25 | Fill #0

## 2020-07-05 ENCOUNTER — Other Ambulatory Visit: Payer: Self-pay | Admitting: Family Medicine

## 2020-07-05 MED FILL — ?ATORVASTATIN 40MG TABLET: 40 | 30 days supply | Qty: 30 | Fill #0

## 2020-07-05 MED FILL — HYDROCHLOROTHIAZIDE 25 MG T: 25 | 30 days supply | Qty: 30 | Fill #2

## 2020-07-05 MED FILL — BACLOFEN 10 MG TABLET: 10 | 15 days supply | Qty: 90 | Fill #0

## 2020-07-05 MED FILL — ?ALLOPURINOL 100MG TABLET: 100 | 30 days supply | Qty: 30 | Fill #2

## 2020-07-05 MED FILL — AMLODIPINE BESYLATE 10 MG T: 10 | 30 days supply | Qty: 30 | Fill #2

## 2020-07-10 ENCOUNTER — Telehealth: Payer: Self-pay | Admitting: Critical Care Medicine

## 2020-07-10 MED FILL — GABAPENTIN 100 MG CAPSULE: 100 | 30 days supply | Qty: 180 | Fill #2

## 2020-07-10 NOTE — Telephone Encounter (Signed)
Returned call to patient and left a message with patient husband to return call.    Copied from [place] [phone]. Topic: Appointment Scheduling - Scheduling Inquiry for Clinic >> Jul 10, 2020 11:54 AM Scherrie Gerlach wrote: Reason for CRM: pt states she went to funeral and people there have called her and reported she was exposed to positive covid people.  Would like to get a test please

## 2020-07-10 NOTE — Telephone Encounter (Signed)
Pt returning call. Attempted to contact office. Please advise.

## 2020-07-12 ENCOUNTER — Ambulatory Visit: Payer: Medicaid Other | Attending: Family Medicine

## 2020-07-12 DIAGNOSIS — Z1152 Encounter for screening for COVID-19: Secondary | ICD-10-CM

## 2020-07-12 NOTE — Progress Notes (Signed)
Patient arrived at clinic to get covid testing done.  Patient was informed that results will be called within 48-72 hours.

## 2020-07-14 LAB — SARS-COV-2, NAA 2 DAY TAT

## 2020-07-14 LAB — NOVEL CORONAVIRUS, NAA: SARS-CoV-2, NAA: NOT DETECTED

## 2020-07-17 ENCOUNTER — Other Ambulatory Visit: Payer: Self-pay | Admitting: Family Medicine

## 2020-07-17 DIAGNOSIS — M5416 Radiculopathy, lumbar region: Secondary | ICD-10-CM

## 2020-07-17 MED FILL — IBUPROFEN 600 MG TABLET: 600 | 30 days supply | Qty: 90 | Fill #0

## 2020-07-18 NOTE — Progress Notes (Signed)
New Patient Note  RE: Sydney Williams MRN: [id number] DOB: 1966-05-28 Date of Office Visit: 07/19/2020  Referring provider: Elsie Stain, MD Primary care provider: Elsie Stain, MD  Chief Complaint: Asthma and Allergic Rhinitis   History of Present Illness: I had the pleasure of seeing Sydney Williams for initial evaluation at the [facility] on 07/19/2020. She is a 53 y.o. female, who is referred here by Elsie Stain, MD for the evaluation of asthma and allergic rhinitis.  Asthma:  She reports symptoms of chest tightness, shortness of breath, coughing with minimal phlegm, wheezing, nocturnal awakenings for 8 years but worse the past year. Current medications include Advair 264mcg 1 puff BID x 2-3 years and albuterol prn which help. She reports not using aerochamber with inhalers. She tried the following inhalers: albuterol nebulizer. Main triggers are allergies, infections, weather changes. In the last month, frequency of symptoms: daily. Frequency of nocturnal symptoms: twice per night. Frequency of SABA use: 3 times per day for the past year. Interference with physical activity: sometimes. Sleep is disturbed. In the last 12 months, emergency room visits/urgent care visits/doctor office visits or hospitalizations due to respiratory issues: 3. In the last 12 months, oral steroids courses: 3 times. Lifetime history of hospitalization for respiratory issues: once. Prior intubations: no. History of pneumonia: no. She was not evaluated by allergist/pulmonologist in the past. Smoking exposure: 5 cigs per day. Up to date with flu vaccine: yes. Up to date with pneumonia vaccine: patient had reaction to it. Up to date with COVID-19 vaccine: yes.  History of reflux: yes and takes omeprazole daily. Taking Motrin 600mg  since 2018.  Rhinitis:  She reports symptoms of rhinorrhea, itchy/watery eyes, nasal congestion. Symptoms have been going on for 30 years. The symptoms are  present all year around with worsening in spring. Anosmia: no. Headache: yes. She has used Claritin and benadryl with some improvement in symptoms. Sinus infections: no. Previous work up includes: none. Previous ENT evaluation: no. Previous sinus imaging: no. History of nasal polyps: no. Last eye exam: 3 years ago.  Assessment and Plan: Sydney Williams is a 54 y.o. female with: Not well controlled asthma without complication Diagnosed with asthma 8 years ago but symptoms worse the last year.  Currently on Advair 250 mcg 1 puff twice a day and albuterol 3 times a day with some benefit.  Main triggers include allergies, infections and weather changes.  3 courses of prednisone within the past year. Patient is a current smoker.  Takes omeprazole for reflux.  Today's spirometry consistent with mixed restriction and obstruction with no improvement in FEV1 post bronchodilator treatment. Clinically feeling improved and wheezing resolved.  . Discussed smoking cessation.  . Given her history of smoking there may be a component of COPD contributing to her respiratory symptoms.  . Daily controller medication(s): start Trelegy 282mcg 1 puff once a day. Sample given.  o This replaces Advair for now.  . May use albuterol rescue inhaler 2 puffs or nebulizer every 4 to 6 hours as needed for shortness of breath, chest tightness, coughing, and wheezing. May use albuterol rescue inhaler 2 puffs 5 to 15 minutes prior to strenuous physical activities. Monitor frequency of use.  . Repeat spirometry at next visit. . If no improvement in symptoms will consider adding biologics treatment next.  Other allergic rhinitis Perennial rhinoconjunctivitis symptoms for 30 years with worsening in the spring.  Tried Claritin and Benadryl with some benefit.  No prior allergy/ENT evaluation.  Today's skin testing showed: Positive to grass, weed, ragweed, trees, dust mites. Borderline to feathers.   Start environmental control measures as  below. Start Singulair (montelukast) 10mg  daily at night. Cautioned that in some children/adults can experience behavioral changes including hyperactivity, agitation, depression, sleep disturbances and suicidal ideations. These side effects are rare, but if you notice them you should notify me and discontinue Singulair (montelukast).  May use over the counter antihistamines such as Zyrtec (cetirizine), Claritin (loratadine), Allegra (fexofenadine), or Xyzal (levocetirizine) daily as needed.  Declines nasal sprays and eyedrops for now.  Allergic conjunctivitis of both eyes  See assessment and plan as above for allergic rhinitis.  Return in about 2 months (around 09/18/2020).  Meds ordered this encounter  Medications  . Fluticasone-Umeclidin-Vilant (TRELEGY ELLIPTA) 200-62.5-25 MCG/INH AEPB    Sig: Inhale 1 puff into the lungs daily. Rinse mouth after each use.    Dispense:  28 each    Refill:  2  . montelukast (SINGULAIR) 10 MG tablet    Sig: Take 1 tablet (10 mg total) by mouth at bedtime.    Dispense:  30 tablet    Refill:  5   Other allergy screening: Food allergy: yes  Spring lettuce causes tongue swelling.   Medication allergy: yes Hymenoptera allergy: no Urticaria: no Eczema:no History of recurrent infections suggestive of immunodeficency: no  Diagnostics: Spirometry:  Tracings reviewed. Her effort: Good reproducible efforts. FVC: 2.47L FEV1: 1.84L, 64% predicted FEV1/FVC ratio: 74% Interpretation: Spirometry consistent with mixed restriction and obstruction with no improvement in FEV1 post bronchodilator treatment. Clinically feeling improved and wheezing resolved.  Please see scanned spirometry results for details.  Skin Testing: Environmental allergy panel and select foods. Positive to grass, weed, ragweed, trees, dust mites. Borderline to feathers.  Negative to common foods. Results discussed with patient/family.  Airborne Adult Perc - 07/19/20 0937    Time  Antigen Placed 8341    Allergen Manufacturer Lavella Hammock    Location Back    Number of Test 59    Panel 1 Select    1. Control-Buffer 50% Glycerol Negative    2. Control-Histamine 1 mg/ml 2+    3. Albumin saline Negative    4. [place] Negative    5. [place] Negative    6. Johnson Negative    7. Kentucky Blue 2+    8. Meadow Fescue 2+    9. Perennial Rye 2+    10. Sweet Vernal 2+    11. Timothy Negative    12. Cocklebur Negative    13. Burweed Marshelder --   +/-   14. Ragweed, short 2+    15. Ragweed, Giant Negative    16. Plantain,  English Negative    17. Lamb's Quarters Negative    18. Sheep Sorrell Negative    19. Rough Pigweed Negative    20. Marsh Elder, Rough Negative    21. Mugwort, Common 4+    22. Ash mix Negative    23. Birch mix Negative    24. Beech American Negative    25. Box, Elder Negative    26. Cedar, red Negative    27. Cottonwood, [place] Negative    28. Elm mix Negative    29. Hickory Negative    30. Maple mix Negative    31. Oak, [place] mix Negative    32. Pecan Pollen Negative    33. Pine mix Negative    34. Sycamore Eastern Negative    35. Greycliff, Black Pollen Negative    36.  Alternaria alternata Negative    37. Cladosporium Herbarum Negative    38. Aspergillus mix Negative    39. Penicillium mix Negative    40. Bipolaris sorokiniana (Helminthosporium) Negative    41. Drechslera spicifera (Curvularia) Negative    42. Mucor plumbeus Negative    43. Fusarium moniliforme Negative    44. Aureobasidium pullulans (pullulara) Negative    45. Rhizopus oryzae Negative    46. Botrytis cinera Negative    47. Epicoccum nigrum Negative    48. Phoma betae Negative    49. Candida Albicans Negative    50. Trichophyton mentagrophytes Negative    51. Mite, D Farinae  5,000 AU/ml 3+    52. Mite, D Pteronyssinus  5,000 AU/ml 2+    53. Cat Hair 10,000 BAU/ml Negative    54.  Dog Epithelia Negative    55. Mixed Feathers --   +/-   56. Horse Epithelia Negative      57. Cockroach, German Negative    58. Mouse Negative    59. Tobacco Leaf Negative          Food Perc - 07/19/20 0937      Test Information   Time Antigen Placed 7035    Allergen Manufacturer Lavella Hammock    Location Back    Number of allergen test Bell   1. Peanut Negative    2. Soybean food Negative    3. Wheat, whole Negative    4. Sesame Negative    5. Milk, cow Negative    6. Egg White, chicken Negative    7. Casein Negative    8. Shellfish mix Negative    9. Fish mix Negative    10. Cashew Negative          Intradermal - 07/19/20 1024    Time Antigen Placed 1024    Allergen Manufacturer Lavella Hammock    Location Arm    Number of Test 11    Intradermal Select    Control Negative    [place] 2+    Johnson 2+    Tree mix 2+    Mold 1 Negative    Mold 2 Negative    Mold 3 Negative    Mold 4 Negative    Cat Negative    Dog Negative    Cockroach Negative           Past Medical History: Patient Active Problem List   Diagnosis Date Noted  . Allergic conjunctivitis of both eyes 07/19/2020  . Lumbar radiculopathy, chronic 04/03/2020  . Elevated blood uric acid level 04/03/2020  . Family history of cervical cancer 02/02/2018  . Family history of breast cancer 02/02/2018  . Vitamin D deficiency 02/02/2018  . Depression 06/19/2017  . Hypomagnesemia 06/19/2017  . 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 Snoring 09/11/2016  . History of migraine 09/11/2016  . Frequent headaches 09/11/2016  . Encounter for health maintenance examination in adult 12/04/2015  . Moderate persistent asthma 06/01/2015  . Gastroesophageal reflux disease without esophagitis 06/01/2015  . Other allergic rhinitis 06/01/2015  . Genital herpes 11/30/2014  . Tobacco user 11/30/2014  . Essential hypertension 11/30/2014  . Hyperlipidemia 11/30/2014  . Impaired fasting blood sugar 11/30/2014  . Not well controlled asthma without complication 00/93/8182  .  Obesity 07/02/2012  . Diabetes mellitus without complication ([place]) 99/37/1696   Past Medical History:  Diagnosis Date  . Asthma    2015 hospitaliation for asthma  .  Diabetes mellitus    vs impaired fasting glucose  . Family history of breast cancer   . Fibroids 2010  . Genital herpes   . GERD (gastroesophageal reflux disease)   . Hyperlipidemia   . Hypertension   . Impaired fasting blood sugar   . Migraines   . Obesity   . Smoker    Past Surgical History: Past Surgical History:  Procedure Laterality Date  . COLONOSCOPY  03/13/2016   Dr. Carlean Purl, normal, repeat 2027  . CYST EXCISION     left neck/postauricular region, benign  . LAPAROSCOPIC ABDOMINAL EXPLORATION     removal of ectopic preg  . LEFT HEART CATH AND CORONARY ANGIOGRAPHY N/A 06/23/2017   Procedure: LEFT HEART CATH AND CORONARY ANGIOGRAPHY;  Surgeon: Nelva Bush, MD;  Location: [facility];  Service: Cardiovascular;  Laterality: N/A;  . TUBAL LIGATION     Medication List:  Current Outpatient Medications  Medication Sig Dispense Refill  . acyclovir (ZOVIRAX) 200 MG capsule Take 1 capsule (200 mg total) by mouth 2 (two) times daily. 180 capsule 3  . albuterol (PROVENTIL) (2.5 MG/3ML) 0.083% nebulizer solution Take 3 mLs (2.5 mg total) by nebulization every 4 (four) hours as needed for wheezing or shortness of breath. 75 mL 0  . albuterol (VENTOLIN HFA) 108 (90 Base) MCG/ACT inhaler Inhale 1-2 puffs into the lungs every 6 (six) hours as needed for wheezing or shortness of breath. 18 g 0  . allopurinol (ZYLOPRIM) 100 MG tablet Take 1 tablet (100 mg total) by mouth daily. 30 tablet 6  . amLODipine (NORVASC) 10 MG tablet Take 1 tablet (10 mg total) by mouth daily. To lower blood pressure 90 tablet 0  . aspirin (EQ ASPIRIN ADULT LOW DOSE) 81 MG EC tablet Take 1 tablet (81 mg total) by mouth daily. Swallow whole. 90 tablet 3  . atorvastatin (LIPITOR) 40 MG tablet Take 1 tablet (40 mg total) by mouth daily. 30  tablet 2  . baclofen (LIORESAL) 10 MG tablet TAKE 1-2 TABLETS (10-20 MG TOTAL) BY MOUTH 3 (THREE) TIMES DAILY AS NEEDED FOR MUSCLE SPASMS. 90 tablet 1  . diphenhydrAMINE (BENADRYL) 25 MG tablet Take 1 tablet (25 mg total) by mouth every 6 (six) hours as needed. 30 tablet 0  . DULoxetine HCl 30 MG CSDR Take 30 mg by mouth 2 (two) times daily. 60 capsule 3  . fluticasone (FLONASE) 50 MCG/ACT nasal spray Place 2 sprays into both nostrils daily. 15.8 mL 1  . furosemide (LASIX) 20 MG tablet One pill once per day if needed for swelling 20 tablet 0  . gabapentin (NEURONTIN) 100 MG capsule 2 in morning, 2 in late afternoon and 1-2 at bedtime 180 capsule 3  . hydrochlorothiazide (HYDRODIURIL) 25 MG tablet Take 1 tablet (25 mg total) by mouth daily. To lower blood pressure 90 tablet 3  . ibuprofen (ADVIL) 600 MG tablet TAKE 1 TABLET (600 MG TOTAL) BY MOUTH EVERY 8 (EIGHT) HOURS AS NEEDED FOR MODERATE PAIN. TAKE AFTER EATING 90 tablet 1  . metFORMIN (GLUCOPHAGE) 500 MG tablet Take 1 tablet (500 mg total) by mouth 2 (two) times daily with a meal. 90 tablet 1  . metoprolol tartrate (LOPRESSOR) 50 MG tablet Take 1 tablet (50 mg total) by mouth 2 (two) times daily. 60 tablet 2  . omeprazole (PRILOSEC) 40 MG capsule Take 1 capsule (40 mg total) by mouth 2 (two) times daily. To decrease stomach acid 60 capsule 3  . Vitamin D, Cholecalciferol, 25 MCG (1000  UT) TABS Take 1 tablet by mouth once daily 90 tablet 0  . buPROPion (WELLBUTRIN XL) 150 MG 24 hr tablet Take 1 tablet (150 mg total) by mouth daily. 90 tablet 0  . Fluticasone-Umeclidin-Vilant (TRELEGY ELLIPTA) 200-62.5-25 MCG/INH AEPB Inhale 1 puff into the lungs daily. Rinse mouth after each use. 28 each 2  . montelukast (SINGULAIR) 10 MG tablet Take 1 tablet (10 mg total) by mouth at bedtime. 30 tablet 5   No current facility-administered medications for this visit.   Allergies: Allergies  Allergen Reactions  . Lisinopril Swelling  . Potassium Chloride  Shortness Of Breath and Swelling    Tolerates IV KCl, reaction only to PO product  . Pneumococcal Vaccines Swelling and Other (See Comments)    Reaction:  Swelling at injection site  . Vicodin [Hydrocodone-Acetaminophen] Itching and Rash   Social History: Social History   Socioeconomic History  . Marital status: Legally Separated    Spouse name: Not on file  . Number of children: Not on file  . Years of education: Not on file  . Highest education level: Not on file  Occupational History  . Not on file  Tobacco Use  . Smoking status: Current Every Day Smoker    Packs/day: 0.25    Years: 14.00    Pack years: 3.50    Types: Cigarettes  . Smokeless tobacco: Never Used  Vaping Use  . Vaping Use: Never used  Substance and Sexual Activity  . Alcohol use: Yes    Comment: occ  . Drug use: No  . Sexual activity: Yes    Birth control/protection: Surgical  Other Topics Concern  . Not on file  Social History Narrative   Married, and her granddaughter lives with her, works as Quarry manager at [employer], exercise - activity at work, walking.  12/2016   Social Determinants of Health   Financial Resource Strain:   . Difficulty of Paying Living Expenses: Not on file  Food Insecurity:   . Worried About Charity fundraiser in the Last Year: Not on file  . Ran Out of Food in the Last Year: Not on file  Transportation Needs:   . Lack of Transportation (Medical): Not on file  . Lack of Transportation (Non-Medical): Not on file  Physical Activity:   . Days of Exercise per Week: Not on file  . Minutes of Exercise per Session: Not on file  Stress:   . Feeling of Stress : Not on file  Social Connections:   . Frequency of Communication with Friends and Family: Not on file  . Frequency of Social Gatherings with Friends and Family: Not on file  . Attends Religious Services: Not on file  . Active Member of Clubs or Organizations: Not on file  . Attends Archivist Meetings: Not on file  .  Marital Status: Not on file   Lives in a 55 year old apartment. Smoking: 5 cigs per day x 40 years Occupation: not employed  Environmental HistoryFreight forwarder in the house: yes Carpet in the family room: no Carpet in the bedroom: no Heating: gas Cooling: window Pet: no  Family History: Family History  Problem Relation Age of Onset  . Diabetes Mother   . Hypertension Mother   . Aneurysm Mother   . Stroke Mother   . Cancer Mother        cervical cancer  . Cancer Maternal Aunt        breast  . Cancer Cousin  breast/breast  . Heart disease Neg Hx   . Colon cancer Neg Hx   . Allergic rhinitis Neg Hx   . Angioedema Neg Hx   . Asthma Neg Hx   . Atopy Neg Hx   . Eczema Neg Hx   . Immunodeficiency Neg Hx   . Urticaria Neg Hx    Review of Systems  Constitutional: Negative for appetite change, chills, fever and unexpected weight change.  HENT: Negative for congestion and rhinorrhea.   Eyes: Positive for itching.  Respiratory: Positive for cough, chest tightness, shortness of breath and wheezing.   Cardiovascular: Negative for chest pain.  Gastrointestinal: Negative for abdominal pain.  Genitourinary: Negative for difficulty urinating.  Skin: Negative for rash.  Allergic/Immunologic: Positive for environmental allergies. Negative for food allergies.  Neurological: Negative for headaches.   Objective: BP 112/78   Pulse 75   Temp 98 F (36.7 C) (Temporal)   Resp 16   Ht 5\' 11"  (1.803 m)   Wt 281 lb 6.4 oz (127.6 kg)   LMP 06/29/2015 (Exact Date)   SpO2 96%   BMI 39.25 kg/m  Body mass index is 39.25 kg/m. Physical Exam Vitals and nursing note reviewed.  Constitutional:      Appearance: Normal appearance. She is well-developed.  HENT:     Head: Normocephalic and atraumatic.     Right Ear: External ear normal.     Left Ear: External ear normal.     Nose: Nose normal.     Mouth/Throat:     Mouth: Mucous membranes are moist.     Pharynx:  Oropharynx is clear.  Eyes:     Conjunctiva/sclera: Conjunctivae normal.  Cardiovascular:     Rate and Rhythm: Normal rate and regular rhythm.     Heart sounds: Normal heart sounds. No murmur heard.  No friction rub. No gallop.   Pulmonary:     Effort: Pulmonary effort is normal.     Breath sounds: Wheezing present. No rhonchi or rales.  Musculoskeletal:     Cervical back: Neck supple.  Skin:    General: Skin is warm.     Findings: No rash.  Neurological:     Mental Status: She is alert and oriented to person, place, and time.  Psychiatric:        Behavior: Behavior normal.    The plan was reviewed with the patient/family, and all questions/concerned were addressed.  It was my pleasure to see Kassity today and participate in her care. Please feel free to contact me with any questions or concerns.  Sincerely,  Rexene Alberts, DO Allergy & Immunology  [facility] office: [phone] [facility] office: [place] office: [phone]

## 2020-07-19 ENCOUNTER — Other Ambulatory Visit: Payer: Self-pay

## 2020-07-19 ENCOUNTER — Ambulatory Visit (INDEPENDENT_AMBULATORY_CARE_PROVIDER_SITE_OTHER): Payer: No Typology Code available for payment source | Admitting: Allergy

## 2020-07-19 ENCOUNTER — Other Ambulatory Visit: Payer: Self-pay | Admitting: Allergy

## 2020-07-19 ENCOUNTER — Encounter: Payer: Self-pay | Admitting: Allergy

## 2020-07-19 VITALS — BP 112/78 | HR 75 | Temp 98.0°F | Resp 16 | Ht 71.0 in | Wt 281.4 lb

## 2020-07-19 DIAGNOSIS — H1013 Acute atopic conjunctivitis, bilateral: Secondary | ICD-10-CM | POA: Insufficient documentation

## 2020-07-19 DIAGNOSIS — J45909 Unspecified asthma, uncomplicated: Secondary | ICD-10-CM

## 2020-07-19 DIAGNOSIS — J3089 Other allergic rhinitis: Secondary | ICD-10-CM

## 2020-07-19 DIAGNOSIS — Z720 Tobacco use: Secondary | ICD-10-CM

## 2020-07-19 MED ORDER — TRELEGY ELLIPTA 200-62.5-25 MCG/INH IN AEPB: 1.0000 | INHALATION_SPRAY | Freq: Every day | RESPIRATORY_TRACT | 2 refills | Status: DC

## 2020-07-19 MED ORDER — MONTELUKAST SODIUM 10 MG PO TABS: 10.0000 mg | ORAL_TABLET | Freq: Every day | ORAL | 5 refills | Status: DC

## 2020-07-19 MED FILL — MONTELUKAST SOD 10 MG TAB: 10 | 30 days supply | Qty: 30 | Fill #0

## 2020-07-19 MED FILL — TRELEGY ELLIPTA 200-62.5-25: 200-62.5-25 | 30 days supply | Qty: 60 | Fill #0

## 2020-07-19 NOTE — Patient Instructions (Addendum)
Today's skin testing showed: Positive to grass, weed, ragweed, trees, dust mites. Borderline to feathers.   Asthma/COPD:  . Think about quitting smoking.  . Daily controller medication(s): start Trelegy 239mcg 1 puff once a day. Sample given.  o This replaces Advair for now.  . May use albuterol rescue inhaler 2 puffs or nebulizer every 4 to 6 hours as needed for shortness of breath, chest tightness, coughing, and wheezing. May use albuterol rescue inhaler 2 puffs 5 to 15 minutes prior to strenuous physical activities. Monitor frequency of use.  . Asthma control goals:  o Full participation in all desired activities (may need albuterol before activity) o Albuterol use two times or less a week on average (not counting use with activity) o Cough interfering with sleep two times or less a month o Oral steroids no more than once a year o No hospitalizations  Environmental allergies  Start environmental control measures as below. Start Singulair (montelukast) 10mg  daily at night. Cautioned that in some children/adults can experience behavioral changes including hyperactivity, agitation, depression, sleep disturbances and suicidal ideations. These side effects are rare, but if you notice them you should notify me and discontinue Singulair (montelukast).  May use over the counter antihistamines such as Zyrtec (cetirizine), Claritin (loratadine), Allegra (fexofenadine), or Xyzal (levocetirizine) daily as needed.  Follow up in 2 months or sooner if needed.   Reducing Pollen Exposure . Pollen seasons: trees (spring), grass (summer) and ragweed/weeds (fall). Marland Kitchen Keep windows closed in your home and car to lower pollen exposure.  Susa Simmonds air conditioning in the bedroom and throughout the house if possible.  . Avoid going out in dry windy days - especially early morning. . Pollen counts are highest between 5 - 10 AM and on dry, hot and windy days.  . Save outside activities for late afternoon or  after a heavy rain, when pollen levels are lower.  . Avoid mowing of grass if you have grass pollen allergy. Marland Kitchen Be aware that pollen can also be transported indoors on people and pets.  . Dry your clothes in an automatic dryer rather than hanging them outside where they might collect pollen.  . Rinse hair and eyes before bedtime. Control of House Dust Mite Allergen . Dust mite allergens are a common trigger of allergy and asthma symptoms. While they can be found throughout the house, these microscopic creatures thrive in warm, humid environments such as bedding, upholstered furniture and carpeting. . Because so much time is spent in the bedroom, it is essential to reduce mite levels there.  . Encase pillows, mattresses, and box springs in special allergen-proof fabric covers or airtight, zippered plastic covers.  . Bedding should be washed weekly in hot water (130 F) and dried in a hot dryer. Allergen-proof covers are available for comforters and pillows that can't be regularly washed.  Wendee Copp the allergy-proof covers every few months. Minimize clutter in the bedroom. Keep pets out of the bedroom.  Marland Kitchen Keep humidity less than 50% by using a dehumidifier or air conditioning. You can buy a humidity measuring device called a hygrometer to monitor this.  . If possible, replace carpets with hardwood, linoleum, or washable area rugs. If that's not possible, vacuum frequently with a vacuum that has a HEPA filter. . Remove all upholstered furniture and non-washable window drapes from the bedroom. . Remove all non-washable stuffed toys from the bedroom.  Wash stuffed toys weekly.

## 2020-07-19 NOTE — Assessment & Plan Note (Signed)
Perennial rhinoconjunctivitis symptoms for 30 years with worsening in the spring.  Tried Claritin and Benadryl with some benefit.  No prior allergy/ENT evaluation.  Today's skin testing showed: Positive to grass, weed, ragweed, trees, dust mites. Borderline to feathers.   Start environmental control measures as below. Start Singulair (montelukast) 10mg  daily at night. Cautioned that in some children/adults can experience behavioral changes including hyperactivity, agitation, depression, sleep disturbances and suicidal ideations. These side effects are rare, but if you notice them you should notify me and discontinue Singulair (montelukast).  May use over the counter antihistamines such as Zyrtec (cetirizine), Claritin (loratadine), Allegra (fexofenadine), or Xyzal (levocetirizine) daily as needed.  Declines nasal sprays and eyedrops for now.

## 2020-07-19 NOTE — Assessment & Plan Note (Signed)
   See assessment and plan as above for allergic rhinitis.  

## 2020-07-19 NOTE — Assessment & Plan Note (Signed)
Diagnosed with asthma 8 years ago but symptoms worse the last year.  Currently on Advair 250 mcg 1 puff twice a day and albuterol 3 times a day with some benefit.  Main triggers include allergies, infections and weather changes.  3 courses of prednisone within the past year. Patient is a current smoker.  Takes omeprazole for reflux.  Today's spirometry consistent with mixed restriction and obstruction with no improvement in FEV1 post bronchodilator treatment. Clinically feeling improved and wheezing resolved.  . Discussed smoking cessation.  . Given her history of smoking there may be a component of COPD contributing to her respiratory symptoms.  . Daily controller medication(s): start Trelegy 275mcg 1 puff once a day. Sample given.  o This replaces Advair for now.  . May use albuterol rescue inhaler 2 puffs or nebulizer every 4 to 6 hours as needed for shortness of breath, chest tightness, coughing, and wheezing. May use albuterol rescue inhaler 2 puffs 5 to 15 minutes prior to strenuous physical activities. Monitor frequency of use.  . Repeat spirometry at next visit. . If no improvement in symptoms will consider adding biologics treatment next.

## 2020-07-24 MED FILL — ?METOPROLOL TART 50 MG TABL: 50 | 30 days supply | Qty: 60 | Fill #0

## 2020-07-24 MED FILL — ?ACYCLOVIR 200 MG CAPSU: 200 | 30 days supply | Qty: 60 | Fill #8

## 2020-08-03 NOTE — Progress Notes (Signed)
Office Visit Note  Patient: Sydney Williams             Date of Birth: 09/18/1966           MRN: [id number]             PCP: Elsie Stain, MD Referring: Leandrew Koyanagi, MD Visit Date: 08/04/2020 Occupation: Nurse assistant  Subjective:  Edema, Pain, and Numbness of the Right Hand and Edema, Pain, and Numbness of the Left Hand   History of Present Illness: Sydney Williams is a 54 y.o. female here for evaluation for bilateral wrist pain and elevated ESR.  She is experiencing bilateral hand pain and swelling that started about 2 months ago.  She was evaluated in August felt to have symptoms of carpal tunnel syndrome however the presence of finger swelling and elevated ESR were concerning for inflammatory arthritis.  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  She has some longstanding chronic low back pain that has not changed during this time and prior evaluation found degenerative disc disease.  No other recent health events besides a root canal at which time she took antibiotics but this was after the onset of her symptoms.  She denies a personal or family history of inflammatory arthritis to her knowledge.  She denies a personal history of inflammatory bowel disease.  She denies any skin rashes.  She does report a history of gout but never experiencing this distribution and duration of symptoms.   RF negative CCP negative ESR 99  Xrays of bilateral hands reviewed from 06/21/20 Juxtaarticular osteopenia is present bilaterally, also cystic changes present but joint space preserved and no soft tissue swelling  Activities of Daily Living:  Patient reports morning stiffness for 24 hours.   Patient Reports nocturnal pain.  Difficulty dressing/grooming: Reports Difficulty climbing stairs: Reports Difficulty getting out of chair: Reports Difficulty using hands for taps, buttons, cutlery,  and/or writing: Reports  Review of Systems  Constitutional: Positive for fatigue.  HENT: Positive for mouth dryness. Negative for mouth sores and nose dryness.   Eyes: Positive for itching. Negative for pain, visual disturbance and dryness.  Respiratory: Negative for cough, hemoptysis, shortness of breath and difficulty breathing.   Cardiovascular: Positive for swelling in legs/feet. Negative for chest pain and palpitations.  Gastrointestinal: Negative for abdominal pain, blood in stool, constipation and diarrhea.  Endocrine: Negative for increased urination.  Genitourinary: Negative for painful urination.  Musculoskeletal: Positive for arthralgias, joint pain, joint swelling and morning stiffness. Negative for myalgias, muscle weakness, muscle tenderness and myalgias.  Skin: Negative for color change, rash and redness.  Allergic/Immunologic: Negative for susceptible to infections.  Neurological: Positive for numbness, headaches and weakness. Negative for dizziness and memory loss.       Patient complains of finger numbness  Psychiatric/Behavioral: Positive for sleep disturbance. Negative for depressed mood and confusion. The patient is not nervous/anxious.     PMFS History:  Patient Active Problem List   Diagnosis Date Noted  . Bilateral hand pain 08/04/2020  . Allergic conjunctivitis of both eyes 07/19/2020  . Lumbar radiculopathy, chronic 04/03/2020  . Elevated blood uric acid level 04/03/2020  . Family history of cervical cancer 02/02/2018  . Family history of breast cancer 02/02/2018  . Vitamin D deficiency 02/02/2018  . Depression 06/19/2017  . Hypomagnesemia 06/19/2017  . 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 Snoring 09/11/2016  .  History of migraine 09/11/2016  . Frequent headaches 09/11/2016  . Encounter for health maintenance examination in adult 12/04/2015  . Moderate persistent asthma 06/01/2015  . Gastroesophageal reflux disease without esophagitis  06/01/2015  . Other allergic rhinitis 06/01/2015  . Genital herpes 11/30/2014  . Tobacco user 11/30/2014  . Essential hypertension 11/30/2014  . Hyperlipidemia 11/30/2014  . Impaired fasting blood sugar 11/30/2014  . Not well controlled asthma without complication 43/88/8757  . Obesity 07/02/2012  . Diabetes mellitus without complication ([place]) 97/28/2060    Past Medical History:  Diagnosis Date  . Asthma    2015 hospitaliation for asthma  . Diabetes mellitus    vs impaired fasting glucose  . Family history of breast cancer   . Fibroids 2010  . Genital herpes   . GERD (gastroesophageal reflux disease)   . Hyperlipidemia   . Hypertension   . Impaired fasting blood sugar   . Migraines   . Obesity   . Smoker     Family History  Problem Relation Age of Onset  . Diabetes Mother   . Hypertension Mother   . Aneurysm Mother   . Stroke Mother   . Cancer Mother        cervical cancer  . Cancer Maternal Aunt        breast  . Cancer Cousin        breast/breast  . Lupus Cousin   . Heart disease Neg Hx   . Colon cancer Neg Hx   . Allergic rhinitis Neg Hx   . Angioedema Neg Hx   . Asthma Neg Hx   . Atopy Neg Hx   . Eczema Neg Hx   . Immunodeficiency Neg Hx   . Urticaria Neg Hx    Past Surgical History:  Procedure Laterality Date  . COLONOSCOPY  03/13/2016   Dr. Carlean Purl, normal, repeat 2027  . CYST EXCISION     left neck/postauricular region, benign  . LAPAROSCOPIC ABDOMINAL EXPLORATION     removal of ectopic preg  . LEFT HEART CATH AND CORONARY ANGIOGRAPHY N/A 06/23/2017   Procedure: LEFT HEART CATH AND CORONARY ANGIOGRAPHY;  Surgeon: Nelva Bush, MD;  Location: [facility];  Service: Cardiovascular;  Laterality: N/A;  . TUBAL LIGATION     Social History   Social History Narrative   Married, and her granddaughter lives with her, works as Quarry manager at [employer], exercise - activity at work, walking.  12/2016   Immunization History  Administered Date(s)  Administered  . Influenza,inj,Quad PF,6+ Mos 12/04/2015, 01/08/2017  . Influenza-Unspecified 08/28/2018  . PFIZER SARS-COV-2 Vaccination 01/27/2020, 02/21/2020  . PPD Test 07/27/2019  . Tdap 11/30/2014     Objective: Vital Signs: BP 139/90 (BP Location: Left Arm, Patient Position: Sitting, Cuff Size: Small)   Pulse 81   Ht '5\' 9"'  (1.753 m)   Wt 279 lb 6.4 oz (126.7 kg)   LMP 06/29/2015 (Exact Date)   BMI 41.26 kg/m    Physical Exam HENT:     Right Ear: External ear normal.     Left Ear: External ear normal.  Eyes:     Conjunctiva/sclera: Conjunctivae normal.     Pupils: Pupils are equal, round, and reactive to light.  Neurological:     Mental Status: She is alert.      Musculoskeletal Exam:  Neck full range of motion no tenderness Shoulder pain with active range of motion but no swelling or tenderness Elbows full range of motion no tenderness or swelling Wrists full  range of motion but some soft tissue thickening or swelling over hyperthenar eminence and ulnar aspect of joint, no effusion Right fourth MCP swelling present, tenderness over fourth and fifth MCP, fourth PIP. Left fourth MCP tenderness without overt swelling.  Inability to close fist completely on either hand though grip strength is fair. No paraspinal tenderness to palpation over upper and lower back Normal hip internal and external rotation without pain, no tenderness to lateral hip palpation Knees, ankles, MTPs full range of motion no tenderness or swelling   CDAI Exam: CDAI Score: 6.9  Patient Global: 5 mm; Provider Global: 4 mm Swollen: 1 ; Tender: 5  Joint Exam 08/04/2020      Right  Left  MCP 4  Swollen Tender   Tender  MCP 5   Tender     PIP 4   Tender   Tender     Investigation: No additional findings.  Imaging: No results found.  Recent Labs: Lab Results  Component Value Date   WBC 6.0 06/06/2020   HGB 13.7 06/06/2020   PLT 258 06/06/2020   NA 142 06/06/2020   K 4.0 06/06/2020    CL 98 06/06/2020   CO2 23 06/06/2020   GLUCOSE 83 06/06/2020   BUN 11 06/06/2020   CREATININE 0.69 06/06/2020   BILITOT 0.2 06/06/2020   ALKPHOS 125 (H) 06/06/2020   AST 21 06/06/2020   ALT 23 06/06/2020   PROT 7.7 06/06/2020   ALBUMIN 4.1 06/06/2020   CALCIUM 9.4 06/06/2020   GFRAA 113 06/06/2020    Speciality Comments: No specialty comments available.  Procedures:  No procedures performed Allergies: Lisinopril, Potassium chloride, Pneumococcal vaccines, and Vicodin [hydrocodone-acetaminophen]   Assessment / Plan:     Visit Diagnoses: Bilateral hand pain - Plan: Sedimentation rate, C-reactive protein  Hand pain 2 months duration with maximum symptom severity around 1 hour morning stiffness and swelling is present on exam today this is consistent with inflammatory arthritis.  Negative serology for rheumatoid arthritis so we will definitely need to consider secondary causes.  This is not similar to the history of gout and symptoms have not resolved despite weeks.  She denies all review of systems associated with seronegative spondyloarthritides.  Infectious arthritis unlikely with polyarticular presentation.  Do agree with the presence of carpal tunnel syndrome but does not by itself explain the identified changes. We will repeat inflammatory markers today Discussed use of NSAIDs for symptoms Discussed starting methotrexate at next visit if symptoms are not improving with conservative management  Elevated blood uric acid level - Plan: Uric acid Last uric acid level 6.6 on allopurinol 100 mg daily.  Chronic gout possible to develop a pseudo-RA presentation but does not seem likely from history at this time, if large joint effusions develop can aspirate to assess. Repeat uric acid today  High risk medication use - Plan: Hepatitis panel, acute, QuantiFERON-TB Gold Plus, CBC with Differential/Platelet, COMPLETE METABOLIC PANEL WITH GFR We will obtain baseline CBC, CMP for therapeutic  drug monitoring Checking hepatitis panel and QuantiFERON most likely considering starting methotrexate if symptoms are persisting Review chest x-ray from March 2021 that was normal, did not show any evidence of ILD or nodules.contraindicate DMARDs  Orders: Orders Placed This Encounter  Procedures  . Sedimentation rate  . C-reactive protein  . Hepatitis panel, acute  . QuantiFERON-TB Gold Plus  . CBC with Differential/Platelet  . COMPLETE METABOLIC PANEL WITH GFR  . Uric acid   No orders of the defined types were placed in  this encounter.   Follow-Up Instructions: No follow-ups on file.   Collier Salina, MD  Note - This record has been created using [employer].  Chart creation errors have been sought, but may not always  have been located. Such creation errors do not reflect on  the standard of medical care.

## 2020-08-04 ENCOUNTER — Ambulatory Visit (INDEPENDENT_AMBULATORY_CARE_PROVIDER_SITE_OTHER): Payer: Self-pay | Admitting: Internal Medicine

## 2020-08-04 ENCOUNTER — Encounter: Payer: Self-pay | Admitting: Internal Medicine

## 2020-08-04 ENCOUNTER — Other Ambulatory Visit: Payer: Self-pay

## 2020-08-04 VITALS — BP 139/90 | HR 81 | Ht 69.0 in | Wt 279.4 lb

## 2020-08-04 DIAGNOSIS — M0600 Rheumatoid arthritis without rheumatoid factor, unspecified site: Secondary | ICD-10-CM | POA: Insufficient documentation

## 2020-08-04 DIAGNOSIS — M79641 Pain in right hand: Secondary | ICD-10-CM

## 2020-08-04 DIAGNOSIS — E790 Hyperuricemia without signs of inflammatory arthritis and tophaceous disease: Secondary | ICD-10-CM

## 2020-08-04 DIAGNOSIS — M79642 Pain in left hand: Secondary | ICD-10-CM

## 2020-08-04 DIAGNOSIS — Z79899 Other long term (current) drug therapy: Secondary | ICD-10-CM

## 2020-08-04 NOTE — Patient Instructions (Signed)
Your current symptoms indicate an inflammatory arthritis in the hands. Your antibodies for rheumatoid arthritis are negative. These are not always positive but does mean other sources of inflammation may be responsible.  I am repeating your uric acid level for gout to see if it is still at goal.  In the short term you may try using NSAIDs such as ibuprofen, or topical form such as voltaren gel.  If your symptoms are not improving quickly between now and follow up, I will recommend starting treatment with medication for rheumatoid arthritis, such as methotrexate. We are drawing some tests now to monitor that this medication will be safe and you can review more information about this too.      Rheumatoid Arthritis Rheumatoid arthritis (RA) is a long-term (chronic) disease that causes inflammation in your joints. RA may start slowly. It most often affects the small joints of the hands and feet. Usually, the same joints are affected on both sides of your body. Inflammation from RA can also affect other parts of your body, including your heart, eyes, or lungs. There is no cure for RA, but medicines can help your symptoms and halt or slow down the progression of the disease. What are the causes? RA is an autoimmune disease. When you have an autoimmune disease, your body's defense system (immune system) mistakenly attacks healthy body tissues. The exact cause of RA is not known. What increases the risk? You are more likely to develop this condition if you:  Are a woman.  Have a family history of RA or other autoimmune diseases.  Have a history of smoking.  Are obese.  Have been exposed to pollutants or chemicals. What are the signs or symptoms? The first symptom of this condition may be morning stiffness that lasts longer than 30 minutes.  Symptoms usually start gradually. They are often worse in the morning. As RA progresses, symptoms may include:  Pain, stiffness, swelling, warmth, and  tenderness in joints on both sides of your body.  Loss of energy.  Loss of appetite.  Weight loss.  Low-grade fever.  Dry eyes and dry mouth.  Firm lumps (rheumatoid nodules) that grow beneath your skin in areas such as your forearm bones near your elbows and on your hands.  Changes in the appearance of joints (deformity) and loss of joint function. Symptoms of this condition vary from person to person.  Symptoms of RA often come and go.  Sometimes, symptoms get worse for a period of time. These are called flares. How is this diagnosed? This condition is diagnosed based on your symptoms, medical history, and physical exam.  You may have X-rays or an MRI to check for the type of joint changes that are caused by RA. You may also have blood tests to look for:  Proteins (antibodies) that your immune system may make if you have RA. These include rheumatoid factor (RF) and anti-CCP. ? When blood tests show these proteins, you are said to have "seropositive RA." ? When blood tests do not show these proteins, you may have "seronegative RA."  Inflammation in your blood.  A low number of red blood cells (anemia). How is this treated? The goals of treatment are to relieve pain, reduce inflammation, and slow down or stop joint damage and disability. Treatment may include:  Lifestyle changes. It is important to rest as needed, eat a healthy diet, and exercise.  Medicines. Your health care provider may adjust your medicines every 3 months until treatment goals are reached.  Common medicines include: ? Pain relievers (analgesics). ? Corticosteroids and NSAIDs to reduce inflammation. ? Disease-modifying antirheumatic drugs (DMARDs) to try to slow the course of the disease. ? Biologic response modifiers to reduce inflammation and damage.  Physical therapy and occupational therapy.  Surgery, if you have severe joint damage. Joint replacement or fusing of joints may be needed. Your health  care provider will work with you to identify the best treatment option for you based on assessment of the overall disease activity in your body. Follow these instructions at home: Activity  Return to your normal activities as told by your health care provider. Ask your health care provider what activities are safe for you.  Rest when you are having a flare.  Start an exercise program as told by your health care provider. General instructions  Keep all follow-up visits as told by your health care provider. This is important.  Take over-the-counter and prescription medicines only as told by your health care provider. Where to find more information  [employer] of Rheumatology: www.rheumatology.[place]: www.arthritis.org Contact a health care provider if:  You have a flare-up of RA symptoms.  You have a fever.  You have side effects from your medicines. Get help right away if:  You have chest pain.  You have trouble breathing.  You quickly develop a hot, painful joint that is more severe than your usual joint aches. Summary  Rheumatoid arthritis (RA) is a long-term (chronic) disease that causes inflammation in your joints.  RA is an autoimmune disease.  The goals of treatment are to relieve pain, reduce inflammation, and slow down or stop joint damage and disability. This information is not intended to replace advice given to you by your health care provider. Make sure you discuss any questions you have with your health care provider. Document Revised: 04/27/2019 Document Reviewed: 06/16/2018 Elsevier Patient Education  [place].       Methotrexate tablets What is this medicine? METHOTREXATE (METH oh TREX ate) is a chemotherapy drug used to treat cancer including breast cancer, leukemia, and lymphoma. This medicine can also be used to treat psoriasis and certain kinds of arthritis. This medicine may be used for other purposes; ask your  health care provider or pharmacist if you have questions. COMMON BRAND NAME(S): Rheumatrex, Trexall What should I tell my health care provider before I take this medicine? They need to know if you have any of these conditions:  fluid in the stomach area or lungs  if you often drink alcohol  infection or immune system problems  kidney disease or on hemodialysis  liver disease  low blood counts, like low white cell, platelet, or red cell counts  lung disease  radiation therapy  stomach ulcers  ulcerative colitis  an unusual or allergic reaction to methotrexate, other medicines, foods, dyes, or preservatives  pregnant or trying to get pregnant  breast-feeding How should I use this medicine? Take this medicine by mouth with a glass of water. Follow the directions on the prescription label. Take your medicine at regular intervals. Do not take it more often than directed. Do not stop taking except on your doctor's advice. Make sure you know why you are taking this medicine and how often you should take it. If this medicine is used for a condition that is not cancer, like arthritis or psoriasis, it should be taken weekly, NOT daily. Taking this medicine more often than directed can cause serious side effects, even death. Talk to  your healthcare provider about safe handling and disposal of this medicine. You may need to take special precautions. Talk to your pediatrician regarding the use of this medicine in children. While this drug may be prescribed for selected conditions, precautions do apply. Overdosage: If you think you have taken too much of this medicine contact a poison control center or emergency room at once. NOTE: This medicine is only for you. Do not share this medicine with others. What if I miss a dose? If you miss a dose, talk with your doctor or health care professional. Do not take double or extra doses. What may interact with this medicine? This medicine may  interact with the following medication:  acitretin  aspirin and aspirin-like medicines including salicylates  azathioprine  certain antibiotics like penicillins, tetracycline, and chloramphenicol  cyclosporine  gold  hydroxychloroquine  live virus vaccines  NSAIDs, medicines for pain and inflammation, like ibuprofen or naproxen  other cytotoxic agents  penicillamine  phenylbutazone  phenytoin  probenecid  retinoids such as isotretinoin and tretinoin  steroid medicines like prednisone or cortisone  sulfonamides like sulfasalazine and trimethoprim/sulfamethoxazole  theophylline This list may not describe all possible interactions. Give your health care provider a list of all the medicines, herbs, non-prescription drugs, or dietary supplements you use. Also tell them if you smoke, drink alcohol, or use illegal drugs. Some items may interact with your medicine. What should I watch for while using this medicine? Avoid alcoholic drinks. This medicine can make you more sensitive to the sun. Keep out of the sun. If you cannot avoid being in the sun, wear protective clothing and use sunscreen. Do not use sun lamps or tanning beds/booths. You may need blood work done while you are taking this medicine. Call your doctor or health care professional for advice if you get a fever, chills or sore throat, or other symptoms of a cold or flu. Do not treat yourself. This drug decreases your body's ability to fight infections. Try to avoid being around people who are sick. This medicine may increase your risk to bruise or bleed. Call your doctor or health care professional if you notice any unusual bleeding. Check with your doctor or health care professional if you get an attack of severe diarrhea, nausea and vomiting, or if you sweat a lot. The loss of too much body fluid can make it dangerous for you to take this medicine. Talk to your doctor about your risk of cancer. You may be more at  risk for certain types of cancers if you take this medicine. Both men and women must use effective birth control with this medicine. Do not become pregnant while taking this medicine or until at least 1 normal menstrual cycle has occurred after stopping it. Women should inform their doctor if they wish to become pregnant or think they might be pregnant. Men should not father a child while taking this medicine and for 3 months after stopping it. There is a potential for serious side effects to an unborn child. Talk to your health care professional or pharmacist for more information. Do not breast-feed an infant while taking this medicine. What side effects may I notice from receiving this medicine? Side effects that you should report to your doctor or health care professional as soon as possible:  allergic reactions like skin rash, itching or hives, swelling of the face, lips, or tongue  breathing problems or shortness of breath  diarrhea  dry, nonproductive cough  low blood counts - this  medicine may decrease the number of white blood cells, red blood cells and platelets. You may be at increased risk for infections and bleeding.  mouth sores  redness, blistering, peeling or loosening of the skin, including inside the mouth  signs of infection - fever or chills, cough, sore throat, pain or trouble passing urine  signs and symptoms of bleeding such as bloody or black, tarry stools; red or dark-brown urine; spitting up blood or brown material that looks like coffee grounds; red spots on the skin; unusual bruising or bleeding from the eye, gums, or nose  signs and symptoms of kidney injury like trouble passing urine or change in the amount of urine  signs and symptoms of liver injury like dark yellow or brown urine; general ill feeling or flu-like symptoms; light-colored stools; loss of appetite; nausea; right upper belly pain; unusually weak or tired; yellowing of the eyes or skin Side effects  that usually do not require medical attention (report to your doctor or health care professional if they continue or are bothersome):  dizziness  hair loss  tiredness  upset stomach  vomiting This list may not describe all possible side effects. Call your doctor for medical advice about side effects. You may report side effects to FDA at 1-800-FDA-1088. Where should I keep my medicine? Keep out of the reach of children. Store at room temperature between 20 and 25 degrees C (68 and 77 degrees F). Protect from light. Throw away any unused medicine after the expiration date. NOTE: This sheet is a summary. It may not cover all possible information. If you have questions about this medicine, talk to your doctor, pharmacist, or health care provider.  2020 Elsevier/Gold Standard (2017-06-05 13:38:43)

## 2020-08-07 LAB — COMPLETE METABOLIC PANEL WITH GFR
AG Ratio: 1.2 (calc) (ref 1.0–2.5)
ALT: 19 U/L (ref 6–29)
AST: 17 U/L (ref 10–35)
Albumin: 4.1 g/dL (ref 3.6–5.1)
Alkaline phosphatase (APISO): 105 U/L (ref 37–153)
BUN: 10 mg/dL (ref 7–25)
CO2: 31 mmol/L (ref 20–32)
Calcium: 9.6 mg/dL (ref 8.6–10.4)
Chloride: 102 mmol/L (ref 98–110)
Creat: 0.52 mg/dL (ref 0.50–1.05)
GFR, Est African American: 125 mL/min/{1.73_m2} (ref >=60)
GFR, Est Non African American: 108 mL/min/{1.73_m2} (ref >=60)
Globulin: 3.4 g/dL (calc) (ref 1.9–3.7)
Glucose, Bld: 102 mg/dL — ABNORMAL HIGH (ref 65–99)
Potassium: 3.4 mmol/L — ABNORMAL LOW (ref 3.5–5.3)
Sodium: 142 mmol/L (ref 135–146)
Total Bilirubin: 0.3 mg/dL (ref 0.2–1.2)
Total Protein: 7.5 g/dL (ref 6.1–8.1)

## 2020-08-07 LAB — CBC WITH DIFFERENTIAL/PLATELET
Absolute Monocytes: 569 cells/uL (ref 200–950)
Basophils Absolute: 40 cells/uL (ref 0–200)
Basophils Relative: 0.5 %
Eosinophils Absolute: 198 cells/uL (ref 15–500)
Eosinophils Relative: 2.5 %
HCT: 40.3 % (ref 35.0–45.0)
Hemoglobin: 13.7 g/dL (ref 11.7–15.5)
Lymphs Abs: 2520 cells/uL (ref 850–3900)
MCH: 31.4 pg (ref 27.0–33.0)
MCHC: 34 g/dL (ref 32.0–36.0)
MCV: 92.2 fL (ref 80.0–100.0)
MPV: 11.4 fL (ref 7.5–12.5)
Monocytes Relative: 7.2 %
Neutro Abs: 4574 cells/uL (ref 1500–7800)
Neutrophils Relative %: 57.9 %
Platelets: 302 10*3/uL (ref 140–400)
RBC: 4.37 10*6/uL (ref 3.80–5.10)
RDW: 13.7 % (ref 11.0–15.0)
Total Lymphocyte: 31.9 %
WBC: 7.9 10*3/uL (ref 3.8–10.8)

## 2020-08-07 LAB — QUANTIFERON-TB GOLD PLUS
Mitogen-NIL: 8.3 IU/mL
NIL: 0.05 IU/mL
QuantiFERON-TB Gold Plus: NEGATIVE
TB1-NIL: 0 IU/mL
TB2-NIL: 0 IU/mL

## 2020-08-07 LAB — HEPATITIS PANEL, ACUTE
Hep A IgM: NONREACTIVE
Hep B C IgM: NONREACTIVE
Hepatitis B Surface Ag: NONREACTIVE
Hepatitis C Ab: NONREACTIVE
SIGNAL TO CUT-OFF: 0.02 (ref <1.00)

## 2020-08-07 LAB — C-REACTIVE PROTEIN: CRP: 21 mg/L — ABNORMAL HIGH (ref <8.0)

## 2020-08-07 LAB — URIC ACID: Uric Acid, Serum: 6.4 mg/dL (ref 2.5–7.0)

## 2020-08-07 LAB — SEDIMENTATION RATE: Sed Rate: 58 mm/h — ABNORMAL HIGH (ref 0–30)

## 2020-08-08 MED FILL — HYDROCHLOROTHIAZIDE 25 MG T: 25 | 30 days supply | Qty: 30 | Fill #3

## 2020-08-08 MED FILL — ?ATORVASTATIN 40MG TABLET: 40 | 30 days supply | Qty: 30 | Fill #1

## 2020-08-08 MED FILL — ?ALLOPURINOL 100MG TABLET: 100 | 30 days supply | Qty: 30 | Fill #3

## 2020-08-08 MED FILL — AMLODIPINE BESYLATE 10 MG T: 10 | 30 days supply | Qty: 30 | Fill #0

## 2020-08-10 MED FILL — OMEPRAZOLE DR 40 MG CAPSULE: 40 | 30 days supply | Qty: 60 | Fill #2

## 2020-08-11 ENCOUNTER — Ambulatory Visit (INDEPENDENT_AMBULATORY_CARE_PROVIDER_SITE_OTHER): Payer: Medicaid Other | Admitting: Physical Medicine and Rehabilitation

## 2020-08-11 ENCOUNTER — Other Ambulatory Visit: Payer: Self-pay

## 2020-08-11 DIAGNOSIS — R202 Paresthesia of skin: Secondary | ICD-10-CM

## 2020-08-11 NOTE — Progress Notes (Signed)
Pt state her hands didn't bend. Pt state both shoulder is giving her pain. Pt state she feels numbness and tingling in both hands. Pt state she has hard time opening things. Right hand dominate.    Numeric Pain Rating Scale and Functional Assessment Average Pain 7   In the last MONTH (on 0-10 scale) has pain interfered with the following?  1. General activity like being  able to carry out your everyday physical activities such as walking, climbing stairs, carrying groceries, or moving a chair?  Rating(10)

## 2020-08-15 NOTE — Progress Notes (Signed)
Sydney Williams - 54 y.o. female MRN [id number]  Date of birth: 1965-12-27  Office Visit Note: Visit Date: 08/11/2020 PCP: Elsie Stain, MD Referred by: Elsie Stain, MD  Subjective: Chief Complaint  Patient presents with  . Right Hand - Pain  . Left Hand - Pain   HPI: Sydney Williams is a 54 y.o. female who comes in today at the request of Dr. Eduard Roux for electrodiagnostic study of the Bilateral upper extremities.  Patient is Right hand dominant.  She reports prolonged several months of progressive worsening hand pain and stiffness with somewhat nondermatomal tingling somewhat more radial digits.  She reports right hand worse than left but both are really about equal.  She reports some pain with both shoulders as well.  No frank radicular symptoms down the arms.  She does have diabetes with last hemoglobin A1c of 6.8.  Dr. Erlinda Hong also looked at rheumatologic labs and she did have a very high sed rate.  Since that time she has seen Dr. Benjamine Mola for rheumatologic work-up and is continuing to see him.  She has no prior electrodiagnostic studies.   ROS Otherwise per HPI.  Assessment & Plan: Visit Diagnoses:  1. Paresthesia of skin     Plan:  Impression: The above electrodiagnostic study is ABNORMAL and reveals evidence of a moderate BILATERAL median nerve entrapment at the wrists (carpal tunnel syndrome) affecting sensory and motor components.   There is no significant electrodiagnostic evidence of any other focal nerve entrapment, brachial plexopathy or cervical radiculopathy.   Recommendations: 1.  Follow-up with referring physician. 2.  Continue current management of symptoms. 3.  Continue use of resting splint at night-time and as needed during the day. 4.  Suggest surgical evaluation.  Meds & Orders: No orders of the defined types were placed in this encounter.   Orders Placed This Encounter  Procedures  . NCV with EMG (electromyography)    Follow-up: Return for  Eduard Roux, M.D..   Procedures: No procedures performed  EMG & NCV Findings: Evaluation of the left median motor and the right median motor nerves showed prolonged distal onset latency (L4.5, R4.6 ms) and decreased conduction velocity (Elbow-Wrist, L47, R47 m/s).  The left median (across palm) sensory nerve showed prolonged distal peak latency (Wrist, 4.7 ms) and prolonged distal peak latency (Palm, 12.8 ms).  The right median (across palm) sensory nerve showed no response (Palm), prolonged distal peak latency (5.0 ms), and reduced amplitude (5.8 V).  All remaining nerves (as indicated in the following tables) were within normal limits.  All left vs. right side differences were within normal limits.    All examined muscles (as indicated in the following table) showed no evidence of electrical instability.    Impression: The above electrodiagnostic study is ABNORMAL and reveals evidence of a moderate BILATERAL median nerve entrapment at the wrists (carpal tunnel syndrome) affecting sensory and motor components.   There is no significant electrodiagnostic evidence of any other focal nerve entrapment, brachial plexopathy or cervical radiculopathy.   Recommendations: 1.  Follow-up with referring physician. 2.  Continue current management of symptoms. 3.  Continue use of resting splint at night-time and as needed during the day. 4.  Suggest surgical evaluation.  ___________________________ Wonda Olds Board Certified, American Board of Physical Medicine and Rehabilitation    Nerve Conduction Studies Anti Sensory Summary Table   Stim Site NR Peak (ms) Norm Peak (ms) P-T Amp (V) Norm P-T Amp Site1 Site2 Delta-P (ms)  Dist (cm) Vel (m/s) Norm Vel (m/s)  Left Median Acr Palm Anti Sensory (2nd Digit)  31.3C  Wrist    *4.7 <3.6 13.3 >10 Wrist Palm 8.1 0.0    Palm    *12.8 <2.0 5.4         Right Median Acr Palm Anti Sensory (2nd Digit)  30.8C  Wrist    *5.0 <3.6 *5.8 >10 Wrist Palm  0.0      Palm *NR  <2.0          Left Radial Anti Sensory (Base 1st Digit)  31C  Wrist    2.2 <3.1 16.1  Wrist Base 1st Digit 2.2 0.0    Right Radial Anti Sensory (Base 1st Digit)  30.9C  Wrist    2.2 <3.1 11.4  Wrist Base 1st Digit 2.2 0.0    Left Ulnar Anti Sensory (5th Digit)  31.6C  Wrist    3.3 <3.7 17.3 >15.0 Wrist 5th Digit 3.3 14.0 42 >38  Right Ulnar Anti Sensory (5th Digit)  31.1C  Wrist    3.4 <3.7 17.5 >15.0 Wrist 5th Digit 3.4 14.0 41 >38   Motor Summary Table   Stim Site NR Onset (ms) Norm Onset (ms) O-P Amp (mV) Norm O-P Amp Site1 Site2 Delta-0 (ms) Dist (cm) Vel (m/s) Norm Vel (m/s)  Left Median Motor (Abd Poll Brev)  31.4C  Wrist    *4.5 <4.2 7.6 >5 Elbow Wrist 5.3 25.0 *47 >50  Elbow    9.8  3.2         Right Median Motor (Abd Poll Brev)  31.2C  Wrist    *4.6 <4.2 5.7 >5 Elbow Wrist 5.2 24.5 *47 >50  Elbow    9.8  2.0         Left Ulnar Motor (Abd Dig Min)  31.4C  Wrist    3.0 <4.2 7.8 >3 B Elbow Wrist 4.0 22.0 55 >53  B Elbow    7.0  8.1  A Elbow B Elbow 1.9 10.0 53 >53  A Elbow    5.1  7.9         Right Ulnar Motor (Abd Dig Min)  31.4C  Wrist    3.0 <4.2 9.4 >3 B Elbow Wrist 4.0 22.0 55 >53  B Elbow    7.0  4.5  A Elbow B Elbow 1.6 10.0 63 >53  A Elbow    8.6  9.6          EMG   Side Muscle Nerve Root Ins Act Fibs Psw Amp Dur Poly Recrt Int Fraser Din Comment  Right Abd Poll Brev Median C8-T1 Nml Nml Nml Nml Nml 0 Nml Nml   Right 1stDorInt Ulnar C8-T1 Nml Nml Nml Nml Nml 0 Nml Nml   Right PronatorTeres Median C6-7 Nml Nml Nml Nml Nml 0 Nml Nml   Right Biceps Musculocut C5-6 Nml Nml Nml Nml Nml 0 Nml Nml   Right Deltoid Axillary C5-6 Nml Nml Nml Nml Nml 0 Nml Nml     Nerve Conduction Studies Anti Sensory Left/Right Comparison   Stim Site L Lat (ms) R Lat (ms) L-R Lat (ms) L Amp (V) R Amp (V) L-R Amp (%) Site1 Site2 L Vel (m/s) R Vel (m/s) L-R Vel (m/s)  Median Acr Palm Anti Sensory (2nd Digit)  31.3C  Wrist *4.7 *5.0 0.3 13.3 *5.8 56.4 Wrist Palm     Palm  *12.8   5.4         Radial Anti Sensory (Base 1st Digit)  31C  Wrist 2.2 2.2 0.0 16.1 11.4 29.2 Wrist Base 1st Digit     Ulnar Anti Sensory (5th Digit)  31.6C  Wrist 3.3 3.4 0.1 17.3 17.5 1.1 Wrist 5th Digit 42 41 1   Motor Left/Right Comparison   Stim Site L Lat (ms) R Lat (ms) L-R Lat (ms) L Amp (mV) R Amp (mV) L-R Amp (%) Site1 Site2 L Vel (m/s) R Vel (m/s) L-R Vel (m/s)  Median Motor (Abd Poll Brev)  31.4C  Wrist *4.5 *4.6 0.1 7.6 5.7 25.0 Elbow Wrist *47 *47 0  Elbow 9.8 9.8 0.0 3.2 2.0 37.5       Ulnar Motor (Abd Dig Min)  31.4C  Wrist 3.0 3.0 0.0 7.8 9.4 17.0 B Elbow Wrist 55 55 0  B Elbow 7.0 7.0 0.0 8.1 4.5 44.4 A Elbow B Elbow 53 63 10  A Elbow 5.1 8.6 3.5 7.9 9.6 17.7          Waveforms:                      Clinical History: No specialty comments available.   She reports that she has been smoking cigarettes. She has a 3.50 pack-year smoking history. She has never used smokeless tobacco.  Recent Labs    09/16/19 0900 09/16/19 0928 02/02/20 0935 06/21/20 1126 08/04/20 0919  HGBA1C 6.8*  --  6.5  --   --   LABURIC  --  8.5*  --  6.6 6.4    Objective:  VS:  HT:    WT:   BMI:     BP:   HR: bpm  TEMP: ( )  RESP:  Physical Exam Musculoskeletal:        General: No swelling, tenderness or deformity.     Comments: Inspection reveals no atrophy of the bilateral APB or FDI or hand intrinsics. There is no swelling, color changes, allodynia or dystrophic changes. There is 5 out of 5 strength in the bilateral wrist extension, finger abduction and long finger flexion. There is intact sensation to light touch in all dermatomal and peripheral nerve distributions. There is a negative Phalen's test bilaterally. There is a negative Hoffmann's test bilaterally.  Skin:    General: Skin is warm and dry.     Findings: No erythema or rash.  Neurological:     General: No focal deficit present.     Mental Status: She is alert and oriented to person, place, and time.       Motor: No weakness or abnormal muscle tone.     Coordination: Coordination normal.  Psychiatric:        Mood and Affect: Mood normal.        Behavior: Behavior normal.     Ortho Exam  Imaging: No results found.  Past Medical/Family/Surgical/Social History: Medications & Allergies reviewed per EMR, new medications updated. Patient Active Problem List   Diagnosis Date Noted  . Bilateral hand pain 08/04/2020  . Allergic conjunctivitis of both eyes 07/19/2020  . Lumbar radiculopathy, chronic 04/03/2020  . Elevated blood uric acid level 04/03/2020  . Family history of cervical cancer 02/02/2018  . Family history of breast cancer 02/02/2018  . Vitamin D deficiency 02/02/2018  . Depression 06/19/2017  . Hypomagnesemia 06/19/2017  . 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 Snoring 09/11/2016  . History of migraine 09/11/2016  . Frequent headaches 09/11/2016  . Encounter for health maintenance examination in adult 12/04/2015  . Moderate persistent asthma 06/01/2015  .  Gastroesophageal reflux disease without esophagitis 06/01/2015  . Other allergic rhinitis 06/01/2015  . Genital herpes 11/30/2014  . Tobacco user 11/30/2014  . Essential hypertension 11/30/2014  . Hyperlipidemia 11/30/2014  . Impaired fasting blood sugar 11/30/2014  . Not well controlled asthma without complication 67/89/3810  . Obesity 07/02/2012  . Diabetes mellitus without complication ([place]) 17/51/0258   Past Medical History:  Diagnosis Date  . Asthma    2015 hospitaliation for asthma  . Diabetes mellitus    vs impaired fasting glucose  . Family history of breast cancer   . Fibroids 2010  . Genital herpes   . GERD (gastroesophageal reflux disease)   . Hyperlipidemia   . Hypertension   . Impaired fasting blood sugar   . Migraines   . Obesity   . Smoker    Family History  Problem Relation Age of Onset  . Diabetes Mother   . Hypertension Mother   . Aneurysm Mother   . Stroke  Mother   . Cancer Mother        cervical cancer  . Cancer Maternal Aunt        breast  . Cancer Cousin        breast/breast  . Lupus Cousin   . Heart disease Neg Hx   . Colon cancer Neg Hx   . Allergic rhinitis Neg Hx   . Angioedema Neg Hx   . Asthma Neg Hx   . Atopy Neg Hx   . Eczema Neg Hx   . Immunodeficiency Neg Hx   . Urticaria Neg Hx    Past Surgical History:  Procedure Laterality Date  . COLONOSCOPY  03/13/2016   Dr. Carlean Purl, normal, repeat 2027  . CYST EXCISION     left neck/postauricular region, benign  . LAPAROSCOPIC ABDOMINAL EXPLORATION     removal of ectopic preg  . LEFT HEART CATH AND CORONARY ANGIOGRAPHY N/A 06/23/2017   Procedure: LEFT HEART CATH AND CORONARY ANGIOGRAPHY;  Surgeon: Nelva Bush, MD;  Location: [facility];  Service: Cardiovascular;  Laterality: N/A;  . TUBAL LIGATION     Social History   Occupational History  . Not on file  Tobacco Use  . Smoking status: Current Every Day Smoker    Packs/day: 0.25    Years: 14.00    Pack years: 3.50    Types: Cigarettes  . Smokeless tobacco: Never Used  Vaping Use  . Vaping Use: Never used  Substance and Sexual Activity  . Alcohol use: Yes    Comment: occ  . Drug use: No  . Sexual activity: Yes    Birth control/protection: Surgical

## 2020-08-15 NOTE — Procedures (Signed)
EMG & NCV Findings: Evaluation of the left median motor and the right median motor nerves showed prolonged distal onset latency (L4.5, R4.6 ms) and decreased conduction velocity (Elbow-Wrist, L47, R47 m/s).  The left median (across palm) sensory nerve showed prolonged distal peak latency (Wrist, 4.7 ms) and prolonged distal peak latency (Palm, 12.8 ms).  The right median (across palm) sensory nerve showed no response (Palm), prolonged distal peak latency (5.0 ms), and reduced amplitude (5.8 V).  All remaining nerves (as indicated in the following tables) were within normal limits.  All left vs. right side differences were within normal limits.    All examined muscles (as indicated in the following table) showed no evidence of electrical instability.    Impression: The above electrodiagnostic study is ABNORMAL and reveals evidence of a moderate BILATERAL median nerve entrapment at the wrists (carpal tunnel syndrome) affecting sensory and motor components.   There is no significant electrodiagnostic evidence of any other focal nerve entrapment, brachial plexopathy or cervical radiculopathy.   Recommendations: 1.  Follow-up with referring physician. 2.  Continue current management of symptoms. 3.  Continue use of resting splint at night-time and as needed during the day. 4.  Suggest surgical evaluation.  ___________________________ Laurence Spates FAAPMR Board Certified, American Board of Physical Medicine and Rehabilitation    Nerve Conduction Studies Anti Sensory Summary Table   Stim Site NR Peak (ms) Norm Peak (ms) P-T Amp (V) Norm P-T Amp Site1 Site2 Delta-P (ms) Dist (cm) Vel (m/s) Norm Vel (m/s)  Left Median Acr Palm Anti Sensory (2nd Digit)  31.3C  Wrist    *4.7 <3.6 13.3 >10 Wrist Palm 8.1 0.0    Palm    *12.8 <2.0 5.4         Right Median Acr Palm Anti Sensory (2nd Digit)  30.8C  Wrist    *5.0 <3.6 *5.8 >10 Wrist Palm  0.0    Palm *NR  <2.0          Left Radial Anti Sensory  (Base 1st Digit)  31C  Wrist    2.2 <3.1 16.1  Wrist Base 1st Digit 2.2 0.0    Right Radial Anti Sensory (Base 1st Digit)  30.9C  Wrist    2.2 <3.1 11.4  Wrist Base 1st Digit 2.2 0.0    Left Ulnar Anti Sensory (5th Digit)  31.6C  Wrist    3.3 <3.7 17.3 >15.0 Wrist 5th Digit 3.3 14.0 42 >38  Right Ulnar Anti Sensory (5th Digit)  31.1C  Wrist    3.4 <3.7 17.5 >15.0 Wrist 5th Digit 3.4 14.0 41 >38   Motor Summary Table   Stim Site NR Onset (ms) Norm Onset (ms) O-P Amp (mV) Norm O-P Amp Site1 Site2 Delta-0 (ms) Dist (cm) Vel (m/s) Norm Vel (m/s)  Left Median Motor (Abd Poll Brev)  31.4C  Wrist    *4.5 <4.2 7.6 >5 Elbow Wrist 5.3 25.0 *47 >50  Elbow    9.8  3.2         Right Median Motor (Abd Poll Brev)  31.2C  Wrist    *4.6 <4.2 5.7 >5 Elbow Wrist 5.2 24.5 *47 >50  Elbow    9.8  2.0         Left Ulnar Motor (Abd Dig Min)  31.4C  Wrist    3.0 <4.2 7.8 >3 B Elbow Wrist 4.0 22.0 55 >53  B Elbow    7.0  8.1  A Elbow B Elbow 1.9 10.0 53 >53  A Elbow  5.1  7.9         Right Ulnar Motor (Abd Dig Min)  31.4C  Wrist    3.0 <4.2 9.4 >3 B Elbow Wrist 4.0 22.0 55 >53  B Elbow    7.0  4.5  A Elbow B Elbow 1.6 10.0 63 >53  A Elbow    8.6  9.6          EMG   Side Muscle Nerve Root Ins Act Fibs Psw Amp Dur Poly Recrt Int Fraser Din Comment  Right Abd Poll Brev Median C8-T1 Nml Nml Nml Nml Nml 0 Nml Nml   Right 1stDorInt Ulnar C8-T1 Nml Nml Nml Nml Nml 0 Nml Nml   Right PronatorTeres Median C6-7 Nml Nml Nml Nml Nml 0 Nml Nml   Right Biceps Musculocut C5-6 Nml Nml Nml Nml Nml 0 Nml Nml   Right Deltoid Axillary C5-6 Nml Nml Nml Nml Nml 0 Nml Nml     Nerve Conduction Studies Anti Sensory Left/Right Comparison   Stim Site L Lat (ms) R Lat (ms) L-R Lat (ms) L Amp (V) R Amp (V) L-R Amp (%) Site1 Site2 L Vel (m/s) R Vel (m/s) L-R Vel (m/s)  Median Acr Palm Anti Sensory (2nd Digit)  31.3C  Wrist *4.7 *5.0 0.3 13.3 *5.8 56.4 Wrist Palm     Palm *12.8   5.4         Radial Anti Sensory (Base 1st  Digit)  31C  Wrist 2.2 2.2 0.0 16.1 11.4 29.2 Wrist Base 1st Digit     Ulnar Anti Sensory (5th Digit)  31.6C  Wrist 3.3 3.4 0.1 17.3 17.5 1.1 Wrist 5th Digit 42 41 1   Motor Left/Right Comparison   Stim Site L Lat (ms) R Lat (ms) L-R Lat (ms) L Amp (mV) R Amp (mV) L-R Amp (%) Site1 Site2 L Vel (m/s) R Vel (m/s) L-R Vel (m/s)  Median Motor (Abd Poll Brev)  31.4C  Wrist *4.5 *4.6 0.1 7.6 5.7 25.0 Elbow Wrist *47 *47 0  Elbow 9.8 9.8 0.0 3.2 2.0 37.5       Ulnar Motor (Abd Dig Min)  31.4C  Wrist 3.0 3.0 0.0 7.8 9.4 17.0 B Elbow Wrist 55 55 0  B Elbow 7.0 7.0 0.0 8.1 4.5 44.4 A Elbow B Elbow 53 63 10  A Elbow 5.1 8.6 3.5 7.9 9.6 17.7          Waveforms:

## 2020-08-18 ENCOUNTER — Encounter: Payer: Self-pay | Admitting: Orthopaedic Surgery

## 2020-08-18 ENCOUNTER — Ambulatory Visit (INDEPENDENT_AMBULATORY_CARE_PROVIDER_SITE_OTHER): Payer: Self-pay | Admitting: Orthopaedic Surgery

## 2020-08-18 ENCOUNTER — Other Ambulatory Visit: Payer: Self-pay

## 2020-08-18 ENCOUNTER — Ambulatory Visit: Payer: Self-pay | Admitting: Orthopaedic Surgery

## 2020-08-18 VITALS — Ht 69.0 in | Wt 279.0 lb

## 2020-08-18 DIAGNOSIS — G5602 Carpal tunnel syndrome, left upper limb: Secondary | ICD-10-CM

## 2020-08-18 DIAGNOSIS — G5601 Carpal tunnel syndrome, right upper limb: Secondary | ICD-10-CM

## 2020-08-18 NOTE — Progress Notes (Signed)
Office Visit Note   Patient: Sydney Williams           Date of Birth: 01-Jan-1966           MRN: [id number] Visit Date: 08/18/2020              Requested by: Elsie Stain, MD [address],  [place] [address] PCP: Elsie Stain, MD   Assessment & Plan: Visit Diagnoses:  1. Right carpal tunnel syndrome   2. Left carpal tunnel syndrome     Plan: Impression is bilateral carpal tunnel syndrome moderate in severity.  These were reviewed with the patient detail and treatment options discussed at length.  Based on discussion she would like to have right carpal tunnel release followed by the left 1 sequentially.  Questions encouraged and answered.  Risk benefits rehab recovery reviewed in detail.  Follow-Up Instructions: No follow-ups on file.   Orders:  No orders of the defined types were placed in this encounter.  No orders of the defined types were placed in this encounter.     Procedures: No procedures performed   Clinical Data: No additional findings.   Subjective: Chief Complaint  Patient presents with  . Right Hand - Pain, Follow-up    EMG/NCS review  . Left Hand - Follow-up, Pain    EMG/NCS review    Sydney Williams returns today for nerve conduction study review.  Denies any changes in medical history.  She has seen Dr. Benjamine Mola in the meantime for work-up of autoimmune disorders.  Nerve conduction studies found bilateral moderate carpal tunnel syndrome.  Braces have not helped to a significant degree.   Review of Systems  Constitutional: Negative.   HENT: Negative.   Eyes: Negative.   Respiratory: Negative.   Cardiovascular: Negative.   Endocrine: Negative.   Musculoskeletal: Negative.   Neurological: Negative.   Hematological: Negative.   Psychiatric/Behavioral: Negative.   All other systems reviewed and are negative.    Objective: Vital Signs: Ht 5\' 9"  (1.753 m)   Wt 279 lb (126.6 kg)   LMP 06/29/2015 (Exact Date)   BMI 41.20 kg/m    Physical Exam Vitals and nursing note reviewed.  Constitutional:      Appearance: She is well-developed.  Pulmonary:     Effort: Pulmonary effort is normal.  Skin:    General: Skin is warm.     Capillary Refill: Capillary refill takes less than 2 seconds.  Neurological:     Mental Status: She is alert and oriented to person, place, and time.  Psychiatric:        Behavior: Behavior normal.        Thought Content: Thought content normal.        Judgment: Judgment normal.     Ortho Exam Bilateral hand exams are unchanged. Specialty Comments:  No specialty comments available.  Imaging: No results found.   PMFS History: Patient Active Problem List   Diagnosis Date Noted  . Bilateral hand pain 08/04/2020  . Allergic conjunctivitis of both eyes 07/19/2020  . Lumbar radiculopathy, chronic 04/03/2020  . Elevated blood uric acid level 04/03/2020  . Family history of cervical cancer 02/02/2018  . Family history of breast cancer 02/02/2018  . Vitamin D deficiency 02/02/2018  . Depression 06/19/2017  . Hypomagnesemia 06/19/2017  . 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 Snoring 09/11/2016  . History of migraine 09/11/2016  . Frequent headaches 09/11/2016  . Encounter for health maintenance examination in adult 12/04/2015  .  Moderate persistent asthma 06/01/2015  . Gastroesophageal reflux disease without esophagitis 06/01/2015  . Other allergic rhinitis 06/01/2015  . Genital herpes 11/30/2014  . Tobacco user 11/30/2014  . Essential hypertension 11/30/2014  . Hyperlipidemia 11/30/2014  . Impaired fasting blood sugar 11/30/2014  . Not well controlled asthma without complication 44/31/5400  . Obesity 07/02/2012  . Diabetes mellitus without complication ([place]) 86/76/1950   Past Medical History:  Diagnosis Date  . Asthma    2015 hospitaliation for asthma  . Diabetes mellitus    vs impaired fasting glucose  . Family history of breast cancer   . Fibroids  2010  . Genital herpes   . GERD (gastroesophageal reflux disease)   . Hyperlipidemia   . Hypertension   . Impaired fasting blood sugar   . Migraines   . Obesity   . Smoker     Family History  Problem Relation Age of Onset  . Diabetes Mother   . Hypertension Mother   . Aneurysm Mother   . Stroke Mother   . Cancer Mother        cervical cancer  . Cancer Maternal Aunt        breast  . Cancer Cousin        breast/breast  . Lupus Cousin   . Heart disease Neg Hx   . Colon cancer Neg Hx   . Allergic rhinitis Neg Hx   . Angioedema Neg Hx   . Asthma Neg Hx   . Atopy Neg Hx   . Eczema Neg Hx   . Immunodeficiency Neg Hx   . Urticaria Neg Hx     Past Surgical History:  Procedure Laterality Date  . COLONOSCOPY  03/13/2016   Dr. Carlean Purl, normal, repeat 2027  . CYST EXCISION     left neck/postauricular region, benign  . LAPAROSCOPIC ABDOMINAL EXPLORATION     removal of ectopic preg  . LEFT HEART CATH AND CORONARY ANGIOGRAPHY N/A 06/23/2017   Procedure: LEFT HEART CATH AND CORONARY ANGIOGRAPHY;  Surgeon: Nelva Bush, MD;  Location: [facility];  Service: Cardiovascular;  Laterality: N/A;  . TUBAL LIGATION     Social History   Occupational History  . Not on file  Tobacco Use  . Smoking status: Current Every Day Smoker    Packs/day: 0.25    Years: 14.00    Pack years: 3.50    Types: Cigarettes  . Smokeless tobacco: Never Used  Vaping Use  . Vaping Use: Never used  Substance and Sexual Activity  . Alcohol use: Yes    Comment: occ  . Drug use: No  . Sexual activity: Yes    Birth control/protection: Surgical

## 2020-08-21 NOTE — Progress Notes (Signed)
Sydney Williams tests show increased inflammation but no problems with liver or kidney or viral infections. Her uric acid is still a bit high but I do not think gout is the main cause at this time. We will follow up as planned and may start new medication at that time if needed.

## 2020-08-23 MED FILL — ?METOPROLOL TART 50 MG TABL: 50 | 30 days supply | Qty: 60 | Fill #1

## 2020-08-24 ENCOUNTER — Encounter (HOSPITAL_BASED_OUTPATIENT_CLINIC_OR_DEPARTMENT_OTHER): Payer: Self-pay | Admitting: Orthopaedic Surgery

## 2020-08-24 ENCOUNTER — Other Ambulatory Visit: Payer: Self-pay

## 2020-08-26 ENCOUNTER — Inpatient Hospital Stay (HOSPITAL_COMMUNITY): Admission: RE | Admit: 2020-08-26 | Payer: Medicaid Other | Source: Ambulatory Visit

## 2020-08-28 ENCOUNTER — Encounter (HOSPITAL_BASED_OUTPATIENT_CLINIC_OR_DEPARTMENT_OTHER)
Admission: RE | Admit: 2020-08-28 | Discharge: 2020-08-28 | Disposition: A | Discharge status: Home / Self Care | Payer: Medicaid Other | Source: Ambulatory Visit | Attending: Orthopaedic Surgery | Admitting: Orthopaedic Surgery

## 2020-08-28 ENCOUNTER — Other Ambulatory Visit (HOSPITAL_COMMUNITY)
Admission: RE | Admit: 2020-08-28 | Discharge: 2020-08-28 | Disposition: A | Discharge status: Home / Self Care | Payer: Medicaid Other | Source: Ambulatory Visit | Attending: Orthopaedic Surgery | Admitting: Orthopaedic Surgery

## 2020-08-28 DIAGNOSIS — Z01812 Encounter for preprocedural laboratory examination: Secondary | ICD-10-CM | POA: Insufficient documentation

## 2020-08-28 DIAGNOSIS — Z20822 Contact with and (suspected) exposure to covid-19: Secondary | ICD-10-CM | POA: Diagnosis not present

## 2020-08-28 DIAGNOSIS — Z01818 Encounter for other preprocedural examination: Secondary | ICD-10-CM | POA: Diagnosis not present

## 2020-08-28 LAB — BASIC METABOLIC PANEL
Anion gap: 14 (ref 5–15)
BUN: 6 mg/dL (ref 6–20)
CO2: 28 mmol/L (ref 22–32)
Calcium: 9.4 mg/dL (ref 8.9–10.3)
Chloride: 99 mmol/L (ref 98–111)
Creatinine, Ser: 0.55 mg/dL (ref 0.44–1.00)
GFR, Estimated: >60 mL/min (ref >60)
Glucose, Bld: 133 mg/dL — ABNORMAL HIGH (ref 70–99)
Potassium: 3.6 mmol/L (ref 3.5–5.1)
Sodium: 141 mmol/L (ref 135–145)

## 2020-08-28 LAB — SARS CORONAVIRUS 2 (TAT 6-24 HRS): SARS Coronavirus 2: NEGATIVE

## 2020-08-28 NOTE — Progress Notes (Signed)

## 2020-08-28 NOTE — Progress Notes (Signed)
Office Visit Note  Patient: Sydney Williams             Date of Birth: 12/08/1965           MRN: [id number]             PCP: Elsie Stain, MD Referring: Elsie Stain, MD Visit Date: 08/29/2020   Subjective:  Follow-up (no improvements)  History of Present Illness: Sydney Williams is a 54 y.o. female here for follow up of what appears to be new diagnosis of seronegative RA involving bilateral hands. This is diagnosed based on persistent joint tenderness and swelling of multiple sites in her hands along with highly elevated ESR being checked while also evaluating for her carpal tunnel syndrome. After last visit she had tests that remained positive for inflammation but negative for RA related antibodies and uric acid was normal. We discussed use of NSAIDs and consideration to start methotrexate if symptoms were not improving to conservative management.  Since that time she continues to have significant joint pain and stiffness lasting up to 90 minutes in the morning  Activities of Daily Living:  Patient reports morning stiffness for 1-1.5 hours.   Patient Reports nocturnal pain.  Difficulty dressing/grooming: Reports Difficulty climbing stairs: Reports Difficulty getting out of chair: Reports Difficulty using hands for taps, buttons, cutlery, and/or writing: Reports  Review of Systems  Constitutional: Negative for fatigue.  HENT: Positive for mouth dryness. Negative for mouth sores and nose dryness.   Eyes: Negative for pain, itching, visual disturbance and dryness.  Respiratory: Negative for cough, hemoptysis, shortness of breath and difficulty breathing.   Cardiovascular: Positive for swelling in legs/feet. Negative for chest pain and palpitations.  Gastrointestinal: Negative for abdominal pain, blood in stool, constipation and diarrhea.  Endocrine: Negative for increased urination.  Genitourinary: Negative for painful urination.  Musculoskeletal: Positive for arthralgias, joint  pain, joint swelling, myalgias, muscle weakness, morning stiffness and myalgias. Negative for muscle tenderness.  Skin: Negative for color change, rash and redness.  Allergic/Immunologic: Negative for susceptible to infections.  Neurological: Negative for dizziness, numbness, headaches, memory loss and weakness.  Hematological: Positive for swollen glands.  Psychiatric/Behavioral: Positive for sleep disturbance. Negative for confusion.    PMFS History:  Patient Active Problem List   Diagnosis Date Noted  . Carpal tunnel syndrome on right   . Seronegative rheumatoid arthritis ([place]) 08/04/2020  . Allergic conjunctivitis of both eyes 07/19/2020  . Lumbar radiculopathy, chronic 04/03/2020  . Elevated blood uric acid level 04/03/2020  . Family history of cervical cancer 02/02/2018  . Family history of breast cancer 02/02/2018  . Vitamin D deficiency 02/02/2018  . Depression 06/19/2017  . Hypomagnesemia 06/19/2017  . 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 Snoring 09/11/2016  . History of migraine 09/11/2016  . Frequent headaches 09/11/2016  . Encounter for health maintenance examination in adult 12/04/2015  . Moderate persistent asthma 06/01/2015  . Gastroesophageal reflux disease without esophagitis 06/01/2015  . Other allergic rhinitis 06/01/2015  . Genital herpes 11/30/2014  . Tobacco user 11/30/2014  . Essential hypertension 11/30/2014  . Hyperlipidemia 11/30/2014  . Impaired fasting blood sugar 11/30/2014  . Not well controlled asthma without complication 59/29/2446  . Obesity 07/02/2012  . Diabetes mellitus without complication ([place]) 28/63/8177    Past Medical History:  Diagnosis Date  . Asthma    2015 hospitaliation for asthma  . Diabetes mellitus    vs impaired fasting glucose  . Family history of breast cancer   .  Fibroids 2010  . Genital herpes   . GERD (gastroesophageal reflux disease)   . Hyperlipidemia   . Hypertension   . Impaired fasting  blood sugar   . Migraines   . Obesity   . Right carpal tunnel syndrome   . Smoker     Family History  Problem Relation Age of Onset  . Diabetes Mother   . Hypertension Mother   . Aneurysm Mother   . Stroke Mother   . Cancer Mother        cervical cancer  . Cancer Maternal Aunt        breast  . Cancer Cousin        breast/breast  . Lupus Cousin   . Heart disease Neg Hx   . Colon cancer Neg Hx   . Allergic rhinitis Neg Hx   . Angioedema Neg Hx   . Asthma Neg Hx   . Atopy Neg Hx   . Eczema Neg Hx   . Immunodeficiency Neg Hx   . Urticaria Neg Hx    Past Surgical History:  Procedure Laterality Date  . COLONOSCOPY  03/13/2016   Dr. Carlean Purl, normal, repeat 2027  . CYST EXCISION     left neck/postauricular region, benign  . LAPAROSCOPIC ABDOMINAL EXPLORATION     removal of ectopic preg  . LEFT HEART CATH AND CORONARY ANGIOGRAPHY N/A 06/23/2017   Procedure: LEFT HEART CATH AND CORONARY ANGIOGRAPHY;  Surgeon: Nelva Bush, MD;  Location: [facility];  Service: Cardiovascular;  Laterality: N/A;  . TUBAL LIGATION     Social History   Social History Narrative   Married, and her granddaughter lives with her, works as Quarry manager at [employer], exercise - activity at work, walking.  12/2016   Immunization History  Administered Date(s) Administered  . Influenza,inj,Quad PF,6+ Mos 12/04/2015, 01/08/2017  . Influenza-Unspecified 08/28/2018  . PFIZER SARS-COV-2 Vaccination 01/27/2020, 02/21/2020  . PPD Test 07/27/2019  . Tdap 11/30/2014     Objective: Vital Signs: BP 127/80 (BP Location: Right Arm, Patient Position: Sitting, Cuff Size: Small)   Pulse 82   Ht '5\' 9"'  (1.753 m)   Wt 286 lb (129.7 kg)   LMP 06/29/2015 (Exact Date)   BMI 42.23 kg/m    Physical Exam HENT:     Right Ear: External ear normal.     Left Ear: External ear normal.  Skin:    General: Skin is warm and dry.     Findings: No rash.  Neurological:     General: No focal deficit present.      Mental Status: She is alert.      Musculoskeletal Exam:  Neck full range of motion no tenderness Shoulder, elbow, wrist, full range of motion no tenderness or swelling Swelling and tenderness of right fourth MCP and PIP there is also tenderness of the third and fifth MCP without swelling, left fourth MCP tenderness without swelling, both hands reduced range of motion in flexion Knees, ankles full range of motion no tenderness or swelling   CDAI Exam: CDAI Score: 7.7  Patient Global: 4 mm; Provider Global: 3 mm Swollen: 2 ; Tender: 5  Joint Exam 08/29/2020      Right  Left  MCP 3   Tender     MCP 4  Swollen Tender   Tender  MCP 5   Tender     PIP 4  Swollen Tender        Investigation: No additional findings.  Imaging: NCV with EMG (electromyography)  Result Date: 08/11/2020 Magnus Sinning, MD     08/15/2020  4:44 PM EMG & NCV Findings: Evaluation of the left median motor and the right median motor nerves showed prolonged distal onset latency (L4.5, R4.6 ms) and decreased conduction velocity (Elbow-Wrist, L47, R47 m/s).  The left median (across palm) sensory nerve showed prolonged distal peak latency (Wrist, 4.7 ms) and prolonged distal peak latency (Palm, 12.8 ms).  The right median (across palm) sensory nerve showed no response (Palm), prolonged distal peak latency (5.0 ms), and reduced amplitude (5.8 V).  All remaining nerves (as indicated in the following tables) were within normal limits.  All left vs. right side differences were within normal limits.  All examined muscles (as indicated in the following table) showed no evidence of electrical instability.  Impression: The above electrodiagnostic study is ABNORMAL and reveals evidence of a moderate BILATERAL median nerve entrapment at the wrists (carpal tunnel syndrome) affecting sensory and motor components. There is no significant electrodiagnostic evidence of any other focal nerve entrapment, brachial plexopathy or cervical  radiculopathy. Recommendations: 1.  Follow-up with referring physician. 2.  Continue current management of symptoms. 3.  Continue use of resting splint at night-time and as needed during the day. 4.  Suggest surgical evaluation. ___________________________ Laurence Spates FAAPMR Board Certified, American Board of Physical Medicine and Rehabilitation Nerve Conduction Studies Anti Sensory Summary Table  Stim Site NR Peak (ms) Norm Peak (ms) P-T Amp (V) Norm P-T Amp Site1 Site2 Delta-P (ms) Dist (cm) Vel (m/s) Norm Vel (m/s) Left Median Acr Palm Anti Sensory (2nd Digit)  31.3C Wrist    *4.7 <3.6 13.3 >10 Wrist Palm 8.1 0.0   Palm    *12.8 <2.0 5.4        Right Median Acr Palm Anti Sensory (2nd Digit)  30.8C Wrist    *5.0 <3.6 *5.8 >10 Wrist Palm  0.0   Palm *NR  <2.0         Left Radial Anti Sensory (Base 1st Digit)  31C Wrist    2.2 <3.1 16.1  Wrist Base 1st Digit 2.2 0.0   Right Radial Anti Sensory (Base 1st Digit)  30.9C Wrist    2.2 <3.1 11.4  Wrist Base 1st Digit 2.2 0.0   Left Ulnar Anti Sensory (5th Digit)  31.6C Wrist    3.3 <3.7 17.3 >15.0 Wrist 5th Digit 3.3 14.0 42 >38 Right Ulnar Anti Sensory (5th Digit)  31.1C Wrist    3.4 <3.7 17.5 >15.0 Wrist 5th Digit 3.4 14.0 41 >38 Motor Summary Table  Stim Site NR Onset (ms) Norm Onset (ms) O-P Amp (mV) Norm O-P Amp Site1 Site2 Delta-0 (ms) Dist (cm) Vel (m/s) Norm Vel (m/s) Left Median Motor (Abd Poll Brev)  31.4C Wrist    *4.5 <4.2 7.6 >5 Elbow Wrist 5.3 25.0 *47 >50 Elbow    9.8  3.2        Right Median Motor (Abd Poll Brev)  31.2C Wrist    *4.6 <4.2 5.7 >5 Elbow Wrist 5.2 24.5 *47 >50 Elbow    9.8  2.0        Left Ulnar Motor (Abd Dig Min)  31.4C Wrist    3.0 <4.2 7.8 >3 B Elbow Wrist 4.0 22.0 55 >53 B Elbow    7.0  8.1  A Elbow B Elbow 1.9 10.0 53 >53 A Elbow    5.1  7.9        Right Ulnar Motor (Abd Dig Min)  31.4C Wrist    3.0 <  4.2 9.4 >3 B Elbow Wrist 4.0 22.0 55 >53 B Elbow    7.0  4.5  A Elbow B Elbow 1.6 10.0 63 >53 A Elbow    8.6  9.6        EMG   Side Muscle Nerve Root Ins Act Fibs Psw Amp Dur Poly Recrt Int Fraser Din Comment Right Abd Poll Brev Median C8-T1 Nml Nml Nml Nml Nml 0 Nml Nml  Right 1stDorInt Ulnar C8-T1 Nml Nml Nml Nml Nml 0 Nml Nml  Right PronatorTeres Median C6-7 Nml Nml Nml Nml Nml 0 Nml Nml  Right Biceps Musculocut C5-6 Nml Nml Nml Nml Nml 0 Nml Nml  Right Deltoid Axillary C5-6 Nml Nml Nml Nml Nml 0 Nml Nml  Nerve Conduction Studies Anti Sensory Left/Right Comparison  Stim Site L Lat (ms) R Lat (ms) L-R Lat (ms) L Amp (V) R Amp (V) L-R Amp (%) Site1 Site2 L Vel (m/s) R Vel (m/s) L-R Vel (m/s) Median Acr Palm Anti Sensory (2nd Digit)  31.3C Wrist *4.7 *5.0 0.3 13.3 *5.8 56.4 Wrist Palm    Palm *12.8   5.4        Radial Anti Sensory (Base 1st Digit)  31C Wrist 2.2 2.2 0.0 16.1 11.4 29.2 Wrist Base 1st Digit    Ulnar Anti Sensory (5th Digit)  31.6C Wrist 3.3 3.4 0.1 17.3 17.5 1.1 Wrist 5th Digit 42 41 1 Motor Left/Right Comparison  Stim Site L Lat (ms) R Lat (ms) L-R Lat (ms) L Amp (mV) R Amp (mV) L-R Amp (%) Site1 Site2 L Vel (m/s) R Vel (m/s) L-R Vel (m/s) Median Motor (Abd Poll Brev)  31.4C Wrist *4.5 *4.6 0.1 7.6 5.7 25.0 Elbow Wrist *47 *47 0 Elbow 9.8 9.8 0.0 3.2 2.0 37.5      Ulnar Motor (Abd Dig Min)  31.4C Wrist 3.0 3.0 0.0 7.8 9.4 17.0 B Elbow Wrist 55 55 0 B Elbow 7.0 7.0 0.0 8.1 4.5 44.4 A Elbow B Elbow 53 63 10 A Elbow 5.1 8.6 3.5 7.9 9.6 17.7      Waveforms:              Recent Labs: Lab Results  Component Value Date   WBC 7.9 08/04/2020   HGB 13.7 08/04/2020   PLT 302 08/04/2020   NA 141 08/28/2020   K 3.6 08/28/2020   CL 99 08/28/2020   CO2 28 08/28/2020   GLUCOSE 133 (H) 08/28/2020   BUN 6 08/28/2020   CREATININE 0.55 08/28/2020   BILITOT 0.3 08/04/2020   ALKPHOS 125 (H) 06/06/2020   AST 17 08/04/2020   ALT 19 08/04/2020   PROT 7.5 08/04/2020   ALBUMIN 4.1 06/06/2020   CALCIUM 9.4 08/28/2020   GFRAA 125 08/04/2020   QFTBGOLDPLUS NEGATIVE 08/04/2020    Speciality Comments: No specialty comments  available.  Procedures:  No procedures performed Allergies: Lisinopril, Potassium chloride, Pneumococcal vaccines, and Vicodin [hydrocodone-acetaminophen]   Assessment / Plan:     Visit Diagnoses: Seronegative rheumatoid arthritis ([place])  She continues to have tenderness and stiffness of several joints as well as swelling of at least one finger on the right hand with prolonged morning stiffness all consistent with inflammatory arthritis.  She certainly has carpal tunnel syndrome and will likely benefit from upcoming surgery for this but does not explain the pattern of joint involvement as well as highly elevated inflammatory markers.  Recommend starting DMARD treatment with methotrexate at 15 mg by mouth weekly and with folic acid 1 mg daily provided  information to review about these medications to the patient to call back after has her carpal tunnel surgery to discuss treatment.  Carpal tunnel syndrome on right  Upcoming surgery in 1 day for carpal tunnel release this should benefit her hand pain I do not know if it will improve the swelling and range of motion.  Orders: No orders of the defined types were placed in this encounter.  No orders of the defined types were placed in this encounter.   Follow-Up Instructions: Return in about 4 weeks (around 09/26/2020) for Methotrexate start f/u.   Collier Salina, MD  Note - This record has been created using [employer].  Chart creation errors have been sought, but may not always  have been located. Such creation errors do not reflect on  the standard of medical care.

## 2020-08-29 ENCOUNTER — Other Ambulatory Visit: Payer: Self-pay

## 2020-08-29 ENCOUNTER — Ambulatory Visit (INDEPENDENT_AMBULATORY_CARE_PROVIDER_SITE_OTHER): Payer: Self-pay | Admitting: Internal Medicine

## 2020-08-29 ENCOUNTER — Encounter: Payer: Self-pay | Admitting: Internal Medicine

## 2020-08-29 VITALS — BP 127/80 | HR 82 | Ht 69.0 in | Wt 286.0 lb

## 2020-08-29 DIAGNOSIS — M0600 Rheumatoid arthritis without rheumatoid factor, unspecified site: Secondary | ICD-10-CM

## 2020-08-29 DIAGNOSIS — G5601 Carpal tunnel syndrome, right upper limb: Secondary | ICD-10-CM

## 2020-08-29 MED FILL — IBUPROFEN 600 MG TABLET: 600 | 30 days supply | Qty: 90 | Fill #1

## 2020-08-29 NOTE — Patient Instructions (Signed)
Methotrexate tablets What is this medicine? METHOTREXATE (METH oh TREX ate) is a chemotherapy drug used to treat cancer including breast cancer, leukemia, and lymphoma. This medicine can also be used to treat psoriasis and certain kinds of arthritis. This medicine may be used for other purposes; ask your health care provider or pharmacist if you have questions. COMMON BRAND NAME(S): Rheumatrex, Trexall What should I tell my health care provider before I take this medicine? They need to know if you have any of these conditions:  fluid in the stomach area or lungs  if you often drink alcohol  infection or immune system problems  kidney disease or on hemodialysis  liver disease  low blood counts, like low white cell, platelet, or red cell counts  lung disease  radiation therapy  stomach ulcers  ulcerative colitis  an unusual or allergic reaction to methotrexate, other medicines, foods, dyes, or preservatives  pregnant or trying to get pregnant  breast-feeding How should I use this medicine? Take this medicine by mouth with a glass of water. Follow the directions on the prescription label. Take your medicine at regular intervals. Do not take it more often than directed. Do not stop taking except on your doctor's advice. Make sure you know why you are taking this medicine and how often you should take it. If this medicine is used for a condition that is not cancer, like arthritis or psoriasis, it should be taken weekly, NOT daily. Taking this medicine more often than directed can cause serious side effects, even death. Talk to your healthcare provider about safe handling and disposal of this medicine. You may need to take special precautions. Talk to your pediatrician regarding the use of this medicine in children. While this drug may be prescribed for selected conditions, precautions do apply. Overdosage: If you think you have taken too much of this medicine contact a poison control  center or emergency room at once. NOTE: This medicine is only for you. Do not share this medicine with others. What if I miss a dose? If you miss a dose, talk with your doctor or health care professional. Do not take double or extra doses. What may interact with this medicine? This medicine may interact with the following medication:  acitretin  aspirin and aspirin-like medicines including salicylates  azathioprine  certain antibiotics like penicillins, tetracycline, and chloramphenicol  cyclosporine  gold  hydroxychloroquine  live virus vaccines  NSAIDs, medicines for pain and inflammation, like ibuprofen or naproxen  other cytotoxic agents  penicillamine  phenylbutazone  phenytoin  probenecid  retinoids such as isotretinoin and tretinoin  steroid medicines like prednisone or cortisone  sulfonamides like sulfasalazine and trimethoprim/sulfamethoxazole  theophylline This list may not describe all possible interactions. Give your health care provider a list of all the medicines, herbs, non-prescription drugs, or dietary supplements you use. Also tell them if you smoke, drink alcohol, or use illegal drugs. Some items may interact with your medicine. What should I watch for while using this medicine? Avoid alcoholic drinks. This medicine can make you more sensitive to the sun. Keep out of the sun. If you cannot avoid being in the sun, wear protective clothing and use sunscreen. Do not use sun lamps or tanning beds/booths. You may need blood work done while you are taking this medicine. Call your doctor or health care professional for advice if you get a fever, chills or sore throat, or other symptoms of a cold or flu. Do not treat yourself. This drug decreases your  body's ability to fight infections. Try to avoid being around people who are sick. This medicine may increase your risk to bruise or bleed. Call your doctor or health care professional if you notice any unusual  bleeding. Check with your doctor or health care professional if you get an attack of severe diarrhea, nausea and vomiting, or if you sweat a lot. The loss of too much body fluid can make it dangerous for you to take this medicine. Talk to your doctor about your risk of cancer. You may be more at risk for certain types of cancers if you take this medicine. Both men and women must use effective birth control with this medicine. Do not become pregnant while taking this medicine or until at least 1 normal menstrual cycle has occurred after stopping it. Women should inform their doctor if they wish to become pregnant or think they might be pregnant. Men should not father a child while taking this medicine and for 3 months after stopping it. There is a potential for serious side effects to an unborn child. Talk to your health care professional or pharmacist for more information. Do not breast-feed an infant while taking this medicine. What side effects may I notice from receiving this medicine? Side effects that you should report to your doctor or health care professional as soon as possible:  allergic reactions like skin rash, itching or hives, swelling of the face, lips, or tongue  breathing problems or shortness of breath  diarrhea  dry, nonproductive cough  low blood counts - this medicine may decrease the number of white blood cells, red blood cells and platelets. You may be at increased risk for infections and bleeding.  mouth sores  redness, blistering, peeling or loosening of the skin, including inside the mouth  signs of infection - fever or chills, cough, sore throat, pain or trouble passing urine  signs and symptoms of bleeding such as bloody or black, tarry stools; red or dark-brown urine; spitting up blood or brown material that looks like coffee grounds; red spots on the skin; unusual bruising or bleeding from the eye, gums, or nose  signs and symptoms of kidney injury like trouble  passing urine or change in the amount of urine  signs and symptoms of liver injury like dark yellow or brown urine; general ill feeling or flu-like symptoms; light-colored stools; loss of appetite; nausea; right upper belly pain; unusually weak or tired; yellowing of the eyes or skin Side effects that usually do not require medical attention (report to your doctor or health care professional if they continue or are bothersome):  dizziness  hair loss  tiredness  upset stomach  vomiting This list may not describe all possible side effects. Call your doctor for medical advice about side effects. You may report side effects to FDA at 1-800-FDA-1088. Where should I keep my medicine? Keep out of the reach of children. Store at room temperature between 20 and 25 degrees C (68 and 77 degrees F). Protect from light. Throw away any unused medicine after the expiration date. NOTE: This sheet is a summary. It may not cover all possible information. If you have questions about this medicine, talk to your doctor, pharmacist, or health care provider.  2020 Elsevier/Gold Standard (2017-06-05 13:38:43)

## 2020-08-30 ENCOUNTER — Ambulatory Visit (HOSPITAL_BASED_OUTPATIENT_CLINIC_OR_DEPARTMENT_OTHER): Payer: Self-pay | Admitting: Anesthesiology

## 2020-08-30 ENCOUNTER — Other Ambulatory Visit: Payer: Self-pay

## 2020-08-30 ENCOUNTER — Ambulatory Visit (HOSPITAL_BASED_OUTPATIENT_CLINIC_OR_DEPARTMENT_OTHER)
Admission: RE | Admit: 2020-08-30 | Discharge: 2020-08-30 | Disposition: A | Discharge status: Home / Self Care | Payer: Self-pay | Attending: Orthopaedic Surgery | Admitting: Orthopaedic Surgery

## 2020-08-30 ENCOUNTER — Encounter (HOSPITAL_BASED_OUTPATIENT_CLINIC_OR_DEPARTMENT_OTHER): Payer: Self-pay | Admitting: Orthopaedic Surgery

## 2020-08-30 ENCOUNTER — Encounter (HOSPITAL_BASED_OUTPATIENT_CLINIC_OR_DEPARTMENT_OTHER)
Admission: RE | Disposition: A | Discharge status: Home / Self Care | Payer: Self-pay | Source: Home / Self Care | Attending: Orthopaedic Surgery

## 2020-08-30 DIAGNOSIS — F17210 Nicotine dependence, cigarettes, uncomplicated: Secondary | ICD-10-CM | POA: Insufficient documentation

## 2020-08-30 DIAGNOSIS — Z885 Allergy status to narcotic agent status: Secondary | ICD-10-CM | POA: Insufficient documentation

## 2020-08-30 DIAGNOSIS — Z888 Allergy status to other drugs, medicaments and biological substances status: Secondary | ICD-10-CM | POA: Insufficient documentation

## 2020-08-30 DIAGNOSIS — Z887 Allergy status to serum and vaccine status: Secondary | ICD-10-CM | POA: Insufficient documentation

## 2020-08-30 DIAGNOSIS — G5601 Carpal tunnel syndrome, right upper limb: Secondary | ICD-10-CM | POA: Insufficient documentation

## 2020-08-30 HISTORY — PX: CARPAL TUNNEL RELEASE: SHX101

## 2020-08-30 HISTORY — DX: Carpal tunnel syndrome, right upper limb: G56.01

## 2020-08-30 LAB — GLUCOSE, CAPILLARY: Glucose-Capillary: 122 mg/dL — ABNORMAL HIGH (ref 70–99)

## 2020-08-30 SURGERY — CARPAL TUNNEL RELEASE
Anesthesia: Monitor Anesthesia Care | Site: Wrist | Laterality: Right

## 2020-08-30 MED ORDER — PROPOFOL 500 MG/50ML IV EMUL
INTRAVENOUS | Status: DC | PRN
  Administered 2020-08-30: 50 ug/kg/min via INTRAVENOUS

## 2020-08-30 MED ORDER — FENTANYL CITRATE (PF) 100 MCG/2ML IJ SOLN
INTRAMUSCULAR | Status: AC
  Filled 2020-08-30: qty 2

## 2020-08-30 MED ORDER — LIDOCAINE HCL (PF) 0.5 % IJ SOLN
INTRAMUSCULAR | Status: DC | PRN
  Administered 2020-08-30: 30 mL via INTRAVENOUS

## 2020-08-30 MED ORDER — DEXTROSE 5 % IV SOLN
3.0000 g | INTRAVENOUS | Status: AC
  Administered 2020-08-30: 3 g via INTRAVENOUS
  Filled 2020-08-30: qty 3000

## 2020-08-30 MED ORDER — MIDAZOLAM HCL 2 MG/2ML IJ SOLN
INTRAMUSCULAR | Status: AC
  Filled 2020-08-30: qty 2

## 2020-08-30 MED ORDER — SODIUM CHLORIDE 0.9 % IV SOLN: INTRAVENOUS | Status: DC

## 2020-08-30 MED ORDER — MIDAZOLAM HCL 5 MG/5ML IJ SOLN
INTRAMUSCULAR | Status: DC | PRN
  Administered 2020-08-30: 2 mg via INTRAVENOUS

## 2020-08-30 MED ORDER — LACTATED RINGERS IV SOLN: INTRAVENOUS | Status: DC

## 2020-08-30 MED ORDER — CEFAZOLIN SODIUM-DEXTROSE 1-4 GM/50ML-% IV SOLN
INTRAVENOUS | Status: AC
  Filled 2020-08-30: qty 50

## 2020-08-30 MED ORDER — PROPOFOL 10 MG/ML IV BOLUS
INTRAVENOUS | Status: AC
  Filled 2020-08-30: qty 20

## 2020-08-30 MED ORDER — ONDANSETRON HCL 4 MG/2ML IJ SOLN
INTRAMUSCULAR | Status: AC
  Filled 2020-08-30: qty 2

## 2020-08-30 MED ORDER — FENTANYL CITRATE (PF) 100 MCG/2ML IJ SOLN
INTRAMUSCULAR | Status: DC | PRN
  Administered 2020-08-30: 50 ug via INTRAVENOUS

## 2020-08-30 MED ORDER — BUPIVACAINE HCL (PF) 0.25 % IJ SOLN
INTRAMUSCULAR | Status: DC | PRN
  Administered 2020-08-30: 10 mL

## 2020-08-30 MED ORDER — FENTANYL CITRATE (PF) 100 MCG/2ML IJ SOLN: 25.0000 ug | INTRAMUSCULAR | Status: DC | PRN

## 2020-08-30 MED ORDER — OXYCODONE-ACETAMINOPHEN 5-325 MG PO TABS: 1.0000 | ORAL_TABLET | Freq: Three times a day (TID) | ORAL | 0 refills | Status: DC | PRN

## 2020-08-30 MED ORDER — PROPOFOL 10 MG/ML IV BOLUS
INTRAVENOUS | Status: DC | PRN
  Administered 2020-08-30: 20 mg via INTRAVENOUS

## 2020-08-30 MED ORDER — CEFAZOLIN SODIUM-DEXTROSE 2-4 GM/100ML-% IV SOLN
INTRAVENOUS | Status: AC
  Filled 2020-08-30: qty 100

## 2020-08-30 SURGICAL SUPPLY — 50 items
BAND INSRT 18 STRL LF DISP RB (MISCELLANEOUS) ×2
BAND RUBBER #18 3X1/16 STRL (MISCELLANEOUS) ×4 IMPLANT
BLADE MINI RND TIP GREEN BEAV (BLADE) ×2 IMPLANT
BLADE SURG 15 STRL LF DISP TIS (BLADE) ×1 IMPLANT
BLADE SURG 15 STRL SS (BLADE) ×2
BNDG CMPR 9X4 STRL LF SNTH (GAUZE/BANDAGES/DRESSINGS) ×1
BNDG ELASTIC 3X5.8 VLCR STR LF (GAUZE/BANDAGES/DRESSINGS) ×2 IMPLANT
BNDG ESMARK 4X9 LF (GAUZE/BANDAGES/DRESSINGS) ×2 IMPLANT
BNDG PLASTER X FAST 3X3 WHT LF (CAST SUPPLIES) IMPLANT
BNDG PLSTR 9X3 FST ST WHT (CAST SUPPLIES)
BRUSH SCRUB EZ PLAIN DRY (MISCELLANEOUS) ×2 IMPLANT
CANISTER SUCT 1200ML W/VALVE (MISCELLANEOUS) ×2 IMPLANT
CORD BIPOLAR FORCEPS 12FT (ELECTRODE) ×2 IMPLANT
COVER BACK TABLE 60X90IN (DRAPES) ×2 IMPLANT
COVER MAYO STAND STRL (DRAPES) ×2 IMPLANT
COVER WAND RF STERILE (DRAPES) IMPLANT
CUFF TOURN SGL QUICK 18X4 (TOURNIQUET CUFF) IMPLANT
DECANTER SPIKE VIAL GLASS SM (MISCELLANEOUS) IMPLANT
DRAPE EXTREMITY T 121X128X90 (DISPOSABLE) ×2 IMPLANT
DRAPE IMP U-DRAPE 54X76 (DRAPES) ×2 IMPLANT
DRAPE SURG 17X23 STRL (DRAPES) ×2 IMPLANT
GAUZE 4X4 16PLY RFD (DISPOSABLE) IMPLANT
GAUZE SPONGE 4X4 12PLY STRL (GAUZE/BANDAGES/DRESSINGS) ×2 IMPLANT
GAUZE XEROFORM 1X8 LF (GAUZE/BANDAGES/DRESSINGS) ×2 IMPLANT
GLOVE BIOGEL PI IND STRL 7.0 (GLOVE) ×1 IMPLANT
GLOVE BIOGEL PI INDICATOR 7.0 (GLOVE) ×1
GLOVE ECLIPSE 7.0 STRL STRAW (GLOVE) ×2 IMPLANT
GLOVE SKINSENSE NS SZ7.5 (GLOVE) ×1
GLOVE SKINSENSE STRL SZ7.5 (GLOVE) ×1 IMPLANT
GLOVE SURG SYN 7.5  E (GLOVE) ×2
GLOVE SURG SYN 7.5 E (GLOVE) ×1 IMPLANT
GLOVE SURG SYN 7.5 PF PI (GLOVE) ×1 IMPLANT
GOWN STRL REIN XL XLG (GOWN DISPOSABLE) ×2 IMPLANT
GOWN STRL REUS W/ TWL XL LVL3 (GOWN DISPOSABLE) ×2 IMPLANT
GOWN STRL REUS W/TWL XL LVL3 (GOWN DISPOSABLE) ×4
NDL HYPO 25X1 1.5 SAFETY (NEEDLE) IMPLANT
NEEDLE HYPO 25X1 1.5 SAFETY (NEEDLE) IMPLANT
NS IRRIG 1000ML POUR BTL (IV SOLUTION) ×2 IMPLANT
PACK BASIN DAY SURGERY FS (CUSTOM PROCEDURE TRAY) ×2 IMPLANT
PAD CAST 3X4 CTTN HI CHSV (CAST SUPPLIES) ×1 IMPLANT
PADDING CAST COTTON 3X4 STRL (CAST SUPPLIES) ×2
SHEET MEDIUM DRAPE 40X70 STRL (DRAPES) ×2 IMPLANT
STOCKINETTE 4X48 STRL (DRAPES) ×2 IMPLANT
SUT ETHILON 3 0 PS 1 (SUTURE) ×2 IMPLANT
SYR BULB EAR ULCER 3OZ GRN STR (SYRINGE) ×2 IMPLANT
SYR CONTROL 10ML LL (SYRINGE) IMPLANT
TOWEL GREEN STERILE FF (TOWEL DISPOSABLE) ×2 IMPLANT
TRAY DSU PREP LF (CUSTOM PROCEDURE TRAY) ×2 IMPLANT
TUBE CONNECTING 20X1/4 (TUBING) IMPLANT
UNDERPAD 30X36 HEAVY ABSORB (UNDERPADS AND DIAPERS) ×2 IMPLANT

## 2020-08-30 NOTE — Op Note (Signed)
   Carpal tunnel op note  DATE OF SURGERY:08/30/2020  PREOPERATIVE DIAGNOSIS:  Right carpal tunnel syndrome  POSTOPERATIVE DIAGNOSIS: same  PROCEDURE:  Right carpal tunnel release. CPT [id number]  SURGEON: Surgeon(s): Leandrew Koyanagi, MD  ASSIST: Madalyn Rob, PA-C; necessary for the timely completion of procedure and due to complexity of procedure.  ANESTHESIA:  Monitor Anesthesia Care  TOURNIQUET TIME: less than 20 minutes  BLOOD LOSS: Minimal.  COMPLICATIONS: None.  PATHOLOGY: None.  INDICATIONS: The patient is a 55 y.o. -year-old female who presented with carpal tunnel syndrome failing nonsurgical management, indicated for surgical release.  DESCRIPTION OF PROCEDURE: The patient was identified in the preoperative holding area.  The operative site was marked by the surgeon and confirmed by the patient.  He was brought back to the operating room.  Anesthesia was induced by the anesthesia team.  A well padded nonsterile tourniquet was placed. The operative extremity was prepped and draped in standard sterile fashion.  A timeout was performed.  Preoperative antibiotics were given.   A palmar incision was made about 5 mm ulnar to the thenar crease.  The palmar aponeurosis was exposed and divided in line with the skin incision. The palmaris brevis was visualized and divided.  The distal edge of the transcarpal ligament was identified. A hemostat was inserted into the carpal tunnel to protect the median nerve and the flexor tendons. Then, the transverse carpal ligament was released under direct visualization. Proximally, a subcutaneous tunnel was made allowing a Sewell retractor to be placed. Then, the distal portion of the antebrachial fascia was released. Distally, all fibrous bands were released. The median nerve was visualized, and the fat pad was exposed. Following release, local infiltration with 0.25% of Sensorcaine was given. The tourniquet was deflated. Hemostasis achieved.   Wound was irrigated and closed with 4-0 nylon sutures. Sterile dressing applied. The patient was transferred to the recovery room in stable condition after all counts were correct.  POSTOPERATIVE PLAN: To start nerve gliding exercises as tolerated and no heavy lifting for four weeks.  Eduard Roux, M.D. [facility] 1:20 PM

## 2020-08-30 NOTE — Transfer of Care (Signed)
Immediate Anesthesia Transfer of Care Note  Patient: Sydney Williams  Procedure(s) Performed: RIGHT CARPAL TUNNEL RELEASE (Right Wrist)  Patient Location: PACU  Anesthesia Type:MAC and Bier block  Level of Consciousness: sedated  Airway & Oxygen Therapy: Patient Spontanous Breathing and Patient connected to face mask oxygen  Post-op Assessment: Report given to RN and Post -op Vital signs reviewed and stable  Post vital signs: Reviewed and stable  Last Vitals:  Vitals Value Taken Time  BP 129/90 08/30/20 1330  Temp    Pulse 71 08/30/20 1335  Resp 14 08/30/20 1335  SpO2 100 % 08/30/20 1335  Vitals shown include unvalidated device data.  Last Pain:  Vitals:   08/30/20 1151  TempSrc: Oral  PainSc: 0-No pain      Patients Stated Pain Goal: 5 (60/63/01 6010)  Complications: No complications documented.

## 2020-08-30 NOTE — Discharge Instructions (Signed)
Postoperative instructions:  Weightbearing instructions: no lifting more than 10 lbs for 4 weeks  Dressing instructions: Keep your dressing and/or splint clean and dry at all times.  It will be removed at your first post-operative appointment.  Your stitches and/or staples will be removed at this visit.  Incision instructions:  Do not soak your incision for 3 weeks after surgery.  If the incision gets wet, pat dry and do not scrub the incision.  Pain control:  You have been given a prescription to be taken as directed for post-operative pain control.  In addition, elevate the operative extremity above the heart at all times to prevent swelling and throbbing pain.  Take over-the-counter Colace, 100mg  by mouth twice a day while taking narcotic pain medications to help prevent constipation.  Follow up appointments: 1) 7 days for wound check. 2) Dr. Erlinda Hong as scheduled.   -------------------------------------------------------------------------------------------------------------  After Surgery Pain Control:  After your surgery, post-surgical discomfort or pain is likely. This discomfort can last several days to a few weeks. At certain times of the day your discomfort may be more intense.  Did you receive a nerve block?  A nerve block can provide pain relief for one hour to two days after your surgery. As long as the nerve block is working, you will experience little or no sensation in the area the surgeon operated on.  As the nerve block wears off, you will begin to experience pain or discomfort. It is very important that you begin taking your prescribed pain medication before the nerve block fully wears off. Treating your pain at the first sign of the block wearing off will ensure your pain is better controlled and more tolerable when full-sensation returns. Do not wait until the pain is intolerable, as the medicine will be less effective. It is better to treat pain in advance than to try and  catch up.  General Anesthesia:  If you did not receive a nerve block during your surgery, you will need to start taking your pain medication shortly after your surgery and should continue to do so as prescribed by your surgeon.  Pain Medication:  Most commonly we prescribe Vicodin and Percocet for post-operative pain. Both of these medications contain a combination of acetaminophen (Tylenol) and a narcotic to help control pain.   It takes between 30 and 45 minutes before pain medication starts to work. It is important to take your medication before your pain level gets too intense.   Nausea is a common side effect of many pain medications. You will want to eat something before taking your pain medicine to help prevent nausea.   If you are taking a prescription pain medication that contains acetaminophen, we recommend that you do not take additional over the counter acetaminophen (Tylenol).  Other pain relieving options:   Using a cold pack to ice the affected area a few times a day (15 to 20 minutes at a time) can help to relieve pain, reduce swelling and bruising.   Elevation of the affected area can also help to reduce pain and swelling.      Post Anesthesia Home Care Instructions  Activity: Get plenty of rest for the remainder of the day. A responsible individual must stay with you for 24 hours following the procedure.  For the next 24 hours, DO NOT: -Drive a car -Paediatric nurse -Drink alcoholic beverages -Take any medication unless instructed by your physician -Make any legal decisions or sign important papers.  Meals: Start with  liquid foods such as gelatin or soup. Progress to regular foods as tolerated. Avoid greasy, spicy, heavy foods. If nausea and/or vomiting occur, drink only clear liquids until the nausea and/or vomiting subsides. Call your physician if vomiting continues.  Special Instructions/Symptoms: Your throat may feel dry or sore from the anesthesia or the  breathing tube placed in your throat during surgery. If this causes discomfort, gargle with warm salt water. The discomfort should disappear within 24 hours.  If you had a scopolamine patch placed behind your ear for the management of post- operative nausea and/or vomiting:  1. The medication in the patch is effective for 72 hours, after which it should be removed.  Wrap patch in a tissue and discard in the trash. Wash hands thoroughly with soap and water. 2. You may remove the patch earlier than 72 hours if you experience unpleasant side effects which may include dry mouth, dizziness or visual disturbances. 3. Avoid touching the patch. Wash your hands with soap and water after contact with the patch.       Regional Anesthesia Blocks  1. Numbness or the inability to move the "blocked" extremity may last from 3-48 hours after placement. The length of time depends on the medication injected and your individual response to the medication. If the numbness is not going away after 48 hours, call your surgeon.  2. The extremity that is blocked will need to be protected until the numbness is gone and the  Strength has returned. Because you cannot feel it, you will need to take extra care to avoid injury. Because it may be weak, you may have difficulty moving it or using it. You may not know what position it is in without looking at it while the block is in effect.  3. For blocks in the legs and feet, returning to weight bearing and walking needs to be done carefully. You will need to wait until the numbness is entirely gone and the strength has returned. You should be able to move your leg and foot normally before you try and bear weight or walk. You will need someone to be with you when you first try to ensure you do not fall and possibly risk injury.  4. Bruising and tenderness at the needle site are common side effects and will resolve in a few days.  5. Persistent numbness or new problems with  movement should be communicated to the surgeon or the [facility] [phone] [place] [phone]).

## 2020-08-30 NOTE — Anesthesia Preprocedure Evaluation (Addendum)
Anesthesia Evaluation  Patient identified by MRN, date of birth, ID band Patient awake    Reviewed: Allergy & Precautions, NPO status , Patient's Chart, lab work & pertinent test results, reviewed documented beta blocker date and time   Airway Mallampati: III  TM Distance: >3 FB Neck ROM: Full    Dental no notable dental hx. (+) Teeth Intact, Dental Advisory Given   Pulmonary asthma , Current Smoker and Patient abstained from smoking.,    Pulmonary exam normal breath sounds clear to auscultation       Cardiovascular hypertension, Pt. on home beta blockers and Pt. on medications Normal cardiovascular exam Rhythm:Regular Rate:Normal  HLD  LHC 2018 1.Tortuous left coronary artery. No angiographically significant coronary artery disease. 2.Normal left ventricular contraction and filling pressure.  TTE 2018 - Left ventricle: The cavity size was normal. There was mild concentric hypertrophy. Systolic function was normal. The estimated ejection fraction was in the range of 60% to 65%. Wall motion was normal; there were no regional wall motion abnormalities. Features are consistent with a pseudonormal left ventricular filling pattern, with concomitant abnormal relaxation and increased filling pressure (grade 2 diastolic dysfunction).  Doppler parameters are consistent with high ventricular fillingpressure.  - Aortic valve: Trileaflet; mildly thickened, mildly calcified  leaflets.  - Aorta: Ascending aorta diameter: 40 mm (ED).  - Ascending aorta: The ascending aorta was mildly dilated.  - Mitral valve: There was trivial regurgitation.  - Left atrium: The atrium was mildly dilated.    Neuro/Psych  Headaches, PSYCHIATRIC DISORDERS Depression    GI/Hepatic Neg liver ROS, GERD  Medicated,  Endo/Other  diabetes, Type 2, Oral Hypoglycemic AgentsMorbid obesity (BMI 42)  Renal/GU negative Renal ROS  negative genitourinary    Musculoskeletal negative musculoskeletal ROS (+)   Abdominal   Peds  Hematology negative hematology ROS (+)   Anesthesia Other Findings   Reproductive/Obstetrics                            Anesthesia Physical Anesthesia Plan  ASA: III  Anesthesia Plan: MAC and Bier Block and Bier Block-LIDOCAINE ONLY   Post-op Pain Management:  Regional for Post-op pain   Induction: Intravenous  PONV Risk Score and Plan: 1 and Propofol infusion, Treatment may vary due to age or medical condition and Midazolam  Airway Management Planned: Natural Airway  Additional Equipment:   Intra-op Plan:   Post-operative Plan:   Informed Consent: I have reviewed the patients History and Physical, chart, labs and discussed the procedure including the risks, benefits and alternatives for the proposed anesthesia with the patient or authorized representative who has indicated his/her understanding and acceptance.     Dental advisory given  Plan Discussed with: CRNA  Anesthesia Plan Comments:         Anesthesia Quick Evaluation

## 2020-08-30 NOTE — H&P (Signed)
PREOPERATIVE H&P  Chief Complaint: right carpal tunnel syndrome  HPI: Sydney Williams is a 54 y.o. female who presents for surgical treatment of right carpal tunnel syndrome.  She denies any changes in medical history.  Past Medical History:  Diagnosis Date  . Asthma    2015 hospitaliation for asthma  . Diabetes mellitus    vs impaired fasting glucose  . Family history of breast cancer   . Fibroids 2010  . Genital herpes   . GERD (gastroesophageal reflux disease)   . Hyperlipidemia   . Hypertension   . Impaired fasting blood sugar   . Migraines   . Obesity   . Right carpal tunnel syndrome   . Smoker    Past Surgical History:  Procedure Laterality Date  . COLONOSCOPY  03/13/2016   Dr. Carlean Purl, normal, repeat 2027  . CYST EXCISION     left neck/postauricular region, benign  . LAPAROSCOPIC ABDOMINAL EXPLORATION     removal of ectopic preg  . LEFT HEART CATH AND CORONARY ANGIOGRAPHY N/A 06/23/2017   Procedure: LEFT HEART CATH AND CORONARY ANGIOGRAPHY;  Surgeon: Nelva Bush, MD;  Location: [facility];  Service: Cardiovascular;  Laterality: N/A;  . TUBAL LIGATION     Social History   Socioeconomic History  . Marital status: Legally Separated    Spouse name: Not on file  . Number of children: Not on file  . Years of education: Not on file  . Highest education level: Not on file  Occupational History  . Not on file  Tobacco Use  . Smoking status: Current Every Day Smoker    Packs/day: 0.25    Years: 14.00    Pack years: 3.50    Types: Cigarettes  . Smokeless tobacco: Never Used  Vaping Use  . Vaping Use: Never used  Substance and Sexual Activity  . Alcohol use: Yes    Comment: occ  . Drug use: No  . Sexual activity: Yes    Birth control/protection: Surgical  Other Topics Concern  . Not on file  Social History Narrative   Married, and her granddaughter lives with her, works as Quarry manager at [employer], exercise - activity at work, walking.  12/2016    Social Determinants of Health   Financial Resource Strain:   . Difficulty of Paying Living Expenses: Not on file  Food Insecurity:   . Worried About Charity fundraiser in the Last Year: Not on file  . Ran Out of Food in the Last Year: Not on file  Transportation Needs:   . Lack of Transportation (Medical): Not on file  . Lack of Transportation (Non-Medical): Not on file  Physical Activity:   . Days of Exercise per Week: Not on file  . Minutes of Exercise per Session: Not on file  Stress:   . Feeling of Stress : Not on file  Social Connections:   . Frequency of Communication with Friends and Family: Not on file  . Frequency of Social Gatherings with Friends and Family: Not on file  . Attends Religious Services: Not on file  . Active Member of Clubs or Organizations: Not on file  . Attends Archivist Meetings: Not on file  . Marital Status: Not on file   Family History  Problem Relation Age of Onset  . Diabetes Mother   . Hypertension Mother   . Aneurysm Mother   . Stroke Mother   . Cancer Mother  cervical cancer  . Cancer Maternal Aunt        breast  . Cancer Cousin        breast/breast  . Lupus Cousin   . Heart disease Neg Hx   . Colon cancer Neg Hx   . Allergic rhinitis Neg Hx   . Angioedema Neg Hx   . Asthma Neg Hx   . Atopy Neg Hx   . Eczema Neg Hx   . Immunodeficiency Neg Hx   . Urticaria Neg Hx    Allergies  Allergen Reactions  . Lisinopril Swelling  . Potassium Chloride Shortness Of Breath and Swelling    Tolerates IV KCl, reaction only to PO product  . Pneumococcal Vaccines Swelling and Other (See Comments)    Reaction:  Swelling at injection site  . Vicodin [Hydrocodone-Acetaminophen] Itching and Rash   Prior to Admission medications   Medication Sig Start Date End Date Taking? Authorizing Provider  acetaminophen (TYLENOL) 500 MG tablet Take 1,000 mg by mouth every 6 (six) hours as needed.   Yes [provider]  albuterol  (VENTOLIN HFA) 108 (90 Base) MCG/ACT inhaler Inhale 1-2 puffs into the lungs every 6 (six) hours as needed for wheezing or shortness of breath. 04/03/20  Yes Elsie Stain, MD  amLODipine (NORVASC) 10 MG tablet Take 1 tablet (10 mg total) by mouth daily. To lower blood pressure 06/06/20  Yes Elsie Stain, MD  aspirin (EQ ASPIRIN ADULT LOW DOSE) 81 MG EC tablet Take 1 tablet (81 mg total) by mouth daily. Swallow whole. 02/03/18  Yes Tysinger, Camelia Eng, PA-C  atorvastatin (LIPITOR) 40 MG tablet Take 1 tablet (40 mg total) by mouth daily. 06/06/20  Yes Elsie Stain, MD  baclofen (LIORESAL) 10 MG tablet TAKE 1-2 TABLETS (10-20 MG TOTAL) BY MOUTH 3 (THREE) TIMES DAILY AS NEEDED FOR MUSCLE SPASMS. 07/05/20  Yes Hilts, Legrand Como, MD  Fluticasone-Umeclidin-Vilant (TRELEGY ELLIPTA) 200-62.5-25 MCG/INH AEPB Inhale 1 puff into the lungs daily. Rinse mouth after each use. 07/19/20  Yes Garnet Sierras, DO  furosemide (LASIX) 20 MG tablet One pill once per day if needed for swelling 06/06/20  Yes Elsie Stain, MD  gabapentin (NEURONTIN) 100 MG capsule 2 in morning, 2 in late afternoon and 1-2 at bedtime Patient taking differently: 1 in the morning, 1 at night 04/03/20  Yes Elsie Stain, MD  hydrochlorothiazide (HYDRODIURIL) 25 MG tablet Take 1 tablet (25 mg total) by mouth daily. To lower blood pressure 06/06/20  Yes Elsie Stain, MD  ibuprofen (ADVIL) 600 MG tablet TAKE 1 TABLET (600 MG TOTAL) BY MOUTH EVERY 8 (EIGHT) HOURS AS NEEDED FOR MODERATE PAIN. TAKE AFTER EATING 07/17/20  Yes Fulp, Cammie, MD  metFORMIN (GLUCOPHAGE) 500 MG tablet Take 1 tablet (500 mg total) by mouth 2 (two) times daily with a meal. 06/06/20  Yes Elsie Stain, MD  metoprolol tartrate (LOPRESSOR) 50 MG tablet Take 1 tablet (50 mg total) by mouth 2 (two) times daily. 06/06/20  Yes Elsie Stain, MD  montelukast (SINGULAIR) 10 MG tablet Take 1 tablet (10 mg total) by mouth at bedtime. Patient taking differently: Take 10 mg by  mouth as needed.  07/19/20  Yes Garnet Sierras, DO  omeprazole (PRILOSEC) 40 MG capsule Take 1 capsule (40 mg total) by mouth 2 (two) times daily. To decrease stomach acid 09/16/19  Yes Fulp, Cammie, MD  Vitamin D, Cholecalciferol, 25 MCG (1000 UT) TABS Take 1 tablet by mouth once daily 02/18/19  Yes Tysinger, Camelia Eng, PA-C  acyclovir (ZOVIRAX) 200 MG capsule Take 1 capsule (200 mg total) by mouth 2 (two) times daily. Patient not taking: Reported on 08/29/2020 04/03/20   Elsie Stain, MD  albuterol (ACCUNEB) 0.63 MG/3ML nebulizer solution Take 1 ampule by nebulization every 6 (six) hours as needed for wheezing.    [provider]  diphenhydrAMINE (BENADRYL) 25 MG tablet Take 1 tablet (25 mg total) by mouth every 6 (six) hours as needed. 01/09/20   Tedd Sias, PA  oxyCODONE-acetaminophen (PERCOCET) 5-325 MG tablet Take 1 tablet by mouth every 8 (eight) hours as needed for severe pain. 08/30/20   Leandrew Koyanagi, MD     Positive ROS: All other systems have been reviewed and were otherwise negative with the exception of those mentioned in the HPI and as above.  Physical Exam: General: Alert, no acute distress Cardiovascular: No pedal edema Respiratory: No cyanosis, no use of accessory musculature GI: abdomen soft Skin: No lesions in the area of chief complaint Neurologic: Sensation intact distally Psychiatric: Patient is competent for consent with normal mood and affect Lymphatic: no lymphedema  MUSCULOSKELETAL: exam stable  Assessment: right carpal tunnel syndrome  Plan: Plan for Procedure(s): RIGHT CARPAL TUNNEL RELEASE  The risks benefits and alternatives were discussed with the patient including but not limited to the risks of nonoperative treatment, versus surgical intervention including infection, bleeding, nerve injury,  blood clots, cardiopulmonary complications, morbidity, mortality, among others, and they were willing to proceed.   Preoperative templating of the joint  replacement has been completed, documented, and submitted to the Operating Room personnel in order to optimize intra-operative equipment management.   Eduard Roux, MD 08/30/2020 12:54 PM

## 2020-08-31 ENCOUNTER — Other Ambulatory Visit: Payer: Self-pay | Admitting: Critical Care Medicine

## 2020-08-31 ENCOUNTER — Encounter (HOSPITAL_BASED_OUTPATIENT_CLINIC_OR_DEPARTMENT_OTHER): Payer: Self-pay | Admitting: Orthopaedic Surgery

## 2020-08-31 DIAGNOSIS — J4540 Moderate persistent asthma, uncomplicated: Secondary | ICD-10-CM

## 2020-08-31 MED FILL — ALBUTEROL SULFATE HFA 108 (: 108 (90 BAS | 25 days supply | Qty: 18 | Fill #0

## 2020-08-31 MED FILL — TRELEGY ELLIPTA 200-62.5-25: 200-62.5-25 | 30 days supply | Qty: 60 | Fill #1

## 2020-08-31 NOTE — Anesthesia Postprocedure Evaluation (Signed)
Anesthesia Post Note  Patient: Sydney Williams  Procedure(s) Performed: RIGHT CARPAL TUNNEL RELEASE (Right Wrist)     Patient location during evaluation: PACU Anesthesia Type: MAC and Bier Block Level of consciousness: awake and alert Pain management: pain level controlled Vital Signs Assessment: post-procedure vital signs reviewed and stable Respiratory status: spontaneous breathing, nonlabored ventilation, respiratory function stable and patient connected to nasal cannula oxygen Cardiovascular status: stable and blood pressure returned to baseline Postop Assessment: no apparent nausea or vomiting Anesthetic complications: no   No complications documented.  Last Vitals:  Vitals:   08/30/20 1405 08/30/20 1447  BP:  137/89  Pulse: 74 78  Resp: 19 18  Temp:  36.6 C  SpO2: 95% 94%    Last Pain:  Vitals:   08/30/20 1447  TempSrc:   PainSc: 0-No pain   Pain Goal: Patients Stated Pain Goal: 5 (08/30/20 1151)                 [place]

## 2020-09-06 ENCOUNTER — Ambulatory Visit (INDEPENDENT_AMBULATORY_CARE_PROVIDER_SITE_OTHER): Payer: Medicaid Other | Admitting: Physician Assistant

## 2020-09-06 ENCOUNTER — Ambulatory Visit: Payer: Self-pay | Attending: Critical Care Medicine | Admitting: Critical Care Medicine

## 2020-09-06 ENCOUNTER — Ambulatory Visit (HOSPITAL_BASED_OUTPATIENT_CLINIC_OR_DEPARTMENT_OTHER): Payer: Medicaid Other | Admitting: Pharmacist

## 2020-09-06 ENCOUNTER — Encounter: Payer: Self-pay | Admitting: Orthopaedic Surgery

## 2020-09-06 ENCOUNTER — Other Ambulatory Visit: Payer: Self-pay

## 2020-09-06 ENCOUNTER — Encounter: Payer: Self-pay | Admitting: Critical Care Medicine

## 2020-09-06 VITALS — BP 128/85 | HR 70 | Resp 16 | Wt 282.8 lb

## 2020-09-06 DIAGNOSIS — J4540 Moderate persistent asthma, uncomplicated: Secondary | ICD-10-CM

## 2020-09-06 DIAGNOSIS — J45909 Unspecified asthma, uncomplicated: Secondary | ICD-10-CM

## 2020-09-06 DIAGNOSIS — E559 Vitamin D deficiency, unspecified: Secondary | ICD-10-CM

## 2020-09-06 DIAGNOSIS — Z791 Long term (current) use of non-steroidal anti-inflammatories (NSAID): Secondary | ICD-10-CM | POA: Insufficient documentation

## 2020-09-06 DIAGNOSIS — M0600 Rheumatoid arthritis without rheumatoid factor, unspecified site: Secondary | ICD-10-CM

## 2020-09-06 DIAGNOSIS — Z720 Tobacco use: Secondary | ICD-10-CM

## 2020-09-06 DIAGNOSIS — H1013 Acute atopic conjunctivitis, bilateral: Secondary | ICD-10-CM

## 2020-09-06 DIAGNOSIS — Z7984 Long term (current) use of oral hypoglycemic drugs: Secondary | ICD-10-CM | POA: Insufficient documentation

## 2020-09-06 DIAGNOSIS — G5601 Carpal tunnel syndrome, right upper limb: Secondary | ICD-10-CM

## 2020-09-06 DIAGNOSIS — E669 Obesity, unspecified: Secondary | ICD-10-CM

## 2020-09-06 DIAGNOSIS — E790 Hyperuricemia without signs of inflammatory arthritis and tophaceous disease: Secondary | ICD-10-CM

## 2020-09-06 DIAGNOSIS — T50A95D Adverse effect of other bacterial vaccines, subsequent encounter: Secondary | ICD-10-CM

## 2020-09-06 DIAGNOSIS — Z9889 Other specified postprocedural states: Secondary | ICD-10-CM

## 2020-09-06 DIAGNOSIS — E119 Type 2 diabetes mellitus without complications: Secondary | ICD-10-CM

## 2020-09-06 DIAGNOSIS — M5416 Radiculopathy, lumbar region: Secondary | ICD-10-CM

## 2020-09-06 DIAGNOSIS — Z23 Encounter for immunization: Secondary | ICD-10-CM

## 2020-09-06 DIAGNOSIS — I10 Essential (primary) hypertension: Secondary | ICD-10-CM

## 2020-09-06 DIAGNOSIS — Z79899 Other long term (current) drug therapy: Secondary | ICD-10-CM | POA: Insufficient documentation

## 2020-09-06 DIAGNOSIS — Z7982 Long term (current) use of aspirin: Secondary | ICD-10-CM | POA: Insufficient documentation

## 2020-09-06 DIAGNOSIS — T887XXD Unspecified adverse effect of drug or medicament, subsequent encounter: Secondary | ICD-10-CM

## 2020-09-06 DIAGNOSIS — J3089 Other allergic rhinitis: Secondary | ICD-10-CM

## 2020-09-06 DIAGNOSIS — F17210 Nicotine dependence, cigarettes, uncomplicated: Secondary | ICD-10-CM | POA: Insufficient documentation

## 2020-09-06 DIAGNOSIS — E782 Mixed hyperlipidemia: Secondary | ICD-10-CM

## 2020-09-06 LAB — POCT GLYCOSYLATED HEMOGLOBIN (HGB A1C): HbA1c, POC (prediabetic range): 6.4 % (ref 5.7–6.4)

## 2020-09-06 LAB — GLUCOSE, POCT (MANUAL RESULT ENTRY): POC Glucose: 94 mg/dl (ref 70–99)

## 2020-09-06 MED ORDER — GABAPENTIN 100 MG PO CAPS: ORAL_CAPSULE | ORAL | 3 refills | Status: DC

## 2020-09-06 MED ORDER — OXYCODONE-ACETAMINOPHEN 5-325 MG PO TABS
1.0000 | ORAL_TABLET | Freq: Two times a day (BID) | ORAL | 0 refills | Status: DC | PRN
Start: 2020-09-06 — End: 2020-10-27

## 2020-09-06 NOTE — Assessment & Plan Note (Signed)
Patient recommended more weight loss measures with dietary changes  Continue Metformin

## 2020-09-06 NOTE — Assessment & Plan Note (Signed)
Plan per Dr. Louanne Skye once other conditions are stabilized she recently is planning back surgery

## 2020-09-06 NOTE — Assessment & Plan Note (Signed)
Continue allopurinol 

## 2020-09-06 NOTE — Progress Notes (Signed)
Patient presents for vaccination against Influenza per orders of Dr. Joya Gaskins. Consent given. Counseling provided. No contraindications exists. Vaccine administered without incident.   Lorel Monaco, PharmD PGY2 Ambulatory Care Resident [place]

## 2020-09-06 NOTE — Assessment & Plan Note (Signed)
As per allergy

## 2020-09-06 NOTE — Assessment & Plan Note (Signed)
As per allergy she will follow up with allergy

## 2020-09-06 NOTE — Assessment & Plan Note (Signed)
Hypertension stable at this time no change in medications refills given

## 2020-09-06 NOTE — Assessment & Plan Note (Signed)
Moderate persistent asthma with allergy evaluation showing significant allergies to weeds and grasses tree pollens  Patient failed Singulair due to side effects  Continue Flonase continue Trelegy and albuterol as prescribed

## 2020-09-06 NOTE — Assessment & Plan Note (Signed)
As per rheumatology

## 2020-09-06 NOTE — Assessment & Plan Note (Signed)
Morbid obesity patient given dietary recommendations

## 2020-09-06 NOTE — Assessment & Plan Note (Signed)
Prior higher pneumococcal vaccine therefore she is not a candidate for this despite the diabetes diagnosis

## 2020-09-06 NOTE — Assessment & Plan Note (Signed)
Continued smoking cessation recommended    . Current smoking consumption amount: 3 to 5 cigarettes daily  . Dicsussion on advise to quit smoking and smoking impacts: Impact on lung health  . Patient's willingness to quit: Willing to quit  . Methods to quit smoking discussed: Behavioral modification nicotine replacement  . Medication management of smoking session drugs discussed: Nicotine replacement  . Setting quit date not yet set  . Follow-up arranged short-term follow-up   Time spent counseling the patient: 5 minutes

## 2020-09-06 NOTE — Progress Notes (Signed)
Subjective:    Patient ID: Sydney Williams, female    DOB: 03/24/1966, 54 y.o.   MRN: [id number]  54 y.o.F hx HTN asthma obesity, smoker, HL  This is a follow-up primary care visit and actually patient was seen previously in May for a post ER visit for left shoulder and hand pain.  It turns out this patient's had chronic low back pain and has been to orthopedics and has had MRIs done has been offered surgery but she is awaiting financial assistance as she does not have any active insurance at this time.  Note this patient's been using a set of crutches since November because of her back.  She noted more recently the left axilla was becoming more irritated causing numbness in the upper arm all the way down to the hand with pain in the hand itself.  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 Louanne Skye.  Patient comes in today for cardiac and medical clearance for planned surgery.  She does have moderate persistent asthma and history of diastolic cardiac dysfunction from severe hypertension.  She had a cardiac cath in 2018 showed no coronary abnormalities.  She history of elevated uric acid and is on allopurinol and vitamin D deficiency on vitamin D supplementation.  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  Hypertension has been very well controlled on beta-blocker, amlodipine,, And hydrochlorthiazide The patient also has moderate persistent asthma and is on Advair inhaler and has had no recent exacerbations.  She also maintains a baby aspirin daily.  Patient does have diabetes type 2  and is on the Metformin daily.  Recent hemoglobin A1c was 6.4  The patient's had no active issues of chest pain or significant shortness of breath.  He does complain of bilateral hand pain that is worsening.  He does not have a walker in order to ambulate with her back.  She is using Tylenol extra strength and baclofen to control her pain.  Tramadol was noted no use.  She cannot take oxycodone due to allergies.  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  She is due up a mammogram.  She also would like an allergy referral in network for her asthma.  She continues to have dental issues and has in fact gone to dental care which have improved her issues and this was covered by her orange card.  Note on arrival blood glucose was 96  09/06/2020 [name] returns today for primary care follow-up.  Note the patient was planning to have lumbar surgery but had to delay this for other reasons.  She does maintain her Metformin twice daily and on arrival hemoglobin A1c was 6.4.  The patient maintains allopurinol for uric acid levels that are elevated.  The patient has quit smoking at this time.  Her asthma appears to be stable at this time as well.  The patient states she stopped the Cymbalta because it caused brain fog.  She is yet to receive her mammogram.  She saw allergy and had multiple multiple  skin test positive for grass and weeds and tree pollen.  There were no food allergies.  She was started on Singulair but this caused side effects so she stopped this medication.  She is on the Flonase and Claritin.  Maintains her Trelegy inhaler daily.  At the last visit her microalbumin levels were normal. The patient does maintain atorvastatin for cholesterol.  Since the last visit the patient did have carpal tunnel release on the right hand and is recovering from this and is planning surgery in the left hand as well in the future.  She does work as a Quarry manager  The patient  is also been seen by rheumatology because of multiple joint pain and is been diagnosed seronegative rheumatoid arthritis.  The patient is being considered for methotrexate but she is reluctant to receive this.  If the patient does need a DMARD treatment we could potentially give her patient assistance through our clinic since she is self-pay for any potential medication the rheumatologist desires   Past Medical History:  Diagnosis Date  . Asthma    2015 hospitaliation for asthma  . Diabetes mellitus    vs impaired fasting glucose  . Family history of breast cancer   . Fibroids 2010  . Genital herpes   . Genital herpes 11/30/2014  . GERD (gastroesophageal reflux disease)   . Hyperlipidemia   . Hypertension   . Impaired fasting blood sugar   . Migraines   . Obesity   . Right carpal tunnel syndrome   . Smoker      Family History  Problem Relation Age of Onset  . Diabetes Mother   . Hypertension Mother   . Aneurysm Mother   . Stroke Mother   . Cancer Mother        cervical cancer  . Cancer Maternal Aunt        breast  . Cancer Cousin        breast/breast  . Lupus Cousin   . Heart disease Neg Hx   . Colon cancer Neg Hx   . Allergic rhinitis Neg Hx   . Angioedema Neg Hx   . Asthma Neg Hx   . Atopy Neg Hx   . Eczema Neg Hx   . Immunodeficiency Neg Hx   . Urticaria Neg Hx      Social History   Socioeconomic History  . Marital status: Legally Separated    Spouse name: Not on file  . Number of children: Not on file  . Years of education: Not on file  . Highest education level: Not on file  Occupational History  . Not on file  Tobacco Use  . Smoking status: Current Every Day Smoker    Packs/day: 0.25    Years: 14.00    Pack years: 3.50    Types: Cigarettes  . Smokeless tobacco: Never Used  Vaping Use  . Vaping Use: Never used  Substance and Sexual Activity  . Alcohol use: Yes    Comment: occ  . Drug use: No  . Sexual activity: Yes    Birth control/protection:  Surgical  Other Topics Concern  . Not on file  Social History Narrative   Married, and her granddaughter lives with her, works as Quarry manager at [employer], exercise - activity at work, walking.  12/2016   Social Determinants of Health   Financial Resource Strain:   . Difficulty of Paying Living Expenses: Not on file  Food Insecurity:   . Worried About Charity fundraiser  in the Last Year: Not on file  . Ran Out of Food in the Last Year: Not on file  Transportation Needs:   . Lack of Transportation (Medical): Not on file  . Lack of Transportation (Non-Medical): Not on file  Physical Activity:   . Days of Exercise per Week: Not on file  . Minutes of Exercise per Session: Not on file  Stress:   . Feeling of Stress : Not on file  Social Connections:   . Frequency of Communication with Friends and Family: Not on file  . Frequency of Social Gatherings with Friends and Family: Not on file  . Attends Religious Services: Not on file  . Active Member of Clubs or Organizations: Not on file  . Attends Archivist Meetings: Not on file  . Marital Status: Not on file  Intimate Partner Violence:   . Fear of Current or Ex-Partner: Not on file  . Emotionally Abused: Not on file  . Physically Abused: Not on file  . Sexually Abused: Not on file     Allergies  Allergen Reactions  . Lisinopril Swelling  . Potassium Chloride Shortness Of Breath and Swelling    Tolerates IV KCl, reaction only to PO product  . Pneumococcal Vaccines Swelling and Other (See Comments)    Reaction:  Swelling at injection site  . Vicodin [Hydrocodone-Acetaminophen] Itching and Rash     Outpatient Medications Prior to Visit  Medication Sig Dispense Refill  . acetaminophen (TYLENOL) 500 MG tablet Take 1,000 mg by mouth every 6 (six) hours as needed.    Marland Kitchen acyclovir (ZOVIRAX) 200 MG capsule Take 1 capsule (200 mg total) by mouth 2 (two) times daily. 180 capsule 3  . albuterol (ACCUNEB) 0.63 MG/3ML nebulizer  solution Take 1 ampule by nebulization every 6 (six) hours as needed for wheezing.    Marland Kitchen albuterol (VENTOLIN HFA) 108 (90 Base) MCG/ACT inhaler INHALE 1-2 PUFFS INTO THE LUNGS EVERY 6 (SIX) HOURS AS NEEDED FOR WHEEZING OR SHORTNESS OF BREATH. 18 g 0  . amLODipine (NORVASC) 10 MG tablet Take 1 tablet (10 mg total) by mouth daily. To lower blood pressure 90 tablet 0  . aspirin (EQ ASPIRIN ADULT LOW DOSE) 81 MG EC tablet Take 1 tablet (81 mg total) by mouth daily. Swallow whole. 90 tablet 3  . atorvastatin (LIPITOR) 40 MG tablet Take 1 tablet (40 mg total) by mouth daily. 30 tablet 2  . baclofen (LIORESAL) 10 MG tablet TAKE 1-2 TABLETS (10-20 MG TOTAL) BY MOUTH 3 (THREE) TIMES DAILY AS NEEDED FOR MUSCLE SPASMS. 90 tablet 1  . diphenhydrAMINE (BENADRYL) 25 MG tablet Take 1 tablet (25 mg total) by mouth every 6 (six) hours as needed. 30 tablet 0  . Fluticasone-Umeclidin-Vilant (TRELEGY ELLIPTA) 200-62.5-25 MCG/INH AEPB Inhale 1 puff into the lungs daily. Rinse mouth after each use. 28 each 2  . furosemide (LASIX) 20 MG tablet One pill once per day if needed for swelling 20 tablet 0  . hydrochlorothiazide (HYDRODIURIL) 25 MG tablet Take 1 tablet (25 mg total) by mouth daily. To lower blood pressure 90 tablet 3  . ibuprofen (ADVIL) 600 MG tablet TAKE 1 TABLET (600 MG TOTAL) BY MOUTH EVERY 8 (EIGHT) HOURS AS NEEDED FOR MODERATE PAIN. TAKE AFTER EATING 90 tablet 1  . metFORMIN (GLUCOPHAGE) 500 MG tablet Take 1 tablet (500 mg total) by mouth 2 (two) times daily with a meal. 90 tablet 1  . metoprolol tartrate (LOPRESSOR) 50 MG tablet Take 1 tablet (50 mg total)  by mouth 2 (two) times daily. 60 tablet 2  . omeprazole (PRILOSEC) 40 MG capsule Take 1 capsule (40 mg total) by mouth 2 (two) times daily. To decrease stomach acid 60 capsule 3  . oxyCODONE-acetaminophen (PERCOCET) 5-325 MG tablet Take 1 tablet by mouth 2 (two) times daily as needed. 20 tablet 0  . Vitamin D, Cholecalciferol, 25 MCG (1000 UT) TABS Take 1  tablet by mouth once daily 90 tablet 0  . gabapentin (NEURONTIN) 100 MG capsule 2 in morning, 2 in late afternoon and 1-2 at bedtime (Patient taking differently: 1 in the morning, 1 at night) 180 capsule 3  . montelukast (SINGULAIR) 10 MG tablet Take 1 tablet (10 mg total) by mouth at bedtime. (Patient not taking: Reported on 09/06/2020) 30 tablet 5   No facility-administered medications prior to visit.     Review of Systems  Constitutional: Positive for activity change.  HENT: Negative.   Respiratory: Negative.   Cardiovascular: Negative.   Gastrointestinal: Negative.   Musculoskeletal: Positive for back pain and gait problem.       Objective:   Physical Exam Vitals:   09/06/20 0858  BP: 128/85  Pulse: 70  Resp: 16  SpO2: 98%  Weight: 282 lb 12.8 oz (128.3 kg)    Gen: Pleasant, obese, in no distress,  normal affect  ENT: No lesions,  mouth clear,  oropharynx clear, no postnasal drip  Neck: No JVD, no TMG, no carotid bruits  Lungs: No use of accessory muscles, no dullness to percussion, clear without rales or rhonchi  Cardiovascular: RRR, heart sounds normal, no murmur or gallops, no peripheral edema  Abdomen: soft and NT, no HSM,  BS normal  Musculoskeletal: No deformities, no cyanosis or clubbing, there is a wrist splint on the right forearm  Neuro: alert, non focal,  Skin: Warm, no lesions or rashes   Hemoglobin A1c was 6.4     Assessment & Plan:  I personally reviewed all images and lab data in the [facility] system as well as any outside material available during this office visit and agree with the  radiology impressions.   Essential hypertension Hypertension stable at this time no change in medications refills given  Moderate persistent asthma Moderate persistent asthma with allergy evaluation showing significant allergies to weeds and grasses tree pollens  Patient failed Singulair due to side effects  Continue Flonase continue Trelegy and albuterol as  prescribed  Not well controlled asthma without complication As per allergy she will follow up with allergy  Other allergic rhinitis As per allergy  Type 2 diabetes mellitus without complication, without long-term current use of insulin (HCC) Patient recommended more weight loss measures with dietary changes  Continue Metformin  Lumbar radiculopathy, chronic Plan per Dr. Louanne Skye once other conditions are stabilized she recently is planning back surgery  Seronegative rheumatoid arthritis ([place]) As per rheumatology  Elevated blood uric acid level Continue allopurinol  Hyperlipidemia Continue atorvastatin as prescribed  Obesity Morbid obesity patient given dietary recommendations  Tobacco user Continued smoking cessation recommended    . Current smoking consumption amount: 3 to 5 cigarettes daily  . Dicsussion on advise to quit smoking and smoking impacts: Impact on lung health  . Patient's willingness to quit: Willing to quit  . Methods to quit smoking discussed: Behavioral modification nicotine replacement  . Medication management of smoking session drugs discussed: Nicotine replacement  . Setting quit date not yet set  . 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

## 2020-09-06 NOTE — Patient Instructions (Addendum)
Influenza Virus Vaccine injection (Fluarix) What is this medicine? INFLUENZA VIRUS VACCINE (in floo EN zuh VAHY ruhs vak SEEN) helps to reduce the risk of getting influenza also known as the flu. This medicine may be used for other purposes; ask your health care provider or pharmacist if you have questions. COMMON BRAND NAME(S): Fluarix, Fluzone What should I tell my health care provider before I take this medicine? They need to know if you have any of these conditions:  bleeding disorder like hemophilia  fever or infection  Guillain-Barre syndrome or other neurological problems  immune system problems  infection with the human immunodeficiency virus (HIV) or AIDS  low blood platelet counts  multiple sclerosis  an unusual or allergic reaction to influenza virus vaccine, eggs, chicken proteins, latex, gentamicin, other medicines, foods, dyes or preservatives  pregnant or trying to get pregnant  breast-feeding How should I use this medicine? This vaccine is for injection into a muscle. It is given by a health care professional. A copy of Vaccine Information Statements will be given before each vaccination. Read this sheet carefully each time. The sheet may change frequently. Talk to your pediatrician regarding the use of this medicine in children. Special care may be needed. Overdosage: If you think you have taken too much of this medicine contact a poison control center or emergency room at once. NOTE: This medicine is only for you. Do not share this medicine with others. What if I miss a dose? This does not apply. What may interact with this medicine?  chemotherapy or radiation therapy  medicines that lower your immune system like etanercept, anakinra, infliximab, and adalimumab  medicines that treat or prevent blood clots like warfarin  phenytoin  steroid medicines like prednisone or cortisone  theophylline  vaccines This list may not describe all possible  interactions. Give your health care provider a list of all the medicines, herbs, non-prescription drugs, or dietary supplements you use. Also tell them if you smoke, drink alcohol, or use illegal drugs. Some items may interact with your medicine. What should I watch for while using this medicine? Report any side effects that do not go away within 3 days to your doctor or health care professional. Call your health care provider if any unusual symptoms occur within 6 weeks of receiving this vaccine. You may still catch the flu, but the illness is not usually as bad. You cannot get the flu from the vaccine. The vaccine will not protect against colds or other illnesses that may cause fever. The vaccine is needed every year. What side effects may I notice from receiving this medicine? Side effects that you should report to your doctor or health care professional as soon as possible:  allergic reactions like skin rash, itching or hives, swelling of the face, lips, or tongue Side effects that usually do not require medical attention (report to your doctor or health care professional if they continue or are bothersome):  fever  headache  muscle aches and pains  pain, tenderness, redness, or swelling at site where injected  weak or tired This list may not describe all possible side effects. Call your doctor for medical advice about side effects. You may report side effects to FDA at 1-800-FDA-1088. Where should I keep my medicine? This vaccine is only given in a clinic, pharmacy, doctor's office, or other health care setting and will not be stored at home. NOTE: This sheet is a summary. It may not cover all possible information. If you have questions   about this medicine, talk to your doctor, pharmacist, or health care provider.  2020 Elsevier/Gold Standard (2008-05-11 09:30:40)   Please get your eyes examined I recommend [place]  Your mammogram will be establish through the  scholarship program  Let your rheumatologist know we may be able to acquire a rheumatology medication for your joints through our patient assistance program if needed  Return 3 months

## 2020-09-06 NOTE — Progress Notes (Signed)
Post-Op Visit Note   Patient: Sydney Williams           Date of Birth: 07/22/1966           MRN: [id number] Visit Date: 09/06/2020 PCP: Elsie Stain, MD   Assessment & Plan:  Chief Complaint:  Chief Complaint  Patient presents with  . Right Hand - Pain, Routine Post Op   Visit Diagnoses:  1. S/P carpal tunnel release   2. Carpal tunnel syndrome on right     Plan: Patient is a very pleasant 54 year old female who comes in today 1 week out right carpal tunnel release.  She has been doing well.  She has been taking oxycodone as needed when she is allergic to Norco.  She denies any numbness, tingling or burning.  Examination of her right wrist reveals a well-healing surgical incision with nylon sutures in place.  No evidence of infection or cellulitis.  Fingers are warm and well-perfused.  Today, her wound was recovered and she was placed in a removable splint.  She will avoid any heavy lifting or submerging her hand in water for another 3 weeks.  Follow-up with [place] in 1 week's time for repeat evaluation and probable suture removal.  Call with concerns or questions in meantime.  Have agreed to call in 1 more prescription of Percocet to take as needed.  Follow-Up Instructions: Return in about 1 week (around 09/13/2020).   Orders:  No orders of the defined types were placed in this encounter.  Meds ordered this encounter  Medications  . oxyCODONE-acetaminophen (PERCOCET) 5-325 MG tablet    Sig: Take 1 tablet by mouth 2 (two) times daily as needed.    Dispense:  20 tablet    Refill:  0    Imaging: No new imaging  PMFS History: Patient Active Problem List   Diagnosis Date Noted  . Carpal tunnel syndrome on right   . Seronegative rheumatoid arthritis ([place]) 08/04/2020  . Allergic conjunctivitis of both eyes 07/19/2020  . Lumbar radiculopathy, chronic 04/03/2020  . Elevated blood uric acid level 04/03/2020  . Family history of cervical cancer 02/02/2018  . Family history of  breast cancer 02/02/2018  . Vitamin D deficiency 02/02/2018  . Depression 06/19/2017  . Hypomagnesemia 06/19/2017  . 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 Snoring 09/11/2016  . History of migraine 09/11/2016  . Frequent headaches 09/11/2016  . Encounter for health maintenance examination in adult 12/04/2015  . Moderate persistent asthma 06/01/2015  . Gastroesophageal reflux disease without esophagitis 06/01/2015  . Other allergic rhinitis 06/01/2015  . Genital herpes 11/30/2014  . Tobacco user 11/30/2014  . Essential hypertension 11/30/2014  . Hyperlipidemia 11/30/2014  . Impaired fasting blood sugar 11/30/2014  . Not well controlled asthma without complication 08/27/5944  . Obesity 07/02/2012  . Diabetes mellitus without complication ([place]) 85/92/9244   Past Medical History:  Diagnosis Date  . Asthma    2015 hospitaliation for asthma  . Diabetes mellitus    vs impaired fasting glucose  . Family history of breast cancer   . Fibroids 2010  . Genital herpes   . GERD (gastroesophageal reflux disease)   . Hyperlipidemia   . Hypertension   . Impaired fasting blood sugar   . Migraines   . Obesity   . Right carpal tunnel syndrome   . Smoker     Family History  Problem Relation Age of Onset  . Diabetes Mother   . Hypertension Mother   . Aneurysm Mother   .  Stroke Mother   . Cancer Mother        cervical cancer  . Cancer Maternal Aunt        breast  . Cancer Cousin        breast/breast  . Lupus Cousin   . Heart disease Neg Hx   . Colon cancer Neg Hx   . Allergic rhinitis Neg Hx   . Angioedema Neg Hx   . Asthma Neg Hx   . Atopy Neg Hx   . Eczema Neg Hx   . Immunodeficiency Neg Hx   . Urticaria Neg Hx     Past Surgical History:  Procedure Laterality Date  . CARPAL TUNNEL RELEASE Right 08/30/2020   Procedure: RIGHT CARPAL TUNNEL RELEASE;  Surgeon: Leandrew Koyanagi, MD;  Location: [facility];  Service: Orthopedics;  Laterality: Right;  .  COLONOSCOPY  03/13/2016   Dr. Carlean Purl, normal, repeat 2027  . CYST EXCISION     left neck/postauricular region, benign  . LAPAROSCOPIC ABDOMINAL EXPLORATION     removal of ectopic preg  . LEFT HEART CATH AND CORONARY ANGIOGRAPHY N/A 06/23/2017   Procedure: LEFT HEART CATH AND CORONARY ANGIOGRAPHY;  Surgeon: Nelva Bush, MD;  Location: [facility];  Service: Cardiovascular;  Laterality: N/A;  . TUBAL LIGATION     Social History   Occupational History  . Not on file  Tobacco Use  . Smoking status: Current Every Day Smoker    Packs/day: 0.25    Years: 14.00    Pack years: 3.50    Types: Cigarettes  . Smokeless tobacco: Never Used  Vaping Use  . Vaping Use: Never used  Substance and Sexual Activity  . Alcohol use: Yes    Comment: occ  . Drug use: No  . Sexual activity: Yes    Birth control/protection: Surgical

## 2020-09-06 NOTE — Assessment & Plan Note (Signed)
Continue atorvastatin as prescribed

## 2020-09-08 MED FILL — ?ALLOPURINOL 100MG TABLET: 100 | 30 days supply | Qty: 30 | Fill #4

## 2020-09-08 MED FILL — AMLODIPINE BESYLATE 10 MG T: 10 | 30 days supply | Qty: 30 | Fill #1

## 2020-09-08 MED FILL — METFORMIN HCL 500 MG TABS: 500 | 30 days supply | Qty: 60 | Fill #1

## 2020-09-08 MED FILL — HYDROCHLOROTHIAZIDE 25 MG T: 25 | 30 days supply | Qty: 30 | Fill #4

## 2020-09-08 MED FILL — ?ATORVASTATIN 40MG TABLET: 40 | 30 days supply | Qty: 30 | Fill #2

## 2020-09-13 ENCOUNTER — Encounter: Payer: Self-pay | Admitting: Orthopaedic Surgery

## 2020-09-13 ENCOUNTER — Ambulatory Visit (INDEPENDENT_AMBULATORY_CARE_PROVIDER_SITE_OTHER): Payer: Self-pay | Admitting: Orthopaedic Surgery

## 2020-09-13 DIAGNOSIS — Z9889 Other specified postprocedural states: Secondary | ICD-10-CM

## 2020-09-13 NOTE — Progress Notes (Signed)
Post-Op Visit Note   Patient: Sydney Williams           Date of Birth: February 14, 1966           MRN: [id number] Visit Date: 09/13/2020 PCP: Sydney Stain, MD   Assessment & Plan:  Chief Complaint:  Chief Complaint  Patient presents with  . Right Hand - Routine Post Op, Pain   Visit Diagnoses:  1. S/P carpal tunnel release     Plan: [name] is 2 weeks status post right carpal tunnel release.  Doing well overall.  Symptoms from the carpal tunnel syndrome has completely resolved.  Sutures removed today.  Mupirocin ointment and Band-Aid applied.  She will continue this twice a day until the skin is completely healed.  Activity restrictions reviewed again.  Recheck in 4 weeks for final visit.  Follow-Up Instructions: Return in about 4 weeks (around 10/11/2020).   Orders:  No orders of the defined types were placed in this encounter.  No orders of the defined types were placed in this encounter.   Imaging: No results found.  PMFS History: Patient Active Problem List   Diagnosis Date Noted  . Carpal tunnel syndrome on right   . Seronegative rheumatoid arthritis ([place]) 08/04/2020  . Allergic conjunctivitis of both eyes 07/19/2020  . Lumbar radiculopathy, chronic 04/03/2020  . Elevated blood uric acid level 04/03/2020  . Family history of cervical cancer 02/02/2018  . Family history of breast cancer 02/02/2018  . Vitamin D deficiency 02/02/2018  . Depression 06/19/2017  . Hypomagnesemia 06/19/2017  . 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 Snoring 09/11/2016  . History of migraine 09/11/2016  . Frequent headaches 09/11/2016  . Moderate persistent asthma 06/01/2015  . Gastroesophageal reflux disease without esophagitis 06/01/2015  . Other allergic rhinitis 06/01/2015  . Tobacco user 11/30/2014  . Essential hypertension 11/30/2014  . Hyperlipidemia 11/30/2014  . Not well controlled asthma without complication 35/45/6256  . Obesity 07/02/2012  . Type 2  diabetes mellitus without complication, without long-term current use of insulin ([place]) 07/02/2012   Past Medical History:  Diagnosis Date  . Asthma    2015 hospitaliation for asthma  . Diabetes mellitus    vs impaired fasting glucose  . Family history of breast cancer   . Fibroids 2010  . Genital herpes   . Genital herpes 11/30/2014  . GERD (gastroesophageal reflux disease)   . Hyperlipidemia   . Hypertension   . Impaired fasting blood sugar   . Migraines   . Obesity   . Right carpal tunnel syndrome   . Smoker     Family History  Problem Relation Age of Onset  . Diabetes Mother   . Hypertension Mother   . Aneurysm Mother   . Stroke Mother   . Cancer Mother        cervical cancer  . Cancer Maternal Aunt        breast  . Cancer Cousin        breast/breast  . Lupus Cousin   . Heart disease Neg Hx   . Colon cancer Neg Hx   . Allergic rhinitis Neg Hx   . Angioedema Neg Hx   . Asthma Neg Hx   . Atopy Neg Hx   . Eczema Neg Hx   . Immunodeficiency Neg Hx   . Urticaria Neg Hx     Past Surgical History:  Procedure Laterality Date  . CARPAL TUNNEL RELEASE Right 08/30/2020   Procedure: RIGHT CARPAL TUNNEL RELEASE;  Surgeon: Leandrew Koyanagi, MD;  Location: [facility];  Service: Orthopedics;  Laterality: Right;  . COLONOSCOPY  03/13/2016   Dr. Carlean Purl, normal, repeat 2027  . CYST EXCISION     left neck/postauricular region, benign  . LAPAROSCOPIC ABDOMINAL EXPLORATION     removal of ectopic preg  . LEFT HEART CATH AND CORONARY ANGIOGRAPHY N/A 06/23/2017   Procedure: LEFT HEART CATH AND CORONARY ANGIOGRAPHY;  Surgeon: Nelva Bush, MD;  Location: [facility];  Service: Cardiovascular;  Laterality: N/A;  . TUBAL LIGATION     Social History   Occupational History  . Not on file  Tobacco Use  . Smoking status: Current Every Day Smoker    Packs/day: 0.25    Years: 14.00    Pack years: 3.50    Types: Cigarettes  . Smokeless tobacco: Never Used    Vaping Use  . Vaping Use: Never used  Substance and Sexual Activity  . Alcohol use: Yes    Comment: occ  . Drug use: No  . Sexual activity: Yes    Birth control/protection: Surgical

## 2020-09-18 ENCOUNTER — Encounter: Payer: Self-pay | Admitting: Allergy

## 2020-09-18 ENCOUNTER — Ambulatory Visit (INDEPENDENT_AMBULATORY_CARE_PROVIDER_SITE_OTHER): Payer: No Typology Code available for payment source | Admitting: Allergy

## 2020-09-18 ENCOUNTER — Other Ambulatory Visit: Payer: Self-pay

## 2020-09-18 VITALS — BP 130/70 | HR 85 | Temp 97.7°F | Resp 18 | Ht 69.5 in | Wt 287.2 lb

## 2020-09-18 DIAGNOSIS — J3089 Other allergic rhinitis: Secondary | ICD-10-CM

## 2020-09-18 DIAGNOSIS — J302 Other seasonal allergic rhinitis: Secondary | ICD-10-CM

## 2020-09-18 DIAGNOSIS — J4540 Moderate persistent asthma, uncomplicated: Secondary | ICD-10-CM

## 2020-09-18 DIAGNOSIS — Z720 Tobacco use: Secondary | ICD-10-CM

## 2020-09-18 DIAGNOSIS — H1010 Acute atopic conjunctivitis, unspecified eye: Secondary | ICD-10-CM | POA: Insufficient documentation

## 2020-09-18 NOTE — Patient Instructions (Addendum)
Asthma/COPD:  . Keep working on quitting smoking.  . Daily controller medication(s): continue Trelegy 275mcg 1 puff once a day. . May use albuterol rescue inhaler 2 puffs or nebulizer every 4 to 6 hours as needed for shortness of breath, chest tightness, coughing, and wheezing. May use albuterol rescue inhaler 2 puffs 5 to 15 minutes prior to strenuous physical activities. Monitor frequency of use.  . Asthma control goals:  o Full participation in all desired activities (may need albuterol before activity) o Albuterol use two times or less a week on average (not counting use with activity) o Cough interfering with sleep two times or less a month o Oral steroids no more than once a year o No hospitalizations  Environmental allergies  Past skin testing showed: Positive to grass, weed, ragweed, trees, dust mites. Borderline to feathers.   Continue environmental control measures as below.  May use over the counter antihistamines such as Zyrtec (cetirizine), Claritin (loratadine), Allegra (fexofenadine), or Xyzal (levocetirizine) daily as needed. May take twice a day during flares if needed.   Follow up in 4 months or sooner if needed.  Follow up with your dentist regarding the lump.   Reducing Pollen Exposure . Pollen seasons: trees (spring), grass (summer) and ragweed/weeds (fall). Marland Kitchen Keep windows closed in your home and car to lower pollen exposure.  Susa Simmonds air conditioning in the bedroom and throughout the house if possible.  . Avoid going out in dry windy days - especially early morning. . Pollen counts are highest between 5 - 10 AM and on dry, hot and windy days.  . Save outside activities for late afternoon or after a heavy rain, when pollen levels are lower.  . Avoid mowing of grass if you have grass pollen allergy. Marland Kitchen Be aware that pollen can also be transported indoors on people and pets.  . Dry your clothes in an automatic dryer rather than hanging them outside where they might  collect pollen.  . Rinse hair and eyes before bedtime. Control of House Dust Mite Allergen . Dust mite allergens are a common trigger of allergy and asthma symptoms. While they can be found throughout the house, these microscopic creatures thrive in warm, humid environments such as bedding, upholstered furniture and carpeting. . Because so much time is spent in the bedroom, it is essential to reduce mite levels there.  . Encase pillows, mattresses, and box springs in special allergen-proof fabric covers or airtight, zippered plastic covers.  . Bedding should be washed weekly in hot water (130 F) and dried in a hot dryer. Allergen-proof covers are available for comforters and pillows that can't be regularly washed.  Wendee Copp the allergy-proof covers every few months. Minimize clutter in the bedroom. Keep pets out of the bedroom.  Marland Kitchen Keep humidity less than 50% by using a dehumidifier or air conditioning. You can buy a humidity measuring device called a hygrometer to monitor this.  . If possible, replace carpets with hardwood, linoleum, or washable area rugs. If that's not possible, vacuum frequently with a vacuum that has a HEPA filter. . Remove all upholstered furniture and non-washable window drapes from the bedroom. . Remove all non-washable stuffed toys from the bedroom.  Wash stuffed toys weekly.

## 2020-09-18 NOTE — Assessment & Plan Note (Signed)
Past history - Perennial rhinoconjunctivitis symptoms for 30 years with worsening in the spring. 2021 skin testing showed: Positive to grass, weed, ragweed, trees, dust mites. Borderline to feathers.  Interim history - asymptomatic with no medications since there is no pollen outdoors.  Singulair caused weird dreams and drowsiness.  Continue environmental control measures as below.  May use over the counter antihistamines such as Zyrtec (cetirizine), Claritin (loratadine), Allegra (fexofenadine), or Xyzal (levocetirizine) daily as needed. May take twice a day during flares if needed.

## 2020-09-18 NOTE — Assessment & Plan Note (Signed)
Past history - Diagnosed with asthma 8 years ago but symptoms worse the last year.  Currently on Advair 250 mcg 1 puff twice a day and albuterol 3 times a day with some benefit.  Main triggers include allergies, infections and weather changes.  3 courses of prednisone within the past year. Patient is a current smoker.  Takes omeprazole for reflux. 2021 spirometry consistent with mixed restriction and obstruction with no improvement in FEV1 post bronchodilator treatment. Clinically feeling improved and wheezing resolved.  Interim history - Doing better with Trelegy. Trying to smoke less. Still using albuterol once a day for wheezing related exertion with good benefit.  . Encouraged smoking cessation. . Today's spirometry showed some restriction - unchanged from previous. ACT score 23. . Daily controller medication(s): continue Trelegy 234mcg 1 puff once a day. . May use albuterol rescue inhaler 2 puffs or nebulizer every 4 to 6 hours as needed for shortness of breath, chest tightness, coughing, and wheezing. May use albuterol rescue inhaler 2 puffs 5 to 15 minutes prior to strenuous physical activities. Monitor frequency of use.   Repeat spirometry at next visit. . If no improvement in symptoms will consider adding biologics treatment next.

## 2020-09-18 NOTE — Progress Notes (Signed)
Follow Up Note  RE: Sydney Williams MRN: [id number] DOB: Oct 20, 1966 Date of Office Visit: 09/18/2020  Referring provider: Elsie Stain, MD Primary care provider: Elsie Stain, MD  Chief Complaint: Follow-up  History of Present Illness: I had the pleasure of seeing Sydney Williams for a follow up visit at the [facility] [place] [facility] [place] on 09/18/2020. She is a 54 y.o. female, who is being followed for asthma and allergic rhinoconjunctivitis. Her previous allergy office visit was on 07/19/2020 with Dr. Maudie Mercury. Today is a regular follow up visit.  Asthma: Currently on Trelegy 222mcg 1 puff once a day and thinks it is working better than Advair. Using albuterol about once a day due to wheezing with exertion. Denies any ER/urgent care visits or prednisone use since the last visit. Smoking 2-3 cigarettes per day.  Singulair caused some weird dreams and drowsiness.  Patient stopped medication.  Allergic rhino conjunctivitis Not taking any medications right now and asymptomatic since there is no pollen outside.  Patient concerned about a lump on her left jawline. No pain with palpation. She did get some dental work done and has another appointment coming up later this month.   Assessment and Plan: Sydney Williams is a 54 y.o. female with: Moderate persistent asthma Past history - Diagnosed with asthma 8 years ago but symptoms worse the last year.  Currently on Advair 250 mcg 1 puff twice a day and albuterol 3 times a day with some benefit.  Main triggers include allergies, infections and weather changes.  3 courses of prednisone within the past year. Patient is a current smoker.  Takes omeprazole for reflux. 2021 spirometry consistent with mixed restriction and obstruction with no improvement in FEV1 post bronchodilator treatment. Clinically feeling improved and wheezing resolved.  Interim history - Doing better with Trelegy. Trying to smoke less. Still using albuterol once a day for wheezing  related exertion with good benefit.  . Encouraged smoking cessation. . Today's spirometry showed some restriction - unchanged from previous. ACT score 23. . Daily controller medication(s): continue Trelegy 249mcg 1 puff once a day. . May use albuterol rescue inhaler 2 puffs or nebulizer every 4 to 6 hours as needed for shortness of breath, chest tightness, coughing, and wheezing. May use albuterol rescue inhaler 2 puffs 5 to 15 minutes prior to strenuous physical activities. Monitor frequency of use.   Repeat spirometry at next visit. . If no improvement in symptoms will consider adding biologics treatment next.  Seasonal and perennial allergic rhinoconjunctivitis Past history - Perennial rhinoconjunctivitis symptoms for 30 years with worsening in the spring. 2021 skin testing showed: Positive to grass, weed, ragweed, trees, dust mites. Borderline to feathers.  Interim history - asymptomatic with no medications since there is no pollen outdoors.  Singulair caused weird dreams and drowsiness.  Continue environmental control measures as below.  May use over the counter antihistamines such as Zyrtec (cetirizine), Claritin (loratadine), Allegra (fexofenadine), or Xyzal (levocetirizine) daily as needed. May take twice a day during flares if needed.   Return in about 4 months (around 01/16/2021).   Follow up with your dentist regarding the left jawline swelling/lump. Not appreciable on today's exam.   Diagnostics: Spirometry:  Tracings reviewed. Her effort: Good reproducible efforts. FVC: 2.31L FEV1: 1.81L, 65% predicted FEV1/FVC ratio: 78% Interpretation: Spirometry consistent with possible restrictive disease.  Please see scanned spirometry results for details.  Medication List:  Current Outpatient Medications  Medication Sig Dispense Refill  . acetaminophen (TYLENOL) 500 MG tablet Take  1,000 mg by mouth every 6 (six) hours as needed.    Marland Kitchen acyclovir (ZOVIRAX) 200 MG capsule Take 1  capsule (200 mg total) by mouth 2 (two) times daily. 180 capsule 3  . albuterol (ACCUNEB) 0.63 MG/3ML nebulizer solution Take 1 ampule by nebulization every 6 (six) hours as needed for wheezing.    Marland Kitchen albuterol (VENTOLIN HFA) 108 (90 Base) MCG/ACT inhaler INHALE 1-2 PUFFS INTO THE LUNGS EVERY 6 (SIX) HOURS AS NEEDED FOR WHEEZING OR SHORTNESS OF BREATH. 18 g 0  . amLODipine (NORVASC) 10 MG tablet Take 1 tablet (10 mg total) by mouth daily. To lower blood pressure 90 tablet 0  . aspirin (EQ ASPIRIN ADULT LOW DOSE) 81 MG EC tablet Take 1 tablet (81 mg total) by mouth daily. Swallow whole. 90 tablet 3  . atorvastatin (LIPITOR) 40 MG tablet Take 1 tablet (40 mg total) by mouth daily. 30 tablet 2  . baclofen (LIORESAL) 10 MG tablet TAKE 1-2 TABLETS (10-20 MG TOTAL) BY MOUTH 3 (THREE) TIMES DAILY AS NEEDED FOR MUSCLE SPASMS. 90 tablet 1  . diphenhydrAMINE (BENADRYL) 25 MG tablet Take 1 tablet (25 mg total) by mouth every 6 (six) hours as needed. 30 tablet 0  . furosemide (LASIX) 20 MG tablet One pill once per day if needed for swelling 20 tablet 0  . gabapentin (NEURONTIN) 100 MG capsule 1 in the morning, 1 at night 180 capsule 3  . hydrochlorothiazide (HYDRODIURIL) 25 MG tablet Take 1 tablet (25 mg total) by mouth daily. To lower blood pressure 90 tablet 3  . ibuprofen (ADVIL) 600 MG tablet TAKE 1 TABLET (600 MG TOTAL) BY MOUTH EVERY 8 (EIGHT) HOURS AS NEEDED FOR MODERATE PAIN. TAKE AFTER EATING 90 tablet 1  . metFORMIN (GLUCOPHAGE) 500 MG tablet Take 1 tablet (500 mg total) by mouth 2 (two) times daily with a meal. 90 tablet 1  . omeprazole (PRILOSEC) 40 MG capsule Take 1 capsule (40 mg total) by mouth 2 (two) times daily. To decrease stomach acid 60 capsule 3  . Vitamin D, Cholecalciferol, 25 MCG (1000 UT) TABS Take 1 tablet by mouth once daily 90 tablet 0  . Fluticasone-Umeclidin-Vilant (TRELEGY ELLIPTA) 200-62.5-25 MCG/INH AEPB Inhale 1 puff into the lungs daily. Rinse mouth after each use. (Patient not  taking: Reported on 09/18/2020) 28 each 2  . metoprolol tartrate (LOPRESSOR) 50 MG tablet Take 1 tablet (50 mg total) by mouth 2 (two) times daily. (Patient not taking: Reported on 09/18/2020) 60 tablet 2  . oxyCODONE-acetaminophen (PERCOCET) 5-325 MG tablet Take 1 tablet by mouth 2 (two) times daily as needed. (Patient not taking: Reported on 09/18/2020) 20 tablet 0   No current facility-administered medications for this visit.   Allergies: Allergies  Allergen Reactions  . Lisinopril Swelling  . Potassium Chloride Shortness Of Breath and Swelling    Tolerates IV KCl, reaction only to PO product  . Pneumococcal Vaccines Swelling and Other (See Comments)    Reaction:  Swelling at injection site  . Vicodin [Hydrocodone-Acetaminophen] Itching and Rash   I reviewed her past medical history, social history, family history, and environmental history and no significant changes have been reported from her previous visit - carpal tunnel surgery.   Review of Systems  Constitutional: Negative for appetite change, chills, fever and unexpected weight change.  HENT: Negative for congestion and rhinorrhea.   Eyes: Negative for itching.  Respiratory: Positive for wheezing. Negative for cough, chest tightness and shortness of breath.   Cardiovascular: Negative for chest pain.  Gastrointestinal: Negative for abdominal pain.  Genitourinary: Negative for difficulty urinating.  Skin: Negative for rash.  Allergic/Immunologic: Positive for environmental allergies. Negative for food allergies.  Neurological: Negative for headaches.   Objective: BP 130/70 (BP Location: Left Arm, Patient Position: Sitting, Cuff Size: Normal)   Pulse 85   Temp 97.7 F (36.5 C) (Temporal)   Resp 18   Ht 5' 9.5" (1.765 m)   Wt 287 lb 3.2 oz (130.3 kg)   LMP 06/29/2015 (Exact Date)   SpO2 97%   BMI 41.80 kg/m  Body mass index is 41.8 kg/m. Physical Exam Vitals and nursing note reviewed.  Constitutional:       Appearance: Normal appearance. She is well-developed.  HENT:     Head: Normocephalic and atraumatic.     Right Ear: Tympanic membrane and external ear normal.     Left Ear: Tympanic membrane and external ear normal.     Nose: Nose normal.     Mouth/Throat:     Mouth: Mucous membranes are moist.     Pharynx: Oropharynx is clear.  Eyes:     Conjunctiva/sclera: Conjunctivae normal.  Cardiovascular:     Rate and Rhythm: Normal rate and regular rhythm.     Heart sounds: Normal heart sounds. No murmur heard.  No friction rub. No gallop.   Pulmonary:     Effort: Pulmonary effort is normal.     Breath sounds: Normal breath sounds. No wheezing, rhonchi or rales.  Musculoskeletal:     Cervical back: Neck supple.  Skin:    General: Skin is warm.     Findings: No rash.  Neurological:     Mental Status: She is alert and oriented to person, place, and time.  Psychiatric:        Behavior: Behavior normal.    Previous notes and tests were reviewed. The plan was reviewed with the patient/family, and all questions/concerned were addressed.  It was my pleasure to see Sydney Williams today and participate in her care. Please feel free to contact me with any questions or concerns.  Sincerely,  Rexene Alberts, DO Allergy & Immunology  [facility] office: [place] office: [phone]

## 2020-09-19 ENCOUNTER — Other Ambulatory Visit: Payer: Self-pay | Admitting: Family Medicine

## 2020-09-19 ENCOUNTER — Other Ambulatory Visit: Payer: Self-pay | Admitting: Critical Care Medicine

## 2020-09-19 DIAGNOSIS — K219 Gastro-esophageal reflux disease without esophagitis: Secondary | ICD-10-CM

## 2020-09-19 DIAGNOSIS — Z791 Long term (current) use of non-steroidal anti-inflammatories (NSAID): Secondary | ICD-10-CM

## 2020-09-19 MED FILL — OMEPRAZOLE DR 40 MG CAPSULE: 40 | 30 days supply | Qty: 60 | Fill #0

## 2020-09-26 ENCOUNTER — Ambulatory Visit: Payer: Medicaid Other | Admitting: Internal Medicine

## 2020-09-26 MED FILL — ?METOPROLOL TART 50 MG TABL: 50 | 30 days supply | Qty: 60 | Fill #2

## 2020-09-26 MED FILL — ?ACYCLOVIR 200 MG CAPSU: 200 | 30 days supply | Qty: 60 | Fill #0

## 2020-09-26 MED FILL — $TRELEGY 200-62.5-25: 200-62.5-25 | 30 days supply | Qty: 60 | Fill #2

## 2020-09-26 NOTE — Progress Notes (Deleted)
Office Visit Note  Patient: Sydney Williams             Date of Birth: 07/20/1966           MRN: [id number]             PCP: Elsie Stain, MD Referring: Elsie Stain, MD Visit Date: 09/26/2020   Subjective:  No chief complaint on file.   History of Present Illness: Sydney Williams is a 54 y.o. female here for follow up of seronegative rheumatoid arthritis now s/p recent CTS release surgery.***     No Rheumatology ROS completed.   PMFS History:  Patient Active Problem List   Diagnosis Date Noted  . Seasonal and perennial allergic rhinoconjunctivitis 09/18/2020  . Carpal tunnel syndrome on right   . Seronegative rheumatoid arthritis ([place]) 08/04/2020  . Allergic conjunctivitis of both eyes 07/19/2020  . Lumbar radiculopathy, chronic 04/03/2020  . Elevated blood uric acid level 04/03/2020  . Family history of cervical cancer 02/02/2018  . Family history of breast cancer 02/02/2018  . Vitamin D deficiency 02/02/2018  . Depression 06/19/2017  . Hypomagnesemia 06/19/2017  . 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 Snoring 09/11/2016  . History of migraine 09/11/2016  . Frequent headaches 09/11/2016  . Moderate persistent asthma 06/01/2015  . Gastroesophageal reflux disease without esophagitis 06/01/2015  . Other allergic rhinitis 06/01/2015  . Tobacco user 11/30/2014  . Essential hypertension 11/30/2014  . Hyperlipidemia 11/30/2014  . Obesity 07/02/2012  . Type 2 diabetes mellitus without complication, without long-term current use of insulin ([place]) 07/02/2012    Past Medical History:  Diagnosis Date  . Asthma    2015 hospitaliation for asthma  . Diabetes mellitus    vs impaired fasting glucose  . Family history of breast cancer   . Fibroids 2010  . Genital herpes   . Genital herpes 11/30/2014  . GERD (gastroesophageal reflux disease)   . Hyperlipidemia   . Hypertension   . Impaired fasting blood sugar   . Migraines   . Obesity   . Right  carpal tunnel syndrome   . Smoker     Family History  Problem Relation Age of Onset  . Diabetes Mother   . Hypertension Mother   . Aneurysm Mother   . Stroke Mother   . Cancer Mother        cervical cancer  . Cancer Maternal Aunt        breast  . Cancer Cousin        breast/breast  . Lupus Cousin   . Heart disease Neg Hx   . Colon cancer Neg Hx   . Allergic rhinitis Neg Hx   . Angioedema Neg Hx   . Asthma Neg Hx   . Atopy Neg Hx   . Eczema Neg Hx   . Immunodeficiency Neg Hx   . Urticaria Neg Hx    Past Surgical History:  Procedure Laterality Date  . CARPAL TUNNEL RELEASE Right 08/30/2020   Procedure: RIGHT CARPAL TUNNEL RELEASE;  Surgeon: Leandrew Koyanagi, MD;  Location: [facility];  Service: Orthopedics;  Laterality: Right;  . COLONOSCOPY  03/13/2016   Dr. Carlean Purl, normal, repeat 2027  . CYST EXCISION     left neck/postauricular region, benign  . LAPAROSCOPIC ABDOMINAL EXPLORATION     removal of ectopic preg  . LEFT HEART CATH AND CORONARY ANGIOGRAPHY N/A 06/23/2017   Procedure: LEFT HEART CATH AND CORONARY ANGIOGRAPHY;  Surgeon: Nelva Bush, MD;  Location: [facility];  Service: Cardiovascular;  Laterality: N/A;  . TUBAL LIGATION     Social History   Social History Narrative   Married, and her granddaughter lives with her, works as Quarry manager at [employer], exercise - activity at work, walking.  12/2016   Immunization History  Administered Date(s) Administered  . Influenza,inj,Quad PF,6+ Mos 12/04/2015, 01/08/2017, 09/06/2020  . Influenza-Unspecified 08/28/2018  . PFIZER SARS-COV-2 Vaccination 01/27/2020, 02/21/2020  . PPD Test 07/27/2019  . Tdap 11/30/2014     Objective: Vital Signs: LMP 06/29/2015 (Exact Date)    Physical Exam   Musculoskeletal Exam: ***  CDAI Exam: CDAI Score: -- Patient Global: --; Provider Global: -- Swollen: --; Tender: -- Joint Exam 09/26/2020   No joint exam has been documented for this visit   There is  currently no information documented on the homunculus. Go to the Rheumatology activity and complete the homunculus joint exam.  Investigation: No additional findings.  Imaging: No results found.  Recent Labs: Lab Results  Component Value Date   WBC 7.9 08/04/2020   HGB 13.7 08/04/2020   PLT 302 08/04/2020   NA 141 08/28/2020   K 3.6 08/28/2020   CL 99 08/28/2020   CO2 28 08/28/2020   GLUCOSE 133 (H) 08/28/2020   BUN 6 08/28/2020   CREATININE 0.55 08/28/2020   BILITOT 0.3 08/04/2020   ALKPHOS 125 (H) 06/06/2020   AST 17 08/04/2020   ALT 19 08/04/2020   PROT 7.5 08/04/2020   ALBUMIN 4.1 06/06/2020   CALCIUM 9.4 08/28/2020   GFRAA 125 08/04/2020   QFTBGOLDPLUS NEGATIVE 08/04/2020    Speciality Comments: No specialty comments available.  Procedures:  No procedures performed Allergies: Lisinopril, Potassium chloride, Pneumococcal vaccines, and Vicodin [hydrocodone-acetaminophen]   Assessment / Plan:     Visit Diagnoses: No diagnosis found.  Orders: No orders of the defined types were placed in this encounter.  No orders of the defined types were placed in this encounter.   Face-to-face time spent with patient was *** minutes. Greater than 50% of time was spent in counseling and coordination of care.  Follow-Up Instructions: No follow-ups on file.   Collier Salina, MD  Note - This record has been created using [employer].  Chart creation errors have been sought, but may not always  have been located. Such creation errors do not reflect on  the standard of medical care.

## 2020-09-27 MED FILL — OMEPRAZOLE DR 40 MG CAPSULE: 40 | 30 days supply | Qty: 60 | Fill #0

## 2020-10-06 ENCOUNTER — Ambulatory Visit: Payer: Medicaid Other

## 2020-10-06 ENCOUNTER — Other Ambulatory Visit: Payer: Self-pay | Admitting: Critical Care Medicine

## 2020-10-06 ENCOUNTER — Other Ambulatory Visit: Payer: Self-pay

## 2020-10-06 ENCOUNTER — Encounter: Payer: Self-pay | Admitting: Specialist

## 2020-10-06 ENCOUNTER — Ambulatory Visit (INDEPENDENT_AMBULATORY_CARE_PROVIDER_SITE_OTHER): Payer: Self-pay | Admitting: Specialist

## 2020-10-06 ENCOUNTER — Ambulatory Visit (INDEPENDENT_AMBULATORY_CARE_PROVIDER_SITE_OTHER): Payer: Self-pay

## 2020-10-06 VITALS — BP 125/85 | HR 84 | Ht 69.0 in | Wt 279.0 lb

## 2020-10-06 DIAGNOSIS — R29898 Other symptoms and signs involving the musculoskeletal system: Secondary | ICD-10-CM

## 2020-10-06 DIAGNOSIS — M4726 Other spondylosis with radiculopathy, lumbar region: Secondary | ICD-10-CM

## 2020-10-06 DIAGNOSIS — M5416 Radiculopathy, lumbar region: Secondary | ICD-10-CM

## 2020-10-06 DIAGNOSIS — M5136 Other intervertebral disc degeneration, lumbar region: Secondary | ICD-10-CM

## 2020-10-06 MED FILL — ?BACLOFEN 10 MG TABLET: 10 | 15 days supply | Qty: 90 | Fill #1

## 2020-10-06 MED FILL — HYDROCHLOROTHIAZIDE 25 MG T: 25 | 30 days supply | Qty: 30 | Fill #5

## 2020-10-06 MED FILL — AMLODIPINE BESYLATE 10 MG T: 10 | 30 days supply | Qty: 30 | Fill #2

## 2020-10-06 MED FILL — ?ALLOPURINOL 100MG TABLET: 100 | 30 days supply | Qty: 30 | Fill #5

## 2020-10-06 MED FILL — ?ATORVASTATIN 40MG TABLET: 40 | 30 days supply | Qty: 30 | Fill #0

## 2020-10-06 NOTE — Patient Instructions (Addendum)
Plan:Avoid bending, stooping and avoid lifting weights greater than 10 lbs. Avoid prolong standing and walking. Order for a new walker with wheels. Surgery scheduling secretary Kandice Hams, will call you in the next week to schedule for surgery.  Surgery recommended is a three level lumbar fusion L2-3, L3-4 and L4-5 this would be done with rods, screws and cages with local bone graft and allograft (donor bone graft). Take tylenol, advil and baclofen and duloxetine for for pain. Risk of surgery includes risk of infection 1 in 100 patients, bleeding 1/2% chance you would need a transfusion. Risk to the nerves is one in 10,000. You will need to use a brace for 3 months and wean from the brace on the 4th month. Expect improved walking and standing tolerance. Expect relief of leg pain but numbness may persist depending on the length and degree of pressure that has been present. 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

## 2020-10-06 NOTE — Progress Notes (Signed)
Office Visit Note   Patient: Sydney Williams           Date of Birth: Dec 21, 1965           MRN: [id number] Visit Date: 10/06/2020              Requested by: Elsie Stain, MD [address],  [place] [address] PCP: Elsie Stain, MD   Assessment & Plan: Visit Diagnoses:  1. Lumbar back pain with radiculopathy affecting right lower extremity     Plan: Plan:Avoid bending, stooping and avoid lifting weights greater than 10 lbs. Avoid prolong standing and walking. Order for a new walker with wheels. Surgery scheduling secretary Kandice Hams, will call you in the next week to schedule for surgery.  Surgery recommended is a three level lumbar fusion L2-3, L3-4 and L4-5 this would be done with rods, screws and cages with local bone graft and allograft (donor bone graft). Take tylenol, advil and baclofen and duloxetine for for pain. Risk of surgery includes risk of infection 1 in 100 patients, bleeding 1/2% chance you would need a transfusion. Risk to the nerves is one in 10,000. You will need to use a brace for 3 months and wean from the brace on the 4th month. Expect improved walking and standing tolerance. Expect relief of leg pain but numbness may persist depending on the length and degree of pressure that has been present. 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   Orders:  Orders Placed This Encounter  Procedures  . XR Lumbar Spine 2-3 Views   No orders of the defined types were placed in this encounter.      Procedures: No procedures performed   Clinical Data: No additional findings.   Subjective: Chief Complaint  Patient presents with  . Lower Back - Follow-up    54 year old female with pain in the small of her back and radiates into both legs left leg greater than right. Pain is into the left leg all over with  Worsening pain with standing and walking. No bowel or bladder difficulty. She rides cart at the grocery store, can not walk a mile. She can not walk even a football field without sitting down. 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 She has not worked since 2018 due to back pain mainly. She has had surgical right CTR 5 weeks ago and is on SSDI and medicaid. She is experiencing pain that is a 9 of 10. She is taking ibuprofen and tylenol and more tylenol for pain as the ibuprofen will irritate her stomach. Given percocet but she did not like them caused hallucinations and vivid dreams. Sitting relieves the pain, going to standing the pain recurrs. The back pain is greater than the leg pain.   Review of Systems  Constitutional: Negative.   HENT: Negative.   Eyes: Negative.   Respiratory: Negative.   Cardiovascular: Negative.   Gastrointestinal: Negative.   Endocrine: Negative.   Genitourinary: Negative.   Musculoskeletal: Negative.   Skin:  Negative.   Allergic/Immunologic: Negative.   Neurological: Negative.   Hematological: Negative.   Psychiatric/Behavioral: Negative.      Objective: Vital Signs: BP 125/85 (BP Location: Left Arm, Patient Position: Sitting)   Pulse 84   Ht 5\' 9"  (1.753 m)   Wt 279 lb (126.6 kg)   LMP 06/29/2015 (Exact Date)   BMI 41.20 kg/m   Physical Exam Constitutional:      Appearance: She is well-developed and well-nourished.  HENT:     Head: Normocephalic and atraumatic.  Eyes:     Extraocular Movements: EOM normal.     Pupils: Pupils are equal, round, and reactive to light.  Pulmonary:     Effort: Pulmonary effort is normal.     Breath  sounds: Normal breath sounds.  Abdominal:     General: Bowel sounds are normal.     Palpations: Abdomen is soft.  Musculoskeletal:        General: Normal range of motion.     Cervical back: Normal range of motion and neck supple.  Skin:    General: Skin is warm and dry.  Neurological:     Mental Status: She is alert and oriented to person, place, and time.  Psychiatric:        Mood and Affect: Mood and affect normal.        Behavior: Behavior normal.        Thought Content: Thought content normal.        Judgment: Judgment normal.     Ortho Exam  Specialty Comments:  No specialty comments available.  Imaging: No results found.   PMFS History: Patient Active Problem List   Diagnosis Date Noted  . Seasonal and perennial allergic rhinoconjunctivitis 09/18/2020  . Carpal tunnel syndrome on right   . Seronegative rheumatoid arthritis ([place]) 08/04/2020  . Allergic conjunctivitis of both eyes 07/19/2020  . Lumbar radiculopathy, chronic 04/03/2020  . Elevated blood uric acid level 04/03/2020  . Family history of cervical cancer 02/02/2018  . Family history of breast cancer 02/02/2018  . Vitamin D deficiency 02/02/2018  . Depression 06/19/2017  . Hypomagnesemia 06/19/2017  . 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 Snoring 09/11/2016  . History of migraine 09/11/2016  . Frequent headaches 09/11/2016  . Moderate persistent asthma 06/01/2015  . Gastroesophageal reflux disease without esophagitis 06/01/2015  . Other allergic rhinitis 06/01/2015  . Tobacco user 11/30/2014  . Essential hypertension 11/30/2014  . Hyperlipidemia 11/30/2014  . Obesity 07/02/2012  . Type 2 diabetes mellitus without complication, without long-term current use of insulin ([place]) 07/02/2012   Past Medical History:  Diagnosis Date  . Asthma    2015 hospitaliation for asthma  . Diabetes mellitus    vs impaired fasting glucose  . Family history of breast cancer   . Fibroids 2010  .  Genital herpes   . Genital herpes 11/30/2014  . GERD (gastroesophageal reflux disease)   . Hyperlipidemia   . Hypertension   . Impaired fasting blood sugar   . Migraines   . Obesity   . Right carpal tunnel syndrome   . Smoker     Family History  Problem Relation Age of Onset  . Diabetes Mother   . Hypertension Mother   . Aneurysm Mother   . Stroke Mother   . Cancer Mother        cervical cancer  . Cancer Maternal Aunt        breast  . Cancer Cousin  breast/breast  . Lupus Cousin   . Heart disease Neg Hx   . Colon cancer Neg Hx   . Allergic rhinitis Neg Hx   . Angioedema Neg Hx   . Asthma Neg Hx   . Atopy Neg Hx   . Eczema Neg Hx   . Immunodeficiency Neg Hx   . Urticaria Neg Hx     Past Surgical History:  Procedure Laterality Date  . CARPAL TUNNEL RELEASE Right 08/30/2020   Procedure: RIGHT CARPAL TUNNEL RELEASE;  Surgeon: Leandrew Koyanagi, MD;  Location: [facility];  Service: Orthopedics;  Laterality: Right;  . COLONOSCOPY  03/13/2016   Dr. Carlean Purl, normal, repeat 2027  . CYST EXCISION     left neck/postauricular region, benign  . LAPAROSCOPIC ABDOMINAL EXPLORATION     removal of ectopic preg  . LEFT HEART CATH AND CORONARY ANGIOGRAPHY N/A 06/23/2017   Procedure: LEFT HEART CATH AND CORONARY ANGIOGRAPHY;  Surgeon: Nelva Bush, MD;  Location: [facility];  Service: Cardiovascular;  Laterality: N/A;  . TUBAL LIGATION     Social History   Occupational History  . Not on file  Tobacco Use  . Smoking status: Current Every Day Smoker    Packs/day: 0.25    Years: 14.00    Pack years: 3.50    Types: Cigarettes  . Smokeless tobacco: Never Used  Vaping Use  . Vaping Use: Never used  Substance and Sexual Activity  . Alcohol use: Yes    Comment: occ  . Drug use: No  . Sexual activity: Yes    Birth control/protection: Surgical

## 2020-10-09 ENCOUNTER — Other Ambulatory Visit: Payer: Self-pay

## 2020-10-10 ENCOUNTER — Other Ambulatory Visit: Payer: Self-pay

## 2020-10-10 ENCOUNTER — Ambulatory Visit (HOSPITAL_COMMUNITY)
Admission: RE | Admit: 2020-10-10 | Discharge: 2020-10-10 | Disposition: A | Discharge status: Home / Self Care | Payer: Self-pay | Source: Ambulatory Visit | Attending: Specialist | Admitting: Specialist

## 2020-10-10 DIAGNOSIS — M4726 Other spondylosis with radiculopathy, lumbar region: Secondary | ICD-10-CM

## 2020-10-10 DIAGNOSIS — M51369 Other intervertebral disc degeneration, lumbar region without mention of lumbar back pain or lower extremity pain: Secondary | ICD-10-CM

## 2020-10-10 DIAGNOSIS — M5136 Other intervertebral disc degeneration, lumbar region: Secondary | ICD-10-CM | POA: Insufficient documentation

## 2020-10-11 ENCOUNTER — Ambulatory Visit (INDEPENDENT_AMBULATORY_CARE_PROVIDER_SITE_OTHER): Payer: Medicaid Other | Admitting: Physician Assistant

## 2020-10-11 ENCOUNTER — Ambulatory Visit: Payer: Self-pay | Attending: Critical Care Medicine

## 2020-10-11 ENCOUNTER — Other Ambulatory Visit: Payer: Self-pay

## 2020-10-11 ENCOUNTER — Encounter: Payer: Self-pay | Admitting: Orthopaedic Surgery

## 2020-10-11 DIAGNOSIS — G5601 Carpal tunnel syndrome, right upper limb: Secondary | ICD-10-CM

## 2020-10-11 NOTE — Progress Notes (Signed)
Post-Op Visit Note   Patient: Sydney Williams           Date of Birth: 09-19-1966           MRN: [id number] Visit Date: 10/11/2020 PCP: Elsie Stain, MD   Assessment & Plan:  Chief Complaint:  Chief Complaint  Patient presents with  . Right Wrist - Routine Post Op, Follow-up   Visit Diagnoses:  1. Right carpal tunnel syndrome     Plan: Patient is a pleasant 54 year old female who comes in today 6 weeks out right carpal tunnel release.  She has been doing well.  She still notes some soreness around the incision.  She has been taking Tylenol for the pain.  She has still been applying the mupirocin and has recently started applying cocoa butter over the incision the past week.  Examination of her right wrist reveals a fully healed surgical scar without complication.  She is neurovascularly intact distally.  At this point, she will continue to advance with activity as tolerated.  She will follow up with [place] as needed.  Follow-Up Instructions: Return if symptoms worsen or fail to improve.   Orders:  No orders of the defined types were placed in this encounter.  No orders of the defined types were placed in this encounter.   Imaging: MR Lumbar Spine w/o contrast  Result Date: 10/10/2020 CLINICAL DATA:  Low back pain with progressive neurological deficit. EXAM: MRI LUMBAR SPINE WITHOUT CONTRAST TECHNIQUE: Multiplanar, multisequence MR imaging of the lumbar spine was performed. No intravenous contrast was administered. COMPARISON:  MRI of the lumbar spine October 20, 2019. FINDINGS: Segmentation:  Standard. Alignment:  Physiologic. Vertebrae: No fracture, evidence of discitis, or bone lesion. Endplate degenerative changes at L3-4 and L4-5. Conus medullaris and cauda equina: Conus extends to the L1 level. Conus and cauda equina appear normal. Paraspinal and other soft tissues: Negative. Disc levels: T12-L1: No spinal canal neural foraminal stenosis. L1-2: Moderate facet degenerative  changes. No significant spinal canal or neural foraminal stenosis. L2-3: Loss of disc height, disc bulge and moderate facet degenerative changes with ligamentum flavum redundancy and prominence of the posterior epidural fat resulting in mild spinal canal stenosis with narrowing of the bilateral subarticular zones. No significant neural foraminal narrowing. L3-4: Loss of disc height, disc bulge with superimposed right far lateral disc protrusion which contacts the exiting right L3 nerve root. Moderate facet degenerative changes and ligamentum flavum redundancy also contribute for narrowing of the bilateral subarticular zones, mild to moderate right and mild left neural foraminal narrowing. L4-5: Disc bulge with superimposed small central disc protrusion, moderate facet degenerative changes ligamentum flavum redundancy resulting in moderate spinal canal stenosis with narrowing the bilateral subarticular zones, and moderate bilateral neural foraminal narrowing. L5-S1: Disc bulge, moderate right and advanced left facet degenerative changes resulting in narrowing of the bilateral subarticular zones and moderate bilateral neural foraminal narrowing. No significant change from prior MRI. IMPRESSION: 1. No acute abnormality of the lumbar spine. 2. Multilevel degenerative changes of the lumbar spine, worst at L4-5 where there is moderate spinal canal stenosis, narrowing of the bilateral subarticular zones and moderate bilateral neural foraminal narrowing. 3. Moderate bilateral neural foraminal narrowing at L5-S1. 4. Right far lateral disc protrusion at L3-L4 which contacts the exiting right L3 nerve root. Electronically Signed   By: Pedro Earls M.D.   On: 10/10/2020 21:13    PMFS History: Patient Active Problem List   Diagnosis Date Noted  . Seasonal and  perennial allergic rhinoconjunctivitis 09/18/2020  . Carpal tunnel syndrome on right   . Seronegative rheumatoid arthritis ([place]) 08/04/2020  .  Allergic conjunctivitis of both eyes 07/19/2020  . Lumbar radiculopathy, chronic 04/03/2020  . Elevated blood uric acid level 04/03/2020  . Family history of cervical cancer 02/02/2018  . Family history of breast cancer 02/02/2018  . Vitamin D deficiency 02/02/2018  . Depression 06/19/2017  . Hypomagnesemia 06/19/2017  . 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 Snoring 09/11/2016  . History of migraine 09/11/2016  . Frequent headaches 09/11/2016  . Moderate persistent asthma 06/01/2015  . Gastroesophageal reflux disease without esophagitis 06/01/2015  . Other allergic rhinitis 06/01/2015  . Tobacco user 11/30/2014  . Essential hypertension 11/30/2014  . Hyperlipidemia 11/30/2014  . Obesity 07/02/2012  . Type 2 diabetes mellitus without complication, without long-term current use of insulin ([place]) 07/02/2012   Past Medical History:  Diagnosis Date  . Asthma    2015 hospitaliation for asthma  . Diabetes mellitus    vs impaired fasting glucose  . Family history of breast cancer   . Fibroids 2010  . Genital herpes   . Genital herpes 11/30/2014  . GERD (gastroesophageal reflux disease)   . Hyperlipidemia   . Hypertension   . Impaired fasting blood sugar   . Migraines   . Obesity   . Right carpal tunnel syndrome   . Smoker     Family History  Problem Relation Age of Onset  . Diabetes Mother   . Hypertension Mother   . Aneurysm Mother   . Stroke Mother   . Cancer Mother        cervical cancer  . Cancer Maternal Aunt        breast  . Cancer Cousin        breast/breast  . Lupus Cousin   . Heart disease Neg Hx   . Colon cancer Neg Hx   . Allergic rhinitis Neg Hx   . Angioedema Neg Hx   . Asthma Neg Hx   . Atopy Neg Hx   . Eczema Neg Hx   . Immunodeficiency Neg Hx   . Urticaria Neg Hx     Past Surgical History:  Procedure Laterality Date  . CARPAL TUNNEL RELEASE Right 08/30/2020   Procedure: RIGHT CARPAL TUNNEL RELEASE;  Surgeon: Leandrew Koyanagi, MD;   Location: [facility];  Service: Orthopedics;  Laterality: Right;  . COLONOSCOPY  03/13/2016   Dr. Carlean Purl, normal, repeat 2027  . CYST EXCISION     left neck/postauricular region, benign  . LAPAROSCOPIC ABDOMINAL EXPLORATION     removal of ectopic preg  . LEFT HEART CATH AND CORONARY ANGIOGRAPHY N/A 06/23/2017   Procedure: LEFT HEART CATH AND CORONARY ANGIOGRAPHY;  Surgeon: Nelva Bush, MD;  Location: [facility];  Service: Cardiovascular;  Laterality: N/A;  . TUBAL LIGATION     Social History   Occupational History  . Not on file  Tobacco Use  . Smoking status: Current Every Day Smoker    Packs/day: 0.25    Years: 14.00    Pack years: 3.50    Types: Cigarettes  . Smokeless tobacco: Never Used  Vaping Use  . Vaping Use: Never used  Substance and Sexual Activity  . Alcohol use: Yes    Comment: occ  . Drug use: No  . Sexual activity: Yes    Birth control/protection: Surgical

## 2020-10-12 ENCOUNTER — Ambulatory Visit (INDEPENDENT_AMBULATORY_CARE_PROVIDER_SITE_OTHER): Payer: Self-pay | Admitting: Surgery

## 2020-10-12 ENCOUNTER — Encounter: Payer: Self-pay | Admitting: Surgery

## 2020-10-12 VITALS — BP 132/88 | HR 83 | Temp 98.7°F

## 2020-10-12 DIAGNOSIS — M4726 Other spondylosis with radiculopathy, lumbar region: Secondary | ICD-10-CM

## 2020-10-12 DIAGNOSIS — M5416 Radiculopathy, lumbar region: Secondary | ICD-10-CM

## 2020-10-12 NOTE — Progress Notes (Signed)
54 year old black female history of L2-L5 stenosis/HNP comes in for preop evaluation.  She continues have ongoing low back pain and left lower extremity radiculopathy.  Symptoms unchanged from previous visit.  She is wanting to proceed with LEFT L2-3, L3-4 AND L4-5 TRANSFORAMINAL LUMBAR INTERBODY FUSIONS WITH PEDICLE SCREWS, RODS, CAGES X3, LOCAL BONE GRAFT, ALLOGRAFT BONE GRAFT AND VIVIGEN as scheduled. We received preop medical and cardiac clearance.  Today history physical performed.  Review of systems negative.  Surgical procedure discussed along with potential rehab/recovery time.  All questions answered.  Patient states that she has some concern about postop pain management.  Percocet makes her have "weird dreams" and Vicodin has caused a rash in the past.  She states that she usually usually does okay with Tylenol with codeine.  Advised patient that she may need something stronger after this procedure and we may consider trying Nucynta.

## 2020-10-16 ENCOUNTER — Other Ambulatory Visit: Payer: Self-pay | Admitting: Critical Care Medicine

## 2020-10-16 ENCOUNTER — Other Ambulatory Visit: Payer: Self-pay | Admitting: Pharmacist

## 2020-10-16 DIAGNOSIS — M5416 Radiculopathy, lumbar region: Secondary | ICD-10-CM

## 2020-10-16 MED ORDER — IBUPROFEN 600 MG PO TABS: 600.0000 mg | ORAL_TABLET | Freq: Three times a day (TID) | ORAL | 1 refills | Status: DC | PRN

## 2020-10-16 MED FILL — IBUPROFEN 600 MG TABLET: 600 | 30 days supply | Qty: 90 | Fill #0

## 2020-10-16 MED FILL — GABAPENTIN 100 MG CAPSULE: 100 | 30 days supply | Qty: 180 | Fill #3

## 2020-10-17 MED FILL — OMEPRAZOLE DR 40 MG CAPSULE: 40 | 30 days supply | Qty: 60 | Fill #0

## 2020-10-17 NOTE — Progress Notes (Signed)
[place], [address] [place] [address] Phone: [phone] Fax: [phone]  [place], [place] [address] [place] [address] Phone: [phone] Fax: [phone]      Your procedure is scheduled on Tuesday, December 28th.  Report to [facility] Main Entrance "A" at 5:30 A.M., and check in at the Admitting office.  Call this number if you have problems the morning of surgery:  [phone]  Call [phone] if you have any questions prior to your surgery date Monday-Friday 8am-4pm    Remember:  Do not eat after midnight the night before your surgery  You may drink clear liquids until 4:30 AM the morning of your surgery.   Clear liquids allowed are: Water, Non-Citrus Juices (without pulp), Carbonated Beverages, Clear Tea, Black Coffee Only, and Gatorade    Take these medicines the morning of surgery with A SIP OF WATER   Tylenol - if needed  Acyclovir (Zovirax)  Albuterol inhaler - if needed (bring with you on the day of surgery)  Allopurinol (Zyloprim)  Amlodipine (Norvasc)  Atorvastatin (Lipitor)  Benadryl - if needed  Trelegy Ellipta Inhaler - if needed  Gabapentin (Neurontin)  Metoprolol (Lopressor)  Omeprazole (Prilosec)  Follow your surgeon's instructions on when to stop Aspirin.  If no instructions were given by your surgeon then you will need to call the office to get those instructions.    As of today, STOP taking any Aspirin (unless otherwise instructed by your surgeon) Aleve, Naproxen, Ibuprofen, Motrin, Advil, Goody's, BC's, all herbal medications, fish oil, and all vitamins.   WHAT DO I DO ABOUT MY DIABETES MEDICATION?   Marland Kitchen Do not take oral diabetes medicines (pills) the morning of surgery. - Metformin   HOW TO MANAGE YOUR DIABETES BEFORE AND AFTER SURGERY  Why is it important to control my blood sugar before and  after surgery? . Improving blood sugar levels before and after surgery helps healing and can limit problems. . A way of improving blood sugar control is eating a healthy diet by: o  Eating less sugar and carbohydrates o  Increasing activity/exercise o  Talking with your doctor about reaching your blood sugar goals . High blood sugars (greater than 180 mg/dL) can raise your risk of infections and slow your recovery, so you will need to focus on controlling your diabetes during the weeks before surgery. . Make sure that the doctor who takes care of your diabetes knows about your planned surgery including the date and location.  How do I manage my blood sugar before surgery? . Check your blood sugar at least 4 times a day, starting 2 days before surgery, to make sure that the level is not too high or low. . Check your blood sugar the morning of your surgery when you wake up and every 2 hours until you get to the Short Stay unit. o If your blood sugar is less than 70 mg/dL, you will need to treat for low blood sugar: - Do not take insulin. - Treat a low blood sugar (less than 70 mg/dL) with  cup of clear juice (cranberry or apple), 4 glucose tablets, OR glucose gel. - Recheck blood sugar in 15 minutes after treatment (to make sure it is greater than 70 mg/dL). If your blood sugar is not greater than 70 mg/dL on recheck, call [phone] for further instructions. . Report your blood sugar to the  short stay nurse when you get to Short Stay.  . If you are admitted to the hospital after surgery: o Your blood sugar will be checked by the staff and you will probably be given insulin after surgery (instead of oral diabetes medicines) to make sure you have good blood sugar levels. o The goal for blood sugar control after surgery is 80-180 mg/dL.               DAY OF SURGERY:                   Do not wear jewelry, make up, or nail polish            Do not wear lotions, powders, perfumes, or  deodorant.            Do not shave 48 hours prior to surgery.              Do not bring valuables to the hospital.            [facility] is not responsible for any belongings or valuables.  Do NOT Smoke (Tobacco/Vaping) or drink Alcohol 24 hours prior to your procedure If you use a CPAP at night, you may bring all equipment for your overnight stay.   Contacts, glasses, dentures or bridgework may not be worn into surgery.      For patients admitted to the hospital, discharge time will be determined by your treatment team.   Patients discharged the day of surgery will not be allowed to drive home, and someone needs to stay with them for 24 hours.    Special instructions:   [facility]- Preparing For Surgery  Before surgery, you can play an important role. Because skin is not sterile, your skin needs to be as free of germs as possible. You can reduce the number of germs on your skin by washing with CHG (chlorahexidine gluconate) Soap before surgery.  CHG is an antiseptic cleaner which kills germs and bonds with the skin to continue killing germs even after washing.    Oral Hygiene is also important to reduce your risk of infection.  Remember - BRUSH YOUR TEETH THE MORNING OF SURGERY WITH YOUR REGULAR TOOTHPASTE  Please do not use if you have an allergy to CHG or antibacterial soaps. If your skin becomes reddened/irritated stop using the CHG.  Do not shave (including legs and underarms) for at least 48 hours prior to first CHG shower. It is OK to shave your face.  Please follow these instructions carefully.   1. Shower the NIGHT BEFORE SURGERY and the MORNING OF SURGERY with CHG Soap.   2. If you chose to wash your hair, wash your hair first as usual with your normal shampoo.  3. After you shampoo, rinse your hair and body thoroughly to remove the shampoo.  4. Use CHG as you would any other liquid soap. You can apply CHG directly to the skin and wash gently with a scrungie or a clean  washcloth.   5. Apply the CHG Soap to your body ONLY FROM THE NECK DOWN.  Do not use on open wounds or open sores. Avoid contact with your eyes, ears, mouth and genitals (private parts). Wash Face and genitals (private parts)  with your normal soap.   6. Wash thoroughly, paying special attention to the area where your surgery will be performed.  7. Thoroughly rinse your body with warm water from the neck down.  8. DO NOT shower/wash  with your normal soap after using and rinsing off the CHG Soap.  9. Pat yourself dry with a CLEAN TOWEL.  10. Wear CLEAN PAJAMAS to bed the night before surgery  11. Place CLEAN SHEETS on your bed the night of your first shower and DO NOT SLEEP WITH PETS.   Day of Surgery: Wear Clean/Comfortable clothing the morning of surgery Do not apply any deodorants/lotions.   Remember to brush your teeth WITH YOUR REGULAR TOOTHPASTE.   Please read over the following fact sheets that you were given.

## 2020-10-18 ENCOUNTER — Other Ambulatory Visit: Payer: Self-pay

## 2020-10-18 ENCOUNTER — Encounter (HOSPITAL_COMMUNITY): Payer: Self-pay | Admitting: Specialist

## 2020-10-18 ENCOUNTER — Encounter (HOSPITAL_COMMUNITY)
Admission: RE | Admit: 2020-10-18 | Discharge: 2020-10-18 | Disposition: A | Discharge status: Home / Self Care | Payer: Medicaid Other | Source: Ambulatory Visit | Attending: Specialist | Admitting: Specialist

## 2020-10-18 DIAGNOSIS — Z01812 Encounter for preprocedural laboratory examination: Secondary | ICD-10-CM | POA: Insufficient documentation

## 2020-10-18 LAB — SURGICAL PCR SCREEN
MRSA, PCR: NEGATIVE
Staphylococcus aureus: NEGATIVE

## 2020-10-18 LAB — COMPREHENSIVE METABOLIC PANEL
ALT: 27 U/L (ref 0–44)
AST: 28 U/L (ref 15–41)
Albumin: 3.7 g/dL (ref 3.5–5.0)
Alkaline Phosphatase: 97 U/L (ref 38–126)
Anion gap: 13 (ref 5–15)
BUN: 10 mg/dL (ref 6–20)
CO2: 26 mmol/L (ref 22–32)
Calcium: 9.5 mg/dL (ref 8.9–10.3)
Chloride: 98 mmol/L (ref 98–111)
Creatinine, Ser: 0.53 mg/dL (ref 0.44–1.00)
GFR, Estimated: >60 mL/min (ref >60)
Glucose, Bld: 94 mg/dL (ref 70–99)
Potassium: 3.4 mmol/L — ABNORMAL LOW (ref 3.5–5.1)
Sodium: 137 mmol/L (ref 135–145)
Total Bilirubin: 0.6 mg/dL (ref 0.3–1.2)
Total Protein: 7.7 g/dL (ref 6.5–8.1)

## 2020-10-18 LAB — URINALYSIS, ROUTINE W REFLEX MICROSCOPIC
Bilirubin Urine: NEGATIVE
Glucose, UA: NEGATIVE mg/dL
Hgb urine dipstick: NEGATIVE
Ketones, ur: NEGATIVE mg/dL
Leukocytes,Ua: NEGATIVE
Nitrite: NEGATIVE
Protein, ur: NEGATIVE mg/dL
Specific Gravity, Urine: 1.021 (ref 1.005–1.030)
pH: 5 (ref 5.0–8.0)

## 2020-10-18 LAB — CBC
HCT: 42.8 % (ref 36.0–46.0)
Hemoglobin: 14.4 g/dL (ref 12.0–15.0)
MCH: 31.9 pg (ref 26.0–34.0)
MCHC: 33.6 g/dL (ref 30.0–36.0)
MCV: 94.7 fL (ref 80.0–100.0)
Platelets: 286 10*3/uL (ref 150–400)
RBC: 4.52 MIL/uL (ref 3.87–5.11)
RDW: 14.3 % (ref 11.5–15.5)
WBC: 7 10*3/uL (ref 4.0–10.5)
nRBC: 0 % (ref 0.0–0.2)

## 2020-10-18 LAB — TYPE AND SCREEN
ABO/RH(D): A POS
Antibody Screen: NEGATIVE

## 2020-10-18 LAB — GLUCOSE, CAPILLARY: Glucose-Capillary: 122 mg/dL — ABNORMAL HIGH (ref 70–99)

## 2020-10-18 LAB — PROTIME-INR
INR: 0.9 (ref 0.8–1.2)
Prothrombin Time: 12 seconds (ref 11.4–15.2)

## 2020-10-18 NOTE — Progress Notes (Signed)
PCP - Dr. Tia Masker  Cardiologist - Denies  Chest x-ray - 01/15/20 (E)  EKG - 06/06/20 (E)  Stress Test - Denies  ECHO - 06/21/17 (E)  Cardiac Cath - 06/23/17 (E)  AICD-na PM-na LOOP-na  Sleep Study - Denies CPAP - Denies  LABS- 10/18/20: CBC, CMP, PT, T/S, PCR, UA 10/23/20: COVID  ASA- LD- 12/22  ERAS- Yes- Water x1  HA1C- 09/06/20: 6.4 (E) Fasting Blood Sugar - 82-122, today 122 Checks Blood Sugar __3___ times a week  Anesthesia- No  Pt denies having chest pain, sob, or fever at this time. All instructions explained to the pt, with a verbal understanding of the material. Pt agrees to go over the instructions while at home for a better understanding. Pt also instructed to self quarantine after being tested for COVID-19. The opportunity to ask questions was provided.   Coronavirus Screening  Have you experienced the following symptoms:  Cough yes/no: No Fever (>100.4F)  yes/no: No Runny nose yes/no: No Sore throat yes/no: No Difficulty breathing/shortness of breath  yes/no: No  Have you or a family member traveled in the last 14 days and where? yes/no: No   If the patient indicates "YES" to the above questions, their PAT will be rescheduled to limit the exposure to others and, the surgeon will be notified. THE PATIENT WILL NEED TO BE ASYMPTOMATIC FOR 14 DAYS.   If the patient is not experiencing any of these symptoms, the PAT nurse will instruct them to NOT bring anyone with them to their appointment since they may have these symptoms or traveled as well.   Please remind your patients and families that hospital visitation restrictions are in effect and the importance of the restrictions.

## 2020-10-18 NOTE — Progress Notes (Addendum)
Your procedure is scheduled on Tuesday, December 28th.             Report to [facility] Main Entrance "A" at 5:30 A.M., and check in at the Admitting office.             Call this number if you have problems the morning of surgery:             [phone]  Call [phone] if you have any questions prior to your surgery date Monday-Friday 8am-4pm               Remember:             Do not eat after midnight on Dec. 27th  You may drink clear liquids until 4:30 AM the morning of your surgery.   Clear liquids allowed (No or Low Sugar) are: Water, Non-Citrus Juices (without pulp), Carbonated Beverages, Clear Tea, Black Coffee Only, and Gatorade  Please complete your PRE-SURGERY Water that was provided to you by 4:30AM the morning of surgery.  Please, if able, drink it in one setting. DO NOT SIP.                           Take these medicines the morning of surgery with A SIP OF WATER              Tylenol - if needed             Acyclovir (Zovirax)             Albuterol inhaler - if needed (bring with you on the day of surgery)             Allopurinol (Zyloprim)             Amlodipine (Norvasc)             Atorvastatin (Lipitor)             Benadryl - if needed             Trelegy Ellipta Inhaler - if needed             Gabapentin (Neurontin)             Metoprolol (Lopressor)             Omeprazole (Prilosec)  Follow your surgeon's instructions on when to stop Aspirin.  If no instructions were given by your surgeon then you will need to call the office to get those instructions.    As of today, STOP taking any Aspirin (unless otherwise instructed by your surgeon) Aleve, Naproxen, Ibuprofen, Motrin, Advil, Goody's, BC's, all herbal medications, fish oil, and all vitamins.   WHAT DO I DO ABOUT MY DIABETES MEDICATION?    Do not take oral diabetes medicines (pills) the morning of surgery. - Metformin   HOW TO MANAGE YOUR DIABETES BEFORE AND AFTER SURGERY  Why is it  important to control my blood sugar before and after surgery?  Improving blood sugar levels before and after surgery helps healing and can limit problems.  A way of improving blood sugar control is eating a healthy diet by: ?  Eating less sugar and carbohydrates ?  Increasing activity/exercise ?  Talking with your doctor about reaching your blood sugar goals  High blood sugars (greater than 180 mg/dL) can raise your risk of infections and slow your recovery, so you will need to focus on controlling your diabetes during the weeks before  surgery.  Make sure that the doctor who takes care of your diabetes knows about your planned surgery including the date and location.  How do I manage my blood sugar before surgery?  Check your blood sugar at least 4 times a day, starting 2 days before surgery, to make sure that the level is not too high or low.  Check your blood sugar the morning of your surgery when you wake up and every 2 hours until you get to the Short Stay unit. ? If your blood sugar is less than 70 mg/dL, you will need to treat for low blood sugar:  Do not take insulin.  Treat a low blood sugar (less than 70 mg/dL) with  cup of clear juice (cranberry or apple), 4glucose tablets, OR glucose gel.  Recheck blood sugar in 15 minutes after treatment (to make sure it is greater than 70 mg/dL). If your blood sugar is not greater than 70 mg/dL on recheck, call [phone] for further instructions.  Report your blood sugar to the short stay nurse when you get to Short Stay.   If you are admitted to the hospital after surgery: ? Your blood sugar will be checked by the staff and you will probably be given insulin after surgery (instead of oral diabetes medicines) to make sure you have good blood sugar levels. ? The goal for blood sugar control after surgery is 80-180 mg/dL.               DAY OF SURGERY: Do not wear jewelry, make up, or nail  polish Do notwear lotions, powders, perfumes, or deodorant. Do notshave 48 hours prior to surgery.  Do notbring valuables to the hospital. [facility] is not responsible for any belongings or valuables.  Do NOT Smoke (Tobacco/Vaping) or drink Alcohol 24 hours prior to your procedure If you use a CPAP at night, you may bring all equipment for your overnight stay.  Contacts, glasses, dentures or bridgework may not be worn into surgery.    For patients admitted to the hospital, discharge time will be determined by your treatment team.  Patients discharged the day of surgery will not be allowed to drive home, and someone needs to stay with them for 24 hours.    Special instructions:   [place]- Preparing For Surgery  Before surgery, you can play an important role. Because skin is not sterile, your skin needs to be as free of germs as possible. You can reduce the number of germs on your skin by washing with CHG (chlorahexidine gluconate) Soap before surgery.  CHG is an antiseptic cleaner which kills germs and bonds with the skin to continue killing germs even after washing.    Oral Hygiene is also important to reduce your risk of infection.  Remember - BRUSH YOUR TEETH THE MORNING OF SURGERY WITH YOUR REGULAR TOOTHPASTE  Please do not use if you have an allergy to CHG or antibacterial soaps. If your skin becomes reddened/irritated stop using the CHG.  Do not shave (including legs and underarms) for at least 48 hours prior to first CHG shower. It is OK to shave your face.  Please follow these instructions carefully.  1. Shower the NIGHT BEFORE SURGERY and the MORNING OF SURGERY with CHG Soap.   2. If you chose to wash your hair, wash your hair first as usual with your normal shampoo.  3. After you  shampoo, rinse your hair and body thoroughly to remove the shampoo.  4. Use CHG as you would any other liquid soap. You can apply CHG directly to the skin and wash gently with a scrungie or a clean washcloth.   5. Apply the CHG Soap to your body ONLY FROM THE NECK DOWN.  Do not use on open wounds or open sores. Avoid contact with your eyes, ears, mouth and genitals (private parts). Wash Face and genitals (private parts)  with your normal soap.   6. Wash thoroughly, paying special attention to the area where your surgery will be performed.  7. Thoroughly rinse your body with warm water from the neck down.  8. DO NOT shower/wash with your normal soap after using and rinsing off the CHG Soap.  9. Pat yourself dry with a CLEAN TOWEL.  10. Wear CLEAN PAJAMAS to bed the night before surgery  11. Place CLEAN SHEETS on your bed the night of your first shower and DO NOT SLEEP WITH PETS.   Day of Surgery: Wear Clean/Comfortable clothing the morning of surgery Do notapply any deodorants/lotions.  Remember to brush your teeth WITH YOUR REGULAR TOOTHPASTE.  Please read over the following fact sheets that you were given.

## 2020-10-23 ENCOUNTER — Other Ambulatory Visit (HOSPITAL_COMMUNITY)
Admission: RE | Admit: 2020-10-23 | Discharge: 2020-10-23 | Disposition: A | Discharge status: Home / Self Care | Payer: Medicaid Other | Source: Ambulatory Visit | Attending: Specialist | Admitting: Specialist

## 2020-10-23 ENCOUNTER — Encounter: Payer: Self-pay | Admitting: Specialist

## 2020-10-23 DIAGNOSIS — Z20822 Contact with and (suspected) exposure to covid-19: Secondary | ICD-10-CM | POA: Insufficient documentation

## 2020-10-23 DIAGNOSIS — Z01812 Encounter for preprocedural laboratory examination: Secondary | ICD-10-CM | POA: Diagnosis not present

## 2020-10-23 LAB — SARS CORONAVIRUS 2 (TAT 6-24 HRS): SARS Coronavirus 2: NEGATIVE

## 2020-10-23 MED ORDER — BUPIVACAINE LIPOSOME 1.3 % IJ SUSP
20.0000 mL | Freq: Once | INTRAMUSCULAR | Status: AC
  Administered 2020-10-24: 15 mL
  Filled 2020-10-23: qty 20

## 2020-10-23 MED ORDER — DEXTROSE 5 % IV SOLN
3.0000 g | INTRAVENOUS | Status: AC
  Administered 2020-10-24 (×2): 3 g via INTRAVENOUS
  Filled 2020-10-23: qty 3

## 2020-10-23 NOTE — H&P (Signed)
Sydney Williams is an 54 y.o. female.   Chief Complaint: Low back pain and lower extremity radiculopathy HPI: 54 year old black female history of L2-L5 stenosis/HNP comes in for preop evaluation.  She continues have ongoing low back pain and left lower extremity radiculopathy.  Symptoms unchanged from previous visit.  She is wanting to proceed with LEFT L2-3, L3-4 AND L4-5 TRANSFORAMINAL LUMBAR INTERBODY FUSIONS WITH PEDICLE SCREWS, RODS, CAGES X3, LOCAL BONE GRAFT, ALLOGRAFT BONE GRAFT AND VIVIGEN as scheduled. We received preop medical and cardiac clearance.  Today history physical performed.   Patient states that she has some concern about postop pain management.  Percocet makes her have "weird dreams" and Vicodin has caused a rash in the past.  She states that she usually usually does okay with Tylenol with codeine.  Advised patient that she may need something stronger after this procedure and we may consider trying Nucynta.  Past Medical History:  Diagnosis Date  . Asthma    2015 hospitaliation for asthma  . Diabetes mellitus    Type II  . Family history of breast cancer   . Fibroids 2010  . Genital herpes   . Genital herpes 11/30/2014  . GERD (gastroesophageal reflux disease)   . Hyperlipidemia   . Hypertension   . Impaired fasting blood sugar   . Migraines   . Obesity   . Right carpal tunnel syndrome   . Smoker     Past Surgical History:  Procedure Laterality Date  . CARDIAC CATHETERIZATION    . CARPAL TUNNEL RELEASE Right 08/30/2020   Procedure: RIGHT CARPAL TUNNEL RELEASE;  Surgeon: Leandrew Koyanagi, MD;  Location: [facility];  Service: Orthopedics;  Laterality: Right;  . COLONOSCOPY  03/13/2016   Dr. Carlean Purl, normal, repeat 2027  . CYST EXCISION     left neck/postauricular region, benign  . DILATION AND CURETTAGE OF UTERUS    . LAPAROSCOPIC ABDOMINAL EXPLORATION     removal of ectopic preg  . LEFT HEART CATH AND CORONARY ANGIOGRAPHY N/A 06/23/2017   Procedure:  LEFT HEART CATH AND CORONARY ANGIOGRAPHY;  Surgeon: Nelva Bush, MD;  Location: [facility];  Service: Cardiovascular;  Laterality: N/A;  . TUBAL LIGATION      Family History  Problem Relation Age of Onset  . Diabetes Mother   . Hypertension Mother   . Aneurysm Mother   . Stroke Mother   . Cancer Mother        cervical cancer  . Cancer Maternal Aunt        breast  . Cancer Cousin        breast/breast  . Lupus Cousin   . Heart disease Neg Hx   . Colon cancer Neg Hx   . Allergic rhinitis Neg Hx   . Angioedema Neg Hx   . Asthma Neg Hx   . Atopy Neg Hx   . Eczema Neg Hx   . Immunodeficiency Neg Hx   . Urticaria Neg Hx    Social History:  reports that she has been smoking cigarettes. She has a 3.50 pack-year smoking history. She has never used smokeless tobacco. She reports current alcohol use. She reports that she does not use drugs.  Allergies:  Allergies  Allergen Reactions  . Lisinopril Swelling  . Potassium Chloride Shortness Of Breath and Swelling    Tolerates IV KCl, reaction only to PO product  . Pneumococcal Vaccines Swelling and Other (See Comments)    Reaction:  Swelling at injection site  .  Vicodin [Hydrocodone-Acetaminophen] Itching and Rash    No medications prior to admission.    Results for orders placed or performed during the hospital encounter of 10/23/20 (from the past 48 hour(s))  SARS CORONAVIRUS 2 (TAT 6-24 HRS) Nasopharyngeal Nasopharyngeal Swab     Status: None   Collection Time: 10/23/20 10:23 AM   Specimen: Nasopharyngeal Swab  Result Value Ref Range   SARS Coronavirus 2 NEGATIVE NEGATIVE    Comment: (NOTE) SARS-CoV-2 target nucleic acids are NOT DETECTED.  The SARS-CoV-2 RNA is generally detectable in upper and lower respiratory specimens during the acute phase of infection. Negative results do not preclude SARS-CoV-2 infection, do not rule out co-infections with other pathogens, and should not be used as the sole basis for  treatment or other patient management decisions. Negative results must be combined with clinical observations, patient history, and epidemiological information. The expected result is Negative.  Fact Sheet for Patients: HairSlick.no  Fact Sheet for Healthcare Providers: quierodirigir.com  This test is not yet approved or cleared by the [place] FDA and  has been authorized for detection and/or diagnosis of SARS-CoV-2 by FDA under an Emergency Use Authorization (EUA). This EUA will remain  in effect (meaning this test can be used) for the duration of the COVID-19 declaration under Se ction 564(b)(1) of the Act, 21 U.S.C. section 360bbb-3(b)(1), unless the authorization is terminated or revoked sooner.  Performed at [facility], [address], [place], [place] [address]    No results found.  Review of Systems  Constitutional: Positive for activity change.  HENT: Negative.   Respiratory: Negative.   Cardiovascular: Negative.   Gastrointestinal: Negative.   Genitourinary: Negative.   Musculoskeletal: Positive for back pain and gait problem.  Neurological: Positive for numbness.  Psychiatric/Behavioral: Negative.     Last menstrual period 06/29/2015. Physical Exam HENT:     Head: Normocephalic and atraumatic.  Eyes:     Extraocular Movements: Extraocular movements intact.     Pupils: Pupils are equal, round, and reactive to light.  Cardiovascular:     Rate and Rhythm: Regular rhythm.  Pulmonary:     Effort: Pulmonary effort is normal. No respiratory distress.     Breath sounds: Normal breath sounds.  Abdominal:     General: There is no distension.  Musculoskeletal:        General: Tenderness present.  Neurological:     Mental Status: She is alert and oriented to person, place, and time.  Psychiatric:        Mood and Affect: Mood normal.      Assessment/Plan L2-L5 stenosis, low back pain and lower  extremity radiculopathy  We will  proceed with LEFT L2-3, L3-4 AND L4-5 TRANSFORAMINAL LUMBAR INTERBODY FUSIONS WITH PEDICLE SCREWS, RODS, CAGES X3, LOCAL BONE GRAFT, ALLOGRAFT BONE GRAFT AND VIVIGEN as scheduled.  Surgical procedure discussed and all questions answered.  Zonia Kief, PA-C 10/23/2020, 4:00 PM

## 2020-10-23 NOTE — Anesthesia Preprocedure Evaluation (Addendum)
Anesthesia Evaluation  Patient identified by MRN, date of birth, ID band Patient awake    Reviewed: Allergy & Precautions, NPO status , Patient's Chart, lab work & pertinent test results  Airway Mallampati: II  TM Distance: >3 FB Neck ROM: Full    Dental  (+) Dental Advisory Given, Teeth Intact   Pulmonary asthma , Current Smoker and Patient abstained from smoking.,    Pulmonary exam normal breath sounds clear to auscultation       Cardiovascular hypertension, Pt. on home beta blockers and Pt. on medications Normal cardiovascular exam Rhythm:Regular Rate:Normal  HLD  LHC 2018 1.Tortuous left coronary artery. No angiographically significant coronary artery disease. 2.Normal left ventricular contraction and filling pressure.  TTE 2018 - Left ventricle: The cavity size was normal. There was mild concentric hypertrophy. Systolic function was normal. The estimated ejection fraction was in the range of 60% to 65%. Wall motion was normal; there were no regional wall motion abnormalities. Features are consistent with a pseudonormal left ventricular filling pattern, with concomitant abnormal relaxation and increased filling pressure (grade 2 diastolic dysfunction).  Doppler parameters are consistent with high ventricular fillingpressure.  - Aortic valve: Trileaflet; mildly thickened, mildly calcified  leaflets.  - Aorta: Ascending aorta diameter: 40 mm (ED).  - Ascending aorta: The ascending aorta was mildly dilated.  - Mitral valve: There was trivial regurgitation.  - Left atrium: The atrium was mildly dilated.    Neuro/Psych  Headaches, PSYCHIATRIC DISORDERS Depression    GI/Hepatic Neg liver ROS, GERD  Medicated,  Endo/Other  diabetes, Type 2, Oral Hypoglycemic AgentsMorbid obesity (BMI 42)  Renal/GU negative Renal ROS  negative genitourinary   Musculoskeletal negative musculoskeletal ROS (+)   Abdominal (+) + obese,    Peds  Hematology negative hematology ROS (+)   Anesthesia Other Findings   Reproductive/Obstetrics                            Anesthesia Physical  Anesthesia Plan  ASA: III  Anesthesia Plan: General   Post-op Pain Management:    Induction: Intravenous  PONV Risk Score and Plan: 3 and Treatment may vary due to age or medical condition, Midazolam, Ondansetron, Dexamethasone and Scopolamine patch - Pre-op  Airway Management Planned: Oral ETT  Additional Equipment: Arterial line  Intra-op Plan:   Post-operative Plan: Extubation in OR  Informed Consent: I have reviewed the patients History and Physical, chart, labs and discussed the procedure including the risks, benefits and alternatives for the proposed anesthesia with the patient or authorized representative who has indicated his/her understanding and acceptance.     Dental advisory given  Plan Discussed with: CRNA  Anesthesia Plan Comments: (2 x PIV Aline)       Anesthesia Quick Evaluation

## 2020-10-24 ENCOUNTER — Telehealth: Payer: Self-pay | Admitting: Critical Care Medicine

## 2020-10-24 ENCOUNTER — Inpatient Hospital Stay (HOSPITAL_COMMUNITY): Payer: Medicaid Other | Admitting: Anesthesiology

## 2020-10-24 ENCOUNTER — Inpatient Hospital Stay (HOSPITAL_COMMUNITY)
Admission: RE | Admit: 2020-10-24 | Discharge: 2020-10-27 | DRG: 454 | Disposition: A | Discharge status: Home / Self Care | Payer: Medicaid Other | Attending: Specialist | Admitting: Specialist

## 2020-10-24 ENCOUNTER — Encounter (HOSPITAL_COMMUNITY): Payer: Self-pay | Admitting: Specialist

## 2020-10-24 ENCOUNTER — Encounter (HOSPITAL_COMMUNITY)
Admission: RE | Disposition: A | Discharge status: Home / Self Care | Payer: Self-pay | Source: Home / Self Care | Attending: Specialist

## 2020-10-24 ENCOUNTER — Inpatient Hospital Stay (HOSPITAL_COMMUNITY): Payer: Medicaid Other

## 2020-10-24 ENCOUNTER — Other Ambulatory Visit: Payer: Self-pay

## 2020-10-24 DIAGNOSIS — K219 Gastro-esophageal reflux disease without esophagitis: Secondary | ICD-10-CM | POA: Diagnosis present

## 2020-10-24 DIAGNOSIS — M5136 Other intervertebral disc degeneration, lumbar region: Secondary | ICD-10-CM

## 2020-10-24 DIAGNOSIS — Z6841 Body Mass Index (BMI) 40.0 and over, adult: Secondary | ICD-10-CM

## 2020-10-24 DIAGNOSIS — Z886 Allergy status to analgesic agent status: Secondary | ICD-10-CM | POA: Diagnosis not present

## 2020-10-24 DIAGNOSIS — Z888 Allergy status to other drugs, medicaments and biological substances status: Secondary | ICD-10-CM | POA: Diagnosis not present

## 2020-10-24 DIAGNOSIS — I10 Essential (primary) hypertension: Secondary | ICD-10-CM | POA: Diagnosis present

## 2020-10-24 DIAGNOSIS — M5116 Intervertebral disc disorders with radiculopathy, lumbar region: Secondary | ICD-10-CM | POA: Diagnosis present

## 2020-10-24 DIAGNOSIS — F17210 Nicotine dependence, cigarettes, uncomplicated: Secondary | ICD-10-CM | POA: Diagnosis present

## 2020-10-24 DIAGNOSIS — E119 Type 2 diabetes mellitus without complications: Secondary | ICD-10-CM | POA: Diagnosis present

## 2020-10-24 DIAGNOSIS — Z8249 Family history of ischemic heart disease and other diseases of the circulatory system: Secondary | ICD-10-CM

## 2020-10-24 DIAGNOSIS — Z833 Family history of diabetes mellitus: Secondary | ICD-10-CM

## 2020-10-24 DIAGNOSIS — Z419 Encounter for procedure for purposes other than remedying health state, unspecified: Secondary | ICD-10-CM

## 2020-10-24 DIAGNOSIS — E785 Hyperlipidemia, unspecified: Secondary | ICD-10-CM | POA: Diagnosis present

## 2020-10-24 DIAGNOSIS — M51369 Other intervertebral disc degeneration, lumbar region without mention of lumbar back pain or lower extremity pain: Secondary | ICD-10-CM

## 2020-10-24 DIAGNOSIS — Z803 Family history of malignant neoplasm of breast: Secondary | ICD-10-CM | POA: Diagnosis not present

## 2020-10-24 DIAGNOSIS — Z887 Allergy status to serum and vaccine status: Secondary | ICD-10-CM | POA: Diagnosis not present

## 2020-10-24 DIAGNOSIS — M48061 Spinal stenosis, lumbar region without neurogenic claudication: Principal | ICD-10-CM | POA: Diagnosis present

## 2020-10-24 DIAGNOSIS — G43909 Migraine, unspecified, not intractable, without status migrainosus: Secondary | ICD-10-CM | POA: Diagnosis present

## 2020-10-24 DIAGNOSIS — Z20822 Contact with and (suspected) exposure to covid-19: Secondary | ICD-10-CM | POA: Diagnosis present

## 2020-10-24 DIAGNOSIS — J45909 Unspecified asthma, uncomplicated: Secondary | ICD-10-CM | POA: Diagnosis present

## 2020-10-24 DIAGNOSIS — Z981 Arthrodesis status: Secondary | ICD-10-CM

## 2020-10-24 DIAGNOSIS — Z8049 Family history of malignant neoplasm of other genital organs: Secondary | ICD-10-CM | POA: Diagnosis not present

## 2020-10-24 DIAGNOSIS — Z823 Family history of stroke: Secondary | ICD-10-CM | POA: Diagnosis not present

## 2020-10-24 HISTORY — PX: BACK SURGERY: SHX140

## 2020-10-24 LAB — POCT I-STAT 7, (LYTES, BLD GAS, ICA,H+H)
Acid-Base Excess: 1 mmol/L (ref 0.0–2.0)
Acid-Base Excess: 7 mmol/L — ABNORMAL HIGH (ref 0.0–2.0)
Bicarbonate: 26.1 mmol/L (ref 20.0–28.0)
Bicarbonate: 32.5 mmol/L — ABNORMAL HIGH (ref 20.0–28.0)
Calcium, Ion: 1.05 mmol/L — ABNORMAL LOW (ref 1.15–1.40)
Calcium, Ion: 1.15 mmol/L (ref 1.15–1.40)
HCT: 34 % — ABNORMAL LOW (ref 36.0–46.0)
HCT: 38 % (ref 36.0–46.0)
Hemoglobin: 11.6 g/dL — ABNORMAL LOW (ref 12.0–15.0)
Hemoglobin: 12.9 g/dL (ref 12.0–15.0)
O2 Saturation: 100 %
O2 Saturation: 100 %
Potassium: 2.5 mmol/L — CL (ref 3.5–5.1)
Potassium: 3 mmol/L — ABNORMAL LOW (ref 3.5–5.1)
Sodium: 145 mmol/L (ref 135–145)
Sodium: 141 mmol/L (ref 135–145)
TCO2: 27 mmol/L (ref 22–32)
TCO2: 34 mmol/L — ABNORMAL HIGH (ref 22–32)
pCO2 arterial: 42.4 mmHg (ref 32.0–48.0)
pCO2 arterial: 49.6 mmHg — ABNORMAL HIGH (ref 32.0–48.0)
pH, Arterial: 7.397 (ref 7.350–7.450)
pH, Arterial: 7.425 (ref 7.350–7.450)
pO2, Arterial: 229 mmHg — ABNORMAL HIGH (ref 83.0–108.0)
pO2, Arterial: 246 mmHg — ABNORMAL HIGH (ref 83.0–108.0)

## 2020-10-24 LAB — GLUCOSE, CAPILLARY
Glucose-Capillary: 128 mg/dL — ABNORMAL HIGH (ref 70–99)
Glucose-Capillary: 177 mg/dL — ABNORMAL HIGH (ref 70–99)
Glucose-Capillary: 190 mg/dL — ABNORMAL HIGH (ref 70–99)

## 2020-10-24 LAB — ABO/RH: ABO/RH(D): A POS

## 2020-10-24 SURGERY — POSTERIOR LUMBAR FUSION 3 LEVEL
Anesthesia: General | Site: Spine Lumbar

## 2020-10-24 MED ORDER — SODIUM CHLORIDE (PF) 0.9 % IJ SOLN
INTRAMUSCULAR | Status: AC
  Filled 2020-10-24: qty 10

## 2020-10-24 MED ORDER — DIPHENHYDRAMINE HCL 25 MG PO CAPS: 25.0000 mg | ORAL_CAPSULE | Freq: Four times a day (QID) | ORAL | Status: DC | PRN

## 2020-10-24 MED ORDER — KETAMINE HCL 50 MG/5ML IJ SOSY
PREFILLED_SYRINGE | INTRAMUSCULAR | Status: AC
  Filled 2020-10-24: qty 10

## 2020-10-24 MED ORDER — BACLOFEN 10 MG PO TABS: 10.0000 mg | ORAL_TABLET | Freq: Three times a day (TID) | ORAL | Status: DC | PRN

## 2020-10-24 MED ORDER — MIDAZOLAM HCL 2 MG/2ML IJ SOLN
INTRAMUSCULAR | Status: DC | PRN
  Administered 2020-10-24: 2 mg via INTRAVENOUS

## 2020-10-24 MED ORDER — FLUTICASONE FUROATE-VILANTEROL 200-25 MCG/INH IN AEPB
1.0000 | INHALATION_SPRAY | Freq: Every day | RESPIRATORY_TRACT | Status: DC
  Filled 2020-10-24: qty 28

## 2020-10-24 MED ORDER — FENTANYL CITRATE (PF) 250 MCG/5ML IJ SOLN
INTRAMUSCULAR | Status: DC | PRN
  Administered 2020-10-24: 25 ug via INTRAVENOUS
  Administered 2020-10-24: 75 ug via INTRAVENOUS
  Administered 2020-10-24 (×2): 25 ug via INTRAVENOUS
  Administered 2020-10-24: 150 ug via INTRAVENOUS
  Administered 2020-10-24: 100 ug via INTRAVENOUS
  Administered 2020-10-24 (×4): 25 ug via INTRAVENOUS

## 2020-10-24 MED ORDER — VANCOMYCIN HCL 1000 MG IV SOLR
INTRAVENOUS | Status: DC | PRN
  Administered 2020-10-24: 1000 mg via TOPICAL

## 2020-10-24 MED ORDER — FENTANYL CITRATE (PF) 250 MCG/5ML IJ SOLN
INTRAMUSCULAR | Status: AC
  Filled 2020-10-24: qty 5

## 2020-10-24 MED ORDER — ROCURONIUM BROMIDE 10 MG/ML (PF) SYRINGE
PREFILLED_SYRINGE | INTRAVENOUS | Status: AC
  Filled 2020-10-24: qty 30

## 2020-10-24 MED ORDER — PHENOL 1.4 % MT LIQD: 1.0000 | OROMUCOSAL | Status: DC | PRN

## 2020-10-24 MED ORDER — ASPIRIN EC 81 MG PO TBEC
81.0000 mg | DELAYED_RELEASE_TABLET | Freq: Every day | ORAL | Status: DC
  Administered 2020-10-24 – 2020-10-27 (×4): 81 mg via ORAL
  Filled 2020-10-24 (×3): qty 1

## 2020-10-24 MED ORDER — BISACODYL 5 MG PO TBEC: 5.0000 mg | DELAYED_RELEASE_TABLET | Freq: Every day | ORAL | Status: DC | PRN

## 2020-10-24 MED ORDER — CHLORHEXIDINE GLUCONATE 0.12 % MT SOLN
15.0000 mL | Freq: Once | OROMUCOSAL | Status: AC
  Administered 2020-10-24: 15 mL via OROMUCOSAL
  Filled 2020-10-24: qty 15

## 2020-10-24 MED ORDER — OXYCODONE HCL 5 MG PO TABS
10.0000 mg | ORAL_TABLET | ORAL | Status: DC | PRN
  Administered 2020-10-25 – 2020-10-27 (×6): 10 mg via ORAL
  Filled 2020-10-24 (×6): qty 2

## 2020-10-24 MED ORDER — VITAMIN D 50 MCG (2000 UT) PO TABS: 2000.0000 [IU] | ORAL_TABLET | Freq: Every day | ORAL | Status: DC

## 2020-10-24 MED ORDER — ONDANSETRON HCL 4 MG/2ML IJ SOLN
INTRAMUSCULAR | Status: DC | PRN
  Administered 2020-10-24: 4 mg via INTRAVENOUS

## 2020-10-24 MED ORDER — CEFAZOLIN SODIUM 1 G IJ SOLR
INTRAMUSCULAR | Status: AC
  Filled 2020-10-24: qty 30

## 2020-10-24 MED ORDER — THROMBIN 20000 UNITS EX SOLR
CUTANEOUS | Status: AC
  Filled 2020-10-24: qty 20000

## 2020-10-24 MED ORDER — ORAL CARE MOUTH RINSE: 15.0000 mL | Freq: Once | OROMUCOSAL | Status: AC

## 2020-10-24 MED ORDER — ALUM & MAG HYDROXIDE-SIMETH 200-200-20 MG/5ML PO SUSP: 30.0000 mL | Freq: Four times a day (QID) | ORAL | Status: DC | PRN

## 2020-10-24 MED ORDER — LACTATED RINGERS IV SOLN: INTRAVENOUS | Status: DC

## 2020-10-24 MED ORDER — ONDANSETRON HCL 4 MG PO TABS: 4.0000 mg | ORAL_TABLET | Freq: Four times a day (QID) | ORAL | Status: DC | PRN

## 2020-10-24 MED ORDER — FUROSEMIDE 20 MG PO TABS: 20.0000 mg | ORAL_TABLET | Freq: Every day | ORAL | Status: DC | PRN

## 2020-10-24 MED ORDER — DEXAMETHASONE SODIUM PHOSPHATE 10 MG/ML IJ SOLN
INTRAMUSCULAR | Status: AC
  Filled 2020-10-24: qty 2

## 2020-10-24 MED ORDER — ESMOLOL HCL 100 MG/10ML IV SOLN
INTRAVENOUS | Status: DC | PRN
  Administered 2020-10-24: 40 mg via INTRAVENOUS
  Administered 2020-10-24 (×2): 30 mg via INTRAVENOUS

## 2020-10-24 MED ORDER — BUPIVACAINE LIPOSOME 1.3 % IJ SUSP
20.0000 mL | INTRAMUSCULAR | Status: DC
  Filled 2020-10-24: qty 20

## 2020-10-24 MED ORDER — ROCURONIUM BROMIDE 10 MG/ML (PF) SYRINGE
PREFILLED_SYRINGE | INTRAVENOUS | Status: DC | PRN
  Administered 2020-10-24: 20 mg via INTRAVENOUS
  Administered 2020-10-24: 50 mg via INTRAVENOUS
  Administered 2020-10-24: 30 mg via INTRAVENOUS
  Administered 2020-10-24: 100 mg via INTRAVENOUS
  Administered 2020-10-24: 80 mg via INTRAVENOUS
  Administered 2020-10-24: 100 mg via INTRAVENOUS
  Administered 2020-10-24: 70 mg via INTRAVENOUS

## 2020-10-24 MED ORDER — THROMBIN 20000 UNITS EX SOLR
CUTANEOUS | Status: DC | PRN
  Administered 2020-10-24: 20 mL via TOPICAL

## 2020-10-24 MED ORDER — BUPIVACAINE HCL (PF) 0.5 % IJ SOLN
INTRAMUSCULAR | Status: AC
  Filled 2020-10-24: qty 30

## 2020-10-24 MED ORDER — THROMBIN 5000 UNITS EX SOLR
CUTANEOUS | Status: AC
  Filled 2020-10-24: qty 10000

## 2020-10-24 MED ORDER — SODIUM CHLORIDE 0.9% FLUSH
3.0000 mL | Freq: Two times a day (BID) | INTRAVENOUS | Status: DC
  Administered 2020-10-24 – 2020-10-26 (×5): 3 mL via INTRAVENOUS

## 2020-10-24 MED ORDER — HYDROMORPHONE HCL 1 MG/ML IJ SOLN
INTRAMUSCULAR | Status: AC
  Filled 2020-10-24: qty 1

## 2020-10-24 MED ORDER — ATORVASTATIN CALCIUM 40 MG PO TABS
40.0000 mg | ORAL_TABLET | Freq: Every day | ORAL | Status: DC
Start: 2020-10-25 — End: 2020-10-27
  Administered 2020-10-25 – 2020-10-27 (×3): 40 mg via ORAL
  Filled 2020-10-24 (×3): qty 1

## 2020-10-24 MED ORDER — FUROSEMIDE 20 MG PO TABS
20.0000 mg | ORAL_TABLET | Freq: Every day | ORAL | Status: DC
  Administered 2020-10-24: 20 mg via ORAL
  Filled 2020-10-24 (×2): qty 1

## 2020-10-24 MED ORDER — ROCURONIUM BROMIDE 10 MG/ML (PF) SYRINGE
PREFILLED_SYRINGE | INTRAVENOUS | Status: AC
  Filled 2020-10-24: qty 20

## 2020-10-24 MED ORDER — SODIUM CHLORIDE 0.9% FLUSH: 3.0000 mL | INTRAVENOUS | Status: DC | PRN

## 2020-10-24 MED ORDER — HYDROMORPHONE HCL 1 MG/ML IJ SOLN
0.2500 mg | INTRAMUSCULAR | Status: DC | PRN
Start: 2020-10-24 — End: 2020-10-24
  Administered 2020-10-24 (×4): 0.5 mg via INTRAVENOUS

## 2020-10-24 MED ORDER — MIDAZOLAM HCL 2 MG/2ML IJ SOLN
INTRAMUSCULAR | Status: AC
  Filled 2020-10-24: qty 2

## 2020-10-24 MED ORDER — ACETAMINOPHEN 10 MG/ML IV SOLN
INTRAVENOUS | Status: AC
  Filled 2020-10-24: qty 100

## 2020-10-24 MED ORDER — PROPOFOL 10 MG/ML IV BOLUS
INTRAVENOUS | Status: DC | PRN
  Administered 2020-10-24: 200 mg via INTRAVENOUS

## 2020-10-24 MED ORDER — VANCOMYCIN HCL 1000 MG IV SOLR
INTRAVENOUS | Status: AC
  Filled 2020-10-24: qty 1000

## 2020-10-24 MED ORDER — DEXMEDETOMIDINE (PRECEDEX) IN NS 20 MCG/5ML (4 MCG/ML) IV SYRINGE
PREFILLED_SYRINGE | INTRAVENOUS | Status: DC | PRN
  Administered 2020-10-24 (×2): 10 ug via INTRAVENOUS

## 2020-10-24 MED ORDER — METHOCARBAMOL 500 MG PO TABS
ORAL_TABLET | ORAL | Status: AC
  Filled 2020-10-24: qty 1

## 2020-10-24 MED ORDER — BUPIVACAINE HCL 0.5 % IJ SOLN
INTRAMUSCULAR | Status: DC | PRN
  Administered 2020-10-24: 15 mL

## 2020-10-24 MED ORDER — TRANEXAMIC ACID-NACL 1000-0.7 MG/100ML-% IV SOLN
INTRAVENOUS | Status: DC | PRN
  Administered 2020-10-24: 1000 mg via INTRAVENOUS

## 2020-10-24 MED ORDER — 0.9 % SODIUM CHLORIDE (POUR BTL) OPTIME
TOPICAL | Status: DC | PRN
  Administered 2020-10-24 (×2): 1000 mL

## 2020-10-24 MED ORDER — VITAMIN D 25 MCG (1000 UNIT) PO TABS
2000.0000 [IU] | ORAL_TABLET | Freq: Every day | ORAL | Status: DC
  Administered 2020-10-24: 2000 [IU] via ORAL
  Filled 2020-10-24 (×2): qty 2

## 2020-10-24 MED ORDER — PHENYLEPHRINE HCL-NACL 10-0.9 MG/250ML-% IV SOLN
INTRAVENOUS | Status: AC
  Filled 2020-10-24: qty 500

## 2020-10-24 MED ORDER — INSULIN ASPART 100 UNIT/ML ~~LOC~~ SOLN
0.0000 [IU] | Freq: Three times a day (TID) | SUBCUTANEOUS | Status: DC
  Administered 2020-10-25 (×2): 2 [IU] via SUBCUTANEOUS
  Administered 2020-10-26: 3 [IU] via SUBCUTANEOUS
  Administered 2020-10-27: 2 [IU] via SUBCUTANEOUS

## 2020-10-24 MED ORDER — SODIUM CHLORIDE 0.9 % IV SOLN: INTRAVENOUS | Status: DC

## 2020-10-24 MED ORDER — DOCUSATE SODIUM 100 MG PO CAPS
100.0000 mg | ORAL_CAPSULE | Freq: Two times a day (BID) | ORAL | Status: DC
  Administered 2020-10-24 – 2020-10-27 (×6): 100 mg via ORAL
  Filled 2020-10-24 (×6): qty 1

## 2020-10-24 MED ORDER — DIPHENHYDRAMINE HCL 25 MG PO TABS: 25.0000 mg | ORAL_TABLET | Freq: Four times a day (QID) | ORAL | Status: DC | PRN

## 2020-10-24 MED ORDER — ACETAMINOPHEN 650 MG RE SUPP: 650.0000 mg | RECTAL | Status: DC | PRN

## 2020-10-24 MED ORDER — OXYCODONE HCL ER 10 MG PO T12A
10.0000 mg | EXTENDED_RELEASE_TABLET | Freq: Two times a day (BID) | ORAL | Status: DC
Start: 2020-10-24 — End: 2020-10-27
  Administered 2020-10-24 – 2020-10-27 (×6): 10 mg via ORAL
  Filled 2020-10-24 (×6): qty 1

## 2020-10-24 MED ORDER — ASPIRIN 81 MG PO TBEC: 81.0000 mg | DELAYED_RELEASE_TABLET | Freq: Every day | ORAL | Status: DC

## 2020-10-24 MED ORDER — METHOCARBAMOL 500 MG PO TABS
500.0000 mg | ORAL_TABLET | Freq: Four times a day (QID) | ORAL | Status: DC | PRN
  Administered 2020-10-24 – 2020-10-25 (×2): 500 mg via ORAL
  Filled 2020-10-24: qty 1

## 2020-10-24 MED ORDER — ALBUMIN HUMAN 5 % IV SOLN: INTRAVENOUS | Status: DC | PRN

## 2020-10-24 MED ORDER — KETAMINE HCL 10 MG/ML IJ SOLN
INTRAMUSCULAR | Status: DC | PRN
  Administered 2020-10-24 (×4): 25 mg via INTRAVENOUS

## 2020-10-24 MED ORDER — MENTHOL 3 MG MT LOZG: 1.0000 | LOZENGE | OROMUCOSAL | Status: DC | PRN

## 2020-10-24 MED ORDER — METOPROLOL TARTRATE 50 MG PO TABS
50.0000 mg | ORAL_TABLET | Freq: Two times a day (BID) | ORAL | Status: DC
  Administered 2020-10-24 – 2020-10-27 (×6): 50 mg via ORAL
  Filled 2020-10-24 (×6): qty 1

## 2020-10-24 MED ORDER — SODIUM CHLORIDE 0.9 % IV SOLN
250.0000 mL | INTRAVENOUS | Status: DC
  Administered 2020-10-24: 250 mL via INTRAVENOUS

## 2020-10-24 MED ORDER — ALLOPURINOL 100 MG PO TABS
100.0000 mg | ORAL_TABLET | Freq: Every day | ORAL | Status: DC
  Administered 2020-10-25 – 2020-10-27 (×3): 100 mg via ORAL
  Filled 2020-10-24 (×3): qty 1

## 2020-10-24 MED ORDER — LIDOCAINE 2% (20 MG/ML) 5 ML SYRINGE
INTRAMUSCULAR | Status: DC | PRN
  Administered 2020-10-24: 100 mg via INTRAVENOUS

## 2020-10-24 MED ORDER — HYDROCHLOROTHIAZIDE 25 MG PO TABS
25.0000 mg | ORAL_TABLET | Freq: Every day | ORAL | Status: DC
  Administered 2020-10-24 – 2020-10-27 (×4): 25 mg via ORAL
  Filled 2020-10-24 (×3): qty 1

## 2020-10-24 MED ORDER — UMECLIDINIUM BROMIDE 62.5 MCG/INH IN AEPB
1.0000 | INHALATION_SPRAY | Freq: Every day | RESPIRATORY_TRACT | Status: DC
  Filled 2020-10-24: qty 7

## 2020-10-24 MED ORDER — ASCORBIC ACID 500 MG PO TABS
500.0000 mg | ORAL_TABLET | Freq: Every day | ORAL | Status: DC
Start: 2020-10-24 — End: 2020-10-27
  Administered 2020-10-24: 500 mg via ORAL
  Filled 2020-10-24: qty 1

## 2020-10-24 MED ORDER — ACETAMINOPHEN 325 MG PO TABS
650.0000 mg | ORAL_TABLET | ORAL | Status: DC | PRN
  Administered 2020-10-26 – 2020-10-27 (×4): 650 mg via ORAL
  Filled 2020-10-24 (×4): qty 2

## 2020-10-24 MED ORDER — HEMOSTATIC AGENTS (NO CHARGE) OPTIME
TOPICAL | Status: DC | PRN
  Administered 2020-10-24 (×2): 1 via TOPICAL

## 2020-10-24 MED ORDER — CEFAZOLIN SODIUM-DEXTROSE 2-4 GM/100ML-% IV SOLN
2.0000 g | Freq: Three times a day (TID) | INTRAVENOUS | Status: AC
  Administered 2020-10-24 – 2020-10-25 (×2): 2 g via INTRAVENOUS
  Filled 2020-10-24 (×2): qty 100

## 2020-10-24 MED ORDER — LACTATED RINGERS IV SOLN: INTRAVENOUS | Status: DC | PRN

## 2020-10-24 MED ORDER — SODIUM CHLORIDE 0.9 % IV SOLN: INTRAVENOUS | Status: DC | PRN

## 2020-10-24 MED ORDER — ACETAMINOPHEN 10 MG/ML IV SOLN
INTRAVENOUS | Status: DC | PRN
  Administered 2020-10-24: 1000 mg via INTRAVENOUS

## 2020-10-24 MED ORDER — MEPERIDINE HCL 25 MG/ML IJ SOLN: 6.2500 mg | INTRAMUSCULAR | Status: DC | PRN

## 2020-10-24 MED ORDER — DEXAMETHASONE SODIUM PHOSPHATE 10 MG/ML IJ SOLN
INTRAMUSCULAR | Status: DC | PRN
  Administered 2020-10-24: 10 mg via INTRAVENOUS

## 2020-10-24 MED ORDER — SUGAMMADEX SODIUM 200 MG/2ML IV SOLN
INTRAVENOUS | Status: DC | PRN
  Administered 2020-10-24: 360 mg via INTRAVENOUS

## 2020-10-24 MED ORDER — PROMETHAZINE HCL 25 MG/ML IJ SOLN: 6.2500 mg | INTRAMUSCULAR | Status: DC | PRN

## 2020-10-24 MED ORDER — SCOPOLAMINE 1 MG/3DAYS TD PT72
MEDICATED_PATCH | TRANSDERMAL | Status: DC | PRN
  Administered 2020-10-24: 1 via TRANSDERMAL

## 2020-10-24 MED ORDER — FLEET ENEMA 7-19 GM/118ML RE ENEM: 1.0000 | ENEMA | Freq: Once | RECTAL | Status: DC | PRN

## 2020-10-24 MED ORDER — GLYCOPYRROLATE PF 0.2 MG/ML IJ SOSY
PREFILLED_SYRINGE | INTRAMUSCULAR | Status: AC
  Filled 2020-10-24: qty 1

## 2020-10-24 MED ORDER — ALBUTEROL SULFATE HFA 108 (90 BASE) MCG/ACT IN AERS: 1.0000 | INHALATION_SPRAY | Freq: Four times a day (QID) | RESPIRATORY_TRACT | Status: DC | PRN

## 2020-10-24 MED ORDER — ONDANSETRON HCL 4 MG/2ML IJ SOLN
4.0000 mg | Freq: Four times a day (QID) | INTRAMUSCULAR | Status: DC | PRN
  Administered 2020-10-24: 4 mg via INTRAVENOUS
  Filled 2020-10-24: qty 2

## 2020-10-24 MED ORDER — PANTOPRAZOLE SODIUM 40 MG PO TBEC
80.0000 mg | DELAYED_RELEASE_TABLET | Freq: Every day | ORAL | Status: DC
  Administered 2020-10-25 – 2020-10-27 (×3): 80 mg via ORAL
  Filled 2020-10-24 (×4): qty 2

## 2020-10-24 MED ORDER — AMLODIPINE BESYLATE 10 MG PO TABS
10.0000 mg | ORAL_TABLET | Freq: Every day | ORAL | Status: DC
  Administered 2020-10-25 – 2020-10-27 (×3): 10 mg via ORAL
  Filled 2020-10-24 (×3): qty 1

## 2020-10-24 MED ORDER — POLYETHYLENE GLYCOL 3350 17 G PO PACK: 17.0000 g | PACK | Freq: Every day | ORAL | Status: DC | PRN

## 2020-10-24 MED ORDER — LIDOCAINE 2% (20 MG/ML) 5 ML SYRINGE
INTRAMUSCULAR | Status: AC
  Filled 2020-10-24: qty 5

## 2020-10-24 MED ORDER — GABAPENTIN 100 MG PO CAPS
100.0000 mg | ORAL_CAPSULE | Freq: Three times a day (TID) | ORAL | Status: DC
  Administered 2020-10-24 – 2020-10-27 (×8): 100 mg via ORAL
  Filled 2020-10-24 (×8): qty 1

## 2020-10-24 MED ORDER — ONDANSETRON HCL 4 MG/2ML IJ SOLN
INTRAMUSCULAR | Status: AC
  Filled 2020-10-24: qty 2

## 2020-10-24 MED ORDER — GLYCOPYRROLATE PF 0.2 MG/ML IJ SOSY
PREFILLED_SYRINGE | INTRAMUSCULAR | Status: DC | PRN
  Administered 2020-10-24: .2 mg via INTRAVENOUS

## 2020-10-24 MED ORDER — PROPOFOL 10 MG/ML IV BOLUS
INTRAVENOUS | Status: AC
  Filled 2020-10-24: qty 20

## 2020-10-24 MED ORDER — FLUTICASONE-UMECLIDIN-VILANT 200-62.5-25 MCG/INH IN AEPB: 1.0000 | INHALATION_SPRAY | Freq: Every day | RESPIRATORY_TRACT | Status: DC

## 2020-10-24 MED ORDER — HYDROMORPHONE HCL 1 MG/ML IJ SOLN
0.5000 mg | INTRAMUSCULAR | Status: DC | PRN
Start: 2020-10-24 — End: 2020-10-27

## 2020-10-24 MED ORDER — KETOROLAC TROMETHAMINE 15 MG/ML IJ SOLN
15.0000 mg | Freq: Four times a day (QID) | INTRAMUSCULAR | Status: AC
  Administered 2020-10-24 – 2020-10-25 (×4): 15 mg via INTRAVENOUS
  Filled 2020-10-24 (×4): qty 1

## 2020-10-24 MED ORDER — ALBUTEROL SULFATE (2.5 MG/3ML) 0.083% IN NEBU: 2.5000 mg | INHALATION_SOLUTION | Freq: Four times a day (QID) | RESPIRATORY_TRACT | Status: DC | PRN

## 2020-10-24 MED ORDER — OXYCODONE HCL 5 MG PO TABS: 5.0000 mg | ORAL_TABLET | ORAL | Status: DC | PRN

## 2020-10-24 MED ORDER — METHOCARBAMOL 1000 MG/10ML IJ SOLN
500.0000 mg | Freq: Four times a day (QID) | INTRAVENOUS | Status: DC | PRN
  Filled 2020-10-24: qty 5

## 2020-10-24 MED ORDER — PHENYLEPHRINE HCL-NACL 10-0.9 MG/250ML-% IV SOLN
INTRAVENOUS | Status: DC | PRN
  Administered 2020-10-24: 50 ug/min via INTRAVENOUS

## 2020-10-24 MED ORDER — ACYCLOVIR 200 MG PO CAPS
200.0000 mg | ORAL_CAPSULE | Freq: Two times a day (BID) | ORAL | Status: DC
  Administered 2020-10-24 – 2020-10-27 (×6): 200 mg via ORAL
  Filled 2020-10-24 (×6): qty 1

## 2020-10-24 MED ORDER — SUCCINYLCHOLINE CHLORIDE 200 MG/10ML IV SOSY: PREFILLED_SYRINGE | INTRAVENOUS | Status: DC | PRN

## 2020-10-24 SURGICAL SUPPLY — 87 items
ADH SKN CLS APL DERMABOND .7 (GAUZE/BANDAGES/DRESSINGS) ×1
AGENT HMST KT MTR STRL THRMB (HEMOSTASIS) ×2
BLADE CLIPPER SURG (BLADE) IMPLANT
BONE CANC CHIPS 20CC PCAN1/4 (Bone Implant) ×2 IMPLANT
BONE VIVIGEN FORMABLE 10CC (Bone Implant) ×2 IMPLANT
BONE VIVIGEN FORMABLE 5.4CC (Bone Implant) ×2 IMPLANT
BUR MATCHSTICK NEURO 3.0 LAGG (BURR) ×2 IMPLANT
BUR NEURO DRILL SOFT 3.0X3.8M (BURR) IMPLANT
BUR PRESCISION 1.7 ELITE (BURR) IMPLANT
BUR RND FLUTED 2.5 (BURR) IMPLANT
CAGE SABLE 10X30 6-12 8D (Cage) ×1 IMPLANT
CAGE SABLE 10X30 9-16 8D (Cage) ×2 IMPLANT
CAP LOCKING THREADED (Cap) ×8 IMPLANT
CHIPS CANC BONE 20CC PCAN1/4 (Bone Implant) ×1 IMPLANT
CNTNR URN SCR LID CUP LEK RST (MISCELLANEOUS) IMPLANT
CONT SPEC 4OZ STRL OR WHT (MISCELLANEOUS) ×4
COVER BACK TABLE 80X110 HD (DRAPES) ×2 IMPLANT
COVER MAYO STAND STRL (DRAPES) IMPLANT
COVER SURGICAL LIGHT HANDLE (MISCELLANEOUS) ×1 IMPLANT
COVER WAND RF STERILE (DRAPES) ×2 IMPLANT
DERMABOND ADVANCED (GAUZE/BANDAGES/DRESSINGS) ×1
DERMABOND ADVANCED .7 DNX12 (GAUZE/BANDAGES/DRESSINGS) ×1 IMPLANT
DRAPE C-ARM 42X72 X-RAY (DRAPES) ×2 IMPLANT
DRAPE C-ARMOR (DRAPES) ×2 IMPLANT
DRAPE INCISE IOBAN 66X45 STRL (DRAPES) ×1 IMPLANT
DRAPE MICROSCOPE LEICA (MISCELLANEOUS) ×2 IMPLANT
DRAPE POUCH INSTRU U-SHP 10X18 (DRAPES) ×2 IMPLANT
DRAPE SURG 17X23 STRL (DRAPES) ×8 IMPLANT
DRSG MEPILEX BORDER 4X12 (GAUZE/BANDAGES/DRESSINGS) ×1 IMPLANT
DRSG MEPILEX BORDER 4X8 (GAUZE/BANDAGES/DRESSINGS) ×2 IMPLANT
DURAPREP 26ML APPLICATOR (WOUND CARE) ×2 IMPLANT
ELECT BLADE 4.0 EZ CLEAN MEGAD (MISCELLANEOUS) ×2
ELECT BLADE 6.5 EXT (BLADE) IMPLANT
ELECT CAUTERY BLADE 6.4 (BLADE) ×2 IMPLANT
ELECT REM PT RETURN 9FT ADLT (ELECTROSURGICAL) ×2
ELECTRODE BLDE 4.0 EZ CLN MEGD (MISCELLANEOUS) ×1 IMPLANT
ELECTRODE REM PT RTRN 9FT ADLT (ELECTROSURGICAL) ×1 IMPLANT
EVACUATOR 1/8 PVC DRAIN (DRAIN) ×1 IMPLANT
GAUZE SPONGE 4X4 12PLY STRL LF (GAUZE/BANDAGES/DRESSINGS) ×1 IMPLANT
GLOVE BIOGEL PI IND STRL 8 (GLOVE) ×1 IMPLANT
GLOVE BIOGEL PI INDICATOR 8 (GLOVE) ×1
GLOVE ECLIPSE 9.0 STRL (GLOVE) ×2 IMPLANT
GLOVE ORTHO TXT STRL SZ7.5 (GLOVE) ×2 IMPLANT
GLOVE SURG 8.5 LATEX PF (GLOVE) ×2 IMPLANT
GOWN STRL REUS W/ TWL LRG LVL3 (GOWN DISPOSABLE) ×1 IMPLANT
GOWN STRL REUS W/TWL 2XL LVL3 (GOWN DISPOSABLE) ×4 IMPLANT
GOWN STRL REUS W/TWL LRG LVL3 (GOWN DISPOSABLE) ×2
GRAFT BNE CANC CHIPS 1-8 20CC (Bone Implant) IMPLANT
GRAFT BNE MATRIX VG FRMBL L 10 (Bone Implant) IMPLANT
GRAFT BNE MATRIX VG FRMBL MD 5 (Bone Implant) IMPLANT
KIT BASIN OR (CUSTOM PROCEDURE TRAY) ×2 IMPLANT
KIT POSITION SURG JACKSON T1 (MISCELLANEOUS) ×2 IMPLANT
KIT TURNOVER KIT B (KITS) ×2 IMPLANT
MILL MEDIUM DISP (BLADE) ×1 IMPLANT
NDL SPNL 18GX3.5 QUINCKE PK (NEEDLE) ×1 IMPLANT
NEEDLE 22X1 1/2 (OR ONLY) (NEEDLE) ×2 IMPLANT
NEEDLE SPNL 18GX3.5 QUINCKE PK (NEEDLE) ×2 IMPLANT
NS IRRIG 1000ML POUR BTL (IV SOLUTION) ×4 IMPLANT
PACK LAMINECTOMY ORTHO (CUSTOM PROCEDURE TRAY) ×2 IMPLANT
PAD ARMBOARD 7.5X6 YLW CONV (MISCELLANEOUS) ×5 IMPLANT
PATTIES SURGICAL .75X.75 (GAUZE/BANDAGES/DRESSINGS) ×2 IMPLANT
PATTIES SURGICAL 1X1 (DISPOSABLE) ×2 IMPLANT
ROD CREO 100MM SPINAL (Rod) ×1 IMPLANT
ROD SPINAL CREO 95MM (Rod) ×1 IMPLANT
ROD TI CVD 5.5MMX90MM (Rod) ×1 IMPLANT
SCREW SPINAL 7.5X50MM CREO AMP (Screw) ×8 IMPLANT
SPONGE LAP 4X18 RFD (DISPOSABLE) ×2 IMPLANT
SPONGE SURGIFOAM ABS GEL 100 (HEMOSTASIS) ×2 IMPLANT
STAPLER VISISTAT 35W (STAPLE) ×1 IMPLANT
SURGIFLO W/THROMBIN 8M KIT (HEMOSTASIS) ×3 IMPLANT
SUT VIC AB 0 CT1 27 (SUTURE) ×4
SUT VIC AB 0 CT1 27XBRD ANBCTR (SUTURE) ×1 IMPLANT
SUT VIC AB 1 CTX 36 (SUTURE) ×4
SUT VIC AB 1 CTX36XBRD ANBCTR (SUTURE) ×1 IMPLANT
SUT VIC AB 2-0 CT1 27 (SUTURE) ×2
SUT VIC AB 2-0 CT1 TAPERPNT 27 (SUTURE) ×1 IMPLANT
SUT VIC AB 3-0 X1 27 (SUTURE) ×2 IMPLANT
SYR 20ML ECCENTRIC (SYRINGE) ×1 IMPLANT
SYR 20ML LL LF (SYRINGE) ×2 IMPLANT
SYR CONTROL 10ML LL (SYRINGE) ×4 IMPLANT
TAPE CLOTH SURG 4X10 WHT LF (GAUZE/BANDAGES/DRESSINGS) ×1 IMPLANT
TOWEL GREEN STERILE (TOWEL DISPOSABLE) ×2 IMPLANT
TOWEL GREEN STERILE FF (TOWEL DISPOSABLE) ×2 IMPLANT
TRAY FOLEY MTR SLVR 16FR STAT (SET/KITS/TRAYS/PACK) ×2 IMPLANT
TULIP CREP AMP 5.5MM (Orthopedic Implant) ×11 IMPLANT
WATER STERILE IRR 1000ML POUR (IV SOLUTION) ×2 IMPLANT
YANKAUER SUCT BULB TIP NO VENT (SUCTIONS) ×2 IMPLANT

## 2020-10-24 NOTE — Transfer of Care (Signed)
Immediate Anesthesia Transfer of Care Note  Patient: Sydney Williams  Procedure(s) Performed: LEFT LUMBAR TWO-THREE, LUMBAR THREE-FOUR AND LUMBAR FOUR- FIVE TRANSFORAMINAL LUMBAR INTERBODY FUSIONS WITH PEDICLE SCREWS, RODS, THREE CAGES, LOCAL BONE GRAFT, ALLOGRAFT BONE GRAFT AND VIVIGEN (N/A Spine Lumbar)  Patient Location: PACU  Anesthesia Type:General  Level of Consciousness: awake, alert , oriented and patient cooperative  Airway & Oxygen Therapy: Patient Spontanous Breathing and Patient connected to face mask oxygen  Post-op Assessment: Report given to RN and Post -op Vital signs reviewed and stable  Post vital signs: Reviewed and stable  Last Vitals:  Vitals Value Taken Time  BP 139/91 10/24/20 1533  Temp    Pulse 83 10/24/20 1540  Resp 12 10/24/20 1540  SpO2 99 % 10/24/20 1540  Vitals shown include unvalidated device data.  Last Pain:  Vitals:   10/24/20 0603  TempSrc:   PainSc: 8       Patients Stated Pain Goal: 3 (10/18/20 0830)  Complications: No complications documented.

## 2020-10-24 NOTE — Anesthesia Procedure Notes (Signed)
Arterial Line Insertion Performed by: Drema Pry, CRNA, CRNA  Patient location: Pre-op. Preanesthetic checklist: patient identified, IV checked, site marked, risks and benefits discussed, surgical consent, monitors and equipment checked, pre-op evaluation, timeout performed and anesthesia consent Lidocaine 1% used for infiltration Left, radial was placed Catheter size: 20 Fr Hand hygiene performed  and maximum sterile barriers used   Attempts: 1 Procedure performed without using ultrasound guided technique. Ultrasound Notes:anatomy identified, needle tip was noted to be adjacent to the nerve/plexus identified and no ultrasound evidence of intravascular and/or intraneural injection Following insertion, dressing applied. Post procedure assessment: normal and unchanged  Patient tolerated the procedure well with no immediate complications.

## 2020-10-24 NOTE — Discharge Instructions (Addendum)

## 2020-10-24 NOTE — Progress Notes (Signed)
Orthopedic Tech Progress Note Patient Details:  Sydney Williams Sep 27, 1966 [id number] Called in order to [employer] for a GREY [employer]EN TLSO brace. Patient ID: [name], female   DOB: 1966/01/28, 54 y.o.   MRN: [id number]   Donald Pore 10/24/2020, 6:25 PM

## 2020-10-24 NOTE — Anesthesia Procedure Notes (Signed)
Procedure Name: Intubation Performed by: Janace Litten, CRNA Pre-anesthesia Checklist: Patient identified, Emergency Drugs available, Suction available and Patient being monitored Patient Re-evaluated:Patient Re-evaluated prior to induction Oxygen Delivery Method: Circle system utilized Preoxygenation: Pre-oxygenation with 100% oxygen Induction Type: IV induction Ventilation: Mask ventilation without difficulty Laryngoscope Size: Mac and 3 Grade View: Grade I Tube type: Oral Number of attempts: 1 Airway Equipment and Method: Stylet and Oral airway Placement Confirmation: ETT inserted through vocal cords under direct vision,  positive ETCO2 and breath sounds checked- equal and bilateral Secured at: 22 cm Tube secured with: Tape Dental Injury: Teeth and Oropharynx as per pre-operative assessment

## 2020-10-24 NOTE — Telephone Encounter (Signed)
I call the Pt, LVM, since I received an email from [facility] financial that the Pt need a new letter of support with the name, address and phone number of the person who is helping her financially, waiting for Pt to call back

## 2020-10-24 NOTE — Brief Op Note (Signed)
10/24/2020  3:13 PM  PATIENT:  Sydney Williams  54 y.o. female  PRE-OPERATIVE DIAGNOSIS:  lumbar degenerative disc disease, lumbar disc herniation, lumbar spinal stenosis L2-3, L3-4, L4-5  POST-OPERATIVE DIAGNOSIS:  lumbar degenerative disc disease, lumbar disc herniation, lumbar spinal stenosis L2-3, L3-4, L4-5  PROCEDURE:  Procedure(s): LEFT LUMBAR TWO-THREE, LUMBAR THREE-FOUR AND LUMBAR FOUR- FIVE TRANSFORAMINAL LUMBAR INTERBODY FUSIONS WITH PEDICLE SCREWS, RODS, THREE CAGES, LOCAL BONE GRAFT, ALLOGRAFT BONE GRAFT AND VIVIGEN (N/A)  SURGEON:  Surgeon(s) and Role:    * Kerrin Champagne, MD - Primary  PHYSICIAN ASSISTANT:Damieon Armendariz Barry Dienes, PA-C  ANESTHESIA:   local and general Dr.Germeroth.   EBL:  700 mL   BLOOD ADMINISTERED:300 CC CELLSAVER  DRAINS: (one medium) Hemovact drain(s) in the left lower lumbar with  Suction Clamped and Urinary Catheter (Foley)   LOCAL MEDICATIONS USED:  MARCAINE 0.5% 1:1 EXPAREL 1.3%  Amount: 32ml  SPECIMEN:  No Specimen  DISPOSITION OF SPECIMEN:  N/A  COUNTS:  YES  TOURNIQUET:  * No tourniquets in log *  DICTATION: .Dragon Dictation  PLAN OF CARE: Admit to inpatient   PATIENT DISPOSITION:  PACU - hemodynamically stable.   Delay start of Pharmacological VTE agent (>24hrs) due to surgical blood loss or risk of bleeding: yes

## 2020-10-24 NOTE — Op Note (Incomplete)
10/24/2020  3:39 PM  PATIENT:  Sydney Williams  54 y.o. female  MRN: [id number]  OPERATIVE REPORT  PRE-OPERATIVE DIAGNOSIS:  lumbar degenerative disc disease, lumbar disc herniation, lumbar spinal stenosis L2-3, L3-4, L4-5  POST-OPERATIVE DIAGNOSIS:  lumbar degenerative disc disease, lumbar disc herniation, lumbar spinal stenosis L2-3, L3-4, L4-5  PROCEDURE:  Procedure(s): LEFT LUMBAR TWO-THREE, LUMBAR THREE-FOUR AND LUMBAR FOUR- FIVE TRANSFORAMINAL LUMBAR INTERBODY FUSIONS WITH PEDICLE SCREWS, RODS, THREE CAGES, LOCAL BONE GRAFT, ALLOGRAFT BONE GRAFT AND VIVIGEN    SURGEON:  Jessy Oto, MD     ASSISTANT:  Benjiman Core, PA-C  (Present throughout the entire procedure and necessary for completion of procedure in a timely manner)     ANESTHESIA:  General,    COMPLICATIONS:  None.     COMPONENTS:  Implant Name Type Inv. Item Serial No. Manufacturer Lot No. LRB No. Used Action  BONE VIVIGEN FORMABLE 5.4CC - [id number] Bone Implant BONE VIVIGEN FORMABLE 5.4CC [id number] [employer]  N/A 1 Implanted  BONE VIVIGEN FORMABLE 10CC - [id number] Bone Implant BONE VIVIGEN FORMABLE 10CC [id number] [employer] [place] [employer]  N/A 1 Implanted  BONE CANC CHIPS 20CC - [id number] Bone Implant BONE CANC CHIPS 20CC [id number] [employer] [place] [employer]  N/A 1 Implanted  CAGE SABLE 10X30 9-16 8D - [id number] Cage CAGE SABLE 10X30 9-16 [place] [id number] N/A 1 Implanted  CAGE SABLE 10X30 9-16 8D - [id number] Cage CAGE SABLE 10X30 9-16 8D  [employer] [id number] N/A 1 Implanted  CAGE SABLE 10X30 6-12 8D - [id number] Cage CAGE SABLE 10X30 6-12 8D  [employer] [id number] N/A 1 Implanted  TULIP CREP AMP 5.5MM - [id number] Orthopedic Implant TULIP CREP AMP 5.5MM  [employer]  N/A 8 Implanted  SCREW SPINAL 7.5X50MM CREO AMP - [id number] Screw SCREW SPINAL 7.5X50MM CREO AMP  [employer]  N/A 8 Implanted  CAP LOCKING THREADED - [id number] Cap CAP LOCKING THREADED   [employer]  N/A 8 Implanted  TULIP CREP AMP 5.5MM - [id number] Orthopedic Implant TULIP CREP AMP 5.5MM  [employer]  N/A 3 Implanted and Explanted  ROD CURVED 5.5MMX90MM TITAN - [id number] Rod ROD CURVED 5.5MMX90MM TITAN  [employer]  N/A 1 Implanted and Explanted  ROD SPINAL CREO 95MM - [id number] Rod ROD SPINAL CREO 95MM  [employer]  N/A 1 Implanted  ROD CREO 100MM SPINAL - [id number] Rod ROD CREO 100MM SPINAL  [employer]  N/A 1 Implanted     PROCEDURE: The patient was met in the holding area, and the appropriate lumbar levels identified and marked with an "X" and my initials. I had discussion with the patient in the preop holding area regarding a change of consent form.The fusion levels are reidentified as L2-3, L3-4 and L4-5 with removal of hardware at the previous fusion site L5-S1. This means extension of the fusion upwards to the L2-3 (Stable vertebrae level)Patient understands the rationale to perform TLIFs at three levels to decompress the right L3-4 and L4-5 lateral recess and foramenal stenosis and to allow for some improved correction of his overall kyphoscoliosis curves. The L1-2 disc is normal on MRI and correction of the deformity from L2-S1 should  Eliminate the need to fuse to the L1 level.  The patient was then transported to OR and was placed under general anestheticwithout difficulty. The patient received appropriate preoperative antibiotic prophylaxis Ancef.  Nursing staff inserted a Foley catheter under sterile conditions. The patient was then turned to a prone position using the [place]  spine frame. PAS. all pressure points well padded the arms at the side to 90 90. Standard prep with DuraPrep solution draped in the usual manner from the lower dorsal spine the mid sacral segment. Iodine Vi-Drape was used and the old incision scar was marked. Time-out procedure was called and correct. Skin in the midline between L1 and S2 was then infiltrated with Marcaine half percent  with 1-200,000 epinephrine total of 10 cc used. Incision was then made ellipsing the old incision scar extending from L1-S2 through the skin and subcutaneous layers down to the patient's lumbodorsal fascia and spinous processes. The incision then carried sharply excising the supraspinous ligament and then continuing the lateral aspect of the spinous processes of L1 L2-L3 L4-L5. Cobb elevator used to carefully elevate the paralumbar muscles off of the posterior elements using electrocautery carefully drilled bleeding and perform dissection of the muscle tissues of the preserving the facet at the L1-2 level and L2-3. Continuing the exposure out laterally to expose the transverse process of L2, L3, L4, L5 and the sacrum in the midline and out to the sacra ala bilaterally exposing the Posterolateral fusion masses and pedicle screws at the L5-S1 level. incision was carried in the midline down to the S2-S3 level area bleeders controlled using electrocautery monopolar electrocautery. The hardware at the L5-S1 level, pedicle screws and rods identified as Monarch hardware from [place]. These were able to be Removed by removing the individual screw cap locking screws with a small hex screw driver Then removing any soft tissue about the rods and sliding the rods out caudally. The pedicle Screws were the easily removed with a rachet socket applied to the screw head.  Clamps were then placed at the L2-3 level and also at the L4spinous process level observed on lateral radiograph the upper clamp was beneath the L2 spinous process. Spinous processes of L2, L3 and L4 marked with  OR marking pen sterilely.Patient has significant  scoliosis deformity with prominence of the posterior aspect of the L2 and L3 joints bilaterally and rotation of the spine to the left side so that the spinous processes was larger to than the left side. Viper retractor was used for the upper part of the incision and the cerebellar retractor distally.  Spinous processes of  L3 and L4, were then resected down to the base the lamina at each segment the lower 50% of the spinous process of L2 was resected and Leksell rongeur used to resect inferior aspect of the lamina on the left side at the L2 level and partially on the right side at L2. The left medial 40% of the facet of L2-3,  L3-4 and L4-5 were resected in order to decompress the left side of the lumbar thecal sac at L2.3, L:3-4 and L4-5 and decompress the left L3,L4 and L5 neuroforamen. Osteotomes and 77m and 339mkerrisons were used for this portion of the decompression. Similarly the right side decompression was carried out but complete facetectomies were perform on the right at L2-3 , L3-4 and L4-5 to provide for exposure of the right side L2-3 and L3-4 and L4-5 neuroforamen for ease of placement of TLIFs (transforaminal lumbar interbody fusion) at each level inferior portions of the lamina and pars were also resected first beginning with the Leksell rongeur and osteotomes and then resecting using 2 and 3 mm Kerrison. Then performing a resection Of the right L4 lamina and medial L4-5 facet a small dural tear occurred over the right posterior aspect of the L4-5 medial  hypertrophic facet,neural elements were intact the dural tear at about 2-3 mm. Cottonoids were placed over the dural tear and continued laminectomy was carried out resecting the central portions of the lamina of L4 and L5 performing foraminotomies on the right side at the L3, L4 and L5 levels. The inferior articular process L2, L3 and L4 were resected on the right side. The L5 nerve root identified bilaterally and the medial aspect of the L5 pedicle. Superior articular process of L5 was then resected from the right side further decompressing the right L5 nerve and providing for exposure of the area just superior to the L5 pedicle for a placement of cage.A large amount of hypertrophic ligmentum flavum was found impressing on the right lateral  recesses at L3-4 and L4-5 and narrowing the respective L3 and L4 neuroforamen. Loupe magnification and headlight were used during this portion procedure.Attention then turned to repair of the dural tear of the right side at L4-5level carefully neural elements were evaluated and found to be normal within the thecal sac and intact and three 4-0 Neurolon sutures used to repair the dura in simple fashion. Plenty of soft tissue overlying the dura was present which allowed for approximation of the relatively less difficult. Valsalva was performed  To 30 mm Hg and demonstrated no leak from area of the L4-5 dural tear. C-arm fluoroscopy was then brought into the field and using C-arm fluoroscopy then a hole made into the lateral aspect of the pedicle of L2 observed in the pedicle using ball-tipped nerve hook and hockey stick nerve probe initial entry was determined on fluoroscopy to be good position alignment so that a ball handled probe was then used to probe the left L2 pedicle to a depth of nearly 40 mm observed on C-arm fluoroscopy to be beyond the midpoint of the lumbar vertebra and then position alignment within the left L2 pedicle this was then removed and the pedicle channel probed demonstrating patency no sign of rupture the cortex of the pedicle. Tapping with a 4 mm screw then 5.0 mm x 40 mm screw was placed on the left side at the L2 level. C-arm fluoroscopy was then brought into the field and using C-arm fluoroscopy then a hole made into the lateral aspect of the pedicle of L3 observed in the pedicle using ball tipped nerve hook and hockey stick nerve probe initial entry was determined on fluoroscopy to be good position alignment so that a ball handled probe was then used to probe the left L3 pedicle to a depth of nearly 45 mm observed on C-arm fluoroscopy to be beyond the midpoint of the lumbar vertebra and then position alignment within the left L3 pedicle this was then removed and the pedicle channel probed  demonstrating patency no sign of rupture the cortex of the pedicle. Tapping with a 5 mm screw then 6.0 mm x 45 mm screw was placed on the left side at the L3 level C-arm fluoroscopy was then brought into the field and using C-arm fluoroscopy then a hole made into the lateral aspect of the pedicle of L4 observed in the pedicle using ball tipped nerve hook and hockey stick nerve probe initial entry was determined on fluoroscopy to be good position alignment so that a ball handled probe was then used to probe the left L4 pedicle to a depth of nearly 45 mm observed on C-arm fluoroscopy to be beyond the midpoint of the lumbar vertebra and then position alignment within the left L4 pedicle this was  then removed and the pedicle channel probed demonstrating patency no sign of rupture the cortex of the pedicle. Tapping with a 5 mm screw then 6.0 mm x 45 mm screw was placed on the left side at the L4 level. The pedicle channel of L5 on the left probed demonstrating patency no sign of rupture the cortex of the pedicle. Previous screw for fixation of this level was measured as 6.0 mm x 45 mm screw so a 7.54mm x 45 mm was placed on the left side at the L5 level. C-arm fluoroscopy was then brought into the field and using C-arm fluoroscopy then the hole into the pedicle of S1 observed with ball-tipped probe the previous S1 pedicle screw removed on this side was 7.0 mm x 37mm. A 8.39mm X 26mm predicle screw was placed on the left side at the S1 level. Attention then turned to the right side and changing to the sides of the table, the right-sided pedicle screws were passed in a similar fashion. C-arm fluoroscopy was used to localize the hole made in the lateral aspect of the pedicle of L2 on the right localizing the pedicle within the spinal canal with nerve hook and hockey-stick nerve probe carefully passed down the center of the L2 pedicle to a depth of nearly 45 mm. Observed on C-arm fluoroscopy to be in good position alignment  channel was probed with a ball-tipped probe ensure patency no sign of cortical disruption. Following tapping with a 5 mm  tap and a 5.0 x 40 mm screw was placed on the right side pedicle at L2. C-arm fluoroscopy was used to localize the hole made in the lateral aspect of the pedicle of L3 on the right localizing the pedicle within the spinal canal with nerve hook and hockey-stick nerve probe carefully passed down the center of the L3 pedicle to a depth of nearly 45 mm. Observed on C-arm fluoroscopy to be in good position alignment channel was probed with a ball-tipped probe ensure patency no sign of cortical disruption. Following tapping with a 5 mm tap and a 6.0 x 45 mm screw was placed on the right side pedicle at L3. C-arm fluoroscopy was used to localize the hole made in the lateral aspect of the pedicle of L 4 on the right localizing the pedicle within the spinal canal with nerve hook and hockey-stick nerve probe carefully passed down the center of the L 4 pedicle to a depth of nearly 40 mm. Observed on C-arm fluoroscopy to be in good position alignment channel was probed with a ball-tipped probe ensure patency no sign of cortical disruption. Following tapping with a 4 mm and 5 mm taps a 5.0 x 40 mm screw was placed on the right side pedicle at L 4. Attempts were made to place a pedicle on the right side at the L5 level however the pedicle itself appeared to be to angled cranially for application of a screw so that after decompressing the right L4 and L5 nerve root this pedicle was not instrumented.C-arm fluoroscopy was used to localize the hole made in the lateral aspect of the pedicle of S1 on the right localizing the pedicle with a ball tipped probe carefully passed down the center of the S1 pedicle to a depth of nearly 40 mm. Observed on C-arm fluoroscopy to be in good position alignment channel was probed with a ball-tipped probe ensure patency no sign of cortical disruption. Previous right S1 pedicle  screw measured 7.82mm x 63mm so that a  8.0 x 40 mm screw was placed on the right side pedicle at S1. Attention then turned to placement of the transforaminal lumbar interbody fusion cages. Using a Penfield 4 the right lateral aspect of the thecal sac at the L4-5 disc space was carefully freed up The thecal sac could then easily be retracted in the posterior lateral aspect of the L4-5 disc was exposed 15 blade scalpel used to incise the posterolateral disc and an osteotome used to resect a small portion of bone off the superior aspect of the posterior superior vertebral body of L5 in order to ease the entry into the L4-5 disc space. A  68m kerrison rongeur was then able to be introduced in the disc space debrided it was quite narrow. 7 mm dilator was used to dialate the L4-5 disc space on the right side attempts were made to dilate further the 8 mm were successful and using small curettes and the disc space was debrided a minimal degenerative disc present in the endplates debrided to bleeding endplate bone. A 8 mm x 239mlordotic Concor[place] cage was carefully packed with morcellized bone graft and the been harvested from previous laminotomies additional bone graft was then packed into the intervertebral disc space using the 7 mm trial  packed the graft multiple times. With this then a 8 mm x 2771mordotic cage was able to be introduced into the disc space on the right side using the correct degree convergence and then impacted then subset beneath the outer aspect of the disc space by about 3 or 4 mm. Bleeding controlled using bipolar electrocautery thrombin soaked gel cottonoids. Then turned to the right L3-4 level similarly the exposure the posterior lateral aspect this was carried out using a Penfield 4 bipolar electrocautery to control small bleeders present. Derricho retractor used to retract the thecal sac and L4 nerve root a 15 blade scalpel was used to incise posterior lateral aspect of the disc the disc space at  this level showed a rather severe narrowing posteriorly was more open anteriorly so that an osteotome again was used to resect a small portion the posterior superior lip of the vertebral body at L4  in order to gain ease of access into the L3-4 disc space. The space was debrided of degenerative disc material using pituitary along root the entire disc space was then debrided of degenerative disc material using pituitary rongeurs curettage down to bleeding bone endplates. Residual disc was resected using pituitary. This space was then carefully assess using spacers  a 9.0mm33m27mm45me provided the best fit the lordotic cage was chosen the intervertebral disc space was then packed with autogenous local bone graft that been harvested from the central laminectomy and this was pack multiple times and impacted with 7 mm trial cage apparatus. This provided excellent bone graft within the intervertebral disc space at L3-4 so that the permanent 9.0 mm cage by 27 mm was then packed with local bone graft placed into the intervertebral disc space and impacted into place in the correct degree of convergence. Bleeding controlled using bipolar electrocautery. The cage was subset beneath the posterior aspect of the L3-4 disc space by about 3-4 mm attempting to open the disc space on the right side than as with the L4-5 level. Bleeding hemostasis then attention was turned to the right side at the L2-3 level the right side portion of the disc was excised on the right side then carefully retracting the thecal sac and the appropriate nerveL3 root  pituitary rongeurs were used to debride this cavity L2-3 level a lamina spreader was necessary in order to open the disc space wide enough to accept instruments to debride the intervertebral disc space. At the L2-3 level the disc was quite tight a 7 mm spreader was able to be introduced and then an 13m spreader. 8 mm x 279mcage was chosen parallel type. A vertebral disc space was debrided of  degenerative disc material and endplate and a curet and pituitary rongeurs. This was completely local bone graft was packed into the disc space multiple time using the smaller 7 mm trial to impact the graft. Finally the 8 mm x 27 mm parallel Concorde cage was packed with local bone graft and this was then impacted into the disc space on the right side at L2-3 to correct the patient's scoliosis concavity. The cage was placed on the right side but also directed anteriorly in order to correct kyphosis noted to be present. Cage was subset beneath the posterior aspect of the disc space 2 or 3 mm. Observed on C-arm fluoroscopy to be in good position alignment. The cages at L2-3 L3-4 and L4-5 were placed anteriorly as best as possible the correct patient's kyphosis that was present. With this then the transforaminal lumbar interbody fusion portion of the case was completed bleeders were controlled using bipolar electrocautery thrombin-soaked Gelfoam were appropriate. 5 screws on the left and 4 screws on the right and then each carefully aligned and tightened or loose and a slight amount to allow for placement of rods. Template was made along the right side first quarter inch titanium rod was then carefully contoured used using the template and divided appropriately. This was then placed into the pedicle screws on the right extending from L2-S1 each of the capsule and carefully placed loosely tightened. Attention turned to the left side were similarly and then screws were carefully adjusted to allow for a better pattern screws to allow for placement of fixation rod template for the rod was then taken and a quarter inch titanium rod was then carefully contoured. This was able to be inserted into the right pedicle screw fasteners and then using a persuader to place caps onto the right L4 fasteners the upper 2 pedicle screw caps appeared to fit quite well. The right S1 rod fastener cap was then tightened 85 pounds then on the  right side disc space at L4-5 and L3-4 and L2-3 screw fasteners compression was obtained on the right side between L4 and S1, L3 andL4, and L2 and L3 by compressing between the fasteners right hand side and tightening the screw caps 85 pounds. Similarly this was done on the left side at L5-S1, L4-5, L3-4 and L2-3 screws using slightl compression and tightened 85 pounds. Iirrigation was carried out with copious amounts of saline solution this was done throughout the case. Cell Saver was used during the case. A single cross-link for the Depuy expedium system was placed at the L3-4 level measured with the measuring tool and the appropriate A6 cross-link was then carefully contoured and applied to the rods and tightened again to 80 foot-pounds bilaterally using the appropriate torque screwdriver. Center screw on the cross-link was then carefully tightened 80 foot-pounds irrigation was carried out observation areas of dural tear demonstrated no leakage present. DuraSeal was then applied to each of the small dural leak areas providing for watertight seal. Permanent C-arm images were obtained in AP and lateral plane. Remaining local bone graft was then applied along  the left lateral posterior lateral region extending from L2 through L5 transverse processes to the left L5-S1 postolateral fusion mass. Excess Gelfoam was then removed the lumbodorsal musculature carefully exam debrided of any devitalized tissue following removal of Vicryl retractors were the bleeders were controlled using electrocautery and the area dorsal lumbar muscle were then approximated in the midline with interrupted #1 Vicryl sutures loose the dorsal fascia was reattached to the spinous process of L1 to superiorly and S1 inferiorly this was done with #1 Vicryl sutures. Subcutaneous layers then approximated using interrupted 0 Vicryl sutures and 2-0 Vicryl sutures. Skin was closed with a running subcutaneous stitch of 4-0 Vicryl Dermabond was applied  then MedPlex bandage. All instrument and sponge counts were correct. The patient was then returned to a supine position on her bed reactivated extubated and returned to the recovery room in satisfactory condition.   Benjiman Core, PA-C perform the duties of assistant surgeon during this case. He was present from the beginning of the case to the end of the case assisting in transfer the patient from his stretcher to the OR table and back to the stretcher at the end of the case. Assisted in careful retraction and suction of the laminectomy site delicate neural structures operating under the operating room microscope. He performed closure of the incision from the fascia to the skin applying the dressing.           Basil Dess  10/24/2020, Basil Dess

## 2020-10-25 LAB — BASIC METABOLIC PANEL
Anion gap: 12 (ref 5–15)
BUN: 6 mg/dL (ref 6–20)
CO2: 25 mmol/L (ref 22–32)
Calcium: 8.5 mg/dL — ABNORMAL LOW (ref 8.9–10.3)
Chloride: 102 mmol/L (ref 98–111)
Creatinine, Ser: 0.64 mg/dL (ref 0.44–1.00)
GFR, Estimated: >60 mL/min (ref >60)
Glucose, Bld: 154 mg/dL — ABNORMAL HIGH (ref 70–99)
Potassium: 3.8 mmol/L (ref 3.5–5.1)
Sodium: 139 mmol/L (ref 135–145)

## 2020-10-25 LAB — GLUCOSE, CAPILLARY
Glucose-Capillary: 107 mg/dL — ABNORMAL HIGH (ref 70–99)
Glucose-Capillary: 136 mg/dL — ABNORMAL HIGH (ref 70–99)
Glucose-Capillary: 134 mg/dL — ABNORMAL HIGH (ref 70–99)

## 2020-10-25 LAB — CBC
HCT: 32.2 % — ABNORMAL LOW (ref 36.0–46.0)
Hemoglobin: 10.7 g/dL — ABNORMAL LOW (ref 12.0–15.0)
MCH: 32.1 pg (ref 26.0–34.0)
MCHC: 33.2 g/dL (ref 30.0–36.0)
MCV: 96.7 fL (ref 80.0–100.0)
Platelets: 168 10*3/uL (ref 150–400)
RBC: 3.33 MIL/uL — ABNORMAL LOW (ref 3.87–5.11)
RDW: 15 % (ref 11.5–15.5)
WBC: 11.3 10*3/uL — ABNORMAL HIGH (ref 4.0–10.5)
nRBC: 0.2 % (ref 0.0–0.2)

## 2020-10-25 NOTE — Evaluation (Signed)
Occupational Therapy Evaluation Patient Details Name: Sydney Williams MRN: [id number] DOB: 10/02/1966 Today's Date: 10/25/2020    History of Present Illness Pt is a 54 y/o female s/p L2-5 TLIF. PMH includes HTN, DM, and asthma.   Clinical Impression   This 54 y/o female presents with the above. PTA pt reports being independent with ADL and functional mobility. Pt pleasant and willing to mobilize with therapies. Pt performing room and hallway level mobility tasks pushing iv pole at supervision level, requiring up to modA for LB ADL today. Initiated education of back precautions in relation to ADL/functional tasks. Pt reports will have available family support at time of d/c for ADL/iADL PRN. She will benefit from continued acute OT services to maximize her safety and independence with ADL and mobility prior to return home. Do not anticipate she will require follow up OT services after d/c.     Follow Up Recommendations  No OT follow up;Supervision/Assistance - 24 hour (24hr initially)    Equipment Recommendations  3 in 1 bedside commode           Precautions / Restrictions Precautions Precautions: Back Precaution Booklet Issued: Yes (comment) Precaution Comments: Verbally reviewed back precautions with pt. Required Braces or Orthoses: Spinal Brace Spinal Brace: Thoracolumbosacral orthotic;Applied in sitting position Restrictions Weight Bearing Restrictions: No      Mobility Bed Mobility Overal bed mobility: Needs Assistance Bed Mobility: Rolling;Sidelying to Sit Rolling: Supervision Sidelying to sit: Supervision       General bed mobility comments: Supervision for safety. Cues for log roll technique    Transfers Overall transfer level: Needs assistance Equipment used: None Transfers: Sit to/from Stand Sit to Stand: Min guard;From elevated surface         General transfer comment: Min guard for safety from elevated surface.    Balance Overall balance assessment:  Mild deficits observed, not formally tested                                         ADL either performed or assessed with clinical judgement   ADL Overall ADL's : Needs assistance/impaired Eating/Feeding: Modified independent;Sitting   Grooming: Supervision/safety;Standing   Upper Body Bathing: Supervision/ safety;Sitting   Lower Body Bathing: Minimal assistance;Sit to/from stand   Upper Body Dressing : Set up;Sitting Upper Body Dressing Details (indicate cue type and reason): donning back brace Lower Body Dressing: Sit to/from stand;Moderate assistance Lower Body Dressing Details (indicate cue type and reason): pt reports able to don her pants this AM but required assist for socks Toilet Transfer: Supervision/safety;Ambulation Toilet Transfer Details (indicate cue type and reason): simulated via transfer to/from EOB and recliner, room and hallway mobility Toileting- Clothing Manipulation and Hygiene: Minimal assistance;Sit to/from stand       Functional mobility during ADLs: Minimal assistance (iv pole)                           Pertinent Vitals/Pain Pain Assessment: Faces Faces Pain Scale: Hurts little more Pain Location: back Pain Descriptors / Indicators: Operative site guarding;Guarding;Aching Pain Intervention(s): Monitored during session;Repositioned;Limited activity within patient's tolerance     Hand Dominance     Extremity/Trunk Assessment Upper Extremity Assessment Upper Extremity Assessment: Overall WFL for tasks assessed   Lower Extremity Assessment Lower Extremity Assessment: Defer to PT evaluation   Cervical / Trunk Assessment Cervical / Trunk Assessment: Other exceptions Cervical /  Trunk Exceptions: s/p lumbar surgery   Communication Communication Communication: No difficulties   Cognition Arousal/Alertness: Awake/alert Behavior During Therapy: WFL for tasks assessed/performed Overall Cognitive Status: Within Functional  Limits for tasks assessed                                     General Comments       Exercises     Shoulder Instructions      Home Living Family/patient expects to be discharged to:: Private residence Living Arrangements: Spouse/significant other Available Help at Discharge: Family Type of Home: House Home Access: [place]: One level     Bathroom Shower/Tub: Teacher, early years/pre: [place]: None          Prior Functioning/Environment Level of Independence: Independent        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        OT Problem List: Decreased range of motion;Decreased strength;Decreased activity tolerance;Impaired balance (sitting and/or standing);Decreased knowledge of use of DME or AE;Decreased knowledge of precautions;Pain      OT Treatment/Interventions: Self-care/ADL training;Therapeutic exercise;Energy conservation;DME and/or AE instruction;Therapeutic activities;Patient/family education;Balance training    OT Goals(Current goals can be found in the care plan section) Acute Rehab OT Goals Patient Stated Goal: to go home OT Goal Formulation: With patient Time For Goal Achievement: 11/08/20 Potential to Achieve Goals: Good  OT Frequency: Min 2X/week   Barriers to D/C:            Co-evaluation              AM-PAC OT "6 Clicks" Daily Activity     Outcome Measure Help from another person eating meals?: None Help from another person taking care of personal grooming?: A Little Help from another person toileting, which includes using toliet, bedpan, or urinal?: A Little Help from another person bathing (including washing, rinsing, drying)?: A Little Help from another person to put on and taking off regular upper body clothing?: A Little Help from another person to put on and taking off regular lower body clothing?: A Lot 6 Click Score: 18   End of Session Equipment Utilized  During Treatment: Back brace Nurse Communication: Mobility status  Activity Tolerance: Patient tolerated treatment well Patient left: in chair;with call bell/phone within reach  OT Visit Diagnosis: Other abnormalities of gait and mobility (R26.89);Pain Pain - part of body:  (back)                Time: 1437-1500 OT Time Calculation (min): 23 min Charges:  OT General Charges $OT Visit: 1 Visit OT Evaluation $OT Eval Low Complexity: 1 Low OT Treatments $Self Care/Home Management : 8-22 mins  Lou Cal, OT Acute Rehabilitation Services Pager [phone] Office [phone]   Raymondo Band 10/25/2020, 5:29 PM

## 2020-10-25 NOTE — Evaluation (Signed)
Physical Therapy Evaluation Patient Details Name: Sydney Williams MRN: [id number] DOB: [birth year] Today's Date: 10/25/2020   History of Present Illness  Pt is a 54 y/o female s/p L2-5 TLIF. PMH includes HTN, DM, and asthma.  Clinical Impression  Patient is s/p above surgery resulting in the deficits listed below (see PT Problem List). Pt requiring min guard A for transfers and ambulation this session. Guarded secondary to pain. Educated about precautions and generalized walking program. Reports family will be able to assist at home. Feel she would benefit from rollator for longer ambulation distance to improve mobility tolerance. Patient will benefit from skilled PT to increase their independence and safety with mobility (while adhering to their precautions) to allow discharge to the venue listed below.     Follow Up Recommendations Outpatient PT (once cleared by MD)    Equipment Recommendations  3in1 (PT);Other (comment) (4 wheeled walker with seat (rollator)    Recommendations for Other Services       Precautions / Restrictions Precautions Precautions: Back Precaution Booklet Issued: No Precaution Comments: Verbally reviewed back precautions with pt. Required Braces or Orthoses: Spinal Brace Spinal Brace: Thoracolumbosacral orthotic;Applied in sitting position Restrictions Weight Bearing Restrictions: No      Mobility  Bed Mobility Overal bed mobility: Needs Assistance Bed Mobility: Rolling;Sidelying to Sit Rolling: Supervision Sidelying to sit: Supervision       General bed mobility comments: Supervision for safety. Cues for log roll technique    Transfers Overall transfer level: Needs assistance Equipment used: None Transfers: Sit to/from Stand Sit to Stand: Min guard;From elevated surface         General transfer comment: Min guard for safety from elevated surface.  Ambulation/Gait Ambulation/Gait assistance: Min guard Gait Distance (Feet): 150  Feet Assistive device: IV Pole Gait Pattern/deviations: Step-through pattern;Decreased stride length Gait velocity: Decreased   General Gait Details: Slow, guarded gait. Pt fatiguing somewhat with ambulation. Min guard for safety. Educated about ambulation program for home.  Stairs            Wheelchair Mobility    Modified Rankin (Stroke Patients Only)       Balance Overall balance assessment: Mild deficits observed, not formally tested                                           Pertinent Vitals/Pain Pain Assessment: Faces Faces Pain Scale: Hurts even more Pain Location: back Pain Descriptors / Indicators: Operative site guarding;Guarding;Aching Pain Intervention(s): Limited activity within patient's tolerance;Monitored during session;Repositioned    Home Living Family/patient expects to be discharged to:: Private residence Living Arrangements: Spouse/significant other Available Help at Discharge: Family Type of Home: House Home Access: Ramped entrance     Home Layout: One level Home Equipment: None      Prior Function Level of Independence: Independent         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     Hand Dominance        Extremity/Trunk Assessment   Upper Extremity Assessment Upper Extremity Assessment: Defer to OT evaluation    Lower Extremity Assessment Lower Extremity Assessment: RLE deficits/detail RLE Deficits / Details: Reports numbness and weakness in RLE.    Cervical / Trunk Assessment Cervical / Trunk Assessment: Other exceptions Cervical / Trunk Exceptions: s/p lumbar surgery  Communication   Communication: No difficulties  Cognition Arousal/Alertness: Awake/alert Behavior During Therapy: WFL for tasks  assessed/performed Overall Cognitive Status: Within Functional Limits for tasks assessed                                        General Comments      Exercises     Assessment/Plan    PT  Assessment Patient needs continued PT services  PT Problem List Decreased strength;Decreased range of motion;Decreased activity tolerance;Decreased balance;Decreased mobility;Decreased knowledge of precautions;Pain       PT Treatment Interventions DME instruction;Gait training;Functional mobility training;Therapeutic activities;Therapeutic exercise;Balance training;Neuromuscular re-education;Patient/family education    PT Goals (Current goals can be found in the Care Plan section)  Acute Rehab PT Goals Patient Stated Goal: to go home PT Goal Formulation: With patient Time For Goal Achievement: 11/08/20 Potential to Achieve Goals: Good    Frequency Min 5X/week   Barriers to discharge        Co-evaluation               AM-PAC PT "6 Clicks" Mobility  Outcome Measure Help needed turning from your back to your side while in a flat bed without using bedrails?: None Help needed moving from lying on your back to sitting on the side of a flat bed without using bedrails?: A Little Help needed moving to and from a bed to a chair (including a wheelchair)?: A Little Help needed standing up from a chair using your arms (e.g., wheelchair or bedside chair)?: A Little Help needed to walk in hospital room?: A Little Help needed climbing 3-5 steps with a railing? : A Lot 6 Click Score: 18    End of Session Equipment Utilized During Treatment: Back brace Activity Tolerance: Patient tolerated treatment well Patient left: in chair;with call bell/phone within reach Nurse Communication: Mobility status PT Visit Diagnosis: Muscle weakness (generalized) (M62.81);Pain Pain - part of body:  (back)    Time: 8657-8469 PT Time Calculation (min) (ACUTE ONLY): 15 min   Charges:   PT Evaluation $PT Eval Low Complexity: 1 Low          Cindee Salt, DPT  Acute Rehabilitation Services  Pager: [phone] Office: [phone]   Sydney Williams 10/25/2020, 10:37 AM

## 2020-10-25 NOTE — Progress Notes (Signed)
     Subjective: 1 Day Post-Op Procedure(s) (LRB): LEFT LUMBAR TWO-THREE, LUMBAR THREE-FOUR AND LUMBAR FOUR- FIVE TRANSFORAMINAL LUMBAR INTERBODY FUSIONS WITH PEDICLE SCREWS, RODS, THREE CAGES, LOCAL BONE GRAFT, ALLOGRAFT BONE GRAFT AND VIVIGEN (N/A) Awake,alert and sitting at bedside.Hemovac intact, dressing dry. Foley discontinued and she has voided. Patient reports pain as moderate.    Objective:   VITALS:  Temp:  [98 F (36.7 C)-99.7 F (37.6 C)] 99.7 F (37.6 C) (12/29 1947) Pulse Rate:  [60-100] 100 (12/29 1947) Resp:  [16-17] 17 (12/29 1947) BP: (108-130)/(74-83) 126/77 (12/29 1947) SpO2:  [94 %-100 %] 99 % (12/29 1947)  Neurologically intact ABD soft Neurovascular intact Intact pulses distally Dorsiflexion/Plantar flexion intact Incision: dressing C/D/I and no drainage   LABS Recent Labs    10/24/20 0814 10/24/20 0832 10/25/20 0258  HGB 11.6* 12.9 10.7*  WBC  --   --  11.3*  PLT  --   --  168   Recent Labs    10/24/20 0832 10/25/20 0258  NA 141 139  K 3.0* 3.8  CL  --  102  CO2  --  25  BUN  --  6  CREATININE  --  0.64  GLUCOSE  --  154*   No results for input(s): LABPT, INR in the last 72 hours.   Assessment/Plan: 1 Day Post-Op Procedure(s) (LRB): LEFT LUMBAR TWO-THREE, LUMBAR THREE-FOUR AND LUMBAR FOUR- FIVE TRANSFORAMINAL LUMBAR INTERBODY FUSIONS WITH PEDICLE SCREWS, RODS, THREE CAGES, LOCAL BONE GRAFT, ALLOGRAFT BONE GRAFT AND VIVIGEN (N/A)  Advance diet Up with therapy  Vira Browns 10/25/2020, 10:47 PMPatient ID: Sydney Williams, female   DOB: 11/17/1965, 54 y.o.   MRN: [id number]

## 2020-10-26 LAB — GLUCOSE, CAPILLARY
Glucose-Capillary: 110 mg/dL — ABNORMAL HIGH (ref 70–99)
Glucose-Capillary: 151 mg/dL — ABNORMAL HIGH (ref 70–99)
Glucose-Capillary: 112 mg/dL — ABNORMAL HIGH (ref 70–99)
Glucose-Capillary: 147 mg/dL — ABNORMAL HIGH (ref 70–99)

## 2020-10-26 LAB — CBC WITH DIFFERENTIAL/PLATELET
Abs Immature Granulocytes: 0.05 10*3/uL (ref 0.00–0.07)
Basophils Absolute: 0 10*3/uL (ref 0.0–0.1)
Basophils Relative: 0 %
Eosinophils Absolute: 0.1 10*3/uL (ref 0.0–0.5)
Eosinophils Relative: 1 %
HCT: 30.2 % — ABNORMAL LOW (ref 36.0–46.0)
Hemoglobin: 9.6 g/dL — ABNORMAL LOW (ref 12.0–15.0)
Immature Granulocytes: 1 %
Lymphocytes Relative: 20 %
Lymphs Abs: 1.7 10*3/uL (ref 0.7–4.0)
MCH: 31 pg (ref 26.0–34.0)
MCHC: 31.8 g/dL (ref 30.0–36.0)
MCV: 97.4 fL (ref 80.0–100.0)
Monocytes Absolute: 0.7 10*3/uL (ref 0.1–1.0)
Monocytes Relative: 8 %
Neutro Abs: 6.3 10*3/uL (ref 1.7–7.7)
Neutrophils Relative %: 70 %
Platelets: 151 10*3/uL (ref 150–400)
RBC: 3.1 MIL/uL — ABNORMAL LOW (ref 3.87–5.11)
RDW: 15.2 % (ref 11.5–15.5)
WBC: 8.9 10*3/uL (ref 4.0–10.5)
nRBC: 0 % (ref 0.0–0.2)

## 2020-10-26 LAB — BASIC METABOLIC PANEL
Anion gap: 10 (ref 5–15)
BUN: 7 mg/dL (ref 6–20)
CO2: 27 mmol/L (ref 22–32)
Calcium: 8.4 mg/dL — ABNORMAL LOW (ref 8.9–10.3)
Chloride: 103 mmol/L (ref 98–111)
Creatinine, Ser: 0.61 mg/dL (ref 0.44–1.00)
GFR, Estimated: >60 mL/min (ref >60)
Glucose, Bld: 115 mg/dL — ABNORMAL HIGH (ref 70–99)
Potassium: 3.2 mmol/L — ABNORMAL LOW (ref 3.5–5.1)
Sodium: 140 mmol/L (ref 135–145)

## 2020-10-26 NOTE — Progress Notes (Signed)
Physical Therapy Treatment Patient Details Name: Sydney Williams MRN: [id number] DOB: 05-18-1966 Today's Date: 10/26/2020    History of Present Illness Pt is a 54 y/o female s/p L2-5 TLIF. PMH includes HTN, DM, and asthma.    PT Comments    The pt was able to make good progress with PT education and mobility this morning, despite minor limitations due to increased pain at surgical site and R hip. The pt was able to complete two bouts of hallway ambulation, but benefits from BUE support to maintain stability, and frequent standing rest breaks due to onset of fatigue and 3/4 DOE. The pt recovers well with standing rest, but requires repeated cues to take standing rest breaks every ~50 ft. The pt was also able to complete navigation of 2 steps with use of bilateral rails and minG for safety, discussed how family can support/assist and pt voiced understanding of stair navigation. The pt was further educated on use of log roll with flat bed as this is much harder for the pt. She was able to complete with cues for technique and minA to bring BLE into bed. The pt will continue to benefit from skilled PT to progress functional strength, power, and stability to allow for improved independence with OOB transfers and gait, as well as to improve endurance and reduce dependence on AD.     Follow Up Recommendations  Outpatient PT     Equipment Recommendations  3in1 (PT) (4-wheel walker with seat (rollator))    Recommendations for Other Services       Precautions / Restrictions Precautions Precautions: Back Precaution Booklet Issued: Yes (comment) Precaution Comments: Verbally reviewed back precautions with pt. Required Braces or Orthoses: Spinal Brace Spinal Brace: Thoracolumbosacral orthotic;Applied in sitting position Restrictions Weight Bearing Restrictions: No    Mobility  Bed Mobility Overal bed mobility: Needs Assistance Bed Mobility: Rolling;Sidelying to Sit;Sit to Sidelying Rolling:  Supervision Sidelying to sit: Min guard     Sit to sidelying: Min assist General bed mobility comments: supervision with VC for log roll from flat bed. pt benefit from minA to return BLE to bed  Transfers Overall transfer level: Needs assistance Equipment used: None;Rolling walker (2 wheeled) Transfers: Sit to/from Stand Sit to Stand: Min guard         General transfer comment: minG with pt reaching for UE support without use of AD. minG with repeated verbal cues for hand placement with use of RW  Ambulation/Gait Ambulation/Gait assistance: Min guard Gait Distance (Feet): 150 Feet (+ 150 ft) Assistive device: 1 person hand held assist;Rolling walker (2 wheeled) Gait Pattern/deviations: Step-through pattern;Decreased stride length;Trunk flexed Gait velocity: 0.44 m/s Gait velocity interpretation: <1.31 ft/sec, indicative of household ambulator General Gait Details: slowed gait, reaching for UE support even with HHA. Pt then given RW and demos improved stability but benefits from cues for upright posture. Cued to take rest breaks as needed   Stairs Stairs: Yes Stairs assistance: Min guard Stair Management: Two rails;Step to pattern;Forwards Number of Stairs: 2 General stair comments: step to with LLE leading up, sideways with RLE leading down. pt reports no change in pain      Balance Overall balance assessment: Mild deficits observed, not formally tested                                          Cognition Arousal/Alertness: Awake/alert Behavior During Therapy: [facility] for  tasks assessed/performed Overall Cognitive Status: Within Functional Limits for tasks assessed                                        Exercises General Exercises - Lower Extremity Long Arc Quad: AROM;Both;10 reps;Seated Heel Raises: AROM;Both;10 reps;Seated    General Comments General comments (skin integrity, edema, etc.): pt with 3/4 DOE with gait, SpO2 88-96% with  good pleth. pt cued to take increased standing rest breaks as DOE and SpO2 improve with standing rest. HR to 130s with continued exertion. all on RA      Pertinent Vitals/Pain Pain Assessment: 0-10 Pain Score: 9  Pain Location: back, surgical, R hip/thigh burning Pain Descriptors / Indicators: Burning;Discomfort;Grimacing;Operative site guarding;Numbness Pain Intervention(s): Limited activity within patient's tolerance;Monitored during session;Repositioned;RN gave pain meds during session           PT Goals (current goals can now be found in the care plan section) Acute Rehab PT Goals Patient Stated Goal: to go home PT Goal Formulation: With patient Time For Goal Achievement: 11/08/20 Potential to Achieve Goals: Good Progress towards PT goals: Progressing toward goals    Frequency    Min 5X/week      PT Plan Current plan remains appropriate       AM-PAC PT "6 Clicks" Mobility   Outcome Measure  Help needed turning from your back to your side while in a flat bed without using bedrails?: A Little Help needed moving from lying on your back to sitting on the side of a flat bed without using bedrails?: A Little Help needed moving to and from a bed to a chair (including a wheelchair)?: A Little Help needed standing up from a chair using your arms (e.g., wheelchair or bedside chair)?: A Little Help needed to walk in hospital room?: A Little Help needed climbing 3-5 steps with a railing? : A Little 6 Click Score: 18    End of Session Equipment Utilized During Treatment: Back brace Activity Tolerance: Patient tolerated treatment well Patient left: in bed;with call bell/phone within reach;with family/visitor present Nurse Communication: Mobility status PT Visit Diagnosis: Muscle weakness (generalized) (M62.81);Pain Pain - Right/Left: Right Pain - part of body: Hip (back)     Time: [id number] PT Time Calculation (min) (ACUTE ONLY): 42 min  Charges:  $Gait Training: 23-37  mins $Therapeutic Activity: 8-22 mins                     Karma Ganja, PT, DPT   Acute Rehabilitation Department Pager #: [phone]   Otho Bellows 10/26/2020, 11:17 AM

## 2020-10-26 NOTE — Anesthesia Postprocedure Evaluation (Signed)
Anesthesia Post Note  Patient: Sydney Williams  Procedure(s) Performed: LEFT LUMBAR TWO-THREE, LUMBAR THREE-FOUR AND LUMBAR FOUR- FIVE TRANSFORAMINAL LUMBAR INTERBODY FUSIONS WITH PEDICLE SCREWS, RODS, THREE CAGES, LOCAL BONE GRAFT, ALLOGRAFT BONE GRAFT AND VIVIGEN (N/A Spine Lumbar)     Patient location during evaluation: PACU Anesthesia Type: General Level of consciousness: sedated and patient cooperative Pain management: pain level controlled Vital Signs Assessment: post-procedure vital signs reviewed and stable Respiratory status: spontaneous breathing Cardiovascular status: stable Anesthetic complications: no   No complications documented.  Last Vitals:  Vitals:   10/26/20 0413 10/26/20 0803  BP: 129/81 133/79  Pulse: 91 93  Resp: 20 18  Temp: 37.2 C 37.4 C  SpO2: 97% 96%    Last Pain:  Vitals:   10/26/20 0930  TempSrc:   PainSc: 10-Worst pain ever                 Lewie Loron

## 2020-10-26 NOTE — Progress Notes (Signed)
     Subjective: 2 Days Post-Op Procedure(s) (LRB): LEFT LUMBAR TWO-THREE, LUMBAR THREE-FOUR AND LUMBAR FOUR- FIVE TRANSFORAMINAL LUMBAR INTERBODY FUSIONS WITH PEDICLE SCREWS, RODS, THREE CAGES, LOCAL BONE GRAFT, ALLOGRAFT BONE GRAFT AND VIVIGEN (N/A)  Patient reports pain as mild.    Objective:   VITALS:  Temp:  [98.9 F (37.2 C)-102.5 F (39.2 C)] 99.7 F (37.6 C) (12/30 1930) Pulse Rate:  [91-99] 99 (12/30 1930) Resp:  [18-20] 19 (12/30 1930) BP: (119-133)/(72-81) 119/72 (12/30 1930) SpO2:  [96 %-98 %] 96 % (12/30 1930)  Neurologically intact ABD soft Neurovascular intact Sensation intact distally Intact pulses distally Dorsiflexion/Plantar flexion intact Incision: dressing C/D/I, no drainage, moderate drainage and hemovac discontinued No cellulitis present Compartment soft Dressing partially changed   LABS Recent Labs    10/24/20 0814 10/24/20 0832 10/25/20 0258 10/26/20 0303  HGB 11.6* 12.9 10.7* 9.6*  WBC  --   --  11.3* 8.9  PLT  --   --  168 151   Recent Labs    10/25/20 0258 10/26/20 0303  NA 139 140  K 3.8 3.2*  CL 102 103  CO2 25 27  BUN 6 7  CREATININE 0.64 0.61  GLUCOSE 154* 115*   No results for input(s): LABPT, INR in the last 72 hours.   Assessment/Plan: 2 Days Post-Op Procedure(s) (LRB): LEFT LUMBAR TWO-THREE, LUMBAR THREE-FOUR AND LUMBAR FOUR- FIVE TRANSFORAMINAL LUMBAR INTERBODY FUSIONS WITH PEDICLE SCREWS, RODS, THREE CAGES, LOCAL BONE GRAFT, ALLOGRAFT BONE GRAFT AND VIVIGEN (N/A)  Advance diet Up with therapy D/C IV fluids Plan for discharge tomorrow  Vira Browns 10/26/2020, 11:59 PMPatient ID: Sydney Williams, female   DOB: 15-Feb-1966, 54 y.o.   MRN: [id number]

## 2020-10-26 NOTE — Progress Notes (Signed)
Occupational Therapy Treatment Patient Details Name: Sydney Williams MRN: [id number] DOB: 11-07-1965 Today's Date: 10/26/2020    History of present illness Pt is a 54 y/o female s/p L2-5 TLIF. PMH includes HTN, DM, and asthma.   OT comments  Pt progressing towards establishes OT goals. Pt performing toileting, hand hygiene, and functional mobility in hallway with rollator at Supervision level. Reviewing education on compensatory techniques for bed mobility, brace management, LB ADLs, toileting, and functional transfer. Continue to recommend dc to home once medically stable. All acute OT needs met and will sign off.   Follow Up Recommendations  No OT follow up;Supervision/Assistance - 24 hour    Equipment Recommendations  3 in 1 bedside commode    Recommendations for Other Services      Precautions / Restrictions Precautions Precautions: Back Precaution Booklet Issued: Yes (comment) Precaution Comments: Verbally reviewed back precautions with pt. Required Braces or Orthoses: Spinal Brace Spinal Brace: Thoracolumbosacral orthotic;Applied in sitting position       Mobility Bed Mobility Overal bed mobility: Needs Assistance Bed Mobility: Rolling;Sidelying to Sit;Sit to Sidelying Rolling: Supervision Sidelying to sit: Supervision     Sit to sidelying: Supervision General bed mobility comments: Supervision for safety  Transfers Overall transfer level: Needs assistance Equipment used: None;Rolling walker (2 wheeled) (rollator) Transfers: Sit to/from Stand Sit to Stand: Supervision         General transfer comment: Supervision for safety    Balance Overall balance assessment: Mild deficits observed, not formally tested                                         ADL either performed or assessed with clinical judgement   ADL Overall ADL's : Needs assistance/impaired     Grooming: Wash/dry hands;Standing;Supervision/safety Grooming Details (indicate cue  type and reason): Supervision for safety         Upper Body Dressing : Set up;Sitting;Supervision/safety Upper Body Dressing Details (indicate cue type and reason): donning/doffing brace at EOB Lower Body Dressing: Supervision/safety;Moderate assistance;Sit to/from stand;With caregiver independent assisting Lower Body Dressing Details (indicate cue type and reason): Pt reporting her husband will assist as needed. Demonstrating techniques to don pants without bending forward Toilet Transfer: Supervision/safety;Ambulation;RW;BSC (BSC over toilet)   Toileting- Clothing Manipulation and Hygiene: Supervision/safety;Sit to/from stand;Sitting/lateral lean Toileting - Clothing Manipulation Details (indicate cue type and reason): Supervision for peri care; reviewed compensatory techniques for posterior peri care. Pt reports husband will assist   Tub/Shower Transfer Details (indicate cue type and reason): Educating pt on use of shower seat (3n1) and safe transfer techniques Functional mobility during ADLs: Supervision/safety (rollator) General ADL Comments: Reviewing all education on back precautions, brace manaement, LB ADLs, toileting, tub transfer, and mobility.     Vision       Perception     Praxis      Cognition Arousal/Alertness: Awake/alert;Lethargic;Suspect due to medications Behavior During Therapy: [facility] for tasks assessed/performed;Flat affect Overall Cognitive Status: Within Functional Limits for tasks assessed                                 General Comments: Slightly lethargic; suspect from pain medication        Exercises     Shoulder Instructions       General Comments      Pertinent Vitals/ Pain  Pain Assessment: Faces Faces Pain Scale: Hurts little more Pain Location: back, surgical, R hip/thigh burning Pain Descriptors / Indicators: Discomfort;Grimacing;Operative site guarding Pain Intervention(s): Monitored during session;Limited activity  within patient's tolerance;Repositioned;Premedicated before session  Home Living                                          Prior Functioning/Environment              Frequency  Min 2X/week        Progress Toward Goals  OT Goals(current goals can now be found in the care plan section)  Progress towards OT goals: Progressing toward goals  Acute Rehab OT Goals Patient Stated Goal: to go home OT Goal Formulation: All assessment and education complete, DC therapy ADL Goals Pt Will Perform Grooming: with modified independence;standing Pt Will Perform Lower Body Bathing: with modified independence;sit to/from stand Pt Will Perform Upper Body Dressing: with modified independence;sitting Pt Will Perform Lower Body Dressing: with modified independence;sit to/from stand Pt Will Transfer to Toilet: with modified independence;ambulating Pt Will Perform Toileting - Clothing Manipulation and hygiene: with modified independence;sit to/from stand Pt Will Perform Tub/Shower Transfer: Tub transfer;with supervision;3 in 1;ambulating  Plan Discharge plan remains appropriate    Co-evaluation                 AM-PAC OT "6 Clicks" Daily Activity     Outcome Measure   Help from another person eating meals?: None Help from another person taking care of personal grooming?: A Little Help from another person toileting, which includes using toliet, bedpan, or urinal?: A Little Help from another person bathing (including washing, rinsing, drying)?: A Little Help from another person to put on and taking off regular upper body clothing?: A Little Help from another person to put on and taking off regular lower body clothing?: A Lot 6 Click Score: 18    End of Session Equipment Utilized During Treatment: Rolling walker;Back brace;Other (comment) (rollator)  OT Visit Diagnosis: Other abnormalities of gait and mobility (R26.89);Pain   Activity Tolerance Patient tolerated  treatment well   Patient Left in bed;with call bell/phone within reach   Nurse Communication Mobility status        Time: 9323-5573 OT Time Calculation (min): 22 min  Charges: OT General Charges $OT Visit: 1 Visit OT Treatments $Self Care/Home Management : 8-22 mins  [place], OTR/L Acute Rehab Pager: [phone] Office: [place] 10/26/2020, 5:02 PM

## 2020-10-27 LAB — BASIC METABOLIC PANEL
Anion gap: 13 (ref 5–15)
BUN: 6 mg/dL (ref 6–20)
CO2: 28 mmol/L (ref 22–32)
Calcium: 8.5 mg/dL — ABNORMAL LOW (ref 8.9–10.3)
Chloride: 99 mmol/L (ref 98–111)
Creatinine, Ser: 0.68 mg/dL (ref 0.44–1.00)
GFR, Estimated: >60 mL/min (ref >60)
Glucose, Bld: 146 mg/dL — ABNORMAL HIGH (ref 70–99)
Potassium: 2.8 mmol/L — ABNORMAL LOW (ref 3.5–5.1)
Sodium: 140 mmol/L (ref 135–145)

## 2020-10-27 LAB — CBC WITH DIFFERENTIAL/PLATELET
Abs Immature Granulocytes: 0.06 10*3/uL (ref 0.00–0.07)
Basophils Absolute: 0 10*3/uL (ref 0.0–0.1)
Basophils Relative: 0 %
Eosinophils Absolute: 0 10*3/uL (ref 0.0–0.5)
Eosinophils Relative: 0 %
HCT: 29.7 % — ABNORMAL LOW (ref 36.0–46.0)
Hemoglobin: 9.8 g/dL — ABNORMAL LOW (ref 12.0–15.0)
Immature Granulocytes: 1 %
Lymphocytes Relative: 21 %
Lymphs Abs: 2 10*3/uL (ref 0.7–4.0)
MCH: 31.9 pg (ref 26.0–34.0)
MCHC: 33 g/dL (ref 30.0–36.0)
MCV: 96.7 fL (ref 80.0–100.0)
Monocytes Absolute: 1.2 10*3/uL — ABNORMAL HIGH (ref 0.1–1.0)
Monocytes Relative: 13 %
Neutro Abs: 6.2 10*3/uL (ref 1.7–7.7)
Neutrophils Relative %: 65 %
Platelets: 164 10*3/uL (ref 150–400)
RBC: 3.07 MIL/uL — ABNORMAL LOW (ref 3.87–5.11)
RDW: 15.8 % — ABNORMAL HIGH (ref 11.5–15.5)
WBC: 9.5 10*3/uL (ref 4.0–10.5)
nRBC: 0 % (ref 0.0–0.2)

## 2020-10-27 LAB — URIC ACID: Uric Acid, Serum: 7.1 mg/dL (ref 2.5–7.1)

## 2020-10-27 LAB — GLUCOSE, CAPILLARY: Glucose-Capillary: 150 mg/dL — ABNORMAL HIGH (ref 70–99)

## 2020-10-27 MED ORDER — OXYCODONE HCL 5 MG PO TABS: 5.0000 mg | ORAL_TABLET | ORAL | 0 refills | Status: DC | PRN

## 2020-10-27 MED ORDER — OXYCODONE HCL ER 10 MG PO T12A: 10.0000 mg | EXTENDED_RELEASE_TABLET | Freq: Two times a day (BID) | ORAL | 0 refills | Status: DC

## 2020-10-27 MED ORDER — METHOCARBAMOL 500 MG PO TABS: 500.0000 mg | ORAL_TABLET | Freq: Four times a day (QID) | ORAL | 1 refills | Status: DC | PRN

## 2020-10-27 MED ORDER — GABAPENTIN 100 MG PO CAPS: 100.0000 mg | ORAL_CAPSULE | Freq: Three times a day (TID) | ORAL | 3 refills | Status: DC

## 2020-10-27 MED ORDER — DOCUSATE SODIUM 100 MG PO CAPS: 100.0000 mg | ORAL_CAPSULE | Freq: Two times a day (BID) | ORAL | 0 refills | Status: DC

## 2020-10-27 NOTE — Progress Notes (Signed)
Pt and Pt's wife given d/c eduction and all questions answered. Equipment delivered to the room prior to d/c. No printed prescription to give. IVs removed. Pt taken to car with all belongings.

## 2020-10-27 NOTE — Progress Notes (Signed)
Rn spoke with Pharmacist from [facility] where Pt's meds are being dispensed. Pharmacist stated that they would not be able to dispense Pt's Oxycotin 10 mg because they do not have it to dispense and because it is schedule 2 it could not be transferred. RN reached out by calling MD and MD's office and was not able to leave a message. RN attempted to message MD through secure chat. Rn explained situation to Pt and Pt stated "I want to go home." RN explained that the Pt would have Oxycodone 5mg  immediate release pain medication to take in the mean time and could call Dr. office on Monday and have them redispense the correct medication if needed. Pt and Pt's husband are in agreeance. RN will call Pharmacist and update and d/c Pt.

## 2020-10-27 NOTE — TOC Transition Note (Signed)
Transition of Care (TOC) - CM/SW Discharge Note Donn Pierini RN,BSN Transitions of Care Unit 4NP (non trauma) - RN Case Manager See Treatment Team for direct Phone #   Patient Details  Name: Sydney Williams MRN: [id number] Date of Birth: [birth year]  Transition of Care [facility]) CM/SW Contact:  Darrold Span, RN Phone Number: 10/27/2020, 10:16 AM   Clinical Narrative:    Pt stable for transition home today, DME has been ordered and delivered per [employer], recs for outpt PT- as pt has no insurance will use [facility] outpt Rehab- pt agreeable. Referral made to [facility] outpt rehab on [address] via epic. Pt to return home with family.  Meds sent to [facility].   Final next level of care: OP Rehab Barriers to Discharge: No Barriers Identified   Patient Goals and CMS Choice Patient states their goals for this hospitalization and ongoing recovery are:: return home   Choice offered to / list presented to : NA  Discharge Placement               Home        Discharge Plan and Services   Discharge Planning Services: CM Consult Post Acute Care Choice: Durable Medical Equipment          DME Arranged: 3-N-1,Walker rolling with seat DME Agency: [employer] Date DME Agency Contacted: 10/26/20 Time DME Agency Contacted: 1030 Representative spoke with at DME Agency: Velna Hatchet HH Arranged: NA HH Agency: NA        Social Determinants of Health (SDOH) Interventions     Readmission Risk Interventions Readmission Risk Prevention Plan 10/27/2020  Post Dischage Appt Complete  Medication Screening Complete  Transportation Screening Complete  Some recent data might be hidden

## 2020-10-27 NOTE — Progress Notes (Signed)
     Subjective: 3 Days Post-Op Procedure(s) (LRB): LEFT LUMBAR TWO-THREE, LUMBAR THREE-FOUR AND LUMBAR FOUR- FIVE TRANSFORAMINAL LUMBAR INTERBODY FUSIONS WITH PEDICLE SCREWS, RODS, THREE CAGES, LOCAL BONE GRAFT, ALLOGRAFT BONE GRAFT AND VIVIGEN (N/A) Awake, alert and oriented x 4, legs are Nv normal, motor is normal. Incision is healing no erythrema, no drainage today.   Patient reports pain as moderate.    Objective:   VITALS:  Temp:  [99.7 F (37.6 C)-102.5 F (39.2 C)] 100.1 F (37.8 C) (12/31 0700) Pulse Rate:  [96-101] 101 (12/31 0700) Resp:  [18-19] 18 (12/31 0335) BP: (114-138)/(64-81) 138/79 (12/31 0700) SpO2:  [94 %-98 %] 94 % (12/31 0335)  Neurologically intact ABD soft Neurovascular intact Sensation intact distally Intact pulses distally Dorsiflexion/Plantar flexion intact Incision: dressing C/D/I and no drainage No cellulitis present Compartment soft   LABS Recent Labs    10/25/20 0258 10/26/20 0303 10/27/20 0420  HGB 10.7* 9.6* 9.8*  WBC 11.3* 8.9 9.5  PLT 168 151 164   Recent Labs    10/26/20 0303 10/27/20 0420  NA 140 140  K 3.2* 2.8*  CL 103 99  CO2 27 28  BUN 7 6  CREATININE 0.61 0.68  GLUCOSE 115* 146*   No results for input(s): LABPT, INR in the last 72 hours.   Assessment/Plan: 3 Days Post-Op Procedure(s) (LRB): LEFT LUMBAR TWO-THREE, LUMBAR THREE-FOUR AND LUMBAR FOUR- FIVE TRANSFORAMINAL LUMBAR INTERBODY FUSIONS WITH PEDICLE SCREWS, RODS, THREE CAGES, LOCAL BONE GRAFT, ALLOGRAFT BONE GRAFT AND VIVIGEN (N/A)  Advance diet Up with therapy Discharge home with home health  Vira Browns 10/27/2020, 9:11 AMPatient ID: Sydney Williams, female   DOB: [birth year], 54 y.o.   MRN: [id number]

## 2020-10-30 ENCOUNTER — Other Ambulatory Visit: Payer: Self-pay | Admitting: Critical Care Medicine

## 2020-10-30 ENCOUNTER — Telehealth: Payer: Self-pay | Admitting: Specialist

## 2020-10-30 ENCOUNTER — Other Ambulatory Visit: Payer: Self-pay | Admitting: Specialist

## 2020-10-30 DIAGNOSIS — I10 Essential (primary) hypertension: Secondary | ICD-10-CM

## 2020-10-30 MED ORDER — OXYCODONE HCL ER 10 MG PO T12A
10.0000 mg | EXTENDED_RELEASE_TABLET | Freq: Two times a day (BID) | ORAL | 0 refills | Status: AC
Start: 2020-10-30 — End: 2020-11-06

## 2020-10-30 MED FILL — Heparin Sodium (Porcine) Inj 1000 Unit/ML: INTRAMUSCULAR | Qty: 30 | Status: AC

## 2020-10-30 MED FILL — Sodium Chloride IV Soln 0.9%: INTRAVENOUS | Qty: 1000 | Status: AC

## 2020-10-30 MED FILL — ?METOPROLOL TART 50 MG TABL: 50 | 30 days supply | Qty: 60 | Fill #0

## 2020-10-30 MED FILL — ?ALLOPURINOL 100MG TABLET: 100 | 30 days supply | Qty: 30 | Fill #6

## 2020-10-30 MED FILL — ?ATORVASTATIN 40MG TABLET: 40 | 30 days supply | Qty: 30 | Fill #1

## 2020-10-30 MED FILL — HYDROCHLOROTHIAZIDE 25 MG T: 25 | 30 days supply | Qty: 30 | Fill #6

## 2020-10-30 NOTE — Telephone Encounter (Signed)
The clinic is closed today, routing to [place] to handle in the am.

## 2020-10-30 NOTE — Telephone Encounter (Signed)
Pt called and said when she went to [facility] on [address] to pick her prescriptions, they informed her that they do not have one of her medications which was Oxycontin. Please give her a call at [phone]

## 2020-10-30 NOTE — Telephone Encounter (Signed)
Pt returned call to [facility] but no one answered at [facility] when I tried to transfer  CB#  [phone]

## 2020-10-30 NOTE — Telephone Encounter (Signed)
Meds sent to [facility], lmom advising patient

## 2020-10-31 ENCOUNTER — Other Ambulatory Visit: Payer: Self-pay

## 2020-10-31 ENCOUNTER — Ambulatory Visit: Payer: Self-pay | Attending: Specialist | Admitting: Physical Therapy

## 2020-10-31 ENCOUNTER — Telehealth: Payer: Self-pay

## 2020-10-31 ENCOUNTER — Encounter: Payer: Self-pay | Admitting: Physical Therapy

## 2020-10-31 DIAGNOSIS — G8929 Other chronic pain: Secondary | ICD-10-CM | POA: Insufficient documentation

## 2020-10-31 DIAGNOSIS — M6281 Muscle weakness (generalized): Secondary | ICD-10-CM | POA: Insufficient documentation

## 2020-10-31 DIAGNOSIS — R2689 Other abnormalities of gait and mobility: Secondary | ICD-10-CM | POA: Insufficient documentation

## 2020-10-31 DIAGNOSIS — M545 Low back pain, unspecified: Secondary | ICD-10-CM | POA: Insufficient documentation

## 2020-10-31 NOTE — Therapy (Signed)
[facility] [address] [place], [place], [address] Phone: [phone]   Fax:  [phone]  Physical Therapy Evaluation  Patient Details  Name: Sydney Williams MRN: [id number] Date of Birth: 18-Jul-1966 Referring Provider (PT): Kerrin Champagne, MD   Encounter Date: 10/31/2020   PT End of Session - 10/31/20 1103    Visit Number 1    Number of Visits 13    Date for PT Re-Evaluation 12/12/20    Authorization Type CAFA    PT Start Time 0825    PT Stop Time 0910    PT Time Calculation (min) 45 min    Equipment Utilized During Treatment Back brace   TLSO   Activity Tolerance Patient tolerated treatment well    Behavior During Therapy [facility] for tasks assessed/performed           Past Medical History:  Diagnosis Date  . Asthma    2015 hospitaliation for asthma  . Diabetes mellitus    Type II  . Family history of breast cancer   . Fibroids 2010  . Genital herpes   . Genital herpes 11/30/2014  . GERD (gastroesophageal reflux disease)   . Hyperlipidemia   . Hypertension   . Impaired fasting blood sugar   . Migraines   . Obesity   . Right carpal tunnel syndrome   . Smoker     Past Surgical History:  Procedure Laterality Date  . CARDIAC CATHETERIZATION    . CARPAL TUNNEL RELEASE Right 08/30/2020   Procedure: RIGHT CARPAL TUNNEL RELEASE;  Surgeon: Tarry Kos, MD;  Location: [facility];  Service: Orthopedics;  Laterality: Right;  . COLONOSCOPY  03/13/2016   Dr. Leone Payor, normal, repeat 2027  . CYST EXCISION     left neck/postauricular region, benign  . DILATION AND CURETTAGE OF UTERUS    . LAPAROSCOPIC ABDOMINAL EXPLORATION     removal of ectopic preg  . LEFT HEART CATH AND CORONARY ANGIOGRAPHY N/A 06/23/2017   Procedure: LEFT HEART CATH AND CORONARY ANGIOGRAPHY;  Surgeon: Yvonne Kendall, MD;  [facility];  Service: Cardiovascular;  Laterality: N/A;  . TUBAL LIGATION      There were no  vitals filed for this visit.    Subjective Assessment - 10/31/20 1058    Subjective Patient reports she had lumbar fusion on12/28/2021 due to having back pain and difficulty walking or standing extended periods. Currently she reports both her thighs ache and burn when she is moving or standing. Also notes her back has gotten really tight and painful the past 3 days when she is up standing or walking. Patient is having trouble sleeping because pain and difficulty transitioning from sidelying to lying on her back. She has been compliant with TLSO but has had trouble getting it to fit right.    Limitations Lifting;Standing;Walking;House hold activities    How long can you sit comfortably? No limitation    How long can you stand comfortably? < 5 minutes    How long can you walk comfortably? < 5 minutes    Patient Stated Goals Get back to walking and standing longer periods without pain    Currently in Pain? Yes    Pain Score 8     Pain Location Leg   thighs   Pain Orientation Right;Left;Anterior    Pain Descriptors / Indicators Aching;Burning    Pain Type Surgical pain    Pain Onset 1 to 4 weeks ago    Pain Frequency  Intermittent    Aggravating Factors  Moving, standing, walking    Pain Relieving Factors Rest, medication    Multiple Pain Sites Yes    Pain Score 5    Pain Location Back    Pain Orientation Lower    Pain Descriptors / Indicators Tightness    Pain Type Surgical pain    Pain Onset 1 to 4 weeks ago    Pain Frequency Intermittent    Aggravating Factors  Standing, walking    Pain Relieving Factors Rest, medication              [facility] PT Assessment - 10/31/20 0001      Assessment   Medical Diagnosis s/p L2-5 Fusion    Referring Provider (PT) Kerrin ChampagneNitka, James E, MD    Onset Date/Surgical Date 10/24/20    Next MD Visit 11/08/2020    Prior Therapy Yes - hospital post-op      Precautions   Precautions Back    Precaution Comments No bending, lifting, twisting    Required  Braces or Orthoses Spinal Brace   TLSO     Restrictions   Weight Bearing Restrictions No      Balance Screen   Has the patient fallen in the past 6 months No    Has the patient had a decrease in activity level because of a fear of falling?  No    Is the patient reluctant to leave their home because of a fear of falling?  No      Prior Function   Level of Independence Independent with household mobility with device;Independent with community mobility with device    Vocation On disability    Leisure None reported      Cognition   Overall Cognitive Status Within Functional Limits for tasks assessed      Observation/Other Assessments   Observations Patient appears in no apparent distress    Focus on Therapeutic Outcomes (FOTO)  19% functional status      Sensation   Light Touch Appears Intact    Additional Comments Hyperalgesia to light touch grossly over anterior thigh region, no specific dermatomal pattern      Functional Tests   Functional tests Sit to Stand      Sit to Stand   Comments Able to perform with BUE support on thighs, decreased control, fatigues quickly      Posture/Postural Control   Posture Comments Patient with rounded shoulder posture, TLSO brace donned      ROM / Strength   AROM / PROM / Strength PROM;Strength      PROM   Overall PROM Comments [facility]      Strength   Strength Assessment Site Hip;Knee;Ankle    Right/Left Hip Right;Left    Right Hip Flexion 4-/5    Right Hip Extension 3+/5    Right Hip ABduction 3+/5    Left Hip Flexion 4-/5    Left Hip Extension 3+/5    Left Hip ABduction 3+/5    Right/Left Knee Right;Left    Right Knee Flexion 4/5    Right Knee Extension 4/5    Left Knee Flexion 4/5    Left Knee Extension 4/5    Right/Left Ankle Right;Left    Right Ankle Dorsiflexion 4+/5    Right Ankle Plantar Flexion 4-/5    Right Ankle Inversion 4+/5    Right Ankle Eversion 4+/5    Left Ankle Dorsiflexion 4+/5    Left Ankle  Plantar Flexion 4-/5  Left Ankle Inversion 4+/5    Left Ankle Eversion 4+/5      Flexibility   Soft Tissue Assessment /Muscle Length yes    Hamstrings Limited bilaterally      Palpation   Palpation comment Hyperalgesia to light touch grossly over anterior thigh region, no specific dermatomal pattern; TTP lower lumbar paraspinals      Bed Mobility   Bed Mobility --   Extra time and cueing required for all bed mobility, cueing provided for log roll technique     Ambulation/Gait   Ambulation/Gait Yes    Ambulation/Gait Assistance 6: Modified independent (Device/Increase time)    Assistive device Rollator    Gait Comments Decreased gait speed, forward trunk lean, occasional shuffling of gait                      Objective measurements completed on examination: See above findings.       [facility] Adult PT Treatment/Exercise - 10/31/20 0001      Exercises   Exercises Lumbar      Lumbar Exercises: Standing   Heel Raises 20 reps      Lumbar Exercises: Seated   Long Arc Quad on Chair 20 reps    Sit to Stand 5 reps   hands on thighs for support     Lumbar Exercises: Supine   Glut Set 10 reps;5 seconds   hooklying   Clam 20 reps   yellow band   Bent Knee Raise 20 reps    Other Supine Lumbar Exercises Bent knee fall out x 10                  PT Education - 10/31/20 1055    Education Details Exam findings, POC, HEP, brace fit and wear    Person(s) Educated Patient    Methods Explanation;Demonstration;Tactile cues;Verbal cues;Handout    Comprehension Verbalized understanding;Returned demonstration;Verbal cues required;Tactile cues required;Need further instruction            PT Short Term Goals - 10/31/20 1114      PT SHORT TERM GOAL #1   Title Patient will be I with initial HEP to progress with PT    Time 3    Period Weeks    Status New    Target Date 11/21/20      PT SHORT TERM GOAL #2   Title PT with review HEP with patient by 3rd visit     Time 3    Period Weeks    Status New    Target Date 11/21/20      PT SHORT TERM GOAL #3   Title Patient will be able to ambulate household distances without AD to improve independence    Time 3    Period Weeks    Status New    Target Date 11/21/20             PT Long Term Goals - 10/31/20 1116      PT LONG TERM GOAL #1   Title Patient will be I with final HEP to maintain progress from PT    Time 6    Period Weeks    Status New    Target Date 12/12/20      PT LONG TERM GOAL #2   Title Patient will be able to ambulate community level distances with LRAD to be able to grocery shop    Time 6    Period Weeks    Status New    Target  Date 12/12/20      PT LONG TERM GOAL #3   Title Patient will demonstrate hip strength >/= 4/5 MMT and knee/ankle strength 5/5 MMT to improve walking and transfers    Time 6    Period Weeks    Status New    Target Date 12/12/20      PT LONG TERM GOAL #4   Title Patient will report </= 4/10 pain level with activity to reduce functional limitation    Time 6    Period Weeks    Status New    Target Date 12/12/20      PT LONG TERM GOAL #5   Title Patient will report improved functional status >/= 44% on FOTO    Time 6    Period Weeks    Status New    Target Date 12/12/20                  Plan - 10/31/20 1104    Clinical Impression Statement Patient presents to PT s/p L2-5 fusion on 10/24/20. She is currently wearing TLSO brace for all mobility and using rollator for ambulation due to pain and weakness. Patient exhibits gross bilateral LE weakness with increased bilateral anterior thigh pain and lumbar tightness with weightbearing activity. She was instructed on properly donning TLSO brace for improve support and she was provided exercises in supine, seated, and standing position to initiate strengthening. Patient would benefit from continued skilled PT to progress her strength and mobility in order to improve walking and maximize  functional ability.    Personal Factors and Comorbidities Fitness;Comorbidity 3+;Past/Current Experience;Social Background;Finances;Transportation    Comorbidities BMI, DM II, tobacco use, depression    Examination-Activity Limitations Locomotion Level;Transfers;Bed Mobility;Bend;Sleep;Carry;Dressing;Hygiene/Grooming;Lift;Stand;Stairs;Squat;Bathing    Examination-Participation Restrictions Meal Prep;Cleaning;Occupation;Community Activity;Driving;Shop;Laundry;Yard Work    [employer] Evolving/Moderate complexity    Clinical Decision Making Moderate    Rehab Potential Good    PT Frequency 2x / week    PT Duration 6 weeks    PT Treatment/Interventions ADLs/Self Care Home Management;Cryotherapy;Electrical Stimulation;Iontophoresis 4mg /ml Dexamethasone;Moist Heat;Ultrasound;Neuromuscular re-education;Balance training;Therapeutic exercise;Therapeutic activities;Functional mobility training;Stair training;Gait training;Patient/family education;Manual techniques;Dry needling;Passive range of motion;Taping;Spinal Manipulations;Joint Manipulations    PT Next Visit Plan Review HEP and progress PRN, ensure proper TLSO fit, progress core and LE strengthening abiding by precautions, hip and LE stretching    PT Home Exercise Plan [email or web address]: supine march, bent knee fall out, clam with yellow, glute sets, LAQ, sit<>stand, standing heel raises    Consulted and Agree with Plan of Care Patient           Patient will benefit from skilled therapeutic intervention in order to improve the following deficits and impairments:  Abnormal gait,Decreased range of motion,Difficulty walking,Decreased activity tolerance,Pain,Decreased balance,Impaired flexibility,Improper body mechanics,Postural dysfunction,Decreased strength  Visit Diagnosis: Chronic bilateral low back pain, unspecified whether sciatica present  Muscle weakness (generalized)  Other abnormalities of gait and mobility     Problem  List Patient Active Problem List   Diagnosis Date Noted  . History of lumbar spinal fusion 10/24/2020  . Seasonal and perennial allergic rhinoconjunctivitis 09/18/2020  . Carpal tunnel syndrome on right   . Seronegative rheumatoid arthritis ([place]) 08/04/2020  . Allergic conjunctivitis of both eyes 07/19/2020  . Lumbar radiculopathy, chronic 04/03/2020  . Elevated blood uric acid level 04/03/2020  . Family history of cervical cancer 02/02/2018  . Family history of breast cancer 02/02/2018  . Vitamin D deficiency 02/02/2018  . Depression 06/19/2017  . Hypomagnesemia 06/19/2017  . Hypersensitivity to pneumococcal  vaccine 01/08/2017  . Snoring 09/11/2016  . History of migraine 09/11/2016  . Frequent headaches 09/11/2016  . Moderate persistent asthma 06/01/2015  . Gastroesophageal reflux disease without esophagitis 06/01/2015  . Other allergic rhinitis 06/01/2015  . Tobacco user 11/30/2014  . Essential hypertension 11/30/2014  . Hyperlipidemia 11/30/2014  . Obesity 07/02/2012  . Type 2 diabetes mellitus without complication, without long-term current use of insulin (HCC) 07/02/2012    Rosana Hoes, PT, DPT, LAT, ATC 10/31/20  11:34 AM Phone: [phone] Fax: [phone]   [facility] [address] [place], [place], [address] Phone: [phone]   Fax:  [phone]  Name: ANSLEIGH SAFER MRN: [id number] Date of Birth: [birth year]

## 2020-10-31 NOTE — Patient Instructions (Signed)
Access Code: [email or web address] URL: [email or web address] Date: 10/31/2020 Prepared by: Rosana Hoes  Exercises Supine March - 3 x daily - 5 x weekly - 20 reps Bent Knee Fallouts - 3 x daily - 5 x weekly - 10 reps Hooklying Clamshell with Resistance - 3 x daily - 5 x weekly - 20 reps Hooklying Gluteal Sets - 3 x daily - 5 x weekly - 10 reps - 5 seconds hold Seated Long Arc Quad - 3 x daily - 5 x weekly - 20 reps Sit to Stand - 3 x daily - 5 x weekly - 5 reps Heel rises with counter support - 3 x daily - 5 x weekly - 20 reps

## 2020-10-31 NOTE — Telephone Encounter (Signed)
Transition Care Management Follow-up Telephone Call  Date of discharge and from where: 10/27/2020, [facility]   How have you been since you were released from the hospital? She said she is a little sore, She just completed her first outpatient PT session.   Any questions or concerns? No   She has been working with [employer] to obtain CFA.   Items Reviewed:  Did the pt receive and understand the discharge instructions provided? Yes   Medications obtained and verified? Yes she said she has all of her medications and did not have any questions about her med regime. Gabapentin is listed twice on her AVS.  She said she was instructed by the hospital staff to take 100 mg three times daily. Vitamin D is also llisted twice and she and she takes 1000 UT daily.   Other? No   Any new allergies since your discharge? No   Do you have support at home? Yes   Home Care and Equipment/Supplies: Were home health services ordered? No. She was referred for outpatient therapy.  If so, what is the name of the agency? n/a Has the agency set up a time to come to the patient's home?  n/a Were any new equipment or medical supplies ordered?  Yes: 3:1 and rollator What is the name of the medical supply agency? [employer]  Were you able to get the supplies/equipment?  yes Do you have any questions related to the use of the equipment or supplies? No  Functional Questionnaire: (I = Independent and D = Dependent) ADLs: independent. She has been wearing her back brace and using the rollator.   Follow up appointments reviewed:   PCP Hospital f/u appt confirmed? Yes  - scheduled to see Dr Delford Field, 12/11/2020.    Specialist Hospital f/u appt confirmed? Yes , orthopedic surgery 11/08/2020.   Are transportation arrangements needed? No   If their condition worsens, is the pt aware to call PCP or go to the Emergency Dept.? yes  Was the patient provided with contact  information for the PCP's office or ED?   She has the phone number for [facility]  Was to pt encouraged to call back with questions or concerns?   yes

## 2020-11-02 ENCOUNTER — Encounter (HOSPITAL_COMMUNITY): Payer: Self-pay | Admitting: Specialist

## 2020-11-02 DIAGNOSIS — M5136 Other intervertebral disc degeneration, lumbar region: Secondary | ICD-10-CM

## 2020-11-03 ENCOUNTER — Ambulatory Visit: Payer: Self-pay | Admitting: Physical Therapy

## 2020-11-03 ENCOUNTER — Other Ambulatory Visit: Payer: Self-pay

## 2020-11-08 ENCOUNTER — Encounter: Payer: Self-pay | Admitting: Specialist

## 2020-11-08 ENCOUNTER — Ambulatory Visit (INDEPENDENT_AMBULATORY_CARE_PROVIDER_SITE_OTHER): Payer: Self-pay

## 2020-11-08 ENCOUNTER — Other Ambulatory Visit: Payer: Self-pay

## 2020-11-08 ENCOUNTER — Ambulatory Visit (INDEPENDENT_AMBULATORY_CARE_PROVIDER_SITE_OTHER): Payer: Self-pay | Admitting: Specialist

## 2020-11-08 VITALS — BP 118/80 | HR 75 | Ht 71.0 in | Wt 283.0 lb

## 2020-11-08 DIAGNOSIS — Z981 Arthrodesis status: Secondary | ICD-10-CM

## 2020-11-08 MED ORDER — OXYCODONE-ACETAMINOPHEN 5-325 MG PO TABS: 1.0000 | ORAL_TABLET | ORAL | 0 refills | Status: DC | PRN

## 2020-11-08 NOTE — Progress Notes (Signed)
Post-Op Visit Note   Patient: Sydney Williams           Date of Birth: 08-28-1966           MRN: [id number] Visit Date: 11/08/2020 PCP: Elsie Stain, MD   Assessment & Plan: 2 weeks post L2-5 TLIF, 3 level fusion.  Chief Complaint:  Chief Complaint  Patient presents with  . Lower Back - Routine Post Op   Visit Diagnoses:  1. S/P lumbar fusion   C/O numbness both thighs to the knees, walking in the hallways. Hemorrhoids are actually. Legs are NV normal Sensation upper lumbar bilateral decreased. Radiographs of the lumbar spine  Plan:   Call if there is increasing drainage, fever greater than 101.5, severe head aches, and worsening nausea or light sensitivity. If shortness of breath, bloody cough or chest tightness or pain go to an emergency room. No lifting greater than 10 lbs. Avoid bending, stooping and twisting. Use brace when sitting and out of bed even to go to bathroom. Walk in house for first 2 weeks then may start to get out slowly increasing distances up to one mile by 4-6 weeks post op. May shower and change dressing following bathing with shower.When bathing remove the brace shower and replace brace before getting out of the shower.     Follow-Up Instructions: No follow-ups on file.   Orders:  Orders Placed This Encounter  Procedures  . XR Lumbar Spine 2-3 Views   No orders of the defined types were placed in this encounter.   Imaging: No results found.  PMFS History: Patient Active Problem List   Diagnosis Date Noted  . Degenerative disc disease, lumbar   . History of lumbar spinal fusion 10/24/2020  . Seasonal and perennial allergic rhinoconjunctivitis 09/18/2020  . Carpal tunnel syndrome on right   . Seronegative rheumatoid arthritis ([place]) 08/04/2020  . Allergic conjunctivitis of both eyes 07/19/2020  . Lumbar radiculopathy, chronic 04/03/2020  . Elevated blood uric acid level 04/03/2020  . Family history of cervical cancer 02/02/2018   . Family history of breast cancer 02/02/2018  . Vitamin D deficiency 02/02/2018  . Depression 06/19/2017  . Hypomagnesemia 06/19/2017  . 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 Snoring 09/11/2016  . History of migraine 09/11/2016  . Frequent headaches 09/11/2016  . Moderate persistent asthma 06/01/2015  . Gastroesophageal reflux disease without esophagitis 06/01/2015  . Other allergic rhinitis 06/01/2015  . Tobacco user 11/30/2014  . Essential hypertension 11/30/2014  . Hyperlipidemia 11/30/2014  . Obesity 07/02/2012  . Type 2 diabetes mellitus without complication, without long-term current use of insulin ([place]) 07/02/2012   Past Medical History:  Diagnosis Date  . Asthma    2015 hospitaliation for asthma  . Diabetes mellitus    Type II  . Family history of breast cancer   . Fibroids 2010  . Genital herpes   . Genital herpes 11/30/2014  . GERD (gastroesophageal reflux disease)   . Hyperlipidemia   . Hypertension   . Impaired fasting blood sugar   . Migraines   . Obesity   . Right carpal tunnel syndrome   . Smoker     Family History  Problem Relation Age of Onset  . Diabetes Mother   . Hypertension Mother   . Aneurysm Mother   . Stroke Mother   . Cancer Mother        cervical cancer  . Cancer Maternal Aunt        breast  . Cancer  Cousin        breast/breast  . Lupus Cousin   . Heart disease Neg Hx   . Colon cancer Neg Hx   . Allergic rhinitis Neg Hx   . Angioedema Neg Hx   . Asthma Neg Hx   . Atopy Neg Hx   . Eczema Neg Hx   . Immunodeficiency Neg Hx   . Urticaria Neg Hx     Past Surgical History:  Procedure Laterality Date  . CARDIAC CATHETERIZATION    . CARPAL TUNNEL RELEASE Right 08/30/2020   Procedure: RIGHT CARPAL TUNNEL RELEASE;  Surgeon: Leandrew Koyanagi, MD;  Location: [facility];  Service: Orthopedics;  Laterality: Right;  . COLONOSCOPY  03/13/2016   Dr. Carlean Purl, normal, repeat 2027  . CYST EXCISION     left  neck/postauricular region, benign  . DILATION AND CURETTAGE OF UTERUS    . LAPAROSCOPIC ABDOMINAL EXPLORATION     removal of ectopic preg  . LEFT HEART CATH AND CORONARY ANGIOGRAPHY N/A 06/23/2017   Procedure: LEFT HEART CATH AND CORONARY ANGIOGRAPHY;  Surgeon: Nelva Bush, MD;  Location: [facility];  Service: Cardiovascular;  Laterality: N/A;  . TUBAL LIGATION     Social History   Occupational History  . Not on file  Tobacco Use  . Smoking status: Current Every Day Smoker    Packs/day: 0.25    Years: 14.00    Pack years: 3.50    Types: Cigarettes  . Smokeless tobacco: Never Used  Vaping Use  . Vaping Use: Never used  Substance and Sexual Activity  . Alcohol use: Yes    Comment: occ  . Drug use: No  . Sexual activity: Yes    Birth control/protection: Surgical

## 2020-11-08 NOTE — Patient Instructions (Signed)
Call if there is increasing drainage, fever greater than 101.5, severe head aches, and worsening nausea or light sensitivity. If shortness of breath, bloody cough or chest tightness or pain go to an emergency room. No lifting greater than 10 lbs. Avoid bending, stooping and twisting. Use brace when sitting and out of bed even to go to bathroom. Walk in house for first 2 weeks then may start to get out slowly increasing distances up to one mile by 4-6 weeks post op. May shower and change dressing following bathing with shower.When bathing remove the brace shower and replace brace before getting out of the shower. 

## 2020-11-09 MED FILL — OMEPRAZOLE DR 40 MG CAPSULE: 40 | 30 days supply | Qty: 60 | Fill #1

## 2020-11-09 MED FILL — AMLODIPINE BESYLATE 10 MG T: 10 | 30 days supply | Qty: 30 | Fill #0

## 2020-11-10 ENCOUNTER — Ambulatory Visit: Payer: Medicaid Other

## 2020-11-13 ENCOUNTER — Ambulatory Visit: Payer: Self-pay | Admitting: Physical Therapy

## 2020-11-14 NOTE — Discharge Summary (Signed)
Patient ID: Sydney Williams MRN: [id number] DOB/AGE: [birth year] 55 y.o.  Admit date: 10/24/2020 Discharge date: 11/14/2020  Admission Diagnoses:  Active Problems:   History of lumbar spinal fusion   Degenerative disc disease, lumbar   Discharge Diagnoses:  Active Problems:   History of lumbar spinal fusion   Degenerative disc disease, lumbar  status post Procedure(s): LEFT LUMBAR TWO-THREE, LUMBAR THREE-FOUR AND LUMBAR FOUR- FIVE TRANSFORAMINAL LUMBAR INTERBODY FUSIONS WITH PEDICLE SCREWS, RODS, THREE CAGES, LOCAL BONE GRAFT, ALLOGRAFT BONE GRAFT AND VIVIGEN  Past Medical History:  Diagnosis Date  . Asthma    2015 hospitaliation for asthma  . Diabetes mellitus    Type II  . Family history of breast cancer   . Fibroids 2010  . Genital herpes   . Genital herpes 11/30/2014  . GERD (gastroesophageal reflux disease)   . Hyperlipidemia   . Hypertension   . Impaired fasting blood sugar   . Migraines   . Obesity   . Right carpal tunnel syndrome   . Smoker     Surgeries: Procedure(s): LEFT LUMBAR TWO-THREE, LUMBAR THREE-FOUR AND LUMBAR FOUR- FIVE TRANSFORAMINAL LUMBAR INTERBODY FUSIONS WITH PEDICLE SCREWS, RODS, THREE CAGES, LOCAL BONE GRAFT, ALLOGRAFT BONE GRAFT AND VIVIGEN on 10/24/2020   Consultants:   Discharged Condition: Improved  Hospital Course: Sydney Williams is an 55 y.o. female who was admitted 10/24/2020 for operative treatment of lumbar stenosis. Patient failed conservative treatments (please see the history and physical for the specifics) and had severe unremitting pain that affects sleep, daily activities and work/hobbies. After pre-op clearance, the patient was taken to the operating room on 10/24/2020 and underwent  Procedure(s): LEFT LUMBAR TWO-THREE, LUMBAR THREE-FOUR AND LUMBAR FOUR- FIVE TRANSFORAMINAL LUMBAR INTERBODY FUSIONS WITH PEDICLE SCREWS, RODS, THREE CAGES, LOCAL BONE GRAFT, ALLOGRAFT BONE GRAFT AND VIVIGEN.    Patient was given perioperative  antibiotics:  Anti-infectives (From admission, onward)   Start     Dose/Rate Route Frequency Ordered Stop   10/24/20 2200  acyclovir (ZOVIRAX) 200 MG capsule 200 mg  Status:  Discontinued        200 mg Oral 2 times daily 10/24/20 1811 10/27/20 1637   10/24/20 1900  ceFAZolin (ANCEF) IVPB 2g/100 mL premix        2 g 200 mL/hr over 30 Minutes Intravenous Every 8 hours 10/24/20 1811 10/25/20 0339   10/24/20 1446  vancomycin (VANCOCIN) powder  Status:  Discontinued          As needed 10/24/20 1447 10/24/20 1528   10/24/20 0600  ceFAZolin (ANCEF) 3 g in dextrose 5 % 50 mL IVPB        3 g 100 mL/hr over 30 Minutes Intravenous On call to O.R. 10/23/20 0646 10/24/20 1217       Patient was given sequential compression devices and early ambulation to prevent DVT.   Patient benefited maximally from hospital stay and there were no complications. At the time of discharge, the patient was urinating/moving their bowels without difficulty, tolerating a regular diet, pain is controlled with oral pain medications and they have been cleared by PT/OT.   Recent vital signs: No data found.   Recent laboratory studies: No results for input(s): WBC, HGB, HCT, PLT, NA, K, CL, CO2, BUN, CREATININE, GLUCOSE, INR, CALCIUM in the last 72 hours.  Invalid input(s): PT, 2   Discharge Medications:   Allergies as of 10/27/2020      Reactions   Lisinopril Swelling   Potassium Chloride Shortness Of Breath, Swelling  Tolerates IV KCl, reaction only to PO product   Pneumococcal Vaccines Swelling, Other (See Comments)   Reaction:  Swelling at injection site   Vicodin [hydrocodone-acetaminophen] Itching, Rash      Medication List    STOP taking these medications   ibuprofen 600 MG tablet Commonly known as: ADVIL   oxyCODONE-acetaminophen 5-325 MG tablet Commonly known as: Percocet     TAKE these medications   acetaminophen 500 MG tablet Commonly known as: TYLENOL Take 1,000 mg by mouth every 6 (six)  hours as needed for moderate pain.   acyclovir 200 MG capsule Commonly known as: ZOVIRAX Take 1 capsule (200 mg total) by mouth 2 (two) times daily.   albuterol 108 (90 Base) MCG/ACT inhaler Commonly known as: VENTOLIN HFA INHALE 1-2 PUFFS INTO THE LUNGS EVERY 6 (SIX) HOURS AS NEEDED FOR WHEEZING OR SHORTNESS OF BREATH.   allopurinol 100 MG tablet Commonly known as: ZYLOPRIM Take 100 mg by mouth daily.   ascorbic acid 500 MG tablet Commonly known as: VITAMIN C Take 500 mg by mouth daily.   aspirin 81 MG EC tablet Commonly known as: EQ Aspirin Adult Low Dose Take 1 tablet (81 mg total) by mouth daily. Swallow whole.   atorvastatin 40 MG tablet Commonly known as: LIPITOR TAKE 1 TABLET (40 MG TOTAL) BY MOUTH DAILY.   baclofen 10 MG tablet Commonly known as: LIORESAL TAKE 1-2 TABLETS (10-20 MG TOTAL) BY MOUTH 3 (THREE) TIMES DAILY AS NEEDED FOR MUSCLE SPASMS.   diphenhydrAMINE 25 MG tablet Commonly known as: BENADRYL Take 1 tablet (25 mg total) by mouth every 6 (six) hours as needed. What changed: reasons to take this   docusate sodium 100 MG capsule Commonly known as: COLACE Take 1 capsule (100 mg total) by mouth 2 (two) times daily.   furosemide 20 MG tablet Commonly known as: LASIX One pill once per day if needed for swelling What changed:   how much to take  how to take this  when to take this  reasons to take this  additional instructions   gabapentin 100 MG capsule Commonly known as: NEURONTIN 1 in the morning, 1 at night What changed:   how much to take  how to take this  when to take this  additional instructions   gabapentin 100 MG capsule Commonly known as: NEURONTIN Take 1 capsule (100 mg total) by mouth 3 (three) times daily. What changed: You were already taking a medication with the same name, and this prescription was added. Make sure you understand how and when to take each.   hydrochlorothiazide 25 MG tablet Commonly known as:  HYDRODIURIL Take 1 tablet (25 mg total) by mouth daily. To lower blood pressure   metFORMIN 500 MG tablet Commonly known as: GLUCOPHAGE Take 1 tablet (500 mg total) by mouth 2 (two) times daily with a meal.   methocarbamol 500 MG tablet Commonly known as: ROBAXIN Take 1 tablet (500 mg total) by mouth every 6 (six) hours as needed for muscle spasms.   omeprazole 40 MG capsule Commonly known as: PRILOSEC TAKE 1 CAPSULE (40 MG TOTAL) BY MOUTH 2 (TWO) TIMES DAILY. TO DECREASE STOMACH ACID   oxyCODONE 5 MG immediate release tablet Commonly known as: Oxy IR/ROXICODONE Take 1 tablet (5 mg total) by mouth every 4 (four) hours as needed for moderate pain or breakthrough pain ((score 4 to 6)).   Trelegy Ellipta 200-62.5-25 MCG/INH Aepb Generic drug: Fluticasone-Umeclidin-Vilant Inhale 1 puff into the lungs daily. Rinse mouth after each  use.   Vitamin D 50 MCG (2000 UT) tablet Take 2,000 Units by mouth daily.   Vitamin D (Cholecalciferol) 25 MCG (1000 UT) Tabs Take 1 tablet by mouth once daily       Diagnostic Studies: DG Lumbar Spine Complete  Result Date: 10/24/2020 CLINICAL DATA:  Lumbar spine surgery EXAM: DG C-ARM 1-60 MIN; LUMBAR SPINE - COMPLETE 4+ VIEW FLUOROSCOPY TIME:  Fluoroscopy Time:  2 minutes 36 seconds Radiation Exposure Index (if provided by the fluoroscopic device): 145.78 mGy Number of Acquired Spot Images: 5 COMPARISON:  MRI lumbar spine dated 10/10/2020 FINDINGS: Intraoperative fluoroscopic images during L2-5 posterior lumbar fixation with interbody spacers. IMPRESSION: Intraoperative fluoroscopic images during L2-5 fusion. Electronically Signed   By: Julian Hy M.D.   On: 10/24/2020 15:09   DG C-Arm 1-60 Min  Result Date: 10/24/2020 CLINICAL DATA:  Lumbar spine surgery EXAM: DG C-ARM 1-60 MIN; LUMBAR SPINE - COMPLETE 4+ VIEW FLUOROSCOPY TIME:  Fluoroscopy Time:  2 minutes 36 seconds Radiation Exposure Index (if provided by the fluoroscopic device): 145.78 mGy  Number of Acquired Spot Images: 5 COMPARISON:  MRI lumbar spine dated 10/10/2020 FINDINGS: Intraoperative fluoroscopic images during L2-5 posterior lumbar fixation with interbody spacers. IMPRESSION: Intraoperative fluoroscopic images during L2-5 fusion. Electronically Signed   By: Julian Hy M.D.   On: 10/24/2020 15:09   XR Lumbar Spine 2-3 Views  Result Date: 11/08/2020 AP and lateral radiographs of the lumbar spine demonstrate pedicle screws and rods in good position and alignment,  Interbody cages L2-3, L3-4 and L4-5 in normal aligment. Lordotic curve measures 50 degrees c/w 31 pre op.    Discharge Instructions    Call MD / Call 911   Complete by: As directed    If you experience chest pain or shortness of breath, CALL 911 and be transported to the hospital emergency room.  If you develope a fever above 101 F, pus (white drainage) or increased drainage or redness at the wound, or calf pain, call your surgeon's office.   Constipation Prevention   Complete by: As directed    Drink plenty of fluids.  Prune juice may be helpful.  You may use a stool softener, such as Colace (over the counter) 100 mg twice a day.  Use MiraLax (over the counter) for constipation as needed.   Diet - low sodium heart healthy   Complete by: As directed    Discharge instructions   Complete by: As directed    Call if there is increasing drainage, fever greater than 101.5, severe head aches, and worsening nausea or light sensitivity. If shortness of breath, bloody cough or chest tightness or pain go to an emergency room. No lifting greater than 10 lbs. Avoid bending, stooping and twisting. Use brace when sitting and out of bed even to go to bathroom. Walk in house for first 2 weeks then may start to get out slowly increasing distances up to one mile by 4-6 weeks post op. After 5 days may shower and change dressing following bathing with shower.When bathing remove the brace shower and replace brace before  getting out of the shower. If drainage, keep dry dressing and do not bathe the incision, use an moisture impervious dressing. Please call and return for scheduled follow up appointment 2 weeks from the time of surgery.   Driving restrictions   Complete by: As directed    No driving for 6 weeks   Increase activity slowly as tolerated   Complete by: As directed  Lifting restrictions   Complete by: As directed    No lifting for 12 weeks       Follow-up Information    Jessy Oto, MD In 2 weeks.   Specialty: Orthopedic Surgery Why: For wound re-check Contact information: [place] [address] [phone]        [facility] Follow up.   Why: 3n1 and rollator arranged Contact information: [address] [place] [address] [phone]        [facility] Follow up.   Specialty: Rehabilitation Why: referral made for outpt PT- they will call to f/u and schedule Contact information: [id number] [place] [phone]              Discharge Plan:  discharge to home  Disposition:     Signed: Benjiman Core  11/14/2020, 3:51 PM

## 2020-11-16 ENCOUNTER — Other Ambulatory Visit: Payer: Self-pay

## 2020-11-16 ENCOUNTER — Ambulatory Visit: Payer: Self-pay | Admitting: Physical Therapy

## 2020-11-16 ENCOUNTER — Encounter: Payer: Self-pay | Admitting: Physical Therapy

## 2020-11-16 DIAGNOSIS — M6281 Muscle weakness (generalized): Secondary | ICD-10-CM

## 2020-11-16 DIAGNOSIS — R2689 Other abnormalities of gait and mobility: Secondary | ICD-10-CM

## 2020-11-16 DIAGNOSIS — G8929 Other chronic pain: Secondary | ICD-10-CM

## 2020-11-16 DIAGNOSIS — M545 Low back pain, unspecified: Secondary | ICD-10-CM

## 2020-11-16 NOTE — Therapy (Signed)
[place], [place], [address] Phone: [phone]   Fax:  [phone]  Physical Therapy Treatment  Patient Details  Name: Sydney Williams MRN: [id number] Date of Birth: 06/08/1966 Referring Provider (PT): Jessy Oto, MD   Encounter Date: 11/16/2020   PT End of Session - 11/16/20 0918    Visit Number 2    Number of Visits 13    Date for PT Re-Evaluation 12/12/20    Authorization Type CAFA    PT Start Time 0915    PT Stop Time 1000    PT Time Calculation (min) 45 min           Past Medical History:  Diagnosis Date  . Asthma    2015 hospitaliation for asthma  . Diabetes mellitus    Type II  . Family history of breast cancer   . Fibroids 2010  . Genital herpes   . Genital herpes 11/30/2014  . GERD (gastroesophageal reflux disease)   . Hyperlipidemia   . Hypertension   . Impaired fasting blood sugar   . Migraines   . Obesity   . Right carpal tunnel syndrome   . Smoker     Past Surgical History:  Procedure Laterality Date  . CARDIAC CATHETERIZATION    . CARPAL TUNNEL RELEASE Right 08/30/2020   Procedure: RIGHT CARPAL TUNNEL RELEASE;  Surgeon: Leandrew Koyanagi, MD;  Location: [facility];  Service: Orthopedics;  Laterality: Right;  . COLONOSCOPY  03/13/2016   Dr. Carlean Purl, normal, repeat 2027  . CYST EXCISION     left neck/postauricular region, benign  . DILATION AND CURETTAGE OF UTERUS    . LAPAROSCOPIC ABDOMINAL EXPLORATION     removal of ectopic preg  . LEFT HEART CATH AND CORONARY ANGIOGRAPHY N/A 06/23/2017   Procedure: LEFT HEART CATH AND CORONARY ANGIOGRAPHY;  Surgeon: Nelva Bush, MD;  Location: [facility];  Service: Cardiovascular;  Laterality: N/A;  . TUBAL LIGATION      There were no vitals filed for this visit.   Subjective Assessment - 11/16/20 0917    Subjective Pt reports she has no back pain but her legs ache at night and throughout the day with  burning and needles down as far as her knees.    Currently in Pain? Yes    Pain Score 9     Pain Location Leg    Pain Orientation Right;Left;Anterior;Lateral    Pain Descriptors / Indicators Aching;Burning;Pins and needles    Pain Type Surgical pain                             [facility] Adult PT Treatment/Exercise - 11/16/20 0001      Lumbar Exercises: Stretches   Single Knee to Chest Stretch 10 seconds;3 reps    Hip Flexor Stretch Limitations standing gentle hip flexor stretch at sink 10 sec x 2 bilat      Lumbar Exercises: Aerobic   Nustep 5 minutes UE/LE level 3   with TLSO     Lumbar Exercises: Seated   Sit to Stand 10 reps   with TLSO     Lumbar Exercises: Supine   Glut Set 5 seconds;15 reps   hooklying   Clam 20 reps   red band   Clam Limitations cues for breathing and control of LE and trunk    Bent Knee Raise 20 reps    Bent Knee Raise Limitations  cues to maintain Trasnverse abdminal contraction and neutral spine, cues for breathing    Other Supine Lumbar Exercises Bent knee fall out x 10                    PT Short Term Goals - 10/31/20 1114      PT SHORT TERM GOAL #1   Title Patient will be I with initial HEP to progress with PT    Time 3    Period Weeks    Status New    Target Date 11/21/20      PT SHORT TERM GOAL #2   Title PT with review HEP with patient by 3rd visit    Time 3    Period Weeks    Status New    Target Date 11/21/20      PT SHORT TERM GOAL #3   Title Patient will be able to ambulate household distances without AD to improve independence    Time 3    Period Weeks    Status New    Target Date 11/21/20             PT Long Term Goals - 10/31/20 1116      PT LONG TERM GOAL #1   Title Patient will be I with final HEP to maintain progress from PT    Time 6    Period Weeks    Status New    Target Date 12/12/20      PT LONG TERM GOAL #2   Title Patient will be able to ambulate community level distances  with LRAD to be able to grocery shop    Time 6    Period Weeks    Status New    Target Date 12/12/20      PT LONG TERM GOAL #3   Title Patient will demonstrate hip strength >/= 4/5 MMT and knee/ankle strength 5/5 MMT to improve walking and transfers    Time 6    Period Weeks    Status New    Target Date 12/12/20      PT LONG TERM GOAL #4   Title Patient will report </= 4/10 pain level with activity to reduce functional limitation    Time 6    Period Weeks    Status New    Target Date 12/12/20      PT LONG TERM GOAL #5   Title Patient will report improved functional status >/= 44% on FOTO    Time 6    Period Weeks    Status New    Target Date 12/12/20                 Plan - 11/16/20 3614    Clinical Impression Statement Pt arrives without AD and reports she left rollator in the car because she was able to pull up to the door for drop off. She reports she is still using rollator indoors for when she needs to sit down. She reports she is able to be active on her feet in home about 10 minutes before she needs to sit on her rollator. She reports no back pain, only leg pain at 9/10. Began Nutep and reviewed HEP, progressed to red band for clams. Began gentle SKTC and standing gentle hip flexor stretch. TLSO remained donned except for hooklying exercises.    PT Next Visit Plan Review HEP and progress PRN, ensure proper TLSO fit, progress core and LE strengthening abiding by precautions, hip and LE  stretching    PT Home Exercise Plan [id number]: supine march, bent knee fall out, clam with yellow, glute sets, LAQ, sit<>stand, standing heel raises           Patient will benefit from skilled therapeutic intervention in order to improve the following deficits and impairments:  Abnormal gait,Decreased range of motion,Difficulty walking,Decreased activity tolerance,Pain,Decreased balance,Impaired flexibility,Improper body mechanics,Postural dysfunction,Decreased strength  Visit  Diagnosis: Chronic bilateral low back pain, unspecified whether sciatica present  Muscle weakness (generalized)  Other abnormalities of gait and mobility     Problem List Patient Active Problem List   Diagnosis Date Noted  . Degenerative disc disease, lumbar   . History of lumbar spinal fusion 10/24/2020  . Seasonal and perennial allergic rhinoconjunctivitis 09/18/2020  . Carpal tunnel syndrome on right   . Seronegative rheumatoid arthritis ([place]) 08/04/2020  . Allergic conjunctivitis of both eyes 07/19/2020  . Lumbar radiculopathy, chronic 04/03/2020  . Elevated blood uric acid level 04/03/2020  . Family history of cervical cancer 02/02/2018  . Family history of breast cancer 02/02/2018  . Vitamin D deficiency 02/02/2018  . Depression 06/19/2017  . Hypomagnesemia 06/19/2017  . 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 Snoring 09/11/2016  . History of migraine 09/11/2016  . Frequent headaches 09/11/2016  . Moderate persistent asthma 06/01/2015  . Gastroesophageal reflux disease without esophagitis 06/01/2015  . Other allergic rhinitis 06/01/2015  . Tobacco user 11/30/2014  . Essential hypertension 11/30/2014  . Hyperlipidemia 11/30/2014  . Obesity 07/02/2012  . Type 2 diabetes mellitus without complication, without long-term current use of insulin ([place]) 07/02/2012    Dorene Ar, PTA 11/16/2020, 10:26 AM  [place], [place], [address] Phone: [phone]   Fax:  [phone]  Name: Sydney Williams MRN: [id number] Date of Birth: 1966-10-26

## 2020-11-21 ENCOUNTER — Encounter: Payer: Medicaid Other | Admitting: Physical Therapy

## 2020-11-23 MED FILL — ?METOPROLOL TART 50 MG TABL: 50 | 30 days supply | Qty: 60 | Fill #1

## 2020-11-23 MED FILL — ?ACYCLOVIR 200 MG CAPSU: 200 | 30 days supply | Qty: 60 | Fill #1

## 2020-11-24 ENCOUNTER — Ambulatory Visit: Payer: Medicaid Other | Admitting: Physical Therapy

## 2020-11-24 ENCOUNTER — Other Ambulatory Visit: Payer: Self-pay

## 2020-11-24 ENCOUNTER — Encounter: Payer: Self-pay | Admitting: Physical Therapy

## 2020-11-24 DIAGNOSIS — R2689 Other abnormalities of gait and mobility: Secondary | ICD-10-CM

## 2020-11-24 DIAGNOSIS — G8929 Other chronic pain: Secondary | ICD-10-CM

## 2020-11-24 DIAGNOSIS — M545 Low back pain, unspecified: Secondary | ICD-10-CM

## 2020-11-24 DIAGNOSIS — M6281 Muscle weakness (generalized): Secondary | ICD-10-CM

## 2020-11-24 NOTE — Therapy (Signed)
[place], [place], [address] Phone: [phone]   Fax:  [phone]  Physical Therapy Treatment  Patient Details  Name: Sydney Williams MRN: [id number] Date of Birth: 1966/03/09 Referring Provider (PT): Jessy Oto, MD   Encounter Date: 11/24/2020   PT End of Session - 11/24/20 0851    Visit Number 3    Number of Visits 13    Date for PT Re-Evaluation 12/12/20    Authorization Type CAFA    PT Start Time 0845    PT Stop Time 0930    PT Time Calculation (min) 45 min           Past Medical History:  Diagnosis Date  . Asthma    2015 hospitaliation for asthma  . Diabetes mellitus    Type II  . Family history of breast cancer   . Fibroids 2010  . Genital herpes   . Genital herpes 11/30/2014  . GERD (gastroesophageal reflux disease)   . Hyperlipidemia   . Hypertension   . Impaired fasting blood sugar   . Migraines   . Obesity   . Right carpal tunnel syndrome   . Smoker     Past Surgical History:  Procedure Laterality Date  . CARDIAC CATHETERIZATION    . CARPAL TUNNEL RELEASE Right 08/30/2020   Procedure: RIGHT CARPAL TUNNEL RELEASE;  Surgeon: Leandrew Koyanagi, MD;  Location: [facility];  Service: Orthopedics;  Laterality: Right;  . COLONOSCOPY  03/13/2016   Dr. Carlean Purl, normal, repeat 2027  . CYST EXCISION     left neck/postauricular region, benign  . DILATION AND CURETTAGE OF UTERUS    . LAPAROSCOPIC ABDOMINAL EXPLORATION     removal of ectopic preg  . LEFT HEART CATH AND CORONARY ANGIOGRAPHY N/A 06/23/2017   Procedure: LEFT HEART CATH AND CORONARY ANGIOGRAPHY;  Surgeon: Nelva Bush, MD;  Location: [facility];  Service: Cardiovascular;  Laterality: N/A;  . TUBAL LIGATION      There were no vitals filed for this visit.   Subjective Assessment - 11/24/20 0849    Subjective I am having some lower back pain and leg pain. My left leg is going numb for 3 seconds at a  time and the leg will not move. It happened twice last week. I have been trying to clean the house and trying to walk more but the right leg starts going numb and burning. I try not to use the cart at the store but can only last 3-4 minutes due to legs burning and feeling numb.    Currently in Pain? Yes    Pain Score 8     Pain Location Back    Pain Orientation Lower    Pain Descriptors / Indicators Aching    Pain Type Surgical pain    Pain Radiating Towards bilateral thighs    Aggravating Factors  standing, walking    Pain Relieving Factors rest, meds              [facility] PT Assessment - 11/24/20 0001      Standardized Balance Assessment   Standardized Balance Assessment Five Times Sit to Stand    Five times sit to stand comments  21.1 s with UE                         [facility] Adult PT Treatment/Exercise - 11/24/20 0001      Lumbar Exercises: Stretches  Single Knee to Chest Stretch 10 seconds;3 reps    Hip Flexor Stretch Limitations standing gentle hip flexor stretch at sink 10 sec x 2 bilat    Piriformis Stretch 3 reps;10 seconds    Piriformis Stretch Limitations push and pull x 3 each      Lumbar Exercises: Aerobic   Nustep 5 minutes UE/LE level 4   with TLSO     Lumbar Exercises: Standing   Heel Raises 20 reps    Other Standing Lumbar Exercises alternating march at counter x 10    Other Standing Lumbar Exercises hip abduction at counter x 10 each      Lumbar Exercises: Seated   Long Arc Quad on Chair 20 reps    LAQ on Chair Limitations cues for breathing    Sit to Stand 10 reps   with TLSO     Lumbar Exercises: Supine   Glut Set 5 seconds;20 reps   hooklying   Clam 20 reps   red band   Clam Limitations cues for breathing and control of LE and trunk    Bent Knee Raise 20 reps   added opposite UE lift   Bent Knee Raise Limitations cues to maintain Trasnverse abdminal contraction and neutral spine, cues for breathing    Other Supine Lumbar Exercises Bent  knee fall out x 20    Other Supine Lumbar Exercises ball squeeze with abdominal draw in x 20 - 5 sec holds and cues for breathing.                    PT Short Term Goals - 11/24/20 0951      PT SHORT TERM GOAL #1   Title Patient will be I with initial HEP to progress with PT    Baseline pt reports compliance, and demonstrates independence.    Time 3    Period Weeks    Status Achieved    Target Date 11/21/20      PT SHORT TERM GOAL #2   Title PT with review FOTO with patient by 3rd visit    Time 3    Period Weeks    Status On-going    Target Date 11/21/20      PT SHORT TERM GOAL #3   Title Patient will be able to ambulate household distances without AD to improve independence    Baseline limited to 3-5 minutes    Time 3    Period Weeks    Status On-going    Target Date 11/21/20             PT Long Term Goals - 10/31/20 1116      PT LONG TERM GOAL #1   Title Patient will be I with final HEP to maintain progress from PT    Time 6    Period Weeks    Status New    Target Date 12/12/20      PT LONG TERM GOAL #2   Title Patient will be able to ambulate community level distances with LRAD to be able to grocery shop    Time 6    Period Weeks    Status New    Target Date 12/12/20      PT LONG TERM GOAL #3   Title Patient will demonstrate hip strength >/= 4/5 MMT and knee/ankle strength 5/5 MMT to improve walking and transfers    Time 6    Period Weeks    Status New    Target Date  12/12/20      PT LONG TERM GOAL #4   Title Patient will report </= 4/10 pain level with activity to reduce functional limitation    Time 6    Period Weeks    Status New    Target Date 12/12/20      PT LONG TERM GOAL #5   Title Patient will report improved functional status >/= 44% on FOTO    Time 6    Period Weeks    Status New    Target Date 12/12/20                 Plan - 11/24/20 0914    Clinical Impression Statement Pt arroves reporting pain started in  lower back 3 days ago. She reports compliance with HEP. She continues to report difficulty with legs feeling numb and burning after 3-4 minutes of gait. She has fatigue with standing therex with HR 90 bpm and SPO2 97%. She requires cues to prevent holding breath with therex. Added hip stretching with pt reporting feeling of pulling in low back and hips. After session she reported a little tenderness in lumbar and no leg pain. Encouraged her to continue HEP. STG#1 met.    PT Next Visit Plan Review HEP and progress PRN, ensure proper TLSO fit, progress core and LE strengthening abiding by precautions, hip and LE stretching    PT Home Exercise Plan [email or web address]: supine march, bent knee fall out, clam with yellow, glute sets, LAQ, sit<>stand, standing heel raises           Patient will benefit from skilled therapeutic intervention in order to improve the following deficits and impairments:  Abnormal gait,Decreased range of motion,Difficulty walking,Decreased activity tolerance,Pain,Decreased balance,Impaired flexibility,Improper body mechanics,Postural dysfunction,Decreased strength  Visit Diagnosis: Chronic bilateral low back pain, unspecified whether sciatica present  Muscle weakness (generalized)  Other abnormalities of gait and mobility     Problem List Patient Active Problem List   Diagnosis Date Noted  . Degenerative disc disease, lumbar   . History of lumbar spinal fusion 10/24/2020  . Seasonal and perennial allergic rhinoconjunctivitis 09/18/2020  . Carpal tunnel syndrome on right   . Seronegative rheumatoid arthritis ([place]) 08/04/2020  . Allergic conjunctivitis of both eyes 07/19/2020  . Lumbar radiculopathy, chronic 04/03/2020  . Elevated blood uric acid level 04/03/2020  . Family history of cervical cancer 02/02/2018  . Family history of breast cancer 02/02/2018  . Vitamin D deficiency 02/02/2018  . Depression 06/19/2017  . Hypomagnesemia 06/19/2017  . Hypersensitivity to  pneumococcal vaccine 01/08/2017  . Snoring 09/11/2016  . History of migraine 09/11/2016  . Frequent headaches 09/11/2016  . Moderate persistent asthma 06/01/2015  . Gastroesophageal reflux disease without esophagitis 06/01/2015  . Other allergic rhinitis 06/01/2015  . Tobacco user 11/30/2014  . Essential hypertension 11/30/2014  . Hyperlipidemia 11/30/2014  . Obesity 07/02/2012  . Type 2 diabetes mellitus without complication, without long-term current use of insulin ([place]) 07/02/2012    Dorene Ar, PTA 11/24/2020, 9:56 AM  [place], [place], [address] Phone: [phone]   Fax:  [phone]  Name: Sydney Williams MRN: [id number] Date of Birth: April 27, 1966

## 2020-11-28 ENCOUNTER — Ambulatory Visit: Payer: Self-pay | Admitting: Physical Therapy

## 2020-11-30 ENCOUNTER — Other Ambulatory Visit: Payer: Self-pay

## 2020-11-30 ENCOUNTER — Encounter: Payer: Self-pay | Admitting: Physical Therapy

## 2020-11-30 ENCOUNTER — Ambulatory Visit: Payer: Self-pay | Attending: Specialist | Admitting: Physical Therapy

## 2020-11-30 DIAGNOSIS — M6281 Muscle weakness (generalized): Secondary | ICD-10-CM | POA: Insufficient documentation

## 2020-11-30 DIAGNOSIS — R2689 Other abnormalities of gait and mobility: Secondary | ICD-10-CM | POA: Insufficient documentation

## 2020-11-30 DIAGNOSIS — M545 Low back pain, unspecified: Secondary | ICD-10-CM | POA: Insufficient documentation

## 2020-11-30 DIAGNOSIS — G8929 Other chronic pain: Secondary | ICD-10-CM | POA: Insufficient documentation

## 2020-11-30 NOTE — Patient Instructions (Signed)
Access Code: [email or web address] URL: [email or web address] Date: 11/30/2020 Prepared by: Hilda Blades  Exercises Hooklying Single Knee to Chest - 1-2 x daily - 7 x weekly - 3 reps - 30 seconds hold Supine Piriformis Stretch with Foot on Ground - 1-2 x daily - 7 x weekly - 3 reps - 30 seconds hold Supine Figure 4 Piriformis Stretch - 1-2 x daily - 7 x weekly - 3 reps - 30 seconds hold Supine March with Posterior Pelvic Tilt - 1 x daily - 7 x weekly - 2 sets - 10 reps - 3 seconds hold Small Range Straight Leg Raise - 1 x daily - 7 x weekly - 2 sets - 5 reps Bent Knee Fallouts - 1 x daily - 7 x weekly - 10 reps - 2 sets Hooklying Clamshell with Resistance - 1 x daily - 7 x weekly - 10 reps - 2 sets - 3 seconds hold Seated Long Arc Quad - 1 x daily - 7 x weekly - 20 reps - 2 sets Sit to Stand - 1 x daily - 7 x weekly - 10 reps - 2 sets Heel rises with counter support - 1 x daily - 7 x weekly - 20 reps - 2 sets

## 2020-11-30 NOTE — Therapy (Signed)
[place], [place], [address] Phone: [phone]   Fax:  [phone]  Physical Therapy Treatment  Patient Details  Name: Sydney Williams MRN: [id number] Date of Birth: 06/11/1966 Referring Provider (PT): Jessy Oto, MD   Encounter Date: 11/30/2020   PT End of Session - 11/30/20 0840    Visit Number 4    Number of Visits 13    Date for PT Re-Evaluation 12/12/20    Authorization Type CAFA    PT Start Time 0832    PT Stop Time 0915    PT Time Calculation (min) 43 min    Activity Tolerance Patient tolerated treatment well    Behavior During Therapy [facility] for tasks assessed/performed           Past Medical History:  Diagnosis Date  . Asthma    2015 hospitaliation for asthma  . Diabetes mellitus    Type II  . Family history of breast cancer   . Fibroids 2010  . Genital herpes   . Genital herpes 11/30/2014  . GERD (gastroesophageal reflux disease)   . Hyperlipidemia   . Hypertension   . Impaired fasting blood sugar   . Migraines   . Obesity   . Right carpal tunnel syndrome   . Smoker     Past Surgical History:  Procedure Laterality Date  . CARDIAC CATHETERIZATION    . CARPAL TUNNEL RELEASE Right 08/30/2020   Procedure: RIGHT CARPAL TUNNEL RELEASE;  Surgeon: Leandrew Koyanagi, MD;  Location: [facility];  Service: Orthopedics;  Laterality: Right;  . COLONOSCOPY  03/13/2016   Dr. Carlean Purl, normal, repeat 2027  . CYST EXCISION     left neck/postauricular region, benign  . DILATION AND CURETTAGE OF UTERUS    . LAPAROSCOPIC ABDOMINAL EXPLORATION     removal of ectopic preg  . LEFT HEART CATH AND CORONARY ANGIOGRAPHY N/A 06/23/2017   Procedure: LEFT HEART CATH AND CORONARY ANGIOGRAPHY;  Surgeon: Nelva Bush, MD;  Location: [facility];  Service: Cardiovascular;  Laterality: N/A;  . TUBAL LIGATION      There were no vitals filed for this visit.   Subjective Assessment -  11/30/20 0832    Subjective Patient reports legs started hurting Monday, is having some numbness on the right side thigh, also notes having some spasms on the right side as well. No mechanism to bring on leg pain. Patient states that when she walks her right leg will go numb, so she is using a walker, it has not given out on her.    Patient Stated Goals Get back to walking and standing longer periods without pain    Currently in Pain? Yes    Pain Score 8     Pain Location Back    Pain Orientation Lower    Pain Descriptors / Indicators Tender    Pain Type Surgical pain    Pain Onset More than a month ago    Pain Frequency Constant    Pain Score 8    Pain Location Leg    Pain Orientation Right;Left   R > L   Pain Descriptors / Indicators Burning;Spasm;Numbness    Pain Type Surgical pain    Pain Onset 1 to 4 weeks ago    Pain Frequency Constant              [facility] PT Assessment - 11/30/20 0001      Functional Tests   Functional tests Sit  to Stand      Sit to Stand   Comments Able to perform without UE support from standard chair                         [facility] Adult PT Treatment/Exercise - 11/30/20 0001      Exercises   Exercises Lumbar      Lumbar Exercises: Stretches   Single Knee to Chest Stretch 2 reps;30 seconds    Piriformis Stretch 2 reps;30 seconds      Lumbar Exercises: Seated   Sit to Stand 10 reps    Sit to Stand Limitations TLSO donned, no UE support      Lumbar Exercises: Supine   Pelvic Tilt 5 reps;5 seconds    Pelvic Tilt Limitations tactile cueing for proper PPT    Clam 10 reps;3 seconds   2 sets   Clam Limitations cued for PPT    Bent Knee Raise 10 reps;3 seconds   2 sets   Bent Knee Raise Limitations cued with PPT    Single Leg Bridge 5 reps   2 sets   Bridge with Ball Squeeze Limitations greater diffiuclty on left    Other Supine Lumbar Exercises Bent knee fall out 2 x 5 each   cued for PPT                 PT Education -  11/30/20 0839    Education Details HEP update    Person(s) Educated Patient    Methods Explanation;Demonstration;Verbal cues;Handout    Comprehension Verbalized understanding;Returned demonstration;Verbal cues required;Need further instruction            PT Short Term Goals - 11/24/20 0951      PT SHORT TERM GOAL #1   Title Patient will be I with initial HEP to progress with PT    Baseline pt reports compliance, and demonstrates independence.    Time 3    Period Weeks    Status Achieved    Target Date 11/21/20      PT SHORT TERM GOAL #2   Title PT with review FOTO with patient by 3rd visit    Time 3    Period Weeks    Status On-going    Target Date 11/21/20      PT SHORT TERM GOAL #3   Title Patient will be able to ambulate household distances without AD to improve independence    Baseline limited to 3-5 minutes    Time 3    Period Weeks    Status On-going    Target Date 11/21/20             PT Long Term Goals - 10/31/20 1116      PT LONG TERM GOAL #1   Title Patient will be I with final HEP to maintain progress from PT    Time 6    Period Weeks    Status New    Target Date 12/12/20      PT LONG TERM GOAL #2   Title Patient will be able to ambulate community level distances with LRAD to be able to grocery shop    Time 6    Period Weeks    Status New    Target Date 12/12/20      PT LONG TERM GOAL #3   Title Patient will demonstrate hip strength >/= 4/5 MMT and knee/ankle strength 5/5 MMT to improve walking and transfers    Time 6  Period Weeks    Status New    Target Date 12/12/20      PT LONG TERM GOAL #4   Title Patient will report </= 4/10 pain level with activity to reduce functional limitation    Time 6    Period Weeks    Status New    Target Date 12/12/20      PT LONG TERM GOAL #5   Title Patient will report improved functional status >/= 44% on FOTO    Time 6    Period Weeks    Status New    Target Date 12/12/20                  Plan - 11/30/20 1610    Clinical Impression Statement Patient tolerated therapy well with no adverse effects. She reports increased right lateral and anterior thigh pain, numbness, and spasm this visit. Therapy focused on incorporating lower back and hip stretching, and progressing mat level exercises. Patient able to perform exercises with good tolerance and exhibits improved core activation, but did exhibit increased weakness of left hip compared to right despite less pain on that side. Patient encouraged to continue with walking and movement throughout day. Patient would benefit from continued skilled PT to progress her strength and mobility in order to improve walking and maximize functional ability.    PT Treatment/Interventions ADLs/Self Care Home Management;Cryotherapy;Electrical Stimulation;Iontophoresis 4mg /ml Dexamethasone;Moist Heat;Ultrasound;Neuromuscular re-education;Balance training;Therapeutic exercise;Therapeutic activities;Functional mobility training;Stair training;Gait training;Patient/family education;Manual techniques;Dry needling;Passive range of motion;Taping;Spinal Manipulations;Joint Manipulations    PT Next Visit Plan Review HEP and progress PRN, progress core and LE strengthening abiding by precautions (progressing to standing as able), hip and LE stretching    PT Home Exercise Plan [email or web address]    Consulted and Agree with Plan of Care Patient           Patient will benefit from skilled therapeutic intervention in order to improve the following deficits and impairments:  Abnormal gait,Decreased range of motion,Difficulty walking,Decreased activity tolerance,Pain,Decreased balance,Impaired flexibility,Improper body mechanics,Postural dysfunction,Decreased strength  Visit Diagnosis: Chronic bilateral low back pain, unspecified whether sciatica present  Muscle weakness (generalized)  Other abnormalities of gait and mobility     Problem List Patient Active  Problem List   Diagnosis Date Noted  . Degenerative disc disease, lumbar   . History of lumbar spinal fusion 10/24/2020  . Seasonal and perennial allergic rhinoconjunctivitis 09/18/2020  . Carpal tunnel syndrome on right   . Seronegative rheumatoid arthritis ([place]) 08/04/2020  . Allergic conjunctivitis of both eyes 07/19/2020  . Lumbar radiculopathy, chronic 04/03/2020  . Elevated blood uric acid level 04/03/2020  . Family history of cervical cancer 02/02/2018  . Family history of breast cancer 02/02/2018  . Vitamin D deficiency 02/02/2018  . Depression 06/19/2017  . Hypomagnesemia 06/19/2017  . 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 Snoring 09/11/2016  . History of migraine 09/11/2016  . Frequent headaches 09/11/2016  . Moderate persistent asthma 06/01/2015  . Gastroesophageal reflux disease without esophagitis 06/01/2015  . Other allergic rhinitis 06/01/2015  . Tobacco user 11/30/2014  . Essential hypertension 11/30/2014  . Hyperlipidemia 11/30/2014  . Obesity 07/02/2012  . Type 2 diabetes mellitus without complication, without long-term current use of insulin ([place]) 07/02/2012    Hilda Blades, PT, DPT, LAT, ATC 11/30/20  9:50 AM Phone: [phone] Fax: [facility] [address] [place], [place], [address] Phone: [phone]   Fax:  [phone]  Name: EUSTOLIA DRENNEN MRN: [id number] Date of  Birth: 1966/05/29

## 2020-12-04 ENCOUNTER — Other Ambulatory Visit: Payer: Self-pay | Admitting: Critical Care Medicine

## 2020-12-04 DIAGNOSIS — I10 Essential (primary) hypertension: Secondary | ICD-10-CM

## 2020-12-04 MED FILL — ?ATORVASTATIN 40MG TABLET: 40 | 30 days supply | Qty: 30 | Fill #2

## 2020-12-04 MED FILL — AMLODIPINE BESYLATE 10 MG T: 10 | 30 days supply | Qty: 30 | Fill #0

## 2020-12-04 MED FILL — HYDROCHLOROTHIAZIDE 25 MG T: 25 | 30 days supply | Qty: 30 | Fill #7

## 2020-12-04 NOTE — Telephone Encounter (Signed)
Requested medication (s) are due for refill today: yes  Requested medication (s) are on the active medication list: yes  Last refill:  10/30/20  Future visit scheduled: yes  Notes to clinic: historical provider   Requested Prescriptions  Pending Prescriptions Disp Refills   allopurinol (ZYLOPRIM) 100 MG tablet [Pharmacy Med Name: ALLOPURINOL 100MG  TABLET 100 Tablet] 30 tablet 6    Sig: TAKE 1 TABLET (100 MG TOTAL) BY MOUTH DAILY.      Endocrinology:  Gout Agents Passed - 12/04/2020  8:51 AM      Passed - Uric Acid in normal range and within 360 days    Uric Acid, Serum  Date Value Ref Range Status  10/27/2020 7.1 2.5 - 7.1 mg/dL Final    Comment:    Performed at [facility], [place] [address] [place], [place] [address]   Uric Acid  Date Value Ref Range Status  09/16/2019 8.5 (H) 2.5 - 7.1 mg/dL Final    Comment:               Therapeutic target for gout patients: <6.0          Passed - Cr in normal range and within 360 days    Creat  Date Value Ref Range Status  08/04/2020 0.52 0.50 - 1.05 mg/dL Final    Comment:    For patients >39 years of age, the reference limit for Creatinine is approximately 13% higher for people identified as African-American. .    Creatinine, Ser  Date Value Ref Range Status  10/27/2020 0.68 0.44 - 1.00 mg/dL Final   Creatinine, Urine  Date Value Ref Range Status  01/08/2017 122 20 - 320 mg/dL Final          Passed - Valid encounter within last 12 months    Recent Outpatient Visits           2 months ago Need for influenza vaccination   [place], Jarome [place], RPH-CPP   2 months ago Type 2 diabetes mellitus without complication, without long-term current use of insulin ([place])   [place] Elsie Stain, MD   6 months ago Type 2 diabetes mellitus without complication, without long-term current use of insulin ([place])   [place] Elsie Stain, MD   7 months ago Lumbar radiculopathy, chronic   [place], [place], NP   8 months ago Lumbar radiculopathy, chronic   [place], MD       Future Appointments             In 2 days Jessy Oto, MD [place]   In 2 weeks Elsie Stain, MD [place]   In 1 month Garnet Sierras, DO [facility]              Signed Prescriptions Disp Refills   amLODipine (NORVASC) 10 MG tablet 30 tablet 0    Sig: TAKE 1 TABLET (10 MG TOTAL) BY MOUTH DAILY. TO LOWER BLOOD PRESSURE      Cardiovascular:  Calcium Channel Blockers Passed - 12/04/2020  8:51 AM      Passed - Last BP in normal range    BP Readings from Last 1 Encounters:  11/08/20 118/80          Passed -  Valid encounter within last 6 months    Recent Outpatient Visits           2 months ago Need for influenza vaccination   [place], RPH-CPP   2 months ago Type 2 diabetes mellitus without complication, without long-term current use of insulin [facility])   [place] Elsie Stain, MD   6 months ago Type 2 diabetes mellitus without complication, without long-term current use of insulin [facility])   [place] Elsie Stain, MD   7 months ago Lumbar radiculopathy, chronic   [place], [place], NP   8 months ago Lumbar radiculopathy, chronic   [place], Patrick E, MD       Future Appointments             In 2 days Jessy Oto, MD [place]   In 2 weeks Elsie Stain, MD [place]   In 1 month Garnet Sierras, DO [facility] [place]

## 2020-12-04 NOTE — Telephone Encounter (Signed)
Requested Prescriptions  Pending Prescriptions Disp Refills  . allopurinol (ZYLOPRIM) 100 MG tablet [Pharmacy Med Name: ALLOPURINOL 100MG  TABLET 100 Tablet] 30 tablet 6    Sig: TAKE 1 TABLET (100 MG TOTAL) BY MOUTH DAILY.     Endocrinology:  Gout Agents Passed - 12/04/2020  8:51 AM      Passed - Uric Acid in normal range and within 360 days    Uric Acid, Serum  Date Value Ref Range Status  10/27/2020 7.1 2.5 - 7.1 mg/dL Final    Comment:    Performed at [facility], [place] [address] [place], [place] [address]   Uric Acid  Date Value Ref Range Status  09/16/2019 8.5 (H) 2.5 - 7.1 mg/dL Final    Comment:               Therapeutic target for gout patients: <6.0         Passed - Cr in normal range and within 360 days    Creat  Date Value Ref Range Status  08/04/2020 0.52 0.50 - 1.05 mg/dL Final    Comment:    For patients >61 years of age, the reference limit for Creatinine is approximately 13% higher for people identified as African-American. .    Creatinine, Ser  Date Value Ref Range Status  10/27/2020 0.68 0.44 - 1.00 mg/dL Final   Creatinine, Urine  Date Value Ref Range Status  01/08/2017 122 20 - 320 mg/dL Final         Passed - Valid encounter within last 12 months    Recent Outpatient Visits          2 months ago Need for influenza vaccination   [place], RPH-CPP   2 months ago Type 2 diabetes mellitus without complication, without long-term current use of insulin ([place])   [place] Elsie Stain, MD   6 months ago Type 2 diabetes mellitus without complication, without long-term current use of insulin ([place])   [place] Elsie Stain, MD   7 months ago Lumbar radiculopathy, chronic   [place], [place], NP   8 months ago Lumbar radiculopathy, chronic   [place], Patrick E, MD      Future Appointments            In 2 days Jessy Oto, MD [place]   In 2 weeks Elsie Stain, MD [place]   In 1 month Garnet Sierras, DO [facility] [place]           . amLODipine (NORVASC) 10 MG tablet [Pharmacy Med Name: AMLODIPINE BESYLATE 10 MG T 10 Tablet] 30 tablet 0    Sig: TAKE 1 TABLET (10 MG TOTAL) BY MOUTH DAILY. TO LOWER BLOOD PRESSURE     Cardiovascular:  Calcium Channel Blockers Passed - 12/04/2020  8:51 AM      Passed - Last BP in normal range    BP Readings from Last 1 Encounters:  11/08/20 118/80         Passed - Valid encounter within last 6 months    Recent Outpatient Visits          2 months ago Need for influenza vaccination   [place], RPH-CPP  2 months ago Type 2 diabetes mellitus without complication, without long-term current use of insulin ([place])   [place] Elsie Stain, MD   6 months ago Type 2 diabetes mellitus without complication, without long-term current use of insulin [facility])   [place] Elsie Stain, MD   7 months ago Lumbar radiculopathy, chronic   [place], [place], NP   8 months ago Lumbar radiculopathy, chronic   [place], Patrick E, MD      Future Appointments            In 2 days Jessy Oto, MD [place]   In 2 weeks Elsie Stain, MD [place]   In 1 month Garnet Sierras, DO [facility] [place]

## 2020-12-05 ENCOUNTER — Other Ambulatory Visit: Payer: Self-pay | Admitting: Critical Care Medicine

## 2020-12-05 ENCOUNTER — Ambulatory Visit: Payer: Self-pay | Admitting: Physical Therapy

## 2020-12-05 MED FILL — ?ALLOPURINOL 100MG TABLET: 100 | 30 days supply | Qty: 30 | Fill #0

## 2020-12-06 ENCOUNTER — Ambulatory Visit (INDEPENDENT_AMBULATORY_CARE_PROVIDER_SITE_OTHER): Payer: Self-pay

## 2020-12-06 ENCOUNTER — Other Ambulatory Visit: Payer: Self-pay

## 2020-12-06 ENCOUNTER — Encounter: Payer: Self-pay | Admitting: Specialist

## 2020-12-06 ENCOUNTER — Ambulatory Visit (INDEPENDENT_AMBULATORY_CARE_PROVIDER_SITE_OTHER): Payer: Self-pay | Admitting: Specialist

## 2020-12-06 VITALS — BP 116/82 | HR 80 | Ht 71.0 in | Wt 283.0 lb

## 2020-12-06 DIAGNOSIS — Z981 Arthrodesis status: Secondary | ICD-10-CM

## 2020-12-06 MED ORDER — ACETAMINOPHEN-CODEINE #3 300-30 MG PO TABS: 1.0000 | ORAL_TABLET | Freq: Four times a day (QID) | ORAL | 0 refills | Status: AC | PRN

## 2020-12-06 NOTE — Progress Notes (Signed)
Post-Op Visit Note   Patient: Sydney Williams           Date of Birth: [birth year]           MRN: [id number] Visit Date: 12/06/2020 PCP: Elsie Stain, MD   Assessment & Plan:6 weeks post L2 to L5 3 level TLIF for DDD and stenosis.  Chief Complaint:  Chief Complaint  Patient presents with  . Lower Back - Routine Post Op   Visit Diagnoses:  1. S/P lumbar fusion   Incision is healing well with 2 small eschars that are also healing no fluctuance of drainage, no retained sutures. Motor is normal. SLR is negative.  Plan:    Call if there is increasing drainage, fever greater than 101.5, severe head aches, and worsening nausea or light sensitivity. If shortness of breath, bloody cough or chest tightness or pain go to an emergency room. No lifting greater than 10 lbs. Avoid bending, stooping and twisting. Use brace when sitting and out of bed even to go to bathroom. Walk slowly increasing distances up to one mile by 4-6 weeks post op. May shower and change dressing following bathing with shower.When bathing remove the brace shower and replace brace before getting out of the shower.  May look for work light or sedentary, no bending.    Follow-Up Instructions: Return in about 6 weeks (around 01/17/2021).   Orders:  Orders Placed This Encounter  Procedures  . XR Lumbar Spine 2-3 Views   No orders of the defined types were placed in this encounter.   Imaging: XR Lumbar Spine 2-3 Views  Result Date: 12/06/2020 L2-3, L3-4 and L4-5 TLIFs with pedicle screws and rods fixing L2 to L5 in good position and alignment. Cages at each of the 3 levels are in good position, no retropulsion, there is bone anterior to the cages or a sentinel sign that is still healing.    PMFS History: Patient Active Problem List   Diagnosis Date Noted  . Degenerative disc disease, lumbar   . History of lumbar spinal fusion 10/24/2020  . Seasonal and perennial allergic rhinoconjunctivitis 09/18/2020   . Carpal tunnel syndrome on right   . Seronegative rheumatoid arthritis ([place]) 08/04/2020  . Allergic conjunctivitis of both eyes 07/19/2020  . Lumbar radiculopathy, chronic 04/03/2020  . Elevated blood uric acid level 04/03/2020  . Family history of cervical cancer 02/02/2018  . Family history of breast cancer 02/02/2018  . Vitamin D deficiency 02/02/2018  . Depression 06/19/2017  . Hypomagnesemia 06/19/2017  . 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 Snoring 09/11/2016  . History of migraine 09/11/2016  . Frequent headaches 09/11/2016  . Moderate persistent asthma 06/01/2015  . Gastroesophageal reflux disease without esophagitis 06/01/2015  . Other allergic rhinitis 06/01/2015  . Tobacco user 11/30/2014  . Essential hypertension 11/30/2014  . Hyperlipidemia 11/30/2014  . Obesity 07/02/2012  . Type 2 diabetes mellitus without complication, without long-term current use of insulin ([place]) 07/02/2012   Past Medical History:  Diagnosis Date  . Asthma    2015 hospitaliation for asthma  . Diabetes mellitus    Type II  . Family history of breast cancer   . Fibroids 2010  . Genital herpes   . Genital herpes 11/30/2014  . GERD (gastroesophageal reflux disease)   . Hyperlipidemia   . Hypertension   . Impaired fasting blood sugar   . Migraines   . Obesity   . Right carpal tunnel syndrome   . Smoker  Family History  Problem Relation Age of Onset  . Diabetes Mother   . Hypertension Mother   . Aneurysm Mother   . Stroke Mother   . Cancer Mother        cervical cancer  . Cancer Maternal Aunt        breast  . Cancer Cousin        breast/breast  . Lupus Cousin   . Heart disease Neg Hx   . Colon cancer Neg Hx   . Allergic rhinitis Neg Hx   . Angioedema Neg Hx   . Asthma Neg Hx   . Atopy Neg Hx   . Eczema Neg Hx   . Immunodeficiency Neg Hx   . Urticaria Neg Hx     Past Surgical History:  Procedure Laterality Date  . CARDIAC CATHETERIZATION    . CARPAL  TUNNEL RELEASE Right 08/30/2020   Procedure: RIGHT CARPAL TUNNEL RELEASE;  Surgeon: Leandrew Koyanagi, MD;  Location: [facility];  Service: Orthopedics;  Laterality: Right;  . COLONOSCOPY  03/13/2016   Dr. Carlean Purl, normal, repeat 2027  . CYST EXCISION     left neck/postauricular region, benign  . DILATION AND CURETTAGE OF UTERUS    . LAPAROSCOPIC ABDOMINAL EXPLORATION     removal of ectopic preg  . LEFT HEART CATH AND CORONARY ANGIOGRAPHY N/A 06/23/2017   Procedure: LEFT HEART CATH AND CORONARY ANGIOGRAPHY;  Surgeon: Nelva Bush, MD;  Location: [facility];  Service: Cardiovascular;  Laterality: N/A;  . TUBAL LIGATION     Social History   Occupational History  . Not on file  Tobacco Use  . Smoking status: Current Every Day Smoker    Packs/day: 0.25    Years: 14.00    Pack years: 3.50    Types: Cigarettes  . Smokeless tobacco: Never Used  Vaping Use  . Vaping Use: Never used  Substance and Sexual Activity  . Alcohol use: Yes    Comment: occ  . Drug use: No  . Sexual activity: Yes    Birth control/protection: Surgical

## 2020-12-06 NOTE — Patient Instructions (Signed)
Call if there is increasing drainage, fever greater than 101.5, severe head aches, and worsening nausea or light sensitivity. If shortness of breath, bloody cough or chest tightness or pain go to an emergency room. No lifting greater than 10 lbs. Avoid bending, stooping and twisting. Use brace when sitting and out of bed even to go to bathroom. Walk slowly increasing distances up to one mile by 4-6 weeks post op. May shower and change dressing following bathing with shower.When bathing remove the brace shower and replace brace before getting out of the shower. Stay with gabapentin for nerve pain. May look for work light or sedentary, no bending.

## 2020-12-07 ENCOUNTER — Other Ambulatory Visit: Payer: Self-pay

## 2020-12-07 ENCOUNTER — Ambulatory Visit: Payer: Self-pay | Admitting: Physical Therapy

## 2020-12-07 ENCOUNTER — Encounter: Payer: Self-pay | Admitting: Physical Therapy

## 2020-12-07 DIAGNOSIS — R2689 Other abnormalities of gait and mobility: Secondary | ICD-10-CM

## 2020-12-07 DIAGNOSIS — M6281 Muscle weakness (generalized): Secondary | ICD-10-CM

## 2020-12-07 DIAGNOSIS — M545 Low back pain, unspecified: Secondary | ICD-10-CM

## 2020-12-07 DIAGNOSIS — G8929 Other chronic pain: Secondary | ICD-10-CM

## 2020-12-07 NOTE — Therapy (Addendum)
[place], [place], [address] Phone: [phone]   Fax:  [phone]  Physical Therapy Treatment  Patient Details  Name: Sydney Williams MRN: [id number] Date of Birth: [birth year] Referring Provider (PT): Jessy Oto, MD   Encounter Date: 12/07/2020   PT End of Session - 12/07/20 0825     Visit Number 5    Number of Visits 13    Date for PT Re-Evaluation 12/12/20    Authorization Type CAFA    PT Start Time 0829    PT Stop Time 0933    PT Time Calculation (min) 64 min    Equipment Utilized During Treatment --    Activity Tolerance Patient tolerated treatment well    Behavior During Therapy [facility] for tasks assessed/performed             Past Medical History:  Diagnosis Date   Asthma    2015 hospitaliation for asthma   Diabetes mellitus    Type II   Family history of breast cancer    Fibroids 2010   Genital herpes    Genital herpes 11/30/2014   GERD (gastroesophageal reflux disease)    Hyperlipidemia    Hypertension    Impaired fasting blood sugar    Migraines    Obesity    Right carpal tunnel syndrome    Smoker     Past Surgical History:  Procedure Laterality Date   CARDIAC CATHETERIZATION     CARPAL TUNNEL RELEASE Right 08/30/2020   Procedure: RIGHT CARPAL TUNNEL RELEASE;  Surgeon: Leandrew Koyanagi, MD;  Location: [facility];  Service: Orthopedics;  Laterality: Right;   COLONOSCOPY  03/13/2016   Dr. Carlean Purl, normal, repeat 2027   CYST EXCISION     left neck/postauricular region, benign   DILATION AND CURETTAGE OF UTERUS     LAPAROSCOPIC ABDOMINAL EXPLORATION     removal of ectopic preg   LEFT HEART CATH AND CORONARY ANGIOGRAPHY N/A 06/23/2017   Procedure: LEFT HEART CATH AND CORONARY ANGIOGRAPHY;  Surgeon: Nelva Bush, MD;  Location: [facility];  Service: Cardiovascular;  Laterality: N/A;   TUBAL LIGATION      There were no vitals filed for this visit.    Subjective Assessment - 12/07/20 0831     Subjective Pt reported still having numbness on the R lateral and anterior thigh. She had a follow up with the dr this week and he said that she should trial TENS to see if it can help with pain managament. Other than that, nothing is new    Pain Score 7     Pain Location Leg    Pain Orientation Right    Pain Descriptors / Indicators Burning;Aching    Pain Type Neuropathic pain    Pain Onset More than a month ago    Pain Frequency Constant                                               [facility] Adult PT Treatment/Exercise - 12/07/20 0001       Lumbar Exercises: Stretches   Single Knee to Chest Stretch 30 seconds;3 reps    Piriformis Stretch 30 seconds;3 reps    Piriformis Stretch Limitations push and pull x 3 each      Lumbar Exercises: Aerobic   Nustep 5 minutes UE/LE level 4  Lumbar Exercises: Seated   Sit to Stand 10 reps    Other Seated Lumbar Exercises Palloff press 2x10, red band    Other Seated Lumbar Exercises Chest press 3# x12, 5# 2x12      Lumbar Exercises: Supine   Clam Other (comment)   2 sets x15 reps   Clam Limitations cued for PPT, slow eccentric return    Bent Knee Raise 3 seconds;5 reps    Bent Knee Raise Limitations cued with PPT   started to burn in her back, d/c exercise this session   Other Supine Lumbar Exercises Bent knee fall out 2 x 5 each      Modalities   Modalities Electrical Stimulation;Moist Heat      Moist Heat Therapy   Number Minutes Moist Heat 20 Minutes    Moist Heat Location Other (comment)   anterior R thigh     Electrical Stimulation   Electrical Stimulation Location R anterior thigh    Electrical Stimulation Action IFC    Electrical Stimulation Parameters 31 output, 20 minutes    Electrical Stimulation Goals Pain                          PT Education - 12/07/20 0824     Education Details HEP update, TENS    Person(s) Educated Patient    Methods  Explanation;Demonstration;Verbal cues;Handout    Comprehension Verbalized understanding;Need further instruction              PT Short Term Goals - 11/24/20 0951       PT SHORT TERM GOAL #1   Title Patient will be I with initial HEP to progress with PT    Baseline pt reports compliance, and demonstrates independence.    Time 3    Period Weeks    Status Achieved    Target Date 11/21/20      PT SHORT TERM GOAL #2   Title PT with review FOTO with patient by 3rd visit    Time 3    Period Weeks    Status On-going    Target Date 11/21/20      PT SHORT TERM GOAL #3   Title Patient will be able to ambulate household distances without AD to improve independence    Baseline limited to 3-5 minutes    Time 3    Period Weeks    Status On-going    Target Date 11/21/20                PT Long Term Goals - 10/31/20 1116       PT LONG TERM GOAL #1   Title Patient will be I with final HEP to maintain progress from PT    Time 6    Period Weeks    Status New    Target Date 12/12/20      PT LONG TERM GOAL #2   Title Patient will be able to ambulate community level distances with LRAD to be able to grocery shop    Time 6    Period Weeks    Status New    Target Date 12/12/20      PT LONG TERM GOAL #3   Title Patient will demonstrate hip strength >/= 4/5 MMT and knee/ankle strength 5/5 MMT to improve walking and transfers    Time 6    Period Weeks    Status New    Target Date 12/12/20      PT  LONG TERM GOAL #4   Title Patient will report </= 4/10 pain level with activity to reduce functional limitation    Time 6    Period Weeks    Status New    Target Date 12/12/20      PT LONG TERM GOAL #5   Title Patient will report improved functional status >/= 44% on FOTO    Time 6    Period Weeks    Status New    Target Date 12/12/20                        Plan - 12/07/20 4801     Clinical Impression Statement Pt tolerated PT well with no adverse effects. She  still reports R lateral and anterior thigh pain and numbness that hasn't resided since last visit. Supine TrA exercises this session increased her back pain despite multimodal cueing, so this session focused on seated core exercises. IFC estim this session was trialed with positive result from the pt and education on how to do this at home with the pts TENS machine was given. Pt would continue to benefit from skilled PT in order to decrease lumbar and radicular pain, increase core musculature    PT Treatment/Interventions ADLs/Self Care Home Management;Cryotherapy;Electrical Stimulation;Iontophoresis 4mg /ml Dexamethasone;Moist Heat;Ultrasound;Neuromuscular re-education;Balance training;Therapeutic exercise;Therapeutic activities;Functional mobility training;Stair training;Gait training;Patient/family education;Manual techniques;Dry needling;Passive range of motion;Taping;Spinal Manipulations;Joint Manipulations    PT Next Visit Plan Review HEP and progress PRN, progress core and LE strengthening abiding by precautions (progressing to standing as able), hip and LE stretching    PT Home Exercise Plan [id number]    Consulted and Agree with Plan of Care Patient             Patient will benefit from skilled therapeutic intervention in order to improve the following deficits and impairments:  Abnormal gait,Decreased range of motion,Difficulty walking,Decreased activity tolerance,Pain,Decreased balance,Impaired flexibility,Improper body mechanics,Postural dysfunction,Decreased strength  Visit Diagnosis: Chronic bilateral low back pain, unspecified whether sciatica present  Muscle weakness (generalized)  Other abnormalities of gait and mobility      Problem List Patient Active Problem List   Diagnosis Date Noted   Degenerative disc disease, lumbar    History of lumbar spinal fusion 10/24/2020   Seasonal and perennial allergic rhinoconjunctivitis 09/18/2020   Carpal tunnel syndrome on right     Seronegative rheumatoid arthritis ([place]) 08/04/2020   Allergic conjunctivitis of both eyes 07/19/2020   Lumbar radiculopathy, chronic 04/03/2020   Elevated blood uric acid level 04/03/2020   Family history of cervical cancer 02/02/2018   Family history of breast cancer 02/02/2018   Vitamin D deficiency 02/02/2018   Depression 06/19/2017   Hypomagnesemia 06/19/2017   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 [place], [place], [address] Phone: [phone]   Fax:  [phone]  Name: Sydney Williams MRN: [id number] Date of Birth: [birth year]

## 2020-12-11 ENCOUNTER — Ambulatory Visit: Payer: Medicaid Other | Admitting: Critical Care Medicine

## 2020-12-12 ENCOUNTER — Encounter: Payer: Self-pay | Admitting: Physical Therapy

## 2020-12-12 ENCOUNTER — Other Ambulatory Visit: Payer: Self-pay

## 2020-12-12 ENCOUNTER — Ambulatory Visit: Payer: Self-pay | Admitting: Physical Therapy

## 2020-12-12 DIAGNOSIS — R2689 Other abnormalities of gait and mobility: Secondary | ICD-10-CM

## 2020-12-12 DIAGNOSIS — M545 Low back pain, unspecified: Secondary | ICD-10-CM

## 2020-12-12 DIAGNOSIS — G8929 Other chronic pain: Secondary | ICD-10-CM

## 2020-12-12 DIAGNOSIS — M6281 Muscle weakness (generalized): Secondary | ICD-10-CM

## 2020-12-12 NOTE — Therapy (Signed)
[place], [place], [address] Phone: [phone]   Fax:  [phone]  Physical Therapy Treatment  Patient Details  Name: Sydney Williams MRN: [id number] Date of Birth: 09-21-1966 Referring Provider (PT): Jessy Oto, MD   Encounter Date: 12/12/2020   PT End of Session - 12/12/20 0826    Visit Number 6    Number of Visits 13    Date for PT Re-Evaluation 12/12/20    Authorization Type CAFA    PT Start Time 0830    PT Stop Time 0915    PT Time Calculation (min) 45 min    Activity Tolerance Patient tolerated treatment well    Behavior During Therapy [facility] for tasks assessed/performed           Past Medical History:  Diagnosis Date  . Asthma    2015 hospitaliation for asthma  . Diabetes mellitus    Type II  . Family history of breast cancer   . Fibroids 2010  . Genital herpes   . Genital herpes 11/30/2014  . GERD (gastroesophageal reflux disease)   . Hyperlipidemia   . Hypertension   . Impaired fasting blood sugar   . Migraines   . Obesity   . Right carpal tunnel syndrome   . Smoker     Past Surgical History:  Procedure Laterality Date  . CARDIAC CATHETERIZATION    . CARPAL TUNNEL RELEASE Right 08/30/2020   Procedure: RIGHT CARPAL TUNNEL RELEASE;  Surgeon: Leandrew Koyanagi, MD;  Location: [place];  Service: Orthopedics;  Laterality: Right;  . COLONOSCOPY  03/13/2016   Dr. Carlean Purl, normal, repeat 2027  . CYST EXCISION     left neck/postauricular region, benign  . DILATION AND CURETTAGE OF UTERUS    . LAPAROSCOPIC ABDOMINAL EXPLORATION     removal of ectopic preg  . LEFT HEART CATH AND CORONARY ANGIOGRAPHY N/A 06/23/2017   Procedure: LEFT HEART CATH AND CORONARY ANGIOGRAPHY;  Surgeon: Nelva Bush, MD;  Location: [place] [facility];  Service: Cardiovascular;  Laterality: N/A;  . TUBAL LIGATION      There were no vitals filed for this visit.   Subjective Assessment -  12/12/20 0832    Subjective Pt reported TENS helped and she has been doing it at home. She still has the radiating pain in the R leg, but it isn't as bad. Pt reports bilateral knee stiffness has been starting to bother her more and that she needs to talk to her doctor about it. Nothing brought it on that she can think of that started it. I have 2 scabs on my back from the surgery scar now that are too painful to lay on currently to do exercises or at night, the doctor says I just need to wait for them to fall off.    Currently in Pain? Yes    Pain Score 4     Pain Location Leg    Pain Orientation Right    Pain Descriptors / Indicators Burning;Aching    Pain Onset More than a month ago    Pain Frequency Constant                             [facility] Adult PT Treatment/Exercise - 12/12/20 0001      Self-Care   Self-Care Other Self-Care Comments    Other Self-Care Comments  education on the relationship between lumbar surgery and radicular sx  into the legs      Exercises   Exercises Lumbar      Lumbar Exercises: Stretches   Passive Hamstring Stretch 2 reps;30 seconds    Passive Hamstring Stretch Limitations sitting EOB    Piriformis Stretch 30 seconds;3 reps    Piriformis Stretch Limitations sitting EOB with foot crossed at shin    Other Lumbar Stretch Exercise standing quad stretch 2x30 seconds (more than 30 seconds and started getting numbness)      Lumbar Exercises: Aerobic   Nustep 5 minutes LE level 2 level 3      Lumbar Exercises: Seated   Long Arc Quad on Chair 2 sets;10 reps;Both    LAQ on Chair Weights (lbs) 4    LAQ on Chair Limitations focus on eccentric control    Sit to Stand 15 reps    Other Seated Lumbar Exercises Palloff press 2x12, red band    Other Seated Lumbar Exercises chest presses 2x15 5#                  PT Education - 12/12/20 1027    Education Details HEP update/exercises to do in sitting until lumbar scabs fall off    Person(s)  Educated Patient    Methods Explanation;Demonstration;Handout    Comprehension Verbalized understanding;Need further instruction            PT Short Term Goals - 11/24/20 0951      PT SHORT TERM GOAL #1   Title Patient will be I with initial HEP to progress with PT    Baseline pt reports compliance, and demonstrates independence.    Time 3    Period Weeks    Status Achieved    Target Date 11/21/20      PT SHORT TERM GOAL #2   Title PT with review FOTO with patient by 3rd visit    Time 3    Period Weeks    Status On-going    Target Date 11/21/20      PT SHORT TERM GOAL #3   Title Patient will be able to ambulate household distances without AD to improve independence    Baseline limited to 3-5 minutes    Time 3    Period Weeks    Status On-going    Target Date 11/21/20             PT Long Term Goals - 10/31/20 1116      PT LONG TERM GOAL #1   Title Patient will be I with final HEP to maintain progress from PT    Time 6    Period Weeks    Status New    Target Date 12/12/20      PT LONG TERM GOAL #2   Title Patient will be able to ambulate community level distances with LRAD to be able to grocery shop    Time 6    Period Weeks    Status New    Target Date 12/12/20      PT LONG TERM GOAL #3   Title Patient will demonstrate hip strength >/= 4/5 MMT and knee/ankle strength 5/5 MMT to improve walking and transfers    Time 6    Period Weeks    Status New    Target Date 12/12/20      PT LONG TERM GOAL #4   Title Patient will report </= 4/10 pain level with activity to reduce functional limitation    Time 6    Period Weeks  Status New    Target Date 12/12/20      PT LONG TERM GOAL #5   Title Patient will report improved functional status >/= 44% on FOTO    Time 6    Period Weeks    Status New    Target Date 12/12/20                 Plan - 12/12/20 1031    Clinical Impression Statement Pt tolerated PT well with no adverse effects. Pt unable  to perform supine exercises this session due to pain on surgical incision laying down secondary to scabbing, so this session focused on sitting/standing stretching and exercises. Standing quad stretch was done to try to stretch the quadriceps as well as the femoral nerve, which is causing radiating sx in the anterior thigh. Pt shows decreased eccentric control with functional activities such as sit to stands, as well as with LAQ's. Pt continues to benefit from PT in order to help decrease radicular sx, increase core and LE strength, as well as increase ROM in order to help decrease pain as well as increase function.    PT Treatment/Interventions ADLs/Self Care Home Management;Cryotherapy;Electrical Stimulation;Iontophoresis 4mg /ml Dexamethasone;Moist Heat;Ultrasound;Neuromuscular re-education;Balance training;Therapeutic exercise;Therapeutic activities;Functional mobility training;Stair training;Gait training;Patient/family education;Manual techniques;Dry needling;Passive range of motion;Taping;Spinal Manipulations;Joint Manipulations    PT Next Visit Plan Review HEP and progress PRN, progress core and LE strengthening abiding by precautions (progressing to standing as able), hip and LE stretching    PT Home Exercise Plan [id number]    Consulted and Agree with Plan of Care Patient           Patient will benefit from skilled therapeutic intervention in order to improve the following deficits and impairments:  Abnormal gait,Decreased range of motion,Difficulty walking,Decreased activity tolerance,Pain,Decreased balance,Impaired flexibility,Improper body mechanics,Postural dysfunction,Decreased strength  Visit Diagnosis: Chronic bilateral low back pain, unspecified whether sciatica present  Muscle weakness (generalized)  Other abnormalities of gait and mobility     Problem List Patient Active Problem List   Diagnosis Date Noted  . Degenerative disc disease, lumbar   . History of lumbar spinal  fusion 10/24/2020  . Seasonal and perennial allergic rhinoconjunctivitis 09/18/2020  . Carpal tunnel syndrome on right   . Seronegative rheumatoid arthritis ([place]) 08/04/2020  . Allergic conjunctivitis of both eyes 07/19/2020  . Lumbar radiculopathy, chronic 04/03/2020  . Elevated blood uric acid level 04/03/2020  . Family history of cervical cancer 02/02/2018  . Family history of breast cancer 02/02/2018  . Vitamin D deficiency 02/02/2018  . Depression 06/19/2017  . Hypomagnesemia 06/19/2017  . 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 Snoring 09/11/2016  . History of migraine 09/11/2016  . Frequent headaches 09/11/2016  . Moderate persistent asthma 06/01/2015  . Gastroesophageal reflux disease without esophagitis 06/01/2015  . Other allergic rhinitis 06/01/2015  . Tobacco user 11/30/2014  . Essential hypertension 11/30/2014  . Hyperlipidemia 11/30/2014  . Obesity 07/02/2012  . Type 2 diabetes mellitus without complication, without long-term current use of insulin ([place]) 07/02/2012    Samba Cumba 12/12/2020, 10:38 AM  [facility] [address] [place], [place], [address] Phone: [phone]   Fax:  [phone]  Name: JULLIANA WHITMYER MRN: [id number] Date of Birth: December 29, 1965

## 2020-12-14 ENCOUNTER — Ambulatory Visit: Payer: Self-pay | Admitting: Physical Therapy

## 2020-12-15 ENCOUNTER — Ambulatory Visit: Payer: Self-pay | Attending: Critical Care Medicine

## 2020-12-15 ENCOUNTER — Other Ambulatory Visit: Payer: Self-pay | Admitting: Allergy

## 2020-12-15 ENCOUNTER — Other Ambulatory Visit: Payer: Self-pay

## 2020-12-15 DIAGNOSIS — Z111 Encounter for screening for respiratory tuberculosis: Secondary | ICD-10-CM

## 2020-12-15 MED FILL — $TRELEGY 200-62.5-25: 200-62.5-25 | 30 days supply | Qty: 60 | Fill #0

## 2020-12-15 NOTE — Progress Notes (Signed)
Pt arrived at clinic for PPD testing. PPD was placed in pt left fore arm. Pt was informed to return Monday 12/18/20 to get test read.

## 2020-12-18 LAB — TB SKIN TEST
Induration: 0 mm
TB Skin Test: NEGATIVE

## 2020-12-19 ENCOUNTER — Ambulatory Visit: Payer: Self-pay | Admitting: Physical Therapy

## 2020-12-19 ENCOUNTER — Ambulatory Visit: Payer: Self-pay | Attending: Critical Care Medicine | Admitting: Critical Care Medicine

## 2020-12-19 ENCOUNTER — Other Ambulatory Visit: Payer: Self-pay

## 2020-12-19 ENCOUNTER — Encounter: Payer: Self-pay | Admitting: Physical Therapy

## 2020-12-19 ENCOUNTER — Encounter: Payer: Self-pay | Admitting: Critical Care Medicine

## 2020-12-19 ENCOUNTER — Telehealth: Payer: Self-pay

## 2020-12-19 ENCOUNTER — Other Ambulatory Visit: Payer: Self-pay | Admitting: Critical Care Medicine

## 2020-12-19 VITALS — BP 114/82 | HR 75 | Temp 98.3°F | Resp 18 | Ht 71.0 in | Wt 276.0 lb

## 2020-12-19 DIAGNOSIS — I10 Essential (primary) hypertension: Secondary | ICD-10-CM

## 2020-12-19 DIAGNOSIS — E790 Hyperuricemia without signs of inflammatory arthritis and tophaceous disease: Secondary | ICD-10-CM

## 2020-12-19 DIAGNOSIS — G8929 Other chronic pain: Secondary | ICD-10-CM

## 2020-12-19 DIAGNOSIS — A6000 Herpesviral infection of urogenital system, unspecified: Secondary | ICD-10-CM

## 2020-12-19 DIAGNOSIS — J4540 Moderate persistent asthma, uncomplicated: Secondary | ICD-10-CM

## 2020-12-19 DIAGNOSIS — E782 Mixed hyperlipidemia: Secondary | ICD-10-CM

## 2020-12-19 DIAGNOSIS — M0600 Rheumatoid arthritis without rheumatoid factor, unspecified site: Secondary | ICD-10-CM

## 2020-12-19 DIAGNOSIS — E119 Type 2 diabetes mellitus without complications: Secondary | ICD-10-CM

## 2020-12-19 DIAGNOSIS — R7301 Impaired fasting glucose: Secondary | ICD-10-CM

## 2020-12-19 DIAGNOSIS — K219 Gastro-esophageal reflux disease without esophagitis: Secondary | ICD-10-CM

## 2020-12-19 DIAGNOSIS — M5416 Radiculopathy, lumbar region: Secondary | ICD-10-CM

## 2020-12-19 DIAGNOSIS — Z720 Tobacco use: Secondary | ICD-10-CM

## 2020-12-19 DIAGNOSIS — M545 Low back pain, unspecified: Secondary | ICD-10-CM

## 2020-12-19 DIAGNOSIS — M6281 Muscle weakness (generalized): Secondary | ICD-10-CM

## 2020-12-19 DIAGNOSIS — R2689 Other abnormalities of gait and mobility: Secondary | ICD-10-CM

## 2020-12-19 DIAGNOSIS — R600 Localized edema: Secondary | ICD-10-CM

## 2020-12-19 LAB — POCT GLYCOSYLATED HEMOGLOBIN (HGB A1C): Hemoglobin A1C: 6.3 % — AB (ref 4.0–5.6)

## 2020-12-19 LAB — GLUCOSE, POCT (MANUAL RESULT ENTRY): POC Glucose: 93 mg/dl (ref 70–99)

## 2020-12-19 MED ORDER — AMLODIPINE BESYLATE 10 MG PO TABS: 10.0000 mg | ORAL_TABLET | Freq: Every day | ORAL | 0 refills | Status: DC

## 2020-12-19 MED ORDER — METFORMIN HCL 500 MG PO TABS: 500.0000 mg | ORAL_TABLET | Freq: Two times a day (BID) | ORAL | 1 refills | Status: DC

## 2020-12-19 MED ORDER — ACYCLOVIR 200 MG PO CAPS: 200.0000 mg | ORAL_CAPSULE | Freq: Two times a day (BID) | ORAL | 3 refills | Status: DC

## 2020-12-19 MED ORDER — FUROSEMIDE 20 MG PO TABS: 20.0000 mg | ORAL_TABLET | Freq: Every day | ORAL | 1 refills | Status: DC | PRN

## 2020-12-19 MED ORDER — METOPROLOL TARTRATE 50 MG PO TABS
50.0000 mg | ORAL_TABLET | Freq: Two times a day (BID) | ORAL | 2 refills | Status: DC
  Filled 2021-03-20: qty 60, 30d supply, fill #0
  Filled 2021-04-24: qty 60, 30d supply, fill #1

## 2020-12-19 MED ORDER — AMLODIPINE BESYLATE 10 MG PO TABS: 10.0000 mg | ORAL_TABLET | Freq: Every day | ORAL | 2 refills | Status: DC

## 2020-12-19 MED ORDER — ATORVASTATIN CALCIUM 40 MG PO TABS: 40.0000 mg | ORAL_TABLET | Freq: Every day | ORAL | 2 refills | Status: DC

## 2020-12-19 MED ORDER — HYDROCHLOROTHIAZIDE 25 MG PO TABS: 25.0000 mg | ORAL_TABLET | Freq: Every day | ORAL | 3 refills | Status: DC

## 2020-12-19 MED ORDER — ATORVASTATIN CALCIUM 40 MG PO TABS
40.0000 mg | ORAL_TABLET | Freq: Every day | ORAL | 2 refills | Status: DC
Start: 2020-12-19 — End: 2020-12-19

## 2020-12-19 MED FILL — ?ACYCLOVIR 200 MG CAPSU: 200 | 30 days supply | Qty: 60 | Fill #0

## 2020-12-19 MED FILL — METFORMIN HCL 500 MG TABS: 500 | 30 days supply | Qty: 60 | Fill #0

## 2020-12-19 MED FILL — ?METOPROLOL TART 50 MG TABL: 50 | 30 days supply | Qty: 60 | Fill #0

## 2020-12-19 MED FILL — ?FUROSEMIDE 20MG TABLET: 20 | 30 days supply | Qty: 30 | Fill #0

## 2020-12-19 NOTE — Assessment & Plan Note (Signed)
Hypertension well controlled at this time no change in medications will refill metoprolol and amlodipine

## 2020-12-19 NOTE — Assessment & Plan Note (Signed)
  .   Current smoking consumption amount: 1/2 pack a day  . Dicsussion on advise to quit smoking and smoking impacts: Cardiovascular lung impacts  . Patient's willingness to quit: Focused on quitting  . Methods to quit smoking discussed: Behavioral modification  . Medication management of smoking session drugs discussed: Not a candidate for nicotine replacement or Chantix  . Resources provided:  AVS   . Setting quit date not established  . Follow-up arranged 3 months   Time spent counseling the patient: 5 minutes

## 2020-12-19 NOTE — Telephone Encounter (Signed)
Sydney Williams from [facility] records  She needs a final pre op note from December 28th   [phone]

## 2020-12-19 NOTE — Assessment & Plan Note (Signed)
Refills given on atorvastatin continue same

## 2020-12-19 NOTE — Patient Instructions (Signed)
Discontinue gabapentin and baclofen  Refills on all your medicines sent to our pharmacy with 90-day supplies  No change in your diabetic and blood pressure medicines for now  Please obtain an eye exam this year  We will bring you back in 2 to 3 months to see one of my colleagues for a Pap smear  Focus on smoking cessation using behavioral modification techniques we discussed  Continue your exercise activity in your diet as you are currently doing as you are doing very well on your blood pressure and diabetes  Return to see Dr. Joya Gaskins 4 months   Tobacco Use Disorder Tobacco use disorder (TUD) occurs when a person craves, seeks, and uses tobacco, regardless of the consequences. This disorder can cause problems with mental and physical health. It can affect your ability to have healthy relationships, and it can keep you from meeting your responsibilities at work, home, or school. Tobacco may be:  Smoked as a cigarette or cigar.  Inhaled using e-cigarettes.  Smoked in a pipe or hookah.  Chewed as smokeless tobacco.  Inhaled into the nostrils as snuff. Tobacco products contain a dangerous chemical called nicotine, which is very addictive. Nicotine triggers hormones that make the body feel stimulated and works on areas of the brain that make you feel good. These effects can make it hard for people to quit nicotine. Tobacco contains many other unsafe chemicals that can damage almost every organ in the body. Smoking tobacco also puts others in danger due to fire risk and possible health problems caused by breathing in secondhand smoke. What are the signs or symptoms? Symptoms of TUD may include:  Being unable to slow down or stop your tobacco use.  Spending an abnormal amount of time getting or using tobacco.  Craving tobacco. Cravings may last for up to 6 months after quitting.  Tobacco use that: ? Interferes with your work, school, or home life. ? Interferes with your personal  and social relationships. ? Makes you give up activities that you once enjoyed or found important.  Using tobacco even though you know that it is: ? Dangerous or bad for your health or someone else's health. ? Causing problems in your life.  Needing more and more of the substance to get the same effect (developing tolerance).  Experiencing unpleasant symptoms if you do not use the substance (withdrawal). Withdrawal symptoms may include: ? Depressed, anxious, or irritable mood. ? Difficulty concentrating. ? Increased appetite. ? Restlessness or trouble sleeping.  Using the substance to avoid withdrawal. How is this diagnosed? This condition may be diagnosed based on:  Your current and past tobacco use. Your health care provider may ask questions about how your tobacco use affects your life.  A physical exam. You may be diagnosed with TUD if you have at least two symptoms within a 54-month period. How is this treated? This condition is treated by stopping tobacco use. Many people are unable to quit on their own and need help. Treatment may include:  Nicotine replacement therapy (NRT). NRT provides nicotine without the other harmful chemicals in tobacco. NRT gradually lowers the dosage of nicotine in the body and reduces withdrawal symptoms. NRT is available as: ? Over-the-counter gums, lozenges, and skin patches. ? Prescription mouth inhalers and nasal sprays.  Medicine that acts on the brain to reduce cravings and withdrawal symptoms.  A type of talk therapy that examines your triggers for tobacco use, how to avoid them, and how to cope with cravings (behavioral therapy).  Hypnosis. This may help with withdrawal symptoms.  Joining a support group for others coping with TUD. The best treatment for TUD is usually a combination of medicine, talk therapy, and support groups. Recovery can be a long process. Many people start using tobacco again after stopping (relapse). If you relapse,  it does not mean that treatment will not work. Follow these instructions at home: Lifestyle  Do not use any products that contain nicotine or tobacco, such as cigarettes and e-cigarettes.  Avoid things that trigger tobacco use as much as you can. Triggers include people and situations that usually cause you to use tobacco.  Avoid drinks that contain caffeine, including coffee. These may worsen some withdrawal symptoms.  Find ways to manage stress. Wanting to smoke may cause stress, and stress can make you want to smoke. Relaxation techniques such as deep breathing, meditation, and yoga may help.  Attend support groups as needed. These groups are an important part of long-term recovery for many people. General instructions  Take over-the-counter and prescription medicines only as told by your health care provider.  Check with your health care provider before taking any new prescription or over-the-counter medicines.  Decide on a friend, family member, or smoking quit-line (such as 1-800-QUIT-NOW in the [place] that you can call or text when you feel the urge to smoke or when you need help coping with cravings.  Keep all follow-up visits as told by your health care provider and therapist. This is important.   Contact a health care provider if:  You are not able to take your medicines as prescribed.  Your symptoms get worse, even with treatment. Summary  Tobacco use disorder (TUD) occurs when a person craves, seeks, and uses tobacco regardless of the consequences.  This condition may be diagnosed based on your current and past tobacco use and a physical exam.  Many people are unable to quit on their own and need help. Recovery can be a long process.  The most effective treatment for TUD is usually a combination of medicine, talk therapy, and support groups. This information is not intended to replace advice given to you by your health care provider. Make sure you discuss any questions  you have with your health care provider. Document Revised: 10/01/2017 Document Reviewed: 10/01/2017 Elsevier Patient Education  2021 [employer].

## 2020-12-19 NOTE — Assessment & Plan Note (Signed)
History of lumbar radiculopathy status post lumbar surgery per orthopedic spine with successful result

## 2020-12-19 NOTE — Therapy (Addendum)
[place], [place], [address] Phone: [phone]   Fax:  [phone]  Physical Therapy Treatment / Discharge  Patient Details  Name: Sydney Williams MRN: [id number] Date of Birth: [birth year] Referring Provider (PT): Jessy Oto, MD   Encounter Date: 12/19/2020   PT End of Session - 12/19/20 0913    Visit Number 7    Number of Visits 13    Date for PT Re-Evaluation 01/30/21    Authorization Type CAFA    PT Start Time 0830    PT Stop Time 0913    PT Time Calculation (min) 43 min    Equipment Utilized During Treatment Back brace    Activity Tolerance Patient tolerated treatment well    Behavior During Therapy [facility] for tasks assessed/performed           Past Medical History:  Diagnosis Date  . Asthma    2015 hospitaliation for asthma  . Diabetes mellitus    Type II  . Family history of breast cancer   . Fibroids 2010  . Genital herpes   . Genital herpes 11/30/2014  . GERD (gastroesophageal reflux disease)   . Hyperlipidemia   . Hypertension   . Impaired fasting blood sugar   . Migraines   . Obesity   . Right carpal tunnel syndrome   . Smoker     Past Surgical History:  Procedure Laterality Date  . CARDIAC CATHETERIZATION    . CARPAL TUNNEL RELEASE Right 08/30/2020   Procedure: RIGHT CARPAL TUNNEL RELEASE;  Surgeon: Leandrew Koyanagi, MD;  Location: [place];  Service: Orthopedics;  Laterality: Right;  . COLONOSCOPY  03/13/2016   Dr. Carlean Purl, normal, repeat 2027  . CYST EXCISION     left neck/postauricular region, benign  . DILATION AND CURETTAGE OF UTERUS    . LAPAROSCOPIC ABDOMINAL EXPLORATION     removal of ectopic preg  . LEFT HEART CATH AND CORONARY ANGIOGRAPHY N/A 06/23/2017   Procedure: LEFT HEART CATH AND CORONARY ANGIOGRAPHY;  Surgeon: Nelva Bush, MD;  Location: [place] [facility];  Service: Cardiovascular;  Laterality: N/A;  . TUBAL LIGATION      There were  no vitals filed for this visit.   Subjective Assessment - 12/19/20 0833    Subjective Pt reports she still has 2 scaps on her back, so laying down hurts. She has a doctor appointment, not the surgeon, after this. I stopped doing the standing front thigh stretch, it was making my sx worse and I felt off balance.    Currently in Pain? Yes    Pain Score 3     Pain Location Leg    Pain Orientation Right    Pain Descriptors / Indicators Burning;Tightness;Aching    Pain Score 0    Pain Location Back    Pain Orientation Right;Left                             [facility] Adult PT Treatment/Exercise - 12/19/20 0001      Lumbar Exercises: Stretches   Passive Hamstring Stretch 2 reps;30 seconds    Passive Hamstring Stretch Limitations sitting EOB    Piriformis Stretch 30 seconds;3 reps    Piriformis Stretch Limitations sitting EOB with foot crossed at shin      Lumbar Exercises: Aerobic   Nustep 4 min LE level 5      Lumbar Exercises: Standing   Row --  2x10   Theraband Level (Row) Level 1 (Yellow)    Shoulder Extension Both   2x10   Other Standing Lumbar Exercises Palloff press yellow x10 each side      Lumbar Exercises: Seated   Long Arc Quad on Chair 2 sets;Both;Strengthening;Other (comment)   12 reps   LAQ on Chair Weights (lbs) 5    LAQ on Chair Limitations focus on eccentric control    Sit to Stand Other (comment)   2 sets x 8 reps each side one leg back, too much stress on knees and back, d/c and go back to regular sit to stands   Other Seated Lumbar Exercises chest presses 2x15 5#                  PT Education - 12/19/20 1025    Education Details HEP update on stretches    Person(s) Educated Patient    Methods Explanation;Demonstration;Handout    Comprehension Verbalized understanding;Need further instruction            PT Short Term Goals - 12/19/20 1028      PT SHORT TERM GOAL #1   Title Patient will be I with initial HEP to progress with PT     Baseline pt reports compliance, and demonstrates independence.    Time 3    Period Weeks    Status Achieved    Target Date 11/21/20      PT SHORT TERM GOAL #2   Title PT with review FOTO with patient by 3rd visit    Baseline not reviewed this sessoin    Time 3    Period Weeks    Status On-going    Target Date 11/21/20      PT SHORT TERM GOAL #3   Title Patient will be able to ambulate household distances without AD to improve independence    Status Achieved             PT Long Term Goals - 12/19/20 1030      PT LONG TERM GOAL #1   Title Patient will be I with final HEP to maintain progress from PT    Time 6    Period Weeks    Status On-going    Target Date 01/30/21      PT LONG TERM GOAL #2   Title Patient will be able to ambulate community level distances with LRAD to be able to grocery shop    Time 6    Period Weeks    Status On-going    Target Date 01/30/21      PT LONG TERM GOAL #3   Title Patient will demonstrate hip strength >/= 4/5 MMT and knee/ankle strength 5/5 MMT to improve walking and transfers    Baseline not assessed this session    Time 6    Period Weeks    Status On-going    Target Date 01/30/21      PT LONG TERM GOAL #4   Title Patient will report </= 4/10 pain level with activity to reduce functional limitation    Baseline 4-5/10 pain with lumbar exercises    Time 6    Period Weeks    Status On-going    Target Date 01/30/21      PT LONG TERM GOAL #5   Title Patient will report improved functional status >/= 44% on FOTO    Baseline FOTO not assessed this session    Time 6    Period Weeks  Status On-going    Target Date 01/30/21                 Plan - 12/19/20 1032    Clinical Impression Statement Pt tolerated PT session well with no adverse effects. Pt still unable to perform supine exercises secondary to surgical incisional pain when laying down from scabbing, so all exercises were done in sitting/standing. Core  stabilization exercises noted to increase lumbar tightness by end of sesion, so hip stretching was implemented with positive efffect. Pt showed increased difficulty in standing exercises compared to sitting exercises secondary to increasing degrees of freedom. Still showing reliance on UE for functional activities such as sit to stands. Pt continues to benefit form PT in order to increase functional strength, decrease radicular sx, and increasing LE ROM in order to help decrease pain and increase functional ability.    Personal Factors and Comorbidities Fitness;Comorbidity 3+;Past/Current Experience;Social Background;Finances;Transportation    Comorbidities BMI, DM II, tobacco use, depression    Examination-Activity Limitations Locomotion Level;Transfers;Bed Mobility;Bend;Sleep;Carry;Dressing;Hygiene/Grooming;Lift;Stand;Stairs;Squat;Bathing    Examination-Participation Restrictions Meal Prep;Cleaning;Occupation;Community Activity;Driving;Shop;Laundry;Yard Work    [employer] Evolving/Moderate complexity    Clinical Decision Making Moderate    Rehab Potential Fair    PT Frequency 1x / week    PT Duration 6 weeks    PT Treatment/Interventions ADLs/Self Care Home Management;Cryotherapy;Electrical Stimulation;Iontophoresis 41m/ml Dexamethasone;Moist Heat;Ultrasound;Neuromuscular re-education;Balance training;Therapeutic exercise;Therapeutic activities;Functional mobility training;Stair training;Gait training;Patient/family education;Manual techniques;Dry needling;Passive range of motion;Taping;Spinal Manipulations;Joint Manipulations    PT Next Visit Plan FOTO, assess hip/knee/ankle strength, reassess long term goals, progress core and LE strengthening PRN, hip and LE stretching progression    PT Home Exercise Plan [id number]: push/pull stretches    Consulted and Agree with Plan of Care Patient           Patient will benefit from skilled therapeutic intervention in order to  improve the following deficits and impairments:  Abnormal gait,Decreased range of motion,Difficulty walking,Decreased activity tolerance,Pain,Decreased balance,Impaired flexibility,Improper body mechanics,Postural dysfunction,Decreased strength  Visit Diagnosis: Chronic bilateral low back pain, unspecified whether sciatica present  Muscle weakness (generalized)  Other abnormalities of gait and mobility     Problem List Patient Active Problem List   Diagnosis Date Noted  . Degenerative disc disease, lumbar   . History of lumbar spinal fusion 10/24/2020  . Seasonal and perennial allergic rhinoconjunctivitis 09/18/2020  . Carpal tunnel syndrome on right   . Seronegative rheumatoid arthritis ([facility]ache 08/04/2020  . Allergic conjunctivitis of both eyes 07/19/2020  . Lumbar radiculopathy, chronic 04/03/2020  . Elevated blood uric acid level 04/03/2020  . Family history of cervical cancer 02/02/2018  . Family history of breast cancer 02/02/2018  . Vitamin D deficiency 02/02/2018  . Depression 06/19/2017  . Hypomagnesemia 06/19/2017  . 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 Snoring 09/11/2016  . History of migraine 09/11/2016  . Frequent headaches 09/11/2016  . Moderate persistent asthma 06/01/2015  . Gastroesophageal reflux disease without esophagitis 06/01/2015  . Other allergic rhinitis 06/01/2015  . Tobacco user 11/30/2014  . Essential hypertension 11/30/2014  . Hyperlipidemia 11/30/2014  . Obesity 07/02/2012  . Type 2 diabetes mellitus without complication, without long-term current use of insulin ([place] 07/02/2012    Kileigh Ortmann 12/19/2020, 10:41 AM  [place] [address]Phone: [phone]  Fax:  [phone] Name: Sydney PETRENKOMRN: [id number] of Birth: [birth year]  PHYSICAL THERAPY DISCHARGE SUMMARY  Visits from Start of Care: 7  Current functional level  related  to goals / functional outcomes: See above   Remaining deficits: See above   Education / Equipment: HEP  Plan:                                                    Patient goals were not met. Patient is being discharged due to not returning since the last visit.  ?????    Hilda Blades, PT, DPT, LAT, ATC 01/11/21  10:37 AM Phone: [phone] Fax: [phone]

## 2020-12-19 NOTE — Telephone Encounter (Signed)
Printed for Dr. Nitka ? ?

## 2020-12-19 NOTE — Assessment & Plan Note (Signed)
A1c at goal at 6.3 continue Metformin

## 2020-12-19 NOTE — Assessment & Plan Note (Signed)
Reflux disease well controlled at this time no changes

## 2020-12-19 NOTE — Progress Notes (Signed)
Subjective:    Patient ID: Sydney Williams, female    DOB: 03/24/1966, 55 y.o.   MRN: [id number]  55 y.o.F hx HTN asthma obesity, smoker, HL  This is a follow-up primary care visit and actually patient was seen previously in May for a post ER visit for left shoulder and hand pain.  It turns out this patient's had chronic low back pain and has been to orthopedics and has had MRIs done has been offered surgery but she is awaiting financial assistance as she does not have any active insurance at this time.  Note this patient's been using a set of crutches since November because of her back.  She noted more recently the left axilla was becoming more irritated causing numbness in the upper arm all the way down to the hand with pain in the hand itself.  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 Louanne Skye.  Patient comes in today for cardiac and medical clearance for planned surgery.  She does have moderate persistent asthma and history of diastolic cardiac dysfunction from severe hypertension.  She had a cardiac cath in 2018 showed no coronary abnormalities.  She history of elevated uric acid and is on allopurinol and vitamin D deficiency on vitamin D supplementation.  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  Hypertension has been very well controlled on beta-blocker, amlodipine,, And hydrochlorthiazide The patient also has moderate persistent asthma and is on Advair inhaler and has had no recent exacerbations.  She also maintains a baby aspirin daily.  Patient does have diabetes type 2  and is on the Metformin daily.  Recent hemoglobin A1c was 6.4  The patient's had no active issues of chest pain or significant shortness of breath.  He does complain of bilateral hand pain that is worsening.  He does not have a walker in order to ambulate with her back.  She is using Tylenol extra strength and baclofen to control her pain.  Tramadol was noted no use.  She cannot take oxycodone due to allergies.  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  She is due up a mammogram.  She also would like an allergy referral in network for her asthma.  She continues to have dental issues and has in fact gone to dental care which have improved her issues and this was covered by her orange card.  Note on arrival blood glucose was 96  09/06/2020 Sydney Williams returns today for primary care follow-up.  Note the patient was planning to have lumbar surgery but had to delay this for other reasons.  She does maintain her Metformin twice daily and on arrival hemoglobin A1c was 6.4.  The patient maintains allopurinol for uric acid levels that are elevated.  The patient has quit smoking at this time.  Her asthma appears to be stable at this time as well.  The patient states she stopped the Cymbalta because it caused brain fog.  She is yet to receive her mammogram.  She saw allergy and had multiple multiple  skin test positive for grass and weeds and tree pollen.  There were no food allergies.  She was started on Singulair but this caused side effects so she stopped this medication.  She is on the Flonase and Claritin.  Maintains her Trelegy inhaler daily.  At the last visit her microalbumin levels were normal. The patient does maintain atorvastatin for cholesterol.  Since the last visit the patient did have carpal tunnel release on the right hand and is recovering from this and is planning surgery in the left hand as well in the future.  She does work as a Quarry manager  The patient  is also been seen by rheumatology because of multiple joint pain and is been diagnosed seronegative rheumatoid arthritis.  The patient is being considered for methotrexate but she is reluctant to receive this.  If the patient does need a DMARD treatment we could potentially give her patient assistance through our clinic since she is self-pay for any potential medication the rheumatologist desires  12/19/2020 Patient seen in return follow-up and had lumbar back surgery late last year and is recovering from this and has received physical therapy as well.  Patient still wears a back brace but is more ambulatory.  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  On arrival A1c was 6.3 and glucose was 93.  She has been compliant with her Metformin note on arrival blood pressure 114/82 she has been more consistent with her blood pressure medications.  She also states her breathing is better with regards to her asthma and allergist has her now on Trelegy and albuterol and doing well with this. The patient also no longer is taking any of the gabapentin baclofen or Robaxin Patient is on Tylenol 3 and is no longer on hydrocodone   Past Medical History:  Diagnosis Date  . Asthma    2015 hospitaliation for asthma  . Diabetes mellitus    Type II  . Family history of breast cancer   . Fibroids 2010  . Genital herpes   . Genital herpes 11/30/2014  . GERD (gastroesophageal reflux disease)   . Hyperlipidemia   . Hypertension   . Impaired fasting blood sugar   . Migraines   . Obesity   . Right carpal tunnel syndrome   . Smoker      Family History  Problem Relation Age of Onset  . Diabetes Mother   . Hypertension Mother   . Aneurysm Mother   . Stroke Mother   . Cancer Mother        cervical cancer  . Cancer Maternal Aunt        breast  . Cancer Cousin        breast/breast  . Lupus Cousin   . Heart disease Neg Hx   . Colon cancer Neg Hx   .  Allergic rhinitis Neg Hx   . Angioedema Neg Hx   . Asthma Neg Hx   . Atopy Neg Hx   . Eczema Neg Hx   . Immunodeficiency Neg Hx   . Urticaria Neg Hx      Social History   Socioeconomic History  . Marital status: Legally Separated    Spouse name: Not on file  . Number of children: Not on file  . Years of education: Not on file  . Highest education level: Not on file  Occupational History  . Not on file  Tobacco Use  . Smoking  status: Current Every Day Smoker    Packs/day: 0.25    Years: 14.00    Pack years: 3.50    Types: Cigarettes  . Smokeless tobacco: Never Used  Vaping Use  . Vaping Use: Never used  Substance and Sexual Activity  . Alcohol use: Yes    Comment: occ  . Drug use: No  . Sexual activity: Yes    Birth control/protection: Surgical  Other Topics Concern  . Not on file  Social History Narrative   Married, and her granddaughter lives with her, works as Quarry manager at [employer], exercise - activity at work, walking.  12/2016   Social Determinants of Health   Financial Resource Strain: Not on file  Food Insecurity: Not on file  Transportation Needs: Not on file  Physical Activity: Not on file  Stress: Not on file  Social Connections: Not on file  Intimate Partner Violence: Not on file     Allergies  Allergen Reactions  . Lisinopril Swelling  . Potassium Chloride Shortness Of Breath and Swelling    Tolerates IV KCl, reaction only to PO product  . Pneumococcal Vaccines Swelling and Other (See Comments)    Reaction:  Swelling at injection site  . Vicodin [Hydrocodone-Acetaminophen] Itching and Rash     Outpatient Medications Prior to Visit  Medication Sig Dispense Refill  . acetaminophen (TYLENOL) 500 MG tablet Take 1,000 mg by mouth every 6 (six) hours as needed for moderate pain.    Marland Kitchen acetaminophen-codeine (TYLENOL #3) 300-30 MG tablet Take 1 tablet by mouth in the morning and at bedtime.    Marland Kitchen albuterol (VENTOLIN HFA) 108 (90 Base) MCG/ACT inhaler  INHALE 1-2 PUFFS INTO THE LUNGS EVERY 6 (SIX) HOURS AS NEEDED FOR WHEEZING OR SHORTNESS OF BREATH. 18 g 0  . ascorbic acid (VITAMIN C) 500 MG tablet Take 500 mg by mouth daily.    Marland Kitchen aspirin (EQ ASPIRIN ADULT LOW DOSE) 81 MG EC tablet Take 1 tablet (81 mg total) by mouth daily. Swallow whole. 90 tablet 3  . diphenhydrAMINE (BENADRYL) 25 MG tablet Take 1 tablet (25 mg total) by mouth every 6 (six) hours as needed. (Patient taking differently: Take 25 mg by mouth every 6 (six) hours as needed for allergies.) 30 tablet 0  . omeprazole (PRILOSEC) 40 MG capsule TAKE 1 CAPSULE (40 MG TOTAL) BY MOUTH 2 (TWO) TIMES DAILY. TO DECREASE STOMACH ACID 60 capsule 1  . TRELEGY ELLIPTA 200-62.5-25 MCG/INH AEPB INHALE 1 PUFF INTO THE LUNGS DAILY. RINSE MOUTH AFTER EACH USE. 60 each 0  . Vitamin D, Cholecalciferol, 25 MCG (1000 UT) TABS Take 1 tablet by mouth once daily 90 tablet 0  . acyclovir (ZOVIRAX) 200 MG capsule Take 1 capsule (200 mg total) by mouth 2 (two) times daily. 180 capsule 3  . allopurinol (ZYLOPRIM) 100 MG tablet TAKE 1 TABLET (100 MG TOTAL) BY MOUTH DAILY. 30 tablet 0  . amLODipine (NORVASC) 10 MG tablet TAKE 1 TABLET (10 MG TOTAL) BY MOUTH DAILY. TO LOWER BLOOD PRESSURE 30 tablet 0  . atorvastatin (LIPITOR) 40 MG tablet TAKE 1 TABLET (40 MG TOTAL) BY MOUTH DAILY. 30 tablet 2  . Cholecalciferol (VITAMIN D) 50 MCG (2000 UT) tablet Take 2,000 Units by mouth daily.    . furosemide (LASIX) 20 MG tablet One pill once per day if needed for swelling (Patient taking differently: Take 20 mg by mouth daily as needed for edema.) 20 tablet 0  . gabapentin (NEURONTIN) 100 MG capsule 1 in the morning,  1 at night (Patient taking differently: Take 100 mg by mouth 3 (three) times daily.) 180 capsule 3  . gabapentin (NEURONTIN) 100 MG capsule Take 1 capsule (100 mg total) by mouth 3 (three) times daily. 90 capsule 3  . hydrochlorothiazide (HYDRODIURIL) 25 MG tablet Take 1 tablet (25 mg total) by mouth daily. To lower  blood pressure 90 tablet 3  . metFORMIN (GLUCOPHAGE) 500 MG tablet Take 1 tablet (500 mg total) by mouth 2 (two) times daily with a meal. 90 tablet 1  . metoprolol tartrate (LOPRESSOR) 50 MG tablet TAKE 1 TABLET (50 MG TOTAL) BY MOUTH 2 (TWO) TIMES DAILY. 60 tablet 2  . baclofen (LIORESAL) 10 MG tablet TAKE 1-2 TABLETS (10-20 MG TOTAL) BY MOUTH 3 (THREE) TIMES DAILY AS NEEDED FOR MUSCLE SPASMS. (Patient not taking: Reported on 12/19/2020) 90 tablet 1  . docusate sodium (COLACE) 100 MG capsule Take 1 capsule (100 mg total) by mouth 2 (two) times daily. (Patient not taking: Reported on 12/19/2020) 40 capsule 0  . methocarbamol (ROBAXIN) 500 MG tablet Take 1 tablet (500 mg total) by mouth every 6 (six) hours as needed for muscle spasms. (Patient not taking: Reported on 12/19/2020) 30 tablet 1  . oxyCODONE (OXY IR/ROXICODONE) 5 MG immediate release tablet Take 1 tablet (5 mg total) by mouth every 4 (four) hours as needed for moderate pain or breakthrough pain ((score 4 to 6)). (Patient not taking: Reported on 12/19/2020) 40 tablet 0  . oxyCODONE-acetaminophen (PERCOCET/ROXICET) 5-325 MG tablet Take 1-2 tablets by mouth every 4 (four) hours as needed for severe pain. (Patient not taking: Reported on 12/19/2020) 40 tablet 0   No facility-administered medications prior to visit.     Review of Systems  Constitutional: Negative for activity change.  HENT: Negative.   Respiratory: Negative.   Cardiovascular: Negative.   Gastrointestinal: Negative.   Musculoskeletal: Negative for back pain and gait problem.       Objective:   Physical Exam Vitals:   12/19/20 0929  BP: 114/82  Pulse: 75  Resp: 18  Temp: 98.3 F (36.8 C)  TempSrc: Oral  SpO2: 96%  Weight: 276 lb (125.2 kg)  Height: 5\' 11"  (1.803 m)    Gen: Pleasant, obese, in no distress,  normal affect  ENT: No lesions,  mouth clear,  oropharynx clear, no postnasal drip  Neck: No JVD, no TMG, no carotid bruits  Lungs: No use of accessory  muscles, no dullness to percussion, clear without rales or rhonchi  Cardiovascular: RRR, heart sounds normal, no murmur or gallops, no peripheral edema  Abdomen: soft and NT, no HSM,  BS normal  Musculoskeletal: No deformities, no cyanosis or clubbing, patient wearing a back brace comfortably  Neuro: alert, non focal,  Skin: Warm, no lesions or rashes  Foot exam was normal      Assessment & Plan:  I personally reviewed all images and lab data in the [facility] system as well as any outside material available during this office visit and agree with the  radiology impressions.   Essential hypertension Hypertension well controlled at this time no change in medications will refill metoprolol and amlodipine  Moderate persistent asthma Moderate persistent asthma well controlled and managed by allergist continue current inhaled medications  Gastroesophageal reflux disease without esophagitis Reflux disease well controlled at this time no changes  Type 2 diabetes mellitus without complication, without long-term current use of insulin (HCC) A1c at goal at 6.3 continue Metformin  Lumbar radiculopathy, chronic History of lumbar radiculopathy status  post lumbar surgery per orthopedic spine with successful result  Elevated blood uric acid level Prior history of elevated uric acid however no evidence of active gout will discontinue allopurinol  Hyperlipidemia Refills given on atorvastatin continue same  Tobacco user    . Current smoking consumption amount: 1/2 pack a day  . Dicsussion on advise to quit smoking and smoking impacts: Cardiovascular lung impacts  . Patient's willingness to quit: Focused on quitting  . Methods to quit smoking discussed: Behavioral modification  . Medication management of smoking session drugs discussed: Not a candidate for nicotine replacement or Chantix  . Resources provided:  AVS   . Setting quit date not established  . Follow-up arranged 3  months   Time spent counseling the patient: 5 minutes     Sydney Williams was seen today for follow-up.  Diagnoses and all orders for this visit:  Type 2 diabetes mellitus without complication, without long-term current use of insulin (HCC) -     Glucose (CBG) -     HgB A1c -     Discontinue: metFORMIN (GLUCOPHAGE) 500 MG tablet; Take 1 tablet (500 mg total) by mouth 2 (two) times daily with a meal. -     metFORMIN (GLUCOPHAGE) 500 MG tablet; Take 1 tablet (500 mg total) by mouth 2 (two) times daily with a meal.  Genital herpes simplex, unspecified site -     acyclovir (ZOVIRAX) 200 MG capsule; Take 1 capsule (200 mg total) by mouth 2 (two) times daily.  Essential hypertension -     Discontinue: amLODipine (NORVASC) 10 MG tablet; Take 1 tablet (10 mg total) by mouth daily. To lower blood pressure -     hydrochlorothiazide (HYDRODIURIL) 25 MG tablet; Take 1 tablet (25 mg total) by mouth daily. To lower blood pressure -     metoprolol tartrate (LOPRESSOR) 50 MG tablet; Take 1 tablet (50 mg total) by mouth 2 (two) times daily. -     amLODipine (NORVASC) 10 MG tablet; Take 1 tablet (10 mg total) by mouth daily. To lower blood pressure  Mild peripheral edema -     furosemide (LASIX) 20 MG tablet; Take 1 tablet (20 mg total) by mouth daily as needed for edema.  Impaired fasting blood sugar -     Discontinue: metFORMIN (GLUCOPHAGE) 500 MG tablet; Take 1 tablet (500 mg total) by mouth 2 (two) times daily with a meal. -     metFORMIN (GLUCOPHAGE) 500 MG tablet; Take 1 tablet (500 mg total) by mouth 2 (two) times daily with a meal.  Moderate persistent asthma without complication  Gastroesophageal reflux disease without esophagitis  Lumbar radiculopathy, chronic  Elevated blood uric acid level  Mixed hyperlipidemia  Tobacco user  Other orders -     Discontinue: atorvastatin (LIPITOR) 40 MG tablet; Take 1 tablet (40 mg total) by mouth daily. -     atorvastatin (LIPITOR) 40 MG tablet; Take  1 tablet (40 mg total) by mouth daily.  Patient is fully vaccinated for COVID with her Pfizer vaccine series including booster Once the back heals up further we will have the patient undertake her Pap smear which is due this year

## 2020-12-19 NOTE — Assessment & Plan Note (Signed)
Prior history of elevated uric acid however no evidence of active gout will discontinue allopurinol

## 2020-12-19 NOTE — Assessment & Plan Note (Signed)
Moderate persistent asthma well controlled and managed by allergist continue current inhaled medications

## 2020-12-20 NOTE — Op Note (Signed)
10/24/2020  6:43 PM  PATIENT:  Sydney Williams  55 y.o. female  MRN: [id number]  OPERATIVE REPORT  PRE-OPERATIVE DIAGNOSIS:  lumbar degenerative disc disease, lumbar disc herniation, lumbar spinal stenosis L2-3, L3-4, L4-5  POST-OPERATIVE DIAGNOSIS:  lumbar degenerative disc disease, lumbar disc herniation, lumbar spinal stenosis L2-3, L3-4, L4-5  PROCEDURE:  Procedure(s): LEFT LUMBAR TWO-THREE, LUMBAR THREE-FOUR AND LUMBAR FOUR- FIVE TRANSFORAMINAL LUMBAR INTERBODY FUSIONS WITH PEDICLE SCREWS, RODS, THREE CAGES, LOCAL BONE GRAFT, ALLOGRAFT BONE GRAFT AND VIVIGEN    SURGEON:  Jessy Oto, MD     ASSISTANT:  Benjiman Core, PA-C  (Present throughout the entire procedure and necessary for completion of procedure in a timely manner)     ANESTHESIA:  General,supplemented with local marcaine 0.5% 1:1 exparel 1.3% total 30cc, Dr. Lissa Hoard.  EBL: 600CC  CELL SAVER BLOOD RETURNED: 200CC  DRAINS: Foley to SD.     COMPLICATIONS:  None.     COMPONENTS:  Implant Name Type Inv. Item Serial No. Manufacturer Lot No. LRB No. Used Action  BONE VIVIGEN FORMABLE 5.4CC - [id number] Bone Implant BONE VIVIGEN FORMABLE 5.4CC [id number] [employer]  N/A 1 Implanted  BONE VIVIGEN FORMABLE 10CC - [id number] Bone Implant BONE VIVIGEN FORMABLE 10CC [id number] [place]  N/A 1 Implanted  BONE CANC CHIPS 20CC - [id number] Bone Implant BONE CANC CHIPS 20CC [id number] [employer]  N/A 1 Implanted  CAGE SABLE 10X30 9-16 8D - [id number] Cage CAGE SABLE 10X30 9-16 [place] [id number] N/A 1 Implanted  CAGE SABLE 10X30 9-16 8D - [id number] Cage CAGE SABLE 10X30 9-16 8D  [employer] [id number] N/A 1 Implanted  CAGE SABLE 10X30 6-12 8D - [id number] Cage CAGE SABLE 10X30 6-12 8D  [employer] [id number] N/A 1 Implanted  TULIP CREP AMP 5.5MM - [id number] Orthopedic Implant TULIP CREP AMP 5.5MM  [employer]  N/A 8 Implanted  SCREW SPINAL 7.5X50MM CREO AMP - [id number] Screw SCREW SPINAL  7.5X50MM CREO AMP  [employer]  N/A 8 Implanted  CAP LOCKING THREADED - [id number] Cap CAP LOCKING THREADED  [employer]  N/A 8 Implanted  TULIP CREP AMP 5.5MM - [id number] Orthopedic Implant TULIP CREP AMP 5.5MM  [employer]  N/A 3 Implanted and Explanted  ROD CURVED 5.5MMX90MM TITAN - [id number] Rod ROD CURVED 5.5MMX90MM TITAN  [employer]  N/A 1 Implanted and Explanted  ROD SPINAL CREO 95MM - [id number] Rod ROD SPINAL CREO 95MM  [employer]  N/A 1 Implanted  ROD CREO 100MM SPINAL - [id number] Rod ROD CREO 100MM SPINAL  [employer]  N/A 1 Implanted     PROCEDURE: The patient was met in the holding area, and the appropriate lumbar levels identified and marked with an "X" and my initials. I had discussion with the patient in the preop holding area regarding a change of consent form.The fusion levels are reidentified as L2-3, L3-4 and L4-5. Patient understands the rationale to perform TLIFs at three levels to decompress the left L3-4 and L4-5 lateral recess and foramenal stenosis and to allow for some improved correction of her overall sagitall alignment but mainly to treat chronic degenerative disc disease. The patient was then transported to OR and was placed under general anesthetic without difficulty. The patient received appropriate preoperative antibiotic prophylaxis Ancef.  Nursing staff inserted a Foley catheter under sterile conditions. The patient was then turned to a prone position using the [place] spine frame. PAS. all pressure points well padded the arms at the side to 90 90. Standard prep with  DuraPrep solution draped in the usual manner from the lower dorsal spine the mid sacral segment. Iodine Vi-Drape was used and the old incision scar was marked. Time-out procedure was called and correct. Skin in the midline between L1 and S1 was then infiltrated with Marcaine 0.5% 1:1 Exparel 1.3% total of 20 cc used. Incision was then made in the midline extending from L1-S2 through the  skin and subcutaneous layers down to the patient's lumbodorsal fascia and spinous processes. The incision then carried sharply lateral to  the supraspinous ligament and then continuing the lateral aspect of the spinous processes of L2-3, L3-4 and  L4-L5. Cobb elevator used to carefully elevate the paralumbar muscles off of the posterior elements using electrocautery carefully drilled bleeding and perform dissection of the muscle tissues of the preserving the facet at the L1-2 level. Continuing the exposure out laterally to expose facet joints of L2-3, L3-4 and L4-5. Spinous processes of L2, L3 and L4 marked with  OR marking pen sterilely. Viper retractor was used for the upper part of the incision and the cerebellar retractor distally. C-arm fluoroscopy was then brought into the field and using C-arm fluoroscopy then a hole made into the lateral aspect of the pedicle of L2 observed in the pedicle using ball-tipped nerve hook and hockey stick nerve probe initial entry was determined on fluoroscopy to be good position alignment so that a ball handled probe was then used to probe the left L2 pedicle to a depth of nearly 50 mm observed on C-arm fluoroscopy to be beyond the midpoint of the lumbar vertebra and then position alignment within the left L2 pedicle this was then removed and the pedicle channel probed demonstrating patency no sign of rupture the cortex of the pedicle. Tapping with a 4.70m screw tap, then 5.5 mm, 6.528mand 7.75m13maps then a 7.75mm71m50 mm screw shank was placed on the left side at the L2 level.C-arm fluoroscopy was used to localize the hole made in the lateral aspect of the pedicle of L2 on the right localizing the pedicle within the spinal canal with nerve hook and hockey-stick nerve probe carefully passed down the center of the L2 pedicle to a depth of nearly 50 mm. Observed on C-arm fluoroscopy to be in good position alignment channel was probed with a ball-tipped probe ensure patency no sign  of cortical disruption. Following tapping with a 4.75mm 50mew tap, then 5.5 mm, 6.75mm a21m7.75mm ta24mthen a 7.75mm x 56mm screw was placed on the right side at the L2 level..  C-arm fluoroscopy was then brought into the field and using C-arm fluoroscopy then a hole made into the lateral aspect of the left pedicle of L3 observed in the pedicle using ball tipped nerve hook and hockey stick nerve probe initial entry was determined on fluoroscopy to be good position alignment so that a ball handled probe was then used to probe the left L3 pedicle to a depth of nearly 50 mm observed on C-arm fluoroscopy to be beyond the midpoint of the lumbar vertebra and then position alignment within the left L3 pedicle this was then removed and the pedicle channel probed demonstrating patency no sign of rupture the cortex of the pedicle. Tapping with tapping with a 4.75mm scre27map, then 5.5 mm, 6.75mm and 747mm taps t24m a 7.75mm x 50 mm28mrew shank was placed on the left side at the L3 level. C-arm fluoroscopy was used to localize the hole made in the lateral aspect of  the pedicle of L3 on the right localizing the pedicle within the spinal canal with nerve hook and hockey-stick nerve probe carefully passed down the center of the L3 pedicle to a depth of nearly 50 mm. Observed on C-arm fluoroscopy to be in good position alignment channel was probed with a ball-tipped probe ensure patency no sign of cortical disruption. Following tapping with  a 4.620m screw tap, then 5.5 mm, 6.577mand 7.20m32maps then a 7.20mm28m50 mm screw was placed on the right side at the L3 level. C-arm fluoroscopy was then brought into the field and using C-arm fluoroscopy then a hole m-ade into the lateral aspect of the pedicle of left L4 observed in the pedicle using ball tipped nerve hook and hockey stick nerve probe initial entry was determined on fluoroscopy to be good position alignment so that a ball handled probe was then used to probe the left L4 pedicle to a  depth of nearly 50 mm observed on C-arm fluoroscopy to be beyond the midpoint of the lumbar vertebra and then position alignment within the left L4 pedicle this was then removed and the pedicle channel probed demonstrating patency no sign of rupture the cortex of the pedicle. Tapping with  a 4.20mm 29mew tap, then 5.5 mm, 6.20mm a38m7.20mm ta77mthen a 7.20mm x 529mm screw shank was placed on the left side at the L4 level.   C-arm fluoroscopy was used to localize the hole made in the lateral aspect of the pedicle of L 4 on the right localizing the pedicle within the spinal canal with nerve hook and hockey-stick nerve probe carefully passed down the center of the L 4 pedicle to a depth of nearly 50 mm. Observed on C-arm fluoroscopy to be in good position alignment channel was probed with a ball-tipped probe ensure patency no sign of cortical disruption. Following tapping with a 4.20mm scre17map, then 5.5 mm, 6.20mm and 750mm taps t46m a 7.20mm x 50 mm720mrew was placed on the right side pedicle at L 4. C-arm fluoroscopy was then brought into the field and using C-arm fluoroscopy then a hole m-ade into the lateral aspect of the pedicle of left L5 observed in the pedicle using ball tipped nerve hook and hockey stick nerve probe initial entry was determined on fluoroscopy to be good position alignment so that a ball handled probe was then used to probe the left L4 pedicle to a depth of nearly 50 mm observed on C-arm fluoroscopy to be beyond the midpoint of the lumbar vertebra and then position alignment within the left L5 pedicle this was then removed and the pedicle channel probed demonstrating patency no sign of rupture the cortex of the pedicle. Tapping with  a 4.20mm screw ta40mthen 5.5 mm, 6.20mm and 7.20mm36mps then 31m.20mm x 50 mm scr27mshank was placed on the left side at the L5 level.   C-arm fluoroscopy was used to localize the hole made in the lateral aspect of the pedicle of L5 on the right localizing the pedicle within  the spinal canal with nerve hook and hockey-stick nerve probe carefully passed down the center of the L5 pedicle to a depth of nearly 50 mm. Observed on C-arm fluoroscopy to be in good position alignment channel was probed with a ball-tipped probe ensure patency no sign of cortical disruption. Following tapping with a 4.20mm screw tap, t520m 5.5 mm, 6.20mm and 7.20mm tap320mhen a 7.6m x 50 mm screw w120mplaced on  the right side pedicle at L5.  Attention then turned to placement of the transforaminal lumbar interbody fusion cages. L2-3, L3-4 and L4-5 left facets were resected with decompression the left L3,L4 and L5 neuroforamen. Osteotomes and 572m and 324mkerrisons were used for this portion of the decompression. The left posterolateral disc at each L2-3 , L3-4 and L4-5 was exposed to provide for right side L2-3 and L3-4 and L4-5 neuroforamen decompression and for easing of placement of TLIFs (transforaminal lumbar interbody fusion) at each level inferior portions of the lamina and pars were also resected first beginning with the Leksell rongeur and osteotomes and then resecting using 2 and 3 mm Kerrison. The inferior articular process L2, L3 and L4 were resected on the left side.  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 Sterily drape OR magnification was used during this portion procedure. Using a Penfield 4 the left lateral aspect of the thecal sac at the L4-5 disc space was carefully freed up The thecal sac could then easily be retracted in the posterior lateral aspect of the L4-5 disc was exposed 15 blade scalpel used to incise the posterolateral disc and an osteotome used to resect a  small portion of bone off the superior aspect of the posterior superior vertebral body of L5 in order to ease the entry into the L4-5 disc space. A  72m65merrison rongeur was then able to be introduced in the disc space debrided it was quite narrow. 7 mm endplate scraper was used to dialate the L4-5 disc space on the left side attempts were made to dilate further the 8 mm were successful and using small curettes and the disc space was debrided a minimal degenerative disc present in the endplates debrided to bleeding endplate bone. A 6-12 mm x 60m41mrdotic adjustable Sable cage was carefully packed with morcellized bone graft and the been harvested from previous laminotomies additional bone graft was then packed into the intervertebral disc space using the 7 mm trial  packed the graft multiple times. With this then a 6-12 mm x 60mm14mdotic Sable cage was able to be introduced into the disc space on the right side using the correct degree convergence and then impacted then subset beneath the outer aspect of the disc space by about 3 or 4 mm. The cage was then deployed elevated till torque was completed. The cage then further filled with vivigen material via the cage funnel. Bleeding controlled using bipolar electrocautery thrombin soaked gel cottonoids. Then turned to the left L3-4 level similarly the exposure the posterior lateral aspect this was carried out using a Penfield 4 bipolar electrocautery to control small bleeders present. Derricho retractor used to retract the thecal sac and L4 nerve root a 15 blade scalpel was used to incise posterior lateral aspect of the disc the disc space at this level showed a rather severe narrowing posteriorly was more open anteriorly so that an osteotome again was used to resect a small portion the posterior superior lip of the vertebral body at L4  in order to gain ease of access into the L3-4 disc space. The space was debrided of degenerative disc material using pituitary  along root the  entire disc space was then debrided of degenerative disc material using pituitary rongeurs curettage down to bleeding bone endplates. Residual disc was resected using pituitary. This space was then carefully assess using spacers then the disc space debrided using 72m, 877mand 48m54mndplate scrapers, the endplates further debrided with currettes  a 9.0mm45mmm x 27mm18me provided the best fit the lordotic cage was chosen the intervertebral disc space was then packed with autogenous local bone graft that been harvested from the central laminectomy and this was pack multiple times and impacted with 7 mm trial cage apparatus. This provided excellent bone graft within the intervertebral disc space at L3-4 so that the permanent 9.0 mm-16mm 65m by 30 mm was then packed with local bone graft placed into the intervertebral disc space and impacted into place in the correct degree of convergence. The cage was then deployed and elevated within the disc space. The cage was then further packed with vivigen material.  Bleeding controlled using bipolar electrocautery. The cage was subset beneath the posterior aspect of the L3-4 disc space by about 3-4 mm attempting to open the disc space on the left side than as with the L4-5 level. Bleeding hemostasis then attention was turned to the left side at the L2-3 level the left side portion of the disc was excised on the left side then carefully retracting the thecal sac and the appropriate nerve L3 root  pituitary rongeurs were used to debride this cavity L2-3 level a lamina spreader was necessary in order to open the disc space wide enough to accept instruments to debride the intervertebral disc space. At the L2-3 level the disc was quite tight a 7 mm, 8mm, a21m48mm end50mte scraper spreader were able to be introduced. 9 mm-16mm x 331mSabl8mge was chosen 8 degree lordosis type. A vertebral disc space was debrided of degenerative disc material and endplate and a curet  and pituitary rongeurs. This was completely local bone graft was packed into the disc space multiple time using the smaller 7 mm trial to impact the graft. Finally the 9 mm-16mm x 30 72m degree lordotic Sable cage was packed with local bone graft and vivigen this was then impacted into the disc space on the left side at L2-3.  Cage was subset beneath the posterior aspect of the disc space 2 or 3 mm. Observed on C-arm fluoroscopy to be in good position alignment. The cages at L2-3 L3-4 and L4-5 were placed anteriorly as best as possible to restor patient's lordosis 8 degree lordotic cages were used at each level. With this then the transforaminal lumbar interbody fusion portion of the case was completed bleeders were controlled using bipolar electrocautery thrombin-soaked Gelfoam were appropriate. 4 screw shanks on the left had the tulip rod fasteners attached the fasteners on the left and 4 screw fasteners on the right and then each carefully aligned and tightened or loose and a slight amount to allow for placement of rods. Template was made along the right side first quarter inch titanium rod was then carefully contoured used using the template and divided appropriately. This was then placed into the pedicle screws on the left extending from L2-L5 each of the capsule and carefully placed loosely tightened. Attention turned to the left side were similarly and then screws were carefully adjusted to allow for a better pattern screws to allow for placement of fixation rod template for the rod was then taken and a quarter inch titanium rod was then carefully contoured. This  was able to be inserted into the right pedicle screw fasteners and then using a persuader to place caps onto the right L4 fasteners the upper 2 pedicle screw caps appeared to fit quite well. The left L5 rod fastener cap was then tightened 85 pounds then on the left side disc space at L4-5 and L3-4 and L2-3 screw fasteners compression was obtained on  the left side between L4 and L5, L3 andL4, and L2 and L3 by compressing between the fasteners right hand side and tightening the screw caps 85 pounds. Similarly this was done on the right side at  L4-5, L3-4 and L2-3 screws using slightl compression and tightened 85 pounds. Iirrigation was carried out with copious amounts of saline solution this was done throughout the case. Cell Saver was used during the case.Permanent C-arm images were obtained in AP and lateral plane. Remaining local bone graft was then applied along the right lateral posterior lateral region extending from L2 through L5 posterior lamina and into the right L2-3, L3-4 and L4-5 facets following burring of each facet on the right side. Excess Gelfoam was then removed, the lumbodorsal musculature carefully exam debrided of any devitalized tissue following removal of Vicryl retractors were the bleeders were controlled using electrocautery and the area dorsal lumbar muscle were then approximated in the midline with interrupted #1 Vicryl sutures loose the dorsal fascia was reattached to the spinous process of L1 to superiorly and S1 inferiorly this was done with #1 Vicryl sutures. Subcutaneous layers then approximated using interrupted 0 Vicryl sutures and 2-0 Vicryl sutures. Skin was closed with stainless steel staples then MedPlex bandage. All instrument and sponge counts were correct. The patient was then returned to a supine position on her bed reactivated extubated and returned to the recovery room in satisfactory condition.   Benjiman Core, PA-C perform the duties of assistant surgeon during this case. He was present from the beginning of the case to the end of the case assisting in transfer the patient from his stretcher to the OR table and back to the stretcher at the end of the case. Assisted in careful retraction and suction of the laminectomy site delicate neural structures operating under the operating room microscope. He performed closure  of the incision from the fascia to the skin applying the dressing.           Basil Dess  12/20/2020, Basil Dess

## 2020-12-21 ENCOUNTER — Ambulatory Visit: Payer: Self-pay | Admitting: Physical Therapy

## 2020-12-21 ENCOUNTER — Telehealth: Payer: Self-pay | Admitting: Physical Therapy

## 2020-12-21 NOTE — Telephone Encounter (Signed)
Left VM for patient due to missed PT appointment. Patient informed of missed appointment, this was last scheduled appointment so patient instructed to contact PT office to schedule further appointments. Patient reminded of attendance policy.  Hilda Blades, PT, DPT, LAT, ATC 12/21/20  10:23 AM Phone: [phone] Fax: [phone]

## 2020-12-22 NOTE — Telephone Encounter (Signed)
Per JN this has been done

## 2020-12-27 MED FILL — ?METOPROLOL TART 50 MG TABL: 50 | 30 days supply | Qty: 60 | Fill #2

## 2021-01-02 MED FILL — ?ACYCLOVIR 200 MG CAPSU: 200 | 30 days supply | Qty: 60 | Fill #0

## 2021-01-02 MED FILL — FUROSEMIDE 20 MG TABS: 20 | 30 days supply | Qty: 30 | Fill #0

## 2021-01-02 MED FILL — METFORMIN HCL 500 MG TABS: 500 | 30 days supply | Qty: 60 | Fill #0

## 2021-01-03 ENCOUNTER — Other Ambulatory Visit: Payer: Self-pay | Admitting: Critical Care Medicine

## 2021-01-03 MED FILL — ?ATORVASTATIN 40MG TABLET: 40 | 30 days supply | Qty: 30 | Fill #0

## 2021-01-03 MED FILL — GABAPENTIN 100 MG CAPSULE: 100 | 30 days supply | Qty: 180 | Fill #0

## 2021-01-03 MED FILL — AMLODIPINE BESYLATE 10 MG T: 10 | 30 days supply | Qty: 30 | Fill #0

## 2021-01-15 ENCOUNTER — Ambulatory Visit: Payer: Self-pay | Admitting: Allergy

## 2021-01-17 ENCOUNTER — Other Ambulatory Visit: Payer: Self-pay

## 2021-01-17 ENCOUNTER — Ambulatory Visit: Payer: Self-pay

## 2021-01-17 ENCOUNTER — Ambulatory Visit (INDEPENDENT_AMBULATORY_CARE_PROVIDER_SITE_OTHER): Payer: Self-pay | Admitting: Specialist

## 2021-01-17 ENCOUNTER — Encounter: Payer: Self-pay | Admitting: Specialist

## 2021-01-17 VITALS — BP 130/85 | HR 78 | Ht 71.0 in | Wt 276.0 lb

## 2021-01-17 DIAGNOSIS — Z981 Arthrodesis status: Secondary | ICD-10-CM

## 2021-01-17 DIAGNOSIS — M1712 Unilateral primary osteoarthritis, left knee: Secondary | ICD-10-CM

## 2021-01-17 DIAGNOSIS — M1120 Other chondrocalcinosis, unspecified site: Secondary | ICD-10-CM

## 2021-01-17 DIAGNOSIS — M25462 Effusion, left knee: Secondary | ICD-10-CM

## 2021-01-17 DIAGNOSIS — M11162 Familial chondrocalcinosis, left knee: Secondary | ICD-10-CM

## 2021-01-17 MED ORDER — LIDOCAINE HCL (PF) 1 % IJ SOLN
3.0000 mL | INTRAMUSCULAR | Status: AC | PRN
  Administered 2021-01-17: 3 mL

## 2021-01-17 MED ORDER — BUPIVACAINE HCL 0.5 % IJ SOLN
3.0000 mL | INTRAMUSCULAR | Status: AC | PRN
  Administered 2021-01-17: 3 mL via INTRA_ARTICULAR

## 2021-01-17 MED ORDER — METHYLPREDNISOLONE ACETATE 40 MG/ML IJ SUSP
40.0000 mg | INTRAMUSCULAR | Status: AC | PRN
  Administered 2021-01-17: 40 mg via INTRA_ARTICULAR

## 2021-01-17 NOTE — Patient Instructions (Signed)
Plan: Knee is suffering from osteoarthritis, only real proven treatments are Weight loss, NSIADs like diclofenac gel and exercise. Well padded shoes help. Ice the knee that is suffering from osteoarthritis, only real proven treatments are Weight loss, NSIADs like motrin or naprosyn, voltaren gel  and exercise. Well padded shoes help. Ice the knee 2-3 times a day 15-20 mins at a time.-3 times a day 15-20 mins at a time. Hot showers in the AM.  Injection with steroid may be of benefit. Hemp CBD capsules, [email or web address] 5,000-7,000 mg per bottle, 60 capsules per bottle, take one capsule twice a day. Cane in the left hand to use with left leg weight bearing. Follow-Up Instructions: No follow-ups on file.

## 2021-01-17 NOTE — Progress Notes (Signed)
Office Visit Note   Patient: Sydney Williams           Date of Birth: Feb 04, 1966           MRN: [id number] Visit Date: 01/17/2021              Requested by: Elsie Stain, MD [address],  [place] [address] PCP: Elsie Stain, MD   Assessment & Plan: Visit Diagnoses:  1. S/P lumbar fusion   2. Unilateral primary osteoarthritis, left knee   3. Effusion, left knee   4. Chondrocalcinosis articularis     Plan: Plan: Knee is suffering from osteoarthritis, only real proven treatments are Weight loss, NSIADs like diclofenac gel and exercise. Well padded shoes help. Ice the knee that is suffering from osteoarthritis, only real proven treatments are Weight loss, NSIADs like motrin or naprosyn, voltaren gel  and exercise. Well padded shoes help. Ice the knee 2-3 times a day 15-20 mins at a time.-3 times a day 15-20 mins at a time. Hot showers in the AM.  Injection with steroid may be of benefit. Hemp CBD capsules, amazon.com 5,000-7,000 mg per bottle, 60 capsules per bottle, take one capsule twice a day. Cane in the left hand to use with left leg weight bearing. Follow-Up Instructions: No follow-ups on file.   Follow-Up Instructions: No follow-ups on file.   Orders:  Orders Placed This Encounter  Procedures  . XR Lumbar Spine 2-3 Views  . XR Knee 1-2 Views Left   No orders of the defined types were placed in this encounter.     Procedures: Large Joint Inj: L knee on 01/17/2021 11:51 AM Indications: pain Details: 18 G 1.5 in needle, anterolateral approach  Arthrogram: No  Medications: 40 mg methylPREDNISolone acetate 40 MG/ML; 3 mL bupivacaine 0.5 %; 3 mL lidocaine (PF) 1 % Aspirate: 20 mL yellow and clear Outcome: tolerated well, no immediate complications  Bandaid applied. Procedure, treatment alternatives, risks and benefits explained, specific risks discussed. Consent was given by the patient. Immediately prior to procedure a time out was called to  verify the correct patient, procedure, equipment, support staff and site/side marked as required. Patient was prepped and draped in the usual sterile fashion.       Clinical Data: No additional findings.   Subjective: Chief Complaint  Patient presents with  . Lower Back - Routine Post Op    HPI  Review of Systems  Constitutional: Negative.   HENT: Negative.   Eyes: Negative.   Respiratory: Negative.   Cardiovascular: Negative.   Gastrointestinal: Negative.   Endocrine: Negative.   Genitourinary: Negative.   Musculoskeletal: Positive for gait problem and joint swelling.  Skin: Negative.   Allergic/Immunologic: Negative.   Hematological: Negative.   Psychiatric/Behavioral: Negative.      Objective: Vital Signs: BP 130/85 (BP Location: Left Arm, Patient Position: Sitting)   Pulse 78   Ht 5\' 11"  (1.803 m)   Wt 276 lb (125.2 kg)   LMP 06/29/2015 (Exact Date)   BMI 38.49 kg/m   Physical Exam  Ortho Exam  Specialty Comments:  No specialty comments available.  Imaging: No results found.   PMFS History: Patient Active Problem List   Diagnosis Date Noted  . Degenerative disc disease, lumbar   . History of lumbar spinal fusion 10/24/2020  . Seasonal and perennial allergic rhinoconjunctivitis 09/18/2020  . Carpal tunnel syndrome on right   . Seronegative rheumatoid arthritis ([place]) 08/04/2020  . Allergic conjunctivitis of both eyes  07/19/2020  . Lumbar radiculopathy, chronic 04/03/2020  . Elevated blood uric acid level 04/03/2020  . Family history of cervical cancer 02/02/2018  . Family history of breast cancer 02/02/2018  . Vitamin D deficiency 02/02/2018  . Depression 06/19/2017  . 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 Snoring 09/11/2016  . History of migraine 09/11/2016  . Frequent headaches 09/11/2016  . Moderate persistent asthma 06/01/2015  . Gastroesophageal reflux disease without esophagitis 06/01/2015  . Other allergic rhinitis  06/01/2015  . Tobacco user 11/30/2014  . Essential hypertension 11/30/2014  . Hyperlipidemia 11/30/2014  . Obesity 07/02/2012  . Type 2 diabetes mellitus without complication, without long-term current use of insulin ([place]) 07/02/2012   Past Medical History:  Diagnosis Date  . Asthma    2015 hospitaliation for asthma  . Diabetes mellitus    Type II  . Family history of breast cancer   . Fibroids 2010  . Genital herpes   . Genital herpes 11/30/2014  . GERD (gastroesophageal reflux disease)   . Hyperlipidemia   . Hypertension   . Impaired fasting blood sugar   . Migraines   . Obesity   . Right carpal tunnel syndrome   . Smoker     Family History  Problem Relation Age of Onset  . Diabetes Mother   . Hypertension Mother   . Aneurysm Mother   . Stroke Mother   . Cancer Mother        cervical cancer  . Cancer Maternal Aunt        breast  . Cancer Cousin        breast/breast  . Lupus Cousin   . Heart disease Neg Hx   . Colon cancer Neg Hx   . Allergic rhinitis Neg Hx   . Angioedema Neg Hx   . Asthma Neg Hx   . Atopy Neg Hx   . Eczema Neg Hx   . Immunodeficiency Neg Hx   . Urticaria Neg Hx     Past Surgical History:  Procedure Laterality Date  . CARDIAC CATHETERIZATION    . CARPAL TUNNEL RELEASE Right 08/30/2020   Procedure: RIGHT CARPAL TUNNEL RELEASE;  Surgeon: Leandrew Koyanagi, MD;  Location: [facility];  Service: Orthopedics;  Laterality: Right;  . COLONOSCOPY  03/13/2016   Dr. Carlean Purl, normal, repeat 2027  . CYST EXCISION     left neck/postauricular region, benign  . DILATION AND CURETTAGE OF UTERUS    . LAPAROSCOPIC ABDOMINAL EXPLORATION     removal of ectopic preg  . LEFT HEART CATH AND CORONARY ANGIOGRAPHY N/A 06/23/2017   Procedure: LEFT HEART CATH AND CORONARY ANGIOGRAPHY;  Surgeon: Nelva Bush, MD;  Location: [facility];  Service: Cardiovascular;  Laterality: N/A;  . TUBAL LIGATION     Social History   Occupational History  .  Not on file  Tobacco Use  . Smoking status: Current Every Day Smoker    Packs/day: 0.25    Years: 14.00    Pack years: 3.50    Types: Cigarettes  . Smokeless tobacco: Never Used  Vaping Use  . Vaping Use: Never used  Substance and Sexual Activity  . Alcohol use: Yes    Comment: occ  . Drug use: No  . Sexual activity: Yes    Birth control/protection: Surgical

## 2021-01-22 MED FILL — HYDROCHLOROTHIAZIDE 25 MG T: 25 | 30 days supply | Qty: 30 | Fill #0

## 2021-01-22 MED FILL — ?METOPROLOL TART 50 MG TABL: 50 | 30 days supply | Qty: 60 | Fill #0

## 2021-01-30 ENCOUNTER — Emergency Department (HOSPITAL_COMMUNITY): Payer: Self-pay

## 2021-01-30 ENCOUNTER — Other Ambulatory Visit: Payer: Self-pay

## 2021-01-30 ENCOUNTER — Encounter (HOSPITAL_COMMUNITY): Payer: Self-pay

## 2021-01-30 ENCOUNTER — Emergency Department (HOSPITAL_COMMUNITY)
Admission: EM | Admit: 2021-01-30 | Discharge: 2021-01-30 | Disposition: A | Discharge status: Home / Self Care | Payer: Self-pay | Attending: Emergency Medicine | Admitting: Emergency Medicine

## 2021-01-30 DIAGNOSIS — Z7982 Long term (current) use of aspirin: Secondary | ICD-10-CM | POA: Insufficient documentation

## 2021-01-30 DIAGNOSIS — Z7984 Long term (current) use of oral hypoglycemic drugs: Secondary | ICD-10-CM | POA: Insufficient documentation

## 2021-01-30 DIAGNOSIS — Z955 Presence of coronary angioplasty implant and graft: Secondary | ICD-10-CM | POA: Insufficient documentation

## 2021-01-30 DIAGNOSIS — J4521 Mild intermittent asthma with (acute) exacerbation: Secondary | ICD-10-CM

## 2021-01-30 DIAGNOSIS — Z79899 Other long term (current) drug therapy: Secondary | ICD-10-CM | POA: Insufficient documentation

## 2021-01-30 DIAGNOSIS — I10 Essential (primary) hypertension: Secondary | ICD-10-CM | POA: Insufficient documentation

## 2021-01-30 DIAGNOSIS — F17210 Nicotine dependence, cigarettes, uncomplicated: Secondary | ICD-10-CM | POA: Insufficient documentation

## 2021-01-30 DIAGNOSIS — J45901 Unspecified asthma with (acute) exacerbation: Secondary | ICD-10-CM | POA: Insufficient documentation

## 2021-01-30 DIAGNOSIS — E119 Type 2 diabetes mellitus without complications: Secondary | ICD-10-CM | POA: Insufficient documentation

## 2021-01-30 DIAGNOSIS — Z20822 Contact with and (suspected) exposure to covid-19: Secondary | ICD-10-CM | POA: Insufficient documentation

## 2021-01-30 LAB — RESP PANEL BY RT-PCR (FLU A&B, COVID) ARPGX2
Influenza A by PCR: NEGATIVE
Influenza B by PCR: NEGATIVE
SARS Coronavirus 2 by RT PCR: NEGATIVE

## 2021-01-30 LAB — CBC
HCT: 43.7 % (ref 36.0–46.0)
Hemoglobin: 14 g/dL (ref 12.0–15.0)
MCH: 29.4 pg (ref 26.0–34.0)
MCHC: 32 g/dL (ref 30.0–36.0)
MCV: 91.8 fL (ref 80.0–100.0)
Platelets: 289 10*3/uL (ref 150–400)
RBC: 4.76 MIL/uL (ref 3.87–5.11)
RDW: 17 % — ABNORMAL HIGH (ref 11.5–15.5)
WBC: 8.6 10*3/uL (ref 4.0–10.5)
nRBC: 0 % (ref 0.0–0.2)

## 2021-01-30 LAB — BASIC METABOLIC PANEL
Anion gap: 12 (ref 5–15)
BUN: 12 mg/dL (ref 6–20)
CO2: 30 mmol/L (ref 22–32)
Calcium: 9.3 mg/dL (ref 8.9–10.3)
Chloride: 101 mmol/L (ref 98–111)
Creatinine, Ser: 0.7 mg/dL (ref 0.44–1.00)
GFR, Estimated: >60 mL/min (ref >60)
Glucose, Bld: 98 mg/dL (ref 70–99)
Potassium: 3.1 mmol/L — ABNORMAL LOW (ref 3.5–5.1)
Sodium: 143 mmol/L (ref 135–145)

## 2021-01-30 LAB — BRAIN NATRIURETIC PEPTIDE: B Natriuretic Peptide: 73.6 pg/mL (ref 0.0–100.0)

## 2021-01-30 MED ORDER — CETIRIZINE HCL 10 MG PO TABS
10.0000 mg | ORAL_TABLET | Freq: Every day | ORAL | 0 refills | Status: DC
  Filled 2021-01-30: qty 10, 10d supply, fill #0

## 2021-01-30 MED ORDER — METHYLPREDNISOLONE SODIUM SUCC 125 MG IJ SOLR: 125.0000 mg | Freq: Once | INTRAMUSCULAR | Status: DC

## 2021-01-30 MED ORDER — PREDNISONE 20 MG PO TABS
60.0000 mg | ORAL_TABLET | Freq: Once | ORAL | Status: AC
  Administered 2021-01-30: 60 mg via ORAL
  Filled 2021-01-30: qty 3

## 2021-01-30 MED ORDER — CETIRIZINE HCL 10 MG PO TABS: 10.0000 mg | ORAL_TABLET | Freq: Every day | ORAL | 0 refills | Status: DC

## 2021-01-30 MED ORDER — PREDNISONE 50 MG PO TABS
50.0000 mg | ORAL_TABLET | Freq: Every day | ORAL | 0 refills | Status: DC
Start: 2021-01-30 — End: 2021-02-16

## 2021-01-30 MED ORDER — IPRATROPIUM BROMIDE HFA 17 MCG/ACT IN AERS
2.0000 | INHALATION_SPRAY | Freq: Once | RESPIRATORY_TRACT | Status: AC
  Administered 2021-01-30: 2 via RESPIRATORY_TRACT
  Filled 2021-01-30: qty 12.9

## 2021-01-30 MED ORDER — ALBUTEROL SULFATE HFA 108 (90 BASE) MCG/ACT IN AERS
4.0000 | INHALATION_SPRAY | Freq: Once | RESPIRATORY_TRACT | Status: AC
  Administered 2021-01-30: 4 via RESPIRATORY_TRACT
  Filled 2021-01-30: qty 6.7

## 2021-01-30 MED ORDER — ALBUTEROL SULFATE (2.5 MG/3ML) 0.083% IN NEBU
5.0000 mg | INHALATION_SOLUTION | Freq: Once | RESPIRATORY_TRACT | Status: AC
  Administered 2021-01-30: 5 mg via RESPIRATORY_TRACT
  Filled 2021-01-30: qty 6

## 2021-01-30 MED ORDER — PREDNISONE 50 MG PO TABS
50.0000 mg | ORAL_TABLET | Freq: Every day | ORAL | 0 refills | Status: DC
Start: 2021-01-30 — End: 2021-01-30
  Filled 2021-01-30: qty 5, 5d supply, fill #0

## 2021-01-30 NOTE — ED Provider Notes (Signed)
[facility] DEPT Provider Note   CSN: [id number] Arrival date & time: 01/30/21  7867     History Chief Complaint  Patient presents with  . Asthma    Sydney Williams is a 55 y.o. female.  HPI   Patient presented to the ED for evaluation of shortness of breath.  Patient states initially symptoms started with what she believed to be her allergies.  She was having sinus congestion and nasal irritation.  Patient started having some cough.  This started about 3 days ago.  Patient has been trying her inhalers but has only been providing temporary relief.  Patient states she has asthma but her last exacerbation was a couple of years ago.  She is not having any fevers.  She denies any chest pain.  She has noted some leg swelling.  Past Medical History:  Diagnosis Date  . Asthma    2015 hospitaliation for asthma  . Diabetes mellitus    Type II  . Family history of breast cancer   . Fibroids 2010  . Genital herpes   . Genital herpes 11/30/2014  . GERD (gastroesophageal reflux disease)   . Hyperlipidemia   . Hypertension   . Impaired fasting blood sugar   . Migraines   . Obesity   . Right carpal tunnel syndrome   . Smoker     Patient Active Problem List   Diagnosis Date Noted  . Degenerative disc disease, lumbar   . History of lumbar spinal fusion 10/24/2020  . Seasonal and perennial allergic rhinoconjunctivitis 09/18/2020  . Carpal tunnel syndrome on right   . Seronegative rheumatoid arthritis ([place]) 08/04/2020  . Allergic conjunctivitis of both eyes 07/19/2020  . Lumbar radiculopathy, chronic 04/03/2020  . Elevated blood uric acid level 04/03/2020  . Family history of cervical cancer 02/02/2018  . Family history of breast cancer 02/02/2018  . Vitamin D deficiency 02/02/2018  . Depression 06/19/2017  . 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 Snoring 09/11/2016  . History of migraine 09/11/2016  . Frequent headaches 09/11/2016  .  Moderate persistent asthma 06/01/2015  . Gastroesophageal reflux disease without esophagitis 06/01/2015  . Other allergic rhinitis 06/01/2015  . Tobacco user 11/30/2014  . Essential hypertension 11/30/2014  . Hyperlipidemia 11/30/2014  . Obesity 07/02/2012  . Type 2 diabetes mellitus without complication, without long-term current use of insulin ([place]) 07/02/2012    Past Surgical History:  Procedure Laterality Date  . CARDIAC CATHETERIZATION    . CARPAL TUNNEL RELEASE Right 08/30/2020   Procedure: RIGHT CARPAL TUNNEL RELEASE;  Surgeon: Leandrew Koyanagi, MD;  Location: [facility];  Service: Orthopedics;  Laterality: Right;  . COLONOSCOPY  03/13/2016   Dr. Carlean Purl, normal, repeat 2027  . CYST EXCISION     left neck/postauricular region, benign  . DILATION AND CURETTAGE OF UTERUS    . LAPAROSCOPIC ABDOMINAL EXPLORATION     removal of ectopic preg  . LEFT HEART CATH AND CORONARY ANGIOGRAPHY N/A 06/23/2017   Procedure: LEFT HEART CATH AND CORONARY ANGIOGRAPHY;  Surgeon: Nelva Bush, MD;  Location: [facility];  Service: Cardiovascular;  Laterality: N/A;  . TUBAL LIGATION       OB History    Gravida  5   Para  1   Term  1   Preterm      AB  2   Living  3     SAB  1   IAB      Ectopic  1  Multiple      Live Births              Family History  Problem Relation Age of Onset  . Diabetes Mother   . Hypertension Mother   . Aneurysm Mother   . Stroke Mother   . Cancer Mother        cervical cancer  . Cancer Maternal Aunt        breast  . Cancer Cousin        breast/breast  . Lupus Cousin   . Heart disease Neg Hx   . Colon cancer Neg Hx   . Allergic rhinitis Neg Hx   . Angioedema Neg Hx   . Asthma Neg Hx   . Atopy Neg Hx   . Eczema Neg Hx   . Immunodeficiency Neg Hx   . Urticaria Neg Hx     Social History   Tobacco Use  . Smoking status: Current Every Day Smoker    Packs/day: 0.25    Years: 14.00    Pack years: 3.50     Types: Cigarettes  . Smokeless tobacco: Never Used  Vaping Use  . Vaping Use: Never used  Substance Use Topics  . Alcohol use: Yes    Comment: occ  . Drug use: No    Home Medications Prior to Admission medications   Medication Sig Start Date End Date Taking? Authorizing Provider  acetaminophen (TYLENOL) 500 MG tablet Take 1,000 mg by mouth every 6 (six) hours as needed for moderate pain.    [provider]  acetaminophen-codeine (TYLENOL #3) 300-30 MG tablet Take 1 tablet by mouth in the morning and at bedtime.    [provider]  acyclovir (ZOVIRAX) 200 MG capsule Take 1 capsule (200 mg total) by mouth 2 (two) times daily. 12/19/20   Elsie Stain, MD  albuterol (VENTOLIN HFA) 108 (90 Base) MCG/ACT inhaler INHALE 1-2 PUFFS INTO THE LUNGS EVERY 6 (SIX) HOURS AS NEEDED FOR WHEEZING OR SHORTNESS OF BREATH. 08/31/20   Elsie Stain, MD  amLODipine (NORVASC) 10 MG tablet Take 1 tablet (10 mg total) by mouth daily. To lower blood pressure 12/19/20   Elsie Stain, MD  ascorbic acid (VITAMIN C) 500 MG tablet Take 500 mg by mouth daily.    [provider]  aspirin (EQ ASPIRIN ADULT LOW DOSE) 81 MG EC tablet Take 1 tablet (81 mg total) by mouth daily. Swallow whole. 02/03/18   Tysinger, Camelia Eng, PA-C  atorvastatin (LIPITOR) 40 MG tablet Take 1 tablet (40 mg total) by mouth daily. 12/19/20   Elsie Stain, MD  cetirizine (ZYRTEC ALLERGY) 10 MG tablet Take 1 tablet (10 mg total) by mouth daily. 01/30/21   Dorie Rank, MD  diphenhydrAMINE (BENADRYL) 25 MG tablet Take 1 tablet (25 mg total) by mouth every 6 (six) hours as needed. Patient taking differently: Take 25 mg by mouth every 6 (six) hours as needed for allergies. 01/09/20   Tedd Sias, PA  furosemide (LASIX) 20 MG tablet Take 1 tablet (20 mg total) by mouth daily as needed for edema. 12/19/20   Elsie Stain, MD  hydrochlorothiazide (HYDRODIURIL) 25 MG tablet Take 1 tablet (25 mg total) by mouth daily.  To lower blood pressure 12/19/20   Elsie Stain, MD  metFORMIN (GLUCOPHAGE) 500 MG tablet Take 1 tablet (500 mg total) by mouth 2 (two) times daily with a meal. 12/19/20   Elsie Stain, MD  metoprolol tartrate (LOPRESSOR) 50 MG  tablet Take 1 tablet (50 mg total) by mouth 2 (two) times daily. 12/19/20   Elsie Stain, MD  omeprazole (PRILOSEC) 40 MG capsule TAKE 1 CAPSULE (40 MG TOTAL) BY MOUTH 2 (TWO) TIMES DAILY. TO DECREASE STOMACH ACID 09/19/20   Elsie Stain, MD  predniSONE (DELTASONE) 50 MG tablet Take 1 tablet (50 mg total) by mouth daily. 01/30/21   Dorie Rank, MD  [place] 200-62.5-25 MCG/INH AEPB INHALE 1 PUFF INTO THE LUNGS DAILY. RINSE MOUTH AFTER EACH USE. 12/15/20   Garnet Sierras, DO  Vitamin D, Cholecalciferol, 25 MCG (1000 UT) TABS Take 1 tablet by mouth once daily 02/18/19   Tysinger, Camelia Eng, PA-C    Allergies    Lisinopril, Potassium chloride, Pneumococcal vaccines, and Vicodin [hydrocodone-acetaminophen]  Review of Systems   Review of Systems  All other systems reviewed and are negative.   Physical Exam Updated Vital Signs BP (!) 142/86   Pulse 87   Temp 98.3 F (36.8 C) (Oral)   Resp 17   Ht 1.803 m (5\' 11" )   Wt 128.4 kg   LMP 06/29/2015 (Exact Date)   SpO2 93%   BMI 39.47 kg/m   Physical Exam Vitals and nursing note reviewed.  Constitutional:      General: She is not in acute distress.    Appearance: She is well-developed.  HENT:     Head: Normocephalic and atraumatic.     Right Ear: External ear normal.     Left Ear: External ear normal.  Eyes:     General: No scleral icterus.       Right eye: No discharge.        Left eye: No discharge.     Conjunctiva/sclera: Conjunctivae normal.  Neck:     Trachea: No tracheal deviation.  Cardiovascular:     Rate and Rhythm: Normal rate and regular rhythm.  Pulmonary:     Effort: Pulmonary effort is normal. No respiratory distress.     Breath sounds: Normal breath sounds. No stridor. No  wheezing or rales.  Abdominal:     General: Bowel sounds are normal. There is no distension.     Palpations: Abdomen is soft.     Tenderness: There is no abdominal tenderness. There is no guarding or rebound.  Musculoskeletal:        General: No tenderness.     Cervical back: Neck supple.  Skin:    General: Skin is warm and dry.     Findings: No rash.  Neurological:     Mental Status: She is alert.     Cranial Nerves: No cranial nerve deficit (no facial droop, extraocular movements intact, no slurred speech).     Sensory: No sensory deficit.     Motor: No abnormal muscle tone or seizure activity.     Coordination: Coordination normal.     ED Results / Procedures / Treatments   Labs (all labs ordered are listed, but only abnormal results are displayed) Labs Reviewed  CBC - Abnormal; Notable for the following components:      Result Value   RDW 17.0 (*)    All other components within normal limits  BASIC METABOLIC PANEL - Abnormal; Notable for the following components:   Potassium 3.1 (*)    All other components within normal limits  RESP PANEL BY RT-PCR (FLU A&B, COVID) ARPGX2  BRAIN NATRIURETIC PEPTIDE    EKG EKG Interpretation  Date/Time:  Tuesday January 30 2021 07:43:02 EDT Ventricular Rate:  79  PR Interval:  176 QRS Duration: 100 QT Interval:  392 QTC Calculation: 450 R Axis:   -22 Text Interpretation: Sinus rhythm Borderline left axis deviation Poor R wave progression No significant change since last tracing Confirmed by Dorie Rank [phone]) on 01/30/2021 7:49:04 AM   Radiology DG Chest Portable 1 View  Result Date: 01/30/2021 CLINICAL DATA:  Dyspnea Asthma Cough Chest tightness EXAM: PORTABLE CHEST 1 VIEW COMPARISON:  01/15/2020 FINDINGS: Heart size within normal limits. No significant pulmonary vascular congestion. Lungs are well aerated. Right cervical rib noted at C7. IMPRESSION: No acute cardiopulmonary process. Electronically Signed   By: Miachel Roux M.D.   On:  01/30/2021 07:37    Procedures Procedures   Medications Ordered in ED Medications  albuterol (VENTOLIN HFA) 108 (90 Base) MCG/ACT inhaler 4 puff (4 puffs Inhalation Given 01/30/21 0745)  ipratropium (ATROVENT HFA) inhaler 2 puff (2 puffs Inhalation Given 01/30/21 0745)  predniSONE (DELTASONE) tablet 60 mg (60 mg Oral Given 01/30/21 0746)  albuterol (PROVENTIL) (2.5 MG/3ML) 0.083% nebulizer solution 5 mg (5 mg Nebulization Given 01/30/21 1027)    ED Course  I have reviewed the triage vital signs and the nursing notes.  Pertinent labs & imaging results that were available during my care of the patient were reviewed by me and considered in my medical decision making (see chart for details).  Clinical Course as of 01/30/21 1113  Tue Jan 30, 2021  4782 CBC metabolic panel normal.  Covid test is negative. [JK]  [id number] X-ray without acute findings. [JK]  0949 BNP is normal  [JK]  1001 Patient still feeling short of breath.  Will provide breathing treatment [JK]    Clinical Course User Index [JK] Dorie Rank, MD   MDM Rules/Calculators/A&P                          Patient presented to the ED for evaluation of shortness of breath.  On initial exam patient had no wheezing.  Patient's x-ray did not show pneumonia.  No signs of CHF.  She was not in any distress in the ED but did have some persistent wheezing so patient was given breathing treatments as well as steroids.  Patient has noted some improvement.  Will discharge home with antihistamines and steroids.  Continue her albuterol treatments.  Follow-up with her doctor to recheck Final Clinical Impression(s) / ED Diagnoses Final diagnoses:  Exacerbation of intermittent asthma, unspecified asthma severity    Rx / DC Orders ED Discharge Orders         Ordered    predniSONE (DELTASONE) 50 MG tablet  Daily,   Status:  Discontinued        01/30/21 1112    cetirizine (ZYRTEC ALLERGY) 10 MG tablet  Daily,   Status:  Discontinued        01/30/21 1112     cetirizine (ZYRTEC ALLERGY) 10 MG tablet  Daily        01/30/21 1112    predniSONE (DELTASONE) 50 MG tablet  Daily        01/30/21 1112           Dorie Rank, MD 01/30/21 1114

## 2021-01-30 NOTE — ED Triage Notes (Signed)
Pt sts allergies and asthma have been getting worse for 3 days.

## 2021-01-30 NOTE — Discharge Instructions (Addendum)
Continue your breathing treatments at home.  Take the steroids and antihistamines as prescribed.  Follow-up with your doctor to be rechecked if the symptoms not improving.  Return as needed for worsening symptoms.

## 2021-01-30 NOTE — ED Provider Notes (Signed)
MSE was initiated and I personally evaluated the patient and placed orders (if any) at  7:02 AM on January 30, 2021.  Pt with 3 fay h/o worsening sob and cough c/w asthma. Has been using inhaler with short lived relief. No fever or new CP.   Will order covid and start asthma tx.   Exam: diffuse expiratory wheezing. spo2 stable. Not in respiratory distress  The patient appears stable so that the remainder of the MSE may be completed by another provider.   Franchot Heidelberg, PA-C 01/30/21 0703    Dorie Rank, MD 01/31/21 [phone]

## 2021-02-06 ENCOUNTER — Other Ambulatory Visit: Payer: Self-pay

## 2021-02-06 MED FILL — Atorvastatin Calcium Tab 40 MG (Base Equivalent): ORAL | 30 days supply | Qty: 30 | Fill #0 | Status: AC

## 2021-02-06 MED FILL — Amlodipine Besylate Tab 10 MG (Base Equivalent): ORAL | 30 days supply | Qty: 30 | Fill #0 | Status: AC

## 2021-02-08 ENCOUNTER — Ambulatory Visit: Payer: Self-pay | Admitting: Specialist

## 2021-02-12 ENCOUNTER — Other Ambulatory Visit: Payer: Self-pay

## 2021-02-15 NOTE — Progress Notes (Signed)
Follow Up Note  RE: Sydney Williams MRN: [id number] DOB: [birth year] Date of Office Visit: 02/16/2021  Referring provider: Elsie Stain, MD Primary care provider: Elsie Stain, MD  Chief Complaint: Allergic Rhinitis  (Says that her allergies are bad. Pressure behind her eyes. Headaches and sore throat. Bloody mucus from nose and hard to blow out. She also has congestion.) and Asthma (No issues with asthma.)  History of Present Illness: I had the pleasure of seeing Sydney Williams for a follow up visit at the [facility] on 02/16/2021. She is a 55 y.o. female, who is being followed for asthma, allergic rhinoconjunctivitis. Her previous allergy office visit was on 09/18/2020 with Dr. Maudie Mercury. Today is a regular follow up visit.  Moderate persistent asthma Currently on Trelegy 212mcg 1 puff once a day and using albuterol nebulizer once per week and albuterol twice a day for awhile.  Patient was doing well with her asthma until recently.  About 2 weeks ago went to the ER for asthma exacerbation and was treated with steroids which helped but about 4 days ago she noted itchy throat, sinus pressure, rhinorrhea, coughing, sneezing - allergies are flaring.   Seasonal and perennial allergic rhinoconjunctivitis Currently on allegra daily and does not like to use nasal sprays.  Patient is not on Singulair right now - ? History of side effects. Using OTC eye drops.   01/30/2021 ER visit: " Patient presented to the ED for evaluation of shortness of breath.  Patient states initially symptoms started with what she believed to be her allergies.  She was having sinus congestion and nasal irritation.  Patient started having some cough.  This started about 3 days ago.  Patient has been trying her inhalers but has only been providing temporary relief.  Patient states she has asthma but her last exacerbation was a couple of years ago.  She is not having any fevers.  She denies any chest pain.   She has noted some leg swelling."  Assessment and Plan: Jasmain is a 55 y.o. female with: Moderate persistent asthma with acute exacerbation Past history - Diagnosed with asthma 8 years ago but symptoms worse the last year. Main triggers include allergies, infections and weather changes.  Patient is a current smoker.  Takes omeprazole for reflux. 2021 spirometry consistent with mixed restriction and obstruction with no improvement in FEV1 post bronchodilator treatment. Clinically feeling improved and wheezing resolved.  Interim history - Doing better with Trelegy and no flares until recently. Finished prednisone but now having worsening symptoms again. . Encouraged smoking cessation. . Today's spirometry showed some restriction. . Start prednisone taper - monitor BGM. Marland Kitchen Daily controller medication(s): continue Trelegy 236mcg 1 puff once a day. . May use albuterol rescue inhaler 2 puffs or nebulizer every 4 to 6 hours as needed for shortness of breath, chest tightness, coughing, and wheezing. May use albuterol rescue inhaler 2 puffs 5 to 15 minutes prior to strenuous physical activities. Monitor frequency of use.   Repeat spirometry at next visit. . If no improvement in symptoms will consider adding biologics treatment next.  Seasonal and perennial allergic rhinoconjunctivitis Past history - Perennial rhinoconjunctivitis symptoms for 30 years with worsening in the spring. 2021 skin testing showed: Positive to grass, weed, ragweed, trees, dust mites. Borderline to feathers.  Interim history - increased symptoms.  Continue environmental control measures as below.  May use over the counter antihistamines such as Allegra (fexofenadine) daily as needed. May take twice a  day during flares if needed.  May use olopatadine eye drops 0.2% once a day as needed for itchy/watery eyes. Start Singulair (montelukast) 10mg  daily at night. Cautioned that in some children/adults can experience behavioral changes  including hyperactivity, agitation, depression, sleep disturbances and suicidal ideations. These side effects are rare, but if you notice them you should notify me and discontinue Singulair (montelukast).  May use Flonase (fluticasone) nasal spray 1 spray per nostril twice a day as needed for nasal congestion.   Nasal saline spray (i.e., Simply Saline) or nasal saline lavage (i.e., NeilMed) is recommended as needed and prior to medicated nasal sprays.  Consider allergy injections for long term control if above medications do not help the symptoms - handout given.   Return in about 4 weeks (around 03/16/2021).  Meds ordered this encounter  Medications  . Fluticasone-Umeclidin-Vilant (TRELEGY ELLIPTA) 200-62.5-25 MCG/INH AEPB    Sig: Inhale 1 puff into the lungs daily. Rinse mouth after each use.    Dispense:  60 each    Refill:  5    No more refills until pt is seen  . montelukast (SINGULAIR) 10 MG tablet    Sig: Take 1 tablet (10 mg total) by mouth at bedtime.    Dispense:  30 tablet    Refill:  5  . fluticasone (FLONASE) 50 MCG/ACT nasal spray    Sig: Place 1 spray into both nostrils 2 (two) times daily as needed for rhinitis.    Dispense:  16 g    Refill:  5  . Olopatadine HCl 0.2 % SOLN    Sig: Apply 1 drop to eye daily as needed (itchy/watery eyes).    Dispense:  2.5 mL    Refill:  5   Lab Orders  No laboratory test(s) ordered today    Diagnostics: Spirometry:  Tracings reviewed. Her effort: Good reproducible efforts. FVC: 2.52L FEV1: 1.89L, 66% predicted FEV1/FVC ratio: 75% Interpretation: Spirometry consistent with possible restrictive disease.  Please see scanned spirometry results for details.  Medication List:  Current Outpatient Medications  Medication Sig Dispense Refill  . acetaminophen (TYLENOL) 500 MG tablet Take 1,000 mg by mouth every 6 (six) hours as needed for moderate pain.    Marland Kitchen acetaminophen-codeine (TYLENOL #3) 300-30 MG tablet Take 1 tablet by mouth  in the morning and at bedtime.    Marland Kitchen acyclovir (ZOVIRAX) 200 MG capsule Take 1 capsule (200 mg total) by mouth 2 (two) times daily. 180 capsule 3  . acyclovir (ZOVIRAX) 200 MG capsule TAKE 1 CAPSULE (200 MG TOTAL) BY MOUTH 2 (TWO) TIMES DAILY. 180 capsule 3  . albuterol (VENTOLIN HFA) 108 (90 Base) MCG/ACT inhaler INHALE 1-2 PUFFS INTO THE LUNGS EVERY 6 (SIX) HOURS AS NEEDED FOR WHEEZING OR SHORTNESS OF BREATH. 18 g 0  . amLODipine (NORVASC) 10 MG tablet Take 1 tablet (10 mg total) by mouth daily. To lower blood pressure 90 tablet 2  . ascorbic acid (VITAMIN C) 500 MG tablet Take 500 mg by mouth daily.    Marland Kitchen aspirin (EQ ASPIRIN ADULT LOW DOSE) 81 MG EC tablet Take 1 tablet (81 mg total) by mouth daily. Swallow whole. 90 tablet 3  . atorvastatin (LIPITOR) 40 MG tablet Take 1 tablet (40 mg total) by mouth daily. 90 tablet 2  . baclofen (LIORESAL) 10 MG tablet TAKE 1-2 TABLETS (10-20 MG TOTAL) BY MOUTH 3 (THREE) TIMES DAILY AS NEEDED FOR MUSCLE SPASMS. 90 tablet 1  . diphenhydrAMINE (BENADRYL) 25 MG tablet Take 1 tablet (25 mg total)  by mouth every 6 (six) hours as needed. (Patient taking differently: Take 25 mg by mouth every 6 (six) hours as needed for allergies.) 30 tablet 0  . fluticasone (FLONASE) 50 MCG/ACT nasal spray Place 1 spray into both nostrils 2 (two) times daily as needed for rhinitis. 16 g 5  . Fluticasone-Umeclidin-Vilant 200-62.5-25 MCG/INH AEPB INHALE 1 PUFF INTO THE LUNGS DAILY. RINSE MOUTH AFTER EACH USE. NO MORE REFILS UNTIL SEEN 60 each 0  . furosemide (LASIX) 20 MG tablet Take 1 tablet (20 mg total) by mouth daily as needed for edema. 30 tablet 1  . gabapentin (NEURONTIN) 100 MG capsule TAKE 2 IN MORNING, 2 IN LATE AFTERNOON AND 1-2 AT BEDTIME 180 capsule 3  . hydrochlorothiazide (HYDRODIURIL) 25 MG tablet Take 1 tablet (25 mg total) by mouth daily. To lower blood pressure 90 tablet 3  . ibuprofen (ADVIL) 600 MG tablet TAKE 1 TABLET (600 MG TOTAL) BY MOUTH EVERY 8 (EIGHT) HOURS AS  NEEDED FOR MODERATE PAIN. TAKE AFTER EATING 90 tablet 1  . metFORMIN (GLUCOPHAGE) 500 MG tablet Take 1 tablet (500 mg total) by mouth 2 (two) times daily with a meal. 180 tablet 1  . montelukast (SINGULAIR) 10 MG tablet Take 1 tablet (10 mg total) by mouth at bedtime. 30 tablet 5  . Olopatadine HCl 0.2 % SOLN Apply 1 drop to eye daily as needed (itchy/watery eyes). 2.5 mL 5  . omeprazole (PRILOSEC) 40 MG capsule TAKE 1 CAPSULE (40 MG TOTAL) BY MOUTH 2 (TWO) TIMES DAILY. TO DECREASE STOMACH ACID 60 capsule 1  . Vitamin D, Cholecalciferol, 25 MCG (1000 UT) TABS Take 1 tablet by mouth once daily 90 tablet 0  . acyclovir (ZOVIRAX) 200 MG capsule TAKE 1 CAPSULE (200 MG TOTAL) BY MOUTH 2 (TWO) TIMES DAILY. 180 capsule 3  . allopurinol (ZYLOPRIM) 100 MG tablet TAKE 1 TABLET (100 MG TOTAL) BY MOUTH DAILY. (Patient not taking: Reported on 02/16/2021) 30 tablet 0  . allopurinol (ZYLOPRIM) 100 MG tablet TAKE 1 TABLET (100 MG TOTAL) BY MOUTH DAILY. (Patient not taking: Reported on 02/16/2021) 30 tablet 6  . amLODipine (NORVASC) 10 MG tablet TAKE 1 TABLET (10 MG TOTAL) BY MOUTH DAILY. TO LOWER BLOOD PRESSURE 90 tablet 2  . amLODipine (NORVASC) 10 MG tablet TAKE 1 TABLET (10 MG TOTAL) BY MOUTH DAILY. TO LOWER BLOOD PRESSURE (Patient taking differently: Take by mouth daily. to lower blood pressure) 30 tablet 0  . amLODipine (NORVASC) 10 MG tablet TAKE 1 TABLET (10 MG TOTAL) BY MOUTH DAILY. TO LOWER BLOOD PRESSURE (Patient taking differently: Take by mouth daily. to lower blood pressure) 30 tablet 0  . amLODipine (NORVASC) 10 MG tablet TAKE 1 TABLET (10 MG TOTAL) BY MOUTH DAILY. TO LOWER BLOOD PRESSURE (Patient taking differently: Take by mouth daily. to lower blood pressure) 30 tablet 0  . amLODipine (NORVASC) 10 MG tablet TAKE 1 TABLET (10 MG TOTAL) BY MOUTH DAILY. TO LOWER BLOOD PRESSURE (Patient taking differently: Take by mouth daily. to lower blood pressure) 90 tablet 0  . atorvastatin (LIPITOR) 40 MG tablet TAKE  1 TABLET (40 MG TOTAL) BY MOUTH DAILY. 90 tablet 2  . atorvastatin (LIPITOR) 40 MG tablet TAKE 1 TABLET (40 MG TOTAL) BY MOUTH DAILY. 30 tablet 2  . atorvastatin (LIPITOR) 40 MG tablet TAKE 1 TABLET (40 MG TOTAL) BY MOUTH DAILY. 30 tablet 2  . atorvastatin (LIPITOR) 40 MG tablet TAKE 1 TABLET (40 MG TOTAL) BY MOUTH DAILY. 30 tablet 2  . cetirizine (ZYRTEC  ALLERGY) 10 MG tablet Take 1 tablet (10 mg total) by mouth daily. (Patient not taking: Reported on 02/16/2021) 10 tablet 0  . Fluticasone-Umeclidin-Vilant (TRELEGY ELLIPTA) 200-62.5-25 MCG/INH AEPB Inhale 1 puff into the lungs daily. Rinse mouth after each use. 60 each 5  . furosemide (LASIX) 20 MG tablet TAKE 1 TABLET (20 MG TOTAL) BY MOUTH DAILY AS NEEDED FOR EDEMA. 30 tablet 1  . hydrochlorothiazide (HYDRODIURIL) 25 MG tablet TAKE 1 TABLET (25 MG TOTAL) BY MOUTH DAILY. TO LOWER BLOOD PRESSURE 90 tablet 3  . hydrochlorothiazide (HYDRODIURIL) 25 MG tablet TAKE 1 TABLET (25 MG TOTAL) BY MOUTH DAILY. TO LOWER BLOOD PRESSURE 90 tablet 3  . ibuprofen (ADVIL) 600 MG tablet TAKE 1 TABLET (600 MG TOTAL) BY MOUTH EVERY 8 (EIGHT) HOURS AS NEEDED FOR MODERATE PAIN. TAKE AFTER EATING 90 tablet 1  . metFORMIN (GLUCOPHAGE) 500 MG tablet TAKE 1 TABLET (500 MG TOTAL) BY MOUTH 2 (TWO) TIMES DAILY WITH A MEAL. 180 tablet 1  . metFORMIN (GLUCOPHAGE) 500 MG tablet TAKE 1 TABLET (500 MG TOTAL) BY MOUTH 2 (TWO) TIMES DAILY WITH A MEAL. 90 tablet 1  . metFORMIN (GLUCOPHAGE) 500 MG tablet TAKE 1 TABLET (500 MG TOTAL) BY MOUTH 2 (TWO) TIMES DAILY WITH A MEAL. 90 tablet 1  . metoprolol tartrate (LOPRESSOR) 50 MG tablet Take 1 tablet (50 mg total) by mouth 2 (two) times daily. (Patient not taking: Reported on 02/16/2021) 60 tablet 2  . omeprazole (PRILOSEC) 40 MG capsule TAKE 1 CAPSULE (40 MG TOTAL) BY MOUTH 2 (TWO) TIMES DAILY. TO DECREASE STOMACH ACID 60 capsule 1   No current facility-administered medications for this visit.   Allergies: Allergies  Allergen Reactions   . Lisinopril Swelling  . Potassium Chloride Shortness Of Breath and Swelling    Tolerates IV KCl, reaction only to PO product  . Pneumococcal Vaccines Swelling and Other (See Comments)    Reaction:  Swelling at injection site  . Vicodin [Hydrocodone-Acetaminophen] Itching and Rash   I reviewed her past medical history, social history, family history, and environmental history and no significant changes have been reported from her previous visit.  Review of Systems  Constitutional: Negative for appetite change, chills, fever and unexpected weight change.  HENT: Positive for congestion and rhinorrhea.   Eyes: Negative for itching.  Respiratory: Positive for cough. Negative for chest tightness, shortness of breath and wheezing.   Cardiovascular: Negative for chest pain.  Gastrointestinal: Negative for abdominal pain.  Genitourinary: Negative for difficulty urinating.  Skin: Negative for rash.  Allergic/Immunologic: Positive for environmental allergies. Negative for food allergies.  Neurological: Negative for headaches.   Objective: BP 110/70   Pulse 84   Temp (!) 97.5 F (36.4 C)   Resp 14   Ht 5\' 11"  (1.803 m)   Wt 274 lb 3.2 oz (124.4 kg)   LMP 06/29/2015 (Exact Date)   SpO2 96%   BMI 38.24 kg/m  Body mass index is 38.24 kg/m. Physical Exam Vitals and nursing note reviewed.  Constitutional:      Appearance: Normal appearance. She is well-developed.  HENT:     Head: Normocephalic and atraumatic.     Right Ear: Tympanic membrane and external ear normal.     Left Ear: Tympanic membrane and external ear normal.     Nose: Congestion (on right side) present.     Mouth/Throat:     Mouth: Mucous membranes are moist.     Pharynx: Oropharynx is clear.  Eyes:     Conjunctiva/sclera:  Conjunctivae normal.  Cardiovascular:     Rate and Rhythm: Normal rate and regular rhythm.     Heart sounds: Normal heart sounds. No murmur heard. No friction rub. No gallop.   Pulmonary:      Effort: Pulmonary effort is normal.     Breath sounds: Normal breath sounds. No wheezing, rhonchi or rales.  Musculoskeletal:     Cervical back: Neck supple.  Skin:    General: Skin is warm.     Findings: No rash.  Neurological:     Mental Status: She is alert and oriented to person, place, and time.  Psychiatric:        Behavior: Behavior normal.    Previous notes and tests were reviewed. The plan was reviewed with the patient/family, and all questions/concerned were addressed.  It was my pleasure to see Lavell today and participate in her care. Please feel free to contact me with any questions or concerns.  Sincerely,  Rexene Alberts, DO Allergy & Immunology  [facility] office: [place] office: [phone]

## 2021-02-16 ENCOUNTER — Ambulatory Visit: Payer: Self-pay | Attending: Internal Medicine | Admitting: Internal Medicine

## 2021-02-16 ENCOUNTER — Other Ambulatory Visit: Payer: Self-pay

## 2021-02-16 ENCOUNTER — Ambulatory Visit (INDEPENDENT_AMBULATORY_CARE_PROVIDER_SITE_OTHER): Payer: No Typology Code available for payment source | Admitting: Allergy

## 2021-02-16 ENCOUNTER — Encounter: Payer: Self-pay | Admitting: Allergy

## 2021-02-16 ENCOUNTER — Encounter: Payer: Self-pay | Admitting: Internal Medicine

## 2021-02-16 VITALS — BP 110/70 | HR 84 | Temp 97.5°F | Resp 14 | Ht 71.0 in | Wt 274.2 lb

## 2021-02-16 VITALS — BP 144/75 | HR 91 | Resp 20 | Ht 71.0 in | Wt 274.2 lb

## 2021-02-16 DIAGNOSIS — J4540 Moderate persistent asthma, uncomplicated: Secondary | ICD-10-CM

## 2021-02-16 DIAGNOSIS — Z1231 Encounter for screening mammogram for malignant neoplasm of breast: Secondary | ICD-10-CM

## 2021-02-16 DIAGNOSIS — J302 Other seasonal allergic rhinitis: Secondary | ICD-10-CM

## 2021-02-16 DIAGNOSIS — Z124 Encounter for screening for malignant neoplasm of cervix: Secondary | ICD-10-CM

## 2021-02-16 DIAGNOSIS — J4541 Moderate persistent asthma with (acute) exacerbation: Secondary | ICD-10-CM

## 2021-02-16 DIAGNOSIS — J3089 Other allergic rhinitis: Secondary | ICD-10-CM

## 2021-02-16 DIAGNOSIS — H1013 Acute atopic conjunctivitis, bilateral: Secondary | ICD-10-CM

## 2021-02-16 DIAGNOSIS — H1010 Acute atopic conjunctivitis, unspecified eye: Secondary | ICD-10-CM

## 2021-02-16 DIAGNOSIS — Z720 Tobacco use: Secondary | ICD-10-CM

## 2021-02-16 MED ORDER — FLUTICASONE PROPIONATE 50 MCG/ACT NA SUSP
1.0000 | Freq: Two times a day (BID) | NASAL | 5 refills | Status: DC | PRN
  Filled 2021-02-16: qty 16, 30d supply, fill #0

## 2021-02-16 MED ORDER — OLOPATADINE HCL 0.2 % OP SOLN
1.0000 [drp] | Freq: Every day | OPHTHALMIC | 5 refills | Status: DC | PRN
  Filled 2021-02-16: qty 2.5, 50d supply, fill #0

## 2021-02-16 MED ORDER — TRELEGY ELLIPTA 200-62.5-25 MCG/INH IN AEPB
1.0000 | INHALATION_SPRAY | Freq: Every day | RESPIRATORY_TRACT | 5 refills | Status: DC
  Filled 2021-02-16: qty 60, 30d supply, fill #0
  Filled 2021-04-05: qty 180, 90d supply, fill #1
  Filled 2021-04-05: qty 60, 30d supply, fill #1

## 2021-02-16 MED ORDER — MONTELUKAST SODIUM 10 MG PO TABS
10.0000 mg | ORAL_TABLET | Freq: Every day | ORAL | 5 refills | Status: DC
  Filled 2021-02-16: qty 30, 30d supply, fill #0

## 2021-02-16 NOTE — Progress Notes (Signed)
Patient ID: Sydney Williams, female    DOB: Dec 14, 1965  MRN: [id number]  CC: Gynecologic Exam   Subjective: Sydney Williams is a 55 y.o. female who presents for pap.  PCP is Dr. Joya Gaskins Her concerns today include:  HTN asthma obesity, smoker, HL  Last PAP was 01/2018. It was ASCUS. Pt is G6P4 No vaginal dischg or itching Active with one female partner for 20 + years Pt is postmenopausal 5-6 yrs ago. Last MMG was 2019.   Fhx of breast cancer in maternal aunt and mom had vaginal CA   Patient Active Problem List   Diagnosis Date Noted  . Degenerative disc disease, lumbar   . History of lumbar spinal fusion 10/24/2020  . Seasonal and perennial allergic rhinoconjunctivitis 09/18/2020  . Carpal tunnel syndrome on right   . Seronegative rheumatoid arthritis ([place]) 08/04/2020  . Allergic conjunctivitis of both eyes 07/19/2020  . Lumbar radiculopathy, chronic 04/03/2020  . Elevated blood uric acid level 04/03/2020  . Family history of cervical cancer 02/02/2018  . Family history of breast cancer 02/02/2018  . Vitamin D deficiency 02/02/2018  . Depression 06/19/2017  . 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 Snoring 09/11/2016  . History of migraine 09/11/2016  . Frequent headaches 09/11/2016  . Gastroesophageal reflux disease without esophagitis 06/01/2015  . Other allergic rhinitis 06/01/2015  . Tobacco user 11/30/2014  . Essential hypertension 11/30/2014  . Hyperlipidemia 11/30/2014  . Moderate persistent asthma with acute exacerbation 11/30/2014  . Obesity 07/02/2012  . Type 2 diabetes mellitus without complication, without long-term current use of insulin ([place]) 07/02/2012     Current Outpatient Medications on File Prior to Visit  Medication Sig Dispense Refill  . acetaminophen (TYLENOL) 500 MG tablet Take 1,000 mg by mouth every 6 (six) hours as needed for moderate pain.    Marland Kitchen acetaminophen-codeine (TYLENOL #3) 300-30 MG tablet Take 1 tablet by mouth in the morning  and at bedtime.    Marland Kitchen acyclovir (ZOVIRAX) 200 MG capsule Take 1 capsule (200 mg total) by mouth 2 (two) times daily. 180 capsule 3  . acyclovir (ZOVIRAX) 200 MG capsule TAKE 1 CAPSULE (200 MG TOTAL) BY MOUTH 2 (TWO) TIMES DAILY. 180 capsule 3  . acyclovir (ZOVIRAX) 200 MG capsule TAKE 1 CAPSULE (200 MG TOTAL) BY MOUTH 2 (TWO) TIMES DAILY. 180 capsule 3  . albuterol (VENTOLIN HFA) 108 (90 Base) MCG/ACT inhaler INHALE 1-2 PUFFS INTO THE LUNGS EVERY 6 (SIX) HOURS AS NEEDED FOR WHEEZING OR SHORTNESS OF BREATH. 18 g 0  . allopurinol (ZYLOPRIM) 100 MG tablet TAKE 1 TABLET (100 MG TOTAL) BY MOUTH DAILY. (Patient not taking: Reported on 02/16/2021) 30 tablet 0  . allopurinol (ZYLOPRIM) 100 MG tablet TAKE 1 TABLET (100 MG TOTAL) BY MOUTH DAILY. (Patient not taking: Reported on 02/16/2021) 30 tablet 6  . amLODipine (NORVASC) 10 MG tablet Take 1 tablet (10 mg total) by mouth daily. To lower blood pressure 90 tablet 2  . amLODipine (NORVASC) 10 MG tablet TAKE 1 TABLET (10 MG TOTAL) BY MOUTH DAILY. TO LOWER BLOOD PRESSURE 90 tablet 2  . amLODipine (NORVASC) 10 MG tablet TAKE 1 TABLET (10 MG TOTAL) BY MOUTH DAILY. TO LOWER BLOOD PRESSURE (Patient taking differently: Take by mouth daily. to lower blood pressure) 30 tablet 0  . amLODipine (NORVASC) 10 MG tablet TAKE 1 TABLET (10 MG TOTAL) BY MOUTH DAILY. TO LOWER BLOOD PRESSURE (Patient taking differently: Take by mouth daily. to lower blood pressure) 30 tablet 0  . amLODipine (  NORVASC) 10 MG tablet TAKE 1 TABLET (10 MG TOTAL) BY MOUTH DAILY. TO LOWER BLOOD PRESSURE (Patient taking differently: Take by mouth daily. to lower blood pressure) 30 tablet 0  . amLODipine (NORVASC) 10 MG tablet TAKE 1 TABLET (10 MG TOTAL) BY MOUTH DAILY. TO LOWER BLOOD PRESSURE (Patient taking differently: Take by mouth daily. to lower blood pressure) 90 tablet 0  . ascorbic acid (VITAMIN C) 500 MG tablet Take 500 mg by mouth daily.    Marland Kitchen aspirin (EQ ASPIRIN ADULT LOW DOSE) 81 MG EC tablet Take  1 tablet (81 mg total) by mouth daily. Swallow whole. 90 tablet 3  . atorvastatin (LIPITOR) 40 MG tablet Take 1 tablet (40 mg total) by mouth daily. 90 tablet 2  . atorvastatin (LIPITOR) 40 MG tablet TAKE 1 TABLET (40 MG TOTAL) BY MOUTH DAILY. 90 tablet 2  . atorvastatin (LIPITOR) 40 MG tablet TAKE 1 TABLET (40 MG TOTAL) BY MOUTH DAILY. 30 tablet 2  . atorvastatin (LIPITOR) 40 MG tablet TAKE 1 TABLET (40 MG TOTAL) BY MOUTH DAILY. 30 tablet 2  . atorvastatin (LIPITOR) 40 MG tablet TAKE 1 TABLET (40 MG TOTAL) BY MOUTH DAILY. 30 tablet 2  . baclofen (LIORESAL) 10 MG tablet TAKE 1-2 TABLETS (10-20 MG TOTAL) BY MOUTH 3 (THREE) TIMES DAILY AS NEEDED FOR MUSCLE SPASMS. 90 tablet 1  . cetirizine (ZYRTEC ALLERGY) 10 MG tablet Take 1 tablet (10 mg total) by mouth daily. (Patient not taking: Reported on 02/16/2021) 10 tablet 0  . diphenhydrAMINE (BENADRYL) 25 MG tablet Take 1 tablet (25 mg total) by mouth every 6 (six) hours as needed. (Patient taking differently: Take 25 mg by mouth every 6 (six) hours as needed for allergies.) 30 tablet 0  . Fluticasone-Umeclidin-Vilant 200-62.5-25 MCG/INH AEPB INHALE 1 PUFF INTO THE LUNGS DAILY. RINSE MOUTH AFTER EACH USE. NO MORE REFILS UNTIL SEEN 60 each 0  . furosemide (LASIX) 20 MG tablet Take 1 tablet (20 mg total) by mouth daily as needed for edema. 30 tablet 1  . furosemide (LASIX) 20 MG tablet TAKE 1 TABLET (20 MG TOTAL) BY MOUTH DAILY AS NEEDED FOR EDEMA. 30 tablet 1  . gabapentin (NEURONTIN) 100 MG capsule TAKE 2 IN MORNING, 2 IN LATE AFTERNOON AND 1-2 AT BEDTIME 180 capsule 3  . hydrochlorothiazide (HYDRODIURIL) 25 MG tablet Take 1 tablet (25 mg total) by mouth daily. To lower blood pressure 90 tablet 3  . hydrochlorothiazide (HYDRODIURIL) 25 MG tablet TAKE 1 TABLET (25 MG TOTAL) BY MOUTH DAILY. TO LOWER BLOOD PRESSURE 90 tablet 3  . hydrochlorothiazide (HYDRODIURIL) 25 MG tablet TAKE 1 TABLET (25 MG TOTAL) BY MOUTH DAILY. TO LOWER BLOOD PRESSURE 90 tablet 3  .  ibuprofen (ADVIL) 600 MG tablet TAKE 1 TABLET (600 MG TOTAL) BY MOUTH EVERY 8 (EIGHT) HOURS AS NEEDED FOR MODERATE PAIN. TAKE AFTER EATING 90 tablet 1  . ibuprofen (ADVIL) 600 MG tablet TAKE 1 TABLET (600 MG TOTAL) BY MOUTH EVERY 8 (EIGHT) HOURS AS NEEDED FOR MODERATE PAIN. TAKE AFTER EATING 90 tablet 1  . metFORMIN (GLUCOPHAGE) 500 MG tablet Take 1 tablet (500 mg total) by mouth 2 (two) times daily with a meal. 180 tablet 1  . metFORMIN (GLUCOPHAGE) 500 MG tablet TAKE 1 TABLET (500 MG TOTAL) BY MOUTH 2 (TWO) TIMES DAILY WITH A MEAL. 180 tablet 1  . metFORMIN (GLUCOPHAGE) 500 MG tablet TAKE 1 TABLET (500 MG TOTAL) BY MOUTH 2 (TWO) TIMES DAILY WITH A MEAL. 90 tablet 1  . metFORMIN (GLUCOPHAGE)  500 MG tablet TAKE 1 TABLET (500 MG TOTAL) BY MOUTH 2 (TWO) TIMES DAILY WITH A MEAL. 90 tablet 1  . metoprolol tartrate (LOPRESSOR) 50 MG tablet Take 1 tablet (50 mg total) by mouth 2 (two) times daily. (Patient not taking: Reported on 02/16/2021) 60 tablet 2  . omeprazole (PRILOSEC) 40 MG capsule TAKE 1 CAPSULE (40 MG TOTAL) BY MOUTH 2 (TWO) TIMES DAILY. TO DECREASE STOMACH ACID 60 capsule 1  . omeprazole (PRILOSEC) 40 MG capsule TAKE 1 CAPSULE (40 MG TOTAL) BY MOUTH 2 (TWO) TIMES DAILY. TO DECREASE STOMACH ACID 60 capsule 1  . Vitamin D, Cholecalciferol, 25 MCG (1000 UT) TABS Take 1 tablet by mouth once daily 90 tablet 0   No current facility-administered medications on file prior to visit.    Allergies  Allergen Reactions  . Lisinopril Swelling  . Potassium Chloride Shortness Of Breath and Swelling    Tolerates IV KCl, reaction only to PO product  . Pneumococcal Vaccines Swelling and Other (See Comments)    Reaction:  Swelling at injection site  . Vicodin [Hydrocodone-Acetaminophen] Itching and Rash    Social History   Socioeconomic History  . Marital status: Legally Separated    Spouse name: Not on file  . Number of children: Not on file  . Years of education: Not on file  . Highest education  level: Not on file  Occupational History  . Not on file  Tobacco Use  . Smoking status: Current Every Day Smoker    Packs/day: 0.25    Years: 14.00    Pack years: 3.50    Types: Cigarettes  . Smokeless tobacco: Never Used  Vaping Use  . Vaping Use: Never used  Substance and Sexual Activity  . Alcohol use: Yes    Comment: occ  . Drug use: No  . Sexual activity: Yes    Birth control/protection: Surgical  Other Topics Concern  . Not on file  Social History Narrative   Married, and her granddaughter lives with her, works as Quarry manager at [employer], exercise - activity at work, walking.  12/2016   Social Determinants of Health   Financial Resource Strain: Not on file  Food Insecurity: Not on file  Transportation Needs: Not on file  Physical Activity: Not on file  Stress: Not on file  Social Connections: Not on file  Intimate Partner Violence: Not on file    Family History  Problem Relation Age of Onset  . Diabetes Mother   . Hypertension Mother   . Aneurysm Mother   . Stroke Mother   . Cancer Mother        cervical cancer  . Cancer Maternal Aunt        breast  . Cancer Cousin        breast/breast  . Lupus Cousin   . Heart disease Neg Hx   . Colon cancer Neg Hx   . Allergic rhinitis Neg Hx   . Angioedema Neg Hx   . Asthma Neg Hx   . Atopy Neg Hx   . Eczema Neg Hx   . Immunodeficiency Neg Hx   . Urticaria Neg Hx     Past Surgical History:  Procedure Laterality Date  . BACK SURGERY  10/24/2020  . CARDIAC CATHETERIZATION    . CARPAL TUNNEL RELEASE Right 08/30/2020   Procedure: RIGHT CARPAL TUNNEL RELEASE;  Surgeon: Leandrew Koyanagi, MD;  Location: [facility];  Service: Orthopedics;  Laterality: Right;  . COLONOSCOPY  03/13/2016  Dr. Carlean Purl, normal, repeat 2027  . CYST EXCISION     left neck/postauricular region, benign  . DILATION AND CURETTAGE OF UTERUS    . LAPAROSCOPIC ABDOMINAL EXPLORATION     removal of ectopic preg  . LEFT HEART CATH AND  CORONARY ANGIOGRAPHY N/A 06/23/2017   Procedure: LEFT HEART CATH AND CORONARY ANGIOGRAPHY;  Surgeon: Nelva Bush, MD;  Location: [facility];  Service: Cardiovascular;  Laterality: N/A;  . TUBAL LIGATION      ROS: Review of Systems Negative except as stated above  PHYSICAL EXAM: BP (!) 144/75   Pulse 91   Resp 20   Ht 5\' 11"  (1.803 m)   Wt 274 lb 3.2 oz (124.4 kg)   LMP 06/29/2015 (Exact Date)   SpO2 95%   BMI 38.24 kg/m   Physical Exam  General appearance - alert, well appearing, and in no distress Mental status - normal mood, behavior, speech, dress, motor activity, and thought processes Breasts -RN: Caleen Jobs present for breast and pelvic exams:  breasts pendulous appear normal, no suspicious masses, no skin or nipple changes or axillary nodes Pelvic - normal external genitalia, vulva, vagina, cervix, uterus and adnexa.  Small amount of white dischg on lower half of cervix   CMP Latest Ref Rng & Units 01/30/2021 10/27/2020 10/26/2020  Glucose 70 - 99 mg/dL 98 146(H) 115(H)  BUN 6 - 20 mg/dL 12 6 7   Creatinine 0.44 - 1.00 mg/dL 0.70 0.68 0.61  Sodium 135 - 145 mmol/L 143 140 140  Potassium 3.5 - 5.1 mmol/L 3.1(L) 2.8(L) 3.2(L)  Chloride 98 - 111 mmol/L 101 99 103  CO2 22 - 32 mmol/L 30 28 27   Calcium 8.9 - 10.3 mg/dL 9.3 8.5(L) 8.4(L)  Total Protein 6.5 - 8.1 g/dL - - -  Total Bilirubin 0.3 - 1.2 mg/dL - - -  Alkaline Phos 38 - 126 U/L - - -  AST 15 - 41 U/L - - -  ALT 0 - 44 U/L - - -   Lipid Panel     Component Value Date/Time   CHOL 147 06/06/2020 1049   TRIG 273 (H) 06/06/2020 1049   HDL 35 (L) 06/06/2020 1049   CHOLHDL 4.2 06/06/2020 1049   CHOLHDL 3.4 06/20/2017 1615   VLDL 62 (H) 06/20/2017 1615   LDLCALC 68 06/06/2020 1049    CBC    Component Value Date/Time   WBC 8.6 01/30/2021 0749   RBC 4.76 01/30/2021 0749   HGB 14.0 01/30/2021 0749   HGB 13.7 06/06/2020 1049   HCT 43.7 01/30/2021 0749   HCT 40.4 06/06/2020 1049   PLT 289  01/30/2021 0749   PLT 258 06/06/2020 1049   MCV 91.8 01/30/2021 0749   MCV 92 06/06/2020 1049   MCH 29.4 01/30/2021 0749   MCHC 32.0 01/30/2021 0749   RDW 17.0 (H) 01/30/2021 0749   RDW 13.2 06/06/2020 1049   LYMPHSABS 2.0 10/27/2020 0420   LYMPHSABS 2.1 06/06/2020 1049   MONOABS 1.2 (H) 10/27/2020 0420   EOSABS 0.0 10/27/2020 0420   EOSABS 0.2 06/06/2020 1049   BASOSABS 0.0 10/27/2020 0420   BASOSABS 0.0 06/06/2020 1049    ASSESSMENT AND PLAN: 1. Pap smear for cervical cancer screening - Cytology - PAP - Cervicovaginal ancillary only  2. Encounter for screening mammogram for malignant neoplasm of breast Given info for [facility] program - MM Digital Screening; Future    Patient was given the opportunity to ask questions.  Patient verbalized understanding of the  plan and was able to repeat key elements of the plan.   No orders of the defined types were placed in this encounter.    Requested Prescriptions    No prescriptions requested or ordered in this encounter    No follow-ups on file.  Karle Plumber, MD, FACP

## 2021-02-16 NOTE — Assessment & Plan Note (Signed)
Past history - Perennial rhinoconjunctivitis symptoms for 30 years with worsening in the spring. 2021 skin testing showed: Positive to grass, weed, ragweed, trees, dust mites. Borderline to feathers.  Interim history - increased symptoms.  Continue environmental control measures as below.  May use over the counter antihistamines such as Allegra (fexofenadine) daily as needed. May take twice a day during flares if needed.  May use olopatadine eye drops 0.2% once a day as needed for itchy/watery eyes. Start Singulair (montelukast) 10mg  daily at night. Cautioned that in some children/adults can experience behavioral changes including hyperactivity, agitation, depression, sleep disturbances and suicidal ideations. These side effects are rare, but if you notice them you should notify me and discontinue Singulair (montelukast).  May use Flonase (fluticasone) nasal spray 1 spray per nostril twice a day as needed for nasal congestion.   Nasal saline spray (i.e., Simply Saline) or nasal saline lavage (i.e., NeilMed) is recommended as needed and prior to medicated nasal sprays.  Consider allergy injections for long term control if above medications do not help the symptoms - handout given.

## 2021-02-16 NOTE — Patient Instructions (Addendum)
Asthma/COPD:  . Keep working on quitting smoking.  . Start prednisone taper.  Prednisone 10mg  tablet pack - keep a close on your sugars. 2 tablets given in office today. Take 2 more tablets before bed today.  Then take 2 tablets twice a day for 2 more days. Then take 2 tablets once a day for 1 day. Then take 1 tablet once a day for 1 day.   . Daily controller medication(s): continue Trelegy 220mcg 1 puff once a day. . May use albuterol rescue inhaler 2 puffs or nebulizer every 4 to 6 hours as needed for shortness of breath, chest tightness, coughing, and wheezing. May use albuterol rescue inhaler 2 puffs 5 to 15 minutes prior to strenuous physical activities. Monitor frequency of use.  . Asthma control goals:  o Full participation in all desired activities (may need albuterol before activity) o Albuterol use two times or less a week on average (not counting use with activity) o Cough interfering with sleep two times or less a month o Oral steroids no more than once a year o No hospitalizations  Environmental allergies  Past skin testing showed: Positive to grass, weed, ragweed, trees, dust mites. Borderline to feathers.   Continue environmental control measures as below.  May use over the counter antihistamines such as Allegra (fexofenadine) daily as needed. May take twice a day during flares if needed.  May use olopatadine eye drops 0.2% once a day as needed for itchy/watery eyes. Start Singulair (montelukast) 10mg  daily at night. Cautioned that in some children/adults can experience behavioral changes including hyperactivity, agitation, depression, sleep disturbances and suicidal ideations. These side effects are rare, but if you notice them you should notify me and discontinue Singulair (montelukast).  May use Flonase (fluticasone) nasal spray 1 spray per nostril twice a day as needed for nasal congestion.   Nasal saline spray (i.e., Simply Saline) or nasal saline lavage (i.e.,  NeilMed) is recommended as needed and prior to medicated nasal sprays.  Consider allergy injections for long term control if above medications do not help the symptoms - handout given.    Follow up in 1 months or sooner if needed.   Reducing Pollen Exposure . Pollen seasons: trees (spring), grass (summer) and ragweed/weeds (fall). Marland Kitchen Keep windows closed in your home and car to lower pollen exposure.  Susa Simmonds air conditioning in the bedroom and throughout the house if possible.  . Avoid going out in dry windy days - especially early morning. . Pollen counts are highest between 5 - 10 AM and on dry, hot and windy days.  . Save outside activities for late afternoon or after a heavy rain, when pollen levels are lower.  . Avoid mowing of grass if you have grass pollen allergy. Marland Kitchen Be aware that pollen can also be transported indoors on people and pets.  . Dry your clothes in an automatic dryer rather than hanging them outside where they might collect pollen.  . Rinse hair and eyes before bedtime. Control of House Dust Mite Allergen . Dust mite allergens are a common trigger of allergy and asthma symptoms. While they can be found throughout the house, these microscopic creatures thrive in warm, humid environments such as bedding, upholstered furniture and carpeting. . Because so much time is spent in the bedroom, it is essential to reduce mite levels there.  . Encase pillows, mattresses, and box springs in special allergen-proof fabric covers or airtight, zippered plastic covers.  . Bedding should be washed weekly in hot  water (130 F) and dried in a hot dryer. Allergen-proof covers are available for comforters and pillows that can't be regularly washed.  Wendee Copp the allergy-proof covers every few months. Minimize clutter in the bedroom. Keep pets out of the bedroom.  Marland Kitchen Keep humidity less than 50% by using a dehumidifier or air conditioning. You can buy a humidity measuring device called a hygrometer  to monitor this.  . If possible, replace carpets with hardwood, linoleum, or washable area rugs. If that's not possible, vacuum frequently with a vacuum that has a HEPA filter. . Remove all upholstered furniture and non-washable window drapes from the bedroom. . Remove all non-washable stuffed toys from the bedroom.  Wash stuffed toys weekly.

## 2021-02-16 NOTE — Assessment & Plan Note (Signed)
Past history - Diagnosed with asthma 8 years ago but symptoms worse the last year. Main triggers include allergies, infections and weather changes.  Patient is a current smoker.  Takes omeprazole for reflux. 2021 spirometry consistent with mixed restriction and obstruction with no improvement in FEV1 post bronchodilator treatment. Clinically feeling improved and wheezing resolved.  Interim history - Doing better with Trelegy and no flares until recently. Finished prednisone but now having worsening symptoms again. . Encouraged smoking cessation. . Today's spirometry showed some restriction. . Start prednisone taper - monitor BGM. Marland Kitchen Daily controller medication(s): continue Trelegy 233mcg 1 puff once a day. . May use albuterol rescue inhaler 2 puffs or nebulizer every 4 to 6 hours as needed for shortness of breath, chest tightness, coughing, and wheezing. May use albuterol rescue inhaler 2 puffs 5 to 15 minutes prior to strenuous physical activities. Monitor frequency of use.   Repeat spirometry at next visit. . If no improvement in symptoms will consider adding biologics treatment next.

## 2021-02-19 ENCOUNTER — Other Ambulatory Visit: Payer: Self-pay | Admitting: Internal Medicine

## 2021-02-19 ENCOUNTER — Other Ambulatory Visit: Payer: Self-pay

## 2021-02-19 LAB — CERVICOVAGINAL ANCILLARY ONLY
Bacterial Vaginitis (gardnerella): POSITIVE — AB
Candida Glabrata: NEGATIVE
Candida Vaginitis: NEGATIVE
Chlamydia: NEGATIVE
Comment: NEGATIVE
Comment: NEGATIVE
Comment: NEGATIVE
Comment: NEGATIVE
Comment: NEGATIVE
Comment: NORMAL
Neisseria Gonorrhea: NEGATIVE
Trichomonas: NEGATIVE

## 2021-02-19 MED ORDER — METRONIDAZOLE 500 MG PO TABS
500.0000 mg | ORAL_TABLET | Freq: Two times a day (BID) | ORAL | 0 refills | Status: DC
  Filled 2021-02-19: qty 14, 7d supply, fill #0

## 2021-02-20 ENCOUNTER — Other Ambulatory Visit: Payer: Self-pay

## 2021-02-20 ENCOUNTER — Telehealth: Payer: Self-pay

## 2021-02-20 NOTE — Telephone Encounter (Signed)
Pt returned call to go over lab results pt is aware and doesn't have any questions or concerns  

## 2021-02-20 NOTE — Telephone Encounter (Signed)
Contacted pt to go over lab results pt didn't answer lvm  

## 2021-02-21 LAB — CYTOLOGY - PAP
Comment: NEGATIVE
Diagnosis: NEGATIVE
High risk HPV: NEGATIVE

## 2021-02-22 ENCOUNTER — Telehealth: Payer: Self-pay

## 2021-02-22 NOTE — Telephone Encounter (Signed)
Contacted pt to go over pap results pt is aware and doesn't have any questions or concerns  

## 2021-02-26 ENCOUNTER — Other Ambulatory Visit: Payer: Self-pay

## 2021-02-26 MED FILL — Gabapentin Cap 100 MG: ORAL | 30 days supply | Qty: 180 | Fill #0 | Status: AC

## 2021-02-26 MED FILL — Hydrochlorothiazide Tab 25 MG: ORAL | 30 days supply | Qty: 30 | Fill #0 | Status: AC

## 2021-02-28 ENCOUNTER — Other Ambulatory Visit: Payer: Self-pay

## 2021-03-03 MED FILL — Amlodipine Besylate Tab 10 MG (Base Equivalent): ORAL | 30 days supply | Qty: 30 | Fill #1 | Status: AC

## 2021-03-05 ENCOUNTER — Other Ambulatory Visit: Payer: Self-pay

## 2021-03-08 ENCOUNTER — Other Ambulatory Visit: Payer: Self-pay

## 2021-03-08 MED FILL — Acyclovir Cap 200 MG: ORAL | 30 days supply | Qty: 60 | Fill #0 | Status: AC

## 2021-03-08 MED FILL — Atorvastatin Calcium Tab 40 MG (Base Equivalent): ORAL | 30 days supply | Qty: 30 | Fill #1 | Status: AC

## 2021-03-12 ENCOUNTER — Other Ambulatory Visit: Payer: Self-pay

## 2021-03-12 MED FILL — Metformin HCl Tab 500 MG: ORAL | 30 days supply | Qty: 60 | Fill #0 | Status: CN

## 2021-03-16 ENCOUNTER — Encounter: Payer: Self-pay | Admitting: Specialist

## 2021-03-16 ENCOUNTER — Ambulatory Visit (INDEPENDENT_AMBULATORY_CARE_PROVIDER_SITE_OTHER): Payer: Self-pay | Admitting: Specialist

## 2021-03-16 ENCOUNTER — Other Ambulatory Visit: Payer: Self-pay

## 2021-03-16 VITALS — BP 134/86 | HR 106 | Ht 71.0 in | Wt 276.0 lb

## 2021-03-16 DIAGNOSIS — M25462 Effusion, left knee: Secondary | ICD-10-CM

## 2021-03-16 DIAGNOSIS — Q682 Congenital deformity of knee: Secondary | ICD-10-CM

## 2021-03-16 DIAGNOSIS — M2242 Chondromalacia patellae, left knee: Secondary | ICD-10-CM

## 2021-03-16 NOTE — Progress Notes (Signed)
Office Visit Note   Patient: Sydney Williams           Date of Birth: Nov 06, 1965           MRN: [id number] Visit Date: 03/16/2021              Requested by: Elsie Stain, MD [address],  [place] [id number] PCP: Elsie Stain, MD   Assessment & Plan: Visit Diagnoses:  1. Patella alta   2. Effusion, left knee   3. Chondromalacia patellae, left knee     Plan: The main ways of treat osteoarthritis, that are found to be success. Weight loss helps to decrease pain. Exercise is important to maintaining cartilage and thickness and strengthening. NSAIDs like motrin, tylenol, alleve are meds decreasing the inflamation. Ice is okay  In afternoon and evening and hot shower in the am Recommend going to PT to learn terminal quadriceps strengthening exercises.   Follow-Up Instructions: Return if symptoms worsen or fail to improve.   Orders:  No orders of the defined types were placed in this encounter.  No orders of the defined types were placed in this encounter.     Procedures: No procedures performed   Clinical Data: No additional findings.   Subjective: Chief Complaint  Patient presents with  . Left Knee - Follow-up    Had injection on 01/22/21 and it did good for her.    HPI  Review of Systems  Constitutional: Negative.   HENT: Negative.   Eyes: Negative.   Respiratory: Negative.   Cardiovascular: Negative.   Gastrointestinal: Negative.   Endocrine: Negative.   Genitourinary: Negative.   Musculoskeletal: Negative.   Skin: Negative.   Allergic/Immunologic: Negative.   Neurological: Negative.   Hematological: Negative.   Psychiatric/Behavioral: Negative.      Objective: Vital Signs: BP 134/86 (BP Location: Left Arm, Patient Position: Sitting)   Pulse (!) 106   Ht 5\' 11"  (1.803 m)   Wt 276 lb (125.2 kg)   LMP 06/29/2015 (Exact Date)   BMI 38.49 kg/m   Physical Exam Constitutional:      Appearance: She is well-developed.  HENT:      Head: Normocephalic and atraumatic.  Eyes:     Pupils: Pupils are equal, round, and reactive to light.  Pulmonary:     Effort: Pulmonary effort is normal.     Breath sounds: Normal breath sounds.  Abdominal:     General: Bowel sounds are normal.     Palpations: Abdomen is soft.  Musculoskeletal:        General: Normal range of motion.     Cervical back: Normal range of motion and neck supple.     Left knee: No effusion.     Instability Tests: Medial McMurray test negative and lateral McMurray test negative.  Skin:    General: Skin is warm and dry.  Neurological:     Mental Status: She is alert and oriented to person, place, and time.  Psychiatric:        Behavior: Behavior normal.        Thought Content: Thought content normal.        Judgment: Judgment normal.     Left Knee Exam  Left knee exam is normal.  Tenderness  The patient is experiencing tenderness in the patella and patellar tendon.  Range of Motion  Extension: normal  Flexion: normal   Tests  McMurray:  Medial - negative Lateral - negative Varus: negative Valgus: negative Lachman:  Anterior - negative    Posterior - negative Drawer:  Anterior - negative     Posterior - negative Pivot shift: negative Patellar apprehension: positive  Other  Erythema: absent Scars: absent Pulse: present Swelling: none Effusion: no effusion present      Specialty Comments:  No specialty comments available.  Imaging: No results found.   PMFS History: Patient Active Problem List   Diagnosis Date Noted  . Degenerative disc disease, lumbar   . History of lumbar spinal fusion 10/24/2020  . Seasonal and perennial allergic rhinoconjunctivitis 09/18/2020  . Carpal tunnel syndrome on right   . Seronegative rheumatoid arthritis ([place]) 08/04/2020  . Allergic conjunctivitis of both eyes 07/19/2020  . Lumbar radiculopathy, chronic 04/03/2020  . Elevated blood uric acid level 04/03/2020  . Family history of cervical  cancer 02/02/2018  . Family history of breast cancer 02/02/2018  . Vitamin D deficiency 02/02/2018  . Depression 06/19/2017  . 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 Snoring 09/11/2016  . History of migraine 09/11/2016  . Frequent headaches 09/11/2016  . Gastroesophageal reflux disease without esophagitis 06/01/2015  . Other allergic rhinitis 06/01/2015  . Tobacco user 11/30/2014  . Essential hypertension 11/30/2014  . Hyperlipidemia 11/30/2014  . Moderate persistent asthma with acute exacerbation 11/30/2014  . Obesity 07/02/2012  . Type 2 diabetes mellitus without complication, without long-term current use of insulin ([place]) 07/02/2012   Past Medical History:  Diagnosis Date  . Asthma    2015 hospitaliation for asthma  . Diabetes mellitus    Type II  . Family history of breast cancer   . Fibroids 2010  . Genital herpes   . Genital herpes 11/30/2014  . GERD (gastroesophageal reflux disease)   . Hyperlipidemia   . Hypertension   . Impaired fasting blood sugar   . Migraines   . Obesity   . Right carpal tunnel syndrome   . Smoker     Family History  Problem Relation Age of Onset  . Diabetes Mother   . Hypertension Mother   . Aneurysm Mother   . Stroke Mother   . Cancer Mother        cervical cancer  . Cancer Maternal Aunt        breast  . Cancer Cousin        breast/breast  . Lupus Cousin   . Heart disease Neg Hx   . Colon cancer Neg Hx   . Allergic rhinitis Neg Hx   . Angioedema Neg Hx   . Asthma Neg Hx   . Atopy Neg Hx   . Eczema Neg Hx   . Immunodeficiency Neg Hx   . Urticaria Neg Hx     Past Surgical History:  Procedure Laterality Date  . BACK SURGERY  10/24/2020  . CARDIAC CATHETERIZATION    . CARPAL TUNNEL RELEASE Right 08/30/2020   Procedure: RIGHT CARPAL TUNNEL RELEASE;  Surgeon: Leandrew Koyanagi, MD;  Location: [facility];  Service: Orthopedics;  Laterality: Right;  . COLONOSCOPY  03/13/2016   Dr. Carlean Purl, normal, repeat  2027  . CYST EXCISION     left neck/postauricular region, benign  . DILATION AND CURETTAGE OF UTERUS    . LAPAROSCOPIC ABDOMINAL EXPLORATION     removal of ectopic preg  . LEFT HEART CATH AND CORONARY ANGIOGRAPHY N/A 06/23/2017   Procedure: LEFT HEART CATH AND CORONARY ANGIOGRAPHY;  Surgeon: Nelva Bush, MD;  Location: [facility];  Service: Cardiovascular;  Laterality: N/A;  . TUBAL LIGATION  Social History   Occupational History  . Not on file  Tobacco Use  . Smoking status: Current Every Day Smoker    Packs/day: 0.25    Years: 14.00    Pack years: 3.50    Types: Cigarettes  . Smokeless tobacco: Never Used  Vaping Use  . Vaping Use: Never used  Substance and Sexual Activity  . Alcohol use: Yes    Comment: occ  . Drug use: No  . Sexual activity: Yes    Birth control/protection: Surgical

## 2021-03-16 NOTE — Patient Instructions (Addendum)
The main ways of treat osteoarthritis, that are found to be success. Weight loss helps to decrease pain. Exercise is important to maintaining cartilage and thickness and strengthening. NSAIDs like motrin, tylenol, alleve are meds decreasing the inflamation. Ice is okay  In afternoon and evening and hot shower in the am Recommend going to PT to learn terminal quadriceps strengthening exercises.

## 2021-03-19 ENCOUNTER — Ambulatory Visit: Payer: Self-pay | Admitting: *Deleted

## 2021-03-19 ENCOUNTER — Other Ambulatory Visit: Payer: Self-pay

## 2021-03-19 NOTE — Telephone Encounter (Addendum)
Facial edema around both eyes and cheeks for about one week. Is not impeding vision. Small dry rash, around 5-6 scattered on face-itches. Scabbed areas on left ear lobe. Right ear lobes feels wet with chewing and eating only. Denies fever. No difficulty swallowing or chewing. No toothache/earache. No change in lotions/detergents/perfumes. No yard work/bites. No new foods and no other rash on body. Takes antihistamine as night-going to replace with benadryl today, cold compress to face. Advised to see mobile unit for exam. Patient stated she would. With any difficulty swallowing/breathing she knows to go to ED.  4:45p Patient calls back and reports the mobile unit was not at the assigned location of [facility]. Stated the facial swelling has decreased but new symptom of neck pain today. She will see mobile unit at [facility] tomorrow.   Reason for Disposition . [1] Mild face swelling (puffiness) AND [2] persists > 3 days  Answer Assessment - Initial Assessment Questions 1. ONSET: "When did the swelling start?" (e.g., minutes, hours, days)     Sunday of last week. 2. LOCATION: "What part of the face is swollen?"     Around the eyes, cheeks and forehead 3. SEVERITY: "How swollen is it?"     Rash and swelling 4. ITCHING: "Is there any itching?" If Yes, ask: "How much?"   (Scale 1-10; mild, moderate or severe)     itches 5. PAIN: "Is the swelling painful to touch?" If Yes, ask: "How painful is it?"   (Scale 1-10; mild, moderate or severe)   - NONE (0): no pain   - MILD (1-3): doesn't interfere with normal activities    - MODERATE (4-7): interferes with normal activities or awakens from sleep    - SEVERE (8-10): excruciating pain, unable to do any normal activities     Only itches 6. FEVER: "Do you have a fever?" If Yes, ask: "What is it, how was it measured, and when did it start?"      none 7. CAUSE: "What do you think is causing the face swelling?"     unsure 8. RECURRENT  SYMPTOM: "Have you had face swelling before?" If Yes, ask: "When was the last time?" "What happened that time?"     Yes, went away after 2 days 9. OTHER SYMPTOMS: "Do you have any other symptoms?" (e.g., toothache, leg swelling)     none 10. PREGNANCY: "Is there any chance you are pregnant?" "When was your last menstrual period?"       no  Protocols used: [employer] [facility]

## 2021-03-20 ENCOUNTER — Ambulatory Visit: Payer: Self-pay | Admitting: Physician Assistant

## 2021-03-20 ENCOUNTER — Other Ambulatory Visit: Payer: Self-pay

## 2021-03-20 VITALS — BP 134/78 | HR 95 | Temp 98.2°F | Resp 18 | Ht 71.0 in | Wt 277.0 lb

## 2021-03-20 DIAGNOSIS — L309 Dermatitis, unspecified: Secondary | ICD-10-CM

## 2021-03-20 DIAGNOSIS — E119 Type 2 diabetes mellitus without complications: Secondary | ICD-10-CM

## 2021-03-20 LAB — POCT GLYCOSYLATED HEMOGLOBIN (HGB A1C): Hemoglobin A1C: 7.3 % — AB (ref 4.0–5.6)

## 2021-03-20 LAB — GLUCOSE, POCT (MANUAL RESULT ENTRY): POC Glucose: 191 mg/dl — AB (ref 70–99)

## 2021-03-20 MED ORDER — PREDNISONE 20 MG PO TABS
40.0000 mg | ORAL_TABLET | Freq: Every day | ORAL | 0 refills | Status: AC
  Filled 2021-03-20: qty 10, 5d supply, fill #0

## 2021-03-20 NOTE — Patient Instructions (Signed)
I encourage you to do the trial of prednisone, continue Tylenol and ibuprofen as needed.  Make sure to keep your follow-up appointment with the allergy specialist.  Your A1c is 7.3, I encourage you to follow a low sugar diet.  Make sure to make an appointment with your primary care provider for routine health maintenance.  Please let us know if there is anything else we can do for you  Sydney Rad, PA-C Physician Assistant [place] [email or web address]    https://www.diabeteseducator.org/docs/default-source/living-with-diabetes/conquering-the-grocery-store-v1.pdf?sfvrsn=4">  Carbohydrate Counting for Diabetes Mellitus, Adult Carbohydrate counting is a method of keeping track of how many carbohydrates you eat. Eating carbohydrates naturally increases the amount of sugar (glucose) in the blood. Counting how many carbohydrates you eat improves your blood glucose control, which helps you manage your diabetes. It is important to know how many carbohydrates you can safely have in each meal. This is different for every person. A dietitian can help you make a meal plan and calculate how many carbohydrates you should have at each meal and snack. What foods contain carbohydrates? Carbohydrates are found in the following foods:  Grains, such as breads and cereals.  Dried beans and soy products.  Starchy vegetables, such as potatoes, peas, and corn.  Fruit and fruit juices.  Milk and yogurt.  Sweets and snack foods, such as cake, cookies, candy, chips, and soft drinks.   How do I count carbohydrates in foods? There are two ways to count carbohydrates in food. You can read food labels or learn standard serving sizes of foods. You can use either of the methods or a combination of both. Using the Nutrition Facts label The Nutrition Facts list is included on the labels of almost all packaged foods and beverages in the [place] It  includes:  The serving size.  Information about nutrients in each serving, including the grams (g) of carbohydrate per serving. To use the Nutrition Facts:  Decide how many servings you will have.  Multiply the number of servings by the number of carbohydrates per serving.  The resulting number is the total amount of carbohydrates that you will be having. Learning the standard serving sizes of foods When you eat carbohydrate foods that are not packaged or do not include Nutrition Facts on the label, you need to measure the servings in order to count the amount of carbohydrates.  Measure the foods that you will eat with a food scale or measuring cup, if needed.  Decide how many standard-size servings you will eat.  Multiply the number of servings by 15. For foods that contain carbohydrates, one serving equals 15 g of carbohydrates. ? For example, if you eat 2 cups or 10 oz (300 g) of strawberries, you will have eaten 2 servings and 30 g of carbohydrates (2 servings x 15 g = 30 g).  For foods that have more than one food mixed, such as soups and casseroles, you must count the carbohydrates in each food that is included. The following list contains standard serving sizes of common carbohydrate-rich foods. Each of these servings has about 15 g of carbohydrates:  1 slice of bread.  1 six-inch (15 cm) tortilla.  ? cup or 2 oz (53 g) cooked rice or pasta.   cup or 3 oz (85 g) cooked or canned, drained and rinsed beans or lentils.   cup or 3 oz (85 g) starchy vegetable, such as peas, corn, or squash.   cup or 4 oz (120 g) hot cereal.  cup or 3 oz (85 g) boiled or mashed potatoes, or  or 3 oz (85 g) of a large baked potato.   cup or 4 fl oz (118 mL) fruit juice.  1 cup or 8 fl oz (237 mL) milk.  1 small or 4 oz (106 g) apple.   or 2 oz (63 g) of a medium banana.  1 cup or 5 oz (150 g) strawberries.  3 cups or 1 oz (24 g) popped popcorn. What is an example of  carbohydrate counting? To calculate the number of carbohydrates in this sample meal, follow the steps shown below. Sample meal  3 oz (85 g) chicken breast.  ? cup or 4 oz (106 g) brown rice.   cup or 3 oz (85 g) corn.  1 cup or 8 fl oz (237 mL) milk.  1 cup or 5 oz (150 g) strawberries with sugar-free whipped topping. Carbohydrate calculation 1. Identify the foods that contain carbohydrates: ? Rice. ? Corn. ? Milk. ? Strawberries. 2. Calculate how many servings you have of each food: ? 2 servings rice. ? 1 serving corn. ? 1 serving milk. ? 1 serving strawberries. 3. Multiply each number of servings by 15 g: ? 2 servings rice x 15 g = 30 g. ? 1 serving corn x 15 g = 15 g. ? 1 serving milk x 15 g = 15 g. ? 1 serving strawberries x 15 g = 15 g. 4. Add together all of the amounts to find the total grams of carbohydrates eaten: ? 30 g + 15 g + 15 g + 15 g = 75 g of carbohydrates total. What are tips for following this plan? Shopping  Develop a meal plan and then make a shopping list.  Buy fresh and frozen vegetables, fresh and frozen fruit, dairy, eggs, beans, lentils, and whole grains.  Look at food labels. Choose foods that have more fiber and less sugar.  Avoid processed foods and foods with added sugars. Meal planning  Aim to have the same amount of carbohydrates at each meal and for each snack time.  Plan to have regular, balanced meals and snacks. Where to find more information  American Diabetes Association: www.diabetes.org  Centers for Disease Control and Prevention: http://www.wolf.info/ Summary  Carbohydrate counting is a method of keeping track of how many carbohydrates you eat.  Eating carbohydrates naturally increases the amount of sugar (glucose) in the blood.  Counting how many carbohydrates you eat improves your blood glucose control, which helps you manage your diabetes.  A dietitian can help you make a meal plan and calculate how many carbohydrates you  should have at each meal and snack. This information is not intended to replace advice given to you by your health care provider. Make sure you discuss any questions you have with your health care provider. Document Revised: 10/14/2019 Document Reviewed: 10/15/2019 Elsevier Patient Education  2021 [employer].

## 2021-03-20 NOTE — Progress Notes (Signed)
Patient reports discomfort in the past 2 weeks throughout the day. Patient reports  Itching of the right ear. Patient reports when a liquid on the left side of her face after eating. Patient remembers her face breaking out in [place] from the heat. Patient uses dove and cocoa butter only. Patient has taken medication and has eaten today.

## 2021-03-20 NOTE — Progress Notes (Signed)
Established Patient Office Visit  Subjective:  Patient ID: Sydney Williams, female    DOB: 09/28/1966  Age: 55 y.o. MRN: [id number]  CC:  Chief Complaint  Patient presents with  . Generalized Body Aches    HPI SYLVAN LAHM reports that she has been having rough patches on her face and on the exterior of her right ear for the past 2 weeks.  Reports that she feels her face is puffy, states that her eyes are tender to the touch in the tear duct area.  Reports that she is also experiencing wetness on the left side of her face after every meal.  Reports she is also having pain in the back of her neck.  Reports that she has tried Tylenol and ibuprofen with some relief of the pain.  Reports the rash has itchy.  Reports that she does take her daily allergy medication  Denies any new medications, body washes, lotions, detergents, fragrances  Does endorse that she has been eating more sweets recently.  Has not been checking her blood glucose levels at home, does endorse compliance to diabetes medications.  Reports that she eats a diet high in potassium, states that she is allergic to oral potassium.    Past Medical History:  Diagnosis Date  . Asthma    2015 hospitaliation for asthma  . Diabetes mellitus    Type II  . Family history of breast cancer   . Fibroids 2010  . Genital herpes   . Genital herpes 11/30/2014  . GERD (gastroesophageal reflux disease)   . Hyperlipidemia   . Hypertension   . Impaired fasting blood sugar   . Migraines   . Obesity   . Right carpal tunnel syndrome   . Smoker     Past Surgical History:  Procedure Laterality Date  . BACK SURGERY  10/24/2020  . CARDIAC CATHETERIZATION    . CARPAL TUNNEL RELEASE Right 08/30/2020   Procedure: RIGHT CARPAL TUNNEL RELEASE;  Surgeon: Leandrew Koyanagi, MD;  Location: [facility];  Service: Orthopedics;  Laterality: Right;  . COLONOSCOPY  03/13/2016   Dr. Carlean Purl, normal, repeat 2027  . CYST EXCISION     left  neck/postauricular region, benign  . DILATION AND CURETTAGE OF UTERUS    . LAPAROSCOPIC ABDOMINAL EXPLORATION     removal of ectopic preg  . LEFT HEART CATH AND CORONARY ANGIOGRAPHY N/A 06/23/2017   Procedure: LEFT HEART CATH AND CORONARY ANGIOGRAPHY;  Surgeon: Nelva Bush, MD;  Location: [facility];  Service: Cardiovascular;  Laterality: N/A;  . TUBAL LIGATION      Family History  Problem Relation Age of Onset  . Diabetes Mother   . Hypertension Mother   . Aneurysm Mother   . Stroke Mother   . Cancer Mother        cervical cancer  . Cancer Maternal Aunt        breast  . Cancer Cousin        breast/breast  . Lupus Cousin   . Heart disease Neg Hx   . Colon cancer Neg Hx   . Allergic rhinitis Neg Hx   . Angioedema Neg Hx   . Asthma Neg Hx   . Atopy Neg Hx   . Eczema Neg Hx   . Immunodeficiency Neg Hx   . Urticaria Neg Hx     Social History   Socioeconomic History  . Marital status: Legally Separated    Spouse name: Not on file  .  Number of children: Not on file  . Years of education: Not on file  . Highest education level: Not on file  Occupational History  . Not on file  Tobacco Use  . Smoking status: Current Every Day Smoker    Packs/day: 0.25    Years: 14.00    Pack years: 3.50    Types: Cigarettes  . Smokeless tobacco: Never Used  Vaping Use  . Vaping Use: Never used  Substance and Sexual Activity  . Alcohol use: Yes    Comment: occ  . Drug use: No  . Sexual activity: Yes    Birth control/protection: Surgical  Other Topics Concern  . Not on file  Social History Narrative   Married, and her granddaughter lives with her, works as Quarry manager at [employer], exercise - activity at work, walking.  12/2016   Social Determinants of Health   Financial Resource Strain: Not on file  Food Insecurity: Not on file  Transportation Needs: Not on file  Physical Activity: Not on file  Stress: Not on file  Social Connections: Not on file  Intimate Partner  Violence: Not on file    Outpatient Medications Prior to Visit  Medication Sig Dispense Refill  . acetaminophen (TYLENOL) 500 MG tablet Take 1,000 mg by mouth every 6 (six) hours as needed for moderate pain.    Marland Kitchen albuterol (VENTOLIN HFA) 108 (90 Base) MCG/ACT inhaler INHALE 1-2 PUFFS INTO THE LUNGS EVERY 6 (SIX) HOURS AS NEEDED FOR WHEEZING OR SHORTNESS OF BREATH. 18 g 0  . amLODipine (NORVASC) 10 MG tablet TAKE 1 TABLET (10 MG TOTAL) BY MOUTH DAILY. TO LOWER BLOOD PRESSURE (Patient taking differently: Take by mouth daily. to lower blood pressure) 30 tablet 0  . ascorbic acid (VITAMIN C) 500 MG tablet Take 500 mg by mouth daily.    Marland Kitchen aspirin (EQ ASPIRIN ADULT LOW DOSE) 81 MG EC tablet Take 1 tablet (81 mg total) by mouth daily. Swallow whole. 90 tablet 3  . baclofen (LIORESAL) 10 MG tablet TAKE 1-2 TABLETS (10-20 MG TOTAL) BY MOUTH 3 (THREE) TIMES DAILY AS NEEDED FOR MUSCLE SPASMS. 90 tablet 1  . fluticasone (FLONASE) 50 MCG/ACT nasal spray Place 1 spray into both nostrils 2 (two) times daily as needed for rhinitis. 16 g 5  . Fluticasone-Umeclidin-Vilant (TRELEGY ELLIPTA) 200-62.5-25 MCG/INH AEPB Inhale 1 puff into the lungs daily. Rinse mouth after each use. 60 each 5  . gabapentin (NEURONTIN) 100 MG capsule TAKE 2 IN MORNING, 2 IN LATE AFTERNOON AND 1-2 AT BEDTIME 180 capsule 3  . ibuprofen (ADVIL) 600 MG tablet TAKE 1 TABLET (600 MG TOTAL) BY MOUTH EVERY 8 (EIGHT) HOURS AS NEEDED FOR MODERATE PAIN. TAKE AFTER EATING 90 tablet 1  . metoprolol tartrate (LOPRESSOR) 50 MG tablet Take 1 tablet (50 mg total) by mouth 2 (two) times daily. 60 tablet 2  . montelukast (SINGULAIR) 10 MG tablet Take 1 tablet (10 mg total) by mouth at bedtime. 30 tablet 5  . Olopatadine HCl 0.2 % SOLN Apply 1 drop to eye daily as needed (itchy/watery eyes). 2.5 mL 5  . Vitamin D, Cholecalciferol, 25 MCG (1000 UT) TABS Take 1 tablet by mouth once daily 90 tablet 0  . hydrochlorothiazide (HYDRODIURIL) 25 MG tablet Take 1  tablet (25 mg total) by mouth daily. To lower blood pressure 90 tablet 3  . omeprazole (PRILOSEC) 40 MG capsule TAKE 1 CAPSULE (40 MG TOTAL) BY MOUTH 2 (TWO) TIMES DAILY. TO DECREASE STOMACH ACID 60 capsule 1  .  acetaminophen-codeine (TYLENOL #3) 300-30 MG tablet Take 1 tablet by mouth in the morning and at bedtime. (Patient not taking: No sig reported)    . acyclovir (ZOVIRAX) 200 MG capsule TAKE 1 CAPSULE (200 MG TOTAL) BY MOUTH 2 (TWO) TIMES DAILY. (Patient not taking: Reported on 03/20/2021) 180 capsule 3  . allopurinol (ZYLOPRIM) 100 MG tablet TAKE 1 TABLET (100 MG TOTAL) BY MOUTH DAILY. (Patient not taking: No sig reported) 30 tablet 0  . atorvastatin (LIPITOR) 40 MG tablet TAKE 1 TABLET (40 MG TOTAL) BY MOUTH DAILY. (Patient not taking: No sig reported) 30 tablet 2  . cetirizine (ZYRTEC ALLERGY) 10 MG tablet Take 1 tablet (10 mg total) by mouth daily. (Patient not taking: No sig reported) 10 tablet 0  . diphenhydrAMINE (BENADRYL) 25 MG tablet Take 1 tablet (25 mg total) by mouth every 6 (six) hours as needed. (Patient not taking: No sig reported) 30 tablet 0  . furosemide (LASIX) 20 MG tablet TAKE 1 TABLET (20 MG TOTAL) BY MOUTH DAILY AS NEEDED FOR EDEMA. (Patient not taking: No sig reported) 30 tablet 1  . hydrochlorothiazide (HYDRODIURIL) 25 MG tablet TAKE 1 TABLET (25 MG TOTAL) BY MOUTH DAILY. TO LOWER BLOOD PRESSURE (Patient not taking: Reported on 02/16/2021) 90 tablet 3  . metFORMIN (GLUCOPHAGE) 500 MG tablet TAKE 1 TABLET (500 MG TOTAL) BY MOUTH 2 (TWO) TIMES DAILY WITH A MEAL. (Patient not taking: No sig reported) 90 tablet 1  . omeprazole (PRILOSEC) 40 MG capsule TAKE 1 CAPSULE (40 MG TOTAL) BY MOUTH 2 (TWO) TIMES DAILY. TO DECREASE STOMACH ACID (Patient not taking: No sig reported) 60 capsule 1  . acyclovir (ZOVIRAX) 200 MG capsule Take 1 capsule (200 mg total) by mouth 2 (two) times daily. (Patient not taking: No sig reported) 180 capsule 3  . acyclovir (ZOVIRAX) 200 MG capsule TAKE 1  CAPSULE (200 MG TOTAL) BY MOUTH 2 (TWO) TIMES DAILY. (Patient not taking: No sig reported) 180 capsule 3  . allopurinol (ZYLOPRIM) 100 MG tablet TAKE 1 TABLET (100 MG TOTAL) BY MOUTH DAILY. 30 tablet 6  . amLODipine (NORVASC) 10 MG tablet Take 1 tablet (10 mg total) by mouth daily. To lower blood pressure (Patient not taking: Reported on 02/16/2021) 90 tablet 2  . amLODipine (NORVASC) 10 MG tablet TAKE 1 TABLET (10 MG TOTAL) BY MOUTH DAILY. TO LOWER BLOOD PRESSURE 90 tablet 2  . amLODipine (NORVASC) 10 MG tablet TAKE 1 TABLET (10 MG TOTAL) BY MOUTH DAILY. TO LOWER BLOOD PRESSURE (Patient not taking: Reported on 02/16/2021) 30 tablet 0  . amLODipine (NORVASC) 10 MG tablet TAKE 1 TABLET (10 MG TOTAL) BY MOUTH DAILY. TO LOWER BLOOD PRESSURE (Patient not taking: Reported on 02/16/2021) 30 tablet 0  . amLODipine (NORVASC) 10 MG tablet TAKE 1 TABLET (10 MG TOTAL) BY MOUTH DAILY. TO LOWER BLOOD PRESSURE (Patient not taking: Reported on 02/16/2021) 90 tablet 0  . atorvastatin (LIPITOR) 40 MG tablet Take 1 tablet (40 mg total) by mouth daily. 90 tablet 2  . atorvastatin (LIPITOR) 40 MG tablet TAKE 1 TABLET (40 MG TOTAL) BY MOUTH DAILY. (Patient not taking: Reported on 02/16/2021) 90 tablet 2  . atorvastatin (LIPITOR) 40 MG tablet TAKE 1 TABLET (40 MG TOTAL) BY MOUTH DAILY. (Patient not taking: Reported on 02/16/2021) 30 tablet 2  . atorvastatin (LIPITOR) 40 MG tablet TAKE 1 TABLET (40 MG TOTAL) BY MOUTH DAILY. (Patient not taking: No sig reported) 30 tablet 2  . Fluticasone-Umeclidin-Vilant 200-62.5-25 MCG/INH AEPB INHALE 1 PUFF INTO THE  LUNGS DAILY. RINSE MOUTH AFTER EACH USE. NO MORE REFILS UNTIL SEEN (Patient not taking: Reported on 02/16/2021) 60 each 0  . furosemide (LASIX) 20 MG tablet Take 1 tablet (20 mg total) by mouth daily as needed for edema. 30 tablet 1  . hydrochlorothiazide (HYDRODIURIL) 25 MG tablet TAKE 1 TABLET (25 MG TOTAL) BY MOUTH DAILY. TO LOWER BLOOD PRESSURE (Patient not taking: Reported on  02/16/2021) 90 tablet 3  . ibuprofen (ADVIL) 600 MG tablet TAKE 1 TABLET (600 MG TOTAL) BY MOUTH EVERY 8 (EIGHT) HOURS AS NEEDED FOR MODERATE PAIN. TAKE AFTER EATING 90 tablet 1  . metFORMIN (GLUCOPHAGE) 500 MG tablet Take 1 tablet (500 mg total) by mouth 2 (two) times daily with a meal. 180 tablet 1  . metFORMIN (GLUCOPHAGE) 500 MG tablet TAKE 1 TABLET (500 MG TOTAL) BY MOUTH 2 (TWO) TIMES DAILY WITH A MEAL. (Patient not taking: Reported on 02/16/2021) 180 tablet 1  . metFORMIN (GLUCOPHAGE) 500 MG tablet TAKE 1 TABLET (500 MG TOTAL) BY MOUTH 2 (TWO) TIMES DAILY WITH A MEAL. (Patient not taking: Reported on 02/16/2021) 90 tablet 1  . metroNIDAZOLE (FLAGYL) 500 MG tablet Take 1 tablet (500 mg total) by mouth 2 (two) times daily. 14 tablet 0   No facility-administered medications prior to visit.    Allergies  Allergen Reactions  . Lisinopril Swelling  . Potassium Chloride Shortness Of Breath and Swelling    Tolerates IV KCl, reaction only to PO product  . Pneumococcal Vaccines Swelling and Other (See Comments)    Reaction:  Swelling at injection site  . Vicodin [Hydrocodone-Acetaminophen] Itching and Rash    ROS Review of Systems  Constitutional: Negative for chills and fever.  HENT: Negative for ear discharge, postnasal drip, sinus pressure and sore throat.   Eyes: Positive for pain. Negative for photophobia and visual disturbance.  Respiratory: Negative for shortness of breath.   Cardiovascular: Negative for chest pain.  Gastrointestinal: Negative.   Endocrine: Negative.   Genitourinary: Negative.   Musculoskeletal: Positive for neck pain.  Skin: Negative.   Allergic/Immunologic: Negative.   Neurological: Negative.   Hematological: Negative.   Psychiatric/Behavioral: Negative.       Objective:    Physical Exam Vitals and nursing note reviewed.  Constitutional:      Appearance: Normal appearance.     Comments: Face does appear slightly swollen in general  HENT:     Head:  Normocephalic and atraumatic.     Right Ear: External ear normal.     Left Ear: External ear normal.     Nose: Nose normal.     Mouth/Throat:     Mouth: Mucous membranes are moist.     Pharynx: Oropharynx is clear.  Eyes:     General: Lids are normal.        Right eye: No discharge.        Left eye: No discharge.     Extraocular Movements: Extraocular movements intact.     Conjunctiva/sclera: Conjunctivae normal.     Right eye: Right conjunctiva is not injected.     Left eye: Left conjunctiva is not injected.     Pupils: Pupils are equal, round, and reactive to light.  Cardiovascular:     Rate and Rhythm: Normal rate and regular rhythm.     Pulses: Normal pulses.     Heart sounds: Normal heart sounds.  Pulmonary:     Effort: Pulmonary effort is normal.     Breath sounds: Normal breath sounds.  Musculoskeletal:  General: Normal range of motion.     Cervical back: Normal range of motion and neck supple. No rigidity or tenderness.  Skin:    General: Skin is warm and dry.     Findings: Rash present. Rash is scaling. Rash is not pustular.     Comments: Patches on both cheeks, scattered lesions in different stages of healing on left external ear  Neurological:     General: No focal deficit present.     Mental Status: She is alert and oriented to person, place, and time.  Psychiatric:        Mood and Affect: Mood normal.        Behavior: Behavior normal.        Thought Content: Thought content normal.        Judgment: Judgment normal.     BP 134/78 (BP Location: Left Arm, Patient Position: Sitting, Cuff Size: Normal)   Pulse 95   Temp 98.2 F (36.8 C) (Oral)   Resp 18   Ht 5\' 11"  (1.803 m)   Wt 277 lb (125.6 kg)   LMP 06/29/2015 (Exact Date)   SpO2 96%   BMI 38.63 kg/m  Wt Readings from Last 3 Encounters:  03/20/21 277 lb (125.6 kg)  03/16/21 276 lb (125.2 kg)  02/16/21 274 lb 3.2 oz (124.4 kg)     Health Maintenance Due  Topic Date Due  . OPHTHALMOLOGY  EXAM  02/03/2019  . MAMMOGRAM  02/12/2020    There are no preventive care reminders to display for this patient.  Lab Results  Component Value Date   TSH 1.21 09/11/2016   Lab Results  Component Value Date   WBC 8.6 01/30/2021   HGB 14.0 01/30/2021   HCT 43.7 01/30/2021   MCV 91.8 01/30/2021   PLT 289 01/30/2021   Lab Results  Component Value Date   NA 143 01/30/2021   K 3.1 (L) 01/30/2021   CO2 30 01/30/2021   GLUCOSE 98 01/30/2021   BUN 12 01/30/2021   CREATININE 0.70 01/30/2021   BILITOT 0.6 10/18/2020   ALKPHOS 97 10/18/2020   AST 28 10/18/2020   ALT 27 10/18/2020   PROT 7.7 10/18/2020   ALBUMIN 3.7 10/18/2020   CALCIUM 9.3 01/30/2021   ANIONGAP 12 01/30/2021   Lab Results  Component Value Date   CHOL 147 06/06/2020   Lab Results  Component Value Date   HDL 35 (L) 06/06/2020   Lab Results  Component Value Date   LDLCALC 68 06/06/2020   Lab Results  Component Value Date   TRIG 273 (H) 06/06/2020   Lab Results  Component Value Date   CHOLHDL 4.2 06/06/2020   Lab Results  Component Value Date   HGBA1C 7.3 (A) 03/20/2021      Assessment & Plan:   Problem List Items Addressed This Visit      Endocrine   Type 2 diabetes mellitus without complication, without long-term current use of insulin (HCC) - Primary   Relevant Orders   HgB A1c (Completed)   Glucose (CBG) (Completed)     Nervous and Auditory   Dermatitis of external ear   Relevant Medications   predniSONE (DELTASONE) 20 MG tablet     1. Type 2 diabetes mellitus without complication, without long-term current use of insulin (HCC) A1c increased from 6.3 three months ago to 7.3 today.  Patient education given on low sugar diet.  Patient encouraged to make appointment with primary care provider for routine health maintenance. - HgB A1c -  Glucose (CBG)  2. Dermatitis of external ear Trial prednisone, patient has upcoming appointment with allergy specialist Mar 23, 2021.  Patient  encouraged to document symptoms. - predniSONE (DELTASONE) 20 MG tablet; Take 2 tablets (40 mg total) by mouth daily with breakfast for 5 days.  Dispense: 10 tablet; Refill: 0   I have reviewed the patient's medical history (PMH, PSH, Social History, Family History, Medications, and allergies) , and have been updated if relevant. I spent 31 minutes reviewing chart and  face to face time with patient.     Meds ordered this encounter  Medications  . predniSONE (DELTASONE) 20 MG tablet    Sig: Take 2 tablets (40 mg total) by mouth daily with breakfast for 5 days.    Dispense:  10 tablet    Refill:  0    Order Specific Question:   Supervising Provider    Answer:   Elsie Stain [[id number]]    Follow-up: Return if symptoms worsen or fail to improve.    Loraine Grip Mayers, PA-C

## 2021-03-21 ENCOUNTER — Other Ambulatory Visit: Payer: Self-pay

## 2021-03-21 DIAGNOSIS — L309 Dermatitis, unspecified: Secondary | ICD-10-CM | POA: Insufficient documentation

## 2021-03-21 MED FILL — Metformin HCl Tab 500 MG: ORAL | 30 days supply | Qty: 60 | Fill #0 | Status: CN

## 2021-03-23 ENCOUNTER — Ambulatory Visit: Payer: No Typology Code available for payment source | Admitting: Allergy

## 2021-03-23 NOTE — Progress Notes (Deleted)
Follow Up Note  RE: [name] MRN: [id number] DOB: July 29, 1966 Date of Office Visit: 03/23/2021  Referring provider: Elsie Stain, MD Primary care provider: Elsie Stain, MD  Chief Complaint: No chief complaint on file.  History of Present Illness: I had the pleasure of seeing Sydney Williams for a follow up visit at the [facility] on 03/23/2021. She is a 55 y.o. female, who is being followed for asthma, allergic rhinoconjunctivitis. Her previous allergy office visit was on 02/16/2021 with Dr. Maudie Mercury. Today is a regular follow up visit.  Moderate persistent asthma with acute exacerbation Past history - Diagnosed with asthma 8 years ago but symptoms worse the last year. Main triggers include allergies, infections and weather changes.  Patient is a current smoker.  Takes omeprazole for reflux. 2021 spirometry consistent with mixed restriction and obstruction with no improvement in FEV1 post bronchodilator treatment. Clinically feeling improved and wheezing resolved.  Interim history - Doing better with Trelegy and no flares until recently. Finished prednisone but now having worsening symptoms again.  Encouraged smoking cessation.  Today's spirometry showed some restriction.  Start prednisone taper - monitor BGM.  Daily controller medication(s):continue Trelegy 232mcg 1 puff once a day.  May use albuterol rescue inhaler 2 puffs or nebulizer every 4 to 6 hours as needed for shortness of breath, chest tightness, coughing, and wheezing. May use albuterol rescue inhaler 2 puffs 5 to 15 minutes prior to strenuous physical activities. Monitor frequency of use.   Repeat spirometry at next visit.  If no improvement in symptoms will consider adding biologics treatment next.  Seasonal and perennial allergic rhinoconjunctivitis Past history - Perennial rhinoconjunctivitis symptoms for 30 years with worsening in the spring. 2021 skin testing showed: Positive to grass,  weed, ragweed, trees, dust mites. Borderline to feathers.  Interim history - increased symptoms.  Continue environmental control measures as below.  May use over the counter antihistamines such as Allegra (fexofenadine) daily as needed. May take twice a day during flares if needed.   May use olopatadine eye drops 0.2% once a day as needed for itchy/watery eyes.  Start Singulair (montelukast) 10mg  daily at night.  Cautioned that in some children/adults can experience behavioral changes including hyperactivity, agitation, depression, sleep disturbances and suicidal ideations. These side effects are rare, but if you notice them you should notify me and discontinue Singulair (montelukast).  May use Flonase (fluticasone) nasal spray 1 spray per nostril twice a day as needed for nasal congestion.   Nasal saline spray (i.e., Simply Saline) or nasal saline lavage (i.e., NeilMed) is recommended as needed and prior to medicated nasal sprays.  Consider allergy injections for long term control if above medications do not help the symptoms - handout given.   Return in about 4 weeks (around 03/16/2021).  Assessment and Plan: Sydney Williams is a 55 y.o. female with: No problem-specific Assessment & Plan notes found for this encounter.  No follow-ups on file.  No orders of the defined types were placed in this encounter.  Lab Orders  No laboratory test(s) ordered today    Diagnostics: Spirometry:  Tracings reviewed. Her effort: {Blank single:19197::"Good reproducible efforts.","It was hard to get consistent efforts and there is a question as to whether this reflects a maximal maneuver.","Poor effort, data can not be interpreted."} FVC: ***L FEV1: ***L, ***% predicted FEV1/FVC ratio: ***% Interpretation: {Blank single:19197::"Spirometry consistent with mild obstructive disease","Spirometry consistent with moderate obstructive disease","Spirometry consistent with severe obstructive disease","Spirometry  consistent with possible restrictive disease","Spirometry  consistent with mixed obstructive and restrictive disease","Spirometry uninterpretable due to technique","Spirometry consistent with normal pattern","No overt abnormalities noted given today's efforts"}.  Please see scanned spirometry results for details.  Skin Testing: {Blank single:19197::"Select foods","Environmental allergy panel","Environmental allergy panel and select foods","Food allergy panel","None","Deferred due to recent antihistamines use"}. Positive test to: ***. Negative test to: ***.  Results discussed with patient/family.   Medication List:  Current Outpatient Medications  Medication Sig Dispense Refill  . acetaminophen (TYLENOL) 500 MG tablet Take 1,000 mg by mouth every 6 (six) hours as needed for moderate pain.    Marland Kitchen acetaminophen-codeine (TYLENOL #3) 300-30 MG tablet Take 1 tablet by mouth in the morning and at bedtime. (Patient not taking: No sig reported)    . acyclovir (ZOVIRAX) 200 MG capsule TAKE 1 CAPSULE (200 MG TOTAL) BY MOUTH 2 (TWO) TIMES DAILY. (Patient not taking: Reported on 03/20/2021) 180 capsule 3  . albuterol (VENTOLIN HFA) 108 (90 Base) MCG/ACT inhaler INHALE 1-2 PUFFS INTO THE LUNGS EVERY 6 (SIX) HOURS AS NEEDED FOR WHEEZING OR SHORTNESS OF BREATH. 18 g 0  . allopurinol (ZYLOPRIM) 100 MG tablet TAKE 1 TABLET (100 MG TOTAL) BY MOUTH DAILY. (Patient not taking: No sig reported) 30 tablet 0  . amLODipine (NORVASC) 10 MG tablet TAKE 1 TABLET (10 MG TOTAL) BY MOUTH DAILY. TO LOWER BLOOD PRESSURE (Patient taking differently: Take by mouth daily. to lower blood pressure) 30 tablet 0  . ascorbic acid (VITAMIN C) 500 MG tablet Take 500 mg by mouth daily.    Marland Kitchen aspirin (EQ ASPIRIN ADULT LOW DOSE) 81 MG EC tablet Take 1 tablet (81 mg total) by mouth daily. Swallow whole. 90 tablet 3  . atorvastatin (LIPITOR) 40 MG tablet TAKE 1 TABLET (40 MG TOTAL) BY MOUTH DAILY. (Patient not taking: No sig reported) 30 tablet 2   . baclofen (LIORESAL) 10 MG tablet TAKE 1-2 TABLETS (10-20 MG TOTAL) BY MOUTH 3 (THREE) TIMES DAILY AS NEEDED FOR MUSCLE SPASMS. 90 tablet 1  . cetirizine (ZYRTEC ALLERGY) 10 MG tablet Take 1 tablet (10 mg total) by mouth daily. (Patient not taking: No sig reported) 10 tablet 0  . diphenhydrAMINE (BENADRYL) 25 MG tablet Take 1 tablet (25 mg total) by mouth every 6 (six) hours as needed. (Patient not taking: No sig reported) 30 tablet 0  . fluticasone (FLONASE) 50 MCG/ACT nasal spray Place 1 spray into both nostrils 2 (two) times daily as needed for rhinitis. 16 g 5  . Fluticasone-Umeclidin-Vilant (TRELEGY ELLIPTA) 200-62.5-25 MCG/INH AEPB Inhale 1 puff into the lungs daily. Rinse mouth after each use. 60 each 5  . furosemide (LASIX) 20 MG tablet TAKE 1 TABLET (20 MG TOTAL) BY MOUTH DAILY AS NEEDED FOR EDEMA. (Patient not taking: No sig reported) 30 tablet 1  . gabapentin (NEURONTIN) 100 MG capsule TAKE 2 IN MORNING, 2 IN LATE AFTERNOON AND 1-2 AT BEDTIME 180 capsule 3  . hydrochlorothiazide (HYDRODIURIL) 25 MG tablet TAKE 1 TABLET (25 MG TOTAL) BY MOUTH DAILY. TO LOWER BLOOD PRESSURE (Patient not taking: Reported on 02/16/2021) 90 tablet 3  . ibuprofen (ADVIL) 600 MG tablet TAKE 1 TABLET (600 MG TOTAL) BY MOUTH EVERY 8 (EIGHT) HOURS AS NEEDED FOR MODERATE PAIN. TAKE AFTER EATING 90 tablet 1  . metFORMIN (GLUCOPHAGE) 500 MG tablet TAKE 1 TABLET (500 MG TOTAL) BY MOUTH 2 (TWO) TIMES DAILY WITH A MEAL. (Patient not taking: No sig reported) 90 tablet 1  . metoprolol tartrate (LOPRESSOR) 50 MG tablet Take 1 tablet (50 mg total) by mouth  2 (two) times daily. 60 tablet 2  . montelukast (SINGULAIR) 10 MG tablet Take 1 tablet (10 mg total) by mouth at bedtime. 30 tablet 5  . Olopatadine HCl 0.2 % SOLN Apply 1 drop to eye daily as needed (itchy/watery eyes). 2.5 mL 5  . omeprazole (PRILOSEC) 40 MG capsule TAKE 1 CAPSULE (40 MG TOTAL) BY MOUTH 2 (TWO) TIMES DAILY. TO DECREASE STOMACH ACID (Patient not taking: No  sig reported) 60 capsule 1  . predniSONE (DELTASONE) 20 MG tablet Take 2 tablets (40 mg total) by mouth daily with breakfast for 5 days. 10 tablet 0  . Vitamin D, Cholecalciferol, 25 MCG (1000 UT) TABS Take 1 tablet by mouth once daily 90 tablet 0   No current facility-administered medications for this visit.   Allergies: Allergies  Allergen Reactions  . Lisinopril Swelling  . Potassium Chloride Shortness Of Breath and Swelling    Tolerates IV KCl, reaction only to PO product  . Pneumococcal Vaccines Swelling and Other (See Comments)    Reaction:  Swelling at injection site  . Vicodin [Hydrocodone-Acetaminophen] Itching and Rash   I reviewed her past medical history, social history, family history, and environmental history and no significant changes have been reported from her previous visit.  Review of Systems  Constitutional: Negative for appetite change, chills, fever and unexpected weight change.  HENT: Positive for congestion and rhinorrhea.   Eyes: Negative for itching.  Respiratory: Positive for cough. Negative for chest tightness, shortness of breath and wheezing.   Cardiovascular: Negative for chest pain.  Gastrointestinal: Negative for abdominal pain.  Genitourinary: Negative for difficulty urinating.  Skin: Negative for rash.  Allergic/Immunologic: Positive for environmental allergies. Negative for food allergies.  Neurological: Negative for headaches.   Objective: LMP 06/29/2015 (Exact Date)  There is no height or weight on file to calculate BMI. Physical Exam Vitals and nursing note reviewed.  Constitutional:      Appearance: Normal appearance. She is well-developed.  HENT:     Head: Normocephalic and atraumatic.     Right Ear: Tympanic membrane and external ear normal.     Left Ear: Tympanic membrane and external ear normal.     Nose: Congestion (on right side) present.     Mouth/Throat:     Mouth: Mucous membranes are moist.     Pharynx: Oropharynx is  clear.  Eyes:     Conjunctiva/sclera: Conjunctivae normal.  Cardiovascular:     Rate and Rhythm: Normal rate and regular rhythm.     Heart sounds: Normal heart sounds. No murmur heard. No friction rub. No gallop.   Pulmonary:     Effort: Pulmonary effort is normal.     Breath sounds: Normal breath sounds. No wheezing, rhonchi or rales.  Musculoskeletal:     Cervical back: Neck supple.  Skin:    General: Skin is warm.     Findings: No rash.  Neurological:     Mental Status: She is alert and oriented to person, place, and time.  Psychiatric:        Behavior: Behavior normal.    Previous notes and tests were reviewed. The plan was reviewed with the patient/family, and all questions/concerned were addressed.  It was my pleasure to see Lameeka today and participate in her care. Please feel free to contact me with any questions or concerns.  Sincerely,  Rexene Alberts, DO Allergy & Immunology  [facility] office: [place] office: [phone]

## 2021-03-28 ENCOUNTER — Other Ambulatory Visit: Payer: Self-pay

## 2021-03-30 MED FILL — Hydrochlorothiazide Tab 25 MG: ORAL | 30 days supply | Qty: 30 | Fill #1 | Status: AC

## 2021-04-02 ENCOUNTER — Other Ambulatory Visit: Payer: Self-pay

## 2021-04-05 ENCOUNTER — Other Ambulatory Visit: Payer: Self-pay | Admitting: Critical Care Medicine

## 2021-04-05 ENCOUNTER — Other Ambulatory Visit: Payer: Self-pay

## 2021-04-05 MED ORDER — ATORVASTATIN CALCIUM 40 MG PO TABS
ORAL_TABLET | Freq: Every day | ORAL | 0 refills | Status: DC
  Filled 2021-04-05 – 2021-04-13 (×2): qty 30, 30d supply, fill #0

## 2021-04-05 MED ORDER — AMLODIPINE BESYLATE 10 MG PO TABS
ORAL_TABLET | Freq: Every day | ORAL | 0 refills | Status: DC
  Filled 2021-04-05 – 2021-04-13 (×2): qty 30, 30d supply, fill #0

## 2021-04-06 ENCOUNTER — Other Ambulatory Visit: Payer: Self-pay

## 2021-04-06 ENCOUNTER — Ambulatory Visit: Payer: Medicaid Other | Attending: Specialist | Admitting: Physical Therapy

## 2021-04-09 ENCOUNTER — Ambulatory Visit: Payer: No Typology Code available for payment source | Admitting: Allergy

## 2021-04-09 NOTE — Progress Notes (Deleted)
Follow Up Note  RE: Sydney Williams MRN: [id number] DOB: 09-01-1966 Date of Office Visit: 04/09/2021  Referring provider: Elsie Stain, MD Primary care provider: Elsie Stain, MD  Chief Complaint: No chief complaint on file.  History of Present Illness: I had the pleasure of seeing Sydney Williams for a follow up visit at the [facility] on 04/09/2021. She is a 55 y.o. female, who is being followed for asthma and allergic rhinoconjunctivitis. Her previous allergy office visit was on 02/16/2021 with Dr. Maudie Mercury. Today is a regular follow up visit.  Moderate persistent asthma with acute exacerbation Past history - Diagnosed with asthma 8 years ago but symptoms worse the last year. Main triggers include allergies, infections and weather changes.  Patient is a current smoker.  Takes omeprazole for reflux. 2021 spirometry consistent with mixed restriction and obstruction with no improvement in FEV1 post bronchodilator treatment. Clinically feeling improved and wheezing resolved. Interim history - Doing better with Trelegy and no flares until recently. Finished prednisone but now having worsening symptoms again. Encouraged smoking cessation. Today's spirometry showed some restriction. Start prednisone taper - monitor BGM. Daily controller medication(s): continue Trelegy 276mcg 1 puff once a day. May use albuterol rescue inhaler 2 puffs or nebulizer every 4 to 6 hours as needed for shortness of breath, chest tightness, coughing, and wheezing. May use albuterol rescue inhaler 2 puffs 5 to 15 minutes prior to strenuous physical activities. Monitor frequency of use.  Repeat spirometry at next visit. If no improvement in symptoms will consider adding biologics treatment next.   Seasonal and perennial allergic rhinoconjunctivitis Past history - Perennial rhinoconjunctivitis symptoms for 30 years with worsening in the spring. 2021 skin testing showed: Positive to grass, weed, ragweed,  trees, dust mites. Borderline to feathers. Interim history - increased symptoms. Continue environmental control measures as below. May use over the counter antihistamines such as Allegra (fexofenadine) daily as needed. May take twice a day during flares if needed. May use olopatadine eye drops 0.2% once a day as needed for itchy/watery eyes. Start Singulair (montelukast) 10mg  daily at night. Cautioned that in some children/adults can experience behavioral changes including hyperactivity, agitation, depression, sleep disturbances and suicidal ideations. These side effects are rare, but if you notice them you should notify me and discontinue Singulair (montelukast). May use Flonase (fluticasone) nasal spray 1 spray per nostril twice a day as needed for nasal congestion. Nasal saline spray (i.e., Simply Saline) or nasal saline lavage (i.e., NeilMed) is recommended as needed and prior to medicated nasal sprays. Consider allergy injections for long term control if above medications do not help the symptoms - handout given.    Return in about 4 weeks (around 03/16/2021).  Assessment and Plan: Sydney Williams is a 55 y.o. female with: No problem-specific Assessment & Plan notes found for this encounter.  No follow-ups on file.  No orders of the defined types were placed in this encounter.  Lab Orders  No laboratory test(s) ordered today    Diagnostics: Spirometry:  Tracings reviewed. Her effort: {Blank single:19197::"Good reproducible efforts.","It was hard to get consistent efforts and there is a question as to whether this reflects a maximal maneuver.","Poor effort, data can not be interpreted."} FVC: ***L FEV1: ***L, ***% predicted FEV1/FVC ratio: ***% Interpretation: {Blank single:19197::"Spirometry consistent with mild obstructive disease","Spirometry consistent with moderate obstructive disease","Spirometry consistent with severe obstructive disease","Spirometry consistent with possible  restrictive disease","Spirometry consistent with mixed obstructive and restrictive disease","Spirometry uninterpretable due to technique","Spirometry consistent with normal pattern","No  overt abnormalities noted given today's efforts"}.  Please see scanned spirometry results for details.  Skin Testing: {Blank single:19197::"Select foods","Environmental allergy panel","Environmental allergy panel and select foods","Food allergy panel","None","Deferred due to recent antihistamines use"}. Positive test to: ***. Negative test to: ***.  Results discussed with patient/family.   Medication List:  Current Outpatient Medications  Medication Sig Dispense Refill  . acetaminophen (TYLENOL) 500 MG tablet Take 1,000 mg by mouth every 6 (six) hours as needed for moderate pain.    Marland Kitchen acetaminophen-codeine (TYLENOL #3) 300-30 MG tablet Take 1 tablet by mouth in the morning and at bedtime. (Patient not taking: No sig reported)    . albuterol (VENTOLIN HFA) 108 (90 Base) MCG/ACT inhaler INHALE 1-2 PUFFS INTO THE LUNGS EVERY 6 (SIX) HOURS AS NEEDED FOR WHEEZING OR SHORTNESS OF BREATH. 18 g 0  . allopurinol (ZYLOPRIM) 100 MG tablet TAKE 1 TABLET (100 MG TOTAL) BY MOUTH DAILY. (Patient not taking: No sig reported) 30 tablet 0  . amLODipine (NORVASC) 10 MG tablet TAKE 1 TABLET (10 MG TOTAL) BY MOUTH DAILY. TO LOWER BLOOD PRESSURE 30 tablet 0  . ascorbic acid (VITAMIN C) 500 MG tablet Take 500 mg by mouth daily.    Marland Kitchen aspirin (EQ ASPIRIN ADULT LOW DOSE) 81 MG EC tablet Take 1 tablet (81 mg total) by mouth daily. Swallow whole. 90 tablet 3  . atorvastatin (LIPITOR) 40 MG tablet TAKE 1 TABLET (40 MG TOTAL) BY MOUTH DAILY. 30 tablet 0  . baclofen (LIORESAL) 10 MG tablet TAKE 1-2 TABLETS (10-20 MG TOTAL) BY MOUTH 3 (THREE) TIMES DAILY AS NEEDED FOR MUSCLE SPASMS. 90 tablet 1  . cetirizine (ZYRTEC ALLERGY) 10 MG tablet Take 1 tablet (10 mg total) by mouth daily. (Patient not taking: No sig reported) 10 tablet 0  .  diphenhydrAMINE (BENADRYL) 25 MG tablet Take 1 tablet (25 mg total) by mouth every 6 (six) hours as needed. (Patient not taking: No sig reported) 30 tablet 0  . fluticasone (FLONASE) 50 MCG/ACT nasal spray Place 1 spray into both nostrils 2 (two) times daily as needed for rhinitis. 16 g 5  . Fluticasone-Umeclidin-Vilant (TRELEGY ELLIPTA) 200-62.5-25 MCG/INH AEPB Inhale 1 puff into the lungs daily. Rinse mouth after each use. 60 each 5  . furosemide (LASIX) 20 MG tablet TAKE 1 TABLET (20 MG TOTAL) BY MOUTH DAILY AS NEEDED FOR EDEMA. (Patient not taking: No sig reported) 30 tablet 1  . gabapentin (NEURONTIN) 100 MG capsule TAKE 2 IN MORNING, 2 IN LATE AFTERNOON AND 1-2 AT BEDTIME 180 capsule 3  . hydrochlorothiazide (HYDRODIURIL) 25 MG tablet TAKE 1 TABLET (25 MG TOTAL) BY MOUTH DAILY. TO LOWER BLOOD PRESSURE (Patient not taking: Reported on 02/16/2021) 90 tablet 3  . ibuprofen (ADVIL) 600 MG tablet TAKE 1 TABLET (600 MG TOTAL) BY MOUTH EVERY 8 (EIGHT) HOURS AS NEEDED FOR MODERATE PAIN. TAKE AFTER EATING 90 tablet 1  . metFORMIN (GLUCOPHAGE) 500 MG tablet TAKE 1 TABLET (500 MG TOTAL) BY MOUTH 2 (TWO) TIMES DAILY WITH A MEAL. (Patient not taking: No sig reported) 90 tablet 1  . metoprolol tartrate (LOPRESSOR) 50 MG tablet Take 1 tablet (50 mg total) by mouth 2 (two) times daily. 60 tablet 2  . montelukast (SINGULAIR) 10 MG tablet Take 1 tablet (10 mg total) by mouth at bedtime. 30 tablet 5  . Olopatadine HCl 0.2 % SOLN Apply 1 drop to eye daily as needed (itchy/watery eyes). 2.5 mL 5  . omeprazole (PRILOSEC) 40 MG capsule TAKE 1 CAPSULE (40 MG TOTAL)  BY MOUTH 2 (TWO) TIMES DAILY. TO DECREASE STOMACH ACID (Patient not taking: No sig reported) 60 capsule 1  . Vitamin D, Cholecalciferol, 25 MCG (1000 UT) TABS Take 1 tablet by mouth once daily 90 tablet 0   No current facility-administered medications for this visit.   Allergies: Allergies  Allergen Reactions  . Lisinopril Swelling  . Potassium Chloride  Shortness Of Breath and Swelling    Tolerates IV KCl, reaction only to PO product  . Pneumococcal Vaccines Swelling and Other (See Comments)    Reaction:  Swelling at injection site  . Vicodin [Hydrocodone-Acetaminophen] Itching and Rash   I reviewed her past medical history, social history, family history, and environmental history and no significant changes have been reported from her previous visit.  Review of Systems  Constitutional:  Negative for appetite change, chills, fever and unexpected weight change.  HENT:  Positive for congestion and rhinorrhea.   Eyes:  Negative for itching.  Respiratory:  Positive for cough. Negative for chest tightness, shortness of breath and wheezing.   Cardiovascular:  Negative for chest pain.  Gastrointestinal:  Negative for abdominal pain.  Genitourinary:  Negative for difficulty urinating.  Skin:  Negative for rash.  Allergic/Immunologic: Positive for environmental allergies. Negative for food allergies.  Neurological:  Negative for headaches.  Objective: LMP 06/29/2015 (Exact Date)  There is no height or weight on file to calculate BMI. Physical Exam Vitals and nursing note reviewed.  Constitutional:      Appearance: Normal appearance. She is well-developed.  HENT:     Head: Normocephalic and atraumatic.     Right Ear: Tympanic membrane and external ear normal.     Left Ear: Tympanic membrane and external ear normal.     Nose: Congestion (on right side) present.     Mouth/Throat:     Mouth: Mucous membranes are moist.     Pharynx: Oropharynx is clear.  Eyes:     Conjunctiva/sclera: Conjunctivae normal.  Cardiovascular:     Rate and Rhythm: Normal rate and regular rhythm.     Heart sounds: Normal heart sounds. No murmur heard.   No friction rub. No gallop.  Pulmonary:     Effort: Pulmonary effort is normal.     Breath sounds: Normal breath sounds. No wheezing, rhonchi or rales.  Musculoskeletal:     Cervical back: Neck supple.   Skin:    General: Skin is warm.     Findings: No rash.  Neurological:     Mental Status: She is alert and oriented to person, place, and time.  Psychiatric:        Behavior: Behavior normal.  Previous notes and tests were reviewed. The plan was reviewed with the patient/family, and all questions/concerned were addressed.  It was my pleasure to see Sydney Williams today and participate in her care. Please feel free to contact me with any questions or concerns.  Sincerely,  Rexene Alberts, DO Allergy & Immunology  [facility] office: [place] office: [phone]

## 2021-04-10 ENCOUNTER — Other Ambulatory Visit: Payer: Self-pay

## 2021-04-13 ENCOUNTER — Ambulatory Visit: Payer: Self-pay | Attending: Critical Care Medicine

## 2021-04-13 ENCOUNTER — Other Ambulatory Visit: Payer: Self-pay

## 2021-04-13 MED FILL — Ibuprofen Tab 600 MG: ORAL | 30 days supply | Qty: 90 | Fill #0 | Status: AC

## 2021-04-25 ENCOUNTER — Other Ambulatory Visit: Payer: Self-pay

## 2021-04-25 ENCOUNTER — Telehealth: Payer: Self-pay | Admitting: Critical Care Medicine

## 2021-04-25 NOTE — Telephone Encounter (Signed)
Pt was sent a letter from financial dept. Inform them, that the application they submitted was incomplete, since they were missing some documentation at the time of the appointment, Pt need to reschedule and resubmit all new papers and application for [employer] and [employer], P.S. old documents has been sent back by mail to the Pt and Pt. need to make a new appt. 

## 2021-05-01 ENCOUNTER — Other Ambulatory Visit: Payer: Self-pay

## 2021-05-01 ENCOUNTER — Ambulatory Visit: Payer: Self-pay | Attending: Critical Care Medicine | Admitting: Critical Care Medicine

## 2021-05-01 ENCOUNTER — Encounter: Payer: Self-pay | Admitting: Critical Care Medicine

## 2021-05-01 VITALS — BP 131/84 | HR 80 | Resp 16 | Wt 275.0 lb

## 2021-05-01 DIAGNOSIS — L309 Dermatitis, unspecified: Secondary | ICD-10-CM

## 2021-05-01 DIAGNOSIS — E790 Hyperuricemia without signs of inflammatory arthritis and tophaceous disease: Secondary | ICD-10-CM

## 2021-05-01 DIAGNOSIS — I10 Essential (primary) hypertension: Secondary | ICD-10-CM

## 2021-05-01 DIAGNOSIS — H9212 Otorrhea, left ear: Secondary | ICD-10-CM | POA: Insufficient documentation

## 2021-05-01 DIAGNOSIS — E119 Type 2 diabetes mellitus without complications: Secondary | ICD-10-CM

## 2021-05-01 MED ORDER — ATORVASTATIN CALCIUM 40 MG PO TABS
ORAL_TABLET | Freq: Every day | ORAL | 2 refills | Status: DC
  Filled 2021-05-01: qty 90, fill #0
  Filled 2021-05-11: qty 30, 30d supply, fill #0
  Filled 2021-06-06: qty 30, 30d supply, fill #1
  Filled 2021-07-06 (×2): qty 30, 30d supply, fill #2
  Filled 2021-08-14 – 2021-08-21 (×2): qty 30, 30d supply, fill #3

## 2021-05-01 MED ORDER — HYDROCHLOROTHIAZIDE 25 MG PO TABS
ORAL_TABLET | ORAL | 3 refills | Status: DC
  Filled 2021-05-01: qty 30, 30d supply, fill #0
  Filled 2021-05-31: qty 30, 30d supply, fill #1
  Filled 2021-07-12: qty 30, 30d supply, fill #2
  Filled 2021-08-13: qty 30, 30d supply, fill #3
  Filled 2021-09-07: qty 30, 30d supply, fill #4

## 2021-05-01 MED ORDER — METOPROLOL TARTRATE 50 MG PO TABS
50.0000 mg | ORAL_TABLET | Freq: Two times a day (BID) | ORAL | 2 refills | Status: DC
  Filled 2021-05-01 – 2021-06-06 (×2): qty 60, 30d supply, fill #0
  Filled 2021-07-30: qty 60, 30d supply, fill #1

## 2021-05-01 MED ORDER — METFORMIN HCL 500 MG PO TABS
ORAL_TABLET | Freq: Two times a day (BID) | ORAL | 3 refills | Status: DC
  Filled 2021-05-01: qty 60, 30d supply, fill #0

## 2021-05-01 MED ORDER — ALLOPURINOL 100 MG PO TABS
ORAL_TABLET | Freq: Every day | ORAL | 2 refills | Status: DC
  Filled 2021-05-01: qty 30, 30d supply, fill #0
  Filled 2021-06-06: qty 30, 30d supply, fill #1
  Filled 2021-07-06: qty 30, 30d supply, fill #2
  Filled 2021-08-04 – 2021-08-13 (×2): qty 30, 30d supply, fill #3
  Filled 2021-09-07: qty 30, 30d supply, fill #4

## 2021-05-01 MED ORDER — CLOTRIMAZOLE 1 % EX CREA
1.0000 "application " | TOPICAL_CREAM | Freq: Two times a day (BID) | CUTANEOUS | 0 refills | Status: DC
  Filled 2021-05-01: qty 30, 15d supply, fill #0

## 2021-05-01 MED ORDER — TRELEGY ELLIPTA 200-62.5-25 MCG/INH IN AEPB
1.0000 | INHALATION_SPRAY | Freq: Every day | RESPIRATORY_TRACT | 5 refills | Status: DC
  Filled 2021-05-01 – 2021-06-25 (×3): qty 60, 30d supply, fill #0
  Filled 2021-07-31: qty 60, 30d supply, fill #1

## 2021-05-01 MED ORDER — AMLODIPINE BESYLATE 10 MG PO TABS
ORAL_TABLET | Freq: Every day | ORAL | 2 refills | Status: DC
  Filled 2021-05-01: qty 90, fill #0
  Filled 2021-05-11: qty 30, 30d supply, fill #0
  Filled 2021-06-06: qty 30, 30d supply, fill #1
  Filled 2021-07-06: qty 30, 30d supply, fill #2
  Filled 2021-08-04 – 2021-08-13 (×2): qty 30, 30d supply, fill #3

## 2021-05-01 MED ORDER — FLUTICASONE PROPIONATE 50 MCG/ACT NA SUSP
1.0000 | Freq: Two times a day (BID) | NASAL | 5 refills | Status: DC | PRN
  Filled 2021-05-01: qty 16, 30d supply, fill #0

## 2021-05-01 NOTE — Progress Notes (Signed)
Established Patient Office Visit  Subjective:  Patient ID: Sydney Williams, female    DOB: December 24, 1965  Age: 55 y.o. MRN: [id number]  CC:  Chief Complaint  Patient presents with   Otalgia    Right     HPI Sydney Williams presents for evaluation of bilateral ear complaints. She states these symptoms began in May of this year. The patient has been experiencing clear left ear drainage every time she eats. She denies any pain in the left ear or difficulty swallowing. The patient also reports a red itchy rash on the external right ear with associated right inner ear pain. The pain radiates along the right side of her neck. These symptoms have been intermittent since May. She was seen by the mobile health unit in May and was prescribed oral prednisone for these conditions which was not helpful. She tried using neosporin on the rash which was not helpful. The patient has also noticed a rash on the distal right forearm and dorsal area of the right hand over the past 4-5 days. She has not used any new soaps or creams on her skin. The patient uses [employer] on her skin to wash with.   The patient has diabetes with an A1c of 7.3% on 03/20/2021. She states her sugars have been improved recently and have been around 110 when she checks at home. She takes metformin 500 mg twice daily.   Past Medical History:  Diagnosis Date   Asthma    2015 hospitaliation for asthma   Diabetes mellitus    Type II   Family history of breast cancer    Fibroids 2010   Genital herpes    Genital herpes 11/30/2014   GERD (gastroesophageal reflux disease)    Hyperlipidemia    Hypertension    Impaired fasting blood sugar    Migraines    Obesity    Right carpal tunnel syndrome    Smoker     Past Surgical History:  Procedure Laterality Date   BACK SURGERY  10/24/2020   CARDIAC CATHETERIZATION     CARPAL TUNNEL RELEASE Right 08/30/2020   Procedure: RIGHT CARPAL TUNNEL RELEASE;  Surgeon: Leandrew Koyanagi, MD;  Location: [facility];  Service: Orthopedics;  Laterality: Right;   COLONOSCOPY  03/13/2016   Dr. Carlean Purl, normal, repeat 2027   CYST EXCISION     left neck/postauricular region, benign   DILATION AND CURETTAGE OF UTERUS     LAPAROSCOPIC ABDOMINAL EXPLORATION     removal of ectopic preg   LEFT HEART CATH AND CORONARY ANGIOGRAPHY N/A 06/23/2017   Procedure: LEFT HEART CATH AND CORONARY ANGIOGRAPHY;  Surgeon: Nelva Bush, MD;  Location: [facility];  Service: Cardiovascular;  Laterality: N/A;   TUBAL LIGATION      Family History  Problem Relation Age of Onset   Diabetes Mother    Hypertension Mother    Aneurysm Mother    Stroke Mother    Cancer Mother        cervical cancer   Cancer Maternal Aunt        breast   Cancer Cousin        breast/breast   Lupus Cousin    Heart disease Neg Hx    Colon cancer Neg Hx    Allergic rhinitis Neg Hx    Angioedema Neg Hx    Asthma Neg Hx    Atopy Neg Hx    Eczema Neg Hx    Immunodeficiency Neg  Hx    Urticaria Neg Hx     Social History   Socioeconomic History   Marital status: Legally Separated    Spouse name: Not on file   Number of children: Not on file   Years of education: Not on file   Highest education level: Not on file  Occupational History   Not on file  Tobacco Use   Smoking status: Every Day    Packs/day: 0.25    Years: 14.00    Pack years: 3.50    Types: Cigarettes   Smokeless tobacco: Never  Vaping Use   Vaping Use: Never used  Substance and Sexual Activity   Alcohol use: Yes    Comment: occ   Drug use: No   Sexual activity: Yes    Birth control/protection: Surgical  Other Topics Concern   Not on file  Social History Narrative   Married, and her granddaughter lives with her, works as Quarry manager at [employer], exercise - activity at work, walking.  12/2016   Social Determinants of Health   Financial Resource Strain: Not on file  Food Insecurity: Not on file  Transportation Needs: Not on file   Physical Activity: Not on file  Stress: Not on file  Social Connections: Not on file  Intimate Partner Violence: Not on file    Outpatient Medications Prior to Visit  Medication Sig Dispense Refill   acetaminophen (TYLENOL) 500 MG tablet Take 1,000 mg by mouth every 6 (six) hours as needed for moderate pain.     albuterol (VENTOLIN HFA) 108 (90 Base) MCG/ACT inhaler INHALE 1-2 PUFFS INTO THE LUNGS EVERY 6 (SIX) HOURS AS NEEDED FOR WHEEZING OR SHORTNESS OF BREATH. 18 g 0   ascorbic acid (VITAMIN C) 500 MG tablet Take 500 mg by mouth daily.     aspirin (EQ ASPIRIN ADULT LOW DOSE) 81 MG EC tablet Take 1 tablet (81 mg total) by mouth daily. Swallow whole. 90 tablet 3   diphenhydrAMINE (BENADRYL) 25 MG tablet Take 1 tablet (25 mg total) by mouth every 6 (six) hours as needed. 30 tablet 0   ferrous sulfate 325 (65 FE) MG tablet Take 325 mg by mouth daily with breakfast.     furosemide (LASIX) 20 MG tablet TAKE 1 TABLET (20 MG TOTAL) BY MOUTH DAILY AS NEEDED FOR EDEMA. 30 tablet 1   ibuprofen (ADVIL) 600 MG tablet TAKE 1 TABLET (600 MG TOTAL) BY MOUTH EVERY 8 (EIGHT) HOURS AS NEEDED FOR MODERATE PAIN. TAKE AFTER EATING 90 tablet 1   Vitamin D, Cholecalciferol, 25 MCG (1000 UT) TABS Take 1 tablet by mouth once daily 90 tablet 0   allopurinol (ZYLOPRIM) 100 MG tablet TAKE 1 TABLET (100 MG TOTAL) BY MOUTH DAILY. 30 tablet 0   amLODipine (NORVASC) 10 MG tablet TAKE 1 TABLET (10 MG TOTAL) BY MOUTH DAILY. TO LOWER BLOOD PRESSURE 30 tablet 0   atorvastatin (LIPITOR) 40 MG tablet TAKE 1 TABLET (40 MG TOTAL) BY MOUTH DAILY. 30 tablet 0   cetirizine (ZYRTEC ALLERGY) 10 MG tablet Take 1 tablet (10 mg total) by mouth daily. 10 tablet 0   fluticasone (FLONASE) 50 MCG/ACT nasal spray Place 1 spray into both nostrils 2 (two) times daily as needed for rhinitis. 16 g 5   Fluticasone-Umeclidin-Vilant (TRELEGY ELLIPTA) 200-62.5-25 MCG/INH AEPB Inhale 1 puff into the lungs daily. Rinse mouth after each use. 60 each 5    hydrochlorothiazide (HYDRODIURIL) 25 MG tablet TAKE 1 TABLET (25 MG TOTAL) BY MOUTH DAILY. TO LOWER BLOOD  PRESSURE 90 tablet 3   metFORMIN (GLUCOPHAGE) 500 MG tablet TAKE 1 TABLET (500 MG TOTAL) BY MOUTH 2 (TWO) TIMES DAILY WITH A MEAL. 90 tablet 1   metoprolol tartrate (LOPRESSOR) 50 MG tablet Take 1 tablet (50 mg total) by mouth 2 (two) times daily. 60 tablet 2   acetaminophen-codeine (TYLENOL #3) 300-30 MG tablet Take 1 tablet by mouth in the morning and at bedtime. (Patient not taking: No sig reported)     baclofen (LIORESAL) 10 MG tablet TAKE 1-2 TABLETS (10-20 MG TOTAL) BY MOUTH 3 (THREE) TIMES DAILY AS NEEDED FOR MUSCLE SPASMS. (Patient not taking: Reported on 05/01/2021) 90 tablet 1   gabapentin (NEURONTIN) 100 MG capsule TAKE 2 IN MORNING, 2 IN LATE AFTERNOON AND 1-2 AT BEDTIME (Patient not taking: Reported on 05/01/2021) 180 capsule 3   montelukast (SINGULAIR) 10 MG tablet Take 1 tablet (10 mg total) by mouth at bedtime. (Patient not taking: Reported on 05/01/2021) 30 tablet 5   Olopatadine HCl 0.2 % SOLN Apply 1 drop to eye daily as needed (itchy/watery eyes). (Patient not taking: Reported on 05/01/2021) 2.5 mL 5   omeprazole (PRILOSEC) 40 MG capsule TAKE 1 CAPSULE (40 MG TOTAL) BY MOUTH 2 (TWO) TIMES DAILY. TO DECREASE STOMACH ACID (Patient not taking: Reported on 05/01/2021) 60 capsule 1   No facility-administered medications prior to visit.    Allergies  Allergen Reactions   Lisinopril Swelling   Potassium Chloride Shortness Of Breath and Swelling    Tolerates IV KCl, reaction only to PO product   Pneumococcal Vaccines Swelling and Other (See Comments)    Reaction:  Swelling at injection site   Vicodin [Hydrocodone-Acetaminophen] Itching and Rash    ROS Review of Systems  Constitutional:  Negative for chills and fever.  HENT:  Positive for ear discharge (left) and ear pain (right). Negative for congestion, hearing loss, rhinorrhea, sore throat and trouble swallowing.   Eyes:   Negative for pain.  Respiratory:  Negative for cough and shortness of breath.   Cardiovascular:  Negative for chest pain.  Gastrointestinal:  Negative for abdominal pain, diarrhea, nausea and vomiting.  Endocrine: Negative.   Musculoskeletal:  Positive for back pain.  Skin:  Positive for rash.  Neurological:  Negative for headaches.     Objective:    Physical Exam Vitals and nursing note reviewed.  Constitutional:      General: She is not in acute distress.    Appearance: She is obese.  HENT:     Head: Normocephalic and atraumatic.     Right Ear: Tympanic membrane and ear canal normal.     Left Ear: Tympanic membrane and ear canal normal.     Ears:     Comments: Right outer ear tenderness and tenderness along the right tragus and mastoid process. Round area of erythema and scaling on the right outer ear.  Cerumen present in the left canal along the inferior aspect of the left TM. Some thickening noted along the inferior aspect of the left TM.     Mouth/Throat:     Mouth: Mucous membranes are moist.     Pharynx: Oropharynx is clear.  Eyes:     Conjunctiva/sclera: Conjunctivae normal.  Cardiovascular:     Rate and Rhythm: Normal rate and regular rhythm.     Heart sounds: Normal heart sounds. No murmur heard.   No friction rub. No gallop.  Pulmonary:     Effort: Pulmonary effort is normal. No respiratory distress.     Breath sounds:  Normal breath sounds. No wheezing.  Abdominal:     General: There is no distension.     Palpations: Abdomen is soft.     Tenderness: There is no abdominal tenderness.  Musculoskeletal:        General: Normal range of motion.     Cervical back: Normal range of motion. No tenderness.  Skin:    Comments: Small area of erythematous papules along the dorsum of the right hand and right wrist  Neurological:     Mental Status: She is alert and oriented to person, place, and time.  Psychiatric:        Mood and Affect: Mood normal.        Behavior:  Behavior normal.    BP 131/84   Pulse 80   Resp 16   Wt 275 lb (124.7 kg)   LMP 06/29/2015 (Exact Date)   SpO2 95%   BMI 38.35 kg/m  Wt Readings from Last 3 Encounters:  05/01/21 275 lb (124.7 kg)  03/20/21 277 lb (125.6 kg)  03/16/21 276 lb (125.2 kg)     Health Maintenance Due  Topic Date Due   Pneumococcal Vaccine 5-16 Years old (1 - PCV) Never done   Zoster Vaccines- Shingrix (1 of 2) Never done   OPHTHALMOLOGY EXAM  02/03/2019   MAMMOGRAM  02/12/2020   COVID-19 Vaccine (4 - Booster for Pfizer series) 02/19/2021   URINE MICROALBUMIN  06/06/2021    There are no preventive care reminders to display for this patient.  Lab Results  Component Value Date   TSH 1.21 09/11/2016   Lab Results  Component Value Date   WBC 8.6 01/30/2021   HGB 14.0 01/30/2021   HCT 43.7 01/30/2021   MCV 91.8 01/30/2021   PLT 289 01/30/2021   Lab Results  Component Value Date   NA 143 01/30/2021   K 3.1 (L) 01/30/2021   CO2 30 01/30/2021   GLUCOSE 98 01/30/2021   BUN 12 01/30/2021   CREATININE 0.70 01/30/2021   BILITOT 0.6 10/18/2020   ALKPHOS 97 10/18/2020   AST 28 10/18/2020   ALT 27 10/18/2020   PROT 7.7 10/18/2020   ALBUMIN 3.7 10/18/2020   CALCIUM 9.3 01/30/2021   ANIONGAP 12 01/30/2021   Lab Results  Component Value Date   CHOL 147 06/06/2020   Lab Results  Component Value Date   HDL 35 (L) 06/06/2020   Lab Results  Component Value Date   LDLCALC 68 06/06/2020   Lab Results  Component Value Date   TRIG 273 (H) 06/06/2020   Lab Results  Component Value Date   CHOLHDL 4.2 06/06/2020   Lab Results  Component Value Date   HGBA1C 7.3 (A) 03/20/2021      Assessment & Plan:   Problem List Items Addressed This Visit       Cardiovascular and Mediastinum   Essential hypertension    Well controlled  Continue current treatment plan       Relevant Medications   amLODipine (NORVASC) 10 MG tablet   atorvastatin (LIPITOR) 40 MG tablet    hydrochlorothiazide (HYDRODIURIL) 25 MG tablet   metoprolol tartrate (LOPRESSOR) 50 MG tablet     Endocrine   Type 2 diabetes mellitus without complication, without long-term current use of insulin ([place]) - Primary    Patient with A1c of 7.3% on 03/20/2021 with improved sugar levels at home recently of about 110. Continue metformin 500 mg twice daily. Follow up at next visit.  Relevant Medications   atorvastatin (LIPITOR) 40 MG tablet   metFORMIN (GLUCOPHAGE) 500 MG tablet   Other Relevant Orders   Microalbumin / creatinine urine ratio     Nervous and Auditory   Dermatitis of external ear    Dermatitis of the right outer ear for two months likely secondary to yeast infection. Use lotrimin 1% cream on the area twice daily. A referral to Dr. Erik Obey to evaluate the rash of the right ear will be made. Follow up at next visit.        Drainage from left ear    Patient with a two month history of clear left ear drainage which has not improved.  A referral to Dr. Erik Obey will be made to evaluate the left ear drainage. Follow up at next visit.          Other   Elevated blood uric acid level    Continue allopurinol        Meds ordered this encounter  Medications   clotrimazole (LOTRIMIN) 1 % cream    Sig: Apply 1 application topically 2 (two) times daily. To the external right ear    Dispense:  30 g    Refill:  0   allopurinol (ZYLOPRIM) 100 MG tablet    Sig: TAKE 1 TABLET (100 MG TOTAL) BY MOUTH DAILY.    Dispense:  90 tablet    Refill:  2   amLODipine (NORVASC) 10 MG tablet    Sig: TAKE 1 TABLET (10 MG TOTAL) BY MOUTH DAILY. TO LOWER BLOOD PRESSURE    Dispense:  90 tablet    Refill:  2   atorvastatin (LIPITOR) 40 MG tablet    Sig: TAKE 1 TABLET (40 MG TOTAL) BY MOUTH DAILY.    Dispense:  90 tablet    Refill:  2   fluticasone (FLONASE) 50 MCG/ACT nasal spray    Sig: Place 1 spray into both nostrils 2 (two) times daily as needed for rhinitis.    Dispense:  16  g    Refill:  5   Fluticasone-Umeclidin-Vilant (TRELEGY ELLIPTA) 200-62.5-25 MCG/INH AEPB    Sig: Inhale 1 puff into the lungs daily. Rinse mouth after each use.    Dispense:  60 each    Refill:  5   hydrochlorothiazide (HYDRODIURIL) 25 MG tablet    Sig: TAKE 1 TABLET (25 MG TOTAL) BY MOUTH DAILY. TO LOWER BLOOD PRESSURE    Dispense:  90 tablet    Refill:  3   metFORMIN (GLUCOPHAGE) 500 MG tablet    Sig: TAKE 1 TABLET (500 MG TOTAL) BY MOUTH 2 (TWO) TIMES DAILY WITH A MEAL.    Dispense:  90 tablet    Refill:  3   metoprolol tartrate (LOPRESSOR) 50 MG tablet    Sig: Take 1 tablet (50 mg total) by mouth 2 (two) times daily.    Dispense:  60 tablet    Refill:  2    Follow-up: Return in about 4 months (around 09/01/2021).    Asencion Noble, MD

## 2021-05-01 NOTE — Assessment & Plan Note (Addendum)
Dermatitis of the right outer ear for two months likely secondary to yeast infection. Use lotrimin 1% cream on the area twice daily. A referral to Dr. Erik Obey to evaluate the rash of the right ear will be made. Follow up at next visit.

## 2021-05-01 NOTE — Assessment & Plan Note (Signed)
Continue allopurinol 

## 2021-05-01 NOTE — Patient Instructions (Signed)
A referral to Dr. Erik Obey to evaluate your left ear drainage and rash on the right ear will be made for this Friday, July 8 here at this clinic  Refills on all your medications sent to your pharmacy  Lotrimin cream sent for the right external ear apply to the area of irritation twice daily  Urine for microalbumin is obtained today  Please call the breast cancer control program to get your mammogram scheduled using the scholarship fund so that it will be free to you see the brochure that we gave you the order for the mammogram is already in the system since April of this year  Return to see Dr. Joya Gaskins 4 months

## 2021-05-01 NOTE — Assessment & Plan Note (Signed)
Well controlled  Continue current treatment plan

## 2021-05-01 NOTE — Assessment & Plan Note (Signed)
Patient with a two month history of clear left ear drainage which has not improved.  A referral to Dr. Erik Obey will be made to evaluate the left ear drainage. Follow up at next visit.

## 2021-05-01 NOTE — Assessment & Plan Note (Signed)
Patient with A1c of 7.3% on 03/20/2021 with improved sugar levels at home recently of about 110. Continue metformin 500 mg twice daily. Follow up at next visit.

## 2021-05-02 LAB — MICROALBUMIN / CREATININE URINE RATIO
Creatinine, Urine: 245 mg/dL
Microalb/Creat Ratio: 14 mg/g creat (ref 0–29)
Microalbumin, Urine: 35.1 ug/mL

## 2021-05-07 ENCOUNTER — Ambulatory Visit: Payer: Medicaid Other

## 2021-05-07 ENCOUNTER — Other Ambulatory Visit: Payer: Self-pay

## 2021-05-08 ENCOUNTER — Other Ambulatory Visit: Payer: Self-pay

## 2021-05-11 ENCOUNTER — Other Ambulatory Visit: Payer: Self-pay

## 2021-05-11 ENCOUNTER — Ambulatory Visit: Payer: Self-pay | Attending: Critical Care Medicine

## 2021-05-15 ENCOUNTER — Other Ambulatory Visit: Payer: Self-pay

## 2021-05-15 MED FILL — Furosemide Tab 20 MG: ORAL | 30 days supply | Qty: 30 | Fill #0 | Status: AC

## 2021-05-16 ENCOUNTER — Other Ambulatory Visit: Payer: Self-pay

## 2021-05-18 ENCOUNTER — Other Ambulatory Visit: Payer: Self-pay | Admitting: Critical Care Medicine

## 2021-05-18 MED ORDER — IBUPROFEN 600 MG PO TABS
ORAL_TABLET | ORAL | 1 refills | Status: DC
  Filled 2021-05-18: qty 90, 30d supply, fill #0
  Filled 2021-07-16: qty 90, 30d supply, fill #1

## 2021-05-18 NOTE — Telephone Encounter (Signed)
Requested medication (s) are due for refill today:  Yes  Requested medication (s) are on the active medication list:   Yes  Future visit scheduled:   No seen 2 wks ago   Last ordered: 10/16/2020 #90, 1 refill  Returned because no more refills remain.   Needs new Rx   Requested Prescriptions  Pending Prescriptions Disp Refills   ibuprofen (ADVIL) 600 MG tablet 90 tablet 1    Sig: TAKE 1 TABLET (600 MG TOTAL) BY MOUTH EVERY 8 (EIGHT) HOURS AS NEEDED FOR MODERATE PAIN. TAKE AFTER EATING      Analgesics:  NSAIDS Passed - 05/18/2021  1:17 PM      Passed - Cr in normal range and within 360 days    Creat  Date Value Ref Range Status  08/04/2020 0.52 0.50 - 1.05 mg/dL Final    Comment:    For patients >16 years of age, the reference limit for Creatinine is approximately 13% higher for people identified as African-American. .    Creatinine, Ser  Date Value Ref Range Status  01/30/2021 0.70 0.44 - 1.00 mg/dL Final   Creatinine, Urine  Date Value Ref Range Status  01/08/2017 122 20 - 320 mg/dL Final          Passed - HGB in normal range and within 360 days    Hemoglobin  Date Value Ref Range Status  01/30/2021 14.0 12.0 - 15.0 g/dL Final  06/06/2020 13.7 11.1 - 15.9 g/dL Final          Passed - Patient is not pregnant      Passed - Valid encounter within last 12 months    Recent Outpatient Visits           2 weeks ago Type 2 diabetes mellitus without complication, without long-term current use of insulin ([place])   [place] Elsie Stain, MD   3 months ago Pap smear for cervical cancer screening   [place] Karle Plumber B, MD   5 months ago Type 2 diabetes mellitus without complication, without long-term current use of insulin [facility])   [place] Elsie Stain, MD   8 months ago Need for influenza vaccination   [place],  Annie Main L, RPH-CPP   8 months ago Type 2 diabetes mellitus without complication, without long-term current use of insulin [facility])   [place] [facility] Elsie Stain, MD

## 2021-05-21 ENCOUNTER — Other Ambulatory Visit: Payer: Self-pay

## 2021-05-24 ENCOUNTER — Other Ambulatory Visit: Payer: Self-pay

## 2021-06-01 ENCOUNTER — Other Ambulatory Visit: Payer: Self-pay

## 2021-06-06 ENCOUNTER — Telehealth: Payer: Self-pay | Admitting: Critical Care Medicine

## 2021-06-06 ENCOUNTER — Other Ambulatory Visit: Payer: Self-pay

## 2021-06-06 MED FILL — Acyclovir Cap 200 MG: ORAL | 30 days supply | Qty: 60 | Fill #1 | Status: AC

## 2021-06-06 NOTE — Telephone Encounter (Signed)
Copied from [place] [id number]. Topic: General - Other >> May 30, 2021  2:14 PM Tessa Lerner A wrote: Reason for CRM: Patient would like to be contacted regarding their orange card application status  Please contact further when possible   Was this patient only approved for the [place]?

## 2021-06-06 NOTE — Telephone Encounter (Signed)
I return Pt call, she was inform that she was qualified for the [facility] program with a 75% discount and she not NOT qualified for the [facility] program

## 2021-06-12 ENCOUNTER — Other Ambulatory Visit: Payer: Self-pay

## 2021-06-13 ENCOUNTER — Other Ambulatory Visit: Payer: Self-pay

## 2021-06-20 ENCOUNTER — Other Ambulatory Visit: Payer: Self-pay

## 2021-06-25 ENCOUNTER — Other Ambulatory Visit: Payer: Self-pay

## 2021-06-25 ENCOUNTER — Other Ambulatory Visit: Payer: Self-pay | Admitting: Critical Care Medicine

## 2021-06-25 MED ORDER — FUROSEMIDE 20 MG PO TABS
ORAL_TABLET | ORAL | 1 refills | Status: DC
  Filled 2021-06-25: qty 30, 30d supply, fill #0
  Filled 2021-08-14 – 2021-08-21 (×2): qty 30, 30d supply, fill #1

## 2021-06-26 ENCOUNTER — Other Ambulatory Visit: Payer: Self-pay

## 2021-06-27 ENCOUNTER — Other Ambulatory Visit: Payer: Self-pay

## 2021-07-03 ENCOUNTER — Ambulatory Visit: Payer: Self-pay | Admitting: *Deleted

## 2021-07-03 NOTE — Telephone Encounter (Signed)
Reason for Disposition  Ear pain  Answer Assessment - Initial Assessment Questions 1. LOCATION: "Which ear is involved?"      Right ear  2. COLOR: "What is the color of the discharge?"      Brown  3. CONSISTENCY: "How runny is the discharge? Could it be water?"      Na  4. ONSET: "When did you first notice the discharge?"     Na  5. PAIN: "Is there any earache?" "How bad is it?"  (Scale 1-10; or mild, moderate, severe)     Yes  getting worse and now right side of neck and throat is painful  6. OBJECTS: "Have you put anything in your ear?" (e.g., Q-tip, other object)      No  7. OTHER SYMPTOMS: "Do you have any other symptoms?" (e.g., headache, fever, dizziness, vomiting, runny nose)     Itching  8. PREGNANCY: "Is there any chance you are pregnant?" "When was your last menstrual period?"     na  Protocols used: Ear - [place]

## 2021-07-03 NOTE — Telephone Encounter (Signed)
Patient experiencing ear, neck pain and eye discomfort. Patient states PCP prescribed a cream 2 months ago (patient does not know the name of cream) Scheduled patient an appointment with PCP on 08/28/2021 (next available) Patient seeking clinical advice. Patient states PCP was going to place a ENT referral (chart does not reflect, sent a CRM to the practice). Placed patient on wait list.   Called patient to review symptoms. Patient reports she is now having increased pain to right side of ear, neck and throat. Throat "feels swollen". Drainage noted from right ear and color is "brown".  Continues to have itching right ear and using clotimazole 1% cream as ordered. PCP note  from 05/01/21, reports referral will be made to Dr. Erik Obey to evaluate rash to right ear. Patient reports she has not been contacted from ENT. Please send referral again. Denies fever, difficulty swallowing or breathing. Appt 08/28/21 with PCP . Please advise . Care advise given. Patient verbalized understanding and would like to hear back from PCP. Patient verbalized understanding to call back if symptoms worsen.

## 2021-07-03 NOTE — Telephone Encounter (Signed)
The pt was added to my schedule tomorrow  I will see her then and decide

## 2021-07-03 NOTE — Telephone Encounter (Signed)
Pt is needing referral to ENT

## 2021-07-03 NOTE — Telephone Encounter (Signed)
Pt needing referral to ENT

## 2021-07-04 ENCOUNTER — Other Ambulatory Visit: Payer: Self-pay

## 2021-07-04 ENCOUNTER — Ambulatory Visit: Payer: Self-pay | Attending: Critical Care Medicine | Admitting: Critical Care Medicine

## 2021-07-04 ENCOUNTER — Encounter: Payer: Self-pay | Admitting: Critical Care Medicine

## 2021-07-04 VITALS — BP 119/86 | HR 84 | Resp 16 | Wt 265.4 lb

## 2021-07-04 DIAGNOSIS — H6090 Unspecified otitis externa, unspecified ear: Secondary | ICD-10-CM | POA: Insufficient documentation

## 2021-07-04 DIAGNOSIS — Z23 Encounter for immunization: Secondary | ICD-10-CM

## 2021-07-04 DIAGNOSIS — I10 Essential (primary) hypertension: Secondary | ICD-10-CM

## 2021-07-04 DIAGNOSIS — E119 Type 2 diabetes mellitus without complications: Secondary | ICD-10-CM

## 2021-07-04 DIAGNOSIS — H6241 Otitis externa in other diseases classified elsewhere, right ear: Secondary | ICD-10-CM

## 2021-07-04 DIAGNOSIS — H60391 Other infective otitis externa, right ear: Secondary | ICD-10-CM

## 2021-07-04 DIAGNOSIS — J4540 Moderate persistent asthma, uncomplicated: Secondary | ICD-10-CM

## 2021-07-04 DIAGNOSIS — B369 Superficial mycosis, unspecified: Secondary | ICD-10-CM | POA: Insufficient documentation

## 2021-07-04 LAB — POCT GLYCOSYLATED HEMOGLOBIN (HGB A1C): HbA1c, POC (controlled diabetic range): 7 % (ref 0.0–7.0)

## 2021-07-04 MED ORDER — CLOTRIMAZOLE 1 % EX SOLN
1.0000 "application " | Freq: Two times a day (BID) | CUTANEOUS | 0 refills | Status: DC
  Filled 2021-07-04: qty 10, 5d supply, fill #0

## 2021-07-04 MED ORDER — CIPRO HC 0.2-1 % OT SUSP
3.0000 [drp] | Freq: Two times a day (BID) | OTIC | 0 refills | Status: DC
  Filled 2021-07-04: qty 10, 26d supply, fill #0
  Filled 2021-07-12: qty 10, 33d supply, fill #0

## 2021-07-04 NOTE — Progress Notes (Signed)
Established Patient Office Visit  Subjective:  Patient ID: Sydney Williams, female    DOB: 09-07-1966  Age: 55 y.o. MRN: [id number]  CC:  Chief Complaint  Patient presents with   Ear Pain    HPI Sydney Williams presents for increased itching and pain in the right ear.  She failed external antifungal cream.  Note on arrival blood pressure 119/86.  She has been more compliant with her diabetes.  She has lost some weight.  She has no other complaints. On arrival A1c down to 7.0  Past Medical History:  Diagnosis Date   Asthma    2015 hospitaliation for asthma   Diabetes mellitus    Type II   Family history of breast cancer    Fibroids 2010   Genital herpes    Genital herpes 11/30/2014   GERD (gastroesophageal reflux disease)    Hyperlipidemia    Hypertension    Impaired fasting blood sugar    Migraines    Obesity    Right carpal tunnel syndrome    Smoker     Past Surgical History:  Procedure Laterality Date   BACK SURGERY  10/24/2020   CARDIAC CATHETERIZATION     CARPAL TUNNEL RELEASE Right 08/30/2020   Procedure: RIGHT CARPAL TUNNEL RELEASE;  Surgeon: Leandrew Koyanagi, MD;  Location: [place];  Service: Orthopedics;  Laterality: Right;   COLONOSCOPY  03/13/2016   Dr. Carlean Purl, normal, repeat 2027   CYST EXCISION     left neck/postauricular region, benign   DILATION AND CURETTAGE OF UTERUS     LAPAROSCOPIC ABDOMINAL EXPLORATION     removal of ectopic preg   LEFT HEART CATH AND CORONARY ANGIOGRAPHY N/A 06/23/2017   Procedure: LEFT HEART CATH AND CORONARY ANGIOGRAPHY;  Surgeon: Nelva Bush, MD;  Location: [facility];  Service: Cardiovascular;  Laterality: N/A;   TUBAL LIGATION      Family History  Problem Relation Age of Onset   Diabetes Mother    Hypertension Mother    Aneurysm Mother    Stroke Mother    Cancer Mother        cervical cancer   Cancer Maternal Aunt        breast   Cancer Cousin        breast/breast   Lupus Cousin    Heart  disease Neg Hx    Colon cancer Neg Hx    Allergic rhinitis Neg Hx    Angioedema Neg Hx    Asthma Neg Hx    Atopy Neg Hx    Eczema Neg Hx    Immunodeficiency Neg Hx    Urticaria Neg Hx     Social History   Socioeconomic History   Marital status: Legally Separated    Spouse name: Not on file   Number of children: Not on file   Years of education: Not on file   Highest education level: Not on file  Occupational History   Not on file  Tobacco Use   Smoking status: Every Day    Packs/day: 0.25    Years: 14.00    Pack years: 3.50    Types: Cigarettes   Smokeless tobacco: Never  Vaping Use   Vaping Use: Never used  Substance and Sexual Activity   Alcohol use: Yes    Comment: occ   Drug use: No   Sexual activity: Yes    Birth control/protection: Surgical  Other Topics Concern   Not on file  Social History  Narrative   Married, and her granddaughter lives with her, works as Quarry manager at [employer], exercise - activity at work, walking.  12/2016   Social Determinants of Health   Financial Resource Strain: Not on file  Food Insecurity: Not on file  Transportation Needs: Not on file  Physical Activity: Not on file  Stress: Not on file  Social Connections: Not on file  Intimate Partner Violence: Not on file    Outpatient Medications Prior to Visit  Medication Sig Dispense Refill   acetaminophen (TYLENOL) 500 MG tablet Take 1,000 mg by mouth every 6 (six) hours as needed for moderate pain.     acyclovir (ZOVIRAX) 200 MG capsule TAKE 1 CAPSULE (200 MG TOTAL) BY MOUTH 2 (TWO) TIMES DAILY. 180 capsule 3   albuterol (VENTOLIN HFA) 108 (90 Base) MCG/ACT inhaler INHALE 1-2 PUFFS INTO THE LUNGS EVERY 6 (SIX) HOURS AS NEEDED FOR WHEEZING OR SHORTNESS OF BREATH. 18 g 0   allopurinol (ZYLOPRIM) 100 MG tablet TAKE 1 TABLET (100 MG TOTAL) BY MOUTH DAILY. 90 tablet 2   amLODipine (NORVASC) 10 MG tablet TAKE 1 TABLET (10 MG TOTAL) BY MOUTH DAILY. TO LOWER BLOOD PRESSURE 90 tablet 2    ascorbic acid (VITAMIN C) 500 MG tablet Take 500 mg by mouth daily.     aspirin (EQ ASPIRIN ADULT LOW DOSE) 81 MG EC tablet Take 1 tablet (81 mg total) by mouth daily. Swallow whole. 90 tablet 3   atorvastatin (LIPITOR) 40 MG tablet TAKE 1 TABLET (40 MG TOTAL) BY MOUTH DAILY. 90 tablet 2   diphenhydrAMINE (BENADRYL) 25 MG tablet Take 1 tablet (25 mg total) by mouth every 6 (six) hours as needed. 30 tablet 0   ferrous sulfate 325 (65 FE) MG tablet Take 325 mg by mouth daily with breakfast.     fluticasone (FLONASE) 50 MCG/ACT nasal spray Place 1 spray into both nostrils 2 (two) times daily as needed for rhinitis. 16 g 5   Fluticasone-Umeclidin-Vilant (TRELEGY ELLIPTA) 200-62.5-25 MCG/INH AEPB Inhale 1 puff into the lungs daily. Rinse mouth after each use. 60 each 5   furosemide (LASIX) 20 MG tablet TAKE 1 TABLET (20 MG TOTAL) BY MOUTH DAILY AS NEEDED FOR EDEMA. 30 tablet 1   hydrochlorothiazide (HYDRODIURIL) 25 MG tablet TAKE 1 TABLET (25 MG TOTAL) BY MOUTH DAILY. TO LOWER BLOOD PRESSURE 90 tablet 3   ibuprofen (ADVIL) 600 MG tablet TAKE 1 TABLET (600 MG TOTAL) BY MOUTH EVERY 8 (EIGHT) HOURS AS NEEDED FOR MODERATE PAIN. TAKE AFTER EATING 90 tablet 1   metFORMIN (GLUCOPHAGE) 500 MG tablet TAKE 1 TABLET (500 MG TOTAL) BY MOUTH 2 (TWO) TIMES DAILY WITH A MEAL. 90 tablet 3   metoprolol tartrate (LOPRESSOR) 50 MG tablet Take 1 tablet (50 mg total) by mouth 2 (two) times daily. 60 tablet 2   Vitamin D, Cholecalciferol, 25 MCG (1000 UT) TABS Take 1 tablet by mouth once daily 90 tablet 0   clotrimazole (LOTRIMIN) 1 % cream Apply 1 application topically 2 (two) times daily. To the external right ear 30 g 0   No facility-administered medications prior to visit.    Allergies  Allergen Reactions   Lisinopril Swelling   Potassium Chloride Shortness Of Breath and Swelling    Tolerates IV KCl, reaction only to PO product   Pneumococcal Vaccines Swelling and Other (See Comments)    Reaction:  Swelling at  injection site   Vicodin [Hydrocodone-Acetaminophen] Itching and Rash    ROS Review of Systems  Constitutional:  Negative for chills, diaphoresis and fever.  HENT:  Positive for ear discharge and ear pain. Negative for congestion, hearing loss, nosebleeds, sore throat and tinnitus.   Eyes:  Negative for photophobia and discharge.  Respiratory:  Negative for cough, shortness of breath, wheezing and stridor.        No excess mucus  Cardiovascular:  Negative for chest pain, palpitations and leg swelling.  Gastrointestinal:  Negative for abdominal pain, blood in stool, constipation, diarrhea, nausea and vomiting.  Endocrine: Negative for polydipsia.  Genitourinary:  Negative for dysuria, flank pain, frequency, hematuria and urgency.  Musculoskeletal:  Negative for back pain, myalgias and neck pain.  Skin:  Negative for rash.  Allergic/Immunologic: Negative for environmental allergies.  Neurological:  Negative for dizziness, tremors, seizures, weakness and headaches.  Hematological:  Does not bruise/bleed easily.  Psychiatric/Behavioral:  Negative for hallucinations and suicidal ideas. The patient is not nervous/anxious.   All other systems reviewed and are negative.    Objective:    Physical Exam Vitals reviewed.  Constitutional:      Appearance: Normal appearance. She is well-developed. She is obese. She is not diaphoretic.  HENT:     Head: Normocephalic and atraumatic.     Right Ear: Tympanic membrane normal.     Left Ear: Tympanic membrane, ear canal and external ear normal.     Ears:     Comments: Evidence of mycosis and bacterial inflammation in the right external auditory canal tympanic membrane is intact    Nose: No nasal deformity, septal deviation, mucosal edema or rhinorrhea.     Right Sinus: No maxillary sinus tenderness or frontal sinus tenderness.     Left Sinus: No maxillary sinus tenderness or frontal sinus tenderness.     Mouth/Throat:     Pharynx: No oropharyngeal  exudate.  Eyes:     General: No scleral icterus.    Conjunctiva/sclera: Conjunctivae normal.     Pupils: Pupils are equal, round, and reactive to light.  Neck:     Thyroid: No thyromegaly.     Vascular: No carotid bruit or JVD.     Trachea: Trachea normal. No tracheal tenderness or tracheal deviation.  Cardiovascular:     Rate and Rhythm: Normal rate and regular rhythm.     Chest Wall: PMI is not displaced.     Pulses: Normal pulses. No decreased pulses.     Heart sounds: Normal heart sounds, S1 normal and S2 normal. Heart sounds not distant. No murmur heard. No systolic murmur is present.  No diastolic murmur is present.    No friction rub. No gallop. No S3 or S4 sounds.  Pulmonary:     Effort: No tachypnea, accessory muscle usage or respiratory distress.     Breath sounds: No stridor. No decreased breath sounds, wheezing, rhonchi or rales.  Chest:     Chest wall: No tenderness.  Abdominal:     General: Bowel sounds are normal. There is no distension.     Palpations: Abdomen is soft. Abdomen is not rigid.     Tenderness: There is no abdominal tenderness. There is no guarding or rebound.  Musculoskeletal:        General: Normal range of motion.     Cervical back: Normal range of motion and neck supple. No edema, erythema or rigidity. No muscular tenderness. Normal range of motion.  Lymphadenopathy:     Head:     Right side of head: No submental or submandibular adenopathy.     Left side of head:  No submental or submandibular adenopathy.     Cervical: No cervical adenopathy.  Skin:    General: Skin is warm and dry.     Coloration: Skin is not pale.     Findings: No rash.     Nails: There is no clubbing.  Neurological:     Mental Status: She is alert and oriented to person, place, and time.     Sensory: No sensory deficit.  Psychiatric:        Mood and Affect: Mood normal.        Speech: Speech normal.        Behavior: Behavior normal.        Thought Content: Thought  content normal.    BP 119/86   Pulse 84   Resp 16   Wt 265 lb 6.4 oz (120.4 kg)   LMP 06/29/2015 (Exact Date)   SpO2 97%   BMI 37.02 kg/m  Wt Readings from Last 3 Encounters:  07/04/21 265 lb 6.4 oz (120.4 kg)  05/01/21 275 lb (124.7 kg)  03/20/21 277 lb (125.6 kg)     Health Maintenance Due  Topic Date Due   Pneumococcal Vaccine 29-22 Years old (1 - PCV) Never done   Zoster Vaccines- Shingrix (1 of 2) Never done   OPHTHALMOLOGY EXAM  02/03/2019   MAMMOGRAM  02/12/2020   COVID-19 Vaccine (4 - Booster for Pfizer series) 02/19/2021    There are no preventive care reminders to display for this patient.  Lab Results  Component Value Date   TSH 1.21 09/11/2016   Lab Results  Component Value Date   WBC 8.6 01/30/2021   HGB 14.0 01/30/2021   HCT 43.7 01/30/2021   MCV 91.8 01/30/2021   PLT 289 01/30/2021   Lab Results  Component Value Date   NA 143 01/30/2021   K 3.1 (L) 01/30/2021   CO2 30 01/30/2021   GLUCOSE 98 01/30/2021   BUN 12 01/30/2021   CREATININE 0.70 01/30/2021   BILITOT 0.6 10/18/2020   ALKPHOS 97 10/18/2020   AST 28 10/18/2020   ALT 27 10/18/2020   PROT 7.7 10/18/2020   ALBUMIN 3.7 10/18/2020   CALCIUM 9.3 01/30/2021   ANIONGAP 12 01/30/2021   Lab Results  Component Value Date   CHOL 147 06/06/2020   Lab Results  Component Value Date   HDL 35 (L) 06/06/2020   Lab Results  Component Value Date   LDLCALC 68 06/06/2020   Lab Results  Component Value Date   TRIG 273 (H) 06/06/2020   Lab Results  Component Value Date   CHOLHDL 4.2 06/06/2020   Lab Results  Component Value Date   HGBA1C 7.0 07/04/2021      Assessment & Plan:   Problem List Items Addressed This Visit       Cardiovascular and Mediastinum   Essential hypertension    Hypertension well controlled plan to continue hydrochlorothiazide metoprolol for now        Respiratory   Asthma, moderate persistent    Stable on Trelegy no changes made        Endocrine    Type 2 diabetes mellitus without complication, without long-term current use of insulin (HCC)    Continue metformin for now A1c down to 7.0      Relevant Orders   POCT glycosylated hemoglobin (Hb A1C) (Completed)     Nervous and Auditory   Otomycosis of right ear - Primary    Otomycosis of the right ear along with external otitis  Plan topical clotrimazole 1% solution plus Cipro HC  Referral to ENT made      Relevant Medications   clotrimazole (LOTRIMIN) 1 % external solution   Other Relevant Orders   Ambulatory referral to ENT   Otitis externa    As per otomycosis assessment      Relevant Orders   Ambulatory referral to ENT   Other Visit Diagnoses     Need for immunization against influenza       Relevant Orders   Flu Vaccine QUAD 79moIM (Fluarix, Fluzone & Alfiuria Quad PF) (Completed)       Meds ordered this encounter  Medications   ciprofloxacin-hydrocortisone (CIPRO HC) OTIC suspension    Sig: Place 3 drops into the right ear 2 (two) times daily.    Dispense:  10 mL    Refill:  0   clotrimazole (LOTRIMIN) 1 % external solution    Sig: Apply 1 application topically 2 (two) times daily. To right ear canal    Dispense:  10 mL    Refill:  0    Follow-up: Return in about 4 months (around 11/03/2021).    PAsencion Noble MD

## 2021-07-04 NOTE — Assessment & Plan Note (Signed)
Stable on Trelegy no changes made

## 2021-07-04 NOTE — Assessment & Plan Note (Signed)
Otomycosis of the right ear along with external otitis  Plan topical clotrimazole 1% solution plus Cipro HC  Referral to ENT made

## 2021-07-04 NOTE — Assessment & Plan Note (Signed)
Hypertension well controlled plan to continue hydrochlorothiazide metoprolol for now

## 2021-07-04 NOTE — Assessment & Plan Note (Signed)
As per otomycosis assessment

## 2021-07-04 NOTE — Assessment & Plan Note (Addendum)
Continue metformin for now A1c down to 7.0

## 2021-07-04 NOTE — Patient Instructions (Signed)
A flu vaccine was given  Begin Cipro HC eardrops 2 drops twice daily into the right ear canal  Begin clotrimazole 1% solution 2 drops in right ear canal twice daily, this will have to be ordered they will contact you when it arrives to our pharmacy  Referral to otolaryngology Dr. Radene Journey who is just [address] towards [facility] will be made for your ear condition  No change in other medications  Return to see Dr. Joya Gaskins 4 months

## 2021-07-05 ENCOUNTER — Other Ambulatory Visit: Payer: Self-pay

## 2021-07-06 ENCOUNTER — Other Ambulatory Visit: Payer: Self-pay

## 2021-07-11 ENCOUNTER — Other Ambulatory Visit: Payer: Self-pay

## 2021-07-12 ENCOUNTER — Other Ambulatory Visit: Payer: Self-pay

## 2021-07-16 ENCOUNTER — Other Ambulatory Visit: Payer: Self-pay

## 2021-07-30 MED FILL — Acyclovir Cap 200 MG: ORAL | 30 days supply | Qty: 60 | Fill #2 | Status: AC

## 2021-07-31 ENCOUNTER — Other Ambulatory Visit: Payer: Self-pay

## 2021-08-03 ENCOUNTER — Other Ambulatory Visit: Payer: Self-pay

## 2021-08-06 ENCOUNTER — Other Ambulatory Visit: Payer: Self-pay

## 2021-08-13 ENCOUNTER — Other Ambulatory Visit: Payer: Self-pay

## 2021-08-14 ENCOUNTER — Other Ambulatory Visit: Payer: Self-pay

## 2021-08-20 ENCOUNTER — Emergency Department (HOSPITAL_COMMUNITY): Payer: Self-pay

## 2021-08-20 ENCOUNTER — Emergency Department (HOSPITAL_COMMUNITY)
Admission: EM | Admit: 2021-08-20 | Discharge: 2021-08-20 | Disposition: A | Discharge status: Home / Self Care | Payer: Self-pay | Attending: Emergency Medicine | Admitting: Emergency Medicine

## 2021-08-20 ENCOUNTER — Encounter (HOSPITAL_COMMUNITY): Payer: Self-pay

## 2021-08-20 ENCOUNTER — Other Ambulatory Visit: Payer: Self-pay

## 2021-08-20 DIAGNOSIS — E119 Type 2 diabetes mellitus without complications: Secondary | ICD-10-CM | POA: Insufficient documentation

## 2021-08-20 DIAGNOSIS — F17210 Nicotine dependence, cigarettes, uncomplicated: Secondary | ICD-10-CM | POA: Insufficient documentation

## 2021-08-20 DIAGNOSIS — J4521 Mild intermittent asthma with (acute) exacerbation: Secondary | ICD-10-CM | POA: Insufficient documentation

## 2021-08-20 DIAGNOSIS — Z20822 Contact with and (suspected) exposure to covid-19: Secondary | ICD-10-CM | POA: Insufficient documentation

## 2021-08-20 DIAGNOSIS — Z7984 Long term (current) use of oral hypoglycemic drugs: Secondary | ICD-10-CM | POA: Insufficient documentation

## 2021-08-20 DIAGNOSIS — Z79899 Other long term (current) drug therapy: Secondary | ICD-10-CM | POA: Insufficient documentation

## 2021-08-20 DIAGNOSIS — B349 Viral infection, unspecified: Secondary | ICD-10-CM | POA: Insufficient documentation

## 2021-08-20 DIAGNOSIS — R112 Nausea with vomiting, unspecified: Secondary | ICD-10-CM | POA: Insufficient documentation

## 2021-08-20 LAB — RESP PANEL BY RT-PCR (FLU A&B, COVID) ARPGX2
Influenza A by PCR: NEGATIVE
Influenza B by PCR: NEGATIVE
SARS Coronavirus 2 by RT PCR: NEGATIVE

## 2021-08-20 MED ORDER — PREDNISONE 20 MG PO TABS: ORAL_TABLET | ORAL | 0 refills | Status: DC

## 2021-08-20 MED ORDER — GUAIFENESIN 200 MG PO TABS
200.0000 mg | ORAL_TABLET | ORAL | 0 refills | Status: DC | PRN
  Filled 2021-08-20: qty 30, 5d supply, fill #0

## 2021-08-20 MED ORDER — BENZONATATE 100 MG PO CAPS
100.0000 mg | ORAL_CAPSULE | Freq: Once | ORAL | Status: AC
  Administered 2021-08-20: 100 mg via ORAL
  Filled 2021-08-20: qty 1

## 2021-08-20 MED ORDER — ALBUTEROL SULFATE HFA 108 (90 BASE) MCG/ACT IN AERS
2.0000 | INHALATION_SPRAY | Freq: Once | RESPIRATORY_TRACT | Status: AC
  Administered 2021-08-20: 2 via RESPIRATORY_TRACT
  Filled 2021-08-20: qty 6.7

## 2021-08-20 NOTE — Discharge Instructions (Addendum)
You have been evaluated for your symptoms.  It is likely to be a viral infection which exacerbate your asthma.  Use albuterol inhaler 2 puffs every four hrs as needed for shortness of breath or wheezes.  Take steroid as prescribed however you should also monitor your blood sugar as steroid may cause a rise in your blood sugar.  Take guaifenesin as needed for cough.  Follow up with your doctor for further care.

## 2021-08-20 NOTE — ED Provider Notes (Signed)
[facility] DEPT Provider Note   CSN: [id number] Arrival date & time: 08/20/21  4196     History Chief Complaint  Patient presents with   Asthma    Sydney Williams is a 55 y.o. female.  The history is provided by the patient and medical records. No language interpreter was used.  Asthma   55 year old female significant striae of diabetes, hypertension, hyperlipidemia, tobacco use who presents with complaints of sinus congestion.  For the past 3 days patient report having sinus pressure, nasal congestion, body aches, chills, some shortness of breath, pain in her chest.  She report losing her voice with hoarseness.  She report feeling nauseous and vomited twice this morning.  She report cough is productive with green sputum.  She did try to use her inhaler this morning without relief.  She denies any fever, abdominal pain, diarrhea.  She denies any recent sick contact.  She has been vaccinated for COVID-19.     Past Medical History:  Diagnosis Date   Asthma    2015 hospitaliation for asthma   Diabetes mellitus    Type II   Family history of breast cancer    Fibroids 2010   Genital herpes    Genital herpes 11/30/2014   GERD (gastroesophageal reflux disease)    Hyperlipidemia    Hypertension    Impaired fasting blood sugar    Migraines    Obesity    Right carpal tunnel syndrome    Smoker     Patient Active Problem List   Diagnosis Date Noted   Otomycosis of right ear 07/04/2021   Otitis externa 07/04/2021   Degenerative disc disease, lumbar    History of lumbar spinal fusion 10/24/2020   Seasonal and perennial allergic rhinoconjunctivitis 09/18/2020   Carpal tunnel syndrome on right    Seronegative rheumatoid arthritis ([place]) 08/04/2020   Allergic conjunctivitis of both eyes 07/19/2020   Lumbar radiculopathy, chronic 04/03/2020   Elevated blood uric acid level 04/03/2020   Family history of cervical cancer 02/02/2018   Family history of  breast cancer 02/02/2018   Vitamin D deficiency 02/02/2018   Depression 06/19/2017   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 01/08/2017   Snoring 09/11/2016   History of migraine 09/11/2016   Frequent headaches 09/11/2016   Gastroesophageal reflux disease without esophagitis 06/01/2015   Other allergic rhinitis 06/01/2015   Tobacco user 11/30/2014   Essential hypertension 11/30/2014   Hyperlipidemia 11/30/2014   Asthma, moderate persistent 11/30/2014   Obesity 07/02/2012   Type 2 diabetes mellitus without complication, without long-term current use of insulin ([place]) 07/02/2012    Past Surgical History:  Procedure Laterality Date   BACK SURGERY  10/24/2020   CARDIAC CATHETERIZATION     CARPAL TUNNEL RELEASE Right 08/30/2020   Procedure: RIGHT CARPAL TUNNEL RELEASE;  Surgeon: Leandrew Koyanagi, MD;  Location: [facility];  Service: Orthopedics;  Laterality: Right;   COLONOSCOPY  03/13/2016   Dr. Carlean Purl, normal, repeat 2027   CYST EXCISION     left neck/postauricular region, benign   DILATION AND CURETTAGE OF UTERUS     LAPAROSCOPIC ABDOMINAL EXPLORATION     removal of ectopic preg   LEFT HEART CATH AND CORONARY ANGIOGRAPHY N/A 06/23/2017   Procedure: LEFT HEART CATH AND CORONARY ANGIOGRAPHY;  Surgeon: Nelva Bush, MD;  Location: [facility];  Service: Cardiovascular;  Laterality: N/A;   TUBAL LIGATION       OB History     Gravida  5  Para  1   Term  1   Preterm      AB  2   Living  3      SAB  1   IAB      Ectopic  1   Multiple      Live Births              Family History  Problem Relation Age of Onset   Diabetes Mother    Hypertension Mother    Aneurysm Mother    Stroke Mother    Cancer Mother        cervical cancer   Cancer Maternal Aunt        breast   Cancer Cousin        breast/breast   Lupus Cousin    Heart disease Neg Hx    Colon cancer Neg Hx    Allergic rhinitis Neg Hx    Angioedema Neg Hx    Asthma Neg Hx     Atopy Neg Hx    Eczema Neg Hx    Immunodeficiency Neg Hx    Urticaria Neg Hx     Social History   Tobacco Use   Smoking status: Every Day    Packs/day: 0.25    Years: 14.00    Pack years: 3.50    Types: Cigarettes   Smokeless tobacco: Never  Vaping Use   Vaping Use: Never used  Substance Use Topics   Alcohol use: Yes    Comment: occ   Drug use: No    Home Medications Prior to Admission medications   Medication Sig Start Date End Date Taking? Authorizing Provider  acetaminophen (TYLENOL) 500 MG tablet Take 1,000 mg by mouth every 6 (six) hours as needed for moderate pain.    [provider]  acyclovir (ZOVIRAX) 200 MG capsule TAKE 1 CAPSULE (200 MG TOTAL) BY MOUTH 2 (TWO) TIMES DAILY. 12/19/20 12/19/21  Elsie Stain, MD  albuterol (VENTOLIN HFA) 108 (90 Base) MCG/ACT inhaler INHALE 1-2 PUFFS INTO THE LUNGS EVERY 6 (SIX) HOURS AS NEEDED FOR WHEEZING OR SHORTNESS OF BREATH. 08/31/20   Elsie Stain, MD  allopurinol (ZYLOPRIM) 100 MG tablet TAKE 1 TABLET (100 MG TOTAL) BY MOUTH DAILY. 05/01/21 05/01/22  Elsie Stain, MD  amLODipine (NORVASC) 10 MG tablet TAKE 1 TABLET (10 MG TOTAL) BY MOUTH DAILY. TO LOWER BLOOD PRESSURE 05/01/21 05/01/22  Elsie Stain, MD  ascorbic acid (VITAMIN C) 500 MG tablet Take 500 mg by mouth daily.    [provider]  aspirin (EQ ASPIRIN ADULT LOW DOSE) 81 MG EC tablet Take 1 tablet (81 mg total) by mouth daily. Swallow whole. 02/03/18   Tysinger, Camelia Eng, PA-C  atorvastatin (LIPITOR) 40 MG tablet TAKE 1 TABLET (40 MG TOTAL) BY MOUTH DAILY. 05/01/21 05/01/22  Elsie Stain, MD  ciprofloxacin-hydrocortisone (CIPRO HC) OTIC suspension Place 3 drops into the right ear 2 (two) times daily. 07/04/21   Elsie Stain, MD  clotrimazole (LOTRIMIN) 1 % external solution Apply 1 application topically 2 (two) times daily. To right ear canal 07/04/21   Elsie Stain, MD  diphenhydrAMINE (BENADRYL) 25 MG tablet Take 1 tablet (25 mg total) by  mouth every 6 (six) hours as needed. 01/09/20   Tedd Sias, PA  ferrous sulfate 325 (65 FE) MG tablet Take 325 mg by mouth daily with breakfast.    [provider]  fluticasone (FLONASE) 50 MCG/ACT nasal spray Place 1 spray into both  nostrils 2 (two) times daily as needed for rhinitis. 05/01/21   Elsie Stain, MD  Fluticasone-Umeclidin-Vilant (TRELEGY ELLIPTA) 200-62.5-25 MCG/INH AEPB Inhale 1 puff into the lungs daily. Rinse mouth after each use. 05/01/21   Elsie Stain, MD  furosemide (LASIX) 20 MG tablet TAKE 1 TABLET (20 MG TOTAL) BY MOUTH DAILY AS NEEDED FOR EDEMA. 06/25/21 06/25/22  Elsie Stain, MD  hydrochlorothiazide (HYDRODIURIL) 25 MG tablet TAKE 1 TABLET (25 MG TOTAL) BY MOUTH DAILY. TO LOWER BLOOD PRESSURE 05/01/21 05/01/22  Elsie Stain, MD  ibuprofen (ADVIL) 600 MG tablet TAKE 1 TABLET (600 MG TOTAL) BY MOUTH EVERY 8 (EIGHT) HOURS AS NEEDED FOR MODERATE PAIN. TAKE AFTER EATING 05/18/21 05/18/22  Elsie Stain, MD  metFORMIN (GLUCOPHAGE) 500 MG tablet TAKE 1 TABLET (500 MG TOTAL) BY MOUTH 2 (TWO) TIMES DAILY WITH A MEAL. 05/01/21 05/01/22  Elsie Stain, MD  metoprolol tartrate (LOPRESSOR) 50 MG tablet Take 1 tablet (50 mg total) by mouth 2 (two) times daily. 05/01/21   Elsie Stain, MD  Vitamin D, Cholecalciferol, 25 MCG (1000 UT) TABS Take 1 tablet by mouth once daily 02/18/19   Tysinger, Camelia Eng, PA-C    Allergies    Lisinopril, Potassium chloride, Pneumococcal vaccines, and Vicodin [hydrocodone-acetaminophen]  Review of Systems   Review of Systems  All other systems reviewed and are negative.  Physical Exam Updated Vital Signs BP (!) 149/93 (BP Location: Right Arm)   Pulse 87   Temp 98.7 F (37.1 C) (Oral)   Resp (!) 22   LMP 06/29/2015 (Exact Date)   SpO2 93%   Physical Exam Vitals and nursing note reviewed.  Constitutional:      General: She is not in acute distress.    Appearance: She is well-developed.     Comments: Hoarse voice, but  overall well-appearing and in no acute discomfort.  HENT:     Head: Atraumatic.     Right Ear: Tympanic membrane normal.     Left Ear: Tympanic membrane normal.     Nose: Nose normal.     Mouth/Throat:     Mouth: Mucous membranes are moist.  Eyes:     Conjunctiva/sclera: Conjunctivae normal.  Cardiovascular:     Rate and Rhythm: Normal rate and regular rhythm.     Pulses: Normal pulses.     Heart sounds: Normal heart sounds.  Pulmonary:     Effort: Pulmonary effort is normal.     Breath sounds: No wheezing, rhonchi or rales.  Abdominal:     Palpations: Abdomen is soft.     Tenderness: There is no abdominal tenderness.  Musculoskeletal:     Cervical back: Neck supple.     Right lower leg: No edema.     Left lower leg: No edema.  Skin:    Findings: No rash.  Neurological:     Mental Status: She is alert. Mental status is at baseline.  Psychiatric:        Mood and Affect: Mood normal.    ED Results / Procedures / Treatments   Labs (all labs ordered are listed, but only abnormal results are displayed) Labs Reviewed  RESP PANEL BY RT-PCR (FLU A&B, COVID) [id number]    EKG None  Radiology DG Chest Portable 1 View  Result Date: 08/20/2021 CLINICAL DATA:  Shortness of breath, cough EXAM: PORTABLE CHEST 1 VIEW COMPARISON:  01/30/2021 FINDINGS: The heart size and mediastinal contours are within normal limits. Both lungs are clear. No pleural effusion. The  visualized skeletal structures are unremarkable. IMPRESSION: No acute process in the chest. Electronically Signed   By: Macy Mis M.D.   On: 08/20/2021 08:00    Procedures Procedures   Medications Ordered in ED Medications  albuterol (VENTOLIN HFA) 108 (90 Base) MCG/ACT inhaler 2 puff (2 puffs Inhalation Given 08/20/21 0751)  benzonatate (TESSALON) capsule 100 mg (100 mg Oral Given 08/20/21 0755)    ED Course  I have reviewed the triage vital signs and the nursing notes.  Pertinent labs & imaging results that were  available during my care of the patient were reviewed by me and considered in my medical decision making (see chart for details).    MDM Rules/Calculators/A&P                           BP (!) 149/93 (BP Location: Right Arm)   Pulse 87   Temp 98.7 F (37.1 C) (Oral)   Resp (!) 22   LMP 06/29/2015 (Exact Date)   SpO2 93%   Final Clinical Impression(s) / ED Diagnoses Final diagnoses:  Mild intermittent asthma with acute exacerbation  Viral illness    Rx / DC Orders ED Discharge Orders          Ordered    predniSONE (DELTASONE) 20 MG tablet        08/20/21 0901    guaiFENesin 200 MG tablet  Every 4 hours PRN        08/20/21 0901           7:46 AM Patient here with cold symptoms.  Endorse hoarseness, nauseous vomiting productive cough and sinus pressure.  When ambulating, O2 sats maintained at 93% on room air.  She does have history of asthma but no significant wheezing on my exam.  Work-up initiated.  8:54 AM Chest x-ray without signs of pneumonia.  Patient is negative for flu and COVID.  Patient given albuterol inhaler, due to the hoarseness and her symptoms, she would benefit from a burst and taper course of steroid as well as cough medication.  Suspect viral illness causing asthma exacerbation.  She is otherwise stable for discharge.   Domenic Moras, PA-C 08/20/21 [place], [place], DO 08/20/21 1302

## 2021-08-20 NOTE — ED Notes (Signed)
Pt ambulated down the hall and O2 stayed at 93% on RA

## 2021-08-20 NOTE — ED Triage Notes (Signed)
Pt reports trouble with asthma for about 3 days. She also states that she believes she might have a sinus infection.

## 2021-08-21 ENCOUNTER — Other Ambulatory Visit: Payer: Self-pay

## 2021-08-21 MED ORDER — PREDNISONE 20 MG PO TABS
ORAL_TABLET | ORAL | 0 refills | Status: DC
  Filled 2021-08-21: qty 11, 5d supply, fill #0

## 2021-08-22 ENCOUNTER — Emergency Department (HOSPITAL_COMMUNITY): Payer: Self-pay

## 2021-08-22 ENCOUNTER — Other Ambulatory Visit: Payer: Self-pay

## 2021-08-22 ENCOUNTER — Encounter (HOSPITAL_COMMUNITY): Payer: Self-pay

## 2021-08-22 ENCOUNTER — Ambulatory Visit: Payer: Self-pay | Admitting: *Deleted

## 2021-08-22 ENCOUNTER — Emergency Department (HOSPITAL_COMMUNITY)
Admission: EM | Admit: 2021-08-22 | Discharge: 2021-08-22 | Disposition: A | Discharge status: Home / Self Care | Payer: Self-pay | Attending: Student | Admitting: Student

## 2021-08-22 DIAGNOSIS — F17210 Nicotine dependence, cigarettes, uncomplicated: Secondary | ICD-10-CM | POA: Insufficient documentation

## 2021-08-22 DIAGNOSIS — E1169 Type 2 diabetes mellitus with other specified complication: Secondary | ICD-10-CM | POA: Insufficient documentation

## 2021-08-22 DIAGNOSIS — Z7982 Long term (current) use of aspirin: Secondary | ICD-10-CM | POA: Insufficient documentation

## 2021-08-22 DIAGNOSIS — J069 Acute upper respiratory infection, unspecified: Secondary | ICD-10-CM

## 2021-08-22 DIAGNOSIS — J019 Acute sinusitis, unspecified: Secondary | ICD-10-CM

## 2021-08-22 DIAGNOSIS — J3489 Other specified disorders of nose and nasal sinuses: Secondary | ICD-10-CM | POA: Insufficient documentation

## 2021-08-22 DIAGNOSIS — Z79899 Other long term (current) drug therapy: Secondary | ICD-10-CM | POA: Insufficient documentation

## 2021-08-22 DIAGNOSIS — Z7984 Long term (current) use of oral hypoglycemic drugs: Secondary | ICD-10-CM | POA: Insufficient documentation

## 2021-08-22 DIAGNOSIS — Z20822 Contact with and (suspected) exposure to covid-19: Secondary | ICD-10-CM | POA: Insufficient documentation

## 2021-08-22 DIAGNOSIS — I10 Essential (primary) hypertension: Secondary | ICD-10-CM | POA: Insufficient documentation

## 2021-08-22 DIAGNOSIS — J45909 Unspecified asthma, uncomplicated: Secondary | ICD-10-CM | POA: Insufficient documentation

## 2021-08-22 DIAGNOSIS — R059 Cough, unspecified: Secondary | ICD-10-CM | POA: Insufficient documentation

## 2021-08-22 DIAGNOSIS — E785 Hyperlipidemia, unspecified: Secondary | ICD-10-CM | POA: Insufficient documentation

## 2021-08-22 LAB — RESP PANEL BY RT-PCR (FLU A&B, COVID) ARPGX2
Influenza A by PCR: NEGATIVE
Influenza B by PCR: NEGATIVE
SARS Coronavirus 2 by RT PCR: NEGATIVE

## 2021-08-22 LAB — CBG MONITORING, ED: Glucose-Capillary: 351 mg/dL — ABNORMAL HIGH (ref 70–99)

## 2021-08-22 MED ORDER — BENZONATATE 100 MG PO CAPS
100.0000 mg | ORAL_CAPSULE | Freq: Three times a day (TID) | ORAL | 0 refills | Status: DC
  Filled 2021-08-22: qty 21, 7d supply, fill #0

## 2021-08-22 MED ORDER — AMOXICILLIN-POT CLAVULANATE 875-125 MG PO TABS
1.0000 | ORAL_TABLET | Freq: Two times a day (BID) | ORAL | 0 refills | Status: DC
  Filled 2021-08-22: qty 14, 7d supply, fill #0

## 2021-08-22 NOTE — Telephone Encounter (Signed)
Pt seen in ED 08/20/21 for cough, SOB   "Mild intermittent asthma with acute exacerbation  Viral illness" per notes.  Reports worsening today. Voice hoarse, cough productive for green phlegm with streaks of blood. Started Prednisone taper Monday, inhaler. Using inhaler, ineffective.  States cough is severe,SOB at rest. Pt sounds distressed. Advised ED. Pt states will follow disposition.

## 2021-08-22 NOTE — ED Triage Notes (Signed)
Pt arrived via POV, c/o worsening cough, sore throat, and worsening issues with asthma. Was seen 10/24 but sx worsening.

## 2021-08-22 NOTE — Telephone Encounter (Signed)
Per epic patient is currently being seen in ED.

## 2021-08-22 NOTE — ED Notes (Signed)
An After Visit Summary was printed and given to the patient. Discharge instructions given and no further questions at this time.  

## 2021-08-22 NOTE — Telephone Encounter (Signed)
Reason for Disposition  [1] MODERATE difficulty breathing (e.g., speaks in phrases, SOB even at rest, pulse 100-120) AND [2] still present when not coughing  Answer Assessment - Initial Assessment Questions 1. ONSET: "When did the cough begin?"      7 days ago 2. SEVERITY: "How bad is the cough today?"      Severe 3. SPUTUM: "Describe the color of your sputum" (none, dry cough; clear, white, yellow, green)     greenish 4. HEMOPTYSIS: "Are you coughing up any blood?" If so ask: "How much?" (flecks, streaks, tablespoons, etc.)   yes 5. DIFFICULTY BREATHING: "Are you having difficulty breathing?" If Yes, ask: "How bad is it?" (e.g., mild, moderate, severe)    - MILD: No SOB at rest, mild SOB with walking, speaks normally in sentences, can lie down, no retractions, pulse < 100.    - MODERATE: SOB at rest, SOB with minimal exertion and prefers to sit, cannot lie down flat, speaks in phrases, mild retractions, audible wheezing, pulse 100-120.    - SEVERE: Very SOB at rest, speaks in single words, struggling to breathe, sitting hunched forward, retractions, pulse > 120      At times 6. FEVER: "Do you have a fever?" If Yes, ask: "What is your temperature, how was it measured, and when did it start?"     no 7. CARDIAC HISTORY: "Do you have any history of heart disease?" (e.g., heart attack, congestive heart failure)      *No Answer* 8. LUNG HISTORY: "Do you have any history of lung disease?"  (e.g., pulmonary embolus, asthma, emphysema)     *No Answer* 9. PE RISK FACTORS: "Do you have a history of blood clots?" (or: recent major surgery, recent prolonged travel, bedridden)     *No Answer* 10. OTHER SYMPTOMS: "Do you have any other symptoms?" (e.g., runny nose, wheezing, chest pain)       Yes wheezing, chest tightness  Protocols used: Cough - Acute Productive-A-AH

## 2021-08-22 NOTE — ED Provider Notes (Signed)
[facility] DEPT Provider Note   CSN: [id number] Arrival date & time: 08/22/21  0944     History Chief Complaint  Patient presents with   Cough    Sydney Williams is a 55 y.o. female with PMH asthma, T2DM, HLD, HTN who presents the emergency department for evaluation of cough, sinus pain and hoarseness.  Patient states that she was seen in the emergency department 2 days ago for similar symptoms and was discharged with albuterol, prednisone and guaifenesin.  She states that her breathing has improved but her cough is worsened and the pain coming from her sinuses worsen.  She endorses bloody mucus draining from the left nare.  Denies fever, chest pain, shortness of breath, abdominal pain, nausea, vomiting or other systemic symptoms.   Cough Associated symptoms: rhinorrhea and sore throat   Associated symptoms: no chest pain, no chills, no ear pain, no fever, no rash and no shortness of breath       Past Medical History:  Diagnosis Date   Asthma    2015 hospitaliation for asthma   Diabetes mellitus    Type II   Family history of breast cancer    Fibroids 2010   Genital herpes    Genital herpes 11/30/2014   GERD (gastroesophageal reflux disease)    Hyperlipidemia    Hypertension    Impaired fasting blood sugar    Migraines    Obesity    Right carpal tunnel syndrome    Smoker     Patient Active Problem List   Diagnosis Date Noted   Otomycosis of right ear 07/04/2021   Otitis externa 07/04/2021   Degenerative disc disease, lumbar    History of lumbar spinal fusion 10/24/2020   Seasonal and perennial allergic rhinoconjunctivitis 09/18/2020   Carpal tunnel syndrome on right    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   Allergic conjunctivitis of both eyes 07/19/2020   Lumbar radiculopathy, chronic 04/03/2020   Elevated blood uric acid level 04/03/2020   Family history of cervical cancer 02/02/2018   Family history of breast cancer  02/02/2018   Vitamin D deficiency 02/02/2018   Depression 06/19/2017   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 01/08/2017   Snoring 09/11/2016   History of migraine 09/11/2016   Frequent headaches 09/11/2016   Gastroesophageal reflux disease without esophagitis 06/01/2015   Other allergic rhinitis 06/01/2015   Tobacco user 11/30/2014   Essential hypertension 11/30/2014   Hyperlipidemia 11/30/2014   Asthma, moderate persistent 11/30/2014   Obesity 07/02/2012   Type 2 diabetes mellitus without complication, without long-term current use of insulin ([place]) 07/02/2012    Past Surgical History:  Procedure Laterality Date   BACK SURGERY  10/24/2020   CARDIAC CATHETERIZATION     CARPAL TUNNEL RELEASE Right 08/30/2020   Procedure: RIGHT CARPAL TUNNEL RELEASE;  Surgeon: Leandrew Koyanagi, MD;  Location: [facility];  Service: Orthopedics;  Laterality: Right;   COLONOSCOPY  03/13/2016   Dr. Carlean Purl, normal, repeat 2027   CYST EXCISION     left neck/postauricular region, benign   DILATION AND CURETTAGE OF UTERUS     LAPAROSCOPIC ABDOMINAL EXPLORATION     removal of ectopic preg   LEFT HEART CATH AND CORONARY ANGIOGRAPHY N/A 06/23/2017   Procedure: LEFT HEART CATH AND CORONARY ANGIOGRAPHY;  Surgeon: Nelva Bush, MD;  Location: [facility];  Service: Cardiovascular;  Laterality: N/A;   TUBAL LIGATION       OB History     Gravida  5  Para  1   Term  1   Preterm      AB  2   Living  3      SAB  1   IAB      Ectopic  1   Multiple      Live Births              Family History  Problem Relation Age of Onset   Diabetes Mother    Hypertension Mother    Aneurysm Mother    Stroke Mother    Cancer Mother        cervical cancer   Cancer Maternal Aunt        breast   Cancer Cousin        breast/breast   Lupus Cousin    Heart disease Neg Hx    Colon cancer Neg Hx    Allergic rhinitis Neg Hx    Angioedema Neg Hx    Asthma Neg Hx    Atopy Neg  Hx    Eczema Neg Hx    Immunodeficiency Neg Hx    Urticaria Neg Hx     Social History   Tobacco Use   Smoking status: Every Day    Packs/day: 0.25    Years: 14.00    Pack years: 3.50    Types: Cigarettes   Smokeless tobacco: Never  Vaping Use   Vaping Use: Never used  Substance Use Topics   Alcohol use: Yes    Comment: occ   Drug use: No    Home Medications Prior to Admission medications   Medication Sig Start Date End Date Taking? Authorizing Provider  acetaminophen (TYLENOL) 500 MG tablet Take 1,000 mg by mouth every 6 (six) hours as needed for moderate pain.    [provider]  acyclovir (ZOVIRAX) 200 MG capsule TAKE 1 CAPSULE (200 MG TOTAL) BY MOUTH 2 (TWO) TIMES DAILY. 12/19/20 12/19/21  Elsie Stain, MD  albuterol (VENTOLIN HFA) 108 (90 Base) MCG/ACT inhaler INHALE 1-2 PUFFS INTO THE LUNGS EVERY 6 (SIX) HOURS AS NEEDED FOR WHEEZING OR SHORTNESS OF BREATH. 08/31/20   Elsie Stain, MD  allopurinol (ZYLOPRIM) 100 MG tablet TAKE 1 TABLET (100 MG TOTAL) BY MOUTH DAILY. 05/01/21 05/01/22  Elsie Stain, MD  amLODipine (NORVASC) 10 MG tablet TAKE 1 TABLET (10 MG TOTAL) BY MOUTH DAILY. TO LOWER BLOOD PRESSURE 05/01/21 05/01/22  Elsie Stain, MD  ascorbic acid (VITAMIN C) 500 MG tablet Take 500 mg by mouth daily.    [provider]  aspirin (EQ ASPIRIN ADULT LOW DOSE) 81 MG EC tablet Take 1 tablet (81 mg total) by mouth daily. Swallow whole. 02/03/18   Tysinger, Camelia Eng, PA-C  atorvastatin (LIPITOR) 40 MG tablet TAKE 1 TABLET (40 MG TOTAL) BY MOUTH DAILY. 05/01/21 05/01/22  Elsie Stain, MD  ciprofloxacin-hydrocortisone (CIPRO HC) OTIC suspension Place 3 drops into the right ear 2 (two) times daily. 07/04/21   Elsie Stain, MD  clotrimazole (LOTRIMIN) 1 % external solution Apply 1 application topically 2 (two) times daily. To right ear canal 07/04/21   Elsie Stain, MD  diphenhydrAMINE (BENADRYL) 25 MG tablet Take 1 tablet (25 mg total) by mouth every 6  (six) hours as needed. 01/09/20   Tedd Sias, PA  ferrous sulfate 325 (65 FE) MG tablet Take 325 mg by mouth daily with breakfast.    [provider]  fluticasone (FLONASE) 50 MCG/ACT nasal spray Place 1 spray into both  nostrils 2 (two) times daily as needed for rhinitis. 05/01/21   Elsie Stain, MD  Fluticasone-Umeclidin-Vilant (TRELEGY ELLIPTA) 200-62.5-25 MCG/INH AEPB Inhale 1 puff into the lungs daily. Rinse mouth after each use. 05/01/21   Elsie Stain, MD  furosemide (LASIX) 20 MG tablet TAKE 1 TABLET (20 MG TOTAL) BY MOUTH DAILY AS NEEDED FOR EDEMA. 06/25/21 06/25/22  Elsie Stain, MD  guaiFENesin 200 MG tablet Take 1 tablet (200 mg total) by mouth every 4 (four) hours as needed for cough or to loosen phlegm. 08/20/21   Domenic Moras, PA-C  hydrochlorothiazide (HYDRODIURIL) 25 MG tablet TAKE 1 TABLET (25 MG TOTAL) BY MOUTH DAILY. TO LOWER BLOOD PRESSURE 05/01/21 05/01/22  Elsie Stain, MD  ibuprofen (ADVIL) 600 MG tablet TAKE 1 TABLET (600 MG TOTAL) BY MOUTH EVERY 8 (EIGHT) HOURS AS NEEDED FOR MODERATE PAIN. TAKE AFTER EATING 05/18/21 05/18/22  Elsie Stain, MD  metFORMIN (GLUCOPHAGE) 500 MG tablet TAKE 1 TABLET (500 MG TOTAL) BY MOUTH 2 (TWO) TIMES DAILY WITH A MEAL. 05/01/21 05/01/22  Elsie Stain, MD  metoprolol tartrate (LOPRESSOR) 50 MG tablet Take 1 tablet (50 mg total) by mouth 2 (two) times daily. 05/01/21   Elsie Stain, MD  predniSONE (DELTASONE) 20 MG tablet 3 tabs po day one, then 2 tabs daily x 4 days 08/20/21   Domenic Moras, PA-C  predniSONE (DELTASONE) 20 MG tablet Take 3 tablets by mouth on day 1, then 2 tablets daily for 4 days. 08/20/21   Domenic Moras, PA-C  Vitamin D, Cholecalciferol, 25 MCG (1000 UT) TABS Take 1 tablet by mouth once daily 02/18/19   Tysinger, Camelia Eng, PA-C    Allergies    Lisinopril, Potassium chloride, Pneumococcal vaccines, and Vicodin [hydrocodone-acetaminophen]  Review of Systems   Review of Systems  Constitutional:   Negative for chills and fever.  HENT:  Positive for rhinorrhea, sinus pressure, sinus pain, sore throat and voice change. Negative for ear pain.   Eyes:  Negative for pain and visual disturbance.  Respiratory:  Positive for cough. Negative for shortness of breath.   Cardiovascular:  Negative for chest pain and palpitations.  Gastrointestinal:  Negative for abdominal pain and vomiting.  Genitourinary:  Negative for dysuria and hematuria.  Musculoskeletal:  Negative for arthralgias and back pain.  Skin:  Negative for color change and rash.  Neurological:  Negative for seizures and syncope.  All other systems reviewed and are negative.  Physical Exam Updated Vital Signs BP 124/86 (BP Location: Left Arm)   Pulse 82   Temp 98.2 F (36.8 C) (Oral)   Resp 18   LMP 06/29/2015 (Exact Date)   SpO2 96%   Physical Exam Vitals and nursing note reviewed.  Constitutional:      General: She is not in acute distress.    Appearance: She is well-developed.  HENT:     Head: Normocephalic and atraumatic.     Nose: Rhinorrhea present.     Comments: Bilateral rhinorrhea, erythema greater in left nare than right    Mouth/Throat:     Pharynx: No oropharyngeal exudate or posterior oropharyngeal erythema.  Eyes:     Conjunctiva/sclera: Conjunctivae normal.  Cardiovascular:     Rate and Rhythm: Normal rate and regular rhythm.     Heart sounds: No murmur heard. Pulmonary:     Effort: Pulmonary effort is normal. No respiratory distress.     Breath sounds: Normal breath sounds.  Abdominal:     Palpations: Abdomen is soft.  Tenderness: There is no abdominal tenderness.  Musculoskeletal:     Cervical back: Neck supple.  Skin:    General: Skin is warm and dry.  Neurological:     Mental Status: She is alert.    ED Results / Procedures / Treatments   Labs (all labs ordered are listed, but only abnormal results are displayed) Labs Reviewed  RESP PANEL BY RT-PCR (FLU A&B, COVID) [id number]     EKG None  Radiology DG Chest 2 View  Result Date: 08/22/2021 CLINICAL DATA:  Cough EXAM: CHEST - 2 VIEW COMPARISON:  08/20/2021 FINDINGS: The heart size and mediastinal contours are within normal limits. Both lungs are clear. Right cervical rib is seen. IMPRESSION: No active cardiopulmonary disease. Reading location: [place], [place]. Electronically Signed   By: Elmer Picker M.D.   On: 08/22/2021 11:37    Procedures Procedures   Medications Ordered in ED Medications - No data to display  ED Course  I have reviewed the triage vital signs and the nursing notes.  Pertinent labs & imaging results that were available during my care of the patient were reviewed by me and considered in my medical decision making (see chart for details).    MDM Rules/Calculators/A&P                           Patient seen emergency department for evaluation of sinus pain, cough, hoarseness.  Physical exam reveals pain over the maxillary sinus to palpation with erythematous nares worse on the left than the right.  Oropharyngeal exam unremarkable.  Chest x-ray unremarkable.  COVID and flu negative.  POC glucose elevated to 351 likely elevated in the setting of her current steroid use.  Patient presentation consistent with sinusitis and we will trial a course of Augmentin.  She was given Ladona Ridgel for her cough and discharged with outpatient PCP follow-up. Final Clinical Impression(s) / ED Diagnoses Final diagnoses:  None    Rx / DC Orders ED Discharge Orders     None        Lyndall Bellot, MD 08/22/21 1254

## 2021-08-22 NOTE — ED Provider Notes (Signed)
Emergency Medicine Provider Triage Evaluation Note  Sydney Williams , a 56 y.o. female  was evaluated in triage.  Pt complains of productive cough, nasal congestion, rhinorrhea, and voice changes for the past six days. The patient was seen here on 08/20/21 and was given an albuterol inhaler, prednisone, and guaifensin. The patient reports she has not improved and keeps coughing. Denies any chest pain, SOB, or fever.  Review of Systems  Positive: Cough, voice change, nasal congestion, rhinorrhea. Negative: hest pain, SOB, or fever.  Physical Exam  BP 124/86 (BP Location: Left Arm)   Pulse 82   Temp 98.2 F (36.8 C) (Oral)   Resp 18   LMP 06/29/2015 (Exact Date)   SpO2 96%  Gen:   Awake, no distress, voice change Resp:  Normal effort. CTAB. MSK:   Moves extremities without difficulty  Other:  OP clear. Airway patent.   Medical Decision Making  Medically screening exam initiated at 11:01 AM.  Appropriate orders placed.  ANGELISSA SUPAN was informed that the remainder of the evaluation will be completed by another provider, this initial triage assessment does not replace that evaluation, and the importance of remaining in the ED until their evaluation is complete.  Labs and CXR ordered.   Sherrell Puller, PA-C 08/22/21 [place], MD 08/22/21 1535

## 2021-08-23 ENCOUNTER — Other Ambulatory Visit: Payer: Self-pay

## 2021-08-28 ENCOUNTER — Ambulatory Visit: Payer: Medicaid Other | Admitting: Critical Care Medicine

## 2021-08-29 ENCOUNTER — Other Ambulatory Visit: Payer: Self-pay

## 2021-08-29 ENCOUNTER — Encounter: Payer: Self-pay | Admitting: Critical Care Medicine

## 2021-08-29 ENCOUNTER — Ambulatory Visit: Payer: MEDICAID | Attending: Critical Care Medicine | Admitting: Critical Care Medicine

## 2021-08-29 DIAGNOSIS — J209 Acute bronchitis, unspecified: Secondary | ICD-10-CM | POA: Insufficient documentation

## 2021-08-29 DIAGNOSIS — E119 Type 2 diabetes mellitus without complications: Secondary | ICD-10-CM

## 2021-08-29 DIAGNOSIS — B370 Candidal stomatitis: Secondary | ICD-10-CM | POA: Insufficient documentation

## 2021-08-29 MED ORDER — FLUCONAZOLE 100 MG PO TABS
ORAL_TABLET | ORAL | 0 refills | Status: DC
  Filled 2021-08-29: qty 7, 6d supply, fill #0

## 2021-08-29 MED ORDER — PROMETHAZINE-DM 6.25-15 MG/5ML PO SYRP
5.0000 mL | ORAL_SOLUTION | Freq: Four times a day (QID) | ORAL | 0 refills | Status: DC | PRN
  Filled 2021-08-29: qty 240, 12d supply, fill #0

## 2021-08-29 NOTE — Assessment & Plan Note (Signed)
Acute bronchitis status post antibiotic therapy with slow resolution on top of seasonal allergies and moderate persistent asthma  Plan to prescribe oral antitussive  No additional antibiotics indicated

## 2021-08-29 NOTE — Assessment & Plan Note (Signed)
Patient had been given steroids blood sugar had increased told to stay on metformin 500 mg twice daily for now

## 2021-08-29 NOTE — Assessment & Plan Note (Signed)
Oral thrush is a side effect from recent antibiotics we will prescribe oral Diflucan for 5 days

## 2021-08-29 NOTE — Progress Notes (Signed)
Established Patient Office Visit  Subjective:  Patient ID: Sydney Williams, female    DOB: 31-Dec-1965  Age: 55 y.o. MRN: [id number] Virtual Visit via Telephone Note  I connected with Sydney Williams on 08/29/21 at 10:00 AM EDT by telephone and verified that I am speaking with the correct person using two identifiers.   Consent:  I discussed the limitations, risks, security and privacy concerns of performing an evaluation and management service by telephone and the availability of in person appointments. I also discussed with the patient that there may be a patient responsible charge related to this service. The patient expressed understanding and agreed to proceed.  Location of patient: Patient's at home  Location of provider: I am in my office  Persons participating in the televisit with the patient.   No one else on the call    History of Present Illness: CC:  Chief Complaint  Patient presents with   Hospitalization Follow-up    ED    HPI Sydney Williams presents for post ER follow-up.  Patient was seen last week with an upper respiratory tract infection given a course of antibiotic Augmentin.  Note she was flu and COVID-negative.  Patient states she still has a dry cough she was having a productive cough of thick yellow-green.  She still has some hoarseness her throat and tongue appear to have a white coating to her review.  She states that benzonatate has not helped her cough.  Patient states the itching she had in her ear previously has resolved with the eardrops we gave her and she did not follow-up with ENT  Past Medical History:  Diagnosis Date   Asthma    2015 hospitaliation for asthma   Diabetes mellitus    Type II   Family history of breast cancer    Fibroids 2010   Genital herpes    Genital herpes 11/30/2014   GERD (gastroesophageal reflux disease)    Hyperlipidemia    Hypertension    Impaired fasting blood sugar    Migraines    Obesity    Right carpal tunnel  syndrome    Smoker     Past Surgical History:  Procedure Laterality Date   BACK SURGERY  10/24/2020   CARDIAC CATHETERIZATION     CARPAL TUNNEL RELEASE Right 08/30/2020   Procedure: RIGHT CARPAL TUNNEL RELEASE;  Surgeon: Leandrew Koyanagi, MD;  Location: [facility];  Service: Orthopedics;  Laterality: Right;   COLONOSCOPY  03/13/2016   Dr. Carlean Purl, normal, repeat 2027   CYST EXCISION     left neck/postauricular region, benign   DILATION AND CURETTAGE OF UTERUS     LAPAROSCOPIC ABDOMINAL EXPLORATION     removal of ectopic preg   LEFT HEART CATH AND CORONARY ANGIOGRAPHY N/A 06/23/2017   Procedure: LEFT HEART CATH AND CORONARY ANGIOGRAPHY;  Surgeon: Nelva Bush, MD;  Location: [facility];  Service: Cardiovascular;  Laterality: N/A;   TUBAL LIGATION      Family History  Problem Relation Age of Onset   Diabetes Mother    Hypertension Mother    Aneurysm Mother    Stroke Mother    Cancer Mother        cervical cancer   Cancer Maternal Aunt        breast   Cancer Cousin        breast/breast   Lupus Cousin    Heart disease Neg Hx    Colon cancer Neg Hx  Allergic rhinitis Neg Hx    Angioedema Neg Hx    Asthma Neg Hx    Atopy Neg Hx    Eczema Neg Hx    Immunodeficiency Neg Hx    Urticaria Neg Hx     Social History   Socioeconomic History   Marital status: Legally Separated    Spouse name: Not on file   Number of children: Not on file   Years of education: Not on file   Highest education level: Not on file  Occupational History   Not on file  Tobacco Use   Smoking status: Every Day    Packs/day: 0.25    Years: 14.00    Pack years: 3.50    Types: Cigarettes   Smokeless tobacco: Never  Vaping Use   Vaping Use: Never used  Substance and Sexual Activity   Alcohol use: Yes    Comment: occ   Drug use: No   Sexual activity: Yes    Birth control/protection: Surgical  Other Topics Concern   Not on file  Social History Narrative   Married,  and her granddaughter lives with her, works as Quarry manager at [employer], exercise - activity at work, walking.  12/2016   Social Determinants of Health   Financial Resource Strain: Not on file  Food Insecurity: Not on file  Transportation Needs: Not on file  Physical Activity: Not on file  Stress: Not on file  Social Connections: Not on file  Intimate Partner Violence: Not on file    Outpatient Medications Prior to Visit  Medication Sig Dispense Refill   acetaminophen (TYLENOL) 500 MG tablet Take 1,000 mg by mouth every 6 (six) hours as needed for moderate pain.     acyclovir (ZOVIRAX) 200 MG capsule TAKE 1 CAPSULE (200 MG TOTAL) BY MOUTH 2 (TWO) TIMES DAILY. 180 capsule 3   albuterol (VENTOLIN HFA) 108 (90 Base) MCG/ACT inhaler INHALE 1-2 PUFFS INTO THE LUNGS EVERY 6 (SIX) HOURS AS NEEDED FOR WHEEZING OR SHORTNESS OF BREATH. 18 g 0   allopurinol (ZYLOPRIM) 100 MG tablet TAKE 1 TABLET (100 MG TOTAL) BY MOUTH DAILY. 90 tablet 2   amLODipine (NORVASC) 10 MG tablet TAKE 1 TABLET (10 MG TOTAL) BY MOUTH DAILY. TO LOWER BLOOD PRESSURE 90 tablet 2   ascorbic acid (VITAMIN C) 500 MG tablet Take 500 mg by mouth daily.     aspirin (EQ ASPIRIN ADULT LOW DOSE) 81 MG EC tablet Take 1 tablet (81 mg total) by mouth daily. Swallow whole. 90 tablet 3   atorvastatin (LIPITOR) 40 MG tablet TAKE 1 TABLET (40 MG TOTAL) BY MOUTH DAILY. 90 tablet 2   diphenhydrAMINE (BENADRYL) 25 MG tablet Take 1 tablet (25 mg total) by mouth every 6 (six) hours as needed. 30 tablet 0   ferrous sulfate 325 (65 FE) MG tablet Take 325 mg by mouth daily with breakfast.     fluticasone (FLONASE) 50 MCG/ACT nasal spray Place 1 spray into both nostrils 2 (two) times daily as needed for rhinitis. 16 g 5   Fluticasone-Umeclidin-Vilant (TRELEGY ELLIPTA) 200-62.5-25 MCG/INH AEPB Inhale 1 puff into the lungs daily. Rinse mouth after each use. 60 each 5   furosemide (LASIX) 20 MG tablet TAKE 1 TABLET (20 MG TOTAL) BY MOUTH DAILY AS NEEDED FOR  EDEMA. 30 tablet 1   hydrochlorothiazide (HYDRODIURIL) 25 MG tablet TAKE 1 TABLET (25 MG TOTAL) BY MOUTH DAILY. TO LOWER BLOOD PRESSURE 90 tablet 3   ibuprofen (ADVIL) 600 MG tablet TAKE 1 TABLET (600  MG TOTAL) BY MOUTH EVERY 8 (EIGHT) HOURS AS NEEDED FOR MODERATE PAIN. TAKE AFTER EATING 90 tablet 1   metFORMIN (GLUCOPHAGE) 500 MG tablet TAKE 1 TABLET (500 MG TOTAL) BY MOUTH 2 (TWO) TIMES DAILY WITH A MEAL. 90 tablet 3   metoprolol tartrate (LOPRESSOR) 50 MG tablet Take 1 tablet (50 mg total) by mouth 2 (two) times daily. 60 tablet 2   Vitamin D, Cholecalciferol, 25 MCG (1000 UT) TABS Take 1 tablet by mouth once daily 90 tablet 0   amoxicillin-clavulanate (AUGMENTIN) 875-125 MG tablet Take 1 tablet by mouth every 12 (twelve) hours. (Patient not taking: Reported on 08/29/2021) 14 tablet 0   benzonatate (TESSALON) 100 MG capsule Take 1 capsule (100 mg total) by mouth every 8 (eight) hours. (Patient not taking: Reported on 08/29/2021) 21 capsule 0   ciprofloxacin-hydrocortisone (CIPRO HC) OTIC suspension Place 3 drops into the right ear 2 (two) times daily. (Patient not taking: Reported on 08/29/2021) 10 mL 0   clotrimazole (LOTRIMIN) 1 % external solution Apply 1 application topically 2 (two) times daily. To right ear canal (Patient not taking: Reported on 08/29/2021) 10 mL 0   guaiFENesin 200 MG tablet Take 1 tablet (200 mg total) by mouth every 4 (four) hours as needed for cough or to loosen phlegm. (Patient not taking: Reported on 08/29/2021) 30 tablet 0   predniSONE (DELTASONE) 20 MG tablet 3 tabs po day one, then 2 tabs daily x 4 days (Patient not taking: Reported on 08/29/2021) 11 tablet 0   predniSONE (DELTASONE) 20 MG tablet Take 3 tablets by mouth on day 1, then 2 tablets daily for 4 days. (Patient not taking: Reported on 08/29/2021) 11 tablet 0   No facility-administered medications prior to visit.    Allergies  Allergen Reactions   Lisinopril Swelling   Potassium Chloride Shortness Of Breath  and Swelling    Tolerates IV KCl, reaction only to PO product   Pneumococcal Vaccines Swelling and Other (See Comments)    Reaction:  Swelling at injection site   Vicodin [Hydrocodone-Acetaminophen] Itching and Rash    ROS Review of Systems  Constitutional:  Negative for chills, diaphoresis and fever.  HENT:  Positive for postnasal drip, rhinorrhea, sore throat and voice change. Negative for congestion, ear pain, hearing loss, mouth sores, nosebleeds, sinus pressure, sinus pain and tinnitus.        White coating  Eyes:  Negative for photophobia and redness.  Respiratory:  Positive for cough, shortness of breath and wheezing. Negative for stridor.        Dry cough, was productive thick green  Cardiovascular:  Negative for chest pain, palpitations and leg swelling.  Gastrointestinal:  Negative for abdominal pain, blood in stool, constipation, diarrhea, nausea and vomiting.  Endocrine: Negative for polydipsia.  Genitourinary:  Negative for dysuria, flank pain, frequency, hematuria and urgency.  Musculoskeletal:  Negative for back pain, myalgias and neck pain.  Skin:  Negative for rash.  Allergic/Immunologic: Negative for environmental allergies.  Neurological:  Negative for dizziness, tremors, seizures, weakness and headaches.  Hematological:  Does not bruise/bleed easily.  Psychiatric/Behavioral:  Negative for suicidal ideas. The patient is not nervous/anxious.      Objective:    Physical Exam No exam this is a phone visit LMP 06/29/2015 (Exact Date)  Wt Readings from Last 3 Encounters:  07/04/21 265 lb 6.4 oz (120.4 kg)  05/01/21 275 lb (124.7 kg)  03/20/21 277 lb (125.6 kg)     Health Maintenance Due  Topic Date Due  Pneumococcal Vaccine 45-41 Years old (1 - PCV) Never done   Zoster Vaccines- Shingrix (1 of 2) Never done   OPHTHALMOLOGY EXAM  02/03/2019   MAMMOGRAM  02/12/2020   COVID-19 Vaccine (4 - Booster for Pfizer series) 01/16/2021    There are no preventive  care reminders to display for this patient.  Lab Results  Component Value Date   TSH 1.21 09/11/2016   Lab Results  Component Value Date   WBC 8.6 01/30/2021   HGB 14.0 01/30/2021   HCT 43.7 01/30/2021   MCV 91.8 01/30/2021   PLT 289 01/30/2021   Lab Results  Component Value Date   NA 143 01/30/2021   K 3.1 (L) 01/30/2021   CO2 30 01/30/2021   GLUCOSE 98 01/30/2021   BUN 12 01/30/2021   CREATININE 0.70 01/30/2021   BILITOT 0.6 10/18/2020   ALKPHOS 97 10/18/2020   AST 28 10/18/2020   ALT 27 10/18/2020   PROT 7.7 10/18/2020   ALBUMIN 3.7 10/18/2020   CALCIUM 9.3 01/30/2021   ANIONGAP 12 01/30/2021   Lab Results  Component Value Date   CHOL 147 06/06/2020   Lab Results  Component Value Date   HDL 35 (L) 06/06/2020   Lab Results  Component Value Date   LDLCALC 68 06/06/2020   Lab Results  Component Value Date   TRIG 273 (H) 06/06/2020   Lab Results  Component Value Date   CHOLHDL 4.2 06/06/2020   Lab Results  Component Value Date   HGBA1C 7.0 07/04/2021      Assessment & Plan:   Problem List Items Addressed This Visit       Respiratory   Acute bronchitis    Acute bronchitis status post antibiotic therapy with slow resolution on top of seasonal allergies and moderate persistent asthma  Plan to prescribe oral antitussive  No additional antibiotics indicated          Digestive   Oral thrush - Primary    Oral thrush is a side effect from recent antibiotics we will prescribe oral Diflucan for 5 days      Relevant Medications   fluconazole (DIFLUCAN) 100 MG tablet     Endocrine   Type 2 diabetes mellitus without complication, without long-term current use of insulin ([place])    Patient had been given steroids blood sugar had increased told to stay on metformin 500 mg twice daily for now       Meds ordered this encounter  Medications   fluconazole (DIFLUCAN) 100 MG tablet    Sig: Take two once then one daily until gone    Dispense:  7  tablet    Refill:  0   promethazine-dextromethorphan (PROMETHAZINE-DM) 6.25-15 MG/5ML syrup    Sig: Take 5 mLs by mouth 4 (four) times daily as needed for cough.    Dispense:  240 mL    Refill:  0     Follow-up: 1 month   Follow Up Instructions:  Follow-up exam will occur in 1 month face-to-face patient sent to the pharmacy oral antifungal and cough syrup I discussed the assessment and treatment plan with the patient. The patient was provided an opportunity to ask questions and all were answered. The patient agreed with the plan and demonstrated an understanding of the instructions.   The patient was advised to call back or seek an in-person evaluation if the symptoms worsen or if the condition fails to improve as anticipated.  I provided 20 minutes of non-face-to-face time during this encounter  including  median intraservice time , review of notes, labs, imaging, medications  and explaining diagnosis and management to the patient .    Asencion Noble, MD Asencion Noble, MD

## 2021-09-02 ENCOUNTER — Encounter (HOSPITAL_COMMUNITY): Payer: Self-pay | Admitting: Emergency Medicine

## 2021-09-02 ENCOUNTER — Emergency Department (HOSPITAL_COMMUNITY)
Admission: EM | Admit: 2021-09-02 | Discharge: 2021-09-02 | Disposition: A | Discharge status: Home / Self Care | Payer: Medicaid Other | Attending: Emergency Medicine | Admitting: Emergency Medicine

## 2021-09-02 ENCOUNTER — Other Ambulatory Visit: Payer: Self-pay

## 2021-09-02 DIAGNOSIS — F17210 Nicotine dependence, cigarettes, uncomplicated: Secondary | ICD-10-CM | POA: Insufficient documentation

## 2021-09-02 DIAGNOSIS — E119 Type 2 diabetes mellitus without complications: Secondary | ICD-10-CM | POA: Insufficient documentation

## 2021-09-02 DIAGNOSIS — R6884 Jaw pain: Secondary | ICD-10-CM | POA: Insufficient documentation

## 2021-09-02 DIAGNOSIS — Z7982 Long term (current) use of aspirin: Secondary | ICD-10-CM | POA: Insufficient documentation

## 2021-09-02 DIAGNOSIS — K1379 Other lesions of oral mucosa: Secondary | ICD-10-CM

## 2021-09-02 DIAGNOSIS — K146 Glossodynia: Secondary | ICD-10-CM | POA: Insufficient documentation

## 2021-09-02 DIAGNOSIS — J45909 Unspecified asthma, uncomplicated: Secondary | ICD-10-CM | POA: Insufficient documentation

## 2021-09-02 DIAGNOSIS — Z7984 Long term (current) use of oral hypoglycemic drugs: Secondary | ICD-10-CM | POA: Insufficient documentation

## 2021-09-02 DIAGNOSIS — Z7951 Long term (current) use of inhaled steroids: Secondary | ICD-10-CM | POA: Insufficient documentation

## 2021-09-02 DIAGNOSIS — Z955 Presence of coronary angioplasty implant and graft: Secondary | ICD-10-CM | POA: Insufficient documentation

## 2021-09-02 DIAGNOSIS — Z79899 Other long term (current) drug therapy: Secondary | ICD-10-CM | POA: Insufficient documentation

## 2021-09-02 DIAGNOSIS — Z20822 Contact with and (suspected) exposure to covid-19: Secondary | ICD-10-CM | POA: Insufficient documentation

## 2021-09-02 DIAGNOSIS — I10 Essential (primary) hypertension: Secondary | ICD-10-CM | POA: Insufficient documentation

## 2021-09-02 LAB — GROUP A STREP BY PCR: Group A Strep by PCR: NOT DETECTED

## 2021-09-02 LAB — RESP PANEL BY RT-PCR (FLU A&B, COVID) ARPGX2
Influenza A by PCR: NEGATIVE
Influenza B by PCR: NEGATIVE
SARS Coronavirus 2 by RT PCR: NEGATIVE

## 2021-09-02 MED ORDER — CHLORHEXIDINE GLUCONATE 0.12 % MT SOLN
15.0000 mL | Freq: Two times a day (BID) | OROMUCOSAL | 0 refills | Status: DC
  Filled 2021-09-04: qty 473, 16d supply, fill #0

## 2021-09-02 MED ORDER — NYSTATIN 100000 UNIT/ML MT SUSP: 5.0000 mL | Freq: Three times a day (TID) | OROMUCOSAL | 1 refills | Status: DC

## 2021-09-02 MED ORDER — ACETAMINOPHEN 325 MG PO TABS: 650.0000 mg | ORAL_TABLET | Freq: Once | ORAL | Status: DC | PRN

## 2021-09-02 NOTE — ED Provider Notes (Signed)
[facility] DEPT Provider Note   CSN: [id number] Arrival date & time: 09/02/21  1245     History No chief complaint on file.   Sydney Williams is a 55 y.o. female.  HPI Patient is a 55 year old female presented to the ER today with complaints of mouth pain.  It seems that patient was started on prednisone approximately 10 days ago for an asthma exacerbation she states that after this she developed thrush was treated with fluconazole and seems to have some mild improvement in her tongue and mouth pain however she states that she only has a few tablets of fluconazole left to take and feels that her symptoms have not completely resolved.  She states she has some discomfort with swallowing but is able to tolerate food liquids without tremendous difficulty.  States that she has not had any food get stuck in her throat.  She denies any chest abdominal or head pain.  Denies any lightheadedness or dizziness.  No fevers chills or fatigue.  She states that from a respiratory standpoint she feels that she is much improved after the prednisone for her asthma exacerbation.  She states that she is also experiencing dry mouth which she finds uncomfortable.  She denies any other associate symptoms.  No other aggravating mitigating factors.     Past Medical History:  Diagnosis Date   Asthma    2015 hospitaliation for asthma   Diabetes mellitus    Type II   Family history of breast cancer    Fibroids 2010   Genital herpes    Genital herpes 11/30/2014   GERD (gastroesophageal reflux disease)    Hyperlipidemia    Hypertension    Impaired fasting blood sugar    Migraines    Obesity    Right carpal tunnel syndrome    Smoker     Patient Active Problem List   Diagnosis Date Noted   Acute bronchitis 08/29/2021   Oral thrush 08/29/2021   Otomycosis of right ear 07/04/2021   Otitis externa 07/04/2021   Degenerative disc disease, lumbar    History of lumbar  spinal fusion 10/24/2020   Seasonal and perennial allergic rhinoconjunctivitis 09/18/2020   Carpal tunnel syndrome on right    Seronegative rheumatoid arthritis ([place]) 08/04/2020   Allergic conjunctivitis of both eyes 07/19/2020   Lumbar radiculopathy, chronic 04/03/2020   Elevated blood uric acid level 04/03/2020   Family history of cervical cancer 02/02/2018   Family history of breast cancer 02/02/2018   Vitamin D deficiency 02/02/2018   Depression 06/19/2017   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 01/08/2017   Snoring 09/11/2016   History of migraine 09/11/2016   Frequent headaches 09/11/2016   Gastroesophageal reflux disease without esophagitis 06/01/2015   Other allergic rhinitis 06/01/2015   Tobacco user 11/30/2014   Essential hypertension 11/30/2014   Hyperlipidemia 11/30/2014   Asthma, moderate persistent 11/30/2014   Obesity 07/02/2012   Type 2 diabetes mellitus without complication, without long-term current use of insulin ([place]) 07/02/2012    Past Surgical History:  Procedure Laterality Date   BACK SURGERY  10/24/2020   CARDIAC CATHETERIZATION     CARPAL TUNNEL RELEASE Right 08/30/2020   Procedure: RIGHT CARPAL TUNNEL RELEASE;  Surgeon: Leandrew Koyanagi, MD;  Location: [facility];  Service: Orthopedics;  Laterality: Right;   COLONOSCOPY  03/13/2016   Dr. Carlean Purl, normal, repeat 2027   CYST EXCISION     left neck/postauricular region, benign   DILATION AND CURETTAGE OF UTERUS  LAPAROSCOPIC ABDOMINAL EXPLORATION     removal of ectopic preg   LEFT HEART CATH AND CORONARY ANGIOGRAPHY N/A 06/23/2017   Procedure: LEFT HEART CATH AND CORONARY ANGIOGRAPHY;  Surgeon: Nelva Bush, MD;  Location: [facility];  Service: Cardiovascular;  Laterality: N/A;   TUBAL LIGATION       OB History     Gravida  5   Para  1   Term  1   Preterm      AB  2   Living  3      SAB  1   IAB      Ectopic  1   Multiple      Live Births               Family History  Problem Relation Age of Onset   Diabetes Mother    Hypertension Mother    Aneurysm Mother    Stroke Mother    Cancer Mother        cervical cancer   Cancer Maternal Aunt        breast   Cancer Cousin        breast/breast   Lupus Cousin    Heart disease Neg Hx    Colon cancer Neg Hx    Allergic rhinitis Neg Hx    Angioedema Neg Hx    Asthma Neg Hx    Atopy Neg Hx    Eczema Neg Hx    Immunodeficiency Neg Hx    Urticaria Neg Hx     Social History   Tobacco Use   Smoking status: Every Day    Packs/day: 0.25    Years: 14.00    Pack years: 3.50    Types: Cigarettes   Smokeless tobacco: Never  Vaping Use   Vaping Use: Never used  Substance Use Topics   Alcohol use: Yes    Comment: occ   Drug use: No    Home Medications Prior to Admission medications   Medication Sig Start Date End Date Taking? Authorizing Provider  chlorhexidine (PERIDEX) 0.12 % solution Use as directed 15 mLs in the mouth or throat 2 (two) times daily. 09/02/21  Yes Tedd Sias, PA  magic mouthwash (nystatin, diphenhydrAMINE, alum & mag hydroxide) suspension mixture Swish and spit 5 mLs 3 (three) times daily. 09/02/21  Yes Tyrae Alcoser, Ova [place] S, PA  acetaminophen (TYLENOL) 500 MG tablet Take 1,000 mg by mouth every 6 (six) hours as needed for moderate pain.    [provider]  acyclovir (ZOVIRAX) 200 MG capsule TAKE 1 CAPSULE (200 MG TOTAL) BY MOUTH 2 (TWO) TIMES DAILY. 12/19/20 12/19/21  Elsie Stain, MD  albuterol (VENTOLIN HFA) 108 (90 Base) MCG/ACT inhaler INHALE 1-2 PUFFS INTO THE LUNGS EVERY 6 (SIX) HOURS AS NEEDED FOR WHEEZING OR SHORTNESS OF BREATH. 08/31/20   Elsie Stain, MD  allopurinol (ZYLOPRIM) 100 MG tablet TAKE 1 TABLET (100 MG TOTAL) BY MOUTH DAILY. 05/01/21 05/01/22  Elsie Stain, MD  amLODipine (NORVASC) 10 MG tablet TAKE 1 TABLET (10 MG TOTAL) BY MOUTH DAILY. TO LOWER BLOOD PRESSURE 05/01/21 05/01/22  Elsie Stain, MD  ascorbic acid (VITAMIN C)  500 MG tablet Take 500 mg by mouth daily.    [provider]  aspirin (EQ ASPIRIN ADULT LOW DOSE) 81 MG EC tablet Take 1 tablet (81 mg total) by mouth daily. Swallow whole. 02/03/18   Tysinger, Camelia Eng, PA-C  atorvastatin (LIPITOR) 40 MG tablet TAKE 1 TABLET (40 MG  TOTAL) BY MOUTH DAILY. 05/01/21 05/01/22  Elsie Stain, MD  diphenhydrAMINE (BENADRYL) 25 MG tablet Take 1 tablet (25 mg total) by mouth every 6 (six) hours as needed. 01/09/20   Tedd Sias, PA  ferrous sulfate 325 (65 FE) MG tablet Take 325 mg by mouth daily with breakfast.    [provider]  fluconazole (DIFLUCAN) 100 MG tablet Take two once then one daily until gone 08/29/21   Elsie Stain, MD  fluticasone [facility]) 50 MCG/ACT nasal spray Place 1 spray into both nostrils 2 (two) times daily as needed for rhinitis. 05/01/21   Elsie Stain, MD  Fluticasone-Umeclidin-Vilant (TRELEGY ELLIPTA) 200-62.5-25 MCG/INH AEPB Inhale 1 puff into the lungs daily. Rinse mouth after each use. 05/01/21   Elsie Stain, MD  furosemide (LASIX) 20 MG tablet TAKE 1 TABLET (20 MG TOTAL) BY MOUTH DAILY AS NEEDED FOR EDEMA. 06/25/21 06/25/22  Elsie Stain, MD  hydrochlorothiazide (HYDRODIURIL) 25 MG tablet TAKE 1 TABLET (25 MG TOTAL) BY MOUTH DAILY. TO LOWER BLOOD PRESSURE 05/01/21 05/01/22  Elsie Stain, MD  ibuprofen (ADVIL) 600 MG tablet TAKE 1 TABLET (600 MG TOTAL) BY MOUTH EVERY 8 (EIGHT) HOURS AS NEEDED FOR MODERATE PAIN. TAKE AFTER EATING 05/18/21 05/18/22  Elsie Stain, MD  metFORMIN (GLUCOPHAGE) 500 MG tablet TAKE 1 TABLET (500 MG TOTAL) BY MOUTH 2 (TWO) TIMES DAILY WITH A MEAL. 05/01/21 05/01/22  Elsie Stain, MD  metoprolol tartrate (LOPRESSOR) 50 MG tablet Take 1 tablet (50 mg total) by mouth 2 (two) times daily. 05/01/21   Elsie Stain, MD  promethazine-dextromethorphan (PROMETHAZINE-DM) 6.25-15 MG/5ML syrup Take 5 mLs by mouth 4 (four) times daily as needed for cough. 08/29/21   Elsie Stain, MD   Vitamin D, Cholecalciferol, 25 MCG (1000 UT) TABS Take 1 tablet by mouth once daily 02/18/19   Tysinger, Camelia Eng, PA-C    Allergies    Lisinopril, Potassium chloride, Pneumococcal vaccines, and Vicodin [hydrocodone-acetaminophen]  Review of Systems   Review of Systems  Constitutional:  Negative for fever.  HENT:  Negative for congestion.        Tongue pain  Respiratory:  Negative for shortness of breath.   Cardiovascular:  Negative for chest pain.  Gastrointestinal:  Negative for abdominal distention.  Neurological:  Negative for dizziness and headaches.   Physical Exam Updated Vital Signs BP 118/88 (BP Location: Right Arm)   Pulse 85   Temp 98 F (36.7 C) (Oral)   Resp 16   Ht 6' (1.829 m)   Wt 123.8 kg   LMP 06/29/2015 (Exact Date)   SpO2 96%   BMI 37.03 kg/m   Physical Exam Vitals and nursing note reviewed.  Constitutional:      General: She is not in acute distress.    Appearance: Normal appearance. She is not ill-appearing.  HENT:     Head: Normocephalic and atraumatic.     Mouth/Throat:     Mouth: Mucous membranes are moist.     Comments: Tongue is unremarkable.  No ulcers or lesions.  Uvula is midline.  No posterior pharyngeal erythema.  Tonsils normal size.  Normal phonation. Eyes:     General: No scleral icterus.       Right eye: No discharge.        Left eye: No discharge.     Conjunctiva/sclera: Conjunctivae normal.  Pulmonary:     Effort: Pulmonary effort is normal.     Breath sounds: Normal breath sounds. No stridor.  Abdominal:     Tenderness: There is no abdominal tenderness. There is no guarding or rebound.  Neurological:     Mental Status: She is alert and oriented to person, place, and time. Mental status is at baseline.    ED Results / Procedures / Treatments   Labs (all labs ordered are listed, but only abnormal results are displayed) Labs Reviewed  GROUP A STREP BY PCR  RESP PANEL BY RT-PCR (FLU A&B, COVID) [id number]     EKG None  Radiology No results found.  Procedures Procedures   Medications Ordered in ED Medications  acetaminophen (TYLENOL) tablet 650 mg (has no administration in time range)    ED Course  I have reviewed the triage vital signs and the nursing notes.  Pertinent labs & imaging results that were available during my care of the patient were reviewed by me and considered in my medical decision making (see chart for details).    MDM Rules/Calculators/A&P                          Patient is a 55 year old female with past medical history detailed in HPI she is presented to the ER today with complaints of tongue pain and without swelling.  She states is been ongoing for the past few days seems to have become an issue After using prednisone and developing thrush she has been treated for this on my examination I do not see any evidence of Candida and she is not having any pain with swallowing but rather feels she has some difficulty swallowing.  Recommend following up with her primary care provider.  Will prescribe Peridex as it is a sialagogue and also will help with oral hygiene.  Also Magic mouthwash with nystatin swish and spit 3 times daily should help with resolution of thrush.  Also with tongue pain.  Return precautions given.  Patient agreeable to plan.  Strep and COVID/influenza test were obtained prior to my evaluation patient.  These were both negative  Sydney Williams was evaluated in Emergency Department on 09/02/2021 for the symptoms described in the history of present illness. She was evaluated in the context of the global COVID-19 pandemic, which necessitated consideration that the patient might be at risk for infection with the SARS-CoV-2 virus that causes COVID-19. Institutional protocols and algorithms that pertain to the evaluation of patients at risk for COVID-19 are in a state of rapid change based on information released by regulatory bodies including the CDC and  federal and state organizations. These policies and algorithms were followed during the patient's care in the ED.   Final Clinical Impression(s) / ED Diagnoses Final diagnoses:  Mouth pain  Tongue pain    Rx / DC Orders ED Discharge Orders          Ordered    magic mouthwash (nystatin, diphenhydrAMINE, alum & mag hydroxide) suspension mixture  3 times daily        09/02/21 0726    chlorhexidine (PERIDEX) 0.12 % solution  2 times daily        09/02/21 0726             Tedd Sias, PA 09/02/21 0945    Lacretia Leigh, MD 09/04/21 1554

## 2021-09-02 NOTE — Discharge Instructions (Addendum)
Please use the nystatin mouthwash that I prescribed to you. Please follow-up with your primary care doctor. I suspect that your symptoms will improve now that you are off the prednisone.  I recommend using sour candies such as lemon drops or similar to help stimulate saliva secretion.  I have also prescribed you a solution called Peridex that you will use in the morning and at night this will also help saliva production some.  Ultimately you will need primary care follow up.

## 2021-09-03 ENCOUNTER — Other Ambulatory Visit: Payer: Self-pay

## 2021-09-04 ENCOUNTER — Other Ambulatory Visit: Payer: Self-pay

## 2021-09-05 ENCOUNTER — Other Ambulatory Visit: Payer: Self-pay | Admitting: Critical Care Medicine

## 2021-09-05 MED ORDER — IBUPROFEN 600 MG PO TABS
ORAL_TABLET | ORAL | 1 refills | Status: DC
  Filled 2021-09-05: qty 90, 30d supply, fill #0
  Filled 2021-10-24: qty 90, 30d supply, fill #1

## 2021-09-05 NOTE — Telephone Encounter (Signed)
Requested Prescriptions  Pending Prescriptions Disp Refills  . ibuprofen (ADVIL) 600 MG tablet 90 tablet 1    Sig: TAKE 1 TABLET (600 MG TOTAL) BY MOUTH EVERY 8 (EIGHT) HOURS AS NEEDED FOR MODERATE PAIN. TAKE AFTER EATING     Analgesics:  NSAIDS Passed - 09/05/2021  1:12 PM      Passed - Cr in normal range and within 360 days    Creat  Date Value Ref Range Status  08/04/2020 0.52 0.50 - 1.05 mg/dL Final    Comment:    For patients >56 years of age, the reference limit for Creatinine is approximately 13% higher for people identified as African-American. .    Creatinine, Ser  Date Value Ref Range Status  01/30/2021 0.70 0.44 - 1.00 mg/dL Final   Creatinine, Urine  Date Value Ref Range Status  01/08/2017 122 20 - 320 mg/dL Final         Passed - HGB in normal range and within 360 days    Hemoglobin  Date Value Ref Range Status  01/30/2021 14.0 12.0 - 15.0 g/dL Final  06/06/2020 13.7 11.1 - 15.9 g/dL Final         Passed - Patient is not pregnant      Passed - Valid encounter within last 12 months    Recent Outpatient Visits          1 week ago Oral thrush   [place] Elsie Stain, MD   2 months ago Otomycosis of right ear   [place] Elsie Stain, MD   4 months ago Type 2 diabetes mellitus without complication, without long-term current use of insulin [facility])   [place] Elsie Stain, MD   6 months ago Pap smear for cervical cancer screening   [place], MD   8 months ago Type 2 diabetes mellitus without complication, without long-term current use of insulin [facility])   [place], MD      Future Appointments            In 6 days Elsie Stain, MD [place]

## 2021-09-06 ENCOUNTER — Other Ambulatory Visit: Payer: Self-pay

## 2021-09-07 ENCOUNTER — Other Ambulatory Visit: Payer: Self-pay | Admitting: Critical Care Medicine

## 2021-09-07 ENCOUNTER — Other Ambulatory Visit: Payer: Self-pay

## 2021-09-08 MED ORDER — FLUTICASONE-UMECLIDIN-VILANT 200-62.5-25 MCG/ACT IN AEPB
1.0000 | INHALATION_SPRAY | Freq: Every day | RESPIRATORY_TRACT | 0 refills | Status: DC
  Filled 2021-09-08: qty 60, 30d supply, fill #0

## 2021-09-08 NOTE — Telephone Encounter (Signed)
Requested Prescriptions  Pending Prescriptions Disp Refills  . Fluticasone-Umeclidin-Vilant 200-62.5-25 MCG/ACT AEPB 60 each 0    Sig: Inhale 1 puff into the lungs daily. Rinse mouth after each use.     There is no refill protocol information for this order

## 2021-09-10 ENCOUNTER — Other Ambulatory Visit: Payer: Self-pay

## 2021-09-11 ENCOUNTER — Ambulatory Visit: Payer: Self-pay | Attending: Critical Care Medicine | Admitting: Critical Care Medicine

## 2021-09-11 ENCOUNTER — Encounter: Payer: Self-pay | Admitting: Critical Care Medicine

## 2021-09-11 ENCOUNTER — Other Ambulatory Visit: Payer: Self-pay

## 2021-09-11 VITALS — BP 137/92 | HR 93 | Resp 16 | Wt 254.2 lb

## 2021-09-11 DIAGNOSIS — E669 Obesity, unspecified: Secondary | ICD-10-CM

## 2021-09-11 DIAGNOSIS — J209 Acute bronchitis, unspecified: Secondary | ICD-10-CM

## 2021-09-11 DIAGNOSIS — B370 Candidal stomatitis: Secondary | ICD-10-CM

## 2021-09-11 DIAGNOSIS — E119 Type 2 diabetes mellitus without complications: Secondary | ICD-10-CM

## 2021-09-11 DIAGNOSIS — I10 Essential (primary) hypertension: Secondary | ICD-10-CM

## 2021-09-11 DIAGNOSIS — Z720 Tobacco use: Secondary | ICD-10-CM

## 2021-09-11 DIAGNOSIS — E782 Mixed hyperlipidemia: Secondary | ICD-10-CM

## 2021-09-11 DIAGNOSIS — J4540 Moderate persistent asthma, uncomplicated: Secondary | ICD-10-CM

## 2021-09-11 MED ORDER — TRULICITY 0.75 MG/0.5ML ~~LOC~~ SOAJ
0.7500 mg | SUBCUTANEOUS | 4 refills | Status: DC
  Filled 2021-09-11: qty 2, 28d supply, fill #0
  Filled 2021-10-03: qty 2, 28d supply, fill #1
  Filled 2021-10-24: qty 2, 28d supply, fill #2
  Filled 2021-11-28: qty 2, 28d supply, fill #3
  Filled 2021-11-28: qty 2, 28d supply, fill #0

## 2021-09-11 MED ORDER — FLUTICASONE-UMECLIDIN-VILANT 200-62.5-25 MCG/ACT IN AEPB
1.0000 | INHALATION_SPRAY | Freq: Every day | RESPIRATORY_TRACT | 4 refills | Status: DC
  Filled 2021-09-11 (×2): qty 60, 30d supply, fill #0
  Filled 2021-10-12: qty 60, 30d supply, fill #1
  Filled 2021-11-10: qty 60, 30d supply, fill #2
  Filled 2021-11-12: qty 60, 30d supply, fill #0
  Filled 2021-11-14: qty 180, 90d supply, fill #0

## 2021-09-11 MED ORDER — ATORVASTATIN CALCIUM 40 MG PO TABS
ORAL_TABLET | Freq: Every day | ORAL | 2 refills | Status: DC
  Filled 2021-09-11: qty 90, fill #0
  Filled 2021-09-25 – 2021-10-02 (×2): qty 30, 30d supply, fill #0
  Filled 2021-10-28: qty 30, 30d supply, fill #1
  Filled 2021-11-28: qty 30, 30d supply, fill #2
  Filled 2021-11-28: qty 30, 30d supply, fill #0
  Filled 2021-12-31: qty 90, 90d supply, fill #1

## 2021-09-11 MED ORDER — BASAGLAR KWIKPEN 100 UNIT/ML ~~LOC~~ SOPN
10.0000 [IU] | PEN_INJECTOR | Freq: Every day | SUBCUTANEOUS | 4 refills | Status: DC
  Filled 2021-09-11: qty 3, 30d supply, fill #0
  Filled 2021-10-05: qty 3, 30d supply, fill #1

## 2021-09-11 MED ORDER — METOPROLOL TARTRATE 50 MG PO TABS
50.0000 mg | ORAL_TABLET | Freq: Two times a day (BID) | ORAL | 2 refills | Status: DC
  Filled 2021-09-11: qty 60, 30d supply, fill #0
  Filled 2021-10-12: qty 60, 30d supply, fill #1
  Filled 2021-11-10: qty 60, 30d supply, fill #2
  Filled 2021-11-12: qty 60, 30d supply, fill #0

## 2021-09-11 MED ORDER — INSULIN PEN NEEDLE 31G X 8 MM MISC
2 refills | Status: DC
  Filled 2021-09-11: qty 100, 25d supply, fill #0

## 2021-09-11 MED ORDER — NICOTINE POLACRILEX 4 MG MT LOZG: LOZENGE | OROMUCOSAL | 4 refills | Status: DC

## 2021-09-11 MED ORDER — AMLODIPINE BESYLATE 10 MG PO TABS
ORAL_TABLET | Freq: Every day | ORAL | 2 refills | Status: DC
  Filled 2021-09-11: qty 30, 30d supply, fill #0
  Filled 2021-10-07: qty 30, 30d supply, fill #1
  Filled 2021-11-10: qty 30, 30d supply, fill #2
  Filled 2021-11-12: qty 30, 30d supply, fill #0
  Filled 2021-12-13 – 2021-12-20 (×2): qty 30, 30d supply, fill #1
  Filled 2022-01-14: qty 30, 30d supply, fill #2

## 2021-09-11 MED ORDER — HYDROCHLOROTHIAZIDE 25 MG PO TABS
ORAL_TABLET | ORAL | 3 refills | Status: DC
  Filled 2021-09-11: qty 90, fill #0
  Filled 2021-10-07: qty 30, 30d supply, fill #0
  Filled 2021-11-10: qty 30, 30d supply, fill #1
  Filled 2021-11-12: qty 30, 30d supply, fill #0

## 2021-09-11 NOTE — Assessment & Plan Note (Signed)
Moderate persistent asthma continue Trelegy

## 2021-09-11 NOTE — Assessment & Plan Note (Signed)
Poorly controlled diabetes plan to discontinue metformin and begin Basaglar insulin 10 units daily and begin Trulicity 75 mcg weekly  Patient instructed as to proper use of the insulin pen and the Trulicity injection system  Return to see clinical pharmacy in 2 weeks

## 2021-09-11 NOTE — Assessment & Plan Note (Signed)
Dietary changes suggested

## 2021-09-11 NOTE — Assessment & Plan Note (Signed)
Resolved on therapy

## 2021-09-11 NOTE — Assessment & Plan Note (Signed)
Hypertension borderline control  Continue amlodipine and chlorthalidone  Follow-up metabolic panel

## 2021-09-11 NOTE — Patient Instructions (Signed)
Stop metformin  Begin insulin Basaglar 10 units daily  Begin Trulicity 1 injection weekly  Discontinue allopurinol  You do not need any further antifungal treatments of the mouth, use Biotene mouth rinse over-the-counter for dry mouth  Continue your inhalers as you are taking  No change in blood pressure medicine  We recommend you get an eye exam we gave you I resources to go to the [facility]  Stop smoking using nicotine lozenge replacement therapy 4 mg 3 times daily and see attachment  Follow healthy diet as we discussed see attachment  Return to see [facility] clinical pharmacist in 2 weeks  Return to see Dr. Joya Gaskins 2 months

## 2021-09-11 NOTE — Assessment & Plan Note (Signed)
  .   Current smoking consumption amount: 3 to 5 cigarettes a day  . Dicsussion on advise to quit smoking and smoking impacts: Cardiovascular lung impacts  . Patient's willingness to quit: Focused on quitting  . Methods to quit smoking discussed: Behavioral modification and nicotine replacement  Medication management of smoking session drugs discussed: Begin nicotine lozenge 4 mg 3 times daily . Resources provided:  AVS   . Setting quit date not established  . Follow-up arranged 2 months   Time spent counseling the patient: 5 minutes

## 2021-09-11 NOTE — Progress Notes (Signed)
Patient states that her blood sugar has not went down and that she is still having a cough with a dry throat.

## 2021-09-11 NOTE — Assessment & Plan Note (Signed)
No need for additional antibiotics

## 2021-09-11 NOTE — Progress Notes (Signed)
Established Patient Office Visit  Subjective:  Patient ID: Sydney Williams, female    DOB: [birth year]  Age: 55 y.o. MRN: [id number]  CC:  Chief Complaint  Patient presents with   Hospitalization Follow-up    HPI Sydney Williams presents for post hospital follow-up.  Patient was in the emergency room a week ago for dry mouth.  Her oral candidiasis had resolved itself on exam.  Patient still having increasing blood sugars greater than 400 despite being on metformin.  Patient states she has excess urination at this time.  Creatinine was not significantly elevated during the ER visit.  Patient is due a mammogram and eye exam.  She has allergies to Pneumovax so cannot take a pneumonia vaccine.  She is already had her flu vaccine this season.  Patient still has a cough that is minimally productive at this time.  Shortness of breath is at baseline.  She needs refills on multiple medications.  Patient still smoking 3 cigarettes daily.  She would like to quit. Past Medical History:  Diagnosis Date   Asthma    2015 hospitaliation for asthma   Diabetes mellitus    Type II   Family history of breast cancer    Fibroids 2010   Genital herpes    Genital herpes 11/30/2014   GERD (gastroesophageal reflux disease)    Hyperlipidemia    Hypertension    Impaired fasting blood sugar    Migraines    Obesity    Right carpal tunnel syndrome    Smoker     Past Surgical History:  Procedure Laterality Date   BACK SURGERY  10/24/2020   CARDIAC CATHETERIZATION     CARPAL TUNNEL RELEASE Right 08/30/2020   Procedure: RIGHT CARPAL TUNNEL RELEASE;  Surgeon: Leandrew Koyanagi, MD;  Location: [facility];  Service: Orthopedics;  Laterality: Right;   COLONOSCOPY  03/13/2016   Dr. Carlean Purl, normal, repeat 2027   CYST EXCISION     left neck/postauricular region, benign   DILATION AND CURETTAGE OF UTERUS     LAPAROSCOPIC ABDOMINAL EXPLORATION     removal of ectopic preg   LEFT HEART CATH AND CORONARY  ANGIOGRAPHY N/A 06/23/2017   Procedure: LEFT HEART CATH AND CORONARY ANGIOGRAPHY;  Surgeon: Nelva Bush, MD;  Location: [facility];  Service: Cardiovascular;  Laterality: N/A;   TUBAL LIGATION      Family History  Problem Relation Age of Onset   Diabetes Mother    Hypertension Mother    Aneurysm Mother    Stroke Mother    Cancer Mother        cervical cancer   Cancer Maternal Aunt        breast   Cancer Cousin        breast/breast   Lupus Cousin    Heart disease Neg Hx    Colon cancer Neg Hx    Allergic rhinitis Neg Hx    Angioedema Neg Hx    Asthma Neg Hx    Atopy Neg Hx    Eczema Neg Hx    Immunodeficiency Neg Hx    Urticaria Neg Hx     Social History   Socioeconomic History   Marital status: Legally Separated    Spouse name: Not on file   Number of children: Not on file   Years of education: Not on file   Highest education level: Not on file  Occupational History   Not on file  Tobacco Use   Smoking  status: Every Day    Packs/day: 0.25    Years: 14.00    Pack years: 3.50    Types: Cigarettes   Smokeless tobacco: Never  Vaping Use   Vaping Use: Never used  Substance and Sexual Activity   Alcohol use: Yes    Comment: occ   Drug use: No   Sexual activity: Yes    Birth control/protection: Surgical  Other Topics Concern   Not on file  Social History Narrative   Married, and her granddaughter lives with her, works as Quarry manager at [employer], exercise - activity at work, walking.  12/2016   Social Determinants of Health   Financial Resource Strain: Not on file  Food Insecurity: Not on file  Transportation Needs: Not on file  Physical Activity: Not on file  Stress: Not on file  Social Connections: Not on file  Intimate Partner Violence: Not on file    Outpatient Medications Prior to Visit  Medication Sig Dispense Refill   acetaminophen (TYLENOL) 500 MG tablet Take 1,000 mg by mouth every 6 (six) hours as needed for moderate pain.      acyclovir (ZOVIRAX) 200 MG capsule TAKE 1 CAPSULE (200 MG TOTAL) BY MOUTH 2 (TWO) TIMES DAILY. 180 capsule 3   albuterol (VENTOLIN HFA) 108 (90 Base) MCG/ACT inhaler INHALE 1-2 PUFFS INTO THE LUNGS EVERY 6 (SIX) HOURS AS NEEDED FOR WHEEZING OR SHORTNESS OF BREATH. 18 g 0   ascorbic acid (VITAMIN C) 500 MG tablet Take 500 mg by mouth daily.     aspirin (EQ ASPIRIN ADULT LOW DOSE) 81 MG EC tablet Take 1 tablet (81 mg total) by mouth daily. Swallow whole. 90 tablet 3   ferrous sulfate 325 (65 FE) MG tablet Take 325 mg by mouth daily with breakfast.     furosemide (LASIX) 20 MG tablet TAKE 1 TABLET (20 MG TOTAL) BY MOUTH DAILY AS NEEDED FOR EDEMA. 30 tablet 1   ibuprofen (ADVIL) 600 MG tablet TAKE 1 TABLET (600 MG TOTAL) BY MOUTH EVERY 8 (EIGHT) HOURS AS NEEDED FOR MODERATE PAIN. TAKE AFTER EATING 90 tablet 1   magic mouthwash (nystatin, diphenhydrAMINE, alum & mag hydroxide) suspension mixture Swish and spit 5 mLs 3 (three) times daily. 240 mL 1   promethazine-dextromethorphan (PROMETHAZINE-DM) 6.25-15 MG/5ML syrup Take 5 mLs by mouth 4 (four) times daily as needed for cough. 240 mL 0   Vitamin D, Cholecalciferol, 25 MCG (1000 UT) TABS Take 1 tablet by mouth once daily 90 tablet 0   allopurinol (ZYLOPRIM) 100 MG tablet TAKE 1 TABLET (100 MG TOTAL) BY MOUTH DAILY. 90 tablet 2   amLODipine (NORVASC) 10 MG tablet TAKE 1 TABLET (10 MG TOTAL) BY MOUTH DAILY. TO LOWER BLOOD PRESSURE 90 tablet 2   atorvastatin (LIPITOR) 40 MG tablet TAKE 1 TABLET (40 MG TOTAL) BY MOUTH DAILY. 90 tablet 2   chlorhexidine (PERIDEX) 0.12 % solution Use as directed 15 mLs in the mouth or throat 2 (two) times daily. 473 mL 0   diphenhydrAMINE (BENADRYL) 25 MG tablet Take 1 tablet (25 mg total) by mouth every 6 (six) hours as needed. 30 tablet 0   Fluticasone-Umeclidin-Vilant 200-62.5-25 MCG/ACT AEPB Inhale 1 puff into the lungs daily. Rinse mouth after each use. 60 each 0   hydrochlorothiazide (HYDRODIURIL) 25 MG tablet TAKE 1  TABLET (25 MG TOTAL) BY MOUTH DAILY. TO LOWER BLOOD PRESSURE 90 tablet 3   metFORMIN (GLUCOPHAGE) 500 MG tablet TAKE 1 TABLET (500 MG TOTAL) BY MOUTH 2 (TWO) TIMES DAILY  WITH A MEAL. 90 tablet 3   metoprolol tartrate (LOPRESSOR) 50 MG tablet Take 1 tablet (50 mg total) by mouth 2 (two) times daily. 60 tablet 2   fluconazole (DIFLUCAN) 100 MG tablet Take two once then one daily until gone (Patient not taking: Reported on 09/11/2021) 7 tablet 0   fluticasone (FLONASE) 50 MCG/ACT nasal spray Place 1 spray into both nostrils 2 (two) times daily as needed for rhinitis. (Patient not taking: Reported on 09/11/2021) 16 g 5   No facility-administered medications prior to visit.    Allergies  Allergen Reactions   Lisinopril Swelling   Potassium Chloride Shortness Of Breath and Swelling    Tolerates IV KCl, reaction only to PO product   Pneumococcal Vaccines Swelling and Other (See Comments)    Reaction:  Swelling at injection site   Vicodin [Hydrocodone-Acetaminophen] Itching and Rash    ROS Review of Systems  Constitutional: Negative.   HENT: Negative.  Negative for ear pain, postnasal drip, rhinorrhea, sinus pressure, sore throat, trouble swallowing and voice change.        Dry mouth  Eyes: Negative.   Respiratory:  Positive for cough. Negative for apnea, choking, chest tightness, shortness of breath, wheezing and stridor.   Cardiovascular: Negative.  Negative for chest pain, palpitations and leg swelling.  Gastrointestinal: Negative.  Negative for abdominal distention, abdominal pain, nausea and vomiting.  Endocrine: Positive for polyuria.  Genitourinary: Negative.   Musculoskeletal: Negative.  Negative for arthralgias and myalgias.  Skin: Negative.  Negative for rash.  Allergic/Immunologic: Negative.  Negative for environmental allergies and food allergies.  Neurological: Negative.  Negative for dizziness, syncope, weakness and headaches.  Hematological: Negative.  Negative for  adenopathy. Does not bruise/bleed easily.  Psychiatric/Behavioral: Negative.  Negative for agitation and sleep disturbance. The patient is not nervous/anxious.      Objective:    Physical Exam Vitals reviewed.  Constitutional:      Appearance: Normal appearance. She is well-developed. She is obese. She is not diaphoretic.  HENT:     Head: Normocephalic and atraumatic.     Nose: No nasal deformity, septal deviation, mucosal edema or rhinorrhea.     Right Sinus: No maxillary sinus tenderness or frontal sinus tenderness.     Left Sinus: No maxillary sinus tenderness or frontal sinus tenderness.     Mouth/Throat:     Mouth: Mucous membranes are dry.     Pharynx: No oropharyngeal exudate.     Comments: No yeast seen Eyes:     General: No scleral icterus.    Conjunctiva/sclera: Conjunctivae normal.     Pupils: Pupils are equal, round, and reactive to light.  Neck:     Thyroid: No thyromegaly.     Vascular: No carotid bruit or JVD.     Trachea: Trachea normal. No tracheal tenderness or tracheal deviation.  Cardiovascular:     Rate and Rhythm: Normal rate and regular rhythm.     Chest Wall: PMI is not displaced.     Pulses: Normal pulses. No decreased pulses.     Heart sounds: Normal heart sounds, S1 normal and S2 normal. Heart sounds not distant. No murmur heard. No systolic murmur is present.  No diastolic murmur is present.    No friction rub. No gallop. No S3 or S4 sounds.  Pulmonary:     Effort: Pulmonary effort is normal. No tachypnea, accessory muscle usage or respiratory distress.     Breath sounds: Normal breath sounds. No stridor. No decreased breath sounds, wheezing, rhonchi  or rales.  Chest:     Chest wall: No tenderness.  Abdominal:     General: Bowel sounds are normal. There is no distension.     Palpations: Abdomen is soft. Abdomen is not rigid.     Tenderness: There is no abdominal tenderness. There is no guarding or rebound.  Musculoskeletal:        General:  Normal range of motion.     Cervical back: Normal range of motion and neck supple. No edema, erythema or rigidity. No muscular tenderness. Normal range of motion.  Lymphadenopathy:     Head:     Right side of head: No submental or submandibular adenopathy.     Left side of head: No submental or submandibular adenopathy.     Cervical: No cervical adenopathy.  Skin:    General: Skin is warm and dry.     Coloration: Skin is not pale.     Findings: No rash.     Nails: There is no clubbing.  Neurological:     Mental Status: She is alert and oriented to person, place, and time.     Sensory: No sensory deficit.  Psychiatric:        Mood and Affect: Mood normal.        Speech: Speech normal.        Behavior: Behavior normal.        Thought Content: Thought content normal.        Judgment: Judgment normal.    BP (!) 137/92   Pulse 93   Resp 16   Wt 254 lb 3.2 oz (115.3 kg)   LMP 06/29/2015 (Exact Date)   SpO2 92%   BMI 34.48 kg/m  Wt Readings from Last 3 Encounters:  09/11/21 254 lb 3.2 oz (115.3 kg)  09/02/21 273 lb (123.8 kg)  07/04/21 265 lb 6.4 oz (120.4 kg)     Health Maintenance Due  Topic Date Due   Zoster Vaccines- Shingrix (1 of 2) Never done   OPHTHALMOLOGY EXAM  02/03/2019   MAMMOGRAM  02/12/2020   COVID-19 Vaccine (4 - Booster for Pfizer series) 01/16/2021    There are no preventive care reminders to display for this patient.  Lab Results  Component Value Date   TSH 1.21 09/11/2016   Lab Results  Component Value Date   WBC 8.6 01/30/2021   HGB 14.0 01/30/2021   HCT 43.7 01/30/2021   MCV 91.8 01/30/2021   PLT 289 01/30/2021   Lab Results  Component Value Date   NA 143 01/30/2021   K 3.1 (L) 01/30/2021   CO2 30 01/30/2021   GLUCOSE 98 01/30/2021   BUN 12 01/30/2021   CREATININE 0.70 01/30/2021   BILITOT 0.6 10/18/2020   ALKPHOS 97 10/18/2020   AST 28 10/18/2020   ALT 27 10/18/2020   PROT 7.7 10/18/2020   ALBUMIN 3.7 10/18/2020   CALCIUM 9.3  01/30/2021   ANIONGAP 12 01/30/2021   Lab Results  Component Value Date   CHOL 147 06/06/2020   Lab Results  Component Value Date   HDL 35 (L) 06/06/2020   Lab Results  Component Value Date   LDLCALC 68 06/06/2020   Lab Results  Component Value Date   TRIG 273 (H) 06/06/2020   Lab Results  Component Value Date   CHOLHDL 4.2 06/06/2020   Lab Results  Component Value Date   HGBA1C 7.0 07/04/2021      Assessment & Plan:   Problem List Items Addressed This Visit  Cardiovascular and Mediastinum   Essential hypertension    Hypertension borderline control  Continue amlodipine and chlorthalidone  Follow-up metabolic panel      Relevant Medications   amLODipine (NORVASC) 10 MG tablet   atorvastatin (LIPITOR) 40 MG tablet   hydrochlorothiazide (HYDRODIURIL) 25 MG tablet   metoprolol tartrate (LOPRESSOR) 50 MG tablet   Other Relevant Orders   Comprehensive metabolic panel     Respiratory   Asthma, moderate persistent    Moderate persistent asthma continue Trelegy      Relevant Medications   Fluticasone-Umeclidin-Vilant 200-62.5-25 MCG/ACT AEPB   RESOLVED: Acute bronchitis    No need for additional antibiotics        Digestive   RESOLVED: Oral thrush    Resolved on therapy        Endocrine   Type 2 diabetes mellitus without complication, without long-term current use of insulin (HCC) - Primary    Poorly controlled diabetes plan to discontinue metformin and begin Basaglar insulin 10 units daily and begin Trulicity 75 mcg weekly  Patient instructed as to proper use of the insulin pen and the Trulicity injection system  Return to see clinical pharmacy in 2 weeks      Relevant Medications   Insulin Glargine (BASAGLAR KWIKPEN) 100 UNIT/ML   Dulaglutide (TRULICITY) 5.17 OH/6.0VP SOPN   atorvastatin (LIPITOR) 40 MG tablet   Other Relevant Orders   Lipid panel     Other   Obesity    Dietary changes suggested      Relevant Medications    Insulin Glargine (BASAGLAR KWIKPEN) 100 UNIT/ML   Dulaglutide (TRULICITY) 7.10 GY/6.9SW SOPN   Tobacco user       Current smoking consumption amount: 3 to 5 cigarettes a day  Dicsussion on advise to quit smoking and smoking impacts: Cardiovascular lung impacts  Patient's willingness to quit: Focused on quitting  Methods to quit smoking discussed: Behavioral modification and nicotine replacement  Medication management of smoking session drugs discussed: Begin nicotine lozenge 4 mg 3 times daily Resources provided:  AVS   Setting quit date not established  Follow-up arranged 2 months   Time spent counseling the patient: 5 minutes        Hyperlipidemia   Relevant Medications   amLODipine (NORVASC) 10 MG tablet   atorvastatin (LIPITOR) 40 MG tablet   hydrochlorothiazide (HYDRODIURIL) 25 MG tablet   metoprolol tartrate (LOPRESSOR) 50 MG tablet   Other Relevant Orders   Lipid panel    Meds ordered this encounter  Medications   Insulin Glargine (BASAGLAR KWIKPEN) 100 UNIT/ML    Sig: Inject 10 Units into the skin daily.    Dispense:  12 mL    Refill:  4   Insulin Pen Needle 31G X 8 MM MISC    Sig: Use as directed with insulin    Dispense:  50 each    Refill:  2   Dulaglutide (TRULICITY) 5.46 EV/0.3JK SOPN    Sig: Inject 0.75 mg into the skin once a week.    Dispense:  2 mL    Refill:  4   amLODipine (NORVASC) 10 MG tablet    Sig: TAKE 1 TABLET (10 MG TOTAL) BY MOUTH DAILY. TO LOWER BLOOD PRESSURE    Dispense:  90 tablet    Refill:  2   atorvastatin (LIPITOR) 40 MG tablet    Sig: TAKE 1 TABLET (40 MG TOTAL) BY MOUTH DAILY.    Dispense:  90 tablet    Refill:  2  Fluticasone-Umeclidin-Vilant 200-62.5-25 MCG/ACT AEPB    Sig: Inhale 1 puff into the lungs daily. Rinse mouth after each use.    Dispense:  60 each    Refill:  4    For future refills   hydrochlorothiazide (HYDRODIURIL) 25 MG tablet    Sig: TAKE 1 TABLET (25 MG TOTAL) BY MOUTH DAILY. TO LOWER BLOOD  PRESSURE    Dispense:  90 tablet    Refill:  3   metoprolol tartrate (LOPRESSOR) 50 MG tablet    Sig: Take 1 tablet (50 mg total) by mouth 2 (two) times daily.    Dispense:  60 tablet    Refill:  2   nicotine polacrilex (NICORETTE MINI) 4 MG lozenge    Sig: Use 3 times daily to stop smoking    Dispense:  100 tablet    Refill:  4    Follow-up: Return in about 2 months (around 11/11/2021).    Asencion Noble, MD

## 2021-09-12 ENCOUNTER — Other Ambulatory Visit: Payer: Self-pay

## 2021-09-13 MED FILL — Acyclovir Cap 200 MG: ORAL | 30 days supply | Qty: 60 | Fill #3 | Status: AC

## 2021-09-14 ENCOUNTER — Other Ambulatory Visit: Payer: Self-pay

## 2021-09-15 ENCOUNTER — Encounter: Payer: Self-pay | Admitting: Nurse Practitioner

## 2021-09-15 NOTE — Progress Notes (Unsigned)
Received call from after hours RN. Sydney Williams states her blood glucose levels have been increasing along with urinary frequency. Reports readings as high as 500 today.  She is asymptomatic. Had oxtails, gravy and ice cream today. Currently administering Trulicity 0.16 mg weekly and basaglar 10 units daily. At this time I have instructed her to take her metformin 500 mg this evening. She will resume her metformin as previously prescribed until follow up with PCP and next A1c. If begins to have glucose levels consistently <90 she will decrease metformin to 500 mg daily.

## 2021-09-25 ENCOUNTER — Other Ambulatory Visit: Payer: Self-pay

## 2021-10-02 ENCOUNTER — Other Ambulatory Visit: Payer: Self-pay

## 2021-10-03 ENCOUNTER — Other Ambulatory Visit: Payer: Self-pay

## 2021-10-05 ENCOUNTER — Other Ambulatory Visit: Payer: Self-pay

## 2021-10-08 ENCOUNTER — Other Ambulatory Visit: Payer: Self-pay

## 2021-10-12 ENCOUNTER — Other Ambulatory Visit: Payer: Self-pay

## 2021-10-12 MED FILL — Acyclovir Cap 200 MG: ORAL | 30 days supply | Qty: 60 | Fill #4 | Status: AC

## 2021-10-15 ENCOUNTER — Other Ambulatory Visit: Payer: Self-pay

## 2021-10-17 ENCOUNTER — Other Ambulatory Visit: Payer: Self-pay

## 2021-10-18 ENCOUNTER — Other Ambulatory Visit: Payer: Self-pay

## 2021-10-18 ENCOUNTER — Ambulatory Visit: Payer: Medicaid Other | Attending: Physician Assistant | Admitting: Physician Assistant

## 2021-10-18 VITALS — BP 114/77 | HR 78 | Ht 71.0 in | Wt 240.4 lb

## 2021-10-18 DIAGNOSIS — Z1239 Encounter for other screening for malignant neoplasm of breast: Secondary | ICD-10-CM

## 2021-10-18 DIAGNOSIS — R634 Abnormal weight loss: Secondary | ICD-10-CM

## 2021-10-18 DIAGNOSIS — E119 Type 2 diabetes mellitus without complications: Secondary | ICD-10-CM

## 2021-10-18 DIAGNOSIS — I10 Essential (primary) hypertension: Secondary | ICD-10-CM

## 2021-10-18 DIAGNOSIS — R6881 Early satiety: Secondary | ICD-10-CM

## 2021-10-18 LAB — GLUCOSE, POCT (MANUAL RESULT ENTRY): POC Glucose: 144 mg/dl — AB (ref 70–99)

## 2021-10-18 NOTE — Progress Notes (Signed)
Pt complains of dry mouth, loosing weight and feeling tired for about 2 months.

## 2021-10-18 NOTE — Progress Notes (Signed)
Patient ID: Sydney Williams, female   DOB: 06/26/1966, 55 y.o.   MRN: [id number]   Sydney Williams, is a 55 y.o. female  [id number]  [id number]  DOB - August 16, 1966  Chief Complaint  Patient presents with   Hypertension       Subjective:   Sydney Williams is a 55 y.o. female here today for early satiety and unexplained weight loss.  Blood sugars running around 250.  33 pound unintentional weight loss since 09/02/2021 documented in Epic  Poor appetite.  Does not want to eat.   She is concerned-FH + thyroid disease in mat aunt, female organ CA in mom; mat aunt with breast CA.  Recent pap was normal.   She is overdue for MMG.  Colonoscopy done at age 12 and normal.  Recent CXR was normal.    No abdominal pain.  No change in BM.  No melena or hematochezia.  No N/V/D/C.      Filed Weights   10/18/21 1349  Weight: 240 lb 6.4 oz (109 kg)  11/15=254 pounds 11/6=273 03/20/2021=277   No problems updated.  ALLERGIES: Allergies  Allergen Reactions   Lisinopril Swelling   Potassium Chloride Shortness Of Breath and Swelling    Tolerates IV KCl, reaction only to PO product   Pneumococcal Vaccines Swelling and Other (See Comments)    Reaction:  Swelling at injection site   Vicodin [Hydrocodone-Acetaminophen] Itching and Rash    PAST MEDICAL HISTORY: Past Medical History:  Diagnosis Date   Asthma    2015 hospitaliation for asthma   Diabetes mellitus    Type II   Family history of breast cancer    Fibroids 2010   Genital herpes    Genital herpes 11/30/2014   GERD (gastroesophageal reflux disease)    Hyperlipidemia    Hypertension    Impaired fasting blood sugar    Migraines    Obesity    Right carpal tunnel syndrome    Smoker     MEDICATIONS AT HOME: Prior to Admission medications   Medication Sig Start Date End Date Taking? Authorizing Provider  acetaminophen (TYLENOL) 500 MG tablet Take 1,000 mg by mouth every 6 (six) hours as needed for moderate pain.   Yes [provider]  albuterol (VENTOLIN HFA) 108 (90 Base) MCG/ACT inhaler INHALE 1-2 PUFFS INTO THE LUNGS EVERY 6 (SIX) HOURS AS NEEDED FOR WHEEZING OR SHORTNESS OF BREATH. 08/31/20  Yes Elsie Stain, MD  amLODipine (NORVASC) 10 MG tablet TAKE 1 TABLET (10 MG TOTAL) BY MOUTH DAILY. TO LOWER BLOOD PRESSURE 09/11/21 09/11/22 Yes Elsie Stain, MD  aspirin (EQ ASPIRIN ADULT LOW DOSE) 81 MG EC tablet Take 1 tablet (81 mg total) by mouth daily. Swallow whole. 02/03/18  Yes Tysinger, Camelia Eng, PA-C  atorvastatin (LIPITOR) 40 MG tablet TAKE 1 TABLET (40 MG TOTAL) BY MOUTH DAILY. 09/11/21 09/11/22 Yes Elsie Stain, MD  Dulaglutide (TRULICITY) 2.29 NL/8.9QJ SOPN Inject 0.75 mg into the skin once a week. 09/11/21  Yes Elsie Stain, MD  Fluticasone-Umeclidin-Vilant 200-62.5-25 MCG/ACT AEPB Inhale 1 puff into the lungs daily. Rinse mouth after each use. 09/11/21  Yes Elsie Stain, MD  furosemide (LASIX) 20 MG tablet TAKE 1 TABLET (20 MG TOTAL) BY MOUTH DAILY AS NEEDED FOR EDEMA. 06/25/21 06/25/22 Yes Elsie Stain, MD  hydrochlorothiazide (HYDRODIURIL) 25 MG tablet TAKE 1 TABLET (25 MG TOTAL) BY MOUTH DAILY. TO LOWER BLOOD PRESSURE 09/11/21 09/11/22 Yes Elsie Stain, MD  ibuprofen (ADVIL) 600 MG tablet  TAKE 1 TABLET (600 MG TOTAL) BY MOUTH EVERY 8 (EIGHT) HOURS AS NEEDED FOR MODERATE PAIN. TAKE AFTER EATING 09/05/21 09/05/22 Yes Elsie Stain, MD  Insulin Glargine [facility]) 100 UNIT/ML Inject 10 Units into the skin daily. 09/11/21  Yes Elsie Stain, MD  Insulin Pen Needle 31G X 8 MM MISC Use as directed with insulin 09/11/21  Yes Elsie Stain, MD  metoprolol tartrate (LOPRESSOR) 50 MG tablet Take 1 tablet (50 mg total) by mouth 2 (two) times daily. 09/11/21  Yes Elsie Stain, MD  Vitamin D, Cholecalciferol, 25 MCG (1000 UT) TABS Take 1 tablet by mouth once daily 02/18/19  Yes Tysinger, Camelia Eng, PA-C  acyclovir (ZOVIRAX) 200 MG capsule TAKE 1 CAPSULE (200 MG TOTAL)  BY MOUTH 2 (TWO) TIMES DAILY. Patient not taking: Reported on 10/18/2021 12/19/20 12/19/21  Elsie Stain, MD  ascorbic acid (VITAMIN C) 500 MG tablet Take 500 mg by mouth daily. Patient not taking: Reported on 10/18/2021    [provider]  ferrous sulfate 325 (65 FE) MG tablet Take 325 mg by mouth daily with breakfast. Patient not taking: Reported on 10/18/2021    [provider]  magic mouthwash (nystatin, diphenhydrAMINE, alum & mag hydroxide) suspension mixture Swish and spit 5 mLs 3 (three) times daily. Patient not taking: Reported on 10/18/2021 09/02/21   Tedd Sias, PA  nicotine polacrilex (NICORETTE MINI) 4 MG lozenge Use 3 times daily to stop smoking Patient not taking: Reported on 10/18/2021 09/11/21   Elsie Stain, MD  promethazine-dextromethorphan (PROMETHAZINE-DM) 6.25-15 MG/5ML syrup Take 5 mLs by mouth 4 (four) times daily as needed for cough. Patient not taking: Reported on 10/18/2021 08/29/21   Elsie Stain, MD    ROS: Neg HEENT Neg resp Neg cardiac Neg GU Neg MS Neg psych Neg neuro  Objective:   Vitals:   10/18/21 1349  BP: 114/77  Pulse: 78  SpO2: 97%  Weight: 240 lb 6.4 oz (109 kg)  Height: 5\' 11"  (1.803 m)   Exam General appearance : Awake, alert, not in any distress. Speech Clear. Not toxic looking;  tearful HEENT: Atraumatic and Normocephalic Neck: Supple, no JVD. No cervical lymphadenopathy.  Chest: Good air entry bilaterally, CTAB.  No rales/rhonchi/wheezing CVS: S1 S2 regular, no murmurs.  Abdomen: Bowel sounds present, Non tender and not distended with no gaurding, rigidity or rebound. Extremities: B/L Lower Ext shows no edema, both legs are warm to touch Neurology: Awake alert, and oriented X 3, CN II-XII intact, Non focal Skin: No Rash  Data Review Lab Results  Component Value Date   HGBA1C 7.0 07/04/2021   HGBA1C 7.3 (A) 03/20/2021   HGBA1C 6.3 (A) 12/19/2020    Assessment & Plan   1. Type 2  diabetes mellitus without complication, without long-term current use of insulin (HCC) Doubt controlled-will adjust meds as needed pending A1C - Glucose (CBG) - Hemoglobin A1c  2. Essential hypertension controlled - Comprehensive metabolic panel  3. Early satiety R/o pathologic causes - Thyroid Panel With TSH - Lipase - CT Abdomen Pelvis Wo Contrast; Future  4. Weight loss - Thyroid Panel With TSH - Lipase - CT Abdomen Pelvis Wo Contrast; Future  5. Encounter for screening for malignant neoplasm of breast, unspecified screening modality - MM Digital Screening; Future    Patient have been counseled extensively about nutrition and exercise. Other issues discussed during this visit include: low cholesterol diet, weight control and daily exercise, foot care, annual eye examinations at Ophthalmology,  importance of adherence with medications and regular follow-up. We also discussed long term complications of uncontrolled diabetes and hypertension.   Return in about 6 weeks (around 11/29/2021) for with PCP for chronic conditions.  The patient was given clear instructions to go to ER or return to medical center if symptoms don't improve, worsen or new problems develop. The patient verbalized understanding. The patient was told to call to get lab results if they haven't heard anything in the next week.      Freeman Caldron, PA-C [facility], [place]   10/18/2021, 2:13 PM

## 2021-10-19 ENCOUNTER — Other Ambulatory Visit: Payer: Self-pay | Admitting: Physician Assistant

## 2021-10-19 DIAGNOSIS — E119 Type 2 diabetes mellitus without complications: Secondary | ICD-10-CM

## 2021-10-19 DIAGNOSIS — E876 Hypokalemia: Secondary | ICD-10-CM

## 2021-10-19 LAB — COMPREHENSIVE METABOLIC PANEL
ALT: 19 IU/L (ref 0–32)
AST: 26 IU/L (ref 0–40)
Albumin/Globulin Ratio: 1.2 (ref 1.2–2.2)
Albumin: 4.1 g/dL (ref 3.8–4.9)
Alkaline Phosphatase: 97 IU/L (ref 44–121)
BUN/Creatinine Ratio: 10 (ref 9–23)
BUN: 6 mg/dL (ref 6–24)
Bilirubin Total: 0.4 mg/dL (ref 0.0–1.2)
CO2: 31 mmol/L — ABNORMAL HIGH (ref 20–29)
Calcium: 9.3 mg/dL (ref 8.7–10.2)
Chloride: 90 mmol/L — ABNORMAL LOW (ref 96–106)
Creatinine, Ser: 0.62 mg/dL (ref 0.57–1.00)
Globulin, Total: 3.4 g/dL (ref 1.5–4.5)
Glucose: 120 mg/dL — ABNORMAL HIGH (ref 70–99)
Potassium: 2.8 mmol/L — ABNORMAL LOW (ref 3.5–5.2)
Sodium: 142 mmol/L (ref 134–144)
Total Protein: 7.5 g/dL (ref 6.0–8.5)
eGFR: 104 mL/min/{1.73_m2} (ref >59)

## 2021-10-19 LAB — HEMOGLOBIN A1C
Est. average glucose Bld gHb Est-mCnc: 306 mg/dL
Hgb A1c MFr Bld: 12.3 % — ABNORMAL HIGH (ref 4.8–5.6)

## 2021-10-19 LAB — THYROID PANEL WITH TSH
Free Thyroxine Index: 2.3 (ref 1.2–4.9)
T3 Uptake Ratio: 28 % (ref 24–39)
T4, Total: 8.1 ug/dL (ref 4.5–12.0)
TSH: 0.829 u[IU]/mL (ref 0.450–4.500)

## 2021-10-19 LAB — LIPASE: Lipase: 27 U/L (ref 14–72)

## 2021-10-19 MED ORDER — BASAGLAR KWIKPEN 100 UNIT/ML ~~LOC~~ SOPN
14.0000 [IU] | PEN_INJECTOR | Freq: Every day | SUBCUTANEOUS | 4 refills | Status: DC
  Filled 2021-10-19: qty 12, 85d supply, fill #0
  Filled 2021-10-29: qty 6, 42d supply, fill #0

## 2021-10-19 MED ORDER — METFORMIN HCL 500 MG PO TABS
1000.0000 mg | ORAL_TABLET | Freq: Two times a day (BID) | ORAL | 1 refills | Status: DC
Start: 2021-10-19 — End: 2022-05-16
  Filled 2021-10-19 – 2021-11-28 (×2): qty 120, 30d supply, fill #0
  Filled 2021-11-28 – 2022-01-04 (×2): qty 120, 30d supply, fill #1
  Filled 2022-02-19 – 2022-02-20 (×2): qty 120, 30d supply, fill #2
  Filled 2022-04-10: qty 120, 30d supply, fill #3

## 2021-10-23 ENCOUNTER — Other Ambulatory Visit: Payer: Self-pay

## 2021-10-24 ENCOUNTER — Other Ambulatory Visit: Payer: Self-pay

## 2021-10-25 ENCOUNTER — Other Ambulatory Visit: Payer: Self-pay

## 2021-10-29 ENCOUNTER — Other Ambulatory Visit: Payer: Self-pay

## 2021-10-30 ENCOUNTER — Ambulatory Visit (HOSPITAL_COMMUNITY)
Admission: RE | Admit: 2021-10-30 | Discharge: 2021-10-30 | Disposition: A | Discharge status: Home / Self Care | Payer: Medicaid Other | Source: Ambulatory Visit | Attending: Physician Assistant | Admitting: Physician Assistant

## 2021-10-30 DIAGNOSIS — R634 Abnormal weight loss: Secondary | ICD-10-CM | POA: Insufficient documentation

## 2021-10-30 DIAGNOSIS — R6881 Early satiety: Secondary | ICD-10-CM | POA: Insufficient documentation

## 2021-11-10 MED FILL — Acyclovir Cap 200 MG: ORAL | 30 days supply | Qty: 60 | Fill #5 | Status: CN

## 2021-11-12 ENCOUNTER — Other Ambulatory Visit: Payer: Self-pay

## 2021-11-12 MED FILL — Acyclovir Cap 200 MG: ORAL | 30 days supply | Qty: 60 | Fill #0 | Status: AC

## 2021-11-14 ENCOUNTER — Other Ambulatory Visit: Payer: Self-pay

## 2021-11-15 ENCOUNTER — Encounter: Payer: Self-pay | Admitting: Critical Care Medicine

## 2021-11-15 ENCOUNTER — Other Ambulatory Visit: Payer: Self-pay

## 2021-11-15 ENCOUNTER — Ambulatory Visit: Payer: Self-pay | Attending: Critical Care Medicine | Admitting: Critical Care Medicine

## 2021-11-15 VITALS — BP 127/86 | HR 75 | Resp 16 | Wt 243.6 lb

## 2021-11-15 DIAGNOSIS — Z720 Tobacco use: Secondary | ICD-10-CM

## 2021-11-15 DIAGNOSIS — E782 Mixed hyperlipidemia: Secondary | ICD-10-CM

## 2021-11-15 DIAGNOSIS — E119 Type 2 diabetes mellitus without complications: Secondary | ICD-10-CM

## 2021-11-15 DIAGNOSIS — I700 Atherosclerosis of aorta: Secondary | ICD-10-CM | POA: Insufficient documentation

## 2021-11-15 DIAGNOSIS — E876 Hypokalemia: Secondary | ICD-10-CM

## 2021-11-15 DIAGNOSIS — Z6833 Body mass index (BMI) 33.0-33.9, adult: Secondary | ICD-10-CM

## 2021-11-15 DIAGNOSIS — I10 Essential (primary) hypertension: Secondary | ICD-10-CM

## 2021-11-15 DIAGNOSIS — E669 Obesity, unspecified: Secondary | ICD-10-CM

## 2021-11-15 LAB — GLUCOSE, POCT (MANUAL RESULT ENTRY): POC Glucose: 85 mg/dl (ref 70–99)

## 2021-11-15 NOTE — Progress Notes (Signed)
Established Patient Office Visit  Subjective:  Patient ID: Sydney Williams, female    DOB: [birth year]  Age: 56 y.o. MRN: [id number]  CC:  Chief Complaint  Patient presents with   Diabetes    HPI LADA FULBRIGHT presents for follow up for diabetes. She was last seen on 10/18/2021 by Freeman Caldron PA-C. At that time she had been experiencing unintentional weight loss and loss of appetite. A CT was ordered and completed on 10/30/2021.There were no acute findings, but fatty liver, aortic atherosclerosis, and myomatous uterus were noted. Labs were also ordered at that time. The only abnormalities were HbA1C of 12.2% and hypokalemia. She was informed about the hypokalemia, and has been eating bananas to try to increase her potassium levels since she cannot tolerate oral potassium.   Today, she states that she is no longer experiencing the loss of appetite and unexplained weight loss. She believes her symptoms were most likely related to her diabetes not being well-controlled. She is now using insulin, in addition to her Trulicity and Metformin. She states her blood sugars have been much better lately. She brought her home glucose monitor to the appointment today. The readings ranged from 81-128 mg/dL. She would like to eventually be able to stop using the insulin.  [name] expresses renewed commitment to lifestyle modifications to manage her conditions. She has made changes to her diet. She eats 3 meals per day, and drinks water. She will typically eat a boiled egg  and whole grain toast for breakfast, a salad for lunch, and varied meals for dinner. She always tries to eat vegetables with dinner. She has been looking for lower sodium options at the grocery store. She drinks water. She cooks for herself, her husband, and her granddaughter, and expresses that she wants to feed her granddaughter healthy foods as well. She is interested in starting an exercise routine.   The patient is also committed to  smoking cessation. She had been down to 3 cigarettes per day, but her use increased slightly to 4-5 cigarettes per day due to the stress she was experiencing surrounding her health. Nicotine lozenges were ordered at the last visit, but these were unavailable at the pharmacy. She still is interested in using the lozenges.   Ms. Sydney Williams is up to date with health screenings. She has a mammogram scheduled next month. She saw her eye doctor on 10/26/21, and was told she has no evidence of diabetic eye disease. She saw her dentist about two weeks ago. She is up to date on her vaccinations.   Past Medical History:  Diagnosis Date   Asthma    2015 hospitaliation for asthma   Diabetes mellitus    Type II   Family history of breast cancer    Fibroids 2010   Genital herpes    Genital herpes 11/30/2014   GERD (gastroesophageal reflux disease)    Hyperlipidemia    Hypertension    Impaired fasting blood sugar    Migraines    Obesity    Right carpal tunnel syndrome    Smoker     Past Surgical History:  Procedure Laterality Date   BACK SURGERY  10/24/2020   CARDIAC CATHETERIZATION     CARPAL TUNNEL RELEASE Right 08/30/2020   Procedure: RIGHT CARPAL TUNNEL RELEASE;  Surgeon: Leandrew Koyanagi, MD;  Location: [facility];  Service: Orthopedics;  Laterality: Right;   COLONOSCOPY  03/13/2016   Dr. Carlean Purl, normal, repeat 2027   CYST EXCISION  left neck/postauricular region, benign   DILATION AND CURETTAGE OF UTERUS     LAPAROSCOPIC ABDOMINAL EXPLORATION     removal of ectopic preg   LEFT HEART CATH AND CORONARY ANGIOGRAPHY N/A 06/23/2017   Procedure: LEFT HEART CATH AND CORONARY ANGIOGRAPHY;  Surgeon: Nelva Bush, MD;  Location: [facility];  Service: Cardiovascular;  Laterality: N/A;   TUBAL LIGATION      Family History  Problem Relation Age of Onset   Diabetes Mother    Hypertension Mother    Aneurysm Mother    Stroke Mother    Cancer Mother        cervical cancer    Cancer Maternal Aunt        breast   Cancer Cousin        breast/breast   Lupus Cousin    Heart disease Neg Hx    Colon cancer Neg Hx    Allergic rhinitis Neg Hx    Angioedema Neg Hx    Asthma Neg Hx    Atopy Neg Hx    Eczema Neg Hx    Immunodeficiency Neg Hx    Urticaria Neg Hx     Social History   Socioeconomic History   Marital status: Legally Separated    Spouse name: Not on file   Number of children: Not on file   Years of education: Not on file   Highest education level: Not on file  Occupational History   Not on file  Tobacco Use   Smoking status: Every Day    Packs/day: 0.25    Years: 14.00    Pack years: 3.50    Types: Cigarettes   Smokeless tobacco: Never  Vaping Use   Vaping Use: Never used  Substance and Sexual Activity   Alcohol use: Yes    Comment: occ   Drug use: No   Sexual activity: Yes    Birth control/protection: Surgical  Other Topics Concern   Not on file  Social History Narrative   Married, and her granddaughter lives with her, works as Quarry manager at [employer], exercise - activity at work, walking.  12/2016   Social Determinants of Health   Financial Resource Strain: Not on file  Food Insecurity: Not on file  Transportation Needs: Not on file  Physical Activity: Not on file  Stress: Not on file  Social Connections: Not on file  Intimate Partner Violence: Not on file    Outpatient Medications Prior to Visit  Medication Sig Dispense Refill   acetaminophen (TYLENOL) 500 MG tablet Take 1,000 mg by mouth every 6 (six) hours as needed for moderate pain.     acyclovir (ZOVIRAX) 200 MG capsule TAKE 1 CAPSULE (200 MG TOTAL) BY MOUTH 2 (TWO) TIMES DAILY. 180 capsule 3   albuterol (VENTOLIN HFA) 108 (90 Base) MCG/ACT inhaler INHALE 1-2 PUFFS INTO THE LUNGS EVERY 6 (SIX) HOURS AS NEEDED FOR WHEEZING OR SHORTNESS OF BREATH. 18 g 0   amLODipine (NORVASC) 10 MG tablet TAKE 1 TABLET (10 MG TOTAL) BY MOUTH DAILY. TO LOWER BLOOD PRESSURE 90 tablet 2    ascorbic acid (VITAMIN C) 500 MG tablet Take 500 mg by mouth daily.     aspirin (EQ ASPIRIN ADULT LOW DOSE) 81 MG EC tablet Take 1 tablet (81 mg total) by mouth daily. Swallow whole. 90 tablet 3   atorvastatin (LIPITOR) 40 MG tablet TAKE 1 TABLET (40 MG TOTAL) BY MOUTH DAILY. 90 tablet 2   Dulaglutide (TRULICITY) 1.32 GM/0.1UU SOPN Inject 0.75  mg into the skin once a week. 2 mL 4   ferrous sulfate 325 (65 FE) MG tablet Take 325 mg by mouth daily with breakfast.     Fluticasone-Umeclidin-Vilant 200-62.5-25 MCG/ACT AEPB Inhale 1 puff into the lungs daily. Rinse mouth after each use. 60 each 4   furosemide (LASIX) 20 MG tablet TAKE 1 TABLET (20 MG TOTAL) BY MOUTH DAILY AS NEEDED FOR EDEMA. 30 tablet 1   hydrochlorothiazide (HYDRODIURIL) 25 MG tablet TAKE 1 TABLET (25 MG TOTAL) BY MOUTH DAILY. TO LOWER BLOOD PRESSURE 90 tablet 3   ibuprofen (ADVIL) 600 MG tablet TAKE 1 TABLET (600 MG TOTAL) BY MOUTH EVERY 8 (EIGHT) HOURS AS NEEDED FOR MODERATE PAIN. TAKE AFTER EATING 90 tablet 1   Insulin Glargine (BASAGLAR KWIKPEN) 100 UNIT/ML Inject 14 Units into the skin daily. 12 mL 4   Insulin Pen Needle 31G X 8 MM MISC Use as directed with insulin 50 each 2   metFORMIN (GLUCOPHAGE) 500 MG tablet Take 2 tablets (1,000 mg total) by mouth 2 (two) times daily with a meal. 360 tablet 1   metoprolol tartrate (LOPRESSOR) 50 MG tablet Take 1 tablet (50 mg total) by mouth 2 (two) times daily. 60 tablet 2   nicotine polacrilex (NICORETTE MINI) 4 MG lozenge Use 3 times daily to stop smoking 100 tablet 4   Vitamin D, Cholecalciferol, 25 MCG (1000 UT) TABS Take 1 tablet by mouth once daily 90 tablet 0   magic mouthwash (nystatin, diphenhydrAMINE, alum & mag hydroxide) suspension mixture Swish and spit 5 mLs 3 (three) times daily. (Patient not taking: Reported on 11/15/2021) 240 mL 1   promethazine-dextromethorphan (PROMETHAZINE-DM) 6.25-15 MG/5ML syrup Take 5 mLs by mouth 4 (four) times daily as needed for cough. (Patient not  taking: Reported on 11/15/2021) 240 mL 0   No facility-administered medications prior to visit.    Allergies  Allergen Reactions   Lisinopril Swelling   Potassium Chloride Shortness Of Breath and Swelling    Tolerates IV KCl, reaction only to PO product   Pneumococcal Vaccines Swelling and Other (See Comments)    Reaction:  Swelling at injection site   Vicodin [Hydrocodone-Acetaminophen] Itching and Rash    ROS Review of Systems  Constitutional:  Negative for fatigue and unexpected weight change.     Objective:    Physical Exam  BP 127/86    Pulse 75    Resp 16    Wt 243 lb 9.6 oz (110.5 kg)    LMP 06/29/2015 (Exact Date)    SpO2 97%    BMI 33.98 kg/m  Wt Readings from Last 3 Encounters:  11/15/21 243 lb 9.6 oz (110.5 kg)  10/18/21 240 lb 6.4 oz (109 kg)  09/11/21 254 lb 3.2 oz (115.3 kg)     Health Maintenance Due  Topic Date Due   Zoster Vaccines- Shingrix (1 of 2) Never done   MAMMOGRAM  02/12/2020   COVID-19 Vaccine (4 - Booster for Pfizer series) 01/16/2021    There are no preventive care reminders to display for this patient.  Lab Results  Component Value Date   TSH 0.829 10/18/2021   Lab Results  Component Value Date   WBC 8.6 01/30/2021   HGB 14.0 01/30/2021   HCT 43.7 01/30/2021   MCV 91.8 01/30/2021   PLT 289 01/30/2021   Lab Results  Component Value Date   NA 146 (H) 11/15/2021   K 3.2 (L) 11/15/2021   CO2 30 (H) 11/15/2021   GLUCOSE 85 11/15/2021  BUN 6 11/15/2021   CREATININE 0.71 11/15/2021   BILITOT 0.4 10/18/2021   ALKPHOS 97 10/18/2021   AST 26 10/18/2021   ALT 19 10/18/2021   PROT 7.5 10/18/2021   ALBUMIN 4.1 10/18/2021   CALCIUM 9.3 11/15/2021   ANIONGAP 12 01/30/2021   EGFR 100 11/15/2021   Lab Results  Component Value Date   CHOL 147 06/06/2020   Lab Results  Component Value Date   HDL 35 (L) 06/06/2020   Lab Results  Component Value Date   LDLCALC 68 06/06/2020   Lab Results  Component Value Date   TRIG 273  (H) 06/06/2020   Lab Results  Component Value Date   CHOLHDL 4.2 06/06/2020   Lab Results  Component Value Date   HGBA1C 9.7 (H) 11/15/2021      Assessment & Plan:   Problem List Items Addressed This Visit       Cardiovascular and Mediastinum   Essential hypertension    Blood pressure is at goal this visit. Continue with amlodipine and hydrochlorothiazide.       Aortic atherosclerosis ([place])    Patient educated on aortic atherosclerosis. Will recheck lipid panel when patient is fasting. Continue on atorvastatin.      Relevant Orders   Lipid panel     Endocrine   Type 2 diabetes mellitus without complication, without long-term current use of insulin ([place]) - Primary    Patient's blood sugars have improved significantly with addition of insulin and dietary changes. Continue with lifestyle management as discussed. Dietary changes include increasing servings of fruits and vegetables, avoiding processed foods, and implementing exercise. HbA1C to be drawn in lab today. Pending results, can discuss discontinuing insulin at future date.       Relevant Orders   POCT glucose (manual entry) (Completed)   Hemoglobin A1c (Completed)   Lipid panel     Other   Obesity    Lifestyle modifications discussed.       Tobacco user    Current smoking consumption is 4-5 cigarettes per day. Counseled on smoking cessation. Rx for nicotine lozenges sent to pharmacy, but patient notified that these are available OTC. Discussed positive cardiovascular effects of smoking cessation. The patient plans to use the lozenges when she craves cigarettes.      Hyperlipidemia   Relevant Orders   Lipid panel   Hypokalemia    Recheck potassium with BMP today.      Relevant Orders   Basic Metabolic Panel (Completed)    No orders of the defined types were placed in this encounter.   Follow-up: Return in about 2 months (around 01/13/2022).    Asencion Noble, MD

## 2021-11-15 NOTE — Assessment & Plan Note (Addendum)
Patient's blood sugars have improved significantly with addition of insulin and dietary changes. Continue with lifestyle management as discussed. Dietary changes include increasing servings of fruits and vegetables, avoiding processed foods, and implementing exercise. HbA1C to be drawn in lab today. Pending results, can discuss discontinuing insulin at future date.

## 2021-11-15 NOTE — Assessment & Plan Note (Signed)
Recheck potassium with BMP today.

## 2021-11-15 NOTE — Patient Instructions (Addendum)
You will need to get nicotine lozenges at [facility]  4mg  three times a day to stop smoking  Labs today metabolic panel, N6F  Return 2-3 weeks for fasting lipid panel  Get your mammogram done Jan 27  No medication changes for now, will call you results  Return 2 months Dr Joya Gaskins

## 2021-11-15 NOTE — Assessment & Plan Note (Signed)
Blood pressure is at goal this visit. Continue with amlodipine and hydrochlorothiazide.

## 2021-11-15 NOTE — Assessment & Plan Note (Signed)
Current smoking consumption is 4-5 cigarettes per day. Counseled on smoking cessation. Rx for nicotine lozenges sent to pharmacy, but patient notified that these are available OTC. Discussed positive cardiovascular effects of smoking cessation. The patient plans to use the lozenges when she craves cigarettes.

## 2021-11-15 NOTE — Assessment & Plan Note (Signed)
Lifestyle modifications discussed.

## 2021-11-15 NOTE — Assessment & Plan Note (Signed)
Patient educated on aortic atherosclerosis. Will recheck lipid panel when patient is fasting. Continue on atorvastatin.

## 2021-11-16 ENCOUNTER — Other Ambulatory Visit: Payer: Self-pay | Admitting: Critical Care Medicine

## 2021-11-16 ENCOUNTER — Ambulatory Visit: Payer: Self-pay | Attending: Critical Care Medicine

## 2021-11-16 ENCOUNTER — Other Ambulatory Visit: Payer: Self-pay

## 2021-11-16 DIAGNOSIS — E782 Mixed hyperlipidemia: Secondary | ICD-10-CM

## 2021-11-16 DIAGNOSIS — E119 Type 2 diabetes mellitus without complications: Secondary | ICD-10-CM

## 2021-11-16 DIAGNOSIS — I700 Atherosclerosis of aorta: Secondary | ICD-10-CM

## 2021-11-16 LAB — BASIC METABOLIC PANEL
BUN/Creatinine Ratio: 8 — ABNORMAL LOW (ref 9–23)
BUN: 6 mg/dL (ref 6–24)
CO2: 30 mmol/L — ABNORMAL HIGH (ref 20–29)
Calcium: 9.3 mg/dL (ref 8.7–10.2)
Chloride: 98 mmol/L (ref 96–106)
Creatinine, Ser: 0.71 mg/dL (ref 0.57–1.00)
Glucose: 85 mg/dL (ref 70–99)
Potassium: 3.2 mmol/L — ABNORMAL LOW (ref 3.5–5.2)
Sodium: 146 mmol/L — ABNORMAL HIGH (ref 134–144)
eGFR: 100 mL/min/{1.73_m2} (ref >59)

## 2021-11-16 LAB — HEMOGLOBIN A1C
Est. average glucose Bld gHb Est-mCnc: 232 mg/dL
Hgb A1c MFr Bld: 9.7 % — ABNORMAL HIGH (ref 4.8–5.6)

## 2021-11-16 MED ORDER — CHLORTHALIDONE 15 MG PO TABS
15.0000 mg | ORAL_TABLET | Freq: Every day | ORAL | 1 refills | Status: DC
  Filled 2021-11-16: qty 30, 30d supply, fill #0
  Filled 2021-12-13: qty 30, 30d supply, fill #1
  Filled 2022-01-14: qty 30, 30d supply, fill #2
  Filled 2022-02-13: qty 30, 30d supply, fill #3
  Filled 2022-03-20: qty 30, 30d supply, fill #4
  Filled 2022-04-22: qty 30, 30d supply, fill #5

## 2021-11-17 LAB — LIPID PANEL
Chol/HDL Ratio: 3.9 ratio (ref 0.0–4.4)
Cholesterol, Total: 140 mg/dL (ref 100–199)
HDL: 36 mg/dL — ABNORMAL LOW (ref >39)
LDL Chol Calc (NIH): 77 mg/dL (ref 0–99)
Triglycerides: 155 mg/dL — ABNORMAL HIGH (ref 0–149)
VLDL Cholesterol Cal: 27 mg/dL (ref 5–40)

## 2021-11-19 ENCOUNTER — Telehealth: Payer: Self-pay

## 2021-11-19 ENCOUNTER — Other Ambulatory Visit: Payer: Self-pay

## 2021-11-19 NOTE — Telephone Encounter (Signed)
-----   Message from Elsie Stain, MD sent at 11/18/2021  7:32 AM EST ----- Let pt know fasting cholesterol normal , stay on current atorvastatin dosing

## 2021-11-19 NOTE — Telephone Encounter (Signed)
Pt was called and is aware of results, DOB was confirmed.  ?

## 2021-11-21 ENCOUNTER — Encounter (HOSPITAL_COMMUNITY): Payer: Self-pay | Admitting: Emergency Medicine

## 2021-11-21 ENCOUNTER — Emergency Department (HOSPITAL_COMMUNITY): Payer: Medicaid Other

## 2021-11-21 ENCOUNTER — Emergency Department (HOSPITAL_COMMUNITY)
Admission: EM | Admit: 2021-11-21 | Discharge: 2021-11-21 | Disposition: A | Discharge status: Home / Self Care | Payer: Medicaid Other | Attending: Emergency Medicine | Admitting: Emergency Medicine

## 2021-11-21 ENCOUNTER — Other Ambulatory Visit: Payer: Self-pay

## 2021-11-21 DIAGNOSIS — E119 Type 2 diabetes mellitus without complications: Secondary | ICD-10-CM | POA: Insufficient documentation

## 2021-11-21 DIAGNOSIS — Z7984 Long term (current) use of oral hypoglycemic drugs: Secondary | ICD-10-CM | POA: Insufficient documentation

## 2021-11-21 DIAGNOSIS — Z20822 Contact with and (suspected) exposure to covid-19: Secondary | ICD-10-CM | POA: Insufficient documentation

## 2021-11-21 DIAGNOSIS — J069 Acute upper respiratory infection, unspecified: Secondary | ICD-10-CM | POA: Insufficient documentation

## 2021-11-21 DIAGNOSIS — Z794 Long term (current) use of insulin: Secondary | ICD-10-CM | POA: Insufficient documentation

## 2021-11-21 DIAGNOSIS — I10 Essential (primary) hypertension: Secondary | ICD-10-CM | POA: Insufficient documentation

## 2021-11-21 DIAGNOSIS — J45909 Unspecified asthma, uncomplicated: Secondary | ICD-10-CM | POA: Insufficient documentation

## 2021-11-21 DIAGNOSIS — Z79899 Other long term (current) drug therapy: Secondary | ICD-10-CM | POA: Insufficient documentation

## 2021-11-21 DIAGNOSIS — Z7982 Long term (current) use of aspirin: Secondary | ICD-10-CM | POA: Insufficient documentation

## 2021-11-21 LAB — RESP PANEL BY RT-PCR (FLU A&B, COVID) ARPGX2
Influenza A by PCR: NEGATIVE
Influenza B by PCR: NEGATIVE
SARS Coronavirus 2 by RT PCR: NEGATIVE

## 2021-11-21 MED ORDER — PREDNISONE 20 MG PO TABS
60.0000 mg | ORAL_TABLET | Freq: Once | ORAL | Status: AC
  Administered 2021-11-21: 21:00:00 60 mg via ORAL
  Filled 2021-11-21: qty 3

## 2021-11-21 MED ORDER — BENZONATATE 100 MG PO CAPS
100.0000 mg | ORAL_CAPSULE | Freq: Three times a day (TID) | ORAL | 0 refills | Status: AC
  Filled 2021-11-21: qty 15, 5d supply, fill #0

## 2021-11-21 MED ORDER — IPRATROPIUM-ALBUTEROL 0.5-2.5 (3) MG/3ML IN SOLN
3.0000 mL | Freq: Once | RESPIRATORY_TRACT | Status: AC
  Administered 2021-11-21: 21:00:00 3 mL via RESPIRATORY_TRACT
  Filled 2021-11-21: qty 3

## 2021-11-21 MED ORDER — DOXYCYCLINE HYCLATE 100 MG PO TABS
100.0000 mg | ORAL_TABLET | Freq: Two times a day (BID) | ORAL | 0 refills | Status: AC
  Filled 2021-11-21: qty 14, 7d supply, fill #0

## 2021-11-21 MED ORDER — PREDNISONE 20 MG PO TABS
40.0000 mg | ORAL_TABLET | Freq: Every day | ORAL | 0 refills | Status: AC
  Filled 2021-11-21: qty 8, 4d supply, fill #0

## 2021-11-21 MED ORDER — IPRATROPIUM BROMIDE 0.02 % IN SOLN
0.5000 mg | Freq: Once | RESPIRATORY_TRACT | Status: AC
  Administered 2021-11-21: 22:00:00 0.5 mg via RESPIRATORY_TRACT
  Filled 2021-11-21: qty 2.5

## 2021-11-21 MED ORDER — ALBUTEROL SULFATE (2.5 MG/3ML) 0.083% IN NEBU
5.0000 mg | INHALATION_SOLUTION | Freq: Once | RESPIRATORY_TRACT | Status: AC
  Administered 2021-11-21: 22:00:00 5 mg via RESPIRATORY_TRACT
  Filled 2021-11-21 (×2): qty 6

## 2021-11-21 NOTE — ED Provider Triage Note (Signed)
Emergency Medicine Provider Triage Evaluation Note  Sydney Williams , a 56 y.o. female  was evaluated in triage.  Pt complains of nasal congestion and cough, onset several days ago. Coughing is getting worse. History of asthma. Has been using her inhaler at home with minimal relief.  Review of Systems  Positive: Cough, nasal congestion Negative: Fever  Physical Exam  BP (!) 175/96 (BP Location: Right Arm)    Pulse (!) 111    Temp 98.4 F (36.9 C) (Oral)    Resp 14    Ht 5\' 11"  (1.803 m)    Wt 110.2 kg    LMP 06/29/2015 (Exact Date)    SpO2 92%    BMI 33.89 kg/m  Gen:   Awake, no distress   Resp:  Normal effort, no wheezing noted MSK:   Moves extremities without difficulty  Other:    Medical Decision Making  Medically screening exam initiated at 6:55 PM.  Appropriate orders placed.  Sydney Williams was informed that the remainder of the evaluation will be completed by another provider, this initial triage assessment does not replace that evaluation, and the importance of remaining in the ED until their evaluation is complete.     Etta Quill, NP 11/21/21 1900

## 2021-11-21 NOTE — Discharge Instructions (Addendum)
Take the antibiotics and steroids as directed.  Please monitor your sugars closely as the steroids can cause her sugars to be elevated.  If they were extremely elevated at home you need to call your primary care provider or if you are having any significant new or worsening symptoms and you need to return to the emergency department.  I do recommend that you follow-up with your regular doctor in the next week for reassessment.

## 2021-11-21 NOTE — ED Notes (Signed)
Pt in imaging

## 2021-11-21 NOTE — ED Triage Notes (Signed)
Patient reports waking up yesterday morning with an asthma exacerbation. Took some Robitussin with honey and tylenol. Started chest tightness this morning. Dry cough.

## 2021-11-21 NOTE — ED Provider Notes (Signed)
Sydney Williams   CSN: [id number] Arrival date & time: 11/21/21  1818     History  Chief Complaint  Patient presents with   Asthma    Sydney Williams is a 56 y.o. female.  HPI   56 y/o female with a h/o asthma, diabetes, genital herpes, GERD, hyperlipidemia, hypertension, obesity, who presents to the ED today for eval of URI symptoms.  States she started having a cough yesterday which is worsened since onset.  Cough is nonproductive.  She denies any chest pain.  For the last 4 to 5 days she has had rhinorrhea, nasal congestion and also had a scratchy throat.  She denies any fevers or chills but has had increased fatigue.  Home Medications Prior to Admission medications   Medication Sig Start Date End Date Taking? Authorizing Provider  benzonatate (TESSALON) 100 MG capsule Take 1 capsule (100 mg total) by mouth every 8 (eight) hours for 5 days. 11/21/21 11/26/21 Yes Kaylene Dawn S, PA-C  doxycycline (VIBRAMYCIN) 100 MG capsule Take 1 capsule (100 mg total) by mouth 2 (two) times daily for 7 days. 11/21/21 11/28/21 Yes Mikalyn Hermida S, PA-C  predniSONE (DELTASONE) 20 MG tablet Take 2 tablets (40 mg total) by mouth daily for 4 days. 11/21/21 11/25/21 Yes Keleigh Kazee S, PA-C  acetaminophen (TYLENOL) 500 MG tablet Take 1,000 mg by mouth every 6 (six) hours as needed for moderate pain.    [provider]  acyclovir (ZOVIRAX) 200 MG capsule TAKE 1 CAPSULE (200 MG TOTAL) BY MOUTH 2 (TWO) TIMES DAILY. 12/19/20 12/19/21  Elsie Stain, MD  albuterol (VENTOLIN HFA) 108 (90 Base) MCG/ACT inhaler INHALE 1-2 PUFFS INTO THE LUNGS EVERY 6 (SIX) HOURS AS NEEDED FOR WHEEZING OR SHORTNESS OF BREATH. 08/31/20   Elsie Stain, MD  amLODipine (NORVASC) 10 MG tablet TAKE 1 TABLET (10 MG TOTAL) BY MOUTH DAILY. TO LOWER BLOOD PRESSURE 09/11/21 09/11/22  Elsie Stain, MD  ascorbic acid (VITAMIN C) 500 MG tablet Take 500 mg by mouth daily.     [provider]  aspirin (EQ ASPIRIN ADULT LOW DOSE) 81 MG EC tablet Take 1 tablet (81 mg total) by mouth daily. Swallow whole. 02/03/18   Tysinger, Camelia Eng, PA-C  atorvastatin (LIPITOR) 40 MG tablet TAKE 1 TABLET (40 MG TOTAL) BY MOUTH DAILY. 09/11/21 09/11/22  Elsie Stain, MD  chlorthalidone (HYGROTEN) 15 MG tablet Take 1 tablet (15 mg total) by mouth daily. 11/16/21   Elsie Stain, MD  Dulaglutide (TRULICITY) 5.85 ID/7.8EU SOPN Inject 0.75 mg into the skin once a week. 09/11/21   Elsie Stain, MD  ferrous sulfate 325 (65 FE) MG tablet Take 325 mg by mouth daily with breakfast.    [provider]  Fluticasone-Umeclidin-Vilant 200-62.5-25 MCG/ACT AEPB Inhale 1 puff into the lungs daily. Rinse mouth after each use. 09/11/21   Elsie Stain, MD  furosemide (LASIX) 20 MG tablet TAKE 1 TABLET (20 MG TOTAL) BY MOUTH DAILY AS NEEDED FOR EDEMA. 06/25/21 06/25/22  Elsie Stain, MD  ibuprofen (ADVIL) 600 MG tablet TAKE 1 TABLET (600 MG TOTAL) BY MOUTH EVERY 8 (EIGHT) HOURS AS NEEDED FOR MODERATE PAIN. TAKE AFTER EATING 09/05/21 09/05/22  Elsie Stain, MD  Insulin Glargine [facility] KWIKPEN) 100 UNIT/ML Inject 14 Units into the skin daily. 10/19/21   Argentina Donovan, PA-C  Insulin Pen Needle 31G X 8 MM MISC Use as directed with insulin 09/11/21   Elsie Stain,  MD  magic mouthwash (nystatin, diphenhydrAMINE, alum & mag hydroxide) suspension mixture Swish and spit 5 mLs 3 (three) times daily. Patient not taking: Reported on 11/15/2021 09/02/21   Tedd Sias, PA  metFORMIN (GLUCOPHAGE) 500 MG tablet Take 2 tablets (1,000 mg total) by mouth 2 (two) times daily with a meal. 10/19/21   Argentina Donovan, PA-C  metoprolol tartrate (LOPRESSOR) 50 MG tablet Take 1 tablet (50 mg total) by mouth 2 (two) times daily. 09/11/21   Elsie Stain, MD  nicotine polacrilex (NICORETTE MINI) 4 MG lozenge Use 3 times daily to stop smoking 09/11/21   Elsie Stain, MD   promethazine-dextromethorphan (PROMETHAZINE-DM) 6.25-15 MG/5ML syrup Take 5 mLs by mouth 4 (four) times daily as needed for cough. Patient not taking: Reported on 11/15/2021 08/29/21   Elsie Stain, MD  Vitamin D, Cholecalciferol, 25 MCG (1000 UT) TABS Take 1 tablet by mouth once daily 02/18/19   Tysinger, Camelia Eng, PA-C      Allergies    Lisinopril, Potassium chloride, Pneumococcal vaccines, and Vicodin [hydrocodone-acetaminophen]    Review of Systems   Review of Systems See HPI for pertinent positives or negatives.   Physical Exam Updated Vital Signs BP (!) 144/90    Pulse (!) 106    Temp 98.4 F (36.9 C) (Oral)    Resp 18    Ht 5\' 11"  (1.803 m)    Wt 110.2 kg    LMP 06/29/2015 (Exact Date)    SpO2 93%    BMI 33.89 kg/m  Physical Exam Vitals and nursing Williams reviewed.  Constitutional:      General: She is not in acute distress.    Appearance: She is well-developed.  HENT:     Head: Normocephalic and atraumatic.     Nose:     Right Turbinates: Enlarged.     Left Turbinates: Enlarged.     Mouth/Throat:     Comments: Mild ttp to the bilat maxillary sinuses Eyes:     Conjunctiva/sclera: Conjunctivae normal.  Cardiovascular:     Rate and Rhythm: Normal rate and regular rhythm.     Heart sounds: Normal heart sounds.  Pulmonary:     Effort: Pulmonary effort is normal.     Breath sounds: No stridor. Rales (left lower lobe) present. No wheezing.  Musculoskeletal:        General: Normal range of motion.     Cervical back: Neck supple.  Skin:    General: Skin is warm and dry.  Neurological:     Mental Status: She is alert.    ED Results / Procedures / Treatments   Labs (all labs ordered are listed, but only abnormal results are displayed) Labs Reviewed  RESP PANEL BY RT-PCR (FLU A&B, COVID) [id number]    EKG None  Radiology DG Chest 2 View  Result Date: 11/21/2021 CLINICAL DATA:  Asthma exacerbation EXAM: CHEST - 2 VIEW COMPARISON:  08/22/2021 FINDINGS: Cardiac and  mediastinal contours are within normal limits. No focal pulmonary opacity. No pleural effusion or pneumothorax. No acute osseous abnormality. IMPRESSION: No acute cardiopulmonary process. Electronically Signed   By: Merilyn Baba M.D.   On: 11/21/2021 19:20    Procedures Procedures     Medications Ordered in ED Medications  ipratropium-albuterol (DUONEB) 0.5-2.5 (3) MG/3ML nebulizer solution 3 mL (3 mLs Nebulization Given 11/21/21 2122)  predniSONE (DELTASONE) tablet 60 mg (60 mg Oral Given 11/21/21 2122)  albuterol (PROVENTIL) (2.5 MG/3ML) 0.083% nebulizer solution 5 mg (5 mg Nebulization Given 11/21/21  2143)  ipratropium (ATROVENT) nebulizer solution 0.5 mg (0.5 mg Nebulization Given 11/21/21 2143)    ED Course/ Medical Decision Making/ A&P                           Medical Decision Making Amount and/or Complexity of Data Reviewed Radiology: ordered.  Risk Prescription drug management.   Pt here for uri sxs that started about 3-4 days ago. Covid/flu negative. CXR negative for acute infiltrate. Patients symptoms are consistent with URI. She was given a duo neb and steroids in the ED. Her vital signs are reassuring.  She is mildly tachycardic but I suspect that this is likely related to the albuterol treatment.  I have very low suspicion for any emergent cardiopulmonary cause of symptoms that would require further work-up or admission to the hospital at this time.  Her oxygen has been normal here in the ED and she feels improved on reassessment.  I feel she is appropriate to be discharged home with prednisone and I will also cover her with antibiotics for suspected early pneumonia given her Rales auscultated in the left lower lobe.  Pt will be discharged with symptomatic treatment.  Verbalizes understanding and is agreeable with plan. Pt is hemodynamically stable & in NAD prior to dc.   Final Clinical Impression(s) / ED Diagnoses Final diagnoses:  Upper respiratory tract infection,  unspecified type    Rx / DC Orders ED Discharge Orders          Ordered    doxycycline (VIBRAMYCIN) 100 MG capsule  2 times daily        11/21/21 2204    predniSONE (DELTASONE) 20 MG tablet  Daily        11/21/21 2204    benzonatate (TESSALON) 100 MG capsule  Every 8 hours        11/21/21 2209              Bishop [place] 11/21/21 2209    Hayden Rasmussen, MD 11/22/21 1119

## 2021-11-22 ENCOUNTER — Other Ambulatory Visit: Payer: Self-pay

## 2021-11-23 ENCOUNTER — Ambulatory Visit: Payer: Medicaid Other

## 2021-11-28 ENCOUNTER — Other Ambulatory Visit: Payer: Self-pay

## 2021-11-29 ENCOUNTER — Other Ambulatory Visit: Payer: Self-pay

## 2021-11-29 ENCOUNTER — Telehealth: Payer: Self-pay

## 2021-11-29 NOTE — Telephone Encounter (Signed)
Copied from [place] [id number]. Topic: Appointment Scheduling - Scheduling Inquiry for Clinic >> Nov 29, 2021  2:53 PM Erick Blinks wrote: Reason for CRM: Pt needs a TB skin test  Best contact: [phone]

## 2021-11-29 NOTE — Telephone Encounter (Signed)
Appointment set

## 2021-11-30 ENCOUNTER — Ambulatory Visit: Payer: Self-pay | Attending: Critical Care Medicine

## 2021-11-30 ENCOUNTER — Other Ambulatory Visit: Payer: Self-pay

## 2021-11-30 DIAGNOSIS — Z111 Encounter for screening for respiratory tuberculosis: Secondary | ICD-10-CM

## 2021-12-03 ENCOUNTER — Ambulatory Visit: Payer: Self-pay | Attending: Critical Care Medicine

## 2021-12-03 DIAGNOSIS — Z111 Encounter for screening for respiratory tuberculosis: Secondary | ICD-10-CM

## 2021-12-03 LAB — TB SKIN TEST
Induration: 0 mm
TB Skin Test: NEGATIVE

## 2021-12-05 ENCOUNTER — Telehealth: Payer: Self-pay

## 2021-12-05 NOTE — Telephone Encounter (Signed)
-----   Message from Elsie Stain, MD sent at 12/03/2021  9:39 AM EST ----- Make sure pt knows tb skin test was NEG

## 2021-12-05 NOTE — Telephone Encounter (Signed)
Called pt the other and left vm to see about the tb results

## 2021-12-07 ENCOUNTER — Ambulatory Visit: Payer: Self-pay | Attending: Critical Care Medicine | Admitting: Pharmacist

## 2021-12-07 ENCOUNTER — Encounter: Payer: Self-pay | Admitting: Pharmacist

## 2021-12-07 ENCOUNTER — Other Ambulatory Visit: Payer: Self-pay

## 2021-12-07 DIAGNOSIS — E119 Type 2 diabetes mellitus without complications: Secondary | ICD-10-CM

## 2021-12-07 MED ORDER — BASAGLAR KWIKPEN 100 UNIT/ML ~~LOC~~ SOPN
10.0000 [IU] | PEN_INJECTOR | Freq: Every day | SUBCUTANEOUS | 4 refills | Status: DC
  Filled 2021-12-07: qty 3, 30d supply, fill #0

## 2021-12-07 MED ORDER — TRULICITY 1.5 MG/0.5ML ~~LOC~~ SOAJ
1.5000 mg | SUBCUTANEOUS | 2 refills | Status: DC
  Filled 2021-12-07: qty 2, 28d supply, fill #0
  Filled 2022-01-02: qty 2, 28d supply, fill #1

## 2021-12-07 NOTE — Progress Notes (Signed)
S:     No chief complaint on file.  Sydney Williams is a 56 y.o. female who presents for diabetes evaluation, education, and management. PMH is significant for HTN, aortic atherosclerosis, asthma, allergic rhinocuonjunctivitis (seasonal), GERD, T2DM, HLD. Patient was referred and last seen by Primary Care Provider, Dr. Joya Gaskins, on 11/15/2021.   Today, She arrives in good spirits. Reports feeling well. She brings her meter with her for review of her home CBGs.   Family/Social History:  Fhx: DM, HTN, aneurysm, stroke Tobacco: current 0.25 PPD smoker Alcohol: none reported  Current diabetes medications include: Basaglar 14 units daily, metformin 1000mg  BID (takes two 500 mg tablets BID), Trulicity 7.51 mg weekly  Patient states that She is taking her medications as prescribed. Denies any side effects or issues with affording the medication. Insurance coverage: self pay. Established with our pharmacy for patient assistance.   Patient denies hypoglycemic events.  Patient denies nocturia (nighttime urination).  Patient denies neuropathy (nerve pain). Patient denies visual changes. Patient reports self foot exams.   Patient reported dietary habits:  - Reports that she is eating well. Denies any intake of sweets, sugary beverages or excessive carbs.   Patient-reported exercise habits: walks daily.    O:   Brings her glucometer with her today. 7 day average blood glucose: 103 14 day: 105 30 day: 105  % in range: ~90%, since last month only 2 fasting levels >130 (138, 157). Tells me her blood sugars were high in November and December.    Lab Results  Component Value Date   HGBA1C 9.7 (H) 11/15/2021   There were no vitals filed for this visit.  Lipid Panel     Component Value Date/Time   CHOL 140 11/16/2021 0831   TRIG 155 (H) 11/16/2021 0831   HDL 36 (L) 11/16/2021 0831   CHOLHDL 3.9 11/16/2021 0831   CHOLHDL 3.4 06/20/2017 1615   VLDL 62 (H) 06/20/2017 1615   LDLCALC  77 11/16/2021 0831    Clinical Atherosclerotic Cardiovascular Disease (ASCVD): No  The 10-year ASCVD risk score (Arnett DK, et al., 2019) is: 33.4%   Values used to calculate the score:     Age: 58 years     Sex: Female     Is Non-Hispanic African American: Yes     Diabetic: Yes     Tobacco smoker: Yes     Systolic Blood Pressure: 025 mmHg     Is BP treated: Yes     HDL Cholesterol: 36 mg/dL     Total Cholesterol: 140 mg/dL    A/P: Diabetes longstanding currently uncontrolled based on A1c however home sugars are at goal. Last A1c revealed improvement but was likely still above goal due to elevated sugars in November and December. She tells me her home CBGs have been good now for about a month and her GM averages support this. Patient is able to verbalize appropriate hypoglycemia management plan. Medication adherence appears appropriate. -Increased dose of Trulicity to 1.5 mg weekly.  -Decrease Basaglar to 10 units daily.  -Continue metformin 1000 mg BID (takes as two 500mg  tabs BID).  -Extensively discussed pathophysiology of diabetes, recommended lifestyle interventions, dietary effects on blood sugar control.  -Counseled on s/sx of and management of hypoglycemia.  -Next A1c anticipated 01/2022.   Written patient instructions provided. Patient verbalized understanding of treatment plan. Total time in face to face counseling 30 minutes.    Follow up pharmacist clinic visit in 2 weeks.  Benard Halsted, PharmD,  BCACP, [place] [phone]

## 2021-12-10 ENCOUNTER — Other Ambulatory Visit: Payer: Self-pay

## 2021-12-13 ENCOUNTER — Other Ambulatory Visit: Payer: Self-pay

## 2021-12-14 ENCOUNTER — Other Ambulatory Visit: Payer: Self-pay

## 2021-12-17 ENCOUNTER — Other Ambulatory Visit: Payer: Self-pay

## 2021-12-19 ENCOUNTER — Other Ambulatory Visit: Payer: Self-pay | Admitting: Critical Care Medicine

## 2021-12-19 ENCOUNTER — Other Ambulatory Visit: Payer: Self-pay

## 2021-12-19 DIAGNOSIS — I10 Essential (primary) hypertension: Secondary | ICD-10-CM

## 2021-12-19 MED ORDER — METOPROLOL TARTRATE 50 MG PO TABS
50.0000 mg | ORAL_TABLET | Freq: Two times a day (BID) | ORAL | 2 refills | Status: DC
  Filled 2021-12-19: qty 60, 30d supply, fill #0
  Filled 2022-01-24: qty 60, 30d supply, fill #1
  Filled 2022-03-03: qty 60, 30d supply, fill #2

## 2021-12-19 MED FILL — Acyclovir Cap 200 MG: ORAL | 30 days supply | Qty: 60 | Fill #1 | Status: AC

## 2021-12-19 NOTE — Telephone Encounter (Signed)
Requested medication (s) are due for refill today:   Yes  Requested medication (s) are on the active medication list:   Yes  Future visit scheduled:   Yes   Last ordered: 09/11/2021 #60, 2 refills  Returned because no refills remain   Requested Prescriptions  Pending Prescriptions Disp Refills   metoprolol tartrate (LOPRESSOR) 50 MG tablet 60 tablet 2    Sig: Take 1 tablet (50 mg total) by mouth 2 (two) times daily.     Cardiovascular:  Beta Blockers Failed - 12/19/2021  6:07 AM      Failed - Last BP in normal range    BP Readings from Last 1 Encounters:  11/21/21 (!) 144/90          Passed - Last Heart Rate in normal range    Pulse Readings from Last 1 Encounters:  11/21/21 (!) 106          Passed - Valid encounter within last 6 months    Recent Outpatient Visits           1 week ago Type 2 diabetes mellitus without complication, without long-term current use of insulin ([place])   [place], Annie Main L, RPH-CPP   1 month ago Type 2 diabetes mellitus without complication, without long-term current use of insulin ([place])   [place] Elsie Stain, MD   2 months ago Type 2 diabetes mellitus without complication, without long-term current use of insulin [facility])   [place], [place], [place]   3 months ago Type 2 diabetes mellitus without complication, without long-term current use of insulin [facility])   [place] Elsie Stain, MD   3 months ago Oral thrush   [place] Elsie Stain, MD       Future Appointments             In 2 days Daisy Blossom, Jarome Matin, [place]   In 3 weeks Joya Gaskins Burnett Harry, MD [place]

## 2021-12-20 ENCOUNTER — Other Ambulatory Visit: Payer: Self-pay

## 2021-12-21 ENCOUNTER — Ambulatory Visit: Payer: Self-pay | Attending: Critical Care Medicine | Admitting: Pharmacist

## 2021-12-21 ENCOUNTER — Other Ambulatory Visit: Payer: Self-pay

## 2021-12-21 DIAGNOSIS — E119 Type 2 diabetes mellitus without complications: Secondary | ICD-10-CM

## 2021-12-21 NOTE — Progress Notes (Signed)
° ° °  S:     No chief complaint on file.  Sydney Williams is a 56 y.o. female who presents for diabetes evaluation, education, and management. PMH is significant for HTN, aortic atherosclerosis, asthma, allergic rhinocuonjunctivitis (seasonal), GERD, T2DM, HLD. Patient was referred and last seen by Primary Care Provider, Dr. Joya Gaskins, on 11/15/2021. I saw her on 12/07/2021 and increased Trulicity. We decreased her insulin.   Today, pt  arrives in good spirits. Reports feeling well. She brings her meter with her for review of her home CBGs. Denies abdominal pain, NV, changes in vision since Trulicity increase. 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   Family/Social History:  Fhx: DM, HTN, aneurysm, stroke Tobacco: current 0.25 PPD smoker Alcohol: none reported  Current diabetes medications include: Basaglar 10 units daily (pt stopped this after taking the 1.5), metformin 1000mg  BID (takes two 500 mg tablets BID), Trulicity 1.5 mg weekly  Patient states that she is taking her medications as prescribed. Denies any side effects or issues with affording the medication. Insurance coverage: self pay. Established with our pharmacy for patient assistance.   Patient denies hypoglycemic events.  Patient denies nocturia (nighttime urination).  Patient denies neuropathy (nerve pain). Patient denies visual changes. Patient reports self foot exams.   Patient reported dietary habits:  - Reports that she is eating well. Denies any intake of sweets, sugary beverages or excessive carbs.   Patient-reported exercise habits: walks daily.   O:   Brings her glucometer with her today. 7 day average blood glucose: 111 14 day: 107 30 day:  107     Lab Results  Component Value Date   HGBA1C 9.7 (H) 11/15/2021   There were no vitals filed for this visit.  Lipid Panel     Component Value Date/Time   CHOL 140 11/16/2021 0831   TRIG 155 (H) 11/16/2021 0831   HDL 36 (L) 11/16/2021 0831   CHOLHDL 3.9 11/16/2021 0831    CHOLHDL 3.4 06/20/2017 1615   VLDL 62 (H) 06/20/2017 1615   LDLCALC 77 11/16/2021 0831    Clinical Atherosclerotic Cardiovascular Disease (ASCVD): No  The 10-year ASCVD risk score (Arnett DK, et al., 2019) is: 33.4%   Values used to calculate the score:     Age: 30 years     Sex: Female     Is Non-Hispanic African American: Yes     Diabetic: Yes     Tobacco smoker: Yes     Systolic Blood Pressure: 867 mmHg     Is BP treated: Yes     HDL Cholesterol: 36 mg/dL     Total Cholesterol: 140 mg/dL    A/P: Diabetes longstanding currently uncontrolled based on A1c however home sugars are at goal. Patient is able to verbalize appropriate hypoglycemia management plan. Medication adherence appears appropriate. -Continue Trulicity to 1.5 mg weekly.  -D/c Basaglar.  -Continue metformin 1000 mg BID (takes as two 500mg  tabs BID).  -Extensively discussed pathophysiology of diabetes, recommended lifestyle interventions, dietary effects on blood sugar control.  -Counseled on s/sx of and management of hypoglycemia.  -Next A1c anticipated 01/2022.   Written patient instructions provided. Patient verbalized understanding of treatment plan. Total time in face to face counseling 30 minutes.    Follow up PCP clinic visit in 1 month.  Benard Halsted, PharmD, [place], [place] [phone]

## 2021-12-25 ENCOUNTER — Other Ambulatory Visit: Payer: Self-pay

## 2022-01-01 ENCOUNTER — Other Ambulatory Visit: Payer: Self-pay

## 2022-01-02 ENCOUNTER — Other Ambulatory Visit: Payer: Self-pay

## 2022-01-04 ENCOUNTER — Other Ambulatory Visit: Payer: Self-pay

## 2022-01-09 ENCOUNTER — Other Ambulatory Visit: Payer: Self-pay | Admitting: Critical Care Medicine

## 2022-01-09 DIAGNOSIS — Z1231 Encounter for screening mammogram for malignant neoplasm of breast: Secondary | ICD-10-CM

## 2022-01-10 ENCOUNTER — Other Ambulatory Visit: Payer: Self-pay

## 2022-01-14 ENCOUNTER — Other Ambulatory Visit: Payer: Self-pay | Admitting: Critical Care Medicine

## 2022-01-14 ENCOUNTER — Other Ambulatory Visit: Payer: Self-pay

## 2022-01-15 ENCOUNTER — Ambulatory Visit: Payer: Medicaid Other | Attending: Critical Care Medicine | Admitting: Critical Care Medicine

## 2022-01-15 ENCOUNTER — Other Ambulatory Visit: Payer: Self-pay

## 2022-01-15 ENCOUNTER — Encounter: Payer: Self-pay | Admitting: Critical Care Medicine

## 2022-01-15 VITALS — BP 117/87 | HR 87 | Wt 242.4 lb

## 2022-01-15 DIAGNOSIS — I10 Essential (primary) hypertension: Secondary | ICD-10-CM

## 2022-01-15 DIAGNOSIS — Z7984 Long term (current) use of oral hypoglycemic drugs: Secondary | ICD-10-CM | POA: Insufficient documentation

## 2022-01-15 DIAGNOSIS — J4540 Moderate persistent asthma, uncomplicated: Secondary | ICD-10-CM

## 2022-01-15 DIAGNOSIS — F17210 Nicotine dependence, cigarettes, uncomplicated: Secondary | ICD-10-CM | POA: Insufficient documentation

## 2022-01-15 DIAGNOSIS — E876 Hypokalemia: Secondary | ICD-10-CM

## 2022-01-15 DIAGNOSIS — Z720 Tobacco use: Secondary | ICD-10-CM

## 2022-01-15 DIAGNOSIS — Z7985 Long-term (current) use of injectable non-insulin antidiabetic drugs: Secondary | ICD-10-CM | POA: Insufficient documentation

## 2022-01-15 DIAGNOSIS — E119 Type 2 diabetes mellitus without complications: Secondary | ICD-10-CM | POA: Insufficient documentation

## 2022-01-15 DIAGNOSIS — B369 Superficial mycosis, unspecified: Secondary | ICD-10-CM

## 2022-01-15 DIAGNOSIS — H6241 Otitis externa in other diseases classified elsewhere, right ear: Secondary | ICD-10-CM

## 2022-01-15 LAB — POCT GLYCOSYLATED HEMOGLOBIN (HGB A1C): HbA1c, POC (controlled diabetic range): 6.5 % (ref 0.0–7.0)

## 2022-01-15 LAB — GLUCOSE, POCT (MANUAL RESULT ENTRY): POC Glucose: 91 mg/dl (ref 70–99)

## 2022-01-15 MED ORDER — AMLODIPINE BESYLATE 10 MG PO TABS
ORAL_TABLET | Freq: Every day | ORAL | 2 refills | Status: DC
  Filled 2022-01-15: qty 90, fill #0
  Filled 2022-02-13: qty 90, 90d supply, fill #0

## 2022-01-15 MED ORDER — CLOTRIMAZOLE 1 % EX SOLN
1.0000 "application " | Freq: Two times a day (BID) | CUTANEOUS | 0 refills | Status: DC
  Filled 2022-01-15: qty 30, 15d supply, fill #0

## 2022-01-15 MED ORDER — TRULICITY 1.5 MG/0.5ML ~~LOC~~ SOAJ
1.5000 mg | SUBCUTANEOUS | 2 refills | Status: DC
Start: 2022-01-15 — End: 2022-05-01
  Filled 2022-01-15 – 2022-02-06 (×3): qty 2, 28d supply, fill #0
  Filled 2022-03-06: qty 2, 28d supply, fill #1
  Filled 2022-04-10: qty 2, 28d supply, fill #2

## 2022-01-15 MED ORDER — IBUPROFEN 600 MG PO TABS
ORAL_TABLET | ORAL | 1 refills | Status: DC
  Filled 2022-01-15: qty 90, 30d supply, fill #0
  Filled 2022-03-03: qty 90, 30d supply, fill #1

## 2022-01-15 MED ORDER — ATORVASTATIN CALCIUM 40 MG PO TABS
ORAL_TABLET | Freq: Every day | ORAL | 2 refills | Status: DC
  Filled 2022-01-15: qty 90, fill #0
  Filled 2022-03-20: qty 30, 30d supply, fill #0
  Filled 2022-04-27: qty 30, 30d supply, fill #1

## 2022-01-15 MED ORDER — FLUTICASONE-UMECLIDIN-VILANT 200-62.5-25 MCG/ACT IN AEPB
1.0000 | INHALATION_SPRAY | Freq: Every day | RESPIRATORY_TRACT | 4 refills | Status: DC
  Filled 2022-01-15 – 2022-03-04 (×3): qty 60, 30d supply, fill #0
  Filled 2022-03-26 – 2022-03-29 (×2): qty 60, 30d supply, fill #1
  Filled 2022-04-18: qty 60, 30d supply, fill #2

## 2022-01-15 NOTE — Patient Instructions (Signed)
No change in medications ? ?Return in 4 months ? ?GREAT JOB!!!! ? ?Ear drop refills sent ?

## 2022-01-15 NOTE — Progress Notes (Signed)
? ?Established Patient Office Visit ? ?Subjective:  ?Patient ID: Sydney Williams, female    DOB: 15-Jan-1966  Age: 56 y.o. MRN: [id number] ? ?CC:  ?Chief Complaint  ?Patient presents with  ? Diabetes  ? ? ?HPI ?11/15/21 ?Sydney Williams presents for follow up for diabetes. She was last seen on 10/18/2021 by Freeman Caldron PA-C. At that time she had been experiencing unintentional weight loss and loss of appetite. A CT was ordered and completed on 10/30/2021.There were no acute findings, but fatty liver, aortic atherosclerosis, and myomatous uterus were noted. Labs were also ordered at that time. The only abnormalities were HbA1C of 12.2% and hypokalemia. She was informed about the hypokalemia, and has been eating bananas to try to increase her potassium levels since she cannot tolerate oral potassium.  ? ?Today, she states that she is no longer experiencing the loss of appetite and unexplained weight loss. She believes her symptoms were most likely related to her diabetes not being well-controlled. She is now using insulin, in addition to her Trulicity and Metformin. She states her blood sugars have been much better lately. She brought her home glucose monitor to the appointment today. The readings ranged from 81-128 mg/dL. She would like to eventually be able to stop using the insulin. ? ?Sydney Williams expresses renewed commitment to lifestyle modifications to manage her conditions. She has made changes to her diet. She eats 3 meals per day, and drinks water. She will typically eat a boiled egg  and whole grain toast for breakfast, a salad for lunch, and varied meals for dinner. She always tries to eat vegetables with dinner. She has been looking for lower sodium options at the grocery store. She drinks water. She cooks for herself, her husband, and her granddaughter, and expresses that she wants to feed her granddaughter healthy foods as well. She is interested in starting an exercise routine.  ? ?The patient is also committed  to smoking cessation. She had been down to 3 cigarettes per day, but her use increased slightly to 4-5 cigarettes per day due to the stress she was experiencing surrounding her health. Nicotine lozenges were ordered at the last visit, but these were unavailable at the pharmacy. She still is interested in using the lozenges.  ? ?Sydney Williams is up to date with health screenings. She has a mammogram scheduled next month. She saw her eye doctor on 10/26/21, and was told she has no evidence of diabetic eye disease. She saw her dentist about two weeks ago. She is up to date on her vaccinations.  ? ?01/15/22 ?Patient seen in return follow-up and is doing extremely well A1c today is down to 6.5 blood sugar 91 she is down to 3 cigarettes daily.  She is lost 30 pounds in weight since November.  She has a mammogram scheduled for April.  She did have a viral URI syndrome in January but was COVID-negative. ?Patient's been following with clinical pharmacy ? ?Past Medical History:  ?Diagnosis Date  ? Asthma   ? 2015 hospitaliation for asthma  ? Diabetes mellitus   ? Type II  ? Family history of breast cancer   ? Fibroids 2010  ? Genital herpes   ? Genital herpes 11/30/2014  ? GERD (gastroesophageal reflux disease)   ? Hyperlipidemia   ? Hypertension   ? Impaired fasting blood sugar   ? Migraines   ? Obesity   ? Right carpal tunnel syndrome   ? Smoker   ? ? ?Past Surgical  History:  ?Procedure Laterality Date  ? BACK SURGERY  10/24/2020  ? CARDIAC CATHETERIZATION    ? CARPAL TUNNEL RELEASE Right 08/30/2020  ? Procedure: RIGHT CARPAL TUNNEL RELEASE;  Surgeon: Leandrew Koyanagi, MD;  Location: [facility];  Service: Orthopedics;  Laterality: Right;  ? COLONOSCOPY  03/13/2016  ? Dr. Carlean Purl, normal, repeat 2027  ? CYST EXCISION    ? left neck/postauricular region, benign  ? DILATION AND CURETTAGE OF UTERUS    ? LAPAROSCOPIC ABDOMINAL EXPLORATION    ? removal of ectopic preg  ? LEFT HEART CATH AND CORONARY ANGIOGRAPHY N/A 06/23/2017   ? Procedure: LEFT HEART CATH AND CORONARY ANGIOGRAPHY;  Surgeon: Nelva Bush, MD;  Location: [facility];  Service: Cardiovascular;  Laterality: N/A;  ? TUBAL LIGATION    ? ? ?Family History  ?Problem Relation Age of Onset  ? Diabetes Mother   ? Hypertension Mother   ? Aneurysm Mother   ? Stroke Mother   ? Cancer Mother   ?     cervical cancer  ? Cancer Maternal Aunt   ?     breast  ? Cancer Cousin   ?     breast/breast  ? Lupus Cousin   ? Heart disease Neg Hx   ? Colon cancer Neg Hx   ? Allergic rhinitis Neg Hx   ? Angioedema Neg Hx   ? Asthma Neg Hx   ? Atopy Neg Hx   ? Eczema Neg Hx   ? Immunodeficiency Neg Hx   ? Urticaria Neg Hx   ? ? ?Social History  ? ?Socioeconomic History  ? Marital status: Legally Separated  ?  Spouse name: Not on file  ? Number of children: Not on file  ? Years of education: Not on file  ? Highest education level: Not on file  ?Occupational History  ? Not on file  ?Tobacco Use  ? Smoking status: Every Day  ?  Packs/day: 0.25  ?  Years: 14.00  ?  Pack years: 3.50  ?  Types: Cigarettes  ? Smokeless tobacco: Never  ?Vaping Use  ? Vaping Use: Never used  ?Substance and Sexual Activity  ? Alcohol use: Yes  ?  Comment: occ  ? Drug use: No  ? Sexual activity: Yes  ?  Birth control/protection: Surgical  ?Other Topics Concern  ? Not on file  ?Social History Narrative  ? Married, and her granddaughter lives with her, works as Quarry manager at [employer], exercise - activity at work, walking.  12/2016  ? ?Social Determinants of Health  ? ?Financial Resource Strain: Not on file  ?Food Insecurity: Not on file  ?Transportation Needs: Not on file  ?Physical Activity: Not on file  ?Stress: Not on file  ?Social Connections: Not on file  ?Intimate Partner Violence: Not on file  ? ? ?Outpatient Medications Prior to Visit  ?Medication Sig Dispense Refill  ? acetaminophen (TYLENOL) 500 MG tablet Take 1,000 mg by mouth every 6 (six) hours as needed for moderate pain.    ? acyclovir (ZOVIRAX) 200 MG capsule  TAKE 1 CAPSULE (200 MG TOTAL) BY MOUTH 2 (TWO) TIMES DAILY. 180 capsule 3  ? albuterol (VENTOLIN HFA) 108 (90 Base) MCG/ACT inhaler INHALE 1-2 PUFFS INTO THE LUNGS EVERY 6 (SIX) HOURS AS NEEDED FOR WHEEZING OR SHORTNESS OF BREATH. 18 g 0  ? ascorbic acid (VITAMIN C) 500 MG tablet Take 500 mg by mouth daily.    ? aspirin (EQ ASPIRIN ADULT LOW DOSE)  81 MG EC tablet Take 1 tablet (81 mg total) by mouth daily. Swallow whole. 90 tablet 3  ? chlorthalidone (HYGROTEN) 15 MG tablet Take 1 tablet (15 mg total) by mouth daily. 90 tablet 1  ? ferrous sulfate 325 (65 FE) MG tablet Take 325 mg by mouth daily with breakfast.    ? furosemide (LASIX) 20 MG tablet TAKE 1 TABLET (20 MG TOTAL) BY MOUTH DAILY AS NEEDED FOR EDEMA. 30 tablet 1  ? Insulin Pen Needle 31G X 8 MM MISC Use as directed with insulin 50 each 2  ? metFORMIN (GLUCOPHAGE) 500 MG tablet Take 2 tablets (1,000 mg total) by mouth 2 (two) times daily with a meal. 360 tablet 1  ? metoprolol tartrate (LOPRESSOR) 50 MG tablet Take 1 tablet (50 mg total) by mouth 2 (two) times daily. 60 tablet 2  ? nicotine polacrilex (NICORETTE MINI) 4 MG lozenge Use 3 times daily to stop smoking 100 tablet 4  ? Vitamin D, Cholecalciferol, 25 MCG (1000 UT) TABS Take 1 tablet by mouth once daily 90 tablet 0  ? amLODipine (NORVASC) 10 MG tablet TAKE 1 TABLET (10 MG TOTAL) BY MOUTH DAILY. TO LOWER BLOOD PRESSURE 90 tablet 2  ? atorvastatin (LIPITOR) 40 MG tablet TAKE 1 TABLET (40 MG TOTAL) BY MOUTH DAILY. 90 tablet 2  ? Dulaglutide (TRULICITY) 1.5 EQ/6.8TM SOPN Inject 1.5 mg into the skin once a week. 2 mL 2  ? Fluticasone-Umeclidin-Vilant 200-62.5-25 MCG/ACT AEPB Inhale 1 puff into the lungs daily. Rinse mouth after each use. 60 each 4  ? ibuprofen (ADVIL) 600 MG tablet TAKE 1 TABLET (600 MG TOTAL) BY MOUTH EVERY 8 (EIGHT) HOURS AS NEEDED FOR MODERATE PAIN. TAKE AFTER EATING 90 tablet 1  ? magic mouthwash (nystatin, diphenhydrAMINE, alum & mag hydroxide) suspension mixture Swish and spit 5  mLs 3 (three) times daily. 240 mL 1  ? promethazine-dextromethorphan (PROMETHAZINE-DM) 6.25-15 MG/5ML syrup Take 5 mLs by mouth 4 (four) times daily as needed for cough. 240 mL 0  ? ?No facility-admin

## 2022-01-15 NOTE — Assessment & Plan Note (Signed)
This has resolved.

## 2022-01-15 NOTE — Assessment & Plan Note (Signed)
Patient at goal with diabetes A1c down to 6.5 continue current medications patient now off insulin just on Trulicity metformin patient was congratulated on her tremendous progress ?

## 2022-01-15 NOTE — Assessment & Plan Note (Signed)
Stable asthma continue inhalers 

## 2022-01-15 NOTE — Assessment & Plan Note (Signed)
? ? ??   Current smoking consumption amount: 1-3 cigarette a day  Dicsussion on advise to quit smoking and smoking impacts: Cardiovascular impact  Patient's willingness to quit: Wants to quit  Methods to quit smoking discussed: Behavioral modification and nicotine replacement  Medication management of smoking session drugs discussed: Nicotine replacement  Resources provided:  AVS   Setting quit date not established  Follow-up arranged 2 months   Time spent counseling the patient: 5 minutes   

## 2022-01-15 NOTE — Assessment & Plan Note (Signed)
Renew clotrimazole cream or ointment to ears ?

## 2022-01-15 NOTE — Assessment & Plan Note (Signed)
Blood pressure now at goal no change in medications 

## 2022-01-16 ENCOUNTER — Other Ambulatory Visit: Payer: Self-pay

## 2022-01-17 ENCOUNTER — Other Ambulatory Visit: Payer: Self-pay

## 2022-01-24 ENCOUNTER — Other Ambulatory Visit: Payer: Self-pay | Admitting: Critical Care Medicine

## 2022-01-25 ENCOUNTER — Other Ambulatory Visit: Payer: Self-pay

## 2022-01-25 MED ORDER — ACYCLOVIR 200 MG PO CAPS
ORAL_CAPSULE | ORAL | 0 refills | Status: DC
  Filled 2022-01-25: qty 60, 30d supply, fill #0
  Filled 2022-02-25: qty 60, 30d supply, fill #1
  Filled 2022-04-10: qty 60, 30d supply, fill #2

## 2022-01-30 ENCOUNTER — Other Ambulatory Visit: Payer: Self-pay

## 2022-01-31 ENCOUNTER — Ambulatory Visit
Admission: RE | Admit: 2022-01-31 | Discharge: 2022-01-31 | Disposition: A | Discharge status: Home / Self Care | Payer: No Typology Code available for payment source | Source: Ambulatory Visit | Attending: Critical Care Medicine | Admitting: Critical Care Medicine

## 2022-01-31 DIAGNOSIS — Z1231 Encounter for screening mammogram for malignant neoplasm of breast: Secondary | ICD-10-CM

## 2022-02-04 ENCOUNTER — Other Ambulatory Visit: Payer: Self-pay | Admitting: Obstetrics and Gynecology

## 2022-02-04 DIAGNOSIS — R928 Other abnormal and inconclusive findings on diagnostic imaging of breast: Secondary | ICD-10-CM

## 2022-02-05 ENCOUNTER — Telehealth: Payer: Self-pay

## 2022-02-05 NOTE — Telephone Encounter (Signed)
Pt was called and is aware of results, DOB was confirmed.  ?

## 2022-02-05 NOTE — Telephone Encounter (Signed)
-----   Message from Charlott Rakes, MD sent at 02/02/2022  7:29 PM EDT ----- ?Please inform her that her mammogram reveals a possible R breast abnormality and the breast center will be in touch with her to schedule additional testing ?

## 2022-02-06 ENCOUNTER — Other Ambulatory Visit: Payer: Self-pay

## 2022-02-12 ENCOUNTER — Other Ambulatory Visit: Payer: Self-pay

## 2022-02-13 ENCOUNTER — Telehealth: Payer: Self-pay

## 2022-02-13 NOTE — Telephone Encounter (Signed)
-----   Message from Elsie Stain, MD sent at 02/09/2022  6:57 AM EDT ----- ?Let pt know left breast is negative on mammogram but they will contact her for a ultrasound of right breast and it will be scheduled by radiology ?

## 2022-02-13 NOTE — Telephone Encounter (Signed)
Pt was called and is aware of results, DOB was confirmed.  ?

## 2022-02-14 ENCOUNTER — Other Ambulatory Visit: Payer: Self-pay

## 2022-02-15 ENCOUNTER — Other Ambulatory Visit: Payer: Self-pay

## 2022-02-18 ENCOUNTER — Emergency Department (HOSPITAL_COMMUNITY)
Admission: EM | Admit: 2022-02-18 | Discharge: 2022-02-18 | Disposition: A | Discharge status: Home / Self Care | Payer: 59 | Attending: Emergency Medicine | Admitting: Emergency Medicine

## 2022-02-18 ENCOUNTER — Other Ambulatory Visit: Payer: Self-pay

## 2022-02-18 ENCOUNTER — Emergency Department (HOSPITAL_COMMUNITY): Payer: 59

## 2022-02-18 ENCOUNTER — Encounter (HOSPITAL_COMMUNITY): Payer: Self-pay

## 2022-02-18 DIAGNOSIS — Z794 Long term (current) use of insulin: Secondary | ICD-10-CM | POA: Insufficient documentation

## 2022-02-18 DIAGNOSIS — S99921A Unspecified injury of right foot, initial encounter: Secondary | ICD-10-CM | POA: Diagnosis present

## 2022-02-18 DIAGNOSIS — E119 Type 2 diabetes mellitus without complications: Secondary | ICD-10-CM | POA: Diagnosis not present

## 2022-02-18 DIAGNOSIS — S92514A Nondisplaced fracture of proximal phalanx of right lesser toe(s), initial encounter for closed fracture: Secondary | ICD-10-CM | POA: Insufficient documentation

## 2022-02-18 DIAGNOSIS — M79671 Pain in right foot: Secondary | ICD-10-CM | POA: Diagnosis not present

## 2022-02-18 DIAGNOSIS — W228XXA Striking against or struck by other objects, initial encounter: Secondary | ICD-10-CM | POA: Diagnosis not present

## 2022-02-18 DIAGNOSIS — Z7984 Long term (current) use of oral hypoglycemic drugs: Secondary | ICD-10-CM | POA: Diagnosis not present

## 2022-02-18 DIAGNOSIS — Z7982 Long term (current) use of aspirin: Secondary | ICD-10-CM | POA: Insufficient documentation

## 2022-02-18 NOTE — ED Notes (Signed)
Buddy taped toes and ice provided. Pt awaiting d/c  ?

## 2022-02-18 NOTE — ED Provider Notes (Signed)
?[facility] DEPT ?Provider Note ? ? ?CSN: [id number] ?Arrival date & time: 02/18/22  1732 ? ?  ? ?History ? ?Right foot pain ? ?Sydney Williams is a 56 y.o. female hx of diabetes here for evaluation of right lateral aspect foot pain.  Hit toe on object yesterday.  No nailbed damage.  He is able to wiggle her toes however still has some pain.  Some mild swelling without any erythema, warmth, ecchymosis. No Bony tenderness to ankle, proximal foot, tib-fib. Ambulatory in ED wo difficulty ? ?HPI ? ?  ? ?Home Medications ?Prior to Admission medications   ?Medication Sig Start Date End Date Taking? Authorizing Provider  ?acetaminophen (TYLENOL) 500 MG tablet Take 1,000 mg by mouth every 6 (six) hours as needed for moderate pain.    [provider]  ?acyclovir (ZOVIRAX) 200 MG capsule TAKE 1 CAPSULE (200 MG TOTAL) BY MOUTH 2 (TWO) TIMES DAILY. 01/25/22 01/25/23  Charlott Rakes, MD  ?albuterol (VENTOLIN HFA) 108 (90 Base) MCG/ACT inhaler INHALE 1-2 PUFFS INTO THE LUNGS EVERY 6 (SIX) HOURS AS NEEDED FOR WHEEZING OR SHORTNESS OF BREATH. 08/31/20   Elsie Stain, MD  ?amLODipine (NORVASC) 10 MG tablet TAKE 1 TABLET (10 MG TOTAL) BY MOUTH DAILY. TO LOWER BLOOD PRESSURE 01/15/22 01/15/23  Elsie Stain, MD  ?ascorbic acid (VITAMIN C) 500 MG tablet Take 500 mg by mouth daily.    [provider]  ?aspirin (EQ ASPIRIN ADULT LOW DOSE) 81 MG EC tablet Take 1 tablet (81 mg total) by mouth daily. Swallow whole. 02/03/18   Tysinger, Camelia Eng, PA-C  ?atorvastatin (LIPITOR) 40 MG tablet TAKE 1 TABLET (40 MG TOTAL) BY MOUTH DAILY. 01/15/22 01/15/23  Elsie Stain, MD  ?chlorthalidone (HYGROTEN) 15 MG tablet Take 1 tablet (15 mg total) by mouth daily. 11/16/21   Elsie Stain, MD  ?clotrimazole (LOTRIMIN) 1 % external solution Apply 1 application. topically 2 (two) times daily. To right ear canal 01/15/22   Elsie Stain, MD  ?Dulaglutide (TRULICITY) 1.5 BD/5.3GD SOPN Inject 1.5 mg into  the skin once a week. 01/15/22   Elsie Stain, MD  ?ferrous sulfate 325 (65 FE) MG tablet Take 325 mg by mouth daily with breakfast.    [provider]  ?Fluticasone-Umeclidin-Vilant 200-62.5-25 MCG/ACT AEPB Inhale 1 puff into the lungs daily. Rinse mouth after each use. 01/15/22   Elsie Stain, MD  ?furosemide (LASIX) 20 MG tablet TAKE 1 TABLET (20 MG TOTAL) BY MOUTH DAILY AS NEEDED FOR EDEMA. 06/25/21 06/25/22  Elsie Stain, MD  ?ibuprofen (ADVIL) 600 MG tablet TAKE 1 TABLET (600 MG TOTAL) BY MOUTH EVERY 8 (EIGHT) HOURS AS NEEDED FOR MODERATE PAIN. TAKE AFTER EATING 01/15/22 01/15/23  Elsie Stain, MD  ?Insulin Pen Needle 31G X 8 MM MISC Use as directed with insulin 09/11/21   Elsie Stain, MD  ?metFORMIN (GLUCOPHAGE) 500 MG tablet Take 2 tablets (1,000 mg total) by mouth 2 (two) times daily with a meal. 10/19/21   Argentina Donovan, PA-C  ?metoprolol tartrate (LOPRESSOR) 50 MG tablet Take 1 tablet (50 mg total) by mouth 2 (two) times daily. 12/19/21   Elsie Stain, MD  ?nicotine polacrilex (NICORETTE MINI) 4 MG lozenge Use 3 times daily to stop smoking 09/11/21   Elsie Stain, MD  ?Vitamin D, Cholecalciferol, 25 MCG (1000 UT) TABS Take 1 tablet by mouth once daily 02/18/19   Tysinger, Camelia Eng, PA-C  ?   ? ?Allergies    ?  Lisinopril, Potassium chloride, Pneumococcal vaccines, and Vicodin [hydrocodone-acetaminophen]   ? ?Review of Systems   ?Review of Systems  ?Constitutional: Negative.   ?HENT: Negative.    ?Respiratory: Negative.    ?Cardiovascular: Negative.   ?Gastrointestinal: Negative.   ?Genitourinary: Negative.   ?Musculoskeletal:   ?     Right little toe and lateral aspect foot pain  ?Skin: Negative.   ?Neurological: Negative.   ?All other systems reviewed and are negative. ? ?Physical Exam ?Updated Vital Signs ?BP (!) 149/89 (BP Location: Right Arm)   Pulse 87   Temp 98.4 ?F (36.9 ?C) (Oral)   Resp 16   Ht '5\' 11"'$  (1.803 m)   Wt 110.2 kg   LMP 06/29/2015 (Exact  Date)   SpO2 93%   BMI 33.89 kg/m?  ?Physical Exam ?Vitals and nursing note reviewed.  ?Constitutional:   ?   General: She is not in acute distress. ?   Appearance: She is well-developed. She is not ill-appearing, toxic-appearing or diaphoretic.  ?HENT:  ?   Head: Atraumatic.  ?Eyes:  ?   Pupils: Pupils are equal, round, and reactive to light.  ?Cardiovascular:  ?   Rate and Rhythm: Normal rate.  ?   Pulses: Normal pulses.     ?     Dorsalis pedis pulses are 2+ on the right side and 2+ on the left side.  ?   Heart sounds: Normal heart sounds.  ?Pulmonary:  ?   Effort: No respiratory distress.  ?Abdominal:  ?   General: There is no distension.  ?Musculoskeletal:     ?   General: Normal range of motion.  ?   Cervical back: Normal range of motion.  ?   Comments: Bony tenderness to knee, tib-fib, ankle, proximal right lower extremity.  Diffuse tenderness distal lateral aspect foot tenderness, right little toe.  No nailbed damage.  No erythema, warmth, ecchymosis  ?Skin: ?   General: Skin is warm and dry.  ?   Capillary Refill: Capillary refill takes less than 2 seconds.  ?   Comments: Mild soft tissue swelling right little toe no skin breakdown  ?Neurological:  ?   General: No focal deficit present.  ?   Mental Status: She is alert.  ?   Cranial Nerves: Cranial nerves 2-12 are intact.  ?   Sensory: Sensation is intact.  ?   Motor: Motor function is intact.  ?   Gait: Gait is intact.  ?   Comments: Intact sensation ?Ambulatory  ?Psychiatric:     ?   Mood and Affect: Mood normal.  ? ? ?ED Results / Procedures / Treatments   ?Labs ?(all labs ordered are listed, but only abnormal results are displayed) ?Labs Reviewed - No data to display ? ?EKG ?None ? ?Radiology ?DG Foot Complete Right ? ?Result Date: 02/18/2022 ?CLINICAL DATA:  Foot injury. EXAM: RIGHT FOOT COMPLETE - 3+ VIEW COMPARISON:  None. FINDINGS: There is an acute nondisplaced fracture involving the mid and distal aspect of the fifth proximal phalanx. This is  nondisplaced. Joint spaces are maintained. There is overlying soft tissue swelling. There are mild degenerative changes at the first metatarsophalangeal joint. There also mild degenerative changes of the dorsal midfoot. IMPRESSION: 1. Acute nondisplaced fracture of the fifth proximal phalanx. Electronically Signed   By: Ronney Asters M.D.   On: 02/18/2022 18:52   ? ?Procedures ?[facility] ? ?Date/Time: 02/18/2022 7:33 PM ?Performed by: Shelby Dubin A, PA-C ?Authorized by: Nettie Elm, PA-C  ? ?  Consent:  ?  Consent obtained:  Verbal ?  Consent given by:  Patient ?  Risks discussed:  Nerve damage, restricted joint movement, vascular damage, stiffness, recurrent dislocation, fracture and irreducible dislocation ?  Alternatives discussed:  No treatment, alternative treatment, immobilization, referral and delayed treatmentInjury location: toe ?Location details: right fifth toe ?Injury type: fracture ?Fracture type: proximal phalanx ?Pre-procedure neurovascular assessment: neurovascularly intact ?Pre-procedure distal perfusion: normal ?Pre-procedure neurological function: normal ?Pre-procedure range of motion: normal ? ?Anesthesia: ?Local anesthesia used: no ? ?Patient sedated: NoManipulation performed: no ?Immobilization: tape ?Splint Applied by: ED Nurse ?Post-procedure neurovascular assessment: post-procedure neurovascularly intact ?Post-procedure distal perfusion: normal ?Post-procedure neurological function: normal ?Post-procedure range of motion: normal ? ?  ? ? ?Medications Ordered in ED ?Medications - No data to display ? ?ED Course/ Medical Decision Making/ A&P ?  ? ?Here for evaluation of right foot pain after hitting her toe on a dresser 1 day before PTA.  She is neurovascularly intact.  She has tenderness to the distal aspect right foot and at her right little toe.  She wiggles toes without difficulty.  She is ambulatory.  She has no nailbed damage.  No pain to tib-fib, proximal foot,  ankle.  No ecchymosis, erythema. ? ?Imaging personally viewed and interpreted ?Right little toe proximal phalanx fracture ? ?Patient toes buddy taped, discussed RICE for symptomatic management, follow-up outpatient PC

## 2022-02-18 NOTE — ED Triage Notes (Signed)
Patient reports that she hit her right great toe on a dresser yesterday. Patient has swelling and pain present. ?

## 2022-02-18 NOTE — Discharge Instructions (Signed)
Keep the toe taped.  Tylenol and Motrin as needed for pain May ice and elevate ? ?Return for new or worsening symptoms otherwise follow-up with primary care provider ?

## 2022-02-19 ENCOUNTER — Other Ambulatory Visit: Payer: Self-pay

## 2022-02-20 ENCOUNTER — Other Ambulatory Visit: Payer: Self-pay

## 2022-02-22 ENCOUNTER — Other Ambulatory Visit: Payer: Self-pay

## 2022-02-25 ENCOUNTER — Other Ambulatory Visit: Payer: Self-pay

## 2022-03-04 ENCOUNTER — Other Ambulatory Visit: Payer: Self-pay

## 2022-03-06 ENCOUNTER — Other Ambulatory Visit: Payer: Self-pay

## 2022-03-08 ENCOUNTER — Other Ambulatory Visit: Payer: Self-pay

## 2022-03-20 ENCOUNTER — Other Ambulatory Visit: Payer: Self-pay

## 2022-03-26 ENCOUNTER — Other Ambulatory Visit: Payer: Self-pay

## 2022-03-26 ENCOUNTER — Other Ambulatory Visit: Payer: Self-pay | Admitting: Critical Care Medicine

## 2022-03-26 MED ORDER — IBUPROFEN 600 MG PO TABS
ORAL_TABLET | ORAL | 1 refills | Status: DC
  Filled 2022-03-26: qty 90, 30d supply, fill #0
  Filled 2022-05-14: qty 90, 30d supply, fill #1

## 2022-03-29 ENCOUNTER — Other Ambulatory Visit: Payer: Self-pay

## 2022-04-10 ENCOUNTER — Other Ambulatory Visit: Payer: Self-pay

## 2022-04-11 ENCOUNTER — Other Ambulatory Visit: Payer: Self-pay | Admitting: Critical Care Medicine

## 2022-04-11 ENCOUNTER — Ambulatory Visit: Payer: Self-pay | Admitting: *Deleted

## 2022-04-11 ENCOUNTER — Ambulatory Visit
Admission: RE | Admit: 2022-04-11 | Discharge: 2022-04-11 | Disposition: A | Discharge status: Home / Self Care | Payer: No Typology Code available for payment source | Source: Ambulatory Visit | Attending: Obstetrics and Gynecology | Admitting: Obstetrics and Gynecology

## 2022-04-11 ENCOUNTER — Other Ambulatory Visit: Payer: Self-pay

## 2022-04-11 VITALS — BP 130/90 | Wt 244.0 lb

## 2022-04-11 DIAGNOSIS — I10 Essential (primary) hypertension: Secondary | ICD-10-CM

## 2022-04-11 DIAGNOSIS — Z1239 Encounter for other screening for malignant neoplasm of breast: Secondary | ICD-10-CM

## 2022-04-11 DIAGNOSIS — Z1211 Encounter for screening for malignant neoplasm of colon: Secondary | ICD-10-CM

## 2022-04-11 DIAGNOSIS — R928 Other abnormal and inconclusive findings on diagnostic imaging of breast: Secondary | ICD-10-CM

## 2022-04-11 NOTE — Patient Instructions (Addendum)
Explained breast self awareness with [name]. Patient did not need a Pap smear today due to last Pap smear and HPV typing was 02/16/2021. Let her know that based on her history per ASCCP guidelines that her next Pap smear and HPV typing is due in 5 years. Referred patient to the [facility] for a right breast diagnostic mammogram per recommendation. Appointment scheduled Thursday, April 11, 2022 at 1030. Patient aware of appointment and will be there. Discussed smoking cessation with patient. Referred to the [facility] Quitline. Referred to the Lung Cancer Screening Program due to smoking history. [name] verbalized understanding.  Victory Dresden, Arvil Chaco, RN 8:47 AM

## 2022-04-11 NOTE — Progress Notes (Signed)
Sydney Williams is a 56 y.o. female who presents to [facility] clinic today with no complaints. Patient referred to [facility] by the [facility] due to patient had a screening mammogram completed 01/31/2022 that additional imaging of the right breast is recommended for follow up.   Pap Smear: Pap smear not completed today. Last Pap smear was 02/16/2021 at [facility] and was normal with negative HPV. Per patient has history of an abnormal Pap smear 02/02/2018 that was ASCUS with negative HPV that a colposcopy was completed for follow up. Last Pap smear result is available in Epic.   Physical exam: Breasts Breasts symmetrical. No skin abnormalities bilateral breasts. No nipple retraction bilateral breasts. No nipple discharge bilateral breasts. No lymphadenopathy. No lumps palpated bilateral breasts. No complaints of pain or tenderness on exam.      MS DIGITAL SCREENING TOMO BILATERAL  Result Date: 02/01/2022 CLINICAL DATA:  Screening. EXAM: DIGITAL SCREENING BILATERAL MAMMOGRAM WITH TOMOSYNTHESIS AND CAD TECHNIQUE: Bilateral screening digital craniocaudal and mediolateral oblique mammograms were obtained. Bilateral screening digital breast tomosynthesis was performed. The images were evaluated with computer-aided detection. COMPARISON:  Previous exam(s). ACR Breast Density Category b: There are scattered areas of fibroglandular density. FINDINGS: In the right breast, a possible asymmetry warrants further evaluation. In the left breast, no findings suspicious for malignancy. IMPRESSION: Further evaluation is suggested for possible asymmetry in the right breast. RECOMMENDATION: Diagnostic mammogram and possibly ultrasound of the right breast. (Code:FI-R-35M) The patient will be contacted regarding the findings, and additional imaging will be scheduled. BI-RADS CATEGORY  0: Incomplete. Need additional imaging evaluation and/or prior mammograms for comparison.  Electronically Signed   By: Ileana Roup M.D.   On: 02/01/2022 13:48   MM DIGITAL SCREENING BILATERAL  Result Date: 02/12/2018 CLINICAL DATA:  Screening. EXAM: DIGITAL SCREENING BILATERAL MAMMOGRAM WITH CAD COMPARISON:  Previous exam(s). ACR Breast Density Category b: There are scattered areas of fibroglandular density. FINDINGS: There are no findings suspicious for malignancy. Images were processed with CAD. IMPRESSION: No mammographic evidence of malignancy. A result letter of this screening mammogram will be mailed directly to the patient. RECOMMENDATION: Screening mammogram in one year. (Code:SM-B-01Y) BI-RADS CATEGORY  1: Negative. Electronically Signed   By: Claudie Revering M.D.   On: 02/12/2018 08:23    Pelvic/Bimanual Pap is not indicated today per ASCCP guidelines.   Smoking History: Patient is a current smoker. Discussed smoking cessation with patient. Referred to the [facility] Quitline. Referred to the Lung Cancer Screening Program due to smoking history.   Patient Navigation: Patient education provided. Access to services provided for patient through [facility] program.   Colorectal Cancer Screening: Per patient has had colonoscopy completed on 03/13/2016. FIT Test given to patient to complete.  No complaints today.    Breast and Cervical Cancer Risk Assessment: Patient has family history of a maternal aunt having breast cancer. Patient has no known genetic mutations or history of radiation treatment to the chest before age 64. Per patient has history of cervical dysplasia. Patient has no history of being immunocompromised or DES exposure in-utero.  Risk Assessment     Risk Scores       04/11/2022   Last edited by: Royston Bake, CMA   5-year risk: 1.1 %   Lifetime risk: 5.8 %            A: BCCCP exam without pap smear No complaints.  P: Referred patient to the [facility] for  a right breast diagnostic mammogram per recommendation. Appointment scheduled  Thursday, April 11, 2022 at 1030.  Loletta Parish, RN 04/11/2022 8:47 AM

## 2022-04-12 ENCOUNTER — Other Ambulatory Visit: Payer: Self-pay

## 2022-04-12 ENCOUNTER — Ambulatory Visit: Payer: Self-pay

## 2022-04-12 MED ORDER — METOPROLOL TARTRATE 50 MG PO TABS
50.0000 mg | ORAL_TABLET | Freq: Two times a day (BID) | ORAL | 2 refills | Status: DC
  Filled 2022-04-12: qty 60, 30d supply, fill #0

## 2022-04-12 NOTE — Telephone Encounter (Signed)
     Chief Complaint: Right Great toe pain Symptoms: Pain Frequency: 2 days ago Pertinent Negatives: Patient denies swelling Disposition: '[]'$ ED /'[]'$ Urgent Care (no appt availability in office) / '[]'$ Appointment(In office/virtual)/ '[]'$  [facility]/ '[]'$ Home Care/ '[]'$ Refused Recommended Disposition /'[]'$ [facility]/ '[x]'$  Follow-up with PCP Additional Notes: Pt. Asking to be worked in today. Please advise.  Answer Assessment - Initial Assessment Questions 1. ONSET: "When did the pain start?"      2 days ago 2. LOCATION: "Where is the pain located?"   (e.g., around nail, entire toe, at foot joint)      Right Great toe 3. PAIN: "How bad is the pain?"    (Scale 1-10; or mild, moderate, severe)   -  MILD (1-3): doesn't interfere with normal activities    -  MODERATE (4-7): interferes with normal activities (e.g., work or school) or awakens from sleep, limping    -  SEVERE (8-10): excruciating pain, unable to do any normal activities, unable to walk     8 4. APPEARANCE: "What does the toe look like?" (e.g., redness, swelling, bruising, pallor)     No 5. CAUSE: "What do you think is causing the toe pain?"     Maybe gout 6. OTHER SYMPTOMS: "Do you have any other symptoms?" (e.g., leg pain, rash, fever, numbness)     No 7. PREGNANCY: "Is there any chance you are pregnant?" "When was your last menstrual period?"     No  Protocols used: Toe Pain-A-AH

## 2022-04-12 NOTE — Telephone Encounter (Signed)
Requested medication (s) are due for refill today:   Yes  No refills remaining  Requested medication (s) are on the active medication list:   Yes  Future visit scheduled:   Yes   Last ordered: 12/19/2021 #60, 2 refills  Returned because no refills remain   Requested Prescriptions  Pending Prescriptions Disp Refills   metoprolol tartrate (LOPRESSOR) 50 MG tablet 60 tablet 2    Sig: Take 1 tablet (50 mg total) by mouth 2 (two) times daily.     Cardiovascular:  Beta Blockers Failed - 04/11/2022  5:22 PM      Failed - Last BP in normal range    BP Readings from Last 1 Encounters:  04/11/22 130/90         Passed - Last Heart Rate in normal range    Pulse Readings from Last 1 Encounters:  02/18/22 83         Passed - Valid encounter within last 6 months    Recent Outpatient Visits           2 months ago Type 2 diabetes mellitus without complication, without long-term current use of insulin ([place])   [place] Elsie Stain, MD   3 months ago Type 2 diabetes mellitus without complication, without long-term current use of insulin [facility])   [place], Annie Main L, RPH-CPP   4 months ago Type 2 diabetes mellitus without complication, without long-term current use of insulin [facility])   [place], Annie Main L, RPH-CPP   4 months ago Type 2 diabetes mellitus without complication, without long-term current use of insulin [facility])   [place] Elsie Stain, MD   5 months ago Type 2 diabetes mellitus without complication, without long-term current use of insulin [facility])   [place], Dionne Bucy, [place]       Future Appointments             In 1 month Joya Gaskins, Burnett Harry, MD [place]

## 2022-04-18 ENCOUNTER — Other Ambulatory Visit: Payer: Self-pay

## 2022-04-22 ENCOUNTER — Other Ambulatory Visit: Payer: Self-pay

## 2022-04-23 ENCOUNTER — Other Ambulatory Visit: Payer: Self-pay

## 2022-04-26 ENCOUNTER — Other Ambulatory Visit: Payer: Self-pay

## 2022-04-29 ENCOUNTER — Other Ambulatory Visit: Payer: Self-pay

## 2022-05-01 ENCOUNTER — Other Ambulatory Visit: Payer: Self-pay

## 2022-05-01 ENCOUNTER — Other Ambulatory Visit: Payer: Self-pay | Admitting: Critical Care Medicine

## 2022-05-01 MED ORDER — TRULICITY 1.5 MG/0.5ML ~~LOC~~ SOAJ
1.5000 mg | SUBCUTANEOUS | 2 refills | Status: DC
  Filled 2022-05-01: qty 2, 28d supply, fill #0
  Filled 2022-05-29: qty 2, 28d supply, fill #1
  Filled 2022-06-26: qty 2, 28d supply, fill #2

## 2022-05-03 ENCOUNTER — Other Ambulatory Visit: Payer: Self-pay

## 2022-05-06 ENCOUNTER — Encounter (HOSPITAL_COMMUNITY): Payer: Self-pay | Admitting: Student

## 2022-05-06 ENCOUNTER — Other Ambulatory Visit: Payer: Self-pay

## 2022-05-06 ENCOUNTER — Emergency Department (HOSPITAL_COMMUNITY): Payer: 59

## 2022-05-06 ENCOUNTER — Emergency Department (HOSPITAL_COMMUNITY)
Admission: EM | Admit: 2022-05-06 | Discharge: 2022-05-06 | Disposition: A | Discharge status: Home / Self Care | Payer: 59 | Attending: Emergency Medicine | Admitting: Emergency Medicine

## 2022-05-06 DIAGNOSIS — Z794 Long term (current) use of insulin: Secondary | ICD-10-CM | POA: Diagnosis not present

## 2022-05-06 DIAGNOSIS — M79672 Pain in left foot: Secondary | ICD-10-CM | POA: Diagnosis present

## 2022-05-06 DIAGNOSIS — M7989 Other specified soft tissue disorders: Secondary | ICD-10-CM | POA: Diagnosis not present

## 2022-05-06 DIAGNOSIS — Z7984 Long term (current) use of oral hypoglycemic drugs: Secondary | ICD-10-CM | POA: Insufficient documentation

## 2022-05-06 MED ORDER — NAPROXEN 500 MG PO TABS
500.0000 mg | ORAL_TABLET | Freq: Two times a day (BID) | ORAL | 0 refills | Status: DC
  Filled 2022-05-06: qty 30, 15d supply, fill #0

## 2022-05-06 NOTE — Discharge Instructions (Addendum)
Wear supportive shoe.  Use crutches and weight-bear as tolerated.  Apply ice for 2 minutes at a time elevate your foot.  Take naproxen as prescribed with food for pain.  Follow-up with your primary care provider.

## 2022-05-06 NOTE — ED Triage Notes (Addendum)
Patient BIB POV c/o left foot pain 9/10, swelling in her left foot, and she is barely able to walk on it, started 2 days ago. Has not seen her primary as she does not have an appt until the end of the month. Denies any injury to the foot or hx of blood clot. Patient took some ibuprofen '600mg'$  around 1400.

## 2022-05-06 NOTE — ED Provider Notes (Signed)
[facility] DEPT Provider Note   CSN: [id number] Arrival date & time: 05/06/22  1555     History  Chief Complaint  Patient presents with   Foot Swelling   unable to walk on left foot    Sydney Williams is a 56 y.o. female.  56 year old female presents with complaint of pain in her left foot with swelling.  Notes that her foot was sore for the past 2 days however when she woke up today had increasing pain and swelling.  Pain is worse with bearing weight on her foot.  Patient has taken ibuprofen prior to travel.  She denies any falls or injuries.  No history of gout.  Denies any other joint pain or swelling, fever.  No other complaints or concerns.       Home Medications Prior to Admission medications   Medication Sig Start Date End Date Taking? Authorizing Provider  naproxen (NAPROSYN) 500 MG tablet Take 1 tablet (500 mg total) by mouth 2 (two) times daily. 05/06/22  Yes Tacy Learn, PA-C  acetaminophen (TYLENOL) 500 MG tablet Take 1,000 mg by mouth every 6 (six) hours as needed for moderate pain.    [provider]  acyclovir (ZOVIRAX) 200 MG capsule TAKE 1 CAPSULE (200 MG TOTAL) BY MOUTH 2 (TWO) TIMES DAILY. 01/25/22 01/25/23  Charlott Rakes, MD  albuterol (VENTOLIN HFA) 108 (90 Base) MCG/ACT inhaler INHALE 1-2 PUFFS INTO THE LUNGS EVERY 6 (SIX) HOURS AS NEEDED FOR WHEEZING OR SHORTNESS OF BREATH. 08/31/20   Elsie Stain, MD  amLODipine (NORVASC) 10 MG tablet TAKE 1 TABLET (10 MG TOTAL) BY MOUTH DAILY. TO LOWER BLOOD PRESSURE 01/15/22 01/15/23  Elsie Stain, MD  ascorbic acid (VITAMIN C) 500 MG tablet Take 500 mg by mouth daily.    [provider]  aspirin (EQ ASPIRIN ADULT LOW DOSE) 81 MG EC tablet Take 1 tablet (81 mg total) by mouth daily. Swallow whole. 02/03/18   Tysinger, Camelia Eng, PA-C  atorvastatin (LIPITOR) 40 MG tablet TAKE 1 TABLET (40 MG TOTAL) BY MOUTH DAILY. 01/15/22 01/15/23  Elsie Stain, MD  chlorthalidone  (HYGROTEN) 15 MG tablet Take 1 tablet (15 mg total) by mouth daily. 11/16/21   Elsie Stain, MD  clotrimazole (LOTRIMIN) 1 % external solution Apply 1 application. topically 2 (two) times daily. To right ear canal Patient not taking: Reported on 04/11/2022 01/15/22   Elsie Stain, MD  Dulaglutide (TRULICITY) 1.5 HG/9.9ME SOPN Inject 1.5 mg into the skin once a week. 05/01/22   Elsie Stain, MD  ferrous sulfate 325 (65 FE) MG tablet Take 325 mg by mouth daily with breakfast.    [provider]  Fluticasone-Umeclidin-Vilant 200-62.5-25 MCG/ACT AEPB Inhale 1 puff into the lungs daily. Rinse mouth after each use. Patient not taking: Reported on 04/11/2022 01/15/22   Elsie Stain, MD  furosemide (LASIX) 20 MG tablet TAKE 1 TABLET (20 MG TOTAL) BY MOUTH DAILY AS NEEDED FOR EDEMA. 06/25/21 06/25/22  Elsie Stain, MD  ibuprofen (ADVIL) 600 MG tablet TAKE 1 TABLET (600 MG TOTAL) BY MOUTH EVERY 8 (EIGHT) HOURS AS NEEDED FOR MODERATE PAIN. TAKE AFTER EATING 03/26/22 03/26/23  Elsie Stain, MD  Insulin Pen Needle 31G X 8 MM MISC Use as directed with insulin 09/11/21   Elsie Stain, MD  metFORMIN (GLUCOPHAGE) 500 MG tablet Take 2 tablets (1,000 mg total) by mouth 2 (two) times daily with a meal. 10/19/21   Argentina Donovan,  PA-C  metoprolol tartrate (LOPRESSOR) 50 MG tablet Take 1 tablet (50 mg total) by mouth 2 (two) times daily. 04/12/22   Elsie Stain, MD  nicotine polacrilex (NICORETTE MINI) 4 MG lozenge Use 3 times daily to stop smoking Patient not taking: Reported on 04/11/2022 09/11/21   Elsie Stain, MD  Vitamin D, Cholecalciferol, 25 MCG (1000 UT) TABS Take 1 tablet by mouth once daily 02/18/19   Tysinger, Camelia Eng, PA-C      Allergies    Lisinopril, Potassium chloride, Pneumococcal vaccines, and Vicodin [hydrocodone-acetaminophen]    Review of Systems   Review of Systems Negative except as per HPI Physical Exam Updated Vital Signs BP 125/80 (BP Location:  Right Arm)   Pulse 88   Temp 98.5 F (36.9 C) (Oral)   Resp 16   Ht '5\' 11"'$  (1.803 m)   Wt 109.8 kg   LMP 06/29/2015 (Exact Date)   SpO2 92%   BMI 33.75 kg/m  Physical Exam Vitals and nursing note reviewed.  Constitutional:      General: She is not in acute distress.    Appearance: She is well-developed. She is not diaphoretic.  HENT:     Head: Normocephalic and atraumatic.  Cardiovascular:     Pulses: Normal pulses.  Pulmonary:     Effort: Pulmonary effort is normal.  Musculoskeletal:        General: Swelling and tenderness present. No deformity.     Comments: Proximal left foot pain with swelling.  No erythema.  DP pulse present, sensation intact.  Pain is worse with range of motion of the left foot.  No pain with palpation of the ankle.  Skin:    General: Skin is warm and dry.     Coloration: Skin is not pale.     Findings: No bruising or erythema.  Neurological:     Mental Status: She is alert and oriented to person, place, and time.     Sensory: No sensory deficit.     Motor: No weakness.     Gait: Gait abnormal.  Psychiatric:        Behavior: Behavior normal.     ED Results / Procedures / Treatments   Labs (all labs ordered are listed, but only abnormal results are displayed) Labs Reviewed - No data to display  EKG None  Radiology DG Foot Complete Left  Result Date: 05/06/2022 CLINICAL DATA:  paina nd swelling for 2 days EXAM: LEFT FOOT - COMPLETE 3+ VIEW COMPARISON:  August 30, 2019 FINDINGS: There is no evidence of fracture or dislocation. As before, again seen are the enthesopathic changes at the fifth metatarsal base. Mild joint space narrowing throughout the midfoot and at the first MTP joint. Small bidirectional calcaneal enthesophytes. Mild diffuse soft tissue swelling at the ankle. IMPRESSION: Mild soft tissue swelling.  No acute osseous abnormality. Electronically Signed   By: Frazier Richards M.D.   On: 05/06/2022 17:20    Procedures Procedures     Medications Ordered in ED Medications - No data to display  ED Course/ Medical Decision Making/ A&P                           Medical Decision Making Amount and/or Complexity of Data Reviewed Radiology: ordered.  Risk Prescription drug management.   56 year old female with complaint of pain and swelling in the left foot.  DP pulse present, sensation intact.  Does have pain with palpation of the proximal left  foot and with range of motion of the left foot.  Gait favors left foot.  X-ray of the left foot as ordered interpreted by me with soft tissue swelling without acute bony injury.  Read by radiology, agree with interpretation with mild joint space narrowing throughout the midfoot and at the first MTP joint.  Small bidirectional calcaneal bone spurs.  Discussed results with patient.  Plan is to place in postop shoe with crutches to weight-bear as tolerated and trial course of NSAIDs.  Recommend recheck with PCP.        Final Clinical Impression(s) / ED Diagnoses Final diagnoses:  Left foot pain    Rx / DC Orders ED Discharge Orders          Ordered    naproxen (NAPROSYN) 500 MG tablet  2 times daily        05/06/22 1737              Tacy Learn, PA-C 05/06/22 2121    Sherwood Gambler, MD 05/06/22 2303

## 2022-05-07 ENCOUNTER — Other Ambulatory Visit: Payer: Self-pay

## 2022-05-13 ENCOUNTER — Telehealth: Payer: Self-pay | Admitting: Pharmacist

## 2022-05-13 ENCOUNTER — Other Ambulatory Visit: Payer: Self-pay

## 2022-05-13 NOTE — Telephone Encounter (Signed)
Patient attempted to be outreached by Park Liter, PharmD Candidate on 05/13/22 to discuss hypertension. Reminded of upcoming [facility] appt on 05/16/22. Left voicemail for patient to return our call at their convenience.   Catie Hedwig Morton, PharmD, [facility]  [phone]

## 2022-05-14 ENCOUNTER — Other Ambulatory Visit: Payer: Self-pay

## 2022-05-15 ENCOUNTER — Telehealth: Payer: Self-pay

## 2022-05-15 NOTE — Telephone Encounter (Signed)
Patient appearing on report for [facility] - Hypertension Control report due to last documented ambulatory blood pressure of 130/90 mmHg on 04/11/2022. Next appointment with PCP is 05/16/2022.   Outreached patient to discuss hypertension control and medication management.   Current antihypertensives: amlodipine 10 mg, metoprolol tartrate 100 mg BID   Patient has an automated upper arm home BP machine.  Current blood pressure readings: Last home reading 118/84 mmHg  Current physical activity: limited due to left foot pain  Patient denies hypotensive signs and symptoms including dizziness, lightheadedness.  Patient denies hypertensive symptoms including headache, chest pain, shortness of breath.  Patient denies side effects related to amlodipine and metoprolol    Assessment/Plan: - Currently controlled.  - Reviewed goal blood pressure <130/80 - Reviewed appropriate administration of medication regimen - Reviewed appropriate home BP monitoring technique (avoid caffeine, smoking, and exercise for 30 minutes before checking, rest for at least 5 minutes before taking BP, sit with feet flat on the floor and back against a hard surface, uncross legs, and rest arm on flat surface) - Discussed goal of 150 minutes of moderate intensity physical activity weekly  Joseph Art, Pharm.D. PGY-2 Ambulatory Care Pharmacy Resident 05/15/2022 10:30 AM

## 2022-05-16 ENCOUNTER — Ambulatory Visit: Payer: 59 | Attending: Critical Care Medicine | Admitting: Critical Care Medicine

## 2022-05-16 ENCOUNTER — Other Ambulatory Visit: Payer: Self-pay

## 2022-05-16 ENCOUNTER — Encounter: Payer: Self-pay | Admitting: Critical Care Medicine

## 2022-05-16 VITALS — BP 127/85 | HR 93 | Temp 97.9°F | Resp 16 | Wt 246.0 lb

## 2022-05-16 DIAGNOSIS — F17210 Nicotine dependence, cigarettes, uncomplicated: Secondary | ICD-10-CM

## 2022-05-16 DIAGNOSIS — Z720 Tobacco use: Secondary | ICD-10-CM

## 2022-05-16 DIAGNOSIS — I10 Essential (primary) hypertension: Secondary | ICD-10-CM | POA: Diagnosis not present

## 2022-05-16 DIAGNOSIS — E119 Type 2 diabetes mellitus without complications: Secondary | ICD-10-CM | POA: Diagnosis not present

## 2022-05-16 DIAGNOSIS — J4540 Moderate persistent asthma, uncomplicated: Secondary | ICD-10-CM | POA: Diagnosis not present

## 2022-05-16 DIAGNOSIS — L309 Dermatitis, unspecified: Secondary | ICD-10-CM

## 2022-05-16 DIAGNOSIS — L84 Corns and callosities: Secondary | ICD-10-CM

## 2022-05-16 DIAGNOSIS — H6241 Otitis externa in other diseases classified elsewhere, right ear: Secondary | ICD-10-CM

## 2022-05-16 DIAGNOSIS — N6001 Solitary cyst of right breast: Secondary | ICD-10-CM

## 2022-05-16 DIAGNOSIS — M0600 Rheumatoid arthritis without rheumatoid factor, unspecified site: Secondary | ICD-10-CM

## 2022-05-16 DIAGNOSIS — B369 Superficial mycosis, unspecified: Secondary | ICD-10-CM

## 2022-05-16 HISTORY — DX: Dermatitis, unspecified: L30.9

## 2022-05-16 LAB — POCT GLYCOSYLATED HEMOGLOBIN (HGB A1C): HbA1c, POC (prediabetic range): 5.8 % (ref 5.7–6.4)

## 2022-05-16 LAB — GLUCOSE, POCT (MANUAL RESULT ENTRY): POC Glucose: 143 mg/dl — AB (ref 70–99)

## 2022-05-16 MED ORDER — AMLODIPINE BESYLATE 10 MG PO TABS
ORAL_TABLET | Freq: Every day | ORAL | 2 refills | Status: DC
  Filled 2022-05-16: qty 30, 30d supply, fill #0
  Filled 2022-06-14: qty 30, 30d supply, fill #1
  Filled 2022-07-15: qty 30, 30d supply, fill #2
  Filled 2022-08-17: qty 30, 30d supply, fill #3
  Filled 2022-09-17: qty 30, 30d supply, fill #4
  Filled 2022-10-20: qty 30, 30d supply, fill #5
  Filled 2022-11-27: qty 30, 30d supply, fill #6

## 2022-05-16 MED ORDER — CLOTRIMAZOLE 1 % EX SOLN
1.0000 | Freq: Two times a day (BID) | CUTANEOUS | 0 refills | Status: DC
Start: 2022-05-16 — End: 2022-12-26

## 2022-05-16 MED ORDER — METOPROLOL TARTRATE 50 MG PO TABS
50.0000 mg | ORAL_TABLET | Freq: Two times a day (BID) | ORAL | 2 refills | Status: DC
  Filled 2022-05-16: qty 60, 30d supply, fill #0
  Filled 2022-06-25: qty 60, 30d supply, fill #1
  Filled 2022-08-17 – 2022-08-19 (×2): qty 60, 30d supply, fill #2

## 2022-05-16 MED ORDER — CLOBETASOL PROPIONATE 0.05 % EX CREA: 1.0000 | TOPICAL_CREAM | Freq: Two times a day (BID) | CUTANEOUS | 0 refills | Status: DC

## 2022-05-16 MED ORDER — ATORVASTATIN CALCIUM 40 MG PO TABS
ORAL_TABLET | Freq: Every day | ORAL | 2 refills | Status: DC
  Filled 2022-05-16: qty 90, fill #0
  Filled 2022-05-28: qty 90, 90d supply, fill #0
  Filled 2022-08-22: qty 90, 90d supply, fill #1
  Filled 2022-11-27: qty 90, 90d supply, fill #2

## 2022-05-16 MED ORDER — ALBUTEROL SULFATE HFA 108 (90 BASE) MCG/ACT IN AERS
1.0000 | INHALATION_SPRAY | Freq: Four times a day (QID) | RESPIRATORY_TRACT | 0 refills | Status: DC | PRN
  Filled 2022-05-16: qty 18, 25d supply, fill #0

## 2022-05-16 MED ORDER — CHLORTHALIDONE 15 MG PO TABS
15.0000 mg | ORAL_TABLET | Freq: Every day | ORAL | 1 refills | Status: DC
  Filled 2022-05-16: qty 30, 30d supply, fill #0
  Filled 2022-06-20: qty 30, 30d supply, fill #1
  Filled 2022-07-21: qty 30, 30d supply, fill #2
  Filled 2022-08-22: qty 30, 30d supply, fill #3
  Filled 2022-09-23: qty 30, 30d supply, fill #4
  Filled 2022-10-20 – 2022-10-23 (×2): qty 30, 30d supply, fill #5

## 2022-05-16 MED ORDER — METFORMIN HCL 500 MG PO TABS
1000.0000 mg | ORAL_TABLET | Freq: Two times a day (BID) | ORAL | 1 refills | Status: DC
  Filled 2022-05-16: qty 120, 30d supply, fill #0
  Filled 2022-07-21: qty 120, 30d supply, fill #1
  Filled 2022-09-25: qty 120, 30d supply, fill #2
  Filled 2022-11-20: qty 120, 30d supply, fill #3

## 2022-05-16 MED ORDER — FLUTICASONE-UMECLIDIN-VILANT 200-62.5-25 MCG/ACT IN AEPB
1.0000 | INHALATION_SPRAY | Freq: Every day | RESPIRATORY_TRACT | 4 refills | Status: DC
  Filled 2022-05-16: qty 60, 30d supply, fill #0
  Filled 2022-06-25: qty 60, 30d supply, fill #1
  Filled 2022-08-27: qty 180, 90d supply, fill #2
  Filled 2022-09-06 – 2022-09-23 (×2): qty 60, 30d supply, fill #2
  Filled 2022-12-02: qty 60, 30d supply, fill #3

## 2022-05-16 NOTE — Assessment & Plan Note (Signed)
Patient unable to obtain clotrimazole cream sent at previous visit Resent today

## 2022-05-16 NOTE — Assessment & Plan Note (Signed)
Base of thumb on the dorsal aspects of hands/wrists bilaterally Recommend she switch to non latex gloves at her job Clobetasol sent to pharmacy today

## 2022-05-16 NOTE — Assessment & Plan Note (Signed)
Well controlled Continue daily fluticasone-umeclinid-vilant inhaler and albuterol as needed Refills sent

## 2022-05-16 NOTE — Patient Instructions (Addendum)
Get your nicotine lozenge at [facility]  The clotrimazole lotion for your right ear was sent to the [place] steroid cream and apply it to the skin moisturizer to your hands twice daily this was sent to [facility]  All other medications were sent refills were sent to our pharmacy no changes  Today urine microalbumin will be the only lab you need  Return to Dr. Joya Gaskins 5 months  Foot doctor referral will be made to [facility] for your callus of your left foot

## 2022-05-16 NOTE — Assessment & Plan Note (Signed)
Doing well on daily amlodipine, metoprolol and chlorthalidone  Recommend she continue, refills sent today  Continue diet and lifestyle changes, patient is doing exceptionally well with them

## 2022-05-16 NOTE — Progress Notes (Signed)
F/u DM  Reoccurring rash on both hand in same place

## 2022-05-16 NOTE — Assessment & Plan Note (Signed)
  .   Current smoking consumption amount: 1-3 cigarette a day  Dicsussion on advise to quit smoking and smoking impacts: Cardiovascular impact  Patient's willingness to quit: Wants to quit  Methods to quit smoking discussed: Behavioral modification and nicotine replacement  Medication management of smoking session drugs discussed: Nicotine replacement  Resources provided:  AVS   Setting quit date not established  Follow-up arranged 2 months   Time spent counseling the patient: 5 minutes   

## 2022-05-16 NOTE — Assessment & Plan Note (Addendum)
Hemoglobin A1C = 5.7% today, remarkable improvement Recommend she continue daily Trulicity and Metformin

## 2022-05-16 NOTE — Assessment & Plan Note (Addendum)
04/11/2022 right breast ultrasound findings showed a 0.3 x 0.1 x 0.4 benign cyst Will observe for now and monitor yearly

## 2022-05-16 NOTE — Assessment & Plan Note (Signed)
Persistent issue  Referred to podiatry

## 2022-05-16 NOTE — Progress Notes (Signed)
Established Patient Office Visit  Subjective   Patient ID: Sydney Williams, female    DOB: [birth year]  Age: 56 y.o. MRN: [id number]  Chief Complaint  Patient presents with   Rash   Diabetes    Sydney Williams is a 56 year old female who presents today for follow up. Her medical history includes hypertension, asthma, type 2 diabetes, and tobacco use.  Last seen in March, she had lowered her Hemoglobin A1C significantly (12.2% to 6.5%). She was taken off insulin and now is using Metformin and Trulicity daily. Today she is even lower with an Hemoglobin A1C of 5.7% She has been keeping up with her diet, and reports only a few cheat meals.  When she was seen in March we ordered a mammogram screen which showed some questionable findings in the right breast, the left was clear. She then had a follow up right breast ultrasound study (04/11/2022) and was found to have a 0.3 x 0.1 x 0.4 cm benign cyst.   At that same March visit,  Sydney Williams was experiencing some ear itchiness and was found to have a fungal excoriation on the right outer ear. Clotrimazole cream was sent to pharmacy but she was unable to obtain because of stocking issues.  She now is also reporting itchy bumps just below her thumbs on the dorsal aspect of her hands bilaterally. These bumps come and go and do not pop up elsewhere on her body. She believes this may be due to how long she has her latex gloves on at work.   Blood pressure today is 127/85. She is taking amlodipine, metoprolol and chlorthalidone daily.   Denies any recent shortness of breath. She is using fluticasone-umeclinid-vilant inhaler every morning. States she has not had to use the albuterol in a long time.  She is currently still smoking, about 3-5 cigarettes a day. At our last appointment I tried to send in nicotine lozenges however she does not have insurance and was not covered for these. She is still interested in trying to obtain them.   She was also since seen in the  Emergency department twice for a right broken toe (01/2022) and left foot calcaneous spurs (04/2022). Reports her feet frequently give her issues on and off, including pain and swelling.     Patient Active Problem List   Diagnosis Date Noted   Foot callus 05/16/2022   Breast cyst, right 05/16/2022   Eczema 05/16/2022   Aortic atherosclerosis ([place]) 11/15/2021   Otomycosis of right ear 07/04/2021   Otitis externa 07/04/2021   Degenerative disc disease, lumbar    History of lumbar spinal fusion 10/24/2020   Seasonal and perennial allergic rhinoconjunctivitis 09/18/2020   Carpal tunnel syndrome on right    Seronegative rheumatoid arthritis ([place]) 08/04/2020   Allergic conjunctivitis of both eyes 07/19/2020   Lumbar radiculopathy, chronic 04/03/2020   Elevated blood uric acid level 04/03/2020   Family history of cervical cancer 02/02/2018   Family history of breast cancer 02/02/2018   Vitamin D deficiency 02/02/2018   Depression 06/19/2017   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 01/08/2017   Snoring 09/11/2016   History of migraine 09/11/2016   Frequent headaches 09/11/2016   Gastroesophageal reflux disease without esophagitis 06/01/2015   Other allergic rhinitis 06/01/2015   Tobacco user 11/30/2014   Essential hypertension 11/30/2014   Hyperlipidemia 11/30/2014   Asthma, moderate persistent 11/30/2014   Obesity 07/02/2012   Type 2 diabetes mellitus without complication, without long-term current use of insulin ([place]) 07/02/2012  Past Medical History:  Diagnosis Date   Asthma    2015 hospitaliation for asthma   Diabetes mellitus    Type II   Eczema 05/16/2022   Family history of breast cancer    Fibroids 2010   Genital herpes    Genital herpes 11/30/2014   GERD (gastroesophageal reflux disease)    Hyperlipidemia    Hypertension    Impaired fasting blood sugar    Migraines    Obesity    Right carpal tunnel syndrome    Smoker    Past Surgical History:  Procedure  Laterality Date   BACK SURGERY  10/24/2020   CARDIAC CATHETERIZATION     CARPAL TUNNEL RELEASE Right 08/30/2020   Procedure: RIGHT CARPAL TUNNEL RELEASE;  Surgeon: Leandrew Koyanagi, MD;  Location: [place];  Service: Orthopedics;  Laterality: Right;   COLONOSCOPY  03/13/2016   Dr. Carlean Purl, normal, repeat 2027   CYST EXCISION     left neck/postauricular region, benign   DILATION AND CURETTAGE OF UTERUS     LAPAROSCOPIC ABDOMINAL EXPLORATION     removal of ectopic preg   LEFT HEART CATH AND CORONARY ANGIOGRAPHY N/A 06/23/2017   Procedure: LEFT HEART CATH AND CORONARY ANGIOGRAPHY;  Surgeon: Nelva Bush, MD;  Location: [facility];  Service: Cardiovascular;  Laterality: N/A;   TUBAL LIGATION     Social History   Tobacco Use   Smoking status: Every Day    Packs/day: 0.25    Years: 14.00    Total pack years: 3.50    Types: Cigarettes   Smokeless tobacco: Never  Vaping Use   Vaping Use: Never used  Substance Use Topics   Alcohol use: Yes    Comment: occ   Drug use: No   Family History  Problem Relation Age of Onset   Diabetes Mother    Hypertension Mother    Aneurysm Mother    Stroke Mother    Cancer Mother        cervical cancer   Breast cancer Maternal Aunt    Cancer Maternal Aunt        breast   Cancer Cousin        breast/breast   Lupus Cousin    Heart disease Neg Hx    Colon cancer Neg Hx    Allergic rhinitis Neg Hx    Angioedema Neg Hx    Asthma Neg Hx    Atopy Neg Hx    Eczema Neg Hx    Immunodeficiency Neg Hx    Urticaria Neg Hx    Allergies  Allergen Reactions   Lisinopril Swelling   Potassium Chloride Shortness Of Breath and Swelling    Tolerates IV KCl, reaction only to PO product   Pneumococcal Vaccines Swelling and Other (See Comments)    Reaction:  Swelling at injection site   Vicodin [Hydrocodone-Acetaminophen] Itching and Rash      Review of Systems  Constitutional: Negative.   HENT:         Right ear itching   Eyes: Negative.   Respiratory: Negative.    Cardiovascular: Negative.   Gastrointestinal: Negative.   Genitourinary: Negative.   Musculoskeletal:        Intermittent bilateral foot pain and swelling  Skin:  Positive for itching and rash.  Neurological: Negative.   Endo/Heme/Allergies: Negative.   Psychiatric/Behavioral: Negative.        Objective:     BP 127/85 (BP Location: Left Arm, Patient Position: Sitting, Cuff Size: Large)  Pulse 93   Temp 97.9 F (36.6 C)   Resp 16   Wt 246 lb (111.6 kg)   LMP 06/29/2015 (Exact Date)   SpO2 97%   BMI 34.31 kg/m     Physical Exam Constitutional:      Appearance: Normal appearance.  HENT:     Head: Normocephalic.     Right Ear: Tympanic membrane, ear canal and external ear normal.     Left Ear: Tympanic membrane and ear canal normal.     Ears:     Comments: Left outer ear, about 2 cm area of excoriation     Nose: Nose normal.     Mouth/Throat:     Mouth: Mucous membranes are moist.     Pharynx: Oropharynx is clear.  Eyes:     Conjunctiva/sclera: Conjunctivae normal.  Cardiovascular:     Rate and Rhythm: Normal rate and regular rhythm.     Pulses: Normal pulses.     Heart sounds: Normal heart sounds.  Pulmonary:     Effort: Pulmonary effort is normal.     Breath sounds: Normal breath sounds.  Abdominal:     General: Bowel sounds are normal.     Palpations: Abdomen is soft.  Musculoskeletal:     Comments: Left plantar callus   Skin:    Comments: Bilateral bases of thumb (dorsal) with multiple erythematous small papules, no drainage/ulceration   Neurological:     Mental Status: She is alert and oriented to person, place, and time.  Psychiatric:        Mood and Affect: Mood normal.        Behavior: Behavior normal.      Results for orders placed or performed in visit on 05/16/22  Glucose (CBG)  Result Value Ref Range   POC Glucose 143 (A) 70 - 99 mg/dl  HgB A1c  Result Value Ref Range   Hemoglobin A1C      HbA1c POC (<> result, manual entry)     HbA1c, POC (prediabetic range) 5.8 5.7 - 6.4 %   HbA1c, POC (controlled diabetic range)         The 10-year ASCVD risk score (Arnett DK, et al., 2019) is: 24%    Assessment & Plan:   Problem List Items Addressed This Visit       Cardiovascular and Mediastinum   Essential hypertension    Doing well on daily amlodipine, metoprolol and chlorthalidone  Recommend she continue, refills sent today  Continue diet and lifestyle changes, patient is doing exceptionally well with them       Relevant Medications   amLODipine (NORVASC) 10 MG tablet   atorvastatin (LIPITOR) 40 MG tablet   chlorthalidone (HYGROTEN) 15 MG tablet   metoprolol tartrate (LOPRESSOR) 50 MG tablet     Respiratory   Asthma, moderate persistent    Well controlled Continue daily fluticasone-umeclinid-vilant inhaler and albuterol as needed Refills sent       Relevant Medications   albuterol (VENTOLIN HFA) 108 (90 Base) MCG/ACT inhaler   Fluticasone-Umeclidin-Vilant 200-62.5-25 MCG/ACT AEPB     Endocrine   Type 2 diabetes mellitus without complication, without long-term current use of insulin (HCC) - Primary    Hemoglobin A1C = 5.7% today, remarkable improvement Recommend she continue daily Trulicity and Metformin       Relevant Medications   atorvastatin (LIPITOR) 40 MG tablet   metFORMIN (GLUCOPHAGE) 500 MG tablet   Other Relevant Orders   Urine microalbumin-creatinine with uACR   Glucose (CBG) (Completed)  HgB A1c (Completed)   Ambulatory referral to Podiatry     Nervous and Auditory   Otomycosis of right ear    Patient unable to obtain clotrimazole cream sent at previous visit Resent today       Relevant Medications   clotrimazole (LOTRIMIN) 1 % external solution     Musculoskeletal and Integument   Seronegative rheumatoid arthritis ([place])   Foot callus    Persistent issue  Referred to podiatry        Relevant Orders   Ambulatory referral to  Podiatry   Eczema    Base of thumb on the dorsal aspects of hands/wrists bilaterally Recommend she switch to non latex gloves at her job Clobetasol sent to pharmacy today         Other   Tobacco user       Current smoking consumption amount: 1-3 cigarette a day  Dicsussion on advise to quit smoking and smoking impacts: Cardiovascular impact  Patient's willingness to quit: Wants to quit  Methods to quit smoking discussed: Behavioral modification and nicotine replacement  Medication management of smoking session drugs discussed: Nicotine replacement  Resources provided:  AVS   Setting quit date not established  Follow-up arranged 2 months   Time spent counseling the patient: 5 minutes        Breast cyst, right    04/11/2022 right breast ultrasound findings showed a 0.3 x 0.1 x 0.4 benign cyst Will observe for now and monitor yearly        Return in about 5 months (around 10/16/2022).    Asencion Noble, MD

## 2022-05-17 ENCOUNTER — Other Ambulatory Visit: Payer: Self-pay

## 2022-05-17 LAB — MICROALBUMIN / CREATININE URINE RATIO
Creatinine, Urine: 166.5 mg/dL
Microalb/Creat Ratio: 13 mg/g creat (ref 0–29)
Microalbumin, Urine: 21.3 ug/mL

## 2022-05-19 NOTE — Progress Notes (Signed)
Let pt know urine for albumin as normal no kidney damage from diabetes

## 2022-05-20 ENCOUNTER — Telehealth: Payer: Self-pay

## 2022-05-20 NOTE — Telephone Encounter (Signed)
-----   Message from Elsie Stain, MD sent at 05/19/2022 12:11 PM EDT ----- Let pt know urine for albumin as normal no kidney damage from diabetes

## 2022-05-20 NOTE — Telephone Encounter (Signed)
Pt was called and is aware of results, DOB was confirmed.  ?

## 2022-05-28 ENCOUNTER — Other Ambulatory Visit: Payer: Self-pay

## 2022-05-28 ENCOUNTER — Other Ambulatory Visit: Payer: Self-pay | Admitting: Family Medicine

## 2022-05-29 ENCOUNTER — Other Ambulatory Visit: Payer: Self-pay

## 2022-05-29 MED ORDER — ACYCLOVIR 200 MG PO CAPS
ORAL_CAPSULE | ORAL | 0 refills | Status: DC
  Filled 2022-05-29: qty 60, 30d supply, fill #0
  Filled 2022-06-25: qty 60, 30d supply, fill #1
  Filled 2022-08-13: qty 60, 30d supply, fill #2

## 2022-05-30 ENCOUNTER — Other Ambulatory Visit: Payer: Self-pay

## 2022-05-31 ENCOUNTER — Other Ambulatory Visit: Payer: Self-pay

## 2022-06-10 ENCOUNTER — Ambulatory Visit: Payer: Self-pay | Admitting: *Deleted

## 2022-06-10 NOTE — Telephone Encounter (Signed)
  Chief Complaint: Toe Pain Symptoms: Left great toe and right foot 5th toe, painful 5/10, mostly "When something is touching it." Able to wear shoes, walk. Some swelling "But my feet swell all the time." Frequency: 4 days Pertinent Negatives: Patient denies redness, warmth,fever Disposition: '[]'$ ED /'[]'$ Urgent Care (no appt availability in office) / '[]'$ Appointment(In office/virtual)/ '[]'$  [facility] [facility]/ '[]'$ Home Care/ '[]'$ Refused Recommended Disposition /'[x]'$ [place] [facility]/ '[]'$  Follow-up with PCP Additional Notes: No appts available in protocol timeframe. [facility]. States will follow disposition. Care advise provided. Reason for Disposition  Pain in the big toe joint  Answer Assessment - Initial Assessment Questions 1. ONSET: "When did the pain start?"      4 days ago 2. LOCATION: "Where is the pain located?"   (e.g., around nail, entire toe, at foot joint)      Left foot great toe, right foot 5th toe 3. PAIN: "How bad is the pain?"    (Scale 1-10; or mild, moderate, severe)   -  MILD (1-3): doesn't interfere with normal activities    -  MODERATE (4-7): interferes with normal activities (e.g., work or school) or awakens from sleep, limping    -  SEVERE (8-10): excruciating pain, unable to do any normal activities, unable to walk     8/10 4. APPEARANCE: "What does the toe look like?" (e.g., redness, swelling, bruising, pallor)     no 5. CAUSE: "What do you think is causing the toe pain?"     Usure 6. OTHER SYMPTOMS: "Do you have any other symptoms?" (e.g., leg pain, rash, fever, numbness)     No  Protocols used: Toe Pain-A-AH

## 2022-06-14 ENCOUNTER — Other Ambulatory Visit: Payer: Self-pay

## 2022-06-19 ENCOUNTER — Other Ambulatory Visit: Payer: Self-pay

## 2022-06-21 ENCOUNTER — Other Ambulatory Visit: Payer: Self-pay

## 2022-06-24 ENCOUNTER — Other Ambulatory Visit: Payer: Self-pay

## 2022-06-25 ENCOUNTER — Other Ambulatory Visit: Payer: Self-pay

## 2022-06-26 ENCOUNTER — Other Ambulatory Visit: Payer: Self-pay

## 2022-06-28 ENCOUNTER — Other Ambulatory Visit: Payer: Self-pay

## 2022-07-02 ENCOUNTER — Ambulatory Visit: Payer: Self-pay

## 2022-07-02 NOTE — Telephone Encounter (Signed)
Chief Complaint: right foot and ankle swelling Symptoms: severe pain Frequency: onset Saturday Pertinent Negatives: Patient denies redness, fever Disposition: '[]'$ ED /'[]'$ Urgent Care (no appt availability in office) / '[]'$ Appointment(In office/virtual)/ '[]'$  [facility]/ '[]'$ Home Care/ '[]'$ Refused Recommended Disposition /'[x]'$ [place] [facility]/ '[]'$  Follow-up with PCP Additional Notes: n/a Reason for Disposition  [1] Thigh, calf, or ankle swelling AND [2] only 1 side  MILD or MODERATE ankle swelling (e.g., can't move joint normally, can't do usual activities) (Exceptions: Itchy, localized swelling; swelling is chronic.)  Answer Assessment - Initial Assessment Questions 1. ONSET: "When did the swelling start?" (e.g., minutes, hours, days)     Saturday 2. LOCATION: "What part of the leg is swollen?"  "Are both legs swollen or just one leg?"     Foot and ankle 3. SEVERITY: "How bad is the swelling?" (e.g., localized; mild, moderate, severe)   - Localized: Small area of swelling localized to one leg.   - MILD pedal edema: Swelling limited to foot and ankle, pitting edema < 1/4 inch (6 mm) deep, rest and elevation eliminate most or all swelling.   - MODERATE edema: Swelling of lower leg to knee, pitting edema > 1/4 inch (6 mm) deep, rest and elevation only partially reduce swelling.   - SEVERE edema: Swelling extends above knee, facial or hand swelling present.      mild 4. REDNESS: "Does the swelling look red or infected?"     no 5. PAIN: "Is the swelling painful to touch?" If Yes, ask: "How painful is it?"   (Scale 1-10; mild, moderate or severe)     Yes- 05/04/09 6. FEVER: "Do you have a fever?" If Yes, ask: "What is it, how was it measured, and when did it start?"      no 7. CAUSE: "What do you think is causing the leg swelling?"     no 8. MEDICAL HISTORY: "Do you have a history of blood clots (e.g., DVT), cancer, heart failure, kidney disease, or liver failure?"     no 9. RECURRENT  SYMPTOM: "Have you had leg swelling before?" If Yes, ask: "When was the last time?" "What happened that time?"     Yes-to ankle only- took diuretics 10. OTHER SYMPTOMS: "Do you have any other symptoms?" (e.g., chest pain, difficulty breathing)       no 11. PREGNANCY: "Is there any chance you are pregnant?" "When was your last menstrual period?"       N/a  Answer Assessment - Initial Assessment Questions 1. LOCATION: "Which ankle is swollen?" "Where is the swelling?"     Right- right ankle and foot 2. ONSET: "When did the swelling start?"     Saturday 3. SWELLING: "How bad is the swelling?" Or, "How large is it?" (e.g., mild, moderate, severe; size of localized swelling)    - NONE: No joint swelling.   - LOCALIZED: Localized; small area of puffy or swollen skin (e.g., insect bite, skin irritation).   - MILD: Joint looks or feels mildly swollen or puffy.   - MODERATE: Swollen; interferes with normal activities (e.g., work or school); decreased range of movement; may be limping.   - SEVERE: Very swollen; can't move swollen joint at all; limping a lot or unable to walk.     Moderate to severe 4. PAIN: "Is there any pain?" If Yes, ask: "How bad is it?" (Scale 1-10; or mild, moderate, severe)   - NONE (0): no pain.   - MILD (1-3): doesn't interfere with normal activities.    -  MODERATE (4-7): interferes with normal activities (e.g., work or school) or awakens from sleep, limping.    - SEVERE (8-10): excruciating pain, unable to do any normal activities, unable to walk.      Mod to severe 5. CAUSE: "What do you think caused the ankle swelling?"     unknown 6. OTHER SYMPTOMS: "Do you have any other symptoms?" (e.g., fever, chest pain, difficulty breathing, calf pain)     no 7. PREGNANCY: "Is there any chance you are pregnant?" "When was your last menstrual period?"     N/a  Protocols used: Leg Swelling and Edema-A-AH, Ankle Swelling-A-AH

## 2022-07-03 ENCOUNTER — Ambulatory Visit: Payer: Self-pay | Admitting: Physician Assistant

## 2022-07-03 ENCOUNTER — Encounter: Payer: Self-pay | Admitting: Physician Assistant

## 2022-07-03 ENCOUNTER — Other Ambulatory Visit: Payer: Self-pay

## 2022-07-03 VITALS — BP 128/90 | HR 79 | Ht 72.0 in | Wt 243.0 lb

## 2022-07-03 DIAGNOSIS — M109 Gout, unspecified: Secondary | ICD-10-CM

## 2022-07-03 MED ORDER — PREDNISONE 20 MG PO TABS
40.0000 mg | ORAL_TABLET | Freq: Every day | ORAL | 0 refills | Status: AC
  Filled 2022-07-03: qty 10, 5d supply, fill #0

## 2022-07-03 MED ORDER — INDOMETHACIN 50 MG PO CAPS
50.0000 mg | ORAL_CAPSULE | Freq: Three times a day (TID) | ORAL | 0 refills | Status: DC
  Filled 2022-07-03: qty 30, 10d supply, fill #0

## 2022-07-03 NOTE — Progress Notes (Signed)
Established Patient Office Visit  Subjective   Patient ID: Sydney Williams, female    DOB: 1966/04/02  Age: 56 y.o. MRN: [id number]  Chief Complaint  Patient presents with   Toe Pain    States that she has been having exquisite pain in her right great toe starting on Saturday, approximately 5 days ago, states that the pain has been radiating to her ankle and calf.  States that she has had some swelling.  States that it is very tender to the touch, states that she is unable to lay a sheet over her foot.  States that she has taken her Lasix, ibuprofen and Tylenol without relief.  Does endorse that she used to take allopurinol, but does not recall having previous episodes of gout.    Denies injury or trauma, numbness or tingling.      Past Medical History:  Diagnosis Date   Asthma    2015 hospitaliation for asthma   Diabetes mellitus    Type II   Eczema 05/16/2022   Family history of breast cancer    Fibroids 2010   Genital herpes    Genital herpes 11/30/2014   GERD (gastroesophageal reflux disease)    Hyperlipidemia    Hypertension    Impaired fasting blood sugar    Migraines    Obesity    Right carpal tunnel syndrome    Smoker    Social History   Socioeconomic History   Marital status: Legally Separated    Spouse name: Not on file   Number of children: Not on file   Years of education: Not on file   Highest education level: Not on file  Occupational History   Not on file  Tobacco Use   Smoking status: Every Day    Packs/day: 0.25    Years: 14.00    Total pack years: 3.50    Types: Cigarettes   Smokeless tobacco: Never  Vaping Use   Vaping Use: Never used  Substance and Sexual Activity   Alcohol use: Yes    Comment: occ   Drug use: No   Sexual activity: Yes    Birth control/protection: Surgical  Other Topics Concern   Not on file  Social History Narrative   Married, and her granddaughter lives with her, works as Quarry manager at [employer], exercise - activity at  work, walking.  12/2016   Social Determinants of Health   Financial Resource Strain: Not on file  Food Insecurity: No Food Insecurity (04/11/2022)   Hunger Vital Sign    Worried About Running Out of Food in the Last Year: Never true    Ran Out of Food in the Last Year: Never true  Transportation Needs: No Transportation Needs (04/11/2022)   PRAPARE - Hydrologist (Medical): No    Lack of Transportation (Non-Medical): No  Physical Activity: Not on file  Stress: Not on file  Social Connections: Not on file  Intimate Partner Violence: Not on file   Family History  Problem Relation Age of Onset   Diabetes Mother    Hypertension Mother    Aneurysm Mother    Stroke Mother    Cancer Mother        cervical cancer   Breast cancer Maternal Aunt    Cancer Maternal Aunt        breast   Cancer Cousin        breast/breast   Lupus Cousin    Heart disease Neg Hx  Colon cancer Neg Hx    Allergic rhinitis Neg Hx    Angioedema Neg Hx    Asthma Neg Hx    Atopy Neg Hx    Eczema Neg Hx    Immunodeficiency Neg Hx    Urticaria Neg Hx    Allergies  Allergen Reactions   Lisinopril Swelling   Potassium Chloride Shortness Of Breath and Swelling    Tolerates IV KCl, reaction only to PO product   Pneumococcal Vaccines Swelling and Other (See Comments)    Reaction:  Swelling at injection site   Vicodin [Hydrocodone-Acetaminophen] Itching and Rash    Review of Systems  Constitutional:  Negative for chills and fever.  HENT: Negative.    Eyes: Negative.   Respiratory:  Negative for shortness of breath.   Cardiovascular:  Negative for chest pain.  Gastrointestinal:  Negative for nausea and vomiting.  Genitourinary:  Negative for dysuria.  Musculoskeletal:  Positive for joint pain.  Skin: Negative.   Neurological: Negative.   Endo/Heme/Allergies: Negative.   Psychiatric/Behavioral: Negative.        Objective:     BP (!) 128/90 (BP Location: Right Arm)    Pulse 79   Ht 6' (1.829 m)   Wt 243 lb (110.2 kg)   LMP 06/29/2015 (Exact Date)   BMI 32.96 kg/m  BP Readings from Last 3 Encounters:  07/03/22 (!) 128/90  05/16/22 127/85  05/06/22 125/80   Wt Readings from Last 3 Encounters:  07/03/22 243 lb (110.2 kg)  05/16/22 246 lb (111.6 kg)  05/06/22 242 lb (109.8 kg)      Physical Exam Vitals reviewed.  Constitutional:      Appearance: Normal appearance.  HENT:     Head: Normocephalic and atraumatic.     Right Ear: External ear normal.     Left Ear: External ear normal.     Nose: Nose normal.     Mouth/Throat:     Mouth: Mucous membranes are moist.     Pharynx: Oropharynx is clear.  Eyes:     Extraocular Movements: Extraocular movements intact.     Conjunctiva/sclera: Conjunctivae normal.     Pupils: Pupils are equal, round, and reactive to light.  Cardiovascular:     Rate and Rhythm: Normal rate and regular rhythm.     Pulses: Normal pulses.          Dorsalis pedis pulses are 2+ on the right side and 2+ on the left side.       Posterior tibial pulses are 2+ on the right side and 2+ on the left side.     Heart sounds: Normal heart sounds.  Pulmonary:     Effort: Pulmonary effort is normal.     Breath sounds: Normal breath sounds.  Musculoskeletal:     Cervical back: Normal range of motion and neck supple.     Right lower leg: Edema present.  Feet:     Right foot:     Skin integrity: Warmth present. No erythema.     Comments: Tender to very light touch near right great toe, edema noted right foot and ankle; negative Homan sign Skin:    General: Skin is warm.  Neurological:     General: No focal deficit present.     Mental Status: She is alert and oriented to person, place, and time.  Psychiatric:        Mood and Affect: Mood normal.        Behavior: Behavior normal.  Thought Content: Thought content normal.        Judgment: Judgment normal.        Assessment & Plan:   Problem List Items Addressed This  Visit   None Visit Diagnoses     Acute gout involving toe of right foot, unspecified cause    -  Primary   Relevant Medications   predniSONE (DELTASONE) 20 MG tablet   indomethacin (INDOCIN) 50 MG capsule     1. Acute gout involving toe of right foot, unspecified cause Trial prednisone, indomethacin.  Patient education given on supportive care.  Patient encouraged to follow-up with mobile unit in approximately 2 weeks.  Patient understands and agrees.  Red flags given for prompt reevaluation. - predniSONE (DELTASONE) 20 MG tablet; Take 2 tablets (40 mg total) by mouth daily with breakfast for 5 days.  Dispense: 10 tablet; Refill: 0 - indomethacin (INDOCIN) 50 MG capsule; Take 1 capsule (50 mg total) by mouth 3 (three) times daily with meals. Continue for 2-3 days after pain resolves  Dispense: 30 capsule; Refill: 0   I have reviewed the patient's medical history (PMH, PSH, Social History, Family History, Medications, and allergies) , and have been updated if relevant. I spent 30 minutes reviewing chart and  face to face time with patient.    No AVS was created, patient declines my chart, no printer available on mobile screening Lucianne Lei   Return in about 2 weeks (around 07/17/2022) for with MMU.    Loraine Grip Mayers, PA-C

## 2022-07-15 ENCOUNTER — Other Ambulatory Visit: Payer: Self-pay

## 2022-07-15 ENCOUNTER — Telehealth: Payer: Self-pay | Admitting: Critical Care Medicine

## 2022-07-15 ENCOUNTER — Other Ambulatory Visit: Payer: Self-pay | Admitting: Critical Care Medicine

## 2022-07-15 NOTE — Telephone Encounter (Signed)
Patient called in states was told by pharmacy that ibuprofen '600mg'$  has been discountinued and she didn't know anything about this, when requesting a refill. Please call back to discuss

## 2022-07-15 NOTE — Telephone Encounter (Signed)
Call placed to patient. Left HIPAA-compliant VM requesting a return call. Of note, it looks like patient was seen by Carrolyn Meiers 07/03/2022 for an acute gout flare. Indomethacin was sent to replace ibuprofen at that visit.

## 2022-07-16 NOTE — Telephone Encounter (Signed)
Requested medication (s) are due for refill today: {yes, patient request  Requested medication (s) are on the active medication list: no  Last refill:  05/16/22, discontinued  Future visit scheduled: yes  Notes to clinic:  Unable to refill per protocol, Rx expired. Medication was discontinued 05/16/22 by PCP. Pharmacy request:patient thinks she should still be on it...please send in refills if okay.     Requested Prescriptions  Pending Prescriptions Disp Refills   ibuprofen (ADVIL) 600 MG tablet 90 tablet 1    Sig: TAKE 1 TABLET (600 MG TOTAL) BY MOUTH EVERY 8 (EIGHT) HOURS AS NEEDED FOR MODERATE PAIN. TAKE AFTER EATING     Analgesics:  NSAIDS Failed - 07/15/2022 10:45 AM      Failed - Manual Review: Labs are only required if the patient has taken medication for more than 8 weeks.      Failed - HGB in normal range and within 360 days    Hemoglobin  Date Value Ref Range Status  01/30/2021 14.0 12.0 - 15.0 g/dL Final  06/06/2020 13.7 11.1 - 15.9 g/dL Final         Failed - PLT in normal range and within 360 days    Platelets  Date Value Ref Range Status  01/30/2021 289 150 - 400 K/uL Final  06/06/2020 258 150 - 450 x10E3/uL Final         Failed - HCT in normal range and within 360 days    HCT  Date Value Ref Range Status  01/30/2021 43.7 36.0 - 46.0 % Final   Hematocrit  Date Value Ref Range Status  06/06/2020 40.4 34.0 - 46.6 % Final         Passed - Cr in normal range and within 360 days    Creat  Date Value Ref Range Status  08/04/2020 0.52 0.50 - 1.05 mg/dL Final    Comment:    For patients >18 years of age, the reference limit for Creatinine is approximately 13% higher for people identified as African-American. .    Creatinine, Ser  Date Value Ref Range Status  11/15/2021 0.71 0.57 - 1.00 mg/dL Final   Creatinine, Urine  Date Value Ref Range Status  01/08/2017 122 20 - 320 mg/dL Final         Passed - eGFR is 30 or above and within 360 days    GFR,  Est African American  Date Value Ref Range Status  08/04/2020 125 > OR = 60 mL/min/1.28m Final   GFR, Est Non African American  Date Value Ref Range Status  08/04/2020 108 > OR = 60 mL/min/1.760mFinal   GFR, Estimated  Date Value Ref Range Status  01/30/2021 >60 >60 mL/min Final    Comment:    (NOTE) Calculated using the CKD-EPI Creatinine Equation (2021)    eGFR  Date Value Ref Range Status  11/15/2021 100 >59 mL/min/1.73 Final         Passed - Patient is not pregnant      Passed - Valid encounter within last 12 months    Recent Outpatient Visits           2 months ago Type 2 diabetes mellitus without complication, without long-term current use of insulin ([facility]  [place]   6 months ago Type 2 diabetes mellitus without complication, without long-term current use of insulin ([facility]  [place]   6  months ago Type 2 diabetes mellitus without complication, without long-term current use of insulin [facility])   [place], Annie Main L, RPH-CPP   7 months ago Type 2 diabetes mellitus without complication, without long-term current use of insulin [facility])   [place], Annie Main L, RPH-CPP   8 months ago Type 2 diabetes mellitus without complication, without long-term current use of insulin [facility])   [place] [facility] Elsie Stain, MD

## 2022-07-19 ENCOUNTER — Other Ambulatory Visit: Payer: Self-pay

## 2022-07-22 ENCOUNTER — Other Ambulatory Visit: Payer: Self-pay

## 2022-07-23 ENCOUNTER — Other Ambulatory Visit: Payer: Self-pay

## 2022-07-24 ENCOUNTER — Other Ambulatory Visit: Payer: Self-pay | Admitting: Critical Care Medicine

## 2022-07-24 ENCOUNTER — Other Ambulatory Visit: Payer: Self-pay

## 2022-07-24 MED ORDER — TRULICITY 1.5 MG/0.5ML ~~LOC~~ SOAJ
1.5000 mg | SUBCUTANEOUS | 2 refills | Status: DC
Start: 2022-07-24 — End: 2022-10-23
  Filled 2022-07-24: qty 2, 28d supply, fill #0
  Filled 2022-08-26: qty 2, 28d supply, fill #1
  Filled 2022-09-23: qty 2, 28d supply, fill #2

## 2022-07-24 NOTE — Telephone Encounter (Signed)
Requested medication (s) are due for refill today:   Yes  Requested medication (s) are on the active medication list:   Yes  Future visit scheduled:   No  Seen 2 mo. ago   Last ordered: 05/01/2022 2 ml, 2 refills  Returned because no more refills remain on this rx   Requested Prescriptions  Pending Prescriptions Disp Refills   Dulaglutide (TRULICITY) 1.5 IA/1.6PV SOPN 2 mL 2    Sig: Inject 1.5 mg into the skin once a week.     Endocrinology:  Diabetes - GLP-1 Receptor Agonists Passed - 07/24/2022  6:02 AM      Passed - HBA1C is between 0 and 7.9 and within 180 days    HbA1c, POC (prediabetic range)  Date Value Ref Range Status  05/16/2022 5.8 5.7 - 6.4 % Final         Passed - Valid encounter within last 6 months    Recent Outpatient Visits           2 months ago Type 2 diabetes mellitus without complication, without long-term current use of insulin ([place])   [place] Elsie Stain, MD   6 months ago Type 2 diabetes mellitus without complication, without long-term current use of insulin [facility])   [place] Elsie Stain, MD   7 months ago Type 2 diabetes mellitus without complication, without long-term current use of insulin [facility])   [place], Annie Main L, RPH-CPP   7 months ago Type 2 diabetes mellitus without complication, without long-term current use of insulin [facility])   [place], Annie Main L, RPH-CPP   8 months ago Type 2 diabetes mellitus without complication, without long-term current use of insulin [facility])    [facility] Elsie Stain, MD

## 2022-07-26 ENCOUNTER — Other Ambulatory Visit: Payer: Self-pay | Admitting: Critical Care Medicine

## 2022-07-26 ENCOUNTER — Other Ambulatory Visit: Payer: Self-pay

## 2022-07-26 NOTE — Telephone Encounter (Signed)
Requested medication (s) are due for refill today - no  Requested medication (s) are on the active medication list -no  Future visit scheduled -no  Last refill: 03/26/22  Notes to clinic: medication no longer on current medication list- refused previously as alternative Rx has been prescribed  Requested Prescriptions  Pending Prescriptions Disp Refills   ibuprofen (ADVIL) 600 MG tablet 90 tablet 1    Sig: TAKE 1 TABLET (600 MG TOTAL) BY MOUTH EVERY 8 (EIGHT) HOURS AS NEEDED FOR MODERATE PAIN. TAKE AFTER EATING     Analgesics:  NSAIDS Failed - 07/26/2022 11:12 AM      Failed - Manual Review: Labs are only required if the patient has taken medication for more than 8 weeks.      Failed - HGB in normal range and within 360 days    Hemoglobin  Date Value Ref Range Status  01/30/2021 14.0 12.0 - 15.0 g/dL Final  06/06/2020 13.7 11.1 - 15.9 g/dL Final         Failed - PLT in normal range and within 360 days    Platelets  Date Value Ref Range Status  01/30/2021 289 150 - 400 K/uL Final  06/06/2020 258 150 - 450 x10E3/uL Final         Failed - HCT in normal range and within 360 days    HCT  Date Value Ref Range Status  01/30/2021 43.7 36.0 - 46.0 % Final   Hematocrit  Date Value Ref Range Status  06/06/2020 40.4 34.0 - 46.6 % Final         Passed - Cr in normal range and within 360 days    Creat  Date Value Ref Range Status  08/04/2020 0.52 0.50 - 1.05 mg/dL Final    Comment:    For patients >71 years of age, the reference limit for Creatinine is approximately 13% higher for people identified as African-American. .    Creatinine, Ser  Date Value Ref Range Status  11/15/2021 0.71 0.57 - 1.00 mg/dL Final   Creatinine, Urine  Date Value Ref Range Status  01/08/2017 122 20 - 320 mg/dL Final         Passed - eGFR is 30 or above and within 360 days    GFR, Est African American  Date Value Ref Range Status  08/04/2020 125 > OR = 60 mL/min/1.22m Final   GFR, Est Non  African American  Date Value Ref Range Status  08/04/2020 108 > OR = 60 mL/min/1.798mFinal   GFR, Estimated  Date Value Ref Range Status  01/30/2021 >60 >60 mL/min Final    Comment:    (NOTE) Calculated using the CKD-EPI Creatinine Equation (2021)    eGFR  Date Value Ref Range Status  11/15/2021 100 >59 mL/min/1.73 Final         Passed - Patient is not pregnant      Passed - Valid encounter within last 12 months    Recent Outpatient Visits           2 months ago Type 2 diabetes mellitus without complication, without long-term current use of insulin ([facility]  [facility]lsie StainMD   6 months ago Type 2 diabetes mellitus without complication, without long-term current use of insulin ([facility]  [facility] StainMD   7 months ago Type 2 diabetes mellitus without complication, without long-term current use of insulin ([place]  [place], RPH-CPP   7 months ago Type 2 diabetes mellitus without complication, without long-term current use of insulin [facility])   [place], Annie Main L, RPH-CPP   8 months ago Type 2 diabetes mellitus without complication, without long-term current use of insulin ([place])   [place] Elsie Stain, MD                 Requested Prescriptions  Pending Prescriptions Disp Refills   ibuprofen (ADVIL) 600 MG tablet 90 tablet 1    Sig: TAKE 1 TABLET (600 MG TOTAL) BY MOUTH EVERY 8 (EIGHT) HOURS AS NEEDED FOR MODERATE PAIN. TAKE AFTER EATING     Analgesics:  NSAIDS Failed - 07/26/2022 11:12 AM      Failed - Manual Review: Labs are only required if the patient has taken medication for more than 8 weeks.      Failed - HGB in normal range and within 360 days    Hemoglobin  Date Value Ref Range Status  01/30/2021 14.0 12.0 - 15.0 g/dL Final   06/06/2020 13.7 11.1 - 15.9 g/dL Final         Failed - PLT in normal range and within 360 days    Platelets  Date Value Ref Range Status  01/30/2021 289 150 - 400 K/uL Final  06/06/2020 258 150 - 450 x10E3/uL Final         Failed - HCT in normal range and within 360 days    HCT  Date Value Ref Range Status  01/30/2021 43.7 36.0 - 46.0 % Final   Hematocrit  Date Value Ref Range Status  06/06/2020 40.4 34.0 - 46.6 % Final         Passed - Cr in normal range and within 360 days    Creat  Date Value Ref Range Status  08/04/2020 0.52 0.50 - 1.05 mg/dL Final    Comment:    For patients >50 years of age, the reference limit for Creatinine is approximately 13% higher for people identified as African-American. .    Creatinine, Ser  Date Value Ref Range Status  11/15/2021 0.71 0.57 - 1.00 mg/dL Final   Creatinine, Urine  Date Value Ref Range Status  01/08/2017 122 20 - 320 mg/dL Final         Passed - eGFR is 30 or above and within 360 days    GFR, Est African American  Date Value Ref Range Status  08/04/2020 125 > OR = 60 mL/min/1.86m Final   GFR, Est Non African American  Date Value Ref Range Status  08/04/2020 108 > OR = 60 mL/min/1.758mFinal   GFR, Estimated  Date Value Ref Range Status  01/30/2021 >60 >60 mL/min Final    Comment:    (NOTE) Calculated using the CKD-EPI Creatinine Equation (2021)    eGFR  Date Value Ref Range Status  11/15/2021 100 >59 mL/min/1.73 Final         Passed - Patient is not pregnant      Passed - Valid encounter within last 12 months    Recent Outpatient Visits           2 months ago Type 2 diabetes mellitus without complication, without long-term current use of insulin ([facility])   [place]lsie StainMD   6 months ago Type 2 diabetes mellitus without complication, without long-term current use of  insulin [facility])   [place] Elsie Stain, MD   7  months ago Type 2 diabetes mellitus without complication, without long-term current use of insulin [facility])   [place], Annie Main L, RPH-CPP   7 months ago Type 2 diabetes mellitus without complication, without long-term current use of insulin [facility])   [place], Annie Main L, RPH-CPP   8 months ago Type 2 diabetes mellitus without complication, without long-term current use of insulin [facility])   [facility] Elsie Stain, MD

## 2022-07-30 ENCOUNTER — Other Ambulatory Visit: Payer: Self-pay | Admitting: Critical Care Medicine

## 2022-07-30 ENCOUNTER — Other Ambulatory Visit: Payer: Self-pay

## 2022-07-30 NOTE — Telephone Encounter (Signed)
Not on current med list. Requested Prescriptions  Pending Prescriptions Disp Refills  . ibuprofen (ADVIL) 600 MG tablet 90 tablet 1    Sig: TAKE 1 TABLET (600 MG TOTAL) BY MOUTH EVERY 8 (EIGHT) HOURS AS NEEDED FOR MODERATE PAIN. TAKE AFTER EATING     Analgesics:  NSAIDS Failed - 07/30/2022 12:09 PM      Failed - Manual Review: Labs are only required if the patient has taken medication for more than 8 weeks.      Failed - HGB in normal range and within 360 days    Hemoglobin  Date Value Ref Range Status  01/30/2021 14.0 12.0 - 15.0 g/dL Final  06/06/2020 13.7 11.1 - 15.9 g/dL Final         Failed - PLT in normal range and within 360 days    Platelets  Date Value Ref Range Status  01/30/2021 289 150 - 400 K/uL Final  06/06/2020 258 150 - 450 x10E3/uL Final         Failed - HCT in normal range and within 360 days    HCT  Date Value Ref Range Status  01/30/2021 43.7 36.0 - 46.0 % Final   Hematocrit  Date Value Ref Range Status  06/06/2020 40.4 34.0 - 46.6 % Final         Passed - Cr in normal range and within 360 days    Creat  Date Value Ref Range Status  08/04/2020 0.52 0.50 - 1.05 mg/dL Final    Comment:    For patients >7 years of age, the reference limit for Creatinine is approximately 13% higher for people identified as African-American. .    Creatinine, Ser  Date Value Ref Range Status  11/15/2021 0.71 0.57 - 1.00 mg/dL Final   Creatinine, Urine  Date Value Ref Range Status  01/08/2017 122 20 - 320 mg/dL Final         Passed - eGFR is 30 or above and within 360 days    GFR, Est African American  Date Value Ref Range Status  08/04/2020 125 > OR = 60 mL/min/1.27m Final   GFR, Est Non African American  Date Value Ref Range Status  08/04/2020 108 > OR = 60 mL/min/1.753mFinal   GFR, Estimated  Date Value Ref Range Status  01/30/2021 >60 >60 mL/min Final    Comment:    (NOTE) Calculated using the CKD-EPI Creatinine Equation (2021)    eGFR  Date  Value Ref Range Status  11/15/2021 100 >59 mL/min/1.73 Final         Passed - Patient is not pregnant      Passed - Valid encounter within last 12 months    Recent Outpatient Visits          2 months ago Type 2 diabetes mellitus without complication, without long-term current use of insulin ([facility] [place]  Co[place]lsie StainMD   6 months ago Type 2 diabetes mellitus without complication, without long-term current use of insulin ([place]  [place]ie StainMD   7 months ago Type 2 diabetes mellitus without complication, without long-term current use of insulin ([facility]  Co[place]StAnnie Main, RPH-CPP   7 months ago Type 2 diabetes mellitus without complication, without long-term current use of insulin ([facility]  Co[place]Annie Main, RPH-CPP   8 months ago Type  2 diabetes mellitus without complication, without long-term current use of insulin [facility])   [place] Elsie Stain, MD

## 2022-08-06 ENCOUNTER — Other Ambulatory Visit: Payer: Self-pay

## 2022-08-07 ENCOUNTER — Encounter (HOSPITAL_COMMUNITY): Payer: Self-pay

## 2022-08-07 ENCOUNTER — Other Ambulatory Visit: Payer: Self-pay

## 2022-08-07 ENCOUNTER — Emergency Department (HOSPITAL_COMMUNITY)
Admission: EM | Admit: 2022-08-07 | Discharge: 2022-08-07 | Disposition: A | Discharge status: Home / Self Care | Payer: Medicaid Other | Attending: Emergency Medicine | Admitting: Emergency Medicine

## 2022-08-07 DIAGNOSIS — Z79899 Other long term (current) drug therapy: Secondary | ICD-10-CM | POA: Diagnosis not present

## 2022-08-07 DIAGNOSIS — M109 Gout, unspecified: Secondary | ICD-10-CM | POA: Insufficient documentation

## 2022-08-07 DIAGNOSIS — M25572 Pain in left ankle and joints of left foot: Secondary | ICD-10-CM | POA: Diagnosis present

## 2022-08-07 DIAGNOSIS — Z7982 Long term (current) use of aspirin: Secondary | ICD-10-CM | POA: Diagnosis not present

## 2022-08-07 DIAGNOSIS — M25562 Pain in left knee: Secondary | ICD-10-CM | POA: Insufficient documentation

## 2022-08-07 MED ORDER — COLCHICINE 0.6 MG PO TABS
0.6000 mg | ORAL_TABLET | Freq: Every day | ORAL | 0 refills | Status: DC
  Filled 2022-08-07: qty 30, 30d supply, fill #0

## 2022-08-07 MED ORDER — PREDNISONE 10 MG PO TABS
ORAL_TABLET | Freq: Every day | ORAL | 0 refills | Status: DC
  Filled 2022-08-07: qty 42, 12d supply, fill #0

## 2022-08-07 NOTE — ED Triage Notes (Signed)
Pt to er, pt states that she is here for some L foot and ankle pain, states that she was dx with gout and given indocin, states that she has take the entire bottle and she is still having pain, states that it is painful to stand.  States that the pain is now her foot, ankle and up her leg.

## 2022-08-07 NOTE — ED Provider Notes (Signed)
[facility] DEPT Provider Note   CSN: [id number] Arrival date & time: 08/07/22  0848     History  No chief complaint on file.   Sydney Williams is a 56 y.o. female.  56 year old female presents with several days of left ankle and left knee pain.  History of gout and has been using Indocin without relief.  Denies any fever or chills.  No history of trauma.  Pain characterizes sharp and worse with standing.  No other joint discomfort noted.       Home Medications Prior to Admission medications   Medication Sig Start Date End Date Taking? Authorizing Provider  acetaminophen (TYLENOL) 500 MG tablet Take 1,000 mg by mouth every 6 (six) hours as needed for moderate pain.    [provider]  acyclovir (ZOVIRAX) 200 MG capsule TAKE 1 CAPSULE (200 MG TOTAL) BY MOUTH 2 (TWO) TIMES DAILY. 05/29/22 05/29/23  Elsie Stain, MD  albuterol (VENTOLIN HFA) 108 (90 Base) MCG/ACT inhaler Inhale 1-2 puffs into the lungs every 6 (six) hours as needed for wheezing or shortness of breath. 05/16/22   Elsie Stain, MD  amLODipine (NORVASC) 10 MG tablet TAKE 1 TABLET (10 MG TOTAL) BY MOUTH DAILY. TO LOWER BLOOD PRESSURE 05/16/22 05/16/23  Elsie Stain, MD  ascorbic acid (VITAMIN C) 500 MG tablet Take 500 mg by mouth daily.    [provider]  aspirin (EQ ASPIRIN ADULT LOW DOSE) 81 MG EC tablet Take 1 tablet (81 mg total) by mouth daily. Swallow whole. 02/03/18   Tysinger, Camelia Eng, PA-C  atorvastatin (LIPITOR) 40 MG tablet TAKE 1 TABLET (40 MG TOTAL) BY MOUTH DAILY. 05/16/22 05/16/23  Elsie Stain, MD  chlorthalidone (HYGROTEN) 15 MG tablet Take 1 tablet (15 mg total) by mouth daily. 05/16/22   Elsie Stain, MD  clobetasol cream (TEMOVATE) 6.94 % Apply 1 Application topically 2 (two) times daily. 05/16/22   Elsie Stain, MD  clotrimazole (LOTRIMIN) 1 % external solution Apply 1 Application topically 2 (two) times daily. To right ear canal 05/16/22    Elsie Stain, MD  Dulaglutide (TRULICITY) 1.5 WN/4.6EV SOPN Inject 1.5 mg into the skin once a week. 07/24/22   Elsie Stain, MD  ferrous sulfate 325 (65 FE) MG tablet Take 325 mg by mouth daily with breakfast.    [provider]  Fluticasone-Umeclidin-Vilant 200-62.5-25 MCG/ACT AEPB Inhale 1 puff into the lungs daily. Rinse mouth after each use. 05/16/22   Elsie Stain, MD  furosemide (LASIX) 20 MG tablet TAKE 1 TABLET (20 MG TOTAL) BY MOUTH DAILY AS NEEDED FOR EDEMA. 06/25/21 06/25/22  Elsie Stain, MD  indomethacin (INDOCIN) 50 MG capsule Take 1 capsule (50 mg total) by mouth 3 (three) times daily with meals. Continue for 2-3 days after pain resolves 07/03/22   Mayers, Cari S, PA-C  metFORMIN (GLUCOPHAGE) 500 MG tablet Take 2 tablets (1,000 mg total) by mouth 2 (two) times daily with a meal. 05/16/22   Elsie Stain, MD  metoprolol tartrate (LOPRESSOR) 50 MG tablet Take 1 tablet (50 mg total) by mouth 2 (two) times daily. 05/16/22   Elsie Stain, MD  nicotine polacrilex (NICORETTE MINI) 4 MG lozenge Use 3 times daily to stop smoking Patient not taking: Reported on 05/16/2022 09/11/21   Elsie Stain, MD  Vitamin D, Cholecalciferol, 25 MCG (1000 UT) TABS Take 1 tablet by mouth once daily 02/18/19   Tysinger, Camelia Eng, PA-C  Allergies    Lisinopril, Potassium chloride, Pneumococcal vaccines, and Vicodin [hydrocodone-acetaminophen]    Review of Systems   Review of Systems  All other systems reviewed and are negative.   Physical Exam Updated Vital Signs BP (!) 139/98 (BP Location: Left Arm)   Pulse 84   Temp 98.2 F (36.8 C) (Oral)   Resp 18   Ht 1.829 m (6')   Wt 110.2 kg   LMP 06/29/2015 (Exact Date)   SpO2 96%   BMI 32.96 kg/m  Physical Exam Vitals and nursing note reviewed.  Constitutional:      General: She is not in acute distress.    Appearance: Normal appearance. She is well-developed. She is not toxic-appearing.  HENT:     Head:  Normocephalic and atraumatic.  Eyes:     General: Lids are normal.     Conjunctiva/sclera: Conjunctivae normal.     Pupils: Pupils are equal, round, and reactive to light.  Neck:     Thyroid: No thyroid mass.     Trachea: No tracheal deviation.  Cardiovascular:     Rate and Rhythm: Normal rate and regular rhythm.     Heart sounds: Normal heart sounds. No murmur heard.    No gallop.  Pulmonary:     Effort: Pulmonary effort is normal. No respiratory distress.     Breath sounds: Normal breath sounds. No stridor. No decreased breath sounds, wheezing, rhonchi or rales.  Abdominal:     General: There is no distension.     Palpations: Abdomen is soft.     Tenderness: There is no abdominal tenderness. There is no rebound.  Musculoskeletal:        General: Normal range of motion.     Cervical back: Normal range of motion and neck supple.     Left knee: No swelling, deformity or effusion.     Left ankle: Swelling present. No deformity or ecchymosis. Tenderness present. Normal range of motion.  Skin:    General: Skin is warm and dry.     Findings: No abrasion or rash.  Neurological:     Mental Status: She is alert and oriented to person, place, and time. Mental status is at baseline.     GCS: GCS eye subscore is 4. GCS verbal subscore is 5. GCS motor subscore is 6.     Cranial Nerves: No cranial nerve deficit.     Sensory: No sensory deficit.     Motor: Motor function is intact.  Psychiatric:        Attention and Perception: Attention normal.        Speech: Speech normal.        Behavior: Behavior normal.     ED Results / Procedures / Treatments   Labs (all labs ordered are listed, but only abnormal results are displayed) Labs Reviewed - No data to display  EKG None  Radiology No results found.  Procedures Procedures    Medications Ordered in ED Medications - No data to display  ED Course/ Medical Decision Making/ A&P                           Medical Decision  Making  Patient presents with left ankle and left knee pain with known history of gout.  Patient has no evidence of septic arthritis.  Patient will be treated with corticosteroids as well as placed on colchicine.  Return precautions given        Final Clinical Impression(s) /  ED Diagnoses Final diagnoses:  None    Rx / DC Orders ED Discharge Orders     None         Lacretia Leigh, MD 08/07/22 1114

## 2022-08-13 ENCOUNTER — Other Ambulatory Visit: Payer: Self-pay

## 2022-08-16 ENCOUNTER — Other Ambulatory Visit: Payer: Self-pay

## 2022-08-19 ENCOUNTER — Other Ambulatory Visit: Payer: Self-pay

## 2022-08-21 ENCOUNTER — Other Ambulatory Visit: Payer: Self-pay

## 2022-08-22 ENCOUNTER — Other Ambulatory Visit: Payer: Self-pay

## 2022-08-23 ENCOUNTER — Other Ambulatory Visit: Payer: Self-pay

## 2022-08-24 ENCOUNTER — Ambulatory Visit: Payer: Medicaid Other | Attending: Family | Admitting: Family

## 2022-08-24 ENCOUNTER — Encounter: Payer: Self-pay | Admitting: Family

## 2022-08-24 VITALS — BP 133/89 | HR 75 | Resp 16 | Wt 250.4 lb

## 2022-08-24 DIAGNOSIS — M109 Gout, unspecified: Secondary | ICD-10-CM | POA: Diagnosis not present

## 2022-08-24 DIAGNOSIS — Z79899 Other long term (current) drug therapy: Secondary | ICD-10-CM | POA: Diagnosis not present

## 2022-08-24 DIAGNOSIS — E119 Type 2 diabetes mellitus without complications: Secondary | ICD-10-CM | POA: Insufficient documentation

## 2022-08-24 DIAGNOSIS — Z7182 Exercise counseling: Secondary | ICD-10-CM | POA: Diagnosis not present

## 2022-08-24 DIAGNOSIS — J45909 Unspecified asthma, uncomplicated: Secondary | ICD-10-CM | POA: Diagnosis not present

## 2022-08-24 DIAGNOSIS — Z794 Long term (current) use of insulin: Secondary | ICD-10-CM | POA: Insufficient documentation

## 2022-08-24 DIAGNOSIS — Z7984 Long term (current) use of oral hypoglycemic drugs: Secondary | ICD-10-CM | POA: Diagnosis not present

## 2022-08-24 MED ORDER — COLCHICINE 0.6 MG PO TABS
0.6000 mg | ORAL_TABLET | Freq: Every day | ORAL | 0 refills | Status: DC
  Filled 2022-08-24 – 2022-09-03 (×2): qty 30, 30d supply, fill #0

## 2022-08-24 NOTE — Progress Notes (Signed)
Sydney Williams, is a 56 y.o. female  AYT:[id number]  [id number]:[id number]  DOB - Dec 09, 1965  Subjective:  Chief Complaint and HPI: Sydney Williams is a 56 y.o. female here today for follow up visit for gout. She has a past medical history of gout to RLE, asthma and type 2 diabetes, well controlled, on Trulicity Thor weekly.  Patient reports that she was diagnosed with gout to the right foot earlier this month.  She was put on steroids that affected her diabetes by increasing her blood glucose, blood sugars readings were very high during that time.  Patient was put on Colchicine 0.6 mg by mouth once per day, she is doing well on the medication and her gout pain has decreased tremendously. Patient reports eating red meats and drinking beer. Patient wants refill of her gout medication today.  Patient has a hemoglobin A1c of 5.8%.  Monitors her blood sugars twice per day and they have been ranging between 80 to low 100 mg/dl.  She is taking metformin twice a day and Trulicity weekly.  Denies pain to right lower extremity, polyuria, polyphagia, polydipsia, chest pain, or shortness of breath  ED/Hospital notes reviewed.   Social History: Reviewed Family history: Reviewed  ROS:   Constitutional:  No f/c, No night sweats, No unexplained weight loss. EENT:  No vision changes, No blurry vision, No hearing changes. No mouth, throat, or ear problems.  Respiratory: No cough, No SOB Cardiac: No CP, no palpitations GI:  No abd pain, No N/V/D. GU: No Urinary s/sx Musculoskeletal: No joint pain Neuro: No headache, no dizziness, no motor weakness.  Skin: No rash Endocrine:  No polydipsia. No polyuria.  Psych: Denies SI/HI  No problems updated.  ALLERGIES: Allergies  Allergen Reactions   Lisinopril Swelling   Potassium Chloride Shortness Of Breath and Swelling    Tolerates IV KCl, reaction only to PO product   Pneumococcal Vaccines Swelling and Other (See Comments)    Reaction:  Swelling at injection site    Vicodin [Hydrocodone-Acetaminophen] Itching and Rash    PAST MEDICAL HISTORY: Past Medical History:  Diagnosis Date   Asthma    2015 hospitaliation for asthma   Diabetes mellitus    Type II   Eczema 05/16/2022   Family history of breast cancer    Fibroids 2010   Genital herpes    Genital herpes 11/30/2014   GERD (gastroesophageal reflux disease)    Hyperlipidemia    Hypertension    Impaired fasting blood sugar    Migraines    Obesity    Right carpal tunnel syndrome    Smoker     MEDICATIONS AT HOME: Prior to Admission medications   Medication Sig Start Date End Date Taking? Authorizing Provider  acetaminophen (TYLENOL) 500 MG tablet Take 1,000 mg by mouth every 6 (six) hours as needed for moderate pain.    [provider]  acyclovir (ZOVIRAX) 200 MG capsule TAKE 1 CAPSULE (200 MG TOTAL) BY MOUTH 2 (TWO) TIMES DAILY. 05/29/22 05/29/23  Elsie Stain, MD  albuterol (VENTOLIN HFA) 108 (90 Base) MCG/ACT inhaler Inhale 1-2 puffs into the lungs every 6 (six) hours as needed for wheezing or shortness of breath. 05/16/22   Elsie Stain, MD  amLODipine (NORVASC) 10 MG tablet TAKE 1 TABLET (10 MG TOTAL) BY MOUTH DAILY. TO LOWER BLOOD PRESSURE 05/16/22 05/16/23  Elsie Stain, MD  ascorbic acid (VITAMIN C) 500 MG tablet Take 500 mg by mouth daily.    [provider]  aspirin (  EQ ASPIRIN ADULT LOW DOSE) 81 MG EC tablet Take 1 tablet (81 mg total) by mouth daily. Swallow whole. 02/03/18   Tysinger, Camelia Eng, PA-C  atorvastatin (LIPITOR) 40 MG tablet TAKE 1 TABLET (40 MG TOTAL) BY MOUTH DAILY. 05/16/22 05/16/23  Elsie Stain, MD  chlorthalidone (HYGROTEN) 15 MG tablet Take 1 tablet (15 mg total) by mouth daily. 05/16/22   Elsie Stain, MD  clobetasol cream (TEMOVATE) 4.94 % Apply 1 Application topically 2 (two) times daily. 05/16/22   Elsie Stain, MD  clotrimazole (LOTRIMIN) 1 % external solution Apply 1 Application topically 2 (two) times daily. To right ear  canal 05/16/22   Elsie Stain, MD  colchicine 0.6 MG tablet Take 1 tablet (0.6 mg total) by mouth daily. 08/24/22   Feliberto Gottron, FNP  Dulaglutide (TRULICITY) 1.5 WH/6.7RF SOPN Inject 1.5 mg into the skin once a week. 07/24/22   Elsie Stain, MD  ferrous sulfate 325 (65 FE) MG tablet Take 325 mg by mouth daily with breakfast.    [provider]  Fluticasone-Umeclidin-Vilant 200-62.5-25 MCG/ACT AEPB Inhale 1 puff into the lungs daily. Rinse mouth after each use. 05/16/22   Elsie Stain, MD  furosemide (LASIX) 20 MG tablet TAKE 1 TABLET (20 MG TOTAL) BY MOUTH DAILY AS NEEDED FOR EDEMA. 06/25/21 06/25/22  Elsie Stain, MD  indomethacin (INDOCIN) 50 MG capsule Take 1 capsule (50 mg total) by mouth 3 (three) times daily with meals. Continue for 2-3 days after pain resolves 07/03/22   Mayers, Cari S, PA-C  metFORMIN (GLUCOPHAGE) 500 MG tablet Take 2 tablets (1,000 mg total) by mouth 2 (two) times daily with a meal. 05/16/22   Elsie Stain, MD  metoprolol tartrate (LOPRESSOR) 50 MG tablet Take 1 tablet (50 mg total) by mouth 2 (two) times daily. 05/16/22   Elsie Stain, MD  nicotine polacrilex (NICORETTE MINI) 4 MG lozenge Use 3 times daily to stop smoking Patient not taking: Reported on 05/16/2022 09/11/21   Elsie Stain, MD  predniSONE (DELTASONE) 10 MG tablet Take 6 tabs by mouth daily  for 2 days, then 5 tabs for 2 days, then 4 tabs for 2 days, then 3 tabs for 2 days, 2 tabs for 2 days, then 1 tab by mouth daily for 2 days 08/07/22   Lacretia Leigh, MD  Vitamin D, Cholecalciferol, 25 MCG (1000 UT) TABS Take 1 tablet by mouth once daily 02/18/19   Tysinger, Camelia Eng, PA-C     Objective:  EXAM:   Vitals:   08/24/22 1007  BP: 133/89  Pulse: 75  Resp: 16  SpO2: 96%  Weight: 250 lb 6.4 oz (113.6 kg)    General appearance : A&OX3. NAD. Non-toxic-appearing HEENT: Atraumatic and Normocephalic.  PERRLA. EOM intact.  TM clear B. Mouth-MMM, post pharynx WNL w/o  erythema, No PND. Neck: supple, no JVD. No cervical lymphadenopathy. No thyromegaly Chest/Lungs:  Breathing-non-labored, Good air entry bilaterally, breath sounds normal without rales, rhonchi, or wheezing  CVS: S1 S2 regular, no murmurs, gallops, rubs  Abdomen: Bowel sounds present, Non tender and not distended with no gaurding, rigidity or rebound. Extremities: Bilateral Lower Ext shows no edema, both legs are warm to touch with = pulse throughout Neurology:  CN II-XII grossly intact, Non focal.   Psych:  TP linear. J/I WNL. Normal speech. Appropriate eye contact and affect.  Skin:  No Rash  Data Review Lab Results  Component Value Date   HGBA1C 5.8 05/16/2022  HGBA1C 6.5 01/15/2022   HGBA1C 9.7 (H) 11/15/2021     Assessment & Plan   1. Gout involving toe of right foot, unspecified cause, unspecified chronicity -Colchicine 0.6 MG tablet; Take 1 tablet (0.6 mg total) by mouth daily.  Dispense: 30 tablet; Refill: 0 -Stay away from high purine foods, information provided. -Cut back on red meats and beer.  Follow-up in 3 months. -Reports new or worsening symptoms to the clinic or local ED.  2. Type 2 diabetes mellitus without complication, without long-term current use of insulin ([place]) -Continue monitoring blood sugar twice a day at least -Report consistent blood glucose less than 70 or greater than 249 to the clinic or local ED -Continue taking Trulicity weekly as ordered.  Continue taking metformin as ordered with food.     Patient have been counseled extensively about nutrition and exercise  Return in about 3 months (around 11/24/2022).  The patient was given clear instructions to go to ER or return to medical center if symptoms don't improve, worsen or new problems develop. The patient verbalized understanding. The patient was told to call to get lab results if they haven't heard anything in the next week.     Feliberto Gottron, APRN, FNP-C [facility] and  [facility] [place], [place]   08/24/2022, 10:22 AM

## 2022-08-24 NOTE — Patient Instructions (Signed)
1) Your gout medication was refilled 2) Information about gout provided and your were reminded to stay away from food or drinks that may trigger gout attack

## 2022-08-26 ENCOUNTER — Other Ambulatory Visit: Payer: Self-pay

## 2022-08-27 ENCOUNTER — Other Ambulatory Visit: Payer: Self-pay

## 2022-09-03 ENCOUNTER — Other Ambulatory Visit: Payer: Self-pay

## 2022-09-06 ENCOUNTER — Other Ambulatory Visit: Payer: Self-pay

## 2022-09-13 ENCOUNTER — Other Ambulatory Visit: Payer: Self-pay

## 2022-09-17 ENCOUNTER — Other Ambulatory Visit: Payer: Self-pay

## 2022-09-23 ENCOUNTER — Other Ambulatory Visit: Payer: Self-pay

## 2022-09-24 ENCOUNTER — Other Ambulatory Visit: Payer: Self-pay

## 2022-09-25 ENCOUNTER — Other Ambulatory Visit: Payer: Self-pay | Admitting: Family

## 2022-09-25 DIAGNOSIS — M109 Gout, unspecified: Secondary | ICD-10-CM

## 2022-09-26 ENCOUNTER — Other Ambulatory Visit: Payer: Self-pay

## 2022-09-26 MED ORDER — COLCHICINE 0.6 MG PO TABS
0.6000 mg | ORAL_TABLET | Freq: Every day | ORAL | 0 refills | Status: DC
  Filled 2022-09-26 – 2022-09-30 (×2): qty 30, 30d supply, fill #0

## 2022-09-26 NOTE — Telephone Encounter (Signed)
Requested medications are due for refill today.  yes  Requested medications are on the active medications list.  yes  Last refill. 08/19/2022 #30 0 rf  Future visit scheduled.   yes  Notes to clinic.  Labs are expired.    Requested Prescriptions  Pending Prescriptions Disp Refills   colchicine 0.6 MG tablet 30 tablet 0    Sig: Take 1 tablet (0.6 mg total) by mouth daily.     Endocrinology:  Gout Agents - colchicine Failed - 09/25/2022  6:37 PM      Failed - CBC within normal limits and completed in the last 12 months    WBC  Date Value Ref Range Status  01/30/2021 8.6 4.0 - 10.5 K/uL Final   RBC  Date Value Ref Range Status  01/30/2021 4.76 3.87 - 5.11 MIL/uL Final   Hemoglobin  Date Value Ref Range Status  01/30/2021 14.0 12.0 - 15.0 g/dL Final  06/06/2020 13.7 11.1 - 15.9 g/dL Final   HCT  Date Value Ref Range Status  01/30/2021 43.7 36.0 - 46.0 % Final   Hematocrit  Date Value Ref Range Status  06/06/2020 40.4 34.0 - 46.6 % Final   MCHC  Date Value Ref Range Status  01/30/2021 32.0 30.0 - 36.0 g/dL Final   [facility]  Date Value Ref Range Status  01/30/2021 29.4 26.0 - 34.0 pg Final   MCV  Date Value Ref Range Status  01/30/2021 91.8 80.0 - 100.0 fL Final  06/06/2020 92 79 - 97 fL Final   No results found for: "PLTCOUNTKUC", "LABPLAT", "POCPLA" RDW  Date Value Ref Range Status  01/30/2021 17.0 (H) 11.5 - 15.5 % Final  06/06/2020 13.2 11.7 - 15.4 % Final         Passed - Cr in normal range and within 360 days    Creat  Date Value Ref Range Status  08/04/2020 0.52 0.50 - 1.05 mg/dL Final    Comment:    For patients >35 years of age, the reference limit for Creatinine is approximately 13% higher for people identified as African-American. .    Creatinine, Ser  Date Value Ref Range Status  11/15/2021 0.71 0.57 - 1.00 mg/dL Final   Creatinine, Urine  Date Value Ref Range Status  01/08/2017 122 20 - 320 mg/dL Final         Passed - ALT in normal  range and within 360 days    ALT  Date Value Ref Range Status  10/18/2021 19 0 - 32 IU/L Final         Passed - AST in normal range and within 360 days    AST  Date Value Ref Range Status  10/18/2021 26 0 - 40 IU/L Final         Passed - Valid encounter within last 12 months    Recent Outpatient Visits           1 month ago Gout involving toe of right foot, unspecified cause, unspecified chronicity   [facility] [place], [place], FNP   4 months ago Type 2 diabetes mellitus without complication, without long-term current use of insulin ([place])   [place] Elsie Stain, MD   8 months ago Type 2 diabetes mellitus without complication, without long-term current use of insulin [facility])   [place] Elsie Stain, MD   9 months ago Type 2 diabetes mellitus without complication, without long-term current use of  insulin [facility])   [place], Jarome Matin, RPH-CPP   9 months ago Type 2 diabetes mellitus without complication, without long-term current use of insulin [facility])   [place], RPH-CPP       Future Appointments             In 2 months Joya Gaskins Burnett Harry, MD [place]

## 2022-09-30 ENCOUNTER — Other Ambulatory Visit: Payer: Self-pay | Admitting: Critical Care Medicine

## 2022-09-30 ENCOUNTER — Other Ambulatory Visit: Payer: Self-pay

## 2022-09-30 DIAGNOSIS — I10 Essential (primary) hypertension: Secondary | ICD-10-CM

## 2022-09-30 MED ORDER — ACYCLOVIR 200 MG PO CAPS
200.0000 mg | ORAL_CAPSULE | Freq: Two times a day (BID) | ORAL | 1 refills | Status: DC
  Filled 2022-09-30: qty 60, 30d supply, fill #0
  Filled 2022-11-20: qty 60, 30d supply, fill #1

## 2022-09-30 MED ORDER — METOPROLOL TARTRATE 50 MG PO TABS
50.0000 mg | ORAL_TABLET | Freq: Two times a day (BID) | ORAL | 1 refills | Status: DC
  Filled 2022-09-30: qty 60, 30d supply, fill #0
  Filled 2022-11-20: qty 60, 30d supply, fill #1

## 2022-10-01 ENCOUNTER — Other Ambulatory Visit: Payer: Self-pay

## 2022-10-03 ENCOUNTER — Ambulatory Visit: Payer: Medicaid Other | Attending: Physician Assistant | Admitting: Physician Assistant

## 2022-10-03 ENCOUNTER — Ambulatory Visit: Payer: Self-pay

## 2022-10-03 DIAGNOSIS — J0100 Acute maxillary sinusitis, unspecified: Secondary | ICD-10-CM

## 2022-10-03 LAB — POCT RAPID STREP A (OFFICE): Rapid Strep A Screen: NEGATIVE

## 2022-10-03 MED ORDER — AMOXICILLIN 500 MG PO CAPS: 500.0000 mg | ORAL_CAPSULE | Freq: Three times a day (TID) | ORAL | 0 refills | Status: AC

## 2022-10-03 MED ORDER — IBUPROFEN 600 MG PO TABS: 600.0000 mg | ORAL_TABLET | Freq: Three times a day (TID) | ORAL | 0 refills | Status: DC | PRN

## 2022-10-03 NOTE — Telephone Encounter (Signed)
  Chief Complaint: Sore throat Symptoms: Sore throat, HA, nasal congestion Frequency: 3 days Pertinent Negatives: Patient denies Fever, cough Disposition: '[]'$ ED /'[]'$ Urgent Care (no appt availability in office) / '[]'$ Appointment(In office/virtual)/ '[]'$  [place]/ '[]'$ Home Care/ '[]'$ Refused Recommended Disposition /'[x]'$ [facility] Mobile [facility]/ '[]'$  Follow-up with PCP Additional Notes: Pt has had sore throat, sinus congestion/drainage, and HA for 3 days. Per pt everything above her neck hurts. Pt states there is blood in the mucous in her throat.  Summary: runny nose, sore throat and headache   Pt states she has a runny nose, sore throat and headache x3d  Gave pt location to mobile clinic  Pt requesting a cb  Please assist further     Reason for Disposition  SEVERE (e.g., excruciating) throat pain  Answer Assessment - Initial Assessment Questions 1. ONSET: "When did the throat start hurting?" (Hours or days ago)      3 days 2. SEVERITY: "How bad is the sore throat?" (Scale 1-10; mild, moderate or severe)   - MILD (1-3):  Doesn't interfere with eating or normal activities.   - MODERATE (4-7): Interferes with eating some solids and normal activities.   - SEVERE (8-10):  Excruciating pain, interferes with most normal activities.   - SEVERE WITH DYSPHAGIA (10): Can't swallow liquids, drooling.     severe 3. STREP EXPOSURE: "Has there been any exposure to strep within the past week?" If Yes, ask: "What type of contact occurred?"      no 4.  VIRAL SYMPTOMS: "Are there any symptoms of a cold, such as a runny nose, cough, hoarse voice or red eyes?"      Runny nose, sinus congestion and HA 5. FEVER: "Do you have a fever?" If Yes, ask: "What is your temperature, how was it measured, and when did it start?"     No - taking Tylenol 6. PUS ON THE TONSILS: "Is there pus on the tonsils in the back of your throat?"     no 7. OTHER SYMPTOMS: "Do you have any other symptoms?" (e.g., difficulty  breathing, headache, rash)     no 8. PREGNANCY: "Is there any chance you are pregnant?" "When was your last menstrual period?"  Protocols used: Sore Throat-A-AH

## 2022-10-03 NOTE — Progress Notes (Signed)
Patient ID: Sydney Williams, female   DOB: [birth year], 56 y.o.   MRN: [id number] Virtual Visit via Telephone Note  I connected with [name] on 10/03/22 at 10:10 AM EST by telephone and verified that I am speaking with the correct person using two identifiers.  Location: Patient: [address] parking lot Provider: [address], [employer], [employer]   I discussed the limitations, risks, security and privacy concerns of performing an evaluation and management service by telephone and the availability of in person appointments. I also discussed with the patient that there may be a patient responsible charge related to this service. The patient expressed understanding and agreed to proceed.   History of Present Illness: Patient with 4 day h/o sinus pain and congestion and ST that is getting worse.  She has green and thick purulent drainage from her sinuses and some blood when she blows her nose.  Home Covid test was negative.  Some dry cough.  No fever.     Observations/Objective:  NAD.  Sounds congested.  A&Ox3.  Strep test is negative   Assessment and Plan:  1. Acute non-recurrent maxillary sinusitis - amoxicillin (AMOXIL) 500 MG capsule; Take 1 capsule (500 mg total) by mouth 3 (three) times daily for 10 days.  Dispense: 30 capsule; Refill: 0 - ibuprofen (ADVIL) 600 MG tablet; Take 1 tablet (600 mg total) by mouth every 8 (eight) hours as needed.  Dispense: 30 tablet; Refill: 0 - POCT rapid strep A    Follow Up Instructions: prn   I discussed the assessment and treatment plan with the patient. The patient was provided an opportunity to ask questions and all were answered. The patient agreed with the plan and demonstrated an understanding of the instructions.   The patient was advised to call back or seek an in-person evaluation if the symptoms worsen or if the condition fails to improve as anticipated.  I provided 8 minutes of non-face-to-face time during  this encounter.   Freeman Caldron, PA-C

## 2022-10-17 ENCOUNTER — Other Ambulatory Visit: Payer: Self-pay

## 2022-10-22 ENCOUNTER — Other Ambulatory Visit: Payer: Self-pay

## 2022-10-23 ENCOUNTER — Other Ambulatory Visit: Payer: Self-pay

## 2022-10-23 ENCOUNTER — Other Ambulatory Visit: Payer: Self-pay | Admitting: Critical Care Medicine

## 2022-10-23 MED ORDER — TRULICITY 1.5 MG/0.5ML ~~LOC~~ SOAJ
1.5000 mg | SUBCUTANEOUS | 2 refills | Status: DC
  Filled 2022-10-23: qty 2, 28d supply, fill #0
  Filled 2022-11-20: qty 2, 28d supply, fill #1
  Filled 2022-12-18: qty 2, 28d supply, fill #2

## 2022-10-24 ENCOUNTER — Other Ambulatory Visit: Payer: Self-pay

## 2022-10-25 ENCOUNTER — Other Ambulatory Visit: Payer: Self-pay

## 2022-10-29 ENCOUNTER — Other Ambulatory Visit: Payer: Self-pay

## 2022-10-29 ENCOUNTER — Telehealth: Payer: Self-pay | Admitting: Critical Care Medicine

## 2022-10-29 MED ORDER — CHLORTHALIDONE 25 MG PO TABS
12.5000 mg | ORAL_TABLET | Freq: Every day | ORAL | 2 refills | Status: DC
  Filled 2022-10-29: qty 45, 90d supply, fill #0

## 2022-10-29 NOTE — Telephone Encounter (Signed)
Wants med change Rx sent

## 2022-10-31 NOTE — Telephone Encounter (Signed)
Called patient and left voicemail.

## 2022-11-01 ENCOUNTER — Other Ambulatory Visit: Payer: Self-pay | Admitting: Critical Care Medicine

## 2022-11-01 ENCOUNTER — Other Ambulatory Visit: Payer: Self-pay

## 2022-11-01 DIAGNOSIS — M109 Gout, unspecified: Secondary | ICD-10-CM

## 2022-11-01 MED ORDER — COLCHICINE 0.6 MG PO TABS
0.6000 mg | ORAL_TABLET | Freq: Every day | ORAL | 0 refills | Status: DC
  Filled 2022-11-01: qty 30, 30d supply, fill #0

## 2022-11-01 NOTE — Telephone Encounter (Signed)
Requested medications are due for refill today.  yes  Requested medications are on the active medications list.  yes  Last refill. 09/26/2022 - #30 0 rf  Future visit scheduled.   yes  Notes to clinic.  Missing and expired labs.    Requested Prescriptions  Pending Prescriptions Disp Refills   colchicine 0.6 MG tablet 30 tablet 0    Sig: Take 1 tablet (0.6 mg total) by mouth daily.     Endocrinology:  Gout Agents - colchicine Failed - 11/01/2022  3:20 PM      Failed - ALT in normal range and within 360 days    ALT  Date Value Ref Range Status  10/18/2021 19 0 - 32 IU/L Final         Failed - AST in normal range and within 360 days    AST  Date Value Ref Range Status  10/18/2021 26 0 - 40 IU/L Final         Failed - CBC within normal limits and completed in the last 12 months    WBC  Date Value Ref Range Status  01/30/2021 8.6 4.0 - 10.5 K/uL Final   RBC  Date Value Ref Range Status  01/30/2021 4.76 3.87 - 5.11 MIL/uL Final   Hemoglobin  Date Value Ref Range Status  01/30/2021 14.0 12.0 - 15.0 g/dL Final  06/06/2020 13.7 11.1 - 15.9 g/dL Final   HCT  Date Value Ref Range Status  01/30/2021 43.7 36.0 - 46.0 % Final   Hematocrit  Date Value Ref Range Status  06/06/2020 40.4 34.0 - 46.6 % Final   MCHC  Date Value Ref Range Status  01/30/2021 32.0 30.0 - 36.0 g/dL Final   [facility]  Date Value Ref Range Status  01/30/2021 29.4 26.0 - 34.0 pg Final   MCV  Date Value Ref Range Status  01/30/2021 91.8 80.0 - 100.0 fL Final  06/06/2020 92 79 - 97 fL Final   No results found for: "PLTCOUNTKUC", "LABPLAT", "POCPLA" RDW  Date Value Ref Range Status  01/30/2021 17.0 (H) 11.5 - 15.5 % Final  06/06/2020 13.2 11.7 - 15.4 % Final         Passed - Cr in normal range and within 360 days    Creat  Date Value Ref Range Status  08/04/2020 0.52 0.50 - 1.05 mg/dL Final    Comment:    For patients >2 years of age, the reference limit for Creatinine is approximately 13%  higher for people identified as African-American. .    Creatinine, Ser  Date Value Ref Range Status  11/15/2021 0.71 0.57 - 1.00 mg/dL Final   Creatinine, Urine  Date Value Ref Range Status  01/08/2017 122 20 - 320 mg/dL Final         Passed - Valid encounter within last 12 months    Recent Outpatient Visits           4 weeks ago Acute non-recurrent maxillary sinusitis   [place], [place], [place]   2 months ago Gout involving toe of right foot, unspecified cause, unspecified chronicity   [place], [place], FNP   5 months ago Type 2 diabetes mellitus without complication, without long-term current use of insulin [facility])   [place] Elsie Stain, MD   9 months ago Type 2 diabetes mellitus without complication, without long-term current use of insulin ([place])   [place]  [place], MD   10 months ago Type 2 diabetes mellitus without complication, without long-term current use of insulin [facility])   [place], RPH-CPP       Future Appointments             In 3 weeks Joya Gaskins Burnett Harry, MD [place]

## 2022-11-04 ENCOUNTER — Other Ambulatory Visit: Payer: Self-pay

## 2022-11-05 ENCOUNTER — Other Ambulatory Visit: Payer: Self-pay

## 2022-11-08 ENCOUNTER — Other Ambulatory Visit: Payer: Self-pay

## 2022-11-08 MED ORDER — IBUPROFEN 800 MG PO TABS
800.0000 mg | ORAL_TABLET | Freq: Three times a day (TID) | ORAL | 0 refills | Status: DC
  Filled 2022-11-08: qty 21, 7d supply, fill #0

## 2022-11-21 ENCOUNTER — Other Ambulatory Visit: Payer: Self-pay

## 2022-11-24 NOTE — Progress Notes (Deleted)
Established Patient Office Visit  Subjective   Patient ID: Sydney Williams, female    DOB: October 10, 1966  Age: 57 y.o. MRN: [id number]  No chief complaint on file.   Sydney Williams is a 57 year old female who presents today for follow up. Her medical history includes hypertension, asthma, type 2 diabetes, and tobacco use.  Last seen in March, she had lowered her Hemoglobin A1C significantly (12.2% to 6.5%). She was taken off insulin and now is using Metformin and Trulicity daily. Today she is even lower with an Hemoglobin A1C of 5.7% She has been keeping up with her diet, and reports only a few cheat meals.  When she was seen in March we ordered a mammogram screen which showed some questionable findings in the right breast, the left was clear. She then had a follow up right breast ultrasound study (04/11/2022) and was found to have a 0.3 x 0.1 x 0.4 cm benign cyst.   At that same March visit,  Sydney Williams was experiencing some ear itchiness and was found to have a fungal excoriation on the right outer ear. Clotrimazole cream was sent to pharmacy but she was unable to obtain because of stocking issues.  She now is also reporting itchy bumps just below her thumbs on the dorsal aspect of her hands bilaterally. These bumps come and go and do not pop up elsewhere on her body. She believes this may be due to how long she has her latex gloves on at work.   Blood pressure today is 127/85. She is taking amlodipine, metoprolol and chlorthalidone daily.   Denies any recent shortness of breath. She is using fluticasone-umeclinid-vilant inhaler every morning. States she has not had to use the albuterol in a long time.  She is currently still smoking, about 3-5 cigarettes a day. At our last appointment I tried to send in nicotine lozenges however she does not have insurance and was not covered for these. She is still interested in trying to obtain them.   She was also since seen in the Emergency department twice for a  right broken toe (01/2022) and left foot calcaneous spurs (04/2022). Reports her feet frequently give her issues on and off, including pain and swelling.    Patient Active Problem List   Diagnosis Date Noted  . Acute gout involving toe of right foot 07/03/2022  . Foot callus 05/16/2022  . Breast cyst, right 05/16/2022  . Eczema 05/16/2022  . Aortic atherosclerosis ([place]) 11/15/2021  . Otomycosis of right ear 07/04/2021  . Otitis externa 07/04/2021  . Degenerative disc disease, lumbar   . History of lumbar spinal fusion 10/24/2020  . Seasonal and perennial allergic rhinoconjunctivitis 09/18/2020  . Carpal tunnel syndrome on right   . 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 Allergic conjunctivitis of both eyes 07/19/2020  . Lumbar radiculopathy, chronic 04/03/2020  . Elevated blood uric acid level 04/03/2020  . Family history of cervical cancer 02/02/2018  . Family history of breast cancer 02/02/2018  . Vitamin D deficiency 02/02/2018  . Depression 06/19/2017  . 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 Snoring 09/11/2016  . History of migraine 09/11/2016  . Frequent headaches 09/11/2016  . Gastroesophageal reflux disease without esophagitis 06/01/2015  . Other allergic rhinitis 06/01/2015  . Tobacco user 11/30/2014  . Essential hypertension 11/30/2014  . Hyperlipidemia 11/30/2014  . Asthma, moderate persistent 11/30/2014  . Obesity 07/02/2012  . Type 2 diabetes mellitus without complication, without long-term current use of insulin ([place])  07/02/2012   Past Medical History:  Diagnosis Date  . Asthma    2015 hospitaliation for asthma  . Diabetes mellitus    Type II  . Eczema 05/16/2022  . Family history of breast cancer   . Fibroids 2010  . Genital herpes   . Genital herpes 11/30/2014  . GERD (gastroesophageal reflux disease)   . Hyperlipidemia   . Hypertension   . Impaired fasting blood sugar   . Migraines   . Obesity   . Right carpal tunnel  syndrome   . Smoker    Past Surgical History:  Procedure Laterality Date  . BACK SURGERY  10/24/2020  . CARDIAC CATHETERIZATION    . CARPAL TUNNEL RELEASE Right 08/30/2020   Procedure: RIGHT CARPAL TUNNEL RELEASE;  Surgeon: Leandrew Koyanagi, MD;  Location: [facility];  Service: Orthopedics;  Laterality: Right;  . COLONOSCOPY  03/13/2016   Dr. Carlean Purl, normal, repeat 2027  . CYST EXCISION     left neck/postauricular region, benign  . DILATION AND CURETTAGE OF UTERUS    . LAPAROSCOPIC ABDOMINAL EXPLORATION     removal of ectopic preg  . LEFT HEART CATH AND CORONARY ANGIOGRAPHY N/A 06/23/2017   Procedure: LEFT HEART CATH AND CORONARY ANGIOGRAPHY;  Surgeon: Nelva Bush, MD;  Location: [facility];  Service: Cardiovascular;  Laterality: N/A;  . TUBAL LIGATION     Social History   Tobacco Use  . Smoking status: Every Day    Packs/day: 0.25    Years: 14.00    Total pack years: 3.50    Types: Cigarettes  . Smokeless tobacco: Never  Vaping Use  . Vaping Use: Never used  Substance Use Topics  . Alcohol use: Yes    Comment: occ  . Drug use: No   Family History  Problem Relation Age of Onset  . Diabetes Mother   . Hypertension Mother   . Aneurysm Mother   . Stroke Mother   . Cancer Mother        cervical cancer  . Breast cancer Maternal Aunt   . Cancer Maternal Aunt        breast  . Cancer Cousin        breast/breast  . Lupus Cousin   . Heart disease Neg Hx   . Colon cancer Neg Hx   . Allergic rhinitis Neg Hx   . Angioedema Neg Hx   . Asthma Neg Hx   . Atopy Neg Hx   . Eczema Neg Hx   . Immunodeficiency Neg Hx   . Urticaria Neg Hx    Allergies  Allergen Reactions  . Lisinopril Swelling  . Potassium Chloride Shortness Of Breath and Swelling    Tolerates IV KCl, reaction only to PO product  . Pneumococcal Vaccines Swelling and Other (See Comments)    Reaction:  Swelling at injection site  . Vicodin [Hydrocodone-Acetaminophen] Itching and Rash       Review of Systems  Constitutional: Negative.   HENT:         Right ear itching  Eyes: Negative.   Respiratory: Negative.    Cardiovascular: Negative.   Gastrointestinal: Negative.   Genitourinary: Negative.   Musculoskeletal:        Intermittent bilateral foot pain and swelling  Skin:  Positive for itching and rash.  Neurological: Negative.   Endo/Heme/Allergies: Negative.   Psychiatric/Behavioral: Negative.        Objective:     LMP 06/29/2015 (Exact Date)     Physical  Exam Constitutional:      Appearance: Normal appearance.  HENT:     Head: Normocephalic.     Right Ear: Tympanic membrane, ear canal and external ear normal.     Left Ear: Tympanic membrane and ear canal normal.     Ears:     Comments: Left outer ear, about 2 cm area of excoriation     Nose: Nose normal.     Mouth/Throat:     Mouth: Mucous membranes are moist.     Pharynx: Oropharynx is clear.  Eyes:     Conjunctiva/sclera: Conjunctivae normal.  Cardiovascular:     Rate and Rhythm: Normal rate and regular rhythm.     Pulses: Normal pulses.     Heart sounds: Normal heart sounds.  Pulmonary:     Effort: Pulmonary effort is normal.     Breath sounds: Normal breath sounds.  Abdominal:     General: Bowel sounds are normal.     Palpations: Abdomen is soft.  Musculoskeletal:     Comments: Left plantar callus   Skin:    Comments: Bilateral bases of thumb (dorsal) with multiple erythematous small papules, no drainage/ulceration   Neurological:     Mental Status: She is alert and oriented to person, place, and time.  Psychiatric:        Mood and Affect: Mood normal.        Behavior: Behavior normal.     No results found for any visits on 11/26/22.      The 10-year ASCVD risk score (Arnett DK, et al., 2019) is: 27.3%    Assessment & Plan:   Problem List Items Addressed This Visit   None  No follow-ups on file.    Asencion Noble, MD

## 2022-11-26 ENCOUNTER — Ambulatory Visit: Payer: No Typology Code available for payment source | Admitting: Critical Care Medicine

## 2022-12-02 ENCOUNTER — Other Ambulatory Visit: Payer: Self-pay

## 2022-12-04 ENCOUNTER — Other Ambulatory Visit: Payer: Self-pay

## 2022-12-04 ENCOUNTER — Other Ambulatory Visit: Payer: Self-pay | Admitting: Critical Care Medicine

## 2022-12-04 DIAGNOSIS — M109 Gout, unspecified: Secondary | ICD-10-CM

## 2022-12-04 MED ORDER — COLCHICINE 0.6 MG PO TABS
0.6000 mg | ORAL_TABLET | Freq: Every day | ORAL | 0 refills | Status: DC
  Filled 2022-12-04: qty 30, 30d supply, fill #0

## 2022-12-06 ENCOUNTER — Ambulatory Visit: Payer: Medicare Other

## 2022-12-06 ENCOUNTER — Other Ambulatory Visit: Payer: Self-pay

## 2022-12-09 ENCOUNTER — Ambulatory Visit: Payer: Medicare Other | Attending: Family Medicine

## 2022-12-09 DIAGNOSIS — Z111 Encounter for screening for respiratory tuberculosis: Secondary | ICD-10-CM

## 2022-12-09 NOTE — Progress Notes (Signed)
Tuberculin skin test  administered in Right Forearm 6 mm transient wheal noted. Patient denies pain at site. Information sheet given. Post skin test instructions given .Patient instructed to return 48-72 hour after skin  test has been administered. Voiced understanding of all instructions given.

## 2022-12-11 ENCOUNTER — Ambulatory Visit: Payer: Medicare Other | Attending: Critical Care Medicine

## 2022-12-11 ENCOUNTER — Other Ambulatory Visit: Payer: Self-pay

## 2022-12-11 DIAGNOSIS — R7611 Nonspecific reaction to tuberculin skin test without active tuberculosis: Secondary | ICD-10-CM | POA: Diagnosis not present

## 2022-12-11 LAB — TB SKIN TEST
Induration: 0 mm
TB Skin Test: NEGATIVE

## 2022-12-11 NOTE — Progress Notes (Signed)
PPD Reading Note  PPD read and results entered in EpicCare.  Result: 0 mm induration.  Interpretation: negative

## 2022-12-12 NOTE — Progress Notes (Signed)
Aware ppd neg

## 2022-12-13 ENCOUNTER — Other Ambulatory Visit: Payer: Self-pay

## 2022-12-18 ENCOUNTER — Other Ambulatory Visit: Payer: Self-pay

## 2022-12-23 ENCOUNTER — Telehealth: Payer: Self-pay | Admitting: Critical Care Medicine

## 2022-12-23 ENCOUNTER — Other Ambulatory Visit: Payer: Self-pay

## 2022-12-23 NOTE — Telephone Encounter (Signed)
Yes I will do so

## 2022-12-23 NOTE — Telephone Encounter (Signed)
Call placed to patient. She was unavailable so I left HIPAA-compliant VM with instructions to return call.   Dr. Joya Gaskins,   You see this patient Thursday. Trulicity is currently on backorder. Would you consider switching to Ozempic at that time? Her glycemic control is excellent.

## 2022-12-23 NOTE — Telephone Encounter (Signed)
Pts Dulaglutide (TRULICITY) 1.5 [phone] SOPN [id number]  Is on back order and she has no medication left / she was advised by the pharmacy to ask Dr. Joya Gaskins for an alternative / please advise and send to [place] [facility]

## 2022-12-26 ENCOUNTER — Encounter: Payer: Self-pay | Admitting: Critical Care Medicine

## 2022-12-26 ENCOUNTER — Other Ambulatory Visit: Payer: Self-pay

## 2022-12-26 ENCOUNTER — Ambulatory Visit: Payer: Medicare Other | Attending: Critical Care Medicine | Admitting: Critical Care Medicine

## 2022-12-26 VITALS — BP 120/70 | HR 78 | Ht 72.0 in | Wt 238.2 lb

## 2022-12-26 DIAGNOSIS — Z79899 Other long term (current) drug therapy: Secondary | ICD-10-CM | POA: Diagnosis not present

## 2022-12-26 DIAGNOSIS — E782 Mixed hyperlipidemia: Secondary | ICD-10-CM

## 2022-12-26 DIAGNOSIS — E669 Obesity, unspecified: Secondary | ICD-10-CM | POA: Insufficient documentation

## 2022-12-26 DIAGNOSIS — I700 Atherosclerosis of aorta: Secondary | ICD-10-CM | POA: Insufficient documentation

## 2022-12-26 DIAGNOSIS — E785 Hyperlipidemia, unspecified: Secondary | ICD-10-CM | POA: Diagnosis not present

## 2022-12-26 DIAGNOSIS — F17210 Nicotine dependence, cigarettes, uncomplicated: Secondary | ICD-10-CM | POA: Insufficient documentation

## 2022-12-26 DIAGNOSIS — E119 Type 2 diabetes mellitus without complications: Secondary | ICD-10-CM | POA: Insufficient documentation

## 2022-12-26 DIAGNOSIS — B369 Superficial mycosis, unspecified: Secondary | ICD-10-CM | POA: Insufficient documentation

## 2022-12-26 DIAGNOSIS — M0600 Rheumatoid arthritis without rheumatoid factor, unspecified site: Secondary | ICD-10-CM | POA: Insufficient documentation

## 2022-12-26 DIAGNOSIS — M109 Gout, unspecified: Secondary | ICD-10-CM

## 2022-12-26 DIAGNOSIS — H6241 Otitis externa in other diseases classified elsewhere, right ear: Secondary | ICD-10-CM | POA: Insufficient documentation

## 2022-12-26 DIAGNOSIS — Z6832 Body mass index (BMI) 32.0-32.9, adult: Secondary | ICD-10-CM | POA: Diagnosis not present

## 2022-12-26 DIAGNOSIS — J4540 Moderate persistent asthma, uncomplicated: Secondary | ICD-10-CM

## 2022-12-26 DIAGNOSIS — Z720 Tobacco use: Secondary | ICD-10-CM

## 2022-12-26 DIAGNOSIS — Z7985 Long-term (current) use of injectable non-insulin antidiabetic drugs: Secondary | ICD-10-CM | POA: Insufficient documentation

## 2022-12-26 DIAGNOSIS — R0981 Nasal congestion: Secondary | ICD-10-CM | POA: Insufficient documentation

## 2022-12-26 DIAGNOSIS — Z23 Encounter for immunization: Secondary | ICD-10-CM

## 2022-12-26 DIAGNOSIS — I10 Essential (primary) hypertension: Secondary | ICD-10-CM | POA: Insufficient documentation

## 2022-12-26 DIAGNOSIS — K219 Gastro-esophageal reflux disease without esophagitis: Secondary | ICD-10-CM | POA: Insufficient documentation

## 2022-12-26 LAB — POCT GLYCOSYLATED HEMOGLOBIN (HGB A1C): HbA1c, POC (controlled diabetic range): 5.9 % (ref 0.0–7.0)

## 2022-12-26 MED ORDER — OLOPATADINE HCL 0.1 % OP SOLN
1.0000 [drp] | Freq: Two times a day (BID) | OPHTHALMIC | 12 refills | Status: DC
  Filled 2022-12-26: qty 5, 50d supply, fill #0

## 2022-12-26 MED ORDER — ALBUTEROL SULFATE HFA 108 (90 BASE) MCG/ACT IN AERS
1.0000 | INHALATION_SPRAY | Freq: Four times a day (QID) | RESPIRATORY_TRACT | 0 refills | Status: DC | PRN
  Filled 2022-12-26: qty 18, 25d supply, fill #0

## 2022-12-26 MED ORDER — AZELASTINE HCL 0.1 % NA SOLN
2.0000 | Freq: Two times a day (BID) | NASAL | 12 refills | Status: DC
  Filled 2022-12-26: qty 30, 50d supply, fill #0
  Filled 2023-03-01: qty 30, 50d supply, fill #1

## 2022-12-26 MED ORDER — ATORVASTATIN CALCIUM 40 MG PO TABS
40.0000 mg | ORAL_TABLET | Freq: Every day | ORAL | 2 refills | Status: DC
  Filled 2022-12-26: qty 90, fill #0
  Filled 2023-02-23: qty 90, 90d supply, fill #0

## 2022-12-26 MED ORDER — CEFDINIR 300 MG PO CAPS
300.0000 mg | ORAL_CAPSULE | Freq: Two times a day (BID) | ORAL | 0 refills | Status: AC
  Filled 2022-12-26: qty 10, 5d supply, fill #0

## 2022-12-26 MED ORDER — FLUTICASONE-UMECLIDIN-VILANT 200-62.5-25 MCG/ACT IN AEPB
1.0000 | INHALATION_SPRAY | Freq: Every day | RESPIRATORY_TRACT | 4 refills | Status: DC
  Filled 2022-12-26: qty 60, 30d supply, fill #0
  Filled 2022-12-30: qty 180, 90d supply, fill #1

## 2022-12-26 MED ORDER — AMLODIPINE BESYLATE 10 MG PO TABS
10.0000 mg | ORAL_TABLET | Freq: Every day | ORAL | 2 refills | Status: DC
  Filled 2022-12-26 – 2022-12-28 (×2): qty 90, 90d supply, fill #0
  Filled 2023-03-13: qty 90, 90d supply, fill #1

## 2022-12-26 MED ORDER — CLOTRIMAZOLE 1 % EX SOLN: 1.0000 | Freq: Two times a day (BID) | CUTANEOUS | 0 refills | Status: DC

## 2022-12-26 MED ORDER — SEMAGLUTIDE (1 MG/DOSE) 4 MG/3ML ~~LOC~~ SOPN
1.0000 mg | PEN_INJECTOR | SUBCUTANEOUS | 4 refills | Status: DC
  Filled 2022-12-26: qty 3, 28d supply, fill #0
  Filled 2023-01-17: qty 3, 28d supply, fill #1
  Filled 2023-02-12: qty 3, 28d supply, fill #2
  Filled 2023-03-12: qty 3, 28d supply, fill #3
  Filled 2023-04-09: qty 3, 28d supply, fill #4

## 2022-12-26 MED ORDER — FLUTICASONE PROPIONATE 50 MCG/ACT NA SUSP
2.0000 | Freq: Every day | NASAL | 6 refills | Status: DC
  Filled 2022-12-26: qty 16, 30d supply, fill #0

## 2022-12-26 MED ORDER — CLOBETASOL PROPIONATE 0.05 % EX CREA: 1.0000 | TOPICAL_CREAM | Freq: Two times a day (BID) | CUTANEOUS | 0 refills | Status: DC

## 2022-12-26 MED ORDER — FUROSEMIDE 20 MG PO TABS
20.0000 mg | ORAL_TABLET | Freq: Every day | ORAL | 1 refills | Status: DC | PRN
  Filled 2022-12-26: qty 30, 30d supply, fill #0

## 2022-12-26 MED ORDER — CHLORTHALIDONE 25 MG PO TABS
12.5000 mg | ORAL_TABLET | Freq: Every day | ORAL | 2 refills | Status: DC
  Filled 2022-12-26 – 2023-01-17 (×2): qty 45, 90d supply, fill #0
  Filled 2023-04-26: qty 45, 90d supply, fill #1

## 2022-12-26 MED ORDER — BD PEN NEEDLE SHORT U/F 31G X 8 MM MISC
1.0000 | 2 refills | Status: AC | PRN
  Filled 2022-12-26: qty 100, fill #0

## 2022-12-26 MED ORDER — METFORMIN HCL 500 MG PO TABS
1000.0000 mg | ORAL_TABLET | Freq: Two times a day (BID) | ORAL | 1 refills | Status: DC
  Filled 2022-12-26 – 2023-01-17 (×2): qty 360, 90d supply, fill #0

## 2022-12-26 MED ORDER — METOPROLOL TARTRATE 50 MG PO TABS
50.0000 mg | ORAL_TABLET | Freq: Two times a day (BID) | ORAL | 1 refills | Status: DC
  Filled 2022-12-26 – 2022-12-28 (×2): qty 60, 30d supply, fill #0
  Filled 2023-02-11: qty 60, 30d supply, fill #1

## 2022-12-26 MED ORDER — COLCHICINE 0.6 MG PO TABS
0.6000 mg | ORAL_TABLET | Freq: Every day | ORAL | 2 refills | Status: DC | PRN
  Filled 2022-12-26: qty 30, 30d supply, fill #0

## 2022-12-26 MED ORDER — ACYCLOVIR 200 MG PO CAPS
200.0000 mg | ORAL_CAPSULE | Freq: Two times a day (BID) | ORAL | 1 refills | Status: DC
  Filled 2022-12-26 – 2023-01-04 (×2): qty 60, 30d supply, fill #0
  Filled 2023-02-11: qty 60, 30d supply, fill #1

## 2022-12-26 NOTE — Assessment & Plan Note (Signed)
Continue statins and monitor

## 2022-12-26 NOTE — Assessment & Plan Note (Signed)
Blood pressure well controlled no change in medications 

## 2022-12-26 NOTE — Patient Instructions (Signed)
Medications sent to our pharmacy downstairs except for the cream as they were sent to [facility]  We discussed methods smoking cessation  Discontinue Trulicity begin Ozempic 1 injection weekly this was sent downstairs to pharmacy  Eyedrops were given and an eye referral was made for your diabetes  Flu vaccine given  Return to Dr. Joya Gaskins 4 months

## 2022-12-26 NOTE — Assessment & Plan Note (Signed)
    Current smoking consumption amount: 1-3 cigarette a day  Dicsussion on advise to quit smoking and smoking impacts: Cardiovascular impact  Patient's willingness to quit: Wants to quit  Methods to quit smoking discussed: Behavioral modification and nicotine replacement  Medication management of smoking session drugs discussed: Nicotine replacement  Resources provided:  AVS   Setting quit date not established  Follow-up arranged 2 months   Time spent counseling the patient: 5 minutes

## 2022-12-26 NOTE — Assessment & Plan Note (Signed)
Continue with statins.  ?

## 2022-12-26 NOTE — Assessment & Plan Note (Signed)
Continue with topicals to right ear

## 2022-12-26 NOTE — Assessment & Plan Note (Signed)
Continue inhaled medications 

## 2022-12-26 NOTE — Assessment & Plan Note (Signed)
Diabetes under good control no change in medications continue metformin alone

## 2022-12-26 NOTE — Progress Notes (Signed)
Swollen nodule on the right side under chin. No vaccines today.

## 2022-12-26 NOTE — Progress Notes (Addendum)
Established Patient Office Visit  Subjective   Patient ID: Sydney Williams, female    DOB: 1965-12-26  Age: 57 y.o. MRN: [id number]  Chief Complaint  Patient presents with   Diabetes   Nasal Congestion    04/2022 Sydney Williams is a 57 year old female who presents today for follow up. Her medical history includes hypertension, asthma, type 2 diabetes, and tobacco use.  Last seen in March, she had lowered her Hemoglobin A1C significantly (12.2% to 6.5%). She was taken off insulin and now is using Metformin and Trulicity daily. Today she is even lower with an Hemoglobin A1C of 5.7% She has been keeping up with her diet, and reports only a few cheat meals.  When she was seen in March we ordered a mammogram screen which showed some questionable findings in the right breast, the left was clear. She then had a follow up right breast ultrasound study (04/11/2022) and was found to have a 0.3 x 0.1 x 0.4 cm benign cyst.   At that same March visit,  Sydney Williams was experiencing some ear itchiness and was found to have a fungal excoriation on the right outer ear. Clotrimazole cream was sent to pharmacy but she was unable to obtain because of stocking issues.  She now is also reporting itchy bumps just below her thumbs on the dorsal aspect of her hands bilaterally. These bumps come and go and do not pop up elsewhere on her body. She believes this may be due to how long she has her latex gloves on at work.   Blood pressure today is 127/85. She is taking amlodipine, metoprolol and chlorthalidone daily.   Denies any recent shortness of breath. She is using fluticasone-umeclinid-vilant inhaler every morning. States she has not had to use the albuterol in a long time.  She is currently still smoking, about 3-5 cigarettes a day. At our last appointment I tried to send in nicotine lozenges however she does not have insurance and was not covered for these. She is still interested in trying to obtain them.   She was also  since seen in the Emergency department twice for a right broken toe (01/2022) and left foot calcaneous spurs (04/2022). Reports her feet frequently give her issues on and off, including pain and swelling.   12/26/22  Follow-up for diabetes and hypertension.  On arrival blood pressure is good.  He has advised.  Patient states sinus congestion. She has had gout in the past but it is improved.  She has Medicaid.  She is still smoking 4 to 5 cigarettes daily.  Blood sugars been well-controlled on arrival A1c is 5.9.  She does need an eye exam. Patient is concerned about a lump under her right jaw.     Patient Active Problem List   Diagnosis Date Noted   Acute gout involving toe of right foot 07/03/2022   Foot callus 05/16/2022   Breast cyst, right 05/16/2022   Eczema 05/16/2022   Aortic atherosclerosis ([place]) 11/15/2021   Otomycosis of right ear 07/04/2021   Degenerative disc disease, lumbar    History of lumbar spinal fusion 10/24/2020   Seasonal and perennial allergic rhinoconjunctivitis 09/18/2020   Carpal tunnel syndrome on right    Seronegative rheumatoid arthritis ([place]) 08/04/2020   Allergic conjunctivitis of both eyes 07/19/2020   Lumbar radiculopathy, chronic 04/03/2020   Elevated blood uric acid level 04/03/2020   Family history of cervical cancer 02/02/2018   Family history of breast cancer 02/02/2018  Vitamin D deficiency 02/02/2018   Depression 06/19/2017   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 01/08/2017   Snoring 09/11/2016   History of migraine 09/11/2016   Frequent headaches 09/11/2016   Gastroesophageal reflux disease without esophagitis 06/01/2015   Other allergic rhinitis 06/01/2015   Tobacco user 11/30/2014   Essential hypertension 11/30/2014   Hyperlipidemia 11/30/2014   Asthma, moderate persistent 11/30/2014   Obesity 07/02/2012   Type 2 diabetes mellitus without complication, without long-term current use of insulin ([place]) 07/02/2012   Past Medical  History:  Diagnosis Date   Asthma    2015 hospitaliation for asthma   Diabetes mellitus    Type II   Eczema 05/16/2022   Family history of breast cancer    Fibroids 2010   Genital herpes    Genital herpes 11/30/2014   GERD (gastroesophageal reflux disease)    Hyperlipidemia    Hypertension    Impaired fasting blood sugar    Migraines    Obesity    Right carpal tunnel syndrome    Smoker    Past Surgical History:  Procedure Laterality Date   BACK SURGERY  10/24/2020   CARDIAC CATHETERIZATION     CARPAL TUNNEL RELEASE Right 08/30/2020   Procedure: RIGHT CARPAL TUNNEL RELEASE;  Surgeon: Leandrew Koyanagi, MD;  Location: [facility];  Service: Orthopedics;  Laterality: Right;   COLONOSCOPY  03/13/2016   Dr. Carlean Purl, normal, repeat 2027   CYST EXCISION     left neck/postauricular region, benign   DILATION AND CURETTAGE OF UTERUS     LAPAROSCOPIC ABDOMINAL EXPLORATION     removal of ectopic preg   LEFT HEART CATH AND CORONARY ANGIOGRAPHY N/A 06/23/2017   Procedure: LEFT HEART CATH AND CORONARY ANGIOGRAPHY;  Surgeon: Nelva Bush, MD;  Location: [facility];  Service: Cardiovascular;  Laterality: N/A;   TUBAL LIGATION     Social History   Tobacco Use   Smoking status: Every Day    Packs/day: 0.25    Years: 14.00    Total pack years: 3.50    Types: Cigarettes   Smokeless tobacco: Never  Vaping Use   Vaping Use: Never used  Substance Use Topics   Alcohol use: Yes    Comment: occ   Drug use: No   Family History  Problem Relation Age of Onset   Diabetes Mother    Hypertension Mother    Aneurysm Mother    Stroke Mother    Cancer Mother        cervical cancer   Breast cancer Maternal Aunt    Cancer Maternal Aunt        breast   Cancer Cousin        breast/breast   Lupus Cousin    Heart disease Neg Hx    Colon cancer Neg Hx    Allergic rhinitis Neg Hx    Angioedema Neg Hx    Asthma Neg Hx    Atopy Neg Hx    Eczema Neg Hx    Immunodeficiency  Neg Hx    Urticaria Neg Hx    Allergies  Allergen Reactions   Lisinopril Swelling   Potassium Chloride Shortness Of Breath and Swelling    Tolerates IV KCl, reaction only to PO product   Pneumococcal Vaccines Swelling and Other (See Comments)    Reaction:  Swelling at injection site   Vicodin [Hydrocodone-Acetaminophen] Itching and Rash      Review of Systems  Constitutional: Negative.   HENT:  Positive for congestion  and sinus pain. Negative for sore throat.        Right ear itching, swelling under right jaw  Eyes: Negative.   Respiratory: Negative.    Cardiovascular: Negative.   Gastrointestinal: Negative.   Genitourinary: Negative.   Musculoskeletal:        Intermittent bilateral foot pain and swelling  Skin:  Positive for itching and rash.       On right earlobe  Neurological: Negative.   Endo/Heme/Allergies: Negative.   Psychiatric/Behavioral: Negative.        Objective:     BP 120/70   Pulse 78   Ht 6' (1.829 m)   Wt 238 lb 3.2 oz (108 kg)   LMP 06/29/2015 (Exact Date)   SpO2 98%   BMI 32.31 kg/m     Physical Exam Constitutional:      Appearance: Normal appearance. She is obese.  HENT:     Head: Normocephalic.     Right Ear: Tympanic membrane, ear canal and external ear normal.     Left Ear: Tympanic membrane and ear canal normal.     Ears:     Comments: Left outer ear, about 2 cm area of excoriation     Nose: Congestion and rhinorrhea present.     Mouth/Throat:     Mouth: Mucous membranes are moist.     Pharynx: Oropharynx is clear.     Comments: Submandibular gland is what the patient is feeling is normal Eyes:     Conjunctiva/sclera: Conjunctivae normal.     Comments: Fungal changes right earlobe  Cardiovascular:     Rate and Rhythm: Normal rate and regular rhythm.     Pulses: Normal pulses.     Heart sounds: Normal heart sounds.  Pulmonary:     Effort: Pulmonary effort is normal.     Breath sounds: Normal breath sounds.  Abdominal:      General: Bowel sounds are normal.     Palpations: Abdomen is soft.  Musculoskeletal:     Comments: Left plantar callus   Skin:    Comments: Bilateral bases of thumb (dorsal) with multiple erythematous small papules, no drainage/ulceration   Neurological:     Mental Status: She is alert and oriented to person, place, and time.  Psychiatric:        Mood and Affect: Mood normal.        Behavior: Behavior normal.      Results for orders placed or performed in visit on 12/26/22  HgB A1c  Result Value Ref Range   Hemoglobin A1C     HbA1c POC (<> result, manual entry)     HbA1c, POC (prediabetic range)     HbA1c, POC (controlled diabetic range) 5.9 0.0 - 7.0 %       The 10-year ASCVD risk score (Arnett DK, et al., 2019) is: 20.4%    Assessment & Plan:   Problem List Items Addressed This Visit       Cardiovascular and Mediastinum   Essential hypertension    Blood pressure well-controlled no change in medications      Relevant Medications   amLODipine (NORVASC) 10 MG tablet   atorvastatin (LIPITOR) 40 MG tablet   chlorthalidone (HYGROTON) 25 MG tablet   furosemide (LASIX) 20 MG tablet   metoprolol tartrate (LOPRESSOR) 50 MG tablet   Other Relevant Orders   CBC with Differential/Platelet   Aortic atherosclerosis (HCC)    Continue with statins      Relevant Medications   amLODipine (NORVASC) 10 MG  tablet   atorvastatin (LIPITOR) 40 MG tablet   chlorthalidone (HYGROTON) 25 MG tablet   furosemide (LASIX) 20 MG tablet   metoprolol tartrate (LOPRESSOR) 50 MG tablet   Other Relevant Orders   Lipid panel     Respiratory   Asthma, moderate persistent    Continue inhaled medications      Relevant Medications   albuterol (VENTOLIN HFA) 108 (90 Base) MCG/ACT inhaler   Fluticasone-Umeclidin-Vilant 200-62.5-25 MCG/ACT AEPB     Endocrine   Type 2 diabetes mellitus without complication, without long-term current use of insulin (HCC) - Primary    Diabetes under good  control no change in medications continue metformin alone      Relevant Medications   atorvastatin (LIPITOR) 40 MG tablet   metFORMIN (GLUCOPHAGE) 500 MG tablet   Semaglutide, 1 MG/DOSE, 4 MG/3ML SOPN   Other Relevant Orders   Urine microalbumin-creatinine with uACR   HgB A1c (Completed)   Ambulatory referral to Ophthalmology   Comprehensive metabolic panel     Nervous and Auditory   Otomycosis of right ear    Continue with topicals to right ear      Relevant Medications   acyclovir (ZOVIRAX) 200 MG capsule   clotrimazole (LOTRIMIN) 1 % external solution   cefdinir (OMNICEF) 300 MG capsule     Musculoskeletal and Integument   Seronegative rheumatoid arthritis (HCC)   Relevant Medications   colchicine 0.6 MG tablet     Other   Tobacco user       Current smoking consumption amount: 1-3 cigarette a day  Dicsussion on advise to quit smoking and smoking impacts: Cardiovascular impact  Patient's willingness to quit: Wants to quit  Methods to quit smoking discussed: Behavioral modification and nicotine replacement  Medication management of smoking session drugs discussed: Nicotine replacement  Resources provided:  AVS   Setting quit date not established  Follow-up arranged 2 months   Time spent counseling the patient: 5 minutes        Hyperlipidemia    Continue statins and monitor      Relevant Medications   amLODipine (NORVASC) 10 MG tablet   atorvastatin (LIPITOR) 40 MG tablet   chlorthalidone (HYGROTON) 25 MG tablet   furosemide (LASIX) 20 MG tablet   metoprolol tartrate (LOPRESSOR) 50 MG tablet   Other Visit Diagnoses     Gout involving toe of right foot, unspecified cause, unspecified chronicity       Relevant Medications   colchicine 0.6 MG tablet   Need for immunization against influenza       Relevant Orders   Flu Vaccine QUAD 26moIM (Fluarix, Fluzone & Alfiuria Quad PF) (Completed)     Flu vaccine given Return in about 4 months (around  04/26/2023) for diabetes, htn.    PAsencion Noble MD

## 2022-12-27 ENCOUNTER — Telehealth: Payer: Self-pay

## 2022-12-27 LAB — LIPID PANEL
Chol/HDL Ratio: 2.9 ratio (ref 0.0–4.4)
Cholesterol, Total: 172 mg/dL (ref 100–199)
HDL: 60 mg/dL (ref >39)
LDL Chol Calc (NIH): 86 mg/dL (ref 0–99)
Triglycerides: 151 mg/dL — ABNORMAL HIGH (ref 0–149)
VLDL Cholesterol Cal: 26 mg/dL (ref 5–40)

## 2022-12-27 LAB — CBC WITH DIFFERENTIAL/PLATELET
Basophils Absolute: 0.1 10*3/uL (ref 0.0–0.2)
Basos: 1 %
EOS (ABSOLUTE): 0.2 10*3/uL (ref 0.0–0.4)
Eos: 3 %
Hematocrit: 44.8 % (ref 34.0–46.6)
Hemoglobin: 15 g/dL (ref 11.1–15.9)
Immature Grans (Abs): 0 10*3/uL (ref 0.0–0.1)
Immature Granulocytes: 0 %
Lymphocytes Absolute: 2.3 10*3/uL (ref 0.7–3.1)
Lymphs: 40 %
MCH: 32.9 pg (ref 26.6–33.0)
MCHC: 33.5 g/dL (ref 31.5–35.7)
MCV: 98 fL — ABNORMAL HIGH (ref 79–97)
Monocytes Absolute: 0.6 10*3/uL (ref 0.1–0.9)
Monocytes: 10 %
Neutrophils Absolute: 2.6 10*3/uL (ref 1.4–7.0)
Neutrophils: 46 %
Platelets: 285 10*3/uL (ref 150–450)
RBC: 4.56 x10E6/uL (ref 3.77–5.28)
RDW: 14 % (ref 11.7–15.4)
WBC: 5.8 10*3/uL (ref 3.4–10.8)

## 2022-12-27 LAB — COMPREHENSIVE METABOLIC PANEL
ALT: 24 IU/L (ref 0–32)
AST: 22 IU/L (ref 0–40)
Albumin/Globulin Ratio: 1.3 (ref 1.2–2.2)
Albumin: 4.4 g/dL (ref 3.8–4.9)
Alkaline Phosphatase: 96 IU/L (ref 44–121)
BUN/Creatinine Ratio: 22 (ref 9–23)
BUN: 14 mg/dL (ref 6–24)
Bilirubin Total: 0.2 mg/dL (ref 0.0–1.2)
CO2: 28 mmol/L (ref 20–29)
Calcium: 9.7 mg/dL (ref 8.7–10.2)
Chloride: 97 mmol/L (ref 96–106)
Creatinine, Ser: 0.64 mg/dL (ref 0.57–1.00)
Globulin, Total: 3.3 g/dL (ref 1.5–4.5)
Glucose: 93 mg/dL (ref 70–99)
Potassium: 4 mmol/L (ref 3.5–5.2)
Sodium: 142 mmol/L (ref 134–144)
Total Protein: 7.7 g/dL (ref 6.0–8.5)
eGFR: 103 mL/min/{1.73_m2} (ref >59)

## 2022-12-27 NOTE — Telephone Encounter (Signed)
Pt was called and vm was left, Information has been sent to nurse pool.   

## 2022-12-27 NOTE — Telephone Encounter (Signed)
-----   Message from Elsie Stain, MD sent at 12/27/2022  8:21 AM EST ----- Let pt know blood count normal, , cholesterol at goal, kidney liver normal

## 2022-12-27 NOTE — Progress Notes (Signed)
Let pt know blood count normal, , cholesterol at goal, kidney liver normal

## 2022-12-28 LAB — MICROALBUMIN / CREATININE URINE RATIO
Creatinine, Urine: 141.8 mg/dL
Microalb/Creat Ratio: 8 mg/g creat (ref 0–29)
Microalbumin, Urine: 11.6 ug/mL

## 2022-12-29 NOTE — Progress Notes (Signed)
Let pt know urine is normal no kidney damage.

## 2022-12-30 ENCOUNTER — Other Ambulatory Visit: Payer: Self-pay

## 2022-12-30 ENCOUNTER — Telehealth: Payer: Self-pay

## 2022-12-30 NOTE — Telephone Encounter (Signed)
Pt was called and vm was left, Information has been sent to nurse pool.   

## 2022-12-30 NOTE — Telephone Encounter (Signed)
-----   Message from Elsie Stain, MD sent at 12/29/2022  3:18 PM EST ----- Let pt know urine is normal no kidney damage.

## 2023-01-01 ENCOUNTER — Encounter: Payer: Medicare Other | Admitting: Critical Care Medicine

## 2023-01-01 NOTE — Progress Notes (Deleted)
Established Patient Office Visit  Subjective   Patient ID: Sydney Williams, female    DOB: 1966/09/29  Age: 57 y.o. MRN: [id number]  No chief complaint on file.   04/2022 Sydney Williams is a 57 year old female who presents today for follow up. Her medical history includes hypertension, asthma, type 2 diabetes, and tobacco use.  Last seen in March, she had lowered her Hemoglobin A1C significantly (12.2% to 6.5%). She was taken off insulin and now is using Metformin and Trulicity daily. Today she is even lower with an Hemoglobin A1C of 5.7% She has been keeping up with her diet, and reports only a few cheat meals.  When she was seen in March we ordered a mammogram screen which showed some questionable findings in the right breast, the left was clear. She then had a follow up right breast ultrasound study (04/11/2022) and was found to have a 0.3 x 0.1 x 0.4 cm benign cyst.   At that same March visit,  Sydney Williams was experiencing some ear itchiness and was found to have a fungal excoriation on the right outer ear. Clotrimazole cream was sent to pharmacy but she was unable to obtain because of stocking issues.  She now is also reporting itchy bumps just below her thumbs on the dorsal aspect of her hands bilaterally. These bumps come and go and do not pop up elsewhere on her body. She believes this may be due to how long she has her latex gloves on at work.   Blood pressure today is 127/85. She is taking amlodipine, metoprolol and chlorthalidone daily.   Denies any recent shortness of breath. She is using fluticasone-umeclinid-vilant inhaler every morning. States she has not had to use the albuterol in a long time.  She is currently still smoking, about 3-5 cigarettes a day. At our last appointment I tried to send in nicotine lozenges however she does not have insurance and was not covered for these. She is still interested in trying to obtain them.   She was also since seen in the Emergency department twice  for a right broken toe (01/2022) and left foot calcaneous spurs (04/2022). Reports her feet frequently give her issues on and off, including pain and swelling.   12/26/22  Follow-up for diabetes and hypertension.  On arrival blood pressure is good.  He has advised.  Patient states sinus congestion. She has had gout in the past but it is improved.  She has Medicaid.  She is still smoking 4 to 5 cigarettes daily.  Blood sugars been well-controlled on arrival A1c is 5.9.  She does need an eye exam. Patient is concerned about a lump under her right jaw.  01/01/23 Essential hypertension       Blood pressure well-controlled no change in medications      Relevant Medications   amLODipine (NORVASC) 10 MG tablet   atorvastatin (LIPITOR) 40 MG tablet   chlorthalidone (HYGROTON) 25 MG tablet   furosemide (LASIX) 20 MG tablet   metoprolol tartrate (LOPRESSOR) 50 MG tablet   Other Relevant Orders   CBC with Differential/Platelet   Aortic atherosclerosis (HCC)      Continue with statins      Relevant Medications   amLODipine (NORVASC) 10 MG tablet   atorvastatin (LIPITOR) 40 MG tablet   chlorthalidone (HYGROTON) 25 MG tablet   furosemide (LASIX) 20 MG tablet   metoprolol tartrate (LOPRESSOR) 50 MG tablet   Other Relevant Orders   Lipid panel  Respiratory   Asthma, moderate persistent      Continue inhaled medications      Relevant Medications   albuterol (VENTOLIN HFA) 108 (90 Base) MCG/ACT inhaler   Fluticasone-Umeclidin-Vilant 200-62.5-25 MCG/ACT AEPB      Endocrine   Type 2 diabetes mellitus without complication, without long-term current use of insulin (HCC) - Primary      Diabetes under good control no change in medications continue metformin alone      Relevant Medications   atorvastatin (LIPITOR) 40 MG tablet   metFORMIN (GLUCOPHAGE) 500 MG tablet   Semaglutide, 1 MG/DOSE, 4 MG/3ML SOPN   Other Relevant Orders   Urine microalbumin-creatinine with uACR   HgB A1c  (Completed)   Ambulatory referral to Ophthalmology   Comprehensive metabolic panel      Nervous and Auditory   Otomycosis of right ear      Continue with topicals to right ear      Relevant Medications   acyclovir (ZOVIRAX) 200 MG capsule   clotrimazole (LOTRIMIN) 1 % external solution   cefdinir (OMNICEF) 300 MG capsule      Musculoskeletal and Integument   Seronegative rheumatoid arthritis (HCC)   Relevant Medications   colchicine 0.6 MG tablet      Other   Tobacco user       Patient Active Problem List   Diagnosis Date Noted   Acute gout involving toe of right foot 07/03/2022   Foot callus 05/16/2022   Breast cyst, right 05/16/2022   Eczema 05/16/2022   Aortic atherosclerosis ([place]) 11/15/2021   Otomycosis of right ear 07/04/2021   Degenerative disc disease, lumbar    History of lumbar spinal fusion 10/24/2020   Seasonal and perennial allergic rhinoconjunctivitis 09/18/2020   Carpal tunnel syndrome on right    Seronegative rheumatoid arthritis ([place]) 08/04/2020   Allergic conjunctivitis of both eyes 07/19/2020   Lumbar radiculopathy, chronic 04/03/2020   Elevated blood uric acid level 04/03/2020   Family history of cervical cancer 02/02/2018   Family history of breast cancer 02/02/2018   Vitamin D deficiency 02/02/2018   Depression 06/19/2017   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 01/08/2017   Snoring 09/11/2016   History of migraine 09/11/2016   Frequent headaches 09/11/2016   Gastroesophageal reflux disease without esophagitis 06/01/2015   Other allergic rhinitis 06/01/2015   Tobacco user 11/30/2014   Essential hypertension 11/30/2014   Hyperlipidemia 11/30/2014   Asthma, moderate persistent 11/30/2014   Obesity 07/02/2012   Type 2 diabetes mellitus without complication, without long-term current use of insulin ([place]) 07/02/2012   Past Medical History:  Diagnosis Date   Asthma    2015 hospitaliation for asthma   Diabetes mellitus    Type II    Eczema 05/16/2022   Family history of breast cancer    Fibroids 2010   Genital herpes    Genital herpes 11/30/2014   GERD (gastroesophageal reflux disease)    Hyperlipidemia    Hypertension    Impaired fasting blood sugar    Migraines    Obesity    Right carpal tunnel syndrome    Smoker    Past Surgical History:  Procedure Laterality Date   BACK SURGERY  10/24/2020   CARDIAC CATHETERIZATION     CARPAL TUNNEL RELEASE Right 08/30/2020   Procedure: RIGHT CARPAL TUNNEL RELEASE;  Surgeon: Leandrew Koyanagi, MD;  Location: [facility];  Service: Orthopedics;  Laterality: Right;   COLONOSCOPY  03/13/2016   Dr. Carlean Purl, normal, repeat 2027   CYST EXCISION  left neck/postauricular region, benign   DILATION AND CURETTAGE OF UTERUS     LAPAROSCOPIC ABDOMINAL EXPLORATION     removal of ectopic preg   LEFT HEART CATH AND CORONARY ANGIOGRAPHY N/A 06/23/2017   Procedure: LEFT HEART CATH AND CORONARY ANGIOGRAPHY;  Surgeon: Nelva Bush, MD;  Location: [facility];  Service: Cardiovascular;  Laterality: N/A;   TUBAL LIGATION     Social History   Tobacco Use   Smoking status: Every Day    Packs/day: 0.25    Years: 14.00    Total pack years: 3.50    Types: Cigarettes   Smokeless tobacco: Never  Vaping Use   Vaping Use: Never used  Substance Use Topics   Alcohol use: Yes    Comment: occ   Drug use: No   Family History  Problem Relation Age of Onset   Diabetes Mother    Hypertension Mother    Aneurysm Mother    Stroke Mother    Cancer Mother        cervical cancer   Breast cancer Maternal Aunt    Cancer Maternal Aunt        breast   Cancer Cousin        breast/breast   Lupus Cousin    Heart disease Neg Hx    Colon cancer Neg Hx    Allergic rhinitis Neg Hx    Angioedema Neg Hx    Asthma Neg Hx    Atopy Neg Hx    Eczema Neg Hx    Immunodeficiency Neg Hx    Urticaria Neg Hx    Allergies  Allergen Reactions   Lisinopril Swelling   Potassium Chloride  Shortness Of Breath and Swelling    Tolerates IV KCl, reaction only to PO product   Pneumococcal Vaccines Swelling and Other (See Comments)    Reaction:  Swelling at injection site   Vicodin [Hydrocodone-Acetaminophen] Itching and Rash      Review of Systems  Constitutional: Negative.   HENT:  Positive for congestion and sinus pain. Negative for sore throat.        Right ear itching, swelling under right jaw  Eyes: Negative.   Respiratory: Negative.    Cardiovascular: Negative.   Gastrointestinal: Negative.   Genitourinary: Negative.   Musculoskeletal:        Intermittent bilateral foot pain and swelling  Skin:  Positive for itching and rash.       On right earlobe  Neurological: Negative.   Endo/Heme/Allergies: Negative.   Psychiatric/Behavioral: Negative.        Objective:     LMP 06/29/2015 (Exact Date)     Physical Exam Constitutional:      Appearance: Normal appearance. She is obese.  HENT:     Head: Normocephalic.     Right Ear: Tympanic membrane, ear canal and external ear normal.     Left Ear: Tympanic membrane and ear canal normal.     Ears:     Comments: Left outer ear, about 2 cm area of excoriation     Nose: Congestion and rhinorrhea present.     Mouth/Throat:     Mouth: Mucous membranes are moist.     Pharynx: Oropharynx is clear.     Comments: Submandibular gland is what the patient is feeling is normal Eyes:     Conjunctiva/sclera: Conjunctivae normal.     Comments: Fungal changes right earlobe  Cardiovascular:     Rate and Rhythm: Normal rate and regular rhythm.  Pulses: Normal pulses.     Heart sounds: Normal heart sounds.  Pulmonary:     Effort: Pulmonary effort is normal.     Breath sounds: Normal breath sounds.  Abdominal:     General: Bowel sounds are normal.     Palpations: Abdomen is soft.  Musculoskeletal:     Comments: Left plantar callus   Skin:    Comments: Bilateral bases of thumb (dorsal) with multiple erythematous small  papules, no drainage/ulceration   Neurological:     Mental Status: She is alert and oriented to person, place, and time.  Psychiatric:        Mood and Affect: Mood normal.        Behavior: Behavior normal.      No results found for any visits on 01/01/23.      The 10-year ASCVD risk score (Arnett DK, et al., 2019) is: 16.6%    Assessment & Plan:   Problem List Items Addressed This Visit   None Flu vaccine given No follow-ups on file.    Asencion Noble, MD

## 2023-01-06 ENCOUNTER — Other Ambulatory Visit: Payer: Self-pay

## 2023-01-06 ENCOUNTER — Ambulatory Visit: Payer: Self-pay | Admitting: *Deleted

## 2023-01-06 NOTE — Telephone Encounter (Signed)
Reason for Disposition  [1] SEVERE pain (e.g., excruciating, unable to use hand at all) AND [2] not improved after 2 hours of pain medicine  Answer Assessment - Initial Assessment Questions 1. ONSET: "When did the pain start?"     2 nights ago- last night worse 2. LOCATION: "Where is the pain located?"     Right hand- whole hand/fingers 3. PAIN: "How bad is the pain?" (Scale 1-10; or mild, moderate, severe)   - MILD (1-3): doesn't interfere with normal activities   - MODERATE (4-7): interferes with normal activities (e.g., work or school) or awakens from sleep   - SEVERE (8-10): excruciating pain, unable to use hand at all     severe 4. WORK OR EXERCISE: "Has there been any recent work or exercise that involved this part (i.e., hand or wrist) of the body?"     no 5. CAUSE: "What do you think is causing the pain?"     Not sure 6. AGGRAVATING FACTORS: "What makes the pain worse?" (e.g., using computer)     Touching or picking up things 7. OTHER SYMPTOMS: "Do you have any other symptoms?" (e.g., neck pain, swelling, rash, numbness, fever)     Swelling in hand- whole hand  Protocols used: Hand and Wrist Pain-A-AH

## 2023-01-06 NOTE — Telephone Encounter (Signed)
  Chief Complaint: R hand pain/swelling Symptoms: pain and swelling in R hand- previous surgery to that hand Frequency: 2 days- worse today Pertinent Negatives: Patient denies neck pain, rash, numbness, fever Disposition: [] ED /[] Urgent Care (no appt availability in office) / [] Appointment(In office/virtual)/ []  [facility]/ [] Home Care/ [] Refused Recommended Disposition /[x] [facility]/ []  Follow-up with PCP Additional Notes: No in office appointment- advised mobile unit- patient will go

## 2023-01-10 ENCOUNTER — Other Ambulatory Visit: Payer: Self-pay

## 2023-01-14 ENCOUNTER — Other Ambulatory Visit: Payer: Self-pay

## 2023-01-17 ENCOUNTER — Other Ambulatory Visit: Payer: Self-pay

## 2023-01-20 ENCOUNTER — Other Ambulatory Visit: Payer: Self-pay

## 2023-02-03 ENCOUNTER — Telehealth: Payer: Self-pay | Admitting: Critical Care Medicine

## 2023-02-03 NOTE — Telephone Encounter (Signed)
Contacted Sydney Williams to schedule their annual wellness visit. Welcome to Medicare visit Due by 10/29/23.  [name] AWV direct phone # [phone]   WTM before 10/29/23 per [facility]

## 2023-02-11 ENCOUNTER — Other Ambulatory Visit: Payer: Self-pay

## 2023-02-12 ENCOUNTER — Other Ambulatory Visit: Payer: Self-pay

## 2023-02-14 ENCOUNTER — Other Ambulatory Visit: Payer: Self-pay

## 2023-02-16 ENCOUNTER — Emergency Department (HOSPITAL_COMMUNITY): Payer: 59

## 2023-02-16 ENCOUNTER — Emergency Department (HOSPITAL_COMMUNITY)
Admission: EM | Admit: 2023-02-16 | Discharge: 2023-02-16 | Disposition: A | Discharge status: Home / Self Care | Payer: 59 | Attending: Emergency Medicine | Admitting: Emergency Medicine

## 2023-02-16 ENCOUNTER — Other Ambulatory Visit: Payer: Self-pay

## 2023-02-16 ENCOUNTER — Encounter (HOSPITAL_COMMUNITY): Payer: Self-pay

## 2023-02-16 DIAGNOSIS — Z7982 Long term (current) use of aspirin: Secondary | ICD-10-CM | POA: Diagnosis not present

## 2023-02-16 DIAGNOSIS — M79672 Pain in left foot: Secondary | ICD-10-CM

## 2023-02-16 DIAGNOSIS — Z79899 Other long term (current) drug therapy: Secondary | ICD-10-CM | POA: Insufficient documentation

## 2023-02-16 DIAGNOSIS — I10 Essential (primary) hypertension: Secondary | ICD-10-CM | POA: Diagnosis not present

## 2023-02-16 DIAGNOSIS — Z794 Long term (current) use of insulin: Secondary | ICD-10-CM | POA: Diagnosis not present

## 2023-02-16 DIAGNOSIS — M722 Plantar fascial fibromatosis: Secondary | ICD-10-CM | POA: Diagnosis not present

## 2023-02-16 DIAGNOSIS — E119 Type 2 diabetes mellitus without complications: Secondary | ICD-10-CM | POA: Diagnosis not present

## 2023-02-16 DIAGNOSIS — Z7984 Long term (current) use of oral hypoglycemic drugs: Secondary | ICD-10-CM | POA: Insufficient documentation

## 2023-02-16 MED ORDER — KETOROLAC TROMETHAMINE 15 MG/ML IJ SOLN
15.0000 mg | Freq: Once | INTRAMUSCULAR | Status: AC
  Administered 2023-02-16: 15 mg via INTRAMUSCULAR
  Filled 2023-02-16: qty 1

## 2023-02-16 MED ORDER — IBUPROFEN 600 MG PO TABS
600.0000 mg | ORAL_TABLET | Freq: Three times a day (TID) | ORAL | 0 refills | Status: DC | PRN
  Filled 2023-02-16: qty 15, 5d supply, fill #0

## 2023-02-16 NOTE — Discharge Instructions (Signed)
Thank you for letting us take care of you today.  Your foot x-ray was negative.  Please follow-up with your PCP and/your podiatry or the foot specialist provided above for further evaluation if you continue to have symptoms.  Your exam is most consistent with plantar fasciitis which I have attached information about.  Please take ibuprofen as prescribed to help with the symptoms over the next few days.  You may also benefit from shoe inserts which you can buy at many stores.  If you develop any new or worsening symptoms such as fever, vomiting, chest pain, severe back pain, shortness of breath, or other new, concerning symptoms, please return to the nearest emergency department for reevaluation.

## 2023-02-16 NOTE — ED Triage Notes (Signed)
C/o left heel pain, tender on palpitation radiating into left leg and numbness to toes on left foot x1 day.  Denies fall/trauma.  Decreased ROM.

## 2023-02-16 NOTE — ED Provider Notes (Signed)
[place] EMERGENCY DEPARTMENT AT [facility] Provider Note   CSN: [id number] Arrival date & time: 02/16/23  7829     History  Chief Complaint  Patient presents with   Foot Pain    Sydney Williams is a 57 y.o. female with past medical history hypertension, hyperlipidemia, type 2 diabetes who presents to the ED complaining of left foot pain.  She reports that she woke up yesterday morning with this pain.  She denies fall, twisting, or injury to the foot.  No history of this pain.  Reports that pain is worse to the plantar surface of the foot particularly at the heel and seems to radiate up the back of the leg.  No recent surgery, recent travel, hormone use, or history of DVT/PE.  No focal calf pain.  No leg swelling.  No recent change in activity level.  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  She denies associated fever, chills, nausea, vomiting, chest pain, shortness of breath, palpitations, or other symptoms.      Home Medications Prior to Admission medications   Medication Sig Start Date End Date Taking? Authorizing Provider  ibuprofen (ADVIL) 600 MG tablet Take 1 tablet (600 mg total) by mouth every 8 (eight) hours as needed for up to 5 days for mild pain or moderate pain. 02/16/23 02/21/23 Yes Haeli Gerlich L, PA-C  acetaminophen (TYLENOL) 500 MG tablet Take 1,000 mg by mouth every 6 (six) hours as needed for moderate pain.    [provider]  acyclovir (ZOVIRAX) 200 MG capsule Take 1 capsule (200 mg total) by mouth 2 (two) times daily. 12/26/22   Storm Frisk, MD  albuterol (VENTOLIN HFA) 108 (90 Base) MCG/ACT inhaler Inhale 1-2 puffs into the lungs every 6 (six) hours as needed for wheezing or shortness of breath. 12/26/22   Storm Frisk, MD  amLODipine (NORVASC) 10 MG tablet Take 1 tablet (10 mg total) by mouth daily to lower blood pressure. 12/26/22   Storm Frisk, MD  ascorbic acid (VITAMIN C) 500 MG tablet Take 500 mg by mouth daily.     [provider]  aspirin (EQ ASPIRIN ADULT LOW DOSE) 81 MG EC tablet Take 1 tablet (81 mg total) by mouth daily. Swallow whole. 02/03/18   Tysinger, Kermit Balo, PA-C  atorvastatin (LIPITOR) 40 MG tablet Take 1 tablet (40 mg total) by mouth daily. 12/26/22   Storm Frisk, MD  azelastine (ASTELIN) 0.1 % nasal spray Place 2 sprays into both nostrils 2 (two) times daily. Use in each nostril as directed 12/26/22   Storm Frisk, MD  chlorthalidone (HYGROTON) 25 MG tablet Take 0.5 tablets (12.5 mg total) by mouth daily. 12/26/22   Storm Frisk, MD  clobetasol cream (TEMOVATE) 0.05 % Apply 1 Application topically 2 (two) times daily. 12/26/22   Storm Frisk, MD  clotrimazole (LOTRIMIN) 1 % external solution Apply 1 Application topically 2 (two) times daily. To right ear canal 12/26/22   Storm Frisk, MD  colchicine 0.6 MG tablet Take 1 tablet (0.6 mg total) by mouth daily as needed (gout flare). 12/26/22   Storm Frisk, MD  ferrous sulfate 325 (65 FE) MG tablet Take 325 mg by mouth daily with breakfast.    [provider]  fluticasone (FLONASE) 50 MCG/ACT nasal spray Place 2 sprays into both nostrils daily. 12/26/22   Storm Frisk, MD  Fluticasone-Umeclidin-Vilant 200-62.5-25 MCG/ACT AEPB Inhale 1 puff into the  lungs daily. Rinse mouth after each use. 12/26/22   Storm Frisk, MD  furosemide (LASIX) 20 MG tablet Take 1 tablet (20 mg total) by mouth daily as needed for edema. 12/26/22   Storm Frisk, MD  Insulin Pen Needle (B-D ULTRAFINE III SHORT PEN) 31G X 8 MM MISC Use as needed. 12/26/22   Storm Frisk, MD  metFORMIN (GLUCOPHAGE) 500 MG tablet Take 2 tablets (1,000 mg total) by mouth 2 (two) times daily with a meal. 12/26/22   Storm Frisk, MD  metoprolol tartrate (LOPRESSOR) 50 MG tablet Take 1 tablet (50 mg total) by mouth 2 (two) times daily. 12/26/22   Storm Frisk, MD  nicotine polacrilex (NICORETTE MINI) 4 MG lozenge Use 3 times daily to  stop smoking 09/11/21   Storm Frisk, MD  olopatadine (PATANOL) 0.1 % ophthalmic solution Place 1 drop into both eyes 2 (two) times daily. 12/26/22   Storm Frisk, MD  Semaglutide, 1 MG/DOSE, 4 MG/3ML SOPN Inject 1 mg as directed once a week. 12/26/22   Storm Frisk, MD  Vitamin D, Cholecalciferol, 25 MCG (1000 UT) TABS Take 1 tablet by mouth once daily 02/18/19   Tysinger, Kermit Balo, PA-C      Allergies    Lisinopril, Potassium chloride, Pneumococcal vaccines, and Vicodin [hydrocodone-acetaminophen]    Review of Systems   Review of Systems  Physical Exam Updated Vital Signs BP 138/86 (BP Location: Left Arm)   Pulse 86   Temp 98.6 F (37 C) (Oral)   Resp 18   Ht  (1.803 m)   Wt 108.9 kg   LMP 06/29/2015 (Exact Date)   SpO2 96%   BMI 33.47 kg/m  Physical Exam Vitals and nursing note reviewed.  Constitutional:      General: She is not in acute distress.    Appearance: Normal appearance.  HENT:     Head: Normocephalic and atraumatic.     Mouth/Throat:     Mouth: Mucous membranes are moist.  Eyes:     Conjunctiva/sclera: Conjunctivae normal.  Cardiovascular:     Rate and Rhythm: Normal rate and regular rhythm.     Heart sounds: No murmur heard. Pulmonary:     Effort: Pulmonary effort is normal.     Breath sounds: Normal breath sounds.  Abdominal:     General: Abdomen is flat.     Palpations: Abdomen is soft.  Musculoskeletal:     Cervical back: Neck supple.     Right lower leg: No edema.     Left lower leg: No edema.     Comments: Tenderness diffusely to the plantar surface of the left foot with exquisite tenderness over the heel, no wounds, 2+ DP and PT pulse, normal sensation, no overlying erythema, increased warmth, or other skin changes, stable joint, soft compartments, no signs of injury, no deformity, active plantarflexion and dorsiflexion restricted secondary to pain but passive is intact, no calf tenderness bilaterally, extremities appear warm and  well-perfused; no tenderness over lateral or medial malleolus or remainder of ankle joint bilaterally  Skin:    General: Skin is warm and dry.     Capillary Refill: Capillary refill takes less than 2 seconds.  Neurological:     Mental Status: She is alert. Mental status is at baseline.  Psychiatric:        Behavior: Behavior normal.     ED Results / Procedures / Treatments   Labs (all labs ordered are listed, but only abnormal results  are displayed) Labs Reviewed - No data to display  EKG None  Radiology DG Foot Complete Left  Result Date: 02/16/2023 CLINICAL DATA:  Left foot pain. EXAM: LEFT FOOT - COMPLETE 3+ VIEW COMPARISON:  05/06/2022 FINDINGS: Stable mild degenerative changes. Stable enthesopathic changes at the base of the fifth metatarsal. Small os peroneum noted. Small calcaneal heel spur. No acute bony findings. IMPRESSION: 1. Stable mild degenerative changes. 2. No acute bony findings. Electronically Signed   By: Rudie Meyer M.D.   On: 02/16/2023 09:13    Procedures Procedures    Medications Ordered in ED Medications  ketorolac (TORADOL) 15 MG/ML injection 15 mg (15 mg Intramuscular Given 02/16/23 0830)    ED Course/ Medical Decision Making/ A&P                             Medical Decision Making Amount and/or Complexity of Data Reviewed Radiology: ordered. Decision-making details documented in ED Course.  Risk Prescription drug management.   Medical Decision Making:   MILIANNA ERICSSON is a 57 y.o. female who presented to the ED today with left foot pain detailed above.    Patient's presentation is complicated by their history of multiple comorbidities including diabetes, frequent stress to the foot.  Complete initial physical exam performed, notably the patient was in no acute distress.   No calf tenderness bilaterally.  Diffuse tenderness to the plantar surface of the left foot with exquisite tenderness to the heel.  Neurovascularly intact.  Range of motion  limited secondary to pain.  No increased warmth or erythema.  No wounds to the foot. Reviewed and confirmed nursing documentation for past medical history, family history, social history.    Initial Assessment:   With the patient's presentation of foot pain, differential diagnosis includes but is not limited to ankle strain, ankle sprain, foot sprain, foot strain, plantar fasciitis, fracture, dislocation, compartment syndrome, septic joint, diabetic wound. This is most consistent with an acute complicated illness  Initial Plan:  X-ray to evaluate for bony pathology Symptomatic management Objective evaluation as below reviewed   Initial Study Results:   Radiology:  All images reviewed independently. Agree with radiology report at this time.   DG Foot Complete Left  Result Date: 02/16/2023 CLINICAL DATA:  Left foot pain. EXAM: LEFT FOOT - COMPLETE 3+ VIEW COMPARISON:  05/06/2022 FINDINGS: Stable mild degenerative changes. Stable enthesopathic changes at the base of the fifth metatarsal. Small os peroneum noted. Small calcaneal heel spur. No acute bony findings. IMPRESSION: 1. Stable mild degenerative changes. 2. No acute bony findings. Electronically Signed   By: Rudie Meyer M.D.   On: 02/16/2023 09:13      Final Assessment and Plan:   57 year old female presenting to the ED for evaluation of left foot pain.  No fall or trauma noted.  No history of problems with this foot.  No history of DVT.  No calf tenderness bilaterally.  No risk factors identifiable for DVT.  X-ray negative for acute changes.  No erythema, increased warmth.  Patient afebrile.  No signs of septic joint.  Compartments soft.  Do not suspect compartment syndrome.  Patient most tender at the heel of the left foot and notes that this morning when she got up she had the worst pain.  Symptoms seem most consistent with plantars fasciitis.  Will start her on NSAIDs and refer her to PCP/ankle and foot specialist for further  management.  Strict ED return precautions  given, all questions answered, and stable for discharge.   Clinical Impression:  1. Plantar fasciitis of left foot   2. Foot pain, left      Discharge          Final Clinical Impression(s) / ED Diagnoses Final diagnoses:  Plantar fasciitis of left foot  Foot pain, left    Rx / DC Orders ED Discharge Orders          Ordered    ibuprofen (ADVIL) 600 MG tablet  Every 8 hours PRN        02/16/23 0951              Tonette Lederer, PA-C 02/16/23 1009    Benjiman Core, MD 02/16/23 1514

## 2023-02-17 ENCOUNTER — Ambulatory Visit: Payer: Self-pay | Admitting: *Deleted

## 2023-02-17 ENCOUNTER — Other Ambulatory Visit: Payer: Self-pay

## 2023-02-17 NOTE — Telephone Encounter (Signed)
Summary: Swelling   Swelling in feet, needs hosp fu     Reason for Disposition  [1] SEVERE pain (e.g., excruciating, unable to walk) AND [2] not improved after 2 hours of pain medicine  Answer Assessment - Initial Assessment Questions 1. LOCATION: "Which ankle is swollen?" "Where is the swelling?"     Left foot/leg 2. ONSET: "When did the swelling start?"     Swelling started Saturday am- went to ED- pain 3. SWELLING: "How bad is the swelling?" Or, "How large is it?" (e.g., mild, moderate, severe; size of localized swelling)    - NONE: No joint swelling.   - LOCALIZED: Localized; small area of puffy or swollen skin (e.g., insect bite, skin irritation).   - MILD: Joint looks or feels mildly swollen or puffy.   - MODERATE: Swollen; interferes with normal activities (e.g., work or school); decreased range of movement; may be limping.   - SEVERE: Very swollen; can't move swollen joint at all; limping a lot or unable to walk.     Moderate/severe 4. PAIN: "Is there any pain?" If Yes, ask: "How bad is it?" (Scale 1-10; or mild, moderate, severe)   - NONE (0): no pain.   - MILD (1-3): doesn't interfere with normal activities.    - MODERATE (4-7): interferes with normal activities (e.g., work or school) or awakens from sleep, limping.    - SEVERE (8-10): excruciating pain, unable to do any normal activities, unable to walk.      severe 5. CAUSE: "What do you think caused the ankle swelling?"     Planter fascitis diagnosed- given antiinflammatory 6. OTHER SYMPTOMS: "Do you have any other symptoms?" (e.g., fever, chest pain, difficulty breathing, calf pain)     Swelling into leg  Protocols used: Ankle Swelling-A-AH

## 2023-02-17 NOTE — Telephone Encounter (Signed)
  Chief Complaint: foot/ankle pain/swelling Symptoms: swelling/pain in left foot- patient was seen at ED- diagnosed planter fascitis- patient is requesting appointment- worsening symptoms- swelling into leg Frequency: swelling stated Saturday Pertinent Negatives: Patient denies fever, chest pain, difficulty breathing, calf pain Disposition: ED /[] Urgent Care (no appt availability in office) / Appointment(In office/virtual)/  [place] [facility]/ Home Care/ Refused Recommended Disposition /[] [place] [facility]/  Follow-up with PCP Additional Notes: Patient needs follow up with provider- worsening symptoms. No response at office number- message sent to [facility] for assistance in scheduling- patient aware.

## 2023-02-17 NOTE — Telephone Encounter (Signed)
Spoke with patient . Verified name & DOB . Patient voiced that she is continuing  to have swelling of her leg and pain 5 on 1-10 scale. Patient given for 02/22/2023

## 2023-02-22 ENCOUNTER — Ambulatory Visit: Payer: 59 | Attending: Family | Admitting: Family

## 2023-02-22 ENCOUNTER — Encounter: Payer: Self-pay | Admitting: Family

## 2023-02-22 VITALS — BP 138/86 | HR 67 | Resp 16 | Wt 242.4 lb

## 2023-02-22 DIAGNOSIS — M79672 Pain in left foot: Secondary | ICD-10-CM | POA: Diagnosis not present

## 2023-02-22 DIAGNOSIS — Z09 Encounter for follow-up examination after completed treatment for conditions other than malignant neoplasm: Secondary | ICD-10-CM | POA: Diagnosis not present

## 2023-02-22 MED ORDER — IBUPROFEN 600 MG PO TABS
600.0000 mg | ORAL_TABLET | Freq: Three times a day (TID) | ORAL | 0 refills | Status: AC | PRN
Start: 2023-02-22 — End: 2023-03-01

## 2023-02-22 NOTE — Progress Notes (Signed)
Patient has been counseled on age-appropriate routine health concerns for screening and prevention. These are reviewed and up-to-date. Referrals have been placed accordingly. Immunizations are up-to-date or declined.    Subjective:  Patient is a 57 year old female here for hospital discharge follow up. She has pain and swelling to the left foot x 1 week. Patient reports she woke up with pain and swelling to left foot on Saturday of last week. She went to the local ED on Sunday. X-ray was negative for blood clot and she was discharged with Ibuprofen as needed, which is helping with her symtpoms.  She reports no fracture to the extremity in the past.  She is currently using Crutches to walk. Pain is worse with ambulation.  Reports no fever, chest pain, headache, or any other symptom than above.  Review of Systems  Constitutional: Negative.   Respiratory: Negative.    Cardiovascular: Negative.   Musculoskeletal:        Pain and swelling to left foot  Neurological: Negative.   Psychiatric/Behavioral: Negative.      Past Medical History:  Diagnosis Date   Asthma    2015 hospitaliation for asthma   Diabetes mellitus    Type II   Eczema 05/16/2022   Family history of breast cancer    Fibroids 2010   Genital herpes    Genital herpes 11/30/2014   GERD (gastroesophageal reflux disease)    Hyperlipidemia    Hypertension    Impaired fasting blood sugar    Migraines    Obesity    Right carpal tunnel syndrome    Smoker     Past Surgical History:  Procedure Laterality Date   BACK SURGERY  10/24/2020   CARDIAC CATHETERIZATION     CARPAL TUNNEL RELEASE Right 08/30/2020   Procedure: RIGHT CARPAL TUNNEL RELEASE;  Surgeon: Tarry Kos, MD;  Location: [facility];  Service: Orthopedics;  Laterality: Right;   COLONOSCOPY  03/13/2016   Dr. Leone Payor, normal, repeat 2027   CYST EXCISION     left neck/postauricular region, benign   DILATION AND CURETTAGE OF UTERUS     LAPAROSCOPIC  ABDOMINAL EXPLORATION     removal of ectopic preg   LEFT HEART CATH AND CORONARY ANGIOGRAPHY N/A 06/23/2017   Procedure: LEFT HEART CATH AND CORONARY ANGIOGRAPHY;  Surgeon: Yvonne Kendall, MD;  Location: [facility];  Service: Cardiovascular;  Laterality: N/A;   TUBAL LIGATION      Family History  Problem Relation Age of Onset   Diabetes Mother    Hypertension Mother    Aneurysm Mother    Stroke Mother    Cancer Mother        cervical cancer   Breast cancer Maternal Aunt    Cancer Maternal Aunt        breast   Cancer Cousin        breast/breast   Lupus Cousin    Heart disease Neg Hx    Colon cancer Neg Hx    Allergic rhinitis Neg Hx    Angioedema Neg Hx    Asthma Neg Hx    Atopy Neg Hx    Eczema Neg Hx    Immunodeficiency Neg Hx    Urticaria Neg Hx     Social History Reviewed with no changes to be made today.   Outpatient Medications Prior to Visit  Medication Sig Dispense Refill   acetaminophen (TYLENOL) 500 MG tablet Take 1,000 mg by mouth every 6 (six) hours as  needed for moderate pain.     acyclovir (ZOVIRAX) 200 MG capsule Take 1 capsule (200 mg total) by mouth 2 (two) times daily. 60 capsule 1   albuterol (VENTOLIN HFA) 108 (90 Base) MCG/ACT inhaler Inhale 1-2 puffs into the lungs every 6 (six) hours as needed for wheezing or shortness of breath. 18 g 0   amLODipine (NORVASC) 10 MG tablet Take 1 tablet (10 mg total) by mouth daily to lower blood pressure. 90 tablet 2   ascorbic acid (VITAMIN C) 500 MG tablet Take 500 mg by mouth daily.     aspirin (EQ ASPIRIN ADULT LOW DOSE) 81 MG EC tablet Take 1 tablet (81 mg total) by mouth daily. Swallow whole. 90 tablet 3   atorvastatin (LIPITOR) 40 MG tablet Take 1 tablet (40 mg total) by mouth daily. 90 tablet 2   azelastine (ASTELIN) 0.1 % nasal spray Place 2 sprays into both nostrils 2 (two) times daily. Use in each nostril as directed 30 mL 12   chlorthalidone (HYGROTON) 25 MG tablet Take 0.5 tablets (12.5 mg total)  by mouth daily. 45 tablet 2   clobetasol cream (TEMOVATE) 0.05 % Apply 1 Application topically 2 (two) times daily. 30 g 0   clotrimazole (LOTRIMIN) 1 % external solution Apply 1 Application topically 2 (two) times daily. To right ear canal 30 mL 0   colchicine 0.6 MG tablet Take 1 tablet (0.6 mg total) by mouth daily as needed (gout flare). 30 tablet 2   ferrous sulfate 325 (65 FE) MG tablet Take 325 mg by mouth daily with breakfast.     fluticasone (FLONASE) 50 MCG/ACT nasal spray Place 2 sprays into both nostrils daily. 16 g 6   Fluticasone-Umeclidin-Vilant 200-62.5-25 MCG/ACT AEPB Inhale 1 puff into the lungs daily. Rinse mouth after each use. 60 each 4   furosemide (LASIX) 20 MG tablet Take 1 tablet (20 mg total) by mouth daily as needed for edema. 30 tablet 1   Insulin Pen Needle (B-D ULTRAFINE III SHORT PEN) 31G X 8 MM MISC Use as needed. 100 each 2   metFORMIN (GLUCOPHAGE) 500 MG tablet Take 2 tablets (1,000 mg total) by mouth 2 (two) times daily with a meal. 360 tablet 1   metoprolol tartrate (LOPRESSOR) 50 MG tablet Take 1 tablet (50 mg total) by mouth 2 (two) times daily. 60 tablet 1   nicotine polacrilex (NICORETTE MINI) 4 MG lozenge Use 3 times daily to stop smoking 100 tablet 4   olopatadine (PATANOL) 0.1 % ophthalmic solution Place 1 drop into both eyes 2 (two) times daily. 5 mL 12   Semaglutide, 1 MG/DOSE, 4 MG/3ML SOPN Inject 1 mg as directed once a week. 3 mL 4   Vitamin D, Cholecalciferol, 25 MCG (1000 UT) TABS Take 1 tablet by mouth once daily 90 tablet 0   ibuprofen (ADVIL) 600 MG tablet Take 1 tablet (600 mg total) by mouth every 8 (eight) hours as needed for up to 5 days for mild pain or moderate pain. 15 tablet 0   No facility-administered medications prior to visit.    Allergies  Allergen Reactions   Lisinopril Swelling   Potassium Chloride Shortness Of Breath and Swelling    Tolerates IV KCl, reaction only to PO product   Pneumococcal Vaccines Swelling and Other  (See Comments)    Reaction:  Swelling at injection site   Vicodin [Hydrocodone-Acetaminophen] Itching and Rash      Objective:    BP 138/86   Pulse 67  Resp 16   Wt 242 lb 6.4 oz (110 kg)   LMP 06/29/2015 (Exact Date)   SpO2 97%   BMI 33.81 kg/m  Wt Readings from Last 3 Encounters:  02/22/23 242 lb 6.4 oz (110 kg)  02/16/23 240 lb (108.9 kg)  12/26/22 238 lb 3.2 oz (108 kg)   Physical Exam Constitutional:      Appearance: Normal appearance.  HENT:     Head: Normocephalic and atraumatic.  Cardiovascular:     Rate and Rhythm: Normal rate and regular rhythm.  Pulmonary:     Effort: Pulmonary effort is normal.     Breath sounds: Normal breath sounds.  Abdominal:     General: Bowel sounds are normal.  Musculoskeletal:        General: Swelling present. Normal range of motion.     Comments: Swelling to left foot appreciated. Pedal pulse present. Skin intact.  Neurological:     Mental Status: She is alert and oriented to person, place, and time.  Psychiatric:        Mood and Affect: Mood normal.        Behavior: Behavior normal.     Assessment & Plan:  Patient is a 57 year old female who is here for hospital follow-up, with pain and swelling to left foot for the past 8 days.  Patient is doing well at home taking ibuprofen as needed and uses crutches to walk.  Plan:  1) Hospital Follow Up:  -Take ibuprofen as needed  -Elevate left foot while sitting  -Referral to physical therapy initiated  -Reminded to report new or worsening symptoms to the clinic or local ED.  -Follow-up in 4 weeks with primary care provider.  2) Foot Pain, Left:  -Elevate foot when sitting.  -Referral to physical therapy initiated.  -Take ibuprofen 600 mg 1 tablet by mouth every 8 hours as needed for pain, take with food.   -Report new or worsening symptoms to the clinic or local ED.  -Follow-up in 4 weeks with PCP.     Patient has been counseled extensively about nutrition and exercise as  well as the importance of adherence with medications and regular follow-up. The patient was given clear instructions to go to ER or return to medical center if symptoms don't improve, worsen or new problems develop. The patient verbalized understanding.    Follow-up: Return in about 4 weeks (around 03/22/2023).   Catarina Hartshorn, DNP, APRN, FNP-C [facility] and [facility] [place], [place] [phone]   02/22/2023, 1:27 PM

## 2023-02-22 NOTE — Patient Instructions (Addendum)
1) I referred you to physical therapy 2) Follow up with your primary care provider in 4 weeks 3) Continue using your Ibuprofen as needed, take the medication with food. Medication refilled.  4) Stay hydrated 5) Elevate your foot when seating and take pressure off the foot as needed. You can use compression sock to affected foot 6) Follow up in 4 weeks with your PCP

## 2023-02-24 ENCOUNTER — Other Ambulatory Visit: Payer: Self-pay

## 2023-03-05 ENCOUNTER — Inpatient Hospital Stay: Payer: 59 | Admitting: Physician Assistant

## 2023-03-05 NOTE — Progress Notes (Deleted)
Patient ID: Sydney Williams, female   DOB: Sep 10, 1966, 57 y.o.   MRN: [id number]    Sydney Williams is a 57 y.o. female with past medical history hypertension, hyperlipidemia, type 2 diabetes who presents to the ED complaining of left foot pain.  She reports that she woke up yesterday morning with this pain.  She denies fall, twisting, or injury to the foot.  No history of this pain.  Reports that pain is worse to the plantar surface of the foot particularly at the heel and seems to radiate up the back of the leg.  No recent surgery, recent travel, hormone use, or history of DVT/PE.  No focal calf pain.  No leg swelling.  No recent change in activity level.  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  She denies associated fever, chills, nausea, vomiting, chest pain, shortness of breath, palpitations, or other symptoms.   With the patient's presentation of foot pain, differential diagnosis includes but is not limited to ankle strain, ankle sprain, foot sprain, foot strain, plantar fasciitis, fracture, dislocation, compartment syndrome, septic joint, diabetic wound. This is most consistent with an acute complicated illness   Initial Plan:  X-ray to evaluate for bony pathology Symptomatic management Objective evaluation as below reviewed    Final Assessment and Plan:   57 year old female presenting to the ED for evaluation of left foot pain.  No fall or trauma noted.  No history of problems with this foot.  No history of DVT.  No calf tenderness bilaterally.  No risk factors identifiable for DVT.  X-ray negative for acute changes.  No erythema, increased warmth.  Patient afebrile.  No signs of septic joint.  Compartments soft.  Do not suspect compartment syndrome.  Patient most tender at the heel of the left foot and notes that this morning when she got up she had the worst pain.  Symptoms seem most consistent with plantars fasciitis.  Will start her on NSAIDs and refer her to PCP/ankle and foot  specialist for further management.  Strict ED return precautions given, all questions answered, and stable for discharge.     Clinical Impression:  1. Plantar fasciitis of left foot   2. Foot pain, left

## 2023-03-10 ENCOUNTER — Other Ambulatory Visit: Payer: Self-pay

## 2023-03-12 ENCOUNTER — Other Ambulatory Visit: Payer: Self-pay | Admitting: Critical Care Medicine

## 2023-03-12 DIAGNOSIS — Z1231 Encounter for screening mammogram for malignant neoplasm of breast: Secondary | ICD-10-CM

## 2023-03-13 ENCOUNTER — Other Ambulatory Visit: Payer: Self-pay

## 2023-03-13 ENCOUNTER — Other Ambulatory Visit: Payer: Self-pay | Admitting: Critical Care Medicine

## 2023-03-13 DIAGNOSIS — I10 Essential (primary) hypertension: Secondary | ICD-10-CM

## 2023-03-13 MED ORDER — ACYCLOVIR 200 MG PO CAPS
200.0000 mg | ORAL_CAPSULE | Freq: Two times a day (BID) | ORAL | 2 refills | Status: DC
  Filled 2023-03-13: qty 60, 30d supply, fill #0
  Filled 2023-04-29: qty 60, 30d supply, fill #1

## 2023-03-13 MED ORDER — METOPROLOL TARTRATE 50 MG PO TABS
50.0000 mg | ORAL_TABLET | Freq: Two times a day (BID) | ORAL | 2 refills | Status: DC
Start: 2023-03-13 — End: 2023-04-30
  Filled 2023-03-13: qty 60, 30d supply, fill #0
  Filled 2023-04-26: qty 60, 30d supply, fill #1

## 2023-03-13 NOTE — Telephone Encounter (Signed)
Requested Prescriptions  Pending Prescriptions Disp Refills   acyclovir (ZOVIRAX) 200 MG capsule 60 capsule 2    Sig: Take 1 capsule (200 mg total) by mouth 2 (two) times daily.     Antimicrobials:  Antiviral Agents - Anti-Herpetic Passed - 03/13/2023  6:45 AM      Passed - Valid encounter within last 12 months    Recent Outpatient Visits           2 weeks ago Hospital discharge follow-up   [facility] [place], Jomarie Longs, FNP   2 months ago Type 2 diabetes mellitus without complication, without long-term current use of insulin [facility])   [place] [facility] Storm Frisk, [place]   5 months ago Acute non-recurrent maxillary sinusitis   [place] [place], Marylene [place], [place]   6 months ago Gout involving toe of right foot, unspecified cause, unspecified chronicity   [place] [facility] [place], [place], [place]   10 months ago Type 2 diabetes mellitus without complication, without long-term current use of insulin [facility])   [place] [facility] Storm Frisk, MD       Future Appointments             In 1 month Storm Frisk, MD [facility]             metoprolol tartrate (LOPRESSOR) 50 MG tablet 60 tablet 2    Sig: Take 1 tablet (50 mg total) by mouth 2 (two) times daily.     Cardiovascular:  Beta Blockers Passed - 03/13/2023  6:45 AM      Passed - Last BP in normal range    BP Readings from Last 1 Encounters:  02/22/23 138/86         Passed - Last Heart Rate in normal range    Pulse Readings from Last 1 Encounters:  02/22/23 67         Passed - Valid encounter within last 6 months    Recent Outpatient Visits           2 weeks ago Hospital discharge follow-up   [facility] [place], Jomarie Longs, FNP   2 months ago Type 2 diabetes mellitus without complication,  without long-term current use of insulin [facility])   [place] [facility] Storm Frisk, MD   5 months ago Acute non-recurrent maxillary sinusitis   [place] [facility] [place], [place], [place]   6 months ago Gout involving toe of right foot, unspecified cause, unspecified chronicity   [place] [facility] [place], [place], FNP   10 months ago Type 2 diabetes mellitus without complication, without long-term current use of insulin [facility])   [place] [facility] Storm Frisk, MD       Future Appointments             In 1 month Delford Field Charlcie Cradle, MD [facility]

## 2023-03-14 ENCOUNTER — Other Ambulatory Visit: Payer: Self-pay

## 2023-03-17 NOTE — Therapy (Deleted)
OUTPATIENT PHYSICAL THERAPY LOWER EXTREMITY EVALUATION   Patient Name: Sydney Williams MRN: [id number] DOB:1965-11-28, 57 y.o., female Today's Date: 03/17/2023  END OF SESSION:   Past Medical History:  Diagnosis Date   Asthma    2015 hospitaliation for asthma   Diabetes mellitus    Type II   Eczema 05/16/2022   Family history of breast cancer    Fibroids 2010   Genital herpes    Genital herpes 11/30/2014   GERD (gastroesophageal reflux disease)    Hyperlipidemia    Hypertension    Impaired fasting blood sugar    Migraines    Obesity    Right carpal tunnel syndrome    Smoker    Past Surgical History:  Procedure Laterality Date   BACK SURGERY  10/24/2020   CARDIAC CATHETERIZATION     CARPAL TUNNEL RELEASE Right 08/30/2020   Procedure: RIGHT CARPAL TUNNEL RELEASE;  Surgeon: Tarry Kos, MD;  Location:  SURGERY CENTER;  Service: Orthopedics;  Laterality: Right;   COLONOSCOPY  03/13/2016   Dr. Leone Payor, normal, repeat 2027   CYST EXCISION     left neck/postauricular region, benign   DILATION AND CURETTAGE OF UTERUS     LAPAROSCOPIC ABDOMINAL EXPLORATION     removal of ectopic preg   LEFT HEART CATH AND CORONARY ANGIOGRAPHY N/A 06/23/2017   Procedure: LEFT HEART CATH AND CORONARY ANGIOGRAPHY;  Surgeon: Yvonne Kendall, MD;  [facility];  Service: Cardiovascular;  Laterality: N/A;   TUBAL LIGATION     Patient Active Problem List   Diagnosis Date Noted   Acute gout involving toe of right foot 07/03/2022   Foot callus 05/16/2022   Breast cyst, right 05/16/2022   Eczema 05/16/2022   Aortic atherosclerosis (HCC) 11/15/2021   Otomycosis of right ear 07/04/2021   Degenerative disc disease, lumbar    History of lumbar spinal fusion 10/24/2020   Seasonal and perennial allergic rhinoconjunctivitis 09/18/2020   Carpal tunnel syndrome on right    Seronegative rheumatoid arthritis (HCC) 08/04/2020   Allergic conjunctivitis of both eyes 07/19/2020    Lumbar radiculopathy, chronic 04/03/2020   Elevated blood uric acid level 04/03/2020   Family history of cervical cancer 02/02/2018   Family history of breast cancer 02/02/2018   Vitamin D deficiency 02/02/2018   Depression 06/19/2017   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 01/08/2017   Snoring 09/11/2016   History of migraine 09/11/2016   Frequent headaches 09/11/2016   Gastroesophageal reflux disease without esophagitis 06/01/2015   Other allergic rhinitis 06/01/2015   Tobacco user 11/30/2014   Essential hypertension 11/30/2014   Hyperlipidemia 11/30/2014   Asthma, moderate persistent 11/30/2014   Obesity 07/02/2012   Type 2 diabetes mellitus without complication, without long-term current use of insulin (HCC) 07/02/2012    PCP: Storm Frisk, MD   REFERRING PROVIDER: Eleonore Chiquito, FNP  REFERRING DIAG: [phone] (ICD-10-CM) - Foot pain, left  THERAPY DIAG:  No diagnosis found.  Rationale for Evaluation and Treatment: Rehabilitation  ONSET DATE: 02/16/23  SUBJECTIVE:   SUBJECTIVE STATEMENT: Patient is a 57 year old female here for hospital discharge follow up. She has pain and swelling to the left foot x 1 week. Patient reports she woke up with pain and swelling to left foot on Saturday of last week. She went to the local ED on Sunday. X-ray was negative for blood clot and she was discharged with Ibuprofen as needed, which is helping with her symtpoms.  She reports no fracture to the extremity in  the past.  She is currently using Crutches to walk. Pain is worse with ambulation.  Reports no fever, chest pain, headache, or any other symptom than above.  PERTINENT HISTORY: DM PAIN:  Are you having pain? {OPRCPAIN:27236}  PRECAUTIONS: None  WEIGHT BEARING RESTRICTIONS: No  FALLS:  Has patient fallen in last 6 months? No  OCCUPATION: ***  PLOF: Independent  PATIENT GOALS: ***  NEXT MD VISIT: 04/30/23  OBJECTIVE:   DIAGNOSTIC FINDINGS: CLINICAL DATA:  Left  foot pain.   EXAM: LEFT FOOT - COMPLETE 3+ VIEW   COMPARISON:  05/06/2022   FINDINGS: Stable mild degenerative changes. Stable enthesopathic changes at the base of the fifth metatarsal. Small os peroneum noted. Small calcaneal heel spur. No acute bony findings.   IMPRESSION: 1. Stable mild degenerative changes. 2. No acute bony findings.     Electronically Signed   By: Rudie Meyer M.D.   On: 02/16/2023 09:13  PATIENT SURVEYS:  FOTO ***  MUSCLE LENGTH: Hamstrings: Right *** deg; Left *** deg Maisie Fus test: Right *** deg; Left *** deg  POSTURE: {posture:25561}  PALPATION: ***  LOWER EXTREMITY ROM:  {AROM/PROM:27142} ROM Right eval Left eval  Hip flexion    Hip extension    Hip abduction    Hip adduction    Hip internal rotation    Hip external rotation    Knee flexion    Knee extension    Ankle dorsiflexion    Ankle plantarflexion    Ankle inversion    Ankle eversion     (Blank rows = not tested)  LOWER EXTREMITY MMT:  MMT Right eval Left eval  Hip flexion    Hip extension    Hip abduction    Hip adduction    Hip internal rotation    Hip external rotation    Knee flexion    Knee extension    Ankle dorsiflexion    Ankle plantarflexion    Ankle inversion    Ankle eversion     (Blank rows = not tested)  LOWER EXTREMITY SPECIAL TESTS:  Ankle special tests: {ANKLE SPECIAL TESTS:26241}  FUNCTIONAL TESTS:  30 seconds chair stand test  GAIT: Distance walked: 54ft x2 Assistive device utilized: {Assistive devices:23999} Level of assistance: {Levels of assistance:24026} Comments: ***   TODAY'S TREATMENT:                                                                                                                              DATE: ***    PATIENT EDUCATION:  Education details: Discussed eval findings, rehab rationale and POC and patient is in agreement  Person educated: Patient Education method: Explanation Education comprehension:  verbalized understanding and needs further education  HOME EXERCISE PROGRAM: ***  ASSESSMENT:  CLINICAL IMPRESSION: Patient is a *** y.o. *** who was seen today for physical therapy evaluation and treatment for ***.   OBJECTIVE IMPAIRMENTS: {opptimpairments:25111}.   ACTIVITY LIMITATIONS: {activitylimitations:27494}  PERSONAL FACTORS: Fitness and 1-2  comorbidities: DM and obesity  are also affecting patient's functional outcome.   REHAB POTENTIAL: Good  CLINICAL DECISION MAKING: Evolving/moderate complexity  EVALUATION COMPLEXITY: Low   GOALS: Goals reviewed with patient? No  SHORT TERM GOALS: Target date: *** *** Baseline: Goal status: {GOALSTATUS:25110}  2.  *** Baseline:  Goal status: {GOALSTATUS:25110}  3.  *** Baseline:  Goal status: {GOALSTATUS:25110}  4.  *** Baseline:  Goal status: {GOALSTATUS:25110}  5.  *** Baseline:  Goal status: {GOALSTATUS:25110}  6.  *** Baseline:  Goal status: {GOALSTATUS:25110}  LONG TERM GOALS: Target date: ***  *** Baseline:  Goal status: {GOALSTATUS:25110}  2.  *** Baseline:  Goal status: {GOALSTATUS:25110}  3.  *** Baseline:  Goal status: {GOALSTATUS:25110}  4.  *** Baseline:  Goal status: {GOALSTATUS:25110}  5.  *** Baseline:  Goal status: {GOALSTATUS:25110}  6.  *** Baseline:  Goal status: {GOALSTATUS:25110}   PLAN:  PT FREQUENCY: {rehab frequency:25116}  PT DURATION: {rehab duration:25117}  PLANNED INTERVENTIONS: {rehab planned interventions:25118::"Therapeutic exercises","Therapeutic activity","Neuromuscular re-education","Balance training","Gait training","Patient/Family education","Self Care","Joint mobilization"}  PLAN FOR NEXT SESSION: ***   Hildred Laser, PT 03/17/2023, 2:26 PM

## 2023-03-20 ENCOUNTER — Ambulatory Visit: Payer: 59 | Attending: Family

## 2023-03-22 ENCOUNTER — Ambulatory Visit: Payer: 59 | Admitting: Family

## 2023-03-28 ENCOUNTER — Ambulatory Visit
Admission: RE | Admit: 2023-03-28 | Discharge: 2023-03-28 | Disposition: A | Discharge status: Home / Self Care | Payer: 59 | Source: Ambulatory Visit | Attending: Critical Care Medicine | Admitting: Critical Care Medicine

## 2023-03-28 DIAGNOSIS — Z1231 Encounter for screening mammogram for malignant neoplasm of breast: Secondary | ICD-10-CM

## 2023-04-02 ENCOUNTER — Telehealth: Payer: Self-pay | Admitting: Critical Care Medicine

## 2023-04-02 NOTE — Telephone Encounter (Signed)
Call the patient tell her she needs to schedule her mammogram help with this if needed

## 2023-04-02 NOTE — Telephone Encounter (Signed)
Patient has appointment already scheduled.

## 2023-04-14 ENCOUNTER — Other Ambulatory Visit: Payer: Self-pay

## 2023-04-15 ENCOUNTER — Ambulatory Visit: Payer: 59

## 2023-04-16 ENCOUNTER — Ambulatory Visit
Admission: RE | Admit: 2023-04-16 | Discharge: 2023-04-16 | Disposition: A | Discharge status: Home / Self Care | Payer: 59 | Source: Ambulatory Visit | Attending: Critical Care Medicine | Admitting: Critical Care Medicine

## 2023-04-16 DIAGNOSIS — Z1231 Encounter for screening mammogram for malignant neoplasm of breast: Secondary | ICD-10-CM | POA: Diagnosis not present

## 2023-04-19 NOTE — Progress Notes (Signed)
Let pt know normal mammogram

## 2023-04-23 ENCOUNTER — Telehealth: Payer: Self-pay

## 2023-04-23 NOTE — Telephone Encounter (Signed)
Pt was called and is aware of results, DOB was confirmed.  ?

## 2023-04-23 NOTE — Telephone Encounter (Signed)
-----   Message from Storm Frisk, MD sent at 04/19/2023  6:51 AM EDT ----- Let pt know normal mammogram

## 2023-04-30 ENCOUNTER — Other Ambulatory Visit: Payer: Self-pay | Admitting: Pharmacist

## 2023-04-30 ENCOUNTER — Other Ambulatory Visit: Payer: Self-pay

## 2023-04-30 ENCOUNTER — Encounter: Payer: Self-pay | Admitting: Critical Care Medicine

## 2023-04-30 ENCOUNTER — Ambulatory Visit: Payer: 59 | Admitting: Physician Assistant

## 2023-04-30 ENCOUNTER — Ambulatory Visit: Payer: 59 | Attending: Critical Care Medicine | Admitting: Critical Care Medicine

## 2023-04-30 ENCOUNTER — Other Ambulatory Visit (HOSPITAL_COMMUNITY)
Admission: RE | Admit: 2023-04-30 | Discharge: 2023-04-30 | Disposition: A | Discharge status: Home / Self Care | Payer: 59 | Source: Ambulatory Visit | Attending: Critical Care Medicine | Admitting: Critical Care Medicine

## 2023-04-30 VITALS — BP 115/65 | HR 79 | Temp 98.9°F | Ht 71.0 in | Wt 244.0 lb

## 2023-04-30 DIAGNOSIS — Z720 Tobacco use: Secondary | ICD-10-CM

## 2023-04-30 DIAGNOSIS — Z01411 Encounter for gynecological examination (general) (routine) with abnormal findings: Secondary | ICD-10-CM | POA: Insufficient documentation

## 2023-04-30 DIAGNOSIS — B3731 Acute candidiasis of vulva and vagina: Secondary | ICD-10-CM | POA: Diagnosis not present

## 2023-04-30 DIAGNOSIS — Z7985 Long-term (current) use of injectable non-insulin antidiabetic drugs: Secondary | ICD-10-CM

## 2023-04-30 DIAGNOSIS — R21 Rash and other nonspecific skin eruption: Secondary | ICD-10-CM | POA: Diagnosis not present

## 2023-04-30 DIAGNOSIS — I10 Essential (primary) hypertension: Secondary | ICD-10-CM

## 2023-04-30 DIAGNOSIS — Z833 Family history of diabetes mellitus: Secondary | ICD-10-CM | POA: Insufficient documentation

## 2023-04-30 DIAGNOSIS — E669 Obesity, unspecified: Secondary | ICD-10-CM

## 2023-04-30 DIAGNOSIS — E119 Type 2 diabetes mellitus without complications: Secondary | ICD-10-CM | POA: Diagnosis not present

## 2023-04-30 DIAGNOSIS — N898 Other specified noninflammatory disorders of vagina: Secondary | ICD-10-CM | POA: Insufficient documentation

## 2023-04-30 DIAGNOSIS — Z79899 Other long term (current) drug therapy: Secondary | ICD-10-CM | POA: Diagnosis not present

## 2023-04-30 DIAGNOSIS — J4540 Moderate persistent asthma, uncomplicated: Secondary | ICD-10-CM

## 2023-04-30 DIAGNOSIS — Z124 Encounter for screening for malignant neoplasm of cervix: Secondary | ICD-10-CM | POA: Insufficient documentation

## 2023-04-30 DIAGNOSIS — M109 Gout, unspecified: Secondary | ICD-10-CM | POA: Diagnosis not present

## 2023-04-30 DIAGNOSIS — I700 Atherosclerosis of aorta: Secondary | ICD-10-CM | POA: Diagnosis not present

## 2023-04-30 DIAGNOSIS — F17210 Nicotine dependence, cigarettes, uncomplicated: Secondary | ICD-10-CM

## 2023-04-30 DIAGNOSIS — Z7984 Long term (current) use of oral hypoglycemic drugs: Secondary | ICD-10-CM

## 2023-04-30 DIAGNOSIS — Z6834 Body mass index (BMI) 34.0-34.9, adult: Secondary | ICD-10-CM

## 2023-04-30 DIAGNOSIS — E782 Mixed hyperlipidemia: Secondary | ICD-10-CM

## 2023-04-30 DIAGNOSIS — Z8249 Family history of ischemic heart disease and other diseases of the circulatory system: Secondary | ICD-10-CM | POA: Insufficient documentation

## 2023-04-30 LAB — GLUCOSE, POCT (MANUAL RESULT ENTRY): POC Glucose: 98 mg/dl (ref 70–99)

## 2023-04-30 MED ORDER — AMLODIPINE BESYLATE 10 MG PO TABS
10.0000 mg | ORAL_TABLET | Freq: Every day | ORAL | 2 refills | Status: DC
  Filled 2023-04-30 – 2023-06-15 (×2): qty 90, 90d supply, fill #0
  Filled 2023-09-12: qty 90, 90d supply, fill #1

## 2023-04-30 MED ORDER — SEMAGLUTIDE (1 MG/DOSE) 4 MG/3ML ~~LOC~~ SOPN
1.0000 mg | PEN_INJECTOR | SUBCUTANEOUS | 4 refills | Status: DC
  Filled 2023-04-30 – 2023-05-07 (×2): qty 3, 28d supply, fill #0
  Filled 2023-06-03: qty 3, 28d supply, fill #1
  Filled 2023-07-02: qty 3, 28d supply, fill #2
  Filled 2023-07-30: qty 3, 28d supply, fill #3
  Filled 2023-08-27: qty 3, 28d supply, fill #4

## 2023-04-30 MED ORDER — FLUTICASONE-UMECLIDIN-VILANT 200-62.5-25 MCG/ACT IN AEPB
1.0000 | INHALATION_SPRAY | Freq: Every day | RESPIRATORY_TRACT | 4 refills | Status: AC
  Filled 2023-04-30: qty 60, 30d supply, fill #0
  Filled 2023-05-27: qty 60, 30d supply, fill #1

## 2023-04-30 MED ORDER — NICOTINE POLACRILEX 2 MG MT GUM
CHEWING_GUM | OROMUCOSAL | 0 refills | Status: DC
  Filled 2023-04-30: qty 50, 17d supply, fill #0

## 2023-04-30 MED ORDER — ATORVASTATIN CALCIUM 40 MG PO TABS
40.0000 mg | ORAL_TABLET | Freq: Every day | ORAL | 2 refills | Status: DC
  Filled 2023-04-30 – 2023-05-27 (×2): qty 90, 90d supply, fill #0
  Filled 2023-08-16: qty 90, 90d supply, fill #1

## 2023-04-30 MED ORDER — ACYCLOVIR 200 MG PO CAPS
200.0000 mg | ORAL_CAPSULE | Freq: Two times a day (BID) | ORAL | 2 refills | Status: DC
  Filled 2023-05-27: qty 60, 30d supply, fill #0
  Filled 2023-07-05: qty 60, 30d supply, fill #1
  Filled 2023-08-16: qty 60, 30d supply, fill #2

## 2023-04-30 MED ORDER — ALLOPURINOL 100 MG PO TABS
100.0000 mg | ORAL_TABLET | Freq: Every day | ORAL | 6 refills | Status: DC
  Filled 2023-04-30: qty 30, 30d supply, fill #0
  Filled 2023-05-27: qty 30, 30d supply, fill #1
  Filled 2023-06-25: qty 30, 30d supply, fill #2
  Filled 2023-07-27: qty 30, 30d supply, fill #3
  Filled 2023-08-16 – 2023-08-19 (×2): qty 30, 30d supply, fill #4

## 2023-04-30 MED ORDER — CHLORTHALIDONE 25 MG PO TABS
12.5000 mg | ORAL_TABLET | Freq: Every day | ORAL | 2 refills | Status: DC
  Filled 2023-07-14: qty 45, 90d supply, fill #0

## 2023-04-30 MED ORDER — COLCHICINE 0.6 MG PO TABS
0.6000 mg | ORAL_TABLET | Freq: Every day | ORAL | 2 refills | Status: DC | PRN
Start: 2023-04-30 — End: 2023-12-19
  Filled 2023-04-30: qty 30, 30d supply, fill #0
  Filled 2023-06-22: qty 30, 30d supply, fill #1
  Filled 2023-07-14 – 2023-07-15 (×2): qty 30, 30d supply, fill #2

## 2023-04-30 MED ORDER — ALBUTEROL SULFATE HFA 108 (90 BASE) MCG/ACT IN AERS
1.0000 | INHALATION_SPRAY | Freq: Four times a day (QID) | RESPIRATORY_TRACT | 0 refills | Status: DC | PRN
Start: 2023-04-30 — End: 2023-11-06
  Filled 2023-04-30: qty 18, 25d supply, fill #0

## 2023-04-30 MED ORDER — CLOBETASOL PROPIONATE 0.05 % EX CREA
1.0000 | TOPICAL_CREAM | Freq: Two times a day (BID) | CUTANEOUS | 0 refills | Status: DC
  Filled 2023-04-30: qty 30, 30d supply, fill #0

## 2023-04-30 MED ORDER — METOPROLOL TARTRATE 50 MG PO TABS
50.0000 mg | ORAL_TABLET | Freq: Two times a day (BID) | ORAL | 2 refills | Status: DC
Start: 2023-04-30 — End: 2023-09-29
  Filled 2023-05-27: qty 60, 30d supply, fill #0
  Filled 2023-07-05: qty 60, 30d supply, fill #1
  Filled 2023-08-16: qty 60, 30d supply, fill #2

## 2023-04-30 MED ORDER — NICOTINE POLACRILEX 2 MG MT LOZG
LOZENGE | OROMUCOSAL | 0 refills | Status: DC
  Filled 2023-04-30: qty 100, fill #0

## 2023-04-30 MED ORDER — METFORMIN HCL 500 MG PO TABS
1000.0000 mg | ORAL_TABLET | Freq: Two times a day (BID) | ORAL | 1 refills | Status: DC
Start: 2023-04-30 — End: 2023-10-27
  Filled 2023-04-30: qty 360, 90d supply, fill #0

## 2023-04-30 NOTE — Progress Notes (Addendum)
Established Patient Office Visit  Subjective   Patient ID: Sydney Williams, female    DOB: 1966-03-08  Age: 57 y.o. MRN: [id number]  Chief Complaint  Patient presents with   Gynecologic Exam    Pap. Med refillls.   Diabetes   Hypertension      04/2022 Sydney Williams is a 57 year old female who presents today for follow up. Her medical history includes hypertension, asthma, type 2 diabetes, and tobacco use.  Last seen in March, she had lowered her Hemoglobin A1C significantly (12.2% to 6.5%). She was taken off insulin and now is using Metformin and Trulicity daily. Today she is even lower with an Hemoglobin A1C of 5.7% She has been keeping up with her diet, and reports only a few cheat meals.  When she was seen in March we ordered a mammogram screen which showed some questionable findings in the right breast, the left was clear. She then had a follow up right breast ultrasound study (04/11/2022) and was found to have a 0.3 x 0.1 x 0.4 cm benign cyst.   At that same March visit,  Sydney Williams was experiencing some ear itchiness and was found to have a fungal excoriation on the right outer ear. Clotrimazole cream was sent to pharmacy but she was unable to obtain because of stocking issues.  She now is also reporting itchy bumps just below her thumbs on the dorsal aspect of her hands bilaterally. These bumps come and go and do not pop up elsewhere on her body. She believes this may be due to how long she has her latex gloves on at work.   Blood pressure today is 127/85. She is taking amlodipine, metoprolol and chlorthalidone daily.   Denies any recent shortness of breath. She is using fluticasone-umeclinid-vilant inhaler every morning. States she has not had to use the albuterol in a long time.  She is currently still smoking, about 3-5 cigarettes a day. At our last appointment I tried to send in nicotine lozenges however she does not have insurance and was not covered for these. She is still interested in  trying to obtain them.   She was also since seen in the Emergency department twice for a right broken toe (01/2022) and left foot calcaneous spurs (04/2022). Reports her feet frequently give her issues on and off, including pain and swelling.   12/26/22  Follow-up for diabetes and hypertension.  On arrival blood pressure is good.  He has advised.  Patient states sinus congestion. She has had gout in the past but it is improved.  She has Medicaid.  She is still smoking 4 to 5 cigarettes daily.  Blood sugars been well-controlled on arrival A1c is 5.9.  She does need an eye exam. Patient is concerned about a lump under her right jaw.  04/30/23 Patient returns in follow-up for her hypertension and diabetes.  On arrival blood pressure is good 115/65.  Patient had a flare of her gout and is only been on colchicine as needed allopurinol had been stopped.  Smoking is down to 1 to 2 cigarettes daily.  She needs refills of medication sent to her [facility].  She is having a rash on her right forearm and needs a refill on her steroid cream.  Patient maintains metformin and Ozempic with good glycemic control.  Patient has an eye exam upcoming in September.      Patient Active Problem List   Diagnosis Date Noted   Acute gout involving toe of right  foot 07/03/2022   Foot callus 05/16/2022   Breast cyst, right 05/16/2022   Eczema 05/16/2022   Aortic atherosclerosis (HCC) 11/15/2021   Otomycosis of right ear 07/04/2021   Degenerative disc disease, lumbar    History of lumbar spinal fusion 10/24/2020   Seasonal and perennial allergic rhinoconjunctivitis 09/18/2020   Carpal tunnel syndrome on right    Seronegative rheumatoid arthritis (HCC) 08/04/2020   Allergic conjunctivitis of both eyes 07/19/2020   Lumbar radiculopathy, chronic 04/03/2020   Elevated blood uric acid level 04/03/2020   Family history of cervical cancer 02/02/2018   Family history of breast cancer 02/02/2018   Vitamin D deficiency  02/02/2018   Depression 06/19/2017   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 01/08/2017   Snoring 09/11/2016   History of migraine 09/11/2016   Frequent headaches 09/11/2016   Gastroesophageal reflux disease without esophagitis 06/01/2015   Other allergic rhinitis 06/01/2015   Tobacco user 11/30/2014   Essential hypertension 11/30/2014   Hyperlipidemia 11/30/2014   Asthma, moderate persistent 11/30/2014   Obesity 07/02/2012   Type 2 diabetes mellitus without complication, without long-term current use of insulin (HCC) 07/02/2012   Past Medical History:  Diagnosis Date   Asthma    2015 hospitaliation for asthma   Diabetes mellitus    Type II   Eczema 05/16/2022   Family history of breast cancer    Fibroids 2010   Genital herpes    Genital herpes 11/30/2014   GERD (gastroesophageal reflux disease)    Hyperlipidemia    Hypertension    Impaired fasting blood sugar    Migraines    Obesity    Right carpal tunnel syndrome    Smoker    Past Surgical History:  Procedure Laterality Date   BACK SURGERY  10/24/2020   CARDIAC CATHETERIZATION     CARPAL TUNNEL RELEASE Right 08/30/2020   Procedure: RIGHT CARPAL TUNNEL RELEASE;  Surgeon: Tarry Kos, MD;  Location: [facility];  Service: Orthopedics;  Laterality: Right;   COLONOSCOPY  03/13/2016   Dr. Leone Payor, normal, repeat 2027   CYST EXCISION     left neck/postauricular region, benign   DILATION AND CURETTAGE OF UTERUS     LAPAROSCOPIC ABDOMINAL EXPLORATION     removal of ectopic preg   LEFT HEART CATH AND CORONARY ANGIOGRAPHY N/A 06/23/2017   Procedure: LEFT HEART CATH AND CORONARY ANGIOGRAPHY;  Surgeon: Yvonne Kendall, MD;  Location: [facility];  Service: Cardiovascular;  Laterality: N/A;   TUBAL LIGATION     Social History   Tobacco Use   Smoking status: Every Day    Packs/day: 0.25    Years: 14.00    Additional pack years: 0.00    Total pack years: 3.50    Types: Cigarettes   Smokeless  tobacco: Never  Vaping Use   Vaping Use: Never used  Substance Use Topics   Alcohol use: Yes    Comment: occ   Drug use: No   Family History  Problem Relation Age of Onset   Diabetes Mother    Hypertension Mother    Aneurysm Mother    Stroke Mother    Cancer Mother        cervical cancer   Breast cancer Maternal Aunt    Cancer Maternal Aunt        breast   Cancer Cousin        breast/breast   Lupus Cousin    Heart disease Neg Hx    Colon cancer Neg Hx  Allergic rhinitis Neg Hx    Angioedema Neg Hx    Asthma Neg Hx    Atopy Neg Hx    Eczema Neg Hx    Immunodeficiency Neg Hx    Urticaria Neg Hx    Allergies  Allergen Reactions   Lisinopril Swelling   Potassium Chloride Shortness Of Breath and Swelling    Tolerates IV KCl, reaction only to PO product   Pneumococcal Vaccines Swelling and Other (See Comments)    Reaction:  Swelling at injection site   Vicodin [Hydrocodone-Acetaminophen] Itching and Rash      Review of Systems  Constitutional: Negative.   HENT:  Negative for congestion, sinus pain and sore throat.        Right ear itching, swelling under right jaw  Eyes: Negative.   Respiratory: Negative.    Cardiovascular: Negative.   Gastrointestinal: Negative.   Genitourinary: Negative.   Musculoskeletal:        Intermittent bilateral foot pain and swelling  Skin:  Positive for itching and rash.       On right earlobe  Neurological: Negative.   Endo/Heme/Allergies: Negative.   Psychiatric/Behavioral: Negative.        Objective:     BP 115/65   Pulse 79   Temp 98.9 F (37.2 C) (Oral)   Ht 5\' 11"  (1.803 m)   Wt 244 lb (110.7 kg)   LMP 06/29/2015 (Exact Date)   SpO2 96%   BMI 34.03 kg/m     Physical Exam Constitutional:      Appearance: Normal appearance. She is obese.  HENT:     Head: Normocephalic.     Right Ear: Tympanic membrane, ear canal and external ear normal.     Left Ear: Tympanic membrane and ear canal normal.     Ears:      Comments: Left outer ear, about 2 cm area of excoriation     Nose: No congestion or rhinorrhea.     Mouth/Throat:     Mouth: Mucous membranes are moist.     Pharynx: Oropharynx is clear.     Comments: Swelling under the right jaw all resolved Eyes:     Conjunctiva/sclera: Conjunctivae normal.     Comments: Fungal changes right earlobe  Cardiovascular:     Rate and Rhythm: Normal rate and regular rhythm.     Pulses: Normal pulses.     Heart sounds: Normal heart sounds.  Pulmonary:     Effort: Pulmonary effort is normal.     Breath sounds: Normal breath sounds.  Abdominal:     General: Bowel sounds are normal.     Palpations: Abdomen is soft.  Musculoskeletal:     Comments: Left plantar callus foot exam otherwise normal  Skin:    Comments: Bilateral bases of thumb (dorsal) with multiple erythematous small papules, no drainage/ulceration   Neurological:     Mental Status: She is alert and oriented to person, place, and time.  Psychiatric:        Mood and Affect: Mood normal.        Behavior: Behavior normal.      Results for orders placed or performed in visit on 04/30/23  POCT glucose (manual entry)  Result Value Ref Range   POC Glucose 98 70 - 99 mg/dl       The 16-XWRU ASCVD risk score (Arnett DK, et al., 2019) is: 15.8%    Assessment & Plan:   Problem List Items Addressed This Visit  Cardiovascular and Mediastinum   Essential hypertension    Blood pressure under good control continue with amlodipine and metoprolol and chlorthalidone      Relevant Medications   amLODipine (NORVASC) 10 MG tablet   atorvastatin (LIPITOR) 40 MG tablet   chlorthalidone (HYGROTON) 25 MG tablet   metoprolol tartrate (LOPRESSOR) 50 MG tablet   Aortic atherosclerosis (HCC)    Continue with statin therapy      Relevant Medications   amLODipine (NORVASC) 10 MG tablet   atorvastatin (LIPITOR) 40 MG tablet   chlorthalidone (HYGROTON) 25 MG tablet   metoprolol tartrate  (LOPRESSOR) 50 MG tablet     Respiratory   Asthma, moderate persistent   Relevant Medications   albuterol (VENTOLIN HFA) 108 (90 Base) MCG/ACT inhaler   Fluticasone-Umeclidin-Vilant 200-62.5-25 MCG/ACT AEPB     Endocrine   Type 2 diabetes mellitus without complication, without long-term current use of insulin (HCC)    Approaching goal continue with Ozempic and metformin      Relevant Medications   Semaglutide, 1 MG/DOSE, 4 MG/3ML SOPN   atorvastatin (LIPITOR) 40 MG tablet   metFORMIN (GLUCOPHAGE) 500 MG tablet   Other Relevant Orders   POCT glucose (manual entry) (Completed)     Musculoskeletal and Integument   Acute gout involving toe of right foot    Will resume allopurinol and colchicine      Relevant Medications   allopurinol (ZYLOPRIM) 100 MG tablet   colchicine 0.6 MG tablet     Other   Tobacco user       Current smoking consumption amount: 1-3 cigarette a day  Dicsussion on advise to quit smoking and smoking impacts: Cardiovascular impact  Patient's willingness to quit: Wants to quit  Methods to quit smoking discussed: Behavioral modification and nicotine replacement  Medication management of smoking session drugs discussed: Nicotine replacement  Resources provided:  AVS   Setting quit date not established  Follow-up arranged 2 months   Time spent counseling the patient: 5 minutes        Hyperlipidemia    Continue with statin      Relevant Medications   amLODipine (NORVASC) 10 MG tablet   atorvastatin (LIPITOR) 40 MG tablet   chlorthalidone (HYGROTON) 25 MG tablet   metoprolol tartrate (LOPRESSOR) 50 MG tablet   Other Visit Diagnoses     Vaginal discharge    -  Primary   Relevant Orders   Cervicovaginal ancillary only   Cervical cancer screening       Relevant Orders   Cytology - PAP([place])   Gout involving toe of right foot, unspecified cause, unspecified chronicity       Relevant Medications   allopurinol (ZYLOPRIM) 100 MG  tablet   colchicine 0.6 MG tablet      Return in about 6 months (around 10/31/2023) for diabetes, htn.    Shan Levans, MD

## 2023-04-30 NOTE — Assessment & Plan Note (Signed)
    Current smoking consumption amount: 1-3 cigarette a day  Dicsussion on advise to quit smoking and smoking impacts: Cardiovascular impact  Patient's willingness to quit: Wants to quit  Methods to quit smoking discussed: Behavioral modification and nicotine replacement  Medication management of smoking session drugs discussed: Nicotine replacement  Resources provided:  AVS   Setting quit date not established  Follow-up arranged 2 months   Time spent counseling the patient: 5 minutes   

## 2023-04-30 NOTE — Assessment & Plan Note (Signed)
Blood pressure under good control continue with amlodipine and metoprolol and chlorthalidone

## 2023-04-30 NOTE — Assessment & Plan Note (Signed)
Continue with statin therapy.  ?

## 2023-04-30 NOTE — Assessment & Plan Note (Signed)
Will resume allopurinol and colchicine

## 2023-04-30 NOTE — Patient Instructions (Signed)
Medications refilled  Stop smoking with nicotine patch  Return 6 months

## 2023-04-30 NOTE — Progress Notes (Signed)
Patient ID: Sydney Williams, female   DOB: April 11, 1966, 57 y.o.   MRN: [id number]   S:  pap.  She has had an abnormal pap.  Some vaginal discharge.  No periods in years.  No vaginal bleeding.  O:  NAD. External genitalia WNL.  Speculum reveals mutiparous cervix.  Pap and swab taken.  Bimanual unremarkable.  Pap and swab sent

## 2023-04-30 NOTE — Assessment & Plan Note (Signed)
Approaching goal continue with Ozempic and metformin

## 2023-04-30 NOTE — Assessment & Plan Note (Signed)
Continue with statin

## 2023-05-02 ENCOUNTER — Other Ambulatory Visit: Payer: Self-pay

## 2023-05-02 LAB — CERVICOVAGINAL ANCILLARY ONLY
Bacterial Vaginitis (gardnerella): NEGATIVE
Candida Glabrata: POSITIVE — AB
Candida Vaginitis: NEGATIVE
Chlamydia: NEGATIVE
Comment: NEGATIVE
Comment: NEGATIVE
Comment: NEGATIVE
Comment: NEGATIVE
Comment: NEGATIVE
Comment: NORMAL
Neisseria Gonorrhea: NEGATIVE
Trichomonas: NEGATIVE

## 2023-05-05 ENCOUNTER — Other Ambulatory Visit: Payer: Self-pay | Admitting: Physician Assistant

## 2023-05-05 ENCOUNTER — Other Ambulatory Visit: Payer: Self-pay

## 2023-05-05 LAB — CYTOLOGY - PAP
Diagnosis: NEGATIVE
Diagnosis: REACTIVE

## 2023-05-05 MED ORDER — FLUCONAZOLE 150 MG PO TABS
150.0000 mg | ORAL_TABLET | Freq: Once | ORAL | 1 refills | Status: AC
  Filled 2023-05-05: qty 1, 7d supply, fill #0

## 2023-05-07 ENCOUNTER — Other Ambulatory Visit: Payer: Self-pay

## 2023-05-27 ENCOUNTER — Ambulatory Visit (HOSPITAL_COMMUNITY): Payer: Self-pay

## 2023-05-27 ENCOUNTER — Other Ambulatory Visit: Payer: Self-pay

## 2023-05-27 ENCOUNTER — Encounter: Payer: Self-pay | Admitting: Physician Assistant

## 2023-05-27 ENCOUNTER — Ambulatory Visit: Payer: 59 | Admitting: Physician Assistant

## 2023-05-27 ENCOUNTER — Ambulatory Visit: Payer: Self-pay

## 2023-05-27 VITALS — BP 141/89 | HR 57 | Temp 97.3°F | Ht 71.0 in | Wt 244.0 lb

## 2023-05-27 DIAGNOSIS — H65192 Other acute nonsuppurative otitis media, left ear: Secondary | ICD-10-CM

## 2023-05-27 DIAGNOSIS — I10 Essential (primary) hypertension: Secondary | ICD-10-CM

## 2023-05-27 MED ORDER — AMOXICILLIN-POT CLAVULANATE 875-125 MG PO TABS
1.0000 | ORAL_TABLET | Freq: Two times a day (BID) | ORAL | 0 refills | Status: AC
Start: 2023-05-27 — End: 2023-06-03
  Filled 2023-05-27: qty 14, 7d supply, fill #0

## 2023-05-27 NOTE — Progress Notes (Signed)
Established Patient Office Visit  Subjective   Patient ID: Sydney Williams, female    DOB: 31-Jan-1966  Age: 57 y.o. MRN: [id number]  Chief Complaint  Patient presents with   Ear Pain    Left ear pain, pain radiates from head to neck.     States that she has been experiencing pain in her left ear.  States this has been ongoing for 2 days, but worse today.  States that it hurts when she eats, states that she noticed swelling around her ear, and states the pain radiates above her left eye and down her left side of her neck.  Denies drainage.  States that she has tried Tylenol and ibuprofen with very little relief.  Denies sick contacts, denies any other upper respiratory.  States that she does check her blood pressure at home on a regular basis and states that her readings are generally within normal limits.    Past Medical History:  Diagnosis Date   Asthma    2015 hospitaliation for asthma   Diabetes mellitus    Type II   Eczema 05/16/2022   Family history of breast cancer    Fibroids 2010   Genital herpes    Genital herpes 11/30/2014   GERD (gastroesophageal reflux disease)    Hyperlipidemia    Hypertension    Impaired fasting blood sugar    Migraines    Obesity    Right carpal tunnel syndrome    Smoker    Social History   Socioeconomic History   Marital status: Legally Separated    Spouse name: Not on file   Number of children: Not on file   Years of education: Not on file   Highest education level: Not on file  Occupational History   Not on file  Tobacco Use   Smoking status: Every Day    Current packs/day: 0.25    Average packs/day: 0.3 packs/day for 14.0 years (3.5 ttl pk-yrs)    Types: Cigarettes   Smokeless tobacco: Never  Vaping Use   Vaping status: Never Used  Substance and Sexual Activity   Alcohol use: Yes    Comment: occ   Drug use: No   Sexual activity: Yes    Birth control/protection: Surgical  Other Topics Concern   Not on file  Social  History Narrative   Married, and her granddaughter lives with her, works as Lawyer at [employer], exercise - activity at work, walking.  12/2016   Social Determinants of Health   Financial Resource Strain: Not on file  Food Insecurity: No Food Insecurity (04/11/2022)   Hunger Vital Sign    Worried About Running Out of Food in the Last Year: Never true    Ran Out of Food in the Last Year: Never true  Transportation Needs: No Transportation Needs (04/11/2022)   PRAPARE - Administrator, Civil Service (Medical): No    Lack of Transportation (Non-Medical): No  Physical Activity: Not on file  Stress: Not on file  Social Connections: Not on file  Intimate Partner Violence: Not on file   Family History  Problem Relation Age of Onset   Diabetes Mother    Hypertension Mother    Aneurysm Mother    Stroke Mother    Cancer Mother        cervical cancer   Breast cancer Maternal Aunt    Cancer Maternal Aunt        breast   Cancer Cousin  breast/breast   Lupus Cousin    Heart disease Neg Hx    Colon cancer Neg Hx    Allergic rhinitis Neg Hx    Angioedema Neg Hx    Asthma Neg Hx    Atopy Neg Hx    Eczema Neg Hx    Immunodeficiency Neg Hx    Urticaria Neg Hx    Allergies  Allergen Reactions   Lisinopril Swelling   Potassium Chloride Shortness Of Breath and Swelling    Tolerates IV KCl, reaction only to PO product   Pneumococcal Vaccines Swelling and Other (See Comments)    Reaction:  Swelling at injection site   Vicodin [Hydrocodone-Acetaminophen] Itching and Rash    Review of Systems  Constitutional:  Negative for chills and fever.  HENT:  Positive for ear pain. Negative for hearing loss and sinus pain.   Eyes: Negative.   Respiratory:  Negative for cough and shortness of breath.   Cardiovascular:  Negative for chest pain.  Gastrointestinal: Negative.   Genitourinary: Negative.   Musculoskeletal: Negative.   Skin: Negative.   Neurological: Negative.    Endo/Heme/Allergies: Negative.   Psychiatric/Behavioral: Negative.        Objective:     BP (!) 141/89 (BP Location: Left Arm, Patient Position: Sitting, Cuff Size: Large)   Pulse (!) 57   Temp (!) 97.3 F (36.3 C)   Ht 5\' 11"  (1.803 m)   Wt 244 lb (110.7 kg)   LMP 06/29/2015 (Exact Date)   SpO2 97%   BMI 34.03 kg/m  BP Readings from Last 3 Encounters:  05/27/23 (!) 141/89  04/30/23 115/65  02/22/23 138/86   Wt Readings from Last 3 Encounters:  05/27/23 244 lb (110.7 kg)  04/30/23 244 lb (110.7 kg)  02/22/23 242 lb 6.4 oz (110 kg)      Physical Exam Vitals and nursing note reviewed.  Constitutional:      Appearance: Normal appearance.  HENT:     Head: Normocephalic.     Jaw: There is normal jaw occlusion.     Salivary Glands: Right salivary gland is not diffusely enlarged or tender. Left salivary gland is not diffusely enlarged or tender.     Right Ear: Tympanic membrane, ear canal and external ear normal.     Left Ear: Swelling and tenderness present. Tympanic membrane is injected, erythematous and bulging.     Nose: Nose normal.     Mouth/Throat:     Mouth: Mucous membranes are moist.     Pharynx: Oropharynx is clear.  Eyes:     Extraocular Movements: Extraocular movements intact.     Conjunctiva/sclera: Conjunctivae normal.     Pupils: Pupils are equal, round, and reactive to light.  Cardiovascular:     Rate and Rhythm: Normal rate and regular rhythm.     Pulses: Normal pulses.     Heart sounds: Normal heart sounds.  Pulmonary:     Effort: Pulmonary effort is normal.     Breath sounds: Normal breath sounds.  Musculoskeletal:        General: Normal range of motion.     Cervical back: Normal range of motion and neck supple.  Lymphadenopathy:     Head:     Left side of head: Preauricular and posterior auricular adenopathy present.  Skin:    General: Skin is warm and dry.  Neurological:     General: No focal deficit present.     Mental Status: She is  alert and oriented to person, place, and  time.  Psychiatric:        Mood and Affect: Mood normal.        Behavior: Behavior normal.        Thought Content: Thought content normal.        Judgment: Judgment normal.        Assessment & Plan:   Problem List Items Addressed This Visit   None Visit Diagnoses     Other non-recurrent acute nonsuppurative otitis media of left ear    -  Primary   Relevant Medications   amoxicillin-clavulanate (AUGMENTIN) 875-125 MG tablet   Elevated blood pressure reading in office with diagnosis of hypertension          1. Other non-recurrent acute nonsuppurative otitis media of left ear Trial Augmentin.  Patient education given on supportive care.  Red flags given for prompt reevaluation - amoxicillin-clavulanate (AUGMENTIN) 875-125 MG tablet; Take 1 tablet by mouth 2 (two) times daily for 7 days.  Dispense: 14 tablet; Refill: 0  2. Elevated blood pressure reading in office with diagnosis of hypertension Patient encouraged to continue checking blood pressure at home, keeping a written log and having available for all office visits.   I have reviewed the patient's medical history (PMH, PSH, Social History, Family History, Medications, and allergies) , and have been updated if relevant. I spent 30 minutes reviewing chart and  face to face time with patient.    Return if symptoms worsen or fail to improve.    Kasandra Knudsen Mayers, PA-C

## 2023-05-27 NOTE — Telephone Encounter (Signed)
Chief Complaint: Ear ache Symptoms: Left ear pain, headache and stiff neck Frequency: constant onset 2 days ago Pertinent Negatives: Patient denies nausea, vomiting, fever  Disposition: [] ED /[x] Urgent Care (no appt availability in office) / [] Appointment(In office/virtual)/ []  [facility]/ [] Home Care/ [] Refused Recommended Disposition /[] [facility]/ []  Follow-up with PCP Additional Notes: Patient states that her left ear began to hurt 2 days ago. Patient denies swimming, water in ear, or travel. Patient reports pain 8/10 at this time and not relief by taking tylenol or ibuprofen. Patient states when she tries to eat it is very painful and it feels like water is coming out of the left ear. Patient also reports a stiff neck and headache. Patient advised she needs to be evaluated. Patient is agreeable. No appointments available in the office. Patient has been scheduled today at [facility] at 1800.  Reason for Disposition  [1] SEVERE pain AND [2] not improved 2 hours after taking analgesic medication (e.g., ibuprofen or acetaminophen)  Answer Assessment - Initial Assessment Questions 1. LOCATION: "Which ear is involved?"     Left ear  2. ONSET: "When did the ear start hurting"      2 days ago  3. SEVERITY: "How bad is the pain?"  (Scale 1-10; mild, moderate or severe)   - MILD (1-3): doesn't interfere with normal activities    - MODERATE (4-7): interferes with normal activities or awakens from sleep    - SEVERE (8-10): excruciating pain, unable to do any normal activities      8/10 4. URI SYMPTOMS: "Do you have a runny nose or cough?"     No 5. FEVER: "Do you have a fever?" If Yes, ask: "What is your temperature, how was it measured, and when did it start?"     No I haven't checked it and I don't feel warm  6. CAUSE: "Have you been swimming recently?", "How often do you use Q-TIPS?", "Have you had any recent air travel or scuba diving?"     No 7. OTHER SYMPTOMS: "Do  you have any other symptoms?" (e.g., headache, stiff neck, dizziness, vomiting, runny nose, decreased hearing)     Stiff neck, headache  Protocols used: Earache-A-AH

## 2023-05-27 NOTE — Patient Instructions (Signed)
You are going to take Augmentin twice a day for 7 days.  You can continue to use Tylenol to help you with the discomfort.  I hope that you feel better soon, please let us know if there is anything else we can do for you  Roney Jaffe, PA-C Physician Assistant [facility] [email or web address]    Otitis Media, Adult  Otitis media occurs when there is inflammation and fluid in the middle ear with signs and symptoms of an acute infection. The middle ear is a part of the ear that contains bones for hearing as well as air that helps send sounds to the brain. When infected fluid builds up in this space, it causes pressure and can lead to an ear infection. The eustachian tube connects the middle ear to the back of the nose (nasopharynx) and normally allows air into the middle ear. If the eustachian tube becomes blocked, fluid can build up and become infected. What are the causes? This condition is caused by a blockage in the eustachian tube. This can be caused by mucus or by swelling of the tube. Problems that can cause a blockage include: A cold or other upper respiratory infection. Allergies. An irritant, such as tobacco smoke. Enlarged adenoids. The adenoids are areas of soft tissue located high in the back of the throat, behind the nose and the roof of the mouth. They are part of the body's defense system (immune system). A mass in the nasopharynx. Damage to the ear caused by pressure changes (barotrauma). What increases the risk? You are more likely to develop this condition if you: Smoke or are exposed to tobacco smoke. Have an opening in the roof of your mouth (cleft palate). Have gastroesophageal reflux. Have an immune system disorder. What are the signs or symptoms? Symptoms of this condition include: Ear pain. Fever. Decreased hearing. Tiredness (lethargy). Fluid leaking from the ear, if the eardrum is ruptured or has  burst. Ringing in the ear. How is this diagnosed?  This condition is diagnosed with a physical exam. During the exam, your health care provider will use an instrument called an otoscope to look in your ear and check for redness, swelling, and fluid. He or she will also ask about your symptoms. Your health care provider may also order tests, such as: A pneumatic otoscopy. This is a test to check the movement of the eardrum. It is done by squeezing a small amount of air into the ear. A tympanogram. This is a test that shows how well the eardrum moves in response to air pressure in the ear canal. It provides a graph for your health care provider to review. How is this treated? This condition can go away on its own within 3-5 days. But if the condition is caused by a bacterial infection and does not go away on its own, or if it keeps coming back, your health care provider may: Prescribe antibiotic medicine to treat the infection. Prescribe or recommend medicines to control pain. Follow these instructions at home: Take over-the-counter and prescription medicines only as told by your health care provider. If you were prescribed an antibiotic medicine, take it as told by your health care provider. Do not stop taking the antibiotic even if you start to feel better. Keep all follow-up visits. This is important. Contact a health care provider if: You have bleeding from your nose. There is a lump on your neck. You are not feeling better in 5 days. You feel worse instead  of better. Get help right away if: You have severe pain that is not controlled with medicine. You have swelling, redness, or pain around your ear. You have stiffness in your neck. A part of your face is not moving (paralyzed). The bone behind your ear (mastoid bone) is tender when you touch it. You develop a severe headache. Summary Otitis media is redness, soreness, and swelling of the middle ear, usually resulting in pain and  decreased hearing. This condition can go away on its own within 3-5 days. If the problem does not go away in 3-5 days, your health care provider may give you medicines to treat the infection. If you were prescribed an antibiotic medicine, take it as told by your health care provider. Follow all instructions that were given to you by your health care provider. This information is not intended to replace advice given to you by your health care provider. Make sure you discuss any questions you have with your health care provider. Document Revised: 01/22/2021 Document Reviewed: 01/22/2021 Elsevier Patient Education  2024 [employer].

## 2023-05-28 NOTE — Telephone Encounter (Signed)
Noted  

## 2023-05-30 ENCOUNTER — Other Ambulatory Visit: Payer: Self-pay

## 2023-06-06 ENCOUNTER — Other Ambulatory Visit: Payer: Self-pay

## 2023-06-09 ENCOUNTER — Other Ambulatory Visit: Payer: Self-pay

## 2023-06-16 ENCOUNTER — Other Ambulatory Visit: Payer: Self-pay

## 2023-06-19 ENCOUNTER — Other Ambulatory Visit: Payer: Self-pay

## 2023-06-21 ENCOUNTER — Emergency Department (HOSPITAL_COMMUNITY)
Admission: EM | Admit: 2023-06-21 | Discharge: 2023-06-21 | Disposition: A | Discharge status: Home / Self Care | Payer: 59 | Attending: Emergency Medicine | Admitting: Emergency Medicine

## 2023-06-21 ENCOUNTER — Encounter (HOSPITAL_COMMUNITY): Payer: Self-pay | Admitting: Emergency Medicine

## 2023-06-21 ENCOUNTER — Emergency Department (HOSPITAL_COMMUNITY): Payer: 59

## 2023-06-21 ENCOUNTER — Other Ambulatory Visit: Payer: Self-pay

## 2023-06-21 DIAGNOSIS — X58XXXA Exposure to other specified factors, initial encounter: Secondary | ICD-10-CM | POA: Diagnosis not present

## 2023-06-21 DIAGNOSIS — Z23 Encounter for immunization: Secondary | ICD-10-CM | POA: Diagnosis not present

## 2023-06-21 DIAGNOSIS — Z7984 Long term (current) use of oral hypoglycemic drugs: Secondary | ICD-10-CM | POA: Diagnosis not present

## 2023-06-21 DIAGNOSIS — E119 Type 2 diabetes mellitus without complications: Secondary | ICD-10-CM | POA: Insufficient documentation

## 2023-06-21 DIAGNOSIS — Z794 Long term (current) use of insulin: Secondary | ICD-10-CM | POA: Insufficient documentation

## 2023-06-21 DIAGNOSIS — S91109A Unspecified open wound of unspecified toe(s) without damage to nail, initial encounter: Secondary | ICD-10-CM

## 2023-06-21 DIAGNOSIS — M19071 Primary osteoarthritis, right ankle and foot: Secondary | ICD-10-CM | POA: Diagnosis not present

## 2023-06-21 DIAGNOSIS — Z7982 Long term (current) use of aspirin: Secondary | ICD-10-CM | POA: Diagnosis not present

## 2023-06-21 DIAGNOSIS — S91201A Unspecified open wound of right great toe with damage to nail, initial encounter: Secondary | ICD-10-CM | POA: Insufficient documentation

## 2023-06-21 DIAGNOSIS — S99921A Unspecified injury of right foot, initial encounter: Secondary | ICD-10-CM | POA: Diagnosis present

## 2023-06-21 DIAGNOSIS — S90111A Contusion of right great toe without damage to nail, initial encounter: Secondary | ICD-10-CM | POA: Diagnosis not present

## 2023-06-21 MED ORDER — AMOXICILLIN-POT CLAVULANATE 875-125 MG PO TABS
1.0000 | ORAL_TABLET | Freq: Two times a day (BID) | ORAL | 0 refills | Status: AC
  Filled 2023-06-21: qty 14, 7d supply, fill #0

## 2023-06-21 MED ORDER — TRANEXAMIC ACID FOR EPISTAXIS
500.0000 mg | Freq: Once | TOPICAL | Status: AC
  Administered 2023-06-21: 500 mg via TOPICAL
  Filled 2023-06-21: qty 10

## 2023-06-21 MED ORDER — AMOXICILLIN-POT CLAVULANATE 875-125 MG PO TABS
1.0000 | ORAL_TABLET | Freq: Once | ORAL | Status: AC
  Administered 2023-06-21: 1 via ORAL
  Filled 2023-06-21: qty 1

## 2023-06-21 MED ORDER — TETANUS-DIPHTH-ACELL PERTUSSIS 5-2.5-18.5 LF-MCG/0.5 IM SUSY
0.5000 mL | PREFILLED_SYRINGE | Freq: Once | INTRAMUSCULAR | Status: AC
  Administered 2023-06-21: 0.5 mL via INTRAMUSCULAR
  Filled 2023-06-21: qty 0.5

## 2023-06-21 NOTE — Discharge Instructions (Signed)
Take the antibiotics as prescribed.  Keep wound clean and dry for 24 hours.  Change dressing starting Monday morning. Return to the ED with new or worsening symptoms

## 2023-06-21 NOTE — ED Provider Notes (Addendum)
[place] EMERGENCY DEPARTMENT AT [facility] Provider Note   CSN: [id number] Arrival date & time: 06/21/23  0320     History  Chief Complaint  Patient presents with   Toe Injury    Sydney Williams is a 57 y.o. female.  Diabetic on metformin presenting with right great toe injury.  She was trying to remove dead skin from her foot with a metal scraping device.  She "went too deep" on the medial side of her right great toe and is having bleeding that is difficult to control.  States this has been bleeding since 6 PM.  She takes baby aspirin but no other anticoagulation.  She is concerned about the wound because she is diabetic.  No focal weakness, numbness or tingling.  Change the dressing 3 times at home due to persistent bleeding.  Tetanus needs updating.  The history is provided by the patient.       Home Medications Prior to Admission medications   Medication Sig Start Date End Date Taking? Authorizing Provider  acetaminophen (TYLENOL) 500 MG tablet Take 1,000 mg by mouth every 6 (six) hours as needed for moderate pain.    [provider]  acyclovir (ZOVIRAX) 200 MG capsule Take 1 capsule (200 mg total) by mouth 2 (two) times daily. 04/30/23   Storm Frisk, MD  albuterol (VENTOLIN HFA) 108 (90 Base) MCG/ACT inhaler Inhale 1-2 puffs into the lungs every 6 (six) hours as needed for wheezing or shortness of breath. 04/30/23   Storm Frisk, MD  allopurinol (ZYLOPRIM) 100 MG tablet Take 1 tablet (100 mg total) by mouth daily. 04/30/23   Storm Frisk, MD  amLODipine (NORVASC) 10 MG tablet Take 1 tablet (10 mg total) by mouth daily to lower blood pressure. 04/30/23   Storm Frisk, MD  ascorbic acid (VITAMIN C) 500 MG tablet Take 500 mg by mouth daily.    [provider]  aspirin (EQ ASPIRIN ADULT LOW DOSE) 81 MG EC tablet Take 1 tablet (81 mg total) by mouth daily. Swallow whole. 02/03/18   Tysinger, Kermit Balo, PA-C  atorvastatin (LIPITOR) 40 MG tablet  Take 1 tablet (40 mg total) by mouth daily. 04/30/23   Storm Frisk, MD  azelastine (ASTELIN) 0.1 % nasal spray Place 2 sprays into both nostrils 2 (two) times daily. Use in each nostril as directed Patient not taking: Reported on 04/30/2023 12/26/22   Storm Frisk, MD  chlorthalidone (HYGROTON) 25 MG tablet Take 0.5 tablets (12.5 mg total) by mouth daily. 04/30/23   Storm Frisk, MD  clobetasol cream (TEMOVATE) 0.05 % Apply 1 Application topically 2 (two) times daily. 04/30/23   Storm Frisk, MD  colchicine 0.6 MG tablet Take 1 tablet (0.6 mg total) by mouth daily as needed (gout flare). 04/30/23   Storm Frisk, MD  ferrous sulfate 325 (65 FE) MG tablet Take 325 mg by mouth daily with breakfast.    [provider]  fluticasone (FLONASE) 50 MCG/ACT nasal spray Place 2 sprays into both nostrils daily. 12/26/22   Storm Frisk, MD  Fluticasone-Umeclidin-Vilant 200-62.5-25 MCG/ACT AEPB Inhale 1 puff into the lungs daily. Rinse mouth after each use. 04/30/23   Storm Frisk, MD  furosemide (LASIX) 20 MG tablet Take 1 tablet (20 mg total) by mouth daily as needed for edema. Patient not taking: Reported on 04/30/2023 12/26/22   Storm Frisk, MD  Insulin Pen Needle (B-D ULTRAFINE III SHORT PEN) 31G X 8  MM MISC Use as needed. Patient not taking: Reported on 04/30/2023 12/26/22   Storm Frisk, MD  metFORMIN (GLUCOPHAGE) 500 MG tablet Take 2 tablets (1,000 mg total) by mouth 2 (two) times daily with a meal. 04/30/23   Storm Frisk, MD  metoprolol tartrate (LOPRESSOR) 50 MG tablet Take 1 tablet (50 mg total) by mouth 2 (two) times daily. 04/30/23   Storm Frisk, MD  nicotine polacrilex (NICORETTE) 2 MG gum Use 3 times daily to stop smoking 04/30/23   Storm Frisk, MD  olopatadine (PATANOL) 0.1 % ophthalmic solution Place 1 drop into both eyes 2 (two) times daily. Patient not taking: Reported on 04/30/2023 12/26/22   Storm Frisk, MD  Semaglutide, 1 MG/DOSE, 4 MG/3ML  SOPN Inject 1 mg as directed once a week. Patient not taking: Reported on 05/27/2023 04/30/23   Storm Frisk, MD  Vitamin D, Cholecalciferol, 25 MCG (1000 UT) TABS Take 1 tablet by mouth once daily Patient not taking: Reported on 05/27/2023 02/18/19   Tysinger, Kermit Balo, PA-C      Allergies    Lisinopril, Potassium chloride, Pneumococcal vaccines, and Vicodin [hydrocodone-acetaminophen]    Review of Systems   Review of Systems  Constitutional:  Negative for activity change, appetite change and fever.  HENT:  Negative for congestion and rhinorrhea.   Respiratory:  Negative for cough, chest tightness and shortness of breath.   Cardiovascular:  Negative for chest pain.  Gastrointestinal:  Negative for abdominal pain, nausea and vomiting.  Genitourinary:  Negative for dysuria and hematuria.  Musculoskeletal:  Negative for arthralgias and myalgias.  Skin:  Positive for wound.  Neurological:  Negative for weakness and headaches.   all other systems are negative except as noted in the HPI and PMH.    Physical Exam Updated Vital Signs BP (!) 170/104 (BP Location: Right Arm)   Pulse 79   Temp 98 F (36.7 C)   Resp 18   Ht 5\' 11"  (1.803 m)   Wt 110.2 kg   LMP 06/29/2015 (Exact Date)   SpO2 100%   BMI 33.89 kg/m  Physical Exam Vitals and nursing note reviewed.  Constitutional:      General: She is not in acute distress.    Appearance: She is well-developed.  HENT:     Head: Normocephalic and atraumatic.     Mouth/Throat:     Pharynx: No oropharyngeal exudate.  Eyes:     Conjunctiva/sclera: Conjunctivae normal.     Pupils: Pupils are equal, round, and reactive to light.  Neck:     Comments: No meningismus. Cardiovascular:     Rate and Rhythm: Normal rate and regular rhythm.     Heart sounds: Normal heart sounds. No murmur heard. Pulmonary:     Effort: Pulmonary effort is normal. No respiratory distress.     Breath sounds: Normal breath sounds.  Abdominal:     Palpations:  Abdomen is soft.     Tenderness: There is no abdominal tenderness. There is no guarding or rebound.  Musculoskeletal:        General: Signs of injury present. No tenderness. Normal range of motion.     Cervical back: Normal range of motion and neck supple.     Comments: Bleeding avulsion right medial great toe.  Intact DP and PT pulse.  Skin:    General: Skin is warm.  Neurological:     Mental Status: She is alert and oriented to person, place, and time.     Cranial  Nerves: No cranial nerve deficit.     Motor: No abnormal muscle tone.     Coordination: Coordination normal.     Comments:  5/5 strength throughout. CN 2-12 intact.Equal grip strength.   Psychiatric:        Behavior: Behavior normal.    ED Results / Procedures / Treatments   Labs (all labs ordered are listed, but only abnormal results are displayed) Labs Reviewed - No data to display  EKG None  Radiology DG Toe Great Right  Result Date: 06/21/2023 CLINICAL DATA:  Right great toe injury. EXAM: RIGHT GREAT TOE COMPARISON:  Right foot series 02/18/2022 FINDINGS: Interval healing of the previously noted acute fifth toe proximal phalanx fracture. Mild soft tissue swelling of the great toe. There is a shallow soft tissue defect in the medial aspect of the midportion of the great toe, level of the proximal metaphysis of the great toe distal phalanx. There is no evidence of fracture or adjacent osteomyelitis. 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 IMPRESSION: Shallow soft tissue defect in the medial mid great toe. No evidence of fracture or osteomyelitis. Electronically Signed   By: Almira Bar M.D.   On: 06/21/2023 05:04    Procedures Procedures    Medications Ordered in ED Medications - No data to display  ED Course/ Medical Decision Making/ A&P                                 Medical Decision Making Amount and/or Complexity of  Data Reviewed Labs: ordered. Decision-making details documented in ED Course. Radiology: ordered and independent interpretation performed. Decision-making details documented in ED Course. ECG/medicine tests: ordered and independent interpretation performed. Decision-making details documented in ED Course.  Risk Prescription drug management.   Bleeding avulsion right great toe.  Neurovascularly intact.  Will update tetanus, apply topical TXA, irrigate wound.  Will need prophylactic antibiotics given her diabetes.  X-ray obtained in triage is negative for fracture or foreign body.  Results reviewed and interpreted by me.  Patient given topical TXA for toe avulsion.  On recheck bleeding has subsided and is controlled.  Will place pressure dressing.  Tetanus is updated.  Prophylactic antibiotics given her diabetes history.  Discussed keeping wound clean and dry for 24 hours and then gentle soap and water starting tomorrow. Followup with PCP for recheck.  Return precautions discussed.        Final Clinical Impression(s) / ED Diagnoses Final diagnoses:  Avulsion of toe, initial encounter    Rx / DC Orders ED Discharge Orders          Ordered    amoxicillin-clavulanate (AUGMENTIN) 875-125 MG tablet  Every 12 hours        06/21/23 0701              Glynn Octave, MD 06/21/23 0700    Glynn Octave, MD 06/21/23 [phone]

## 2023-06-21 NOTE — ED Triage Notes (Signed)
Pt reports scraping big toe on something and is concerned because she is a diabetic. Bleeding controlled in triage

## 2023-06-23 ENCOUNTER — Ambulatory Visit: Payer: 59 | Admitting: Physician Assistant

## 2023-06-23 ENCOUNTER — Other Ambulatory Visit: Payer: Self-pay

## 2023-06-23 ENCOUNTER — Encounter: Payer: Self-pay | Admitting: Physician Assistant

## 2023-06-23 ENCOUNTER — Ambulatory Visit: Payer: Self-pay

## 2023-06-23 VITALS — BP 162/93 | HR 92 | Ht 71.0 in | Wt 243.0 lb

## 2023-06-23 DIAGNOSIS — J4541 Moderate persistent asthma with (acute) exacerbation: Secondary | ICD-10-CM | POA: Diagnosis not present

## 2023-06-23 DIAGNOSIS — R051 Acute cough: Secondary | ICD-10-CM | POA: Diagnosis not present

## 2023-06-23 DIAGNOSIS — I10 Essential (primary) hypertension: Secondary | ICD-10-CM | POA: Diagnosis not present

## 2023-06-23 MED ORDER — PREDNISONE 20 MG PO TABS
ORAL_TABLET | ORAL | 0 refills | Status: AC
Start: 2023-06-23 — End: 2023-07-01
  Filled 2023-06-23: qty 13, 8d supply, fill #0

## 2023-06-23 MED ORDER — BENZONATATE 100 MG PO CAPS
100.0000 mg | ORAL_CAPSULE | Freq: Three times a day (TID) | ORAL | 0 refills | Status: DC | PRN
Start: 2023-06-23 — End: 2023-08-07
  Filled 2023-06-23: qty 20, 4d supply, fill #0

## 2023-06-23 NOTE — Progress Notes (Signed)
Established Patient Office Visit  Subjective   Patient ID: Sydney Williams, female    DOB: 02/16/1966  Age: 57 y.o. MRN: [id number]  Chief Complaint  Patient presents with   Asthma    States that she has been experiencing wheezing and a dry cough for the past 3 days.  States the cough is worse at night.   Denies any other upper respiratory symptoms.  Is eating and drinking okay.  Denies sick contacts, but does endorse that her client used bug spray and believes that "activated asthma".  States that she has been using her daily inhaler, did use a breathing treatment and states that she has been using her albuterol inhaler 3-4 times a day with mild relief.  States that she does not check her blood pressure at home, states that it is normally "low" when she has it checked.  States that she has been taking her blood pressure medications as directed.    Past Medical History:  Diagnosis Date   Asthma    2015 hospitaliation for asthma   Diabetes mellitus    Type II   Eczema 05/16/2022   Family history of breast cancer    Fibroids 2010   Genital herpes    Genital herpes 11/30/2014   GERD (gastroesophageal reflux disease)    Hyperlipidemia    Hypertension    Impaired fasting blood sugar    Migraines    Obesity    Right carpal tunnel syndrome    Smoker    Social History   Socioeconomic History   Marital status: Legally Separated    Spouse name: Not on file   Number of children: Not on file   Years of education: Not on file   Highest education level: Not on file  Occupational History   Not on file  Tobacco Use   Smoking status: Every Day    Current packs/day: 0.25    Average packs/day: 0.3 packs/day for 14.0 years (3.5 ttl pk-yrs)    Types: Cigarettes   Smokeless tobacco: Never  Vaping Use   Vaping status: Never Used  Substance and Sexual Activity   Alcohol use: Yes    Comment: occ   Drug use: No   Sexual activity: Yes    Birth control/protection: Surgical  Other  Topics Concern   Not on file  Social History Narrative   Married, and her granddaughter lives with her, works as Lawyer at [employer], exercise - activity at work, walking.  12/2016   Social Determinants of Health   Financial Resource Strain: Not on file  Food Insecurity: No Food Insecurity (04/11/2022)   Hunger Vital Sign    Worried About Running Out of Food in the Last Year: Never true    Ran Out of Food in the Last Year: Never true  Transportation Needs: No Transportation Needs (04/11/2022)   PRAPARE - Administrator, Civil Service (Medical): No    Lack of Transportation (Non-Medical): No  Physical Activity: Not on file  Stress: Not on file  Social Connections: Not on file  Intimate Partner Violence: Not on file   Family History  Problem Relation Age of Onset   Diabetes Mother    Hypertension Mother    Aneurysm Mother    Stroke Mother    Cancer Mother        cervical cancer   Breast cancer Maternal Aunt    Cancer Maternal Aunt        breast   Cancer Cousin  breast/breast   Lupus Cousin    Heart disease Neg Hx    Colon cancer Neg Hx    Allergic rhinitis Neg Hx    Angioedema Neg Hx    Asthma Neg Hx    Atopy Neg Hx    Eczema Neg Hx    Immunodeficiency Neg Hx    Urticaria Neg Hx    Allergies  Allergen Reactions   Lisinopril Swelling   Potassium Chloride Shortness Of Breath and Swelling    Tolerates IV KCl, reaction only to PO product   Pneumococcal Vaccines Swelling and Other (See Comments)    Reaction:  Swelling at injection site   Vicodin [Hydrocodone-Acetaminophen] Itching and Rash    Review of Systems  Constitutional:  Negative for chills and fever.  HENT:  Negative for ear pain, sinus pain and sore throat.   Respiratory:  Positive for cough, shortness of breath and wheezing. Negative for sputum production.   Cardiovascular:  Negative for chest pain.  Gastrointestinal:  Negative for nausea and vomiting.  Genitourinary: Negative.    Musculoskeletal: Negative.   Skin: Negative.   Neurological: Negative.   Endo/Heme/Allergies: Negative.   Psychiatric/Behavioral: Negative.        Objective:     BP (!) 162/93   Pulse 92   Ht 5\' 11"  (1.803 m)   Wt 243 lb (110.2 kg)   LMP 06/29/2015 (Exact Date)   SpO2 98%   BMI 33.89 kg/m  BP Readings from Last 3 Encounters:  06/23/23 (!) 162/93  06/21/23 (!) 170/104  05/27/23 (!) 141/89   Wt Readings from Last 3 Encounters:  06/23/23 243 lb (110.2 kg)  06/21/23 243 lb (110.2 kg)  05/27/23 244 lb (110.7 kg)    Physical Exam Constitutional:      Appearance: Normal appearance.  HENT:     Head: Normocephalic and atraumatic.     Right Ear: External ear normal.     Left Ear: External ear normal.     Nose: Nose normal.     Mouth/Throat:     Mouth: Mucous membranes are moist.  Eyes:     Extraocular Movements: Extraocular movements intact.     Conjunctiva/sclera: Conjunctivae normal.     Pupils: Pupils are equal, round, and reactive to light.  Cardiovascular:     Rate and Rhythm: Normal rate and regular rhythm.     Pulses: Normal pulses.     Heart sounds: Normal heart sounds.  Pulmonary:     Effort: Pulmonary effort is normal. No respiratory distress.     Breath sounds: Wheezing present.     Comments: Slight wheeze noted bilaterally  Musculoskeletal:        General: Normal range of motion.     Cervical back: Normal range of motion and neck supple.  Skin:    General: Skin is warm and dry.  Neurological:     General: No focal deficit present.     Mental Status: She is alert and oriented to person, place, and time.  Psychiatric:        Mood and Affect: Mood normal.        Behavior: Behavior normal.        Thought Content: Thought content normal.        Judgment: Judgment normal.          Assessment & Plan:   Problem List Items Addressed This Visit       Respiratory   Asthma, moderate persistent - Primary   Relevant Medications   predniSONE  (  DELTASONE) 20 MG tablet   Other Visit Diagnoses     Acute cough       Relevant Medications   benzonatate (TESSALON) 100 MG capsule   Elevated blood pressure reading in office with diagnosis of hypertension          1. Moderate persistent asthma with acute exacerbation Trial prednisone taper.  Patient education given on supportive care.  Red flags given for prompt reevaluation - predniSONE (DELTASONE) 20 MG tablet; Take 3 tablets (60 mg total) by mouth daily with breakfast for 2 days, THEN 2 tablets (40 mg total) daily with breakfast for 2 days, THEN 1 tablet (20 mg total) daily with breakfast for 2 days, THEN 0.5 tablets (10 mg total) daily with breakfast for 2 days.  Dispense: 13 tablet; Refill: 0  2. Acute cough Trial Tessalon Perles. - benzonatate (TESSALON) 100 MG capsule; Take 1-2 caps PO TID PRN  Dispense: 20 capsule; Refill: 0  3. Elevated blood pressure reading in office with diagnosis of hypertension Patient encouraged to check blood pressure at home, keep a written log and have available for all office visits.   I have reviewed the patient's medical history (PMH, PSH, Social History, Family History, Medications, and allergies) , and have been updated if relevant. I spent 30 minutes reviewing chart and  face to face time with patient.    Return if symptoms worsen or fail to improve.    Kasandra Knudsen Mayers, PA-C

## 2023-06-23 NOTE — Telephone Encounter (Signed)
Noted  

## 2023-06-23 NOTE — Telephone Encounter (Signed)
Chief Complaint: wheezing Symptoms: bilateral chest pain  Frequency: Sat Pertinent Negatives: Patient denies Fever Disposition: [] ED /[] Urgent Care (no appt availability in office) / [] Appointment(In office/virtual)/ []  [facility]/ [] Home Care/ [] Refused Recommended Disposition /[x] [facility]/ []  Follow-up with PCP Additional Notes: no appt Answer Assessment - Initial Assessment Questions 1. RESPIRATORY STATUS: "Describe your breathing?" (e.g., wheezing, shortness of breath, unable to speak, severe coughing)      Wheezing  2. ONSET: "When did this breathing problem begin?"      Sat  4. SEVERITY: "How bad is your breathing?" (e.g., mild, moderate, severe)    - MILD: No SOB at rest, mild SOB with walking, speaks normally in sentences, can lie down, no retractions, pulse < 100.    - MODERATE: SOB at rest, SOB with minimal exertion and prefers to sit, cannot lie down flat, speaks in phrases, mild retractions, audible wheezing, pulse 100-120.    - SEVERE: Very SOB at rest, speaks in single words, struggling to breathe, sitting hunched forward, retractions, pulse > 120      moderate 5. RECURRENT SYMPTOM: "Have you had difficulty breathing before?" If Yes, ask: "When was the last time?" and "What happened that time?"      yes 7. LUNG HISTORY: "Do you have any history of lung disease?"  (e.g., pulmonary embolus, asthma, emphysema)     Asthma  8. CAUSE: "What do you think is causing the breathing problem?"      Asthma flare 9. OTHER SYMPTOMS: "Do you have any other symptoms? (e.g., dizziness, runny nose, cough, chest pain, fever)  bilateral   Chest pain  Answer Assessment - Initial Assessment Questions 1. RESPIRATORY STATUS: "Describe your breathing?" (e.g., wheezing, shortness of breath, unable to speak, severe coughing)      wheezing 2. ONSET: "When did this asthma attack begin?"      Sat 5. SEVERITY: "How bad is this attack?"    - MILD: No SOB at rest, mild SOB  with walking, speaks normally in sentences, can lie down, no retractions, pulse < 100. (GREEN Zone: PEFR 80-100%)   - MODERATE: SOB at rest, SOB with minimal exertion and prefers to sit, cannot lie down flat, speaks in phrases, mild retractions, audible wheezing, pulse 100-120. (YELLOW Zone: PEFR 50-79%)    - SEVERE: Struggling for each breath, speaks in single words, struggling to breathe, sitting hunched forward, retractions, usually loud wheezing, sometimes minimal wheezing because of decreased air movement, pulse > 120. (RED Zone: PEFR < 50%).      mild 6. ASTHMA MEDICINES:  "What treatments have you tried?"    - INHALED QUICK RELIEF (RESCUE): "What is your inhaled quick-relief medicine?" (e.g., albuterol, salbutamol) "Do you use an inhaler or a nebulizer?" "How frequently have you been using this medicine?"   - CONTROLLER (LONG-TERM-CONTROL): "Do you take an inhaled steroid? (e.g., Asmanex, Flovent, Pulmicort, Qvar)     quick 7. INHALED QUICK-RELIEF TREATMENTS FOR THIS ATTACK: "What treatments have you given yourself so far?" and "How many and how often?" If using an inhaler, ask, "How many puffs?" Note: Routine treatments are 2 puffs every 4 hours as needed. Rescue treatments are 4 puffs repeated every 20 minutes, up to three times as needed.      Has been taking Albuterol no improvement  8. OTHER SYMPTOMS: "Do you have any other symptoms? (e.g., chest pain, coughing up yellow sputum, fever, runny nose)     Bilateral chest pain  Protocols used: Breathing Difficulty-A-AH, Asthma Attack-A-AH

## 2023-06-23 NOTE — Patient Instructions (Signed)
You are going to use a steroid taper to help you with your asthma exacerbation.  I also sent a prescription for Tessalon Perles to help you with your cough.  Make sure that you are staying well-hydrated, and get plenty of rest.  Your blood pressure is elevated today, I do encourage you to check your blood pressure at home, keep a written log and have available for all office visits.  I hope that you feel better soon, please let us know if there is anything else we can do for you.  Roney Jaffe, PA-C Physician Assistant [facility] Medicine [email or web address]   Prednisone Tablets What is this medication? PREDNISONE (PRED ni sone) treats many conditions such as asthma, allergic reactions, arthritis, inflammatory bowel diseases, adrenal, and blood or bone marrow disorders. It works by decreasing inflammation, slowing down an overactive immune system, or replacing cortisol normally made in the body. Cortisol is a hormone that plays an important role in how the body responds to stress, illness, and injury. It belongs to a group of medications called steroids. This medicine may be used for other purposes; ask your health care provider or pharmacist if you have questions. COMMON BRAND NAME(S): Deltasone, Predone, Sterapred, Sterapred DS What should I tell my care team before I take this medication? They need to know if you have any of these conditions: Cushing's syndrome Diabetes Glaucoma Heart disease High blood pressure Infection (especially a virus infection such as chickenpox, cold sores, or herpes) Kidney disease Liver disease Mental illness Myasthenia gravis Osteoporosis Seizures Stomach or intestine problems Thyroid disease An unusual or allergic reaction to lactose, prednisone, other medications, foods, dyes, or preservatives Pregnant or trying to get pregnant Breast-feeding How should I use this medication? Take this medication  by mouth with a glass of water. Follow the directions on the prescription label. Take this medication with food. If you are taking this medication once a day, take it in the morning. Do not take more medication than you are told to take. Do not suddenly stop taking your medication because you may develop a severe reaction. Your care team will tell you how much medication to take. If your care team wants you to stop the medication, the dose may be slowly lowered over time to avoid any side effects. Talk to your care team about the use of this medication in children. Special care may be needed. Overdosage: If you think you have taken too much of this medicine contact a poison control center or emergency room at once. NOTE: This medicine is only for you. Do not share this medicine with others. What if I miss a dose? If you miss a dose, take it as soon as you can. If it is almost time for your next dose, talk to your care team. You may need to miss a dose or take an extra dose. Do not take double or extra doses without advice. What may interact with this medication? Do not take this medication with any of the following: Metyrapone Mifepristone This medication may also interact with the following: Aminoglutethimide Amphotericin B Aspirin and aspirin-like medications Barbiturates Certain medications for diabetes, like glipizide or glyburide Cholestyramine Cholinesterase inhibitors Cyclosporine Digoxin Diuretics Ephedrine Female hormones, like estrogens and birth control pills Isoniazid Ketoconazole NSAIDS, medications for pain and inflammation, like ibuprofen or naproxen Phenytoin Rifampin Toxoids Vaccines Warfarin This list may not describe all possible interactions. Give your health care provider a list of all the medicines, herbs, non-prescription drugs, or dietary supplements  you use. Also tell them if you smoke, drink alcohol, or use illegal drugs. Some items may interact with your  medicine. What should I watch for while using this medication? Visit your care team for regular checks on your progress. If you are taking this medication over a prolonged period, carry an identification card with your name and address, the type and dose of your medication, and your care team's name and address. This medication may increase your risk of getting an infection. Tell your care team if you are around anyone with measles or chickenpox, or if you develop sores or blisters that do not heal properly. If you are going to have surgery, tell your care team that you have taken this medication within the last twelve months. Ask your care team about your diet. You may need to lower the amount of salt you eat. This medication may increase blood sugar. Ask your care team if changes in diet or medications are needed if you have diabetes. What side effects may I notice from receiving this medication? Side effects that you should report to your care team as soon as possible: Allergic reactions--skin rash, itching, hives, swelling of the face, lips, tongue, or throat Cushing syndrome--increased fat around the midsection, upper back, neck, or face, pink or purple stretch marks on the skin, thinning, fragile skin that easily bruises, unexpected hair growth High blood sugar (hyperglycemia)--increased thirst or amount of urine, unusual weakness or fatigue, blurry vision Increase in blood pressure Infection--fever, chills, cough, sore throat, wounds that don't heal, pain or trouble when passing urine, general feeling of discomfort or being unwell Low adrenal gland function--nausea, vomiting, loss of appetite, unusual weakness or fatigue, dizziness Mood and behavior changes--anxiety, nervousness, confusion, hallucinations, irritability, hostility, thoughts of suicide or self-harm, worsening mood, feelings of depression Stomach bleeding--bloody or black, tar-like stools, vomiting blood or brown material that  looks like coffee grounds Swelling of the ankles, hands, or feet Side effects that usually do not require medical attention (report to your care team if they continue or are bothersome): Acne General discomfort and fatigue Headache Increase in appetite Nausea Trouble sleeping Weight gain This list may not describe all possible side effects. Call your doctor for medical advice about side effects. You may report side effects to FDA at 1-800-FDA-1088. Where should I keep my medication? Keep out of the reach of children. Store at room temperature between 15 and 30 degrees C (59 and 86 degrees F). Protect from light. Keep container tightly closed. Throw away any unused medication after the expiration date. NOTE: This sheet is a summary. It may not cover all possible information. If you have questions about this medicine, talk to your doctor, pharmacist, or health care provider.  2024 Elsevier/Gold Standard (2021-01-12 00:00:00)

## 2023-07-02 ENCOUNTER — Other Ambulatory Visit: Payer: Self-pay

## 2023-07-02 ENCOUNTER — Telehealth: Payer: Self-pay

## 2023-07-02 NOTE — Telephone Encounter (Signed)
Transition Care Management Unsuccessful Follow-up Telephone Call  Date of discharge and from where:  06/21/2023 [facility]  Attempts:  1st Attempt  Reason for unsuccessful TCM follow-up call:  Left voice message  [facility]  [facility] Population Health Community Resource Care Guide   [name]  ?? [phone]   Website: [email or web address]  [email or web address]

## 2023-07-03 ENCOUNTER — Telehealth: Payer: Self-pay

## 2023-07-03 NOTE — Telephone Encounter (Signed)
Transition Care Management Unsuccessful Follow-up Telephone Call  Date of discharge and from where:  06/21/2023 [facility]  Attempts:  2nd Attempt  Reason for unsuccessful TCM follow-up call:  Left voice message  [facility]  [facility] Population Health Community Resource Care Guide   [email or web address]  ?? [phone]   Website: [email or web address]  [email or web address]

## 2023-07-08 ENCOUNTER — Other Ambulatory Visit: Payer: Self-pay

## 2023-07-09 DIAGNOSIS — E119 Type 2 diabetes mellitus without complications: Secondary | ICD-10-CM | POA: Diagnosis not present

## 2023-07-09 DIAGNOSIS — H02224 Mechanical lagophthalmos left upper eyelid: Secondary | ICD-10-CM | POA: Diagnosis not present

## 2023-07-09 DIAGNOSIS — H35033 Hypertensive retinopathy, bilateral: Secondary | ICD-10-CM | POA: Diagnosis not present

## 2023-07-09 DIAGNOSIS — H04123 Dry eye syndrome of bilateral lacrimal glands: Secondary | ICD-10-CM | POA: Diagnosis not present

## 2023-07-09 DIAGNOSIS — H02221 Mechanical lagophthalmos right upper eyelid: Secondary | ICD-10-CM | POA: Diagnosis not present

## 2023-07-09 DIAGNOSIS — H2513 Age-related nuclear cataract, bilateral: Secondary | ICD-10-CM | POA: Diagnosis not present

## 2023-07-09 DIAGNOSIS — H524 Presbyopia: Secondary | ICD-10-CM | POA: Diagnosis not present

## 2023-07-09 LAB — HM DIABETES EYE EXAM

## 2023-07-14 ENCOUNTER — Other Ambulatory Visit: Payer: Self-pay

## 2023-07-15 ENCOUNTER — Other Ambulatory Visit: Payer: Self-pay

## 2023-07-18 ENCOUNTER — Other Ambulatory Visit: Payer: Self-pay

## 2023-08-01 ENCOUNTER — Other Ambulatory Visit: Payer: Self-pay

## 2023-08-07 ENCOUNTER — Ambulatory Visit: Payer: 59 | Admitting: Medical

## 2023-08-07 VITALS — BP 120/70 | HR 89 | Ht 70.0 in | Wt 253.8 lb

## 2023-08-07 DIAGNOSIS — J4541 Moderate persistent asthma with (acute) exacerbation: Secondary | ICD-10-CM

## 2023-08-07 DIAGNOSIS — I10 Essential (primary) hypertension: Secondary | ICD-10-CM

## 2023-08-07 DIAGNOSIS — Z1211 Encounter for screening for malignant neoplasm of colon: Secondary | ICD-10-CM

## 2023-08-07 DIAGNOSIS — E790 Hyperuricemia without signs of inflammatory arthritis and tophaceous disease: Secondary | ICD-10-CM | POA: Diagnosis not present

## 2023-08-07 DIAGNOSIS — E119 Type 2 diabetes mellitus without complications: Secondary | ICD-10-CM | POA: Diagnosis not present

## 2023-08-07 DIAGNOSIS — E782 Mixed hyperlipidemia: Secondary | ICD-10-CM | POA: Diagnosis not present

## 2023-08-07 DIAGNOSIS — H9202 Otalgia, left ear: Secondary | ICD-10-CM

## 2023-08-07 DIAGNOSIS — I700 Atherosclerosis of aorta: Secondary | ICD-10-CM

## 2023-08-07 LAB — POCT GLYCOSYLATED HEMOGLOBIN (HGB A1C): Hemoglobin A1C: 6 % — AB (ref 4.0–5.6)

## 2023-08-07 NOTE — Progress Notes (Signed)
Subjective:  Sydney Williams is a 57 y.o. female who presents for Chief Complaint  Patient presents with   Medical Management of Chronic Issues    Med check and diabetes. Headaches x a couple months.  Had eye exam done last week- requested results. Flu and covid shots done 07/28/23. Will go back in a couple weeks for shingles     Here for med check .   Last visit here 2019.  Was seeing a different primary care for a while due to insurance change  Here for recheck on chronic issues  She has been doing well, compliant with medications.  Hypertension-compliant with amlodipine 10 mg daily, chlorthalidone 25 mg, 1/2 tablet daily, metoprolol 50 mg twice daily  Hyperlipidemia-compliant with Lipitor 40 mg daily and aspirin 81 mg daily  History of gout, elevated uric acid-had 1 a while without allopurinol but recently was started back after having a gout flare.  Uses colchicine as needed  Diabetes-currently on metformin 500 mg, 2 tablets twice a day and Ozempic 1 mg weekly.  She started Ozempic in the past year.  She was at 305 pounds and has lost significant weight on the Ozempic and is really happy her progress.  Checking blood sugar weekly, typically under 100 blood sugar  She has intermittent discomfort in her left ear, feels like water or wetness in her face when she eats.  Wants this checked out.  Been getting some headaches as well.  No other URI symptoms.  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    No other c/o.  Past Medical History:  Diagnosis Date   Asthma    2015 hospitaliation for asthma   Diabetes mellitus    Type II   Eczema 05/16/2022   Family history of breast cancer    Fibroids 2010   Genital herpes    Genital herpes 11/30/2014   GERD (gastroesophageal reflux disease)    Hyperlipidemia    Hypertension     Impaired fasting blood sugar    Migraines    Obesity    Right carpal tunnel syndrome    Smoker    Current Outpatient Medications on File Prior to Visit  Medication Sig Dispense Refill   acyclovir (ZOVIRAX) 200 MG capsule Take 1 capsule (200 mg total) by mouth 2 (two) times daily. 60 capsule 2   albuterol (VENTOLIN HFA) 108 (90 Base) MCG/ACT inhaler Inhale 1-2 puffs into the lungs every 6 (six) hours as needed for wheezing or shortness of breath. 18 g 0   allopurinol (ZYLOPRIM) 100 MG tablet Take 1 tablet (100 mg total) by mouth daily. 30 tablet 6   amLODipine (NORVASC) 10 MG tablet Take 1 tablet (10 mg total) by mouth daily to lower blood pressure. 90 tablet 2   ascorbic acid (VITAMIN C) 500 MG tablet Take 500 mg by mouth daily.     aspirin (EQ ASPIRIN ADULT LOW DOSE) 81 MG EC tablet Take 1 tablet (81 mg total) by mouth daily. Swallow whole. 90 tablet 3   atorvastatin (LIPITOR) 40 MG tablet Take 1 tablet (40 mg total) by mouth daily. 90 tablet 2   chlorthalidone (HYGROTON) 25 MG tablet Take 0.5 tablets (12.5 mg total) by mouth daily. 45 tablet 2   colchicine 0.6 MG tablet Take 1 tablet (0.6 mg total) by mouth daily as  needed (gout flare). 30 tablet 2   ferrous sulfate 325 (65 FE) MG tablet Take 325 mg by mouth daily with breakfast.     Fluticasone-Umeclidin-Vilant 200-62.5-25 MCG/ACT AEPB Inhale 1 puff into the lungs daily. Rinse mouth after each use. 60 each 4   metFORMIN (GLUCOPHAGE) 500 MG tablet Take 2 tablets (1,000 mg total) by mouth 2 (two) times daily with a meal. 360 tablet 1   metoprolol tartrate (LOPRESSOR) 50 MG tablet Take 1 tablet (50 mg total) by mouth 2 (two) times daily. 60 tablet 2   Semaglutide, 1 MG/DOSE, 4 MG/3ML SOPN Inject 1 mg as directed once a week. 3 mL 4   Insulin Pen Needle (B-D ULTRAFINE III SHORT PEN) 31G X 8 MM MISC Use as needed. (Patient not taking: Reported on 04/30/2023) 100 each 2   No current facility-administered medications on file prior to visit.      The following portions of the patient's history were reviewed and updated as appropriate: allergies, current medications, past family history, past medical history, past social history, past surgical history and problem list.  ROS Otherwise as in subjective above    Objective: BP 120/70   Pulse 89   Ht 5\' 10"  (1.778 m)   Wt 253 lb 12.8 oz (115.1 kg)   LMP 06/29/2015 (Exact Date)   BMI 36.42 kg/m   General appearance: alert, no distress, well developed, well nourished HEENT: normocephalic, sclerae anicteric, conjunctiva pink and moist, TMs pearly, nares patent, no discharge or erythema, pharynx normal Oral cavity: MMM, no lesions Neck: supple, no lymphadenopathy, no thyromegaly, no masses Heart: RRR, normal S1, S2, no murmurs Lungs: CTA bilaterally, no wheezes, rhonchi, or rales Pulses: 2+ radial pulses, 2+ pedal pulses, normal cap refill Ext: no edema   Assessment: Encounter Diagnoses  Name Primary?   Type 2 diabetes mellitus without complication, without long-term current use of insulin (HCC) Yes   Mixed hyperlipidemia    Aortic atherosclerosis (HCC)    Moderate persistent asthma with acute exacerbation    Essential hypertension    Elevated blood uric acid level    Discomfort of left ear    Screen for colon cancer      Plan: Diabetes Continue yearly eye doctor visit Check your feet daily for sores or wounds continue glucose monitoring as you are doing Continue Ozempic 1 mg weekly Continue metformin 500 mg 2 tablets twice daily You are currently using a One Touch glucometer checking once weekly Thankfully your A1c is 6.0% today, at goal   High blood pressure Blood pressure looks great Continue amlodipine 10 mg daily Continue metoprolol 50 mg twice daily.  Keep in mind there is an XL once daily version of this.  if insurance pays for this we could change to the once daily version Continue chlorthalidone 25 mg, 1/2 tablet daily   High  cholesterol Continue atorvastatin Lipitor 40 mg daily Continue aspirin 81 mg daily Your last cholesterol in February 2024 look fine so continue current treatment   Asthma You are currently doing well on your daily prevention inhaler, continue albuterol rescue inhaler as needed   Gout, elevated uric acid Continue allopurinol 100 mg daily Colchicine is as needed for flareups of gout   Congratulations on your weight loss   Continue to eat healthy things and exercise regularly   Regarding your ear pressure/facial pain, consider using over-the-counter Flonase or Nasacort nasal spray in case this is related to allergies and ear pressure from fluid.  You could also consider over-the-counter  allergy pill such as Zyrtec at bedtime to see if that helps at all.  There is no obvious abnormality on your exam today   Screen for colon cancer-I am sending you home with a fecal tube test today.  Please return this.  Your next colonoscopy is due in 2027     Lundyn was seen today for medical management of chronic issues.  Diagnoses and all orders for this visit:  Type 2 diabetes mellitus without complication, without long-term current use of insulin (HCC) -     HgB A1c  Mixed hyperlipidemia  Aortic atherosclerosis (HCC)  Moderate persistent asthma with acute exacerbation  Essential hypertension  Elevated blood uric acid level  Discomfort of left ear  Screen for colon cancer -     Fecal occult blood, imunochemical    Follow up: 6 months for physical

## 2023-08-07 NOTE — Patient Instructions (Signed)
Diabetes Continue yearly eye doctor visit Check your feet daily for sores or wounds continue glucose monitoring as you are doing Continue Ozempic 1 mg weekly Continue metformin 500 mg 2 tablets twice daily You are currently using a One Touch glucometer checking once weekly Thankfully your A1c is 6.0% today, at goal   High blood pressure Blood pressure looks great Continue amlodipine 10 mg daily Continue metoprolol 50 mg twice daily.  Keep in mind there is an XL once daily version of this.  if insurance pays for this we could change to the once daily version Continue chlorthalidone 25 mg, 1/2 tablet daily   High cholesterol Continue atorvastatin Lipitor 40 mg daily Continue aspirin 81 mg daily Your last cholesterol in February 2024 look fine so continue current treatment   Asthma You are currently doing well on your daily prevention inhaler, continue albuterol rescue inhaler as needed   Gout, elevated uric acid Continue allopurinol 100 mg daily Colchicine is as needed for flareups of gout   Congratulations on your weight loss   Continue to eat healthy things and exercise regularly   Regarding your ear pressure/facial pain, consider using over-the-counter Flonase or Nasacort nasal spray in case this is related to allergies and ear pressure from fluid.  You could also consider over-the-counter allergy pill such as Zyrtec at bedtime to see if that helps at all.  There is no obvious abnormality on your exam today   Screen for colon cancer-I am sending you home with a fecal tube test today.  Please return this.  Your next colonoscopy is due in 2027

## 2023-08-12 ENCOUNTER — Encounter: Payer: Self-pay | Admitting: Internal Medicine

## 2023-08-16 ENCOUNTER — Other Ambulatory Visit: Payer: Self-pay

## 2023-08-18 ENCOUNTER — Other Ambulatory Visit: Payer: Self-pay

## 2023-08-20 ENCOUNTER — Other Ambulatory Visit: Payer: Self-pay

## 2023-09-01 ENCOUNTER — Other Ambulatory Visit: Payer: Self-pay

## 2023-09-08 ENCOUNTER — Other Ambulatory Visit: Payer: Self-pay

## 2023-09-17 ENCOUNTER — Other Ambulatory Visit: Payer: Self-pay

## 2023-09-17 ENCOUNTER — Ambulatory Visit (INDEPENDENT_AMBULATORY_CARE_PROVIDER_SITE_OTHER): Payer: 59 | Admitting: Medical

## 2023-09-17 VITALS — BP 124/82 | HR 82 | Temp 97.6°F | Wt 255.4 lb

## 2023-09-17 DIAGNOSIS — B3731 Acute candidiasis of vulva and vagina: Secondary | ICD-10-CM | POA: Diagnosis not present

## 2023-09-17 DIAGNOSIS — S76211A Strain of adductor muscle, fascia and tendon of right thigh, initial encounter: Secondary | ICD-10-CM

## 2023-09-17 DIAGNOSIS — L304 Erythema intertrigo: Secondary | ICD-10-CM | POA: Diagnosis not present

## 2023-09-17 LAB — POCT URINALYSIS DIP (PROADVANTAGE DEVICE)
Blood, UA: NEGATIVE
Glucose, UA: NEGATIVE mg/dL
Ketones, POC UA: NEGATIVE mg/dL
Leukocytes, UA: NEGATIVE
Nitrite, UA: NEGATIVE
Protein Ur, POC: NEGATIVE mg/dL
Specific Gravity, Urine: 1
Urobilinogen, Ur: NEGATIVE
pH, UA: 7 (ref 5.0–8.0)

## 2023-09-17 LAB — POCT WET PREP (WET MOUNT)
Clue Cells Wet Prep Whiff POC: NEGATIVE
Trichomonas Wet Prep HPF POC: ABSENT
WBC, Wet Prep HPF POC: NEGATIVE

## 2023-09-17 MED ORDER — FLUCONAZOLE 100 MG PO TABS: 100.0000 mg | ORAL_TABLET | Freq: Every day | ORAL | 0 refills | Status: DC

## 2023-09-17 MED ORDER — OXYCODONE-ACETAMINOPHEN 5-325 MG PO TABS: 1.0000 | ORAL_TABLET | Freq: Three times a day (TID) | ORAL | 0 refills | Status: AC | PRN

## 2023-09-17 MED ORDER — NYSTATIN 100000 UNIT/GM EX POWD: 1.0000 | Freq: Two times a day (BID) | CUTANEOUS | 0 refills | Status: DC

## 2023-09-17 MED ORDER — NAPROXEN 500 MG PO TBEC: 500.0000 mg | DELAYED_RELEASE_TABLET | Freq: Two times a day (BID) | ORAL | 0 refills | Status: DC

## 2023-09-17 MED ORDER — TIZANIDINE HCL 4 MG PO TABS: 4.0000 mg | ORAL_TABLET | Freq: Every day | ORAL | 0 refills | Status: DC | PRN

## 2023-09-17 NOTE — Progress Notes (Signed)
Subjective:  Sydney Williams is a 57 y.o. female who presents for Chief Complaint  Patient presents with   Back Pain    Back pain and right groin area. Burning with urination. Recently on antibiotics for tooth issues     Here for pain in right lower back and right groin.   Does private duty CNA work, lifts some things on the job.  No specific injury, trauma or fall.  Thinks she may have slipped with her foot on some leaves, otherwise no other injury.  Didn't end up falling.    No numbness or tingling in the leg.     Using tylenol for symptoms.  Was on antibiotics 2 weeks ago for tooth infection but now thinks she has a yeast infection.  She has some vaginal burning and itching.  No discharge.  She also gets irritation discoloration under her breast.  She had sweating there.  Using OTC topical cream without improvement.  No other aggravating or relieving factors.    No other c/o.  Past Medical History:  Diagnosis Date   Asthma    2015 hospitaliation for asthma   Diabetes mellitus    Type II   Eczema 05/16/2022   Family history of breast cancer    Fibroids 2010   Genital herpes    Genital herpes 11/30/2014   GERD (gastroesophageal reflux disease)    Hyperlipidemia    Hypertension    Impaired fasting blood sugar    Migraines    Obesity    Right carpal tunnel syndrome    Smoker    Current Outpatient Medications on File Prior to Visit  Medication Sig Dispense Refill   acyclovir (ZOVIRAX) 200 MG capsule Take 1 capsule (200 mg total) by mouth 2 (two) times daily. 60 capsule 2   albuterol (VENTOLIN HFA) 108 (90 Base) MCG/ACT inhaler Inhale 1-2 puffs into the lungs every 6 (six) hours as needed for wheezing or shortness of breath. 18 g 0   allopurinol (ZYLOPRIM) 100 MG tablet Take 1 tablet (100 mg total) by mouth daily. 30 tablet 6   amLODipine (NORVASC) 10 MG tablet Take 1 tablet (10 mg total) by mouth daily to lower blood pressure. 90 tablet 2   ascorbic acid (VITAMIN C) 500 MG  tablet Take 500 mg by mouth daily.     aspirin (EQ ASPIRIN ADULT LOW DOSE) 81 MG EC tablet Take 1 tablet (81 mg total) by mouth daily. Swallow whole. 90 tablet 3   atorvastatin (LIPITOR) 40 MG tablet Take 1 tablet (40 mg total) by mouth daily. 90 tablet 2   chlorthalidone (HYGROTON) 25 MG tablet Take 0.5 tablets (12.5 mg total) by mouth daily. 45 tablet 2   colchicine 0.6 MG tablet Take 1 tablet (0.6 mg total) by mouth daily as needed (gout flare). 30 tablet 2   ferrous sulfate 325 (65 FE) MG tablet Take 325 mg by mouth daily with breakfast.     Fluticasone-Umeclidin-Vilant 200-62.5-25 MCG/ACT AEPB Inhale 1 puff into the lungs daily. Rinse mouth after each use. 60 each 4   metFORMIN (GLUCOPHAGE) 500 MG tablet Take 2 tablets (1,000 mg total) by mouth 2 (two) times daily with a meal. 360 tablet 1   metoprolol tartrate (LOPRESSOR) 50 MG tablet Take 1 tablet (50 mg total) by mouth 2 (two) times daily. 60 tablet 2   Semaglutide, 1 MG/DOSE, 4 MG/3ML SOPN Inject 1 mg as directed once a week. 3 mL 4   Insulin Pen Needle (B-D ULTRAFINE III SHORT  PEN) 31G X 8 MM MISC Use as needed. (Patient not taking: Reported on 04/30/2023) 100 each 2   No current facility-administered medications on file prior to visit.   Past Surgical History:  Procedure Laterality Date   BACK SURGERY  10/24/2020   CARDIAC CATHETERIZATION     CARPAL TUNNEL RELEASE Right 08/30/2020   Procedure: RIGHT CARPAL TUNNEL RELEASE;  Surgeon: Tarry Kos, MD;  Location: [facility];  Service: Orthopedics;  Laterality: Right;   COLONOSCOPY  03/13/2016   Dr. Leone Payor, normal, repeat 2027   CYST EXCISION     left neck/postauricular region, benign   DILATION AND CURETTAGE OF UTERUS     LAPAROSCOPIC ABDOMINAL EXPLORATION     removal of ectopic preg   LEFT HEART CATH AND CORONARY ANGIOGRAPHY N/A 06/23/2017   Procedure: LEFT HEART CATH AND CORONARY ANGIOGRAPHY;  Surgeon: Yvonne Kendall, MD;  Location: [facility];  Service:  Cardiovascular;  Laterality: N/A;   TUBAL LIGATION       The following portions of the patient's history were reviewed and updated as appropriate: allergies, current medications, past family history, past medical history, past social history, past surgical history and problem list.  ROS Otherwise as in subjective above  Objective: BP 124/82   Pulse 82   Temp 97.6 F (36.4 C)   Wt 255 lb 6.4 oz (115.8 kg)   LMP 06/29/2015 (Exact Date)   BMI 36.65 kg/m   General appearance: alert, no distress, well developed, well nourished Tender in the right mid to lower back paraspinal and midline, range of motion limited due to pain, no swelling.  Positive spasm right low back Abdomen: +bs, soft, she has some mild tenderness in the right lower quadrant otherwise nontender, non distended, no masses, no hepatomegaly, no splenomegaly MSK, tender over the right groin, range of motion fairly full but she does get some pain with hip range of motion in the Groin and pain with abduction of the right hip Pulses: 2+ radial pulses, 2+ pedal pulses, normal cap refill Ext: no edema Legs neurovascularly intact GU: Declined Skin irritation and discoloration under both breast   Assessment: Encounter Diagnoses  Name Primary?   Inguinal strain, right, initial encounter Yes   Yeast infection of the vagina    Intertrigo      Plan: Discussed symptoms, exam findings, recommendations as below.  Patient Instructions  Back pain, groin strain Begin Naprosyn twice daily for pain and inflammation for the next week Begin tizanidine muscle laxer once or twice a day for muscle spasm.  Caution this can cause sedation If needed you can use Percocet pain medication for worse pain, but caution as this can cause drowsiness Use relative rest, stretching Symptoms should gradually improve over the next week If not improving within the next week then let me know   Yeast infection Begin Diflucan 100 mg daily by mouth  for 1 week for yeast infection   Intertrigo under breasts Keep this area clean and dry Begin nystatin powder once or twice daily to keep things dry and to help treat fungus If not improving over the next week or 2 let me know    [name] was seen today for back pain.  Diagnoses and all orders for this visit:  Inguinal strain, right, initial encounter  Yeast infection of the vagina -     POCT Urinalysis DIP (Proadvantage Device) -     Cancel: WET PREP FOR TRICH, YEAST, CLUE -     POCT  Wet Prep [employer])  Intertrigo    Follow up: prn

## 2023-09-17 NOTE — Patient Instructions (Addendum)
Back pain, groin strain Begin Naprosyn twice daily for pain and inflammation for the next week Begin tizanidine muscle laxer once or twice a day for muscle spasm.  Caution this can cause sedation If needed you can use Percocet pain medication for worse pain, but caution as this can cause drowsiness Use relative rest, stretching Symptoms should gradually improve over the next week If not improving within the next week then let me know   Yeast infection Begin Diflucan 100 mg daily by mouth for 1 week for yeast infection   Intertrigo under breasts Keep this area clean and dry Begin nystatin powder once or twice daily to keep things dry and to help treat fungus If not improving over the next week or 2 let me know

## 2023-09-17 NOTE — Addendum Note (Signed)
Addended by: Jac Canavan on: 09/17/2023 10:12 AM   Modules accepted: Level of Service

## 2023-09-29 ENCOUNTER — Other Ambulatory Visit: Payer: Self-pay | Admitting: Medical

## 2023-09-29 ENCOUNTER — Telehealth: Payer: Self-pay | Admitting: Medical

## 2023-09-29 DIAGNOSIS — I10 Essential (primary) hypertension: Secondary | ICD-10-CM

## 2023-09-29 MED ORDER — ALLOPURINOL 100 MG PO TABS: 100.0000 mg | ORAL_TABLET | Freq: Every day | ORAL | 0 refills | Status: DC

## 2023-09-29 MED ORDER — ACYCLOVIR 200 MG PO CAPS: 200.0000 mg | ORAL_CAPSULE | Freq: Two times a day (BID) | ORAL | 2 refills | Status: DC

## 2023-09-29 MED ORDER — SEMAGLUTIDE (1 MG/DOSE) 4 MG/3ML ~~LOC~~ SOPN: 1.0000 mg | PEN_INJECTOR | SUBCUTANEOUS | 2 refills | Status: DC

## 2023-09-29 MED ORDER — METOPROLOL TARTRATE 50 MG PO TABS
50.0000 mg | ORAL_TABLET | Freq: Two times a day (BID) | ORAL | 0 refills | Status: DC
Start: 2023-09-29 — End: 2024-01-09

## 2023-09-29 NOTE — Telephone Encounter (Signed)
Pt is requesting refills on her ozempic, metoprolol, zovirax, allopurinol and insulin to be sent to [id number] - [place], [place] - [address]

## 2023-09-30 ENCOUNTER — Encounter: Payer: Self-pay | Admitting: Medical

## 2023-09-30 ENCOUNTER — Ambulatory Visit (INDEPENDENT_AMBULATORY_CARE_PROVIDER_SITE_OTHER): Payer: 59 | Admitting: Medical

## 2023-09-30 VITALS — BP 128/78 | HR 92 | Temp 98.3°F | Ht 69.0 in | Wt 253.6 lb

## 2023-09-30 DIAGNOSIS — M5489 Other dorsalgia: Secondary | ICD-10-CM | POA: Diagnosis not present

## 2023-09-30 DIAGNOSIS — R8290 Unspecified abnormal findings in urine: Secondary | ICD-10-CM | POA: Diagnosis not present

## 2023-09-30 DIAGNOSIS — M549 Dorsalgia, unspecified: Secondary | ICD-10-CM

## 2023-09-30 LAB — POCT URINALYSIS DIP (PROADVANTAGE DEVICE)
Blood, UA: NEGATIVE
Glucose, UA: NEGATIVE mg/dL
Nitrite, UA: NEGATIVE
Specific Gravity, Urine: 1.015
Urobilinogen, Ur: 0.2
pH, UA: 6 (ref 5.0–8.0)

## 2023-09-30 MED ORDER — IBUPROFEN 800 MG PO TABS: 800.0000 mg | ORAL_TABLET | Freq: Two times a day (BID) | ORAL | 0 refills | Status: DC | PRN

## 2023-09-30 NOTE — Progress Notes (Signed)
Subjective:  Sydney Williams is a 57 y.o. female who presents for Chief Complaint  Patient presents with   Back Pain    Mid back/low back pain B/L x 3 days. Seen 09/17/23 got better and then started up again.      Here for back pain.  I saw her recently 09/17/2023 for low back pain and hip pain on the right but that seemed to resolve.  She has some new pain for the last few days.  The pain is in the mid left back.  She denies any injury or trauma but has been doing some cooking in the kitchen preparing for Thanksgiving.  No rash.  But the pain feels like it is more on the inside.  No fever.  No belly pain.  No change in bowels.  No fall or trauma.  Is having some urinary frequency, but no burning with uraintion, no odor in urine, no cloudy urine.  Urine is darker.   No other aggravating or relieving factors.    No other c/o.  Past Medical History:  Diagnosis Date   Asthma    2015 hospitaliation for asthma   Diabetes mellitus    Type II   Eczema 05/16/2022   Family history of breast cancer    Fibroids 2010   Genital herpes    Genital herpes 11/30/2014   GERD (gastroesophageal reflux disease)    Hyperlipidemia    Hypertension    Impaired fasting blood sugar    Migraines    Obesity    Right carpal tunnel syndrome    Smoker    Current Outpatient Medications on File Prior to Visit  Medication Sig Dispense Refill   acyclovir (ZOVIRAX) 200 MG capsule Take 1 capsule (200 mg total) by mouth 2 (two) times daily. 60 capsule 2   allopurinol (ZYLOPRIM) 100 MG tablet Take 1 tablet (100 mg total) by mouth daily. 90 tablet 0   amLODipine (NORVASC) 10 MG tablet Take 1 tablet (10 mg total) by mouth daily to lower blood pressure. 90 tablet 2   ascorbic acid (VITAMIN C) 500 MG tablet Take 500 mg by mouth daily.     aspirin (EQ ASPIRIN ADULT LOW DOSE) 81 MG EC tablet Take 1 tablet (81 mg total) by mouth daily. Swallow whole. 90 tablet 3   atorvastatin (LIPITOR) 40 MG tablet Take 1 tablet (40 mg  total) by mouth daily. 90 tablet 2   chlorthalidone (HYGROTON) 25 MG tablet Take 0.5 tablets (12.5 mg total) by mouth daily. 45 tablet 2   ferrous sulfate 325 (65 FE) MG tablet Take 325 mg by mouth daily with breakfast.     Fluticasone-Umeclidin-Vilant 200-62.5-25 MCG/ACT AEPB Inhale 1 puff into the lungs daily. Rinse mouth after each use. 60 each 4   Insulin Pen Needle (B-D ULTRAFINE III SHORT PEN) 31G X 8 MM MISC Use as needed. 100 each 2   metFORMIN (GLUCOPHAGE) 500 MG tablet Take 2 tablets (1,000 mg total) by mouth 2 (two) times daily with a meal. 360 tablet 1   metoprolol tartrate (LOPRESSOR) 50 MG tablet Take 1 tablet (50 mg total) by mouth 2 (two) times daily. 180 tablet 0   Semaglutide, 1 MG/DOSE, 4 MG/3ML SOPN Inject 1 mg as directed once a week. 3 mL 2   albuterol (VENTOLIN HFA) 108 (90 Base) MCG/ACT inhaler Inhale 1-2 puffs into the lungs every 6 (six) hours as needed for wheezing or shortness of breath. (Patient not taking: Reported on 09/30/2023) 18 g 0  colchicine 0.6 MG tablet Take 1 tablet (0.6 mg total) by mouth daily as needed (gout flare). (Patient not taking: Reported on 09/30/2023) 30 tablet 2   nystatin (MYCOSTATIN/NYSTOP) powder Apply 1 Application topically 2 (two) times daily. (Patient not taking: Reported on 09/30/2023) 60 g 0   No current facility-administered medications on file prior to visit.     The following portions of the patient's history were reviewed and updated as appropriate: allergies, current medications, past family history, past medical history, past social history, past surgical history and problem list.  ROS Otherwise as in subjective above    Objective: BP 128/78   Pulse 92   Temp 98.3 F (36.8 C) (Tympanic)   Ht 5\' 9"  (1.753 m)   Wt 253 lb 9.6 oz (115 kg)   LMP 06/29/2015 (Exact Date)   BMI 37.45 kg/m   General appearance: alert, no distress, well developed, well nourished Somewhat tender in the left mid back in the T7/T8 region.  No  obvious rash bruising discoloration on the surface.  No other tenderness of the back.  She has some pain with rotational motion of the back but otherwise no pain with range of motion of arms or legs. Abdomen: +bs, soft, non tender, non distended, no masses, no hepatomegaly, no splenomegaly Pulses: 2+ radial pulses, 2+ pedal pulses, normal cap refill Ext: no edema   Assessment: Encounter Diagnoses  Name Primary?   Other acute back pain Yes   Abnormal urinalysis    Mid back pain      Plan: We discussed symptoms, possible differential.  Symptoms are somewhat vague at this point.  We discussed differential could be as simple as acute back pain/musculoskeletal pain or could be urinary tract infection, early shingles symptoms, tumor or other.  Offered baseline x-ray.  She wants to try some treatment first  Urinalysis somewhat abnormal.  Urine culture sent.  I asked her to repeat urinalysis clean-catch in 2 weeks morning sample.  Advised if any new symptoms or worse symptoms in the next few days to call or recheck particularly of rash or worse urinary symptoms.  Of note from her recent visit for her lower back pain she did not tolerate Percocet and has not tolerated hydrocodone in the past.  Updated allergy list today.  Medication as below, relative rest and stretching advised  Aleshea was seen today for back pain.  Diagnoses and all orders for this visit:  Other acute back pain -     POCT Urinalysis DIP (Proadvantage Device) -     Urine Culture  Abnormal urinalysis -     Urine Culture -     POCT Urinalysis DIP (Proadvantage Device); Future  Mid back pain  Other orders -     ibuprofen (ADVIL) 800 MG tablet; Take 1 tablet (800 mg total) by mouth 2 (two) times daily as needed.    Follow up: 2 weeks for clean-catch urinalysis

## 2023-10-02 LAB — URINE CULTURE: Organism ID, Bacteria: NO GROWTH

## 2023-10-02 NOTE — Progress Notes (Signed)
Urine culture shows no infection.  Get an idea of current symptoms today?  Still need to repeat a clean-catch urine sample in a few weeks/ morning sample

## 2023-10-06 ENCOUNTER — Telehealth: Payer: Self-pay | Admitting: Medical

## 2023-10-06 NOTE — Telephone Encounter (Signed)
Pt called you back but you were with a patient, I advised you would try to call again when available. Provided call back number [phone]

## 2023-10-16 ENCOUNTER — Other Ambulatory Visit: Payer: Self-pay | Admitting: Medical

## 2023-10-16 ENCOUNTER — Telehealth: Payer: Self-pay | Admitting: Medical

## 2023-10-16 MED ORDER — TIZANIDINE HCL 4 MG PO TABS: 4.0000 mg | ORAL_TABLET | Freq: Every day | ORAL | 0 refills | Status: DC | PRN

## 2023-10-16 NOTE — Telephone Encounter (Signed)
Pt called requesting refill on zanaflex  [address]

## 2023-10-27 ENCOUNTER — Telehealth: Payer: Self-pay | Admitting: Medical

## 2023-10-27 ENCOUNTER — Other Ambulatory Visit (INDEPENDENT_AMBULATORY_CARE_PROVIDER_SITE_OTHER): Payer: 59

## 2023-10-27 DIAGNOSIS — R8290 Unspecified abnormal findings in urine: Secondary | ICD-10-CM

## 2023-10-27 DIAGNOSIS — E119 Type 2 diabetes mellitus without complications: Secondary | ICD-10-CM

## 2023-10-27 LAB — POCT URINALYSIS DIP (PROADVANTAGE DEVICE)
Bilirubin, UA: NEGATIVE
Blood, UA: NEGATIVE
Glucose, UA: NEGATIVE mg/dL
Ketones, POC UA: NEGATIVE mg/dL
Leukocytes, UA: NEGATIVE
Nitrite, UA: NEGATIVE
Protein Ur, POC: NEGATIVE mg/dL
Specific Gravity, Urine: 1.01
Urobilinogen, Ur: 0.2
pH, UA: 6 (ref 5.0–8.0)

## 2023-10-27 MED ORDER — ATORVASTATIN CALCIUM 40 MG PO TABS: 40.0000 mg | ORAL_TABLET | Freq: Every day | ORAL | 2 refills | Status: DC

## 2023-10-27 MED ORDER — METFORMIN HCL 500 MG PO TABS: 1000.0000 mg | ORAL_TABLET | Freq: Two times a day (BID) | ORAL | 1 refills | Status: DC

## 2023-10-27 MED ORDER — CHLORTHALIDONE 25 MG PO TABS: 12.5000 mg | ORAL_TABLET | Freq: Every day | ORAL | 2 refills | Status: DC

## 2023-10-27 NOTE — Telephone Encounter (Signed)
Pt is requesting a refill on her metformin , Lipitor [place] Please send to the [facility] on [address]

## 2023-10-27 NOTE — Progress Notes (Signed)
Thankfully your urine sample was normal or negative

## 2023-10-27 NOTE — Telephone Encounter (Signed)
refilled 

## 2023-10-31 ENCOUNTER — Telehealth: Payer: Self-pay

## 2023-10-31 NOTE — Telephone Encounter (Signed)
 Pharmacy Patient Advocate Encounter   Received notification from CoverMyMeds that prior authorization for OZEMPIC  is required/requested.   Insurance verification completed.   The patient is insured through [facility] .   Per test claim: PA required; PA submitted to above mentioned insurance via CoverMyMeds Key/confirmation #/EOC [id number] Status is pending

## 2023-11-03 NOTE — Telephone Encounter (Signed)
 Pharmacy Patient Advocate Encounter  Received notification from [facility] that Prior Authorization for OZEMPIC  has been APPROVED from 10/31/2023 to 10/27/2024   PA #/Case ID/Reference #: [id number]  [facility] has been informed, refill not due until 11/15/2023

## 2023-11-06 ENCOUNTER — Other Ambulatory Visit: Payer: Self-pay

## 2023-11-06 ENCOUNTER — Encounter: Payer: Self-pay | Admitting: Family Medicine

## 2023-11-06 ENCOUNTER — Other Ambulatory Visit (INDEPENDENT_AMBULATORY_CARE_PROVIDER_SITE_OTHER): Payer: 59

## 2023-11-06 ENCOUNTER — Ambulatory Visit
Admission: RE | Admit: 2023-11-06 | Discharge: 2023-11-06 | Disposition: A | Discharge status: Home / Self Care | Payer: 59 | Source: Ambulatory Visit | Attending: Medical | Admitting: Medical

## 2023-11-06 ENCOUNTER — Telehealth (INDEPENDENT_AMBULATORY_CARE_PROVIDER_SITE_OTHER): Payer: 59 | Admitting: Medical

## 2023-11-06 ENCOUNTER — Other Ambulatory Visit: Payer: Self-pay | Admitting: Medical

## 2023-11-06 VITALS — HR 98

## 2023-11-06 VITALS — Temp 102.0°F | Ht 69.0 in | Wt 243.0 lb

## 2023-11-06 DIAGNOSIS — R509 Fever, unspecified: Secondary | ICD-10-CM | POA: Diagnosis not present

## 2023-11-06 DIAGNOSIS — R52 Pain, unspecified: Secondary | ICD-10-CM

## 2023-11-06 DIAGNOSIS — R6889 Other general symptoms and signs: Secondary | ICD-10-CM

## 2023-11-06 DIAGNOSIS — R051 Acute cough: Secondary | ICD-10-CM | POA: Diagnosis not present

## 2023-11-06 DIAGNOSIS — R059 Cough, unspecified: Secondary | ICD-10-CM | POA: Diagnosis not present

## 2023-11-06 DIAGNOSIS — J4540 Moderate persistent asthma, uncomplicated: Secondary | ICD-10-CM | POA: Diagnosis not present

## 2023-11-06 LAB — POCT INFLUENZA A/B
Influenza A, POC: NEGATIVE
Influenza B, POC: NEGATIVE

## 2023-11-06 LAB — POCT RESPIRATORY SYNCYTIAL VIRUS: RSV Rapid Ag: NEGATIVE

## 2023-11-06 LAB — POC COVID19 BINAXNOW: SARS Coronavirus 2 Ag: NEGATIVE

## 2023-11-06 MED ORDER — OSELTAMIVIR PHOSPHATE 75 MG PO CAPS: 75.0000 mg | ORAL_CAPSULE | Freq: Two times a day (BID) | ORAL | 0 refills | Status: DC

## 2023-11-06 MED ORDER — ALBUTEROL SULFATE HFA 108 (90 BASE) MCG/ACT IN AERS: 2.0000 | INHALATION_SPRAY | Freq: Four times a day (QID) | RESPIRATORY_TRACT | 1 refills | Status: AC | PRN

## 2023-11-06 MED ORDER — ALBUTEROL SULFATE (2.5 MG/3ML) 0.083% IN NEBU: 2.5000 mg | INHALATION_SOLUTION | Freq: Four times a day (QID) | RESPIRATORY_TRACT | 2 refills | Status: AC | PRN

## 2023-11-06 MED ORDER — PROMETHAZINE-DM 6.25-15 MG/5ML PO SYRP
5.0000 mL | ORAL_SOLUTION | Freq: Four times a day (QID) | ORAL | 0 refills | Status: DC | PRN
  Filled 2023-11-06: qty 120, 6d supply, fill #0

## 2023-11-06 MED ORDER — PREDNISONE 20 MG PO TABS: ORAL_TABLET | ORAL | 0 refills | Status: DC

## 2023-11-06 NOTE — Progress Notes (Signed)
 Thankfully chest x-ray does not show pneumonia.  Given your symptoms I recommend starting Tamiflu .  Sometimes you can get a false negative result early on in illness, so I still suspect influenza.  Her illness sounds like flu and given the negative x-ray lets treat for possible fluid.  I sent Tamiflu  medication to the pharmacy to take twice a day for 5 days  Hydrate well throughout the day with water and soup broth and other fluids such as Pedialyte  Cut back on the chlorthalidone  and hold off on this for couple days to stay hydrated  Use the albuterol  either nebulizer or inhaler every 4-6 hours as needed  If not improving in the next 3 days at least some, or if worse including extreme fatigue, difficulty breathing or extreme weakness then go to the emergency department

## 2023-11-06 NOTE — Progress Notes (Signed)
 Subjective:     Patient ID: Sydney Williams, female   DOB: [birth year], 58 y.o.   MRN: [id number]  This visit type was conducted due to national recommendations for restrictions regarding the COVID-19 Pandemic (e.g. social distancing) in an effort to limit this patient's exposure and mitigate transmission in our community.  Due to their co-morbid illnesses, this patient is at least at moderate risk for complications without adequate follow up.  This format is felt to be most appropriate for this patient at this time.    Documentation for virtual audio and video telecommunications through [place] encounter:  The patient was located at home. The provider was located in the office. The patient did consent to this visit and is aware of possible charges through their insurance for this visit.  The other persons participating in this telemedicine service were none. Time spent on call was 20 minutes and in review of previous records 20 minutes total.  This virtual service is not related to other E/M service within previous 7 days.   HPI Chief Complaint  Patient presents with   Cough    VIRTUAL cough, wheezing, chills and body aches that started yesterday. No home covid tests.    Virtual consult for 2 day hx/o illness.  She notes feeling horrible, started yesterday.  She notes feeling achy, head hurts, coughing bad, fever, chills.   Has sore throat.  Ears popping.  Water intake is good.  Doing soup.  Using diabetes cough medication OTC, tylenol .  No recent sick contacts this past week.  Using inhaler, albuterol  and maintenance inhaler.    Needs liquid inhaler for nebulizer.   No blood tinged mucous.  Hasn't done covid/flu test at home and doesn't have pulse oximeter.  No other aggravating or relieving factors. No other complaint.   Past Medical History:  Diagnosis Date   Asthma    2015 hospitaliation for asthma   Diabetes mellitus    Type II   Eczema 05/16/2022   Family history of breast  cancer    Fibroids 2010   Genital herpes    Genital herpes 11/30/2014   GERD (gastroesophageal reflux disease)    Hyperlipidemia    Hypertension    Impaired fasting blood sugar    Migraines    Obesity    Right carpal tunnel syndrome    Smoker    Current Outpatient Medications on File Prior to Visit  Medication Sig Dispense Refill   acetaminophen  (TYLENOL ) 325 MG tablet Take 650 mg by mouth every 6 (six) hours as needed.     acyclovir  (ZOVIRAX ) 200 MG capsule Take 1 capsule (200 mg total) by mouth 2 (two) times daily. 60 capsule 2   allopurinol  (ZYLOPRIM ) 100 MG tablet Take 1 tablet (100 mg total) by mouth daily. 90 tablet 0   amLODipine  (NORVASC ) 10 MG tablet Take 1 tablet (10 mg total) by mouth daily to lower blood pressure. 90 tablet 2   ascorbic acid  (VITAMIN C) 500 MG tablet Take 500 mg by mouth daily.     aspirin  (EQ ASPIRIN  ADULT LOW DOSE) 81 MG EC tablet Take 1 tablet (81 mg total) by mouth daily. Swallow whole. 90 tablet 3   atorvastatin  (LIPITOR) 40 MG tablet Take 1 tablet (40 mg total) by mouth daily. 90 tablet 2   chlorthalidone  (HYGROTON ) 25 MG tablet Take 0.5 tablets (12.5 mg total) by mouth daily. 45 tablet 2   dextromethorphan-guaiFENesin  (ROBITUSSIN-DM) 10-100 MG/5ML liquid Take 10 mLs by mouth every 4 (four) hours as  needed for cough.     ferrous sulfate  325 (65 FE) MG tablet Take 325 mg by mouth daily with breakfast.     Fluticasone -Umeclidin-Vilant 200-62.5-25 MCG/ACT AEPB Inhale 1 puff into the lungs daily. Rinse mouth after each use. 60 each 4   Insulin  Pen Needle (B-D ULTRAFINE III SHORT PEN) 31G X 8 MM MISC Use as needed. 100 each 2   metFORMIN  (GLUCOPHAGE ) 500 MG tablet Take 2 tablets (1,000 mg total) by mouth 2 (two) times daily with a meal. 360 tablet 1   metoprolol  tartrate (LOPRESSOR ) 50 MG tablet Take 1 tablet (50 mg total) by mouth 2 (two) times daily. 180 tablet 0   Semaglutide , 1 MG/DOSE, 4 MG/3ML SOPN Inject 1 mg as directed once a week. 3 mL 2    colchicine  0.6 MG tablet Take 1 tablet (0.6 mg total) by mouth daily as needed (gout flare). (Patient not taking: Reported on 11/06/2023) 30 tablet 2   ibuprofen  (ADVIL ) 800 MG tablet Take 1 tablet (800 mg total) by mouth 2 (two) times daily as needed. (Patient not taking: Reported on 11/06/2023) 30 tablet 0   nystatin  (MYCOSTATIN /NYSTOP ) powder Apply 1 Application topically 2 (two) times daily. (Patient not taking: Reported on 11/06/2023) 60 g 0   No current facility-administered medications on file prior to visit.    Review of Systems As in subjective      Objective:   Physical Exam Due to coronavirus pandemic stay at home measures, patient visit was virtual and they were not examined in person.   Temp (!) 102 F (38.9 C) (Temporal)   Ht 5' 9 (1.753 m)   Wt 243 lb (110.2 kg)   LMP 06/29/2015 (Exact Date)   BMI 35.88 kg/m   Gen: wd, wn, nad, ill appearing Somewhat dyspneic, but able to answer complete sentences      Assessment:     Encounter Diagnoses  Name Primary?   Flu-like symptoms Yes   Body aches    Fever, unspecified fever cause    Acute cough    Moderate persistent asthma without complication        Plan:     Symptoms suggest flu.  Advised her to come to our back parking lot for testing for further evaluation today so we can decide on treatment recommendations.  She will come to our office now.  Advised to continue good hydration, Tylenol  over-the-counter every 4-6 hours as needed for fever not feeling well, rest, hold off on chlorthalidone  for couple days to avoid dehydration  We will test for flu, COVID and RSV.     She came in for testing.  Negative for covid, flu and RSV.  Will send for chest xray.  Addendum: Chest x-ray normal today.  We will treat empirically for flu and there is the possibility of early false negative test  Tamiflu  sent to the pharmacy. Continue with plan with rest, hydration, albuterol  every 4-6 hours as needed, and if much worse in  the next few days , then go to the emergency department   Sydney Williams was seen today for cough.  Diagnoses and all orders for this visit:  Flu-like symptoms -     DG Chest 2 View; Future  Body aches -     DG Chest 2 View; Future  Fever, unspecified fever cause -     DG Chest 2 View; Future  Acute cough -     DG Chest 2 View; Future  Moderate persistent asthma without complication -  albuterol  (VENTOLIN  HFA) 108 (90 Base) MCG/ACT inhaler; Inhale 2 puffs into the lungs every 6 (six) hours as needed for wheezing or shortness of breath. -     DG Chest 2 View; Future  Other orders -     albuterol  (PROVENTIL ) (2.5 MG/3ML) 0.083% nebulizer solution; Take 3 mLs (2.5 mg total) by nebulization every 6 (six) hours as needed for wheezing or shortness of breath. -     predniSONE  (DELTASONE ) 20 MG tablet; 3 tablets daily for 3 days, then 2 tablets daily for 3 days, then 1 tablet daily for 3 days, then 1/2 tablet daily for 3 days.    F/u prn

## 2023-11-06 NOTE — Progress Notes (Signed)
 Pt was notified.

## 2023-11-17 ENCOUNTER — Other Ambulatory Visit: Payer: Self-pay

## 2023-12-12 ENCOUNTER — Ambulatory Visit: Payer: 59 | Admitting: Medical

## 2023-12-12 VITALS — BP 122/80 | HR 93 | Temp 97.3°F | Wt 258.8 lb

## 2023-12-12 DIAGNOSIS — E782 Mixed hyperlipidemia: Secondary | ICD-10-CM

## 2023-12-12 DIAGNOSIS — M542 Cervicalgia: Secondary | ICD-10-CM

## 2023-12-12 DIAGNOSIS — M62838 Other muscle spasm: Secondary | ICD-10-CM

## 2023-12-12 DIAGNOSIS — F17200 Nicotine dependence, unspecified, uncomplicated: Secondary | ICD-10-CM

## 2023-12-12 DIAGNOSIS — Z136 Encounter for screening for cardiovascular disorders: Secondary | ICD-10-CM

## 2023-12-12 DIAGNOSIS — R21 Rash and other nonspecific skin eruption: Secondary | ICD-10-CM

## 2023-12-12 DIAGNOSIS — I700 Atherosclerosis of aorta: Secondary | ICD-10-CM | POA: Diagnosis not present

## 2023-12-12 DIAGNOSIS — L089 Local infection of the skin and subcutaneous tissue, unspecified: Secondary | ICD-10-CM

## 2023-12-12 DIAGNOSIS — E119 Type 2 diabetes mellitus without complications: Secondary | ICD-10-CM

## 2023-12-12 DIAGNOSIS — I10 Essential (primary) hypertension: Secondary | ICD-10-CM

## 2023-12-12 MED ORDER — CEPHALEXIN 500 MG PO CAPS: 500.0000 mg | ORAL_CAPSULE | Freq: Two times a day (BID) | ORAL | 0 refills | Status: DC

## 2023-12-12 MED ORDER — SEMAGLUTIDE (2 MG/DOSE) 8 MG/3ML ~~LOC~~ SOPN: 2.0000 mg | PEN_INJECTOR | SUBCUTANEOUS | 2 refills | Status: DC

## 2023-12-12 MED ORDER — MUPIROCIN 2 % EX OINT: 1.0000 | TOPICAL_OINTMENT | Freq: Two times a day (BID) | CUTANEOUS | 0 refills | Status: DC

## 2023-12-12 MED ORDER — TIZANIDINE HCL 4 MG PO TABS: 4.0000 mg | ORAL_TABLET | Freq: Two times a day (BID) | ORAL | 0 refills | Status: DC

## 2023-12-12 NOTE — Patient Instructions (Signed)
Recommendations:  Skin rash and tenderness on the right scalp Begin Keflex oral antibiotic twice a day for 1 week Begin mupirocin ointment topically twice a day for 1 week If not resolved or much improved within the next 7 days, let me know and we may need to switch to a steroid cream or something else The rash looks like a bacterial infection or folliculitis  Left face tenderness and spasm of face and neck Continue some over-the-counter ibuprofen for the next several days such as 200 mg ibuprofen over-the-counter, 3 tablets twice a day for the next 5 days Begin tizanidine muscle relaxer once or twice a day.  Caution as this can cause some drowsiness You can use heat, massage and stretching  We discussed heart disease and stroke screening I will initially place an order for a CT coronary heart disease screen as well as carotid ultrasound, abdominal aortic ultrasound to screen for aneurysm and blood flow issues going up into the neck Once the scheduler calls you, find out what your out-of-pocket cost will be.  If it is too expensive, some of these test can be done cash pay For example a CT coronary heart disease test can be done for $95 cash A company called [employer] [facility], look them up on [employer]; they do 3 test including carotid ultrasound, abdominal aortic aneurysm ultrasound and ABI blood flow studies in your legs for about $150 cash So it really comes down to whichever version is cheaper  I strongly recommend you try to quit smoking   Continue your current medications as usual

## 2023-12-12 NOTE — Progress Notes (Signed)
Subjective: Chief Complaint  Patient presents with   spots on heads    Spots on head- red spots and spasms in neck that will go to head and make it tender x 1 week   Here for some pains, spasms, rash and concerns.  Has had some bumps and tendnerss pop up in the past week on right scalp just above and in front of ear.  It is tender to touch.  No itching but is tender.  Has used some hair grease  For past 2 weeks when eating, feels like water on face like sweating, but no actual sweat or moisture.   This is on left face over jaw and in front of ear.  Has also had some spasm in left neck and left posterior scalp, pain in past 1 week.    Uses ibuprofen which helps some.  No numbness or tingling, but sharp pains in left neck and scalp.  Is curious about screening for stroke and heart.   Mom had aneurysm.    Has lost weight with ozempic but has started gaining some weight back.  No other aggravating or relieving factors. No other complaint.  Past Medical History:  Diagnosis Date   Asthma    2015 hospitaliation for asthma   Diabetes mellitus    Type II   Eczema 05/16/2022   Family history of breast cancer    Fibroids 2010   Genital herpes    Genital herpes 11/30/2014   GERD (gastroesophageal reflux disease)    Hyperlipidemia    Hypertension    Impaired fasting blood sugar    Migraines    Obesity    Right carpal tunnel syndrome    Smoker    Current Outpatient Medications on File Prior to Visit  Medication Sig Dispense Refill   acetaminophen (TYLENOL) 325 MG tablet Take 650 mg by mouth every 6 (six) hours as needed.     acyclovir (ZOVIRAX) 200 MG capsule Take 1 capsule (200 mg total) by mouth 2 (two) times daily. 60 capsule 2   albuterol (PROVENTIL) (2.5 MG/3ML) 0.083% nebulizer solution Take 3 mLs (2.5 mg total) by nebulization every 6 (six) hours as needed for wheezing or shortness of breath. 75 mL 2   albuterol (VENTOLIN HFA) 108 (90 Base) MCG/ACT inhaler Inhale 2 puffs into the  lungs every 6 (six) hours as needed for wheezing or shortness of breath. 18 g 1   allopurinol (ZYLOPRIM) 100 MG tablet Take 1 tablet (100 mg total) by mouth daily. 90 tablet 0   amLODipine (NORVASC) 10 MG tablet Take 1 tablet (10 mg total) by mouth daily to lower blood pressure. 90 tablet 2   ascorbic acid (VITAMIN C) 500 MG tablet Take 500 mg by mouth daily.     aspirin (EQ ASPIRIN ADULT LOW DOSE) 81 MG EC tablet Take 1 tablet (81 mg total) by mouth daily. Swallow whole. 90 tablet 3   atorvastatin (LIPITOR) 40 MG tablet Take 1 tablet (40 mg total) by mouth daily. 90 tablet 2   chlorthalidone (HYGROTON) 25 MG tablet Take 0.5 tablets (12.5 mg total) by mouth daily. 45 tablet 2   colchicine 0.6 MG tablet Take 1 tablet (0.6 mg total) by mouth daily as needed (gout flare). 30 tablet 2   ferrous sulfate 325 (65 FE) MG tablet Take 325 mg by mouth daily with breakfast.     Fluticasone-Umeclidin-Vilant 200-62.5-25 MCG/ACT AEPB Inhale 1 puff into the lungs daily. Rinse mouth after each use. 60 each 4  ibuprofen (ADVIL) 800 MG tablet Take 1 tablet (800 mg total) by mouth 2 (two) times daily as needed. 30 tablet 0   metFORMIN (GLUCOPHAGE) 500 MG tablet Take 2 tablets (1,000 mg total) by mouth 2 (two) times daily with a meal. 360 tablet 1   metoprolol tartrate (LOPRESSOR) 50 MG tablet Take 1 tablet (50 mg total) by mouth 2 (two) times daily. 180 tablet 0   nystatin (MYCOSTATIN/NYSTOP) powder Apply 1 Application topically 2 (two) times daily. 60 g 0   Semaglutide, 1 MG/DOSE, 4 MG/3ML SOPN Inject 1 mg as directed once a week. 3 mL 2   dextromethorphan-guaiFENesin (ROBITUSSIN-DM) 10-100 MG/5ML liquid Take 10 mLs by mouth every 4 (four) hours as needed for cough. (Patient not taking: Reported on 12/12/2023)     Insulin Pen Needle (B-D ULTRAFINE III SHORT PEN) 31G X 8 MM MISC Use as needed. 100 each 2   No current facility-administered medications on file prior to visit.   ROS as in subjective   Objective: BP  122/80   Pulse 93   Temp (!) 97.3 F (36.3 C)   Wt 258 lb 12.8 oz (117.4 kg)   LMP 06/29/2015 (Exact Date)   BMI 38.22 kg/m   Wt Readings from Last 3 Encounters:  12/12/23 258 lb 12.8 oz (117.4 kg)  11/06/23 243 lb (110.2 kg)  09/30/23 253 lb 9.6 oz (115 kg)    General appearence: alert, no distress, WD/WN,  Skin: right scalp parietal region anterior and superior to ear with two,  1cm diameter or less patches of erythema, some scaling and some raised papular lesions, but not vesicular. Neck: supple, but tender left neck anterior and lateral and posterolateral, pain with ROM which is 80% full, otherwise no lymphadenopathy, no thyromegaly, no masses, no bruits Heart: RRR, normal S1, S2, no murmurs Lungs: CTA bilaterally, no wheezes, rhonchi, or rales Abdomen: +bs, soft, non tender, non distended, no masses, no hepatomegaly, no splenomegaly, no bruits Pulses: 2+ symmetric, upper and 1+ lower extremities, normal cap refill    Assessment: Encounter Diagnoses  Name Primary?   Rash Yes   Skin infection    Neck pain    Neck muscle spasm    Smoker    Aortic atherosclerosis (HCC)    Essential hypertension    Mixed hyperlipidemia    Type 2 diabetes mellitus without complication, without long-term current use of insulin (HCC)    Screening for AAA (abdominal aortic aneurysm)    Screening for heart disease    Encounter for screening for vascular disease     Plan: Discussed her concerns, symptoms  Diabetes - increase Ozempic to 2mg  weekly.  We discussed her concerns for vascular screening  Recommendations:  Skin rash and tenderness on the right scalp Begin Keflex oral antibiotic twice a day for 1 week Begin mupirocin ointment topically twice a day for 1 week If not resolved or much improved within the next 7 days, let me know and we may need to switch to a steroid cream or something else The rash looks like a bacterial infection or folliculitis  Left face tenderness and  spasm of face and neck Continue some over-the-counter ibuprofen for the next several days such as 200 mg ibuprofen over-the-counter, 3 tablets twice a day for the next 5 days Begin tizanidine muscle relaxer once or twice a day.  Caution as this can cause some drowsiness You can use heat, massage and stretching  We discussed heart disease and stroke screening I will initially place  an order for a CT coronary heart disease screen as well as carotid ultrasound, abdominal aortic ultrasound to screen for aneurysm and blood flow issues going up into the neck Once the scheduler calls you, find out what your out-of-pocket cost will be.  If it is too expensive, some of these test can be done cash pay For example a CT coronary heart disease test can be done for $95 cash A company called [facility], look them up on [employer]; they do 3 test including carotid ultrasound, abdominal aortic aneurysm ultrasound and ABI blood flow studies in your legs for about $150 cash So it really comes down to whichever version is cheaper  I strongly recommend you try to quit smoking   Continue your current medications as usual     Sydney Williams was seen today for spots on heads.  Diagnoses and all orders for this visit:  Rash  Skin infection  Neck pain  Neck muscle spasm  Smoker  Aortic atherosclerosis (HCC) -     US Carotid Bilateral; Future -     VAS [place] AAA DUPLEX; Future -     CT CARDIAC SCORING ([facility] LOCATIONS ONLY); Future  Essential hypertension -     US Carotid Bilateral; Future -     VAS [place] AAA DUPLEX; Future -     CT CARDIAC SCORING ([facility] LOCATIONS ONLY); Future  Mixed hyperlipidemia -     US Carotid Bilateral; Future -     VAS [place] AAA DUPLEX; Future -     CT CARDIAC SCORING ([facility] LOCATIONS ONLY); Future  Type 2 diabetes mellitus without complication, without long-term current use of insulin (HCC) -     US Carotid Bilateral; Future -     VAS [place] AAA DUPLEX; Future -     CT CARDIAC SCORING ([facility]  LOCATIONS ONLY); Future  Screening for AAA (abdominal aortic aneurysm) -     VAS [place] AAA DUPLEX; Future  Screening for heart disease -     CT CARDIAC SCORING ([facility] LOCATIONS ONLY); Future  Encounter for screening for vascular disease -     US Carotid Bilateral; Future  Other orders -     cephALEXin (KEFLEX) 500 MG capsule; Take 1 capsule (500 mg total) by mouth 2 (two) times daily. -     mupirocin ointment (BACTROBAN) 2 %; Apply 1 Application topically 2 (two) times daily. -     tiZANidine (ZANAFLEX) 4 MG tablet; Take 1 tablet (4 mg total) by mouth in the morning and at bedtime. -     Semaglutide, 2 MG/DOSE, 8 MG/3ML SOPN; Inject 2 mg as directed once a week.    F/u  with call back in 10-14 days

## 2023-12-14 ENCOUNTER — Other Ambulatory Visit: Payer: Self-pay | Admitting: Medical

## 2023-12-15 NOTE — Telephone Encounter (Signed)
 Dose increased.

## 2023-12-19 ENCOUNTER — Other Ambulatory Visit: Payer: Self-pay | Admitting: Medical

## 2023-12-19 ENCOUNTER — Telehealth: Payer: Self-pay | Admitting: Medical

## 2023-12-19 ENCOUNTER — Ambulatory Visit
Admission: RE | Admit: 2023-12-19 | Discharge: 2023-12-19 | Disposition: A | Discharge status: Home / Self Care | Payer: 59 | Source: Ambulatory Visit | Attending: Medical | Admitting: Medical

## 2023-12-19 ENCOUNTER — Ambulatory Visit (HOSPITAL_COMMUNITY)
Admission: RE | Admit: 2023-12-19 | Discharge: 2023-12-19 | Disposition: A | Discharge status: Home / Self Care | Payer: 59 | Source: Ambulatory Visit | Attending: Medical | Admitting: Medical

## 2023-12-19 DIAGNOSIS — E119 Type 2 diabetes mellitus without complications: Secondary | ICD-10-CM | POA: Insufficient documentation

## 2023-12-19 DIAGNOSIS — I700 Atherosclerosis of aorta: Secondary | ICD-10-CM

## 2023-12-19 DIAGNOSIS — Z136 Encounter for screening for cardiovascular disorders: Secondary | ICD-10-CM

## 2023-12-19 DIAGNOSIS — I10 Essential (primary) hypertension: Secondary | ICD-10-CM

## 2023-12-19 DIAGNOSIS — E782 Mixed hyperlipidemia: Secondary | ICD-10-CM

## 2023-12-19 DIAGNOSIS — M109 Gout, unspecified: Secondary | ICD-10-CM

## 2023-12-19 MED ORDER — COLCHICINE 0.6 MG PO TABS: 0.6000 mg | ORAL_TABLET | Freq: Every day | ORAL | 2 refills | Status: DC | PRN

## 2023-12-19 MED ORDER — AMLODIPINE BESYLATE 10 MG PO TABS: 10.0000 mg | ORAL_TABLET | Freq: Every day | ORAL | 2 refills | Status: DC

## 2023-12-19 NOTE — Telephone Encounter (Signed)
Pt is asking if you will refill her amlodipine and colchine. I seen where another provider filled it last time but she sys you all discussed this. She prefers [id number] - [place], [place] - [address]

## 2023-12-19 NOTE — Progress Notes (Signed)
Your Abdominal aortic aneurysm study was normal thankfully

## 2023-12-22 NOTE — Progress Notes (Signed)
 Your ultrasound of your carotids shows no significant cholesterol buildup in the carotids thankfully.  Interesting the technologist did mention an irregular heartbeat.  If you are feeling any palpitations a weird beats with your heart then recheck.   I do not recall you having irregular to be on your last visit.

## 2023-12-26 ENCOUNTER — Other Ambulatory Visit: Payer: Self-pay | Admitting: Medical

## 2024-01-08 ENCOUNTER — Other Ambulatory Visit: Payer: Self-pay | Admitting: Medical

## 2024-01-08 DIAGNOSIS — I10 Essential (primary) hypertension: Secondary | ICD-10-CM

## 2024-01-09 ENCOUNTER — Other Ambulatory Visit: Payer: 59

## 2024-01-14 ENCOUNTER — Other Ambulatory Visit: Payer: Self-pay | Admitting: Medical

## 2024-01-27 ENCOUNTER — Telehealth: Payer: Self-pay | Admitting: *Deleted

## 2024-01-27 NOTE — Telephone Encounter (Signed)
 Called and left VM for pt regarding scheduling lung cancer screening ( referral was originally sent by Fonnie Mu through [employer] [facility]).

## 2024-02-16 ENCOUNTER — Other Ambulatory Visit: Payer: Self-pay | Admitting: Medical

## 2024-02-21 ENCOUNTER — Other Ambulatory Visit: Payer: Self-pay | Admitting: Medical

## 2024-02-23 NOTE — Telephone Encounter (Signed)
 Is this okay to refill?

## 2024-02-28 ENCOUNTER — Other Ambulatory Visit: Payer: Self-pay | Admitting: Medical

## 2024-03-16 ENCOUNTER — Other Ambulatory Visit: Payer: Self-pay | Admitting: Medical

## 2024-03-16 NOTE — Telephone Encounter (Signed)
 Last apt. 12/12/23.

## 2024-03-23 ENCOUNTER — Encounter: Payer: Self-pay | Admitting: *Deleted

## 2024-04-07 ENCOUNTER — Other Ambulatory Visit: Payer: Self-pay | Admitting: Medical

## 2024-04-07 NOTE — Telephone Encounter (Signed)
Pt has an appt in July.  

## 2024-04-15 ENCOUNTER — Other Ambulatory Visit: Payer: Self-pay | Admitting: Medical

## 2024-04-15 NOTE — Telephone Encounter (Signed)
Left message asking if patient needs this refilled.

## 2024-04-19 ENCOUNTER — Other Ambulatory Visit: Payer: Self-pay | Admitting: Medical

## 2024-04-19 MED ORDER — ACYCLOVIR 200 MG PO CAPS: 200.0000 mg | ORAL_CAPSULE | Freq: Two times a day (BID) | ORAL | 0 refills | Status: DC

## 2024-04-22 ENCOUNTER — Ambulatory Visit: Payer: Self-pay | Admitting: *Deleted

## 2024-04-22 NOTE — Telephone Encounter (Signed)
 FYI Only or Action Required?: Action required by provider: request for appointment.  Patient was last seen in primary care on 12/12/2023 by Bulah Alm RAMAN, PA-C. Called Nurse Triage reporting Abdominal Pain. Symptoms began a week ago. Interventions attempted: OTC medications: OTC Prilosec. Symptoms are: unchanged.  Triage Disposition: See HCP Within 4 Hours (Or PCP Triage)  Patient/caregiver understands and will follow disposition?: No open appointment- patient works and needs appointment at 2:00 or later   Reason for Disposition  [1] MILD-MODERATE pain AND [2] not relieved by antacid medicine  Answer Assessment - Initial Assessment Questions 1. LOCATION: Where does it hurt?      midline 2. RADIATION: Does the pain shoot anywhere else? (e.g., chest, back)     No radiation 3. ONSET: When did the pain begin? (e.g., minutes, hours or days ago)      1-2 weeks 4. SUDDEN: Gradual or sudden onset?     Every time she eats 5. PATTERN Does the pain come and go, or is it constant?    - If it comes and goes: How long does it last? Do you have pain now?     (Note: Comes and goes means the pain is intermittent. It goes away completely between bouts.)    - If constant: Is it getting better, staying the same, or getting worse?      (Note: Constant means the pain never goes away completely; most serious pain is constant and gets worse.)      Comes and goes, pain lasts , starts in am with meal- continues throughout the day- at night eases when stopped eating 6. SEVERITY: How bad is the pain?  (e.g., Scale 1-10; mild, moderate, or severe)    - MILD (1-3): Doesn't interfere with normal activities, abdomen soft and not tender to touch..     - MODERATE (4-7): Interferes with normal activities or awakens from sleep, abdomen tender to touch.     - SEVERE (8-10): Excruciating pain, doubled over, unable to do any normal activities.       7/10 7. RECURRENT SYMPTOM: Have you ever had this  type of stomach pain before? If Yes, ask: When was the last time? and What happened that time?      No-- patient has hx- acid reflux, patient has been using OTC without help- Prilosec  8. AGGRAVATING FACTORS: Does anything seem to cause this pain? (e.g., foods, stress, alcohol)     food 9. CARDIAC SYMPTOMS: Do you have any of the following symptoms: chest pain, difficulty breathing, sweating, nausea?     no 10. OTHER SYMPTOMS: Do you have any other symptoms? (e.g., back pain, diarrhea, fever, urination pain, vomiting)       Increased urination  Protocols used: Abdominal Pain - Upper-A-AH   Copied from CRM [id number]. Topic: Clinical - Red Word Triage >> Apr 22, 2024  8:32 AM Powell HERO wrote: Red Word that prompted transfer to Nurse Triage: Issues with the stomach, it hurts every time she has been eating and drinking. Onset about a week ago, urinating frequently, blood sugar seems to be high and meter is broken.

## 2024-04-22 NOTE — Telephone Encounter (Signed)
 Called patient, left message to call the office to schedule appointment

## 2024-04-26 ENCOUNTER — Ambulatory Visit (INDEPENDENT_AMBULATORY_CARE_PROVIDER_SITE_OTHER): Admitting: Medical

## 2024-04-26 VITALS — BP 110/70 | HR 84 | Temp 97.3°F | Resp 99 | Ht 71.0 in | Wt 250.4 lb

## 2024-04-26 DIAGNOSIS — R1011 Right upper quadrant pain: Secondary | ICD-10-CM | POA: Diagnosis not present

## 2024-04-26 DIAGNOSIS — R110 Nausea: Secondary | ICD-10-CM | POA: Diagnosis not present

## 2024-04-26 DIAGNOSIS — E782 Mixed hyperlipidemia: Secondary | ICD-10-CM

## 2024-04-26 DIAGNOSIS — E119 Type 2 diabetes mellitus without complications: Secondary | ICD-10-CM

## 2024-04-26 DIAGNOSIS — B009 Herpesviral infection, unspecified: Secondary | ICD-10-CM

## 2024-04-26 DIAGNOSIS — Z6833 Body mass index (BMI) 33.0-33.9, adult: Secondary | ICD-10-CM

## 2024-04-26 DIAGNOSIS — E66811 Obesity, class 1: Secondary | ICD-10-CM

## 2024-04-26 DIAGNOSIS — I10 Essential (primary) hypertension: Secondary | ICD-10-CM | POA: Diagnosis not present

## 2024-04-26 NOTE — Progress Notes (Signed)
 Subjective:  Sydney Williams is a 58 y.o. female who presents for Chief Complaint  Patient presents with   Abdominal Pain    Mid- Stomach pain x 2 weeks. Hurts when eating and drinking. Nauseated some     Here for epigastric abdominal pain x 1-2 weeks.  Every time she drinks or eats it hurts.  Anything can make it worse, not just spicy foods.   Can be any time of day.  Usually after eating.   Typically pain occurs within minutes after eating.  Pain sits there.  Has nausea, no vomiting.   No bowel changes.  Has daily BM.   No fever.   Is urinating quite a bit.   Using tylenol  and ibuprofen .  Using OTC Pepcid .    Is compliant with metformin , iron, ozempic .  Compliant with rest of medication as usual .   Uses acyclovir  BID for herpes prevention.  Last outbreak a while ago, unsure.   No other aggravating or relieving factors.    No other c/o.   Past Medical History:  Diagnosis Date   Asthma    2015 hospitaliation for asthma   Diabetes mellitus    Type II   Eczema 05/16/2022   Family history of breast cancer    Fibroids 2010   Genital herpes    Genital herpes 11/30/2014   GERD (gastroesophageal reflux disease)    Hyperlipidemia    Hypertension    Impaired fasting blood sugar    Migraines    Obesity    Right carpal tunnel syndrome    Smoker    Current Outpatient Medications on File Prior to Visit  Medication Sig Dispense Refill   acetaminophen  (TYLENOL ) 325 MG tablet Take 650 mg by mouth every 6 (six) hours as needed.     acyclovir  (ZOVIRAX ) 200 MG capsule Take 1 capsule (200 mg total) by mouth 2 (two) times daily. 180 capsule 0   albuterol  (PROVENTIL ) (2.5 MG/3ML) 0.083% nebulizer solution Take 3 mLs (2.5 mg total) by nebulization every 6 (six) hours as needed for wheezing or shortness of breath. 75 mL 2   albuterol  (VENTOLIN  HFA) 108 (90 Base) MCG/ACT inhaler Inhale 2 puffs into the lungs every 6 (six) hours as needed for wheezing or shortness of breath. 18 g 1   allopurinol   (ZYLOPRIM ) 100 MG tablet Take 1 tablet by mouth once daily 90 tablet 1   amLODipine  (NORVASC ) 10 MG tablet Take 1 tablet (10 mg total) by mouth daily to lower blood pressure. 90 tablet 2   ascorbic acid  (VITAMIN C) 500 MG tablet Take 500 mg by mouth daily.     aspirin  (EQ ASPIRIN  ADULT LOW DOSE) 81 MG EC tablet Take 1 tablet (81 mg total) by mouth daily. Swallow whole. 90 tablet 3   atorvastatin  (LIPITOR) 40 MG tablet Take 1 tablet (40 mg total) by mouth daily. 90 tablet 2   chlorthalidone  (HYGROTON ) 25 MG tablet Take 0.5 tablets (12.5 mg total) by mouth daily. 45 tablet 2   colchicine  0.6 MG tablet Take 1 tablet (0.6 mg total) by mouth daily as needed (gout flare). 30 tablet 2   dextromethorphan-guaiFENesin  (ROBITUSSIN-DM) 10-100 MG/5ML liquid Take 10 mLs by mouth every 4 (four) hours as needed for cough.     ferrous sulfate  325 (65 FE) MG tablet Take 325 mg by mouth daily with breakfast.     Fluticasone -Umeclidin-Vilant 200-62.5-25 MCG/ACT AEPB Inhale 1 puff into the lungs daily. Rinse mouth after each use. 60 each 4   ibuprofen  (  ADVIL ) 800 MG tablet Take 1 tablet by mouth twice daily as needed 30 tablet 0   Insulin  Pen Needle (B-D ULTRAFINE III SHORT PEN) 31G X 8 MM MISC Use as needed. 100 each 2   metFORMIN  (GLUCOPHAGE ) 500 MG tablet Take 2 tablets (1,000 mg total) by mouth 2 (two) times daily with a meal. 360 tablet 1   metoprolol  tartrate (LOPRESSOR ) 50 MG tablet Take 1 tablet by mouth twice daily 180 tablet 0   Semaglutide , 2 MG/DOSE, (OZEMPIC , 2 MG/DOSE,) 8 MG/3ML SOPN INJECT 2MG  INTO THE SKIN ONCE A WEEK 3 mL 1   cephALEXin  (KEFLEX ) 500 MG capsule Take 1 capsule (500 mg total) by mouth 2 (two) times daily. 14 capsule 0   mupirocin  ointment (BACTROBAN ) 2 % Apply 1 Application topically 2 (two) times daily. 22 g 0   nystatin  (MYCOSTATIN /NYSTOP ) powder Apply 1 Application topically 2 (two) times daily. 60 g 0   Semaglutide , 1 MG/DOSE, 4 MG/3ML SOPN Inject 1 mg as directed once a week. 3 mL 2    tiZANidine  (ZANAFLEX ) 4 MG tablet Take 1 tablet (4 mg total) by mouth in the morning and at bedtime. 12 tablet 0   No current facility-administered medications on file prior to visit.   Past Surgical History:  Procedure Laterality Date   BACK SURGERY  10/24/2020   CARDIAC CATHETERIZATION     CARPAL TUNNEL RELEASE Right 08/30/2020   Procedure: RIGHT CARPAL TUNNEL RELEASE;  Surgeon: Jerri Kay HERO, MD;  Location: [facility];  Service: Orthopedics;  Laterality: Right;   COLONOSCOPY  03/13/2016   Dr. Avram, normal, repeat 2027   CYST EXCISION     left neck/postauricular region, benign   DILATION AND CURETTAGE OF UTERUS     LAPAROSCOPIC ABDOMINAL EXPLORATION     removal of ectopic preg   LEFT HEART CATH AND CORONARY ANGIOGRAPHY N/A 06/23/2017   Procedure: LEFT HEART CATH AND CORONARY ANGIOGRAPHY;  Surgeon: Mady Bruckner, MD;  Location: [facility];  Service: Cardiovascular;  Laterality: N/A;   TUBAL LIGATION       The following portions of the patient's history were reviewed and updated as appropriate: allergies, current medications, past family history, past medical history, past social history, past surgical history and problem list.  ROS Otherwise as in subjective above    Objective: BP 110/70   Pulse 84   Temp (!) 97.3 F (36.3 C)   Resp (!) 99   Ht 5' 11 (1.803 m)   Wt 250 lb 6.4 oz (113.6 kg)   LMP 06/29/2015 (Exact Date)   BMI 34.92 kg/m   Wt Readings from Last 3 Encounters:  04/26/24 250 lb 6.4 oz (113.6 kg)  12/12/23 258 lb 12.8 oz (117.4 kg)  11/06/23 243 lb (110.2 kg)    General appearance: alert, no distress, well developed, well nourished Heart: RRR, normal S1, S2, no murmurs Lungs: CTA bilaterally, no wheezes, rhonchi, or rales Abdomen: +bs, soft, RUQ and epigastric tenderness, otherwise non tender, non distended, no masses, no hepatomegaly, no splenomegaly Pulses: 2+ radial pulses, 2+ pedal pulses, normal cap refill Ext: no  edema    Assessment: Encounter Diagnoses  Name Primary?   Right upper quadrant abdominal pain Yes   Nausea    Herpes    Type 2 diabetes mellitus without complication, without long-term current use of insulin  (HCC)    Mixed hyperlipidemia    Class 1 obesity with serious comorbidity and body mass index (BMI) of 33.0 to 33.9 in adult,  unspecified obesity type    Essential hypertension      Plan: Discussed symptoms ,concerns, possible causes.  Labs and US  to eval for cholecystitis.    Causes of your symptoms could be gall bladder disease, acid reflux, indigestion, or could be related to your medication ozempic .  For now stop Metformin , stop Iron/Ferrous Sulfate   Don't take the ozempic  until we call back about labs  Expect a phone call about abdominal ultrasound scheduling  For now avoid heavy meals, greasy/fatty foods, high fat foods, or alcohol.    History of herpes - continue Acyclovir  for prevention   Sydney Williams was seen today for abdominal pain.  Diagnoses and all orders for this visit:  Right upper quadrant abdominal pain -     Comprehensive metabolic panel with GFR -     CBC with Differential/Platelet -     Lipase -     US  Abdomen Complete; Future  Nausea -     Comprehensive metabolic panel with GFR -     CBC with Differential/Platelet -     Lipase -     US  Abdomen Complete; Future  Herpes  Type 2 diabetes mellitus without complication, without long-term current use of insulin  (HCC) -     Hemoglobin A1c  Mixed hyperlipidemia  Class 1 obesity with serious comorbidity and body mass index (BMI) of 33.0 to 33.9 in adult, unspecified obesity type  Essential hypertension   Follow up: pending labs, US 

## 2024-04-26 NOTE — Patient Instructions (Signed)
  Causes of your symptoms could be gall bladder disease, acid reflux, indigestion, or could be related to your medication ozempic .  For now stop Metformin , stop Iron/Ferrous Sulfate   Don't take the ozempic  until we call back about labs  Expect a phone call about abdominal ultrasound scheduling  For now avoid heavy meals, greasy/fatty foods, high fat foods, or alcohol.    History of herpes - continue Acyclovir  for prevention

## 2024-04-27 ENCOUNTER — Other Ambulatory Visit: Payer: Self-pay | Admitting: Medical

## 2024-04-27 ENCOUNTER — Ambulatory Visit: Payer: Self-pay | Admitting: Medical

## 2024-04-27 LAB — COMPREHENSIVE METABOLIC PANEL WITH GFR
ALT: 42 IU/L — AB (ref 0–32)
AST: 38 IU/L (ref 0–40)
Albumin: 4.2 g/dL (ref 3.8–4.9)
Alkaline Phosphatase: 89 IU/L (ref 44–121)
BUN/Creatinine Ratio: 9 (ref 9–23)
BUN: 6 mg/dL (ref 6–24)
Bilirubin Total: 0.3 mg/dL (ref 0.0–1.2)
CO2: 26 mmol/L (ref 20–29)
Calcium: 9.5 mg/dL (ref 8.7–10.2)
Chloride: 96 mmol/L (ref 96–106)
Creatinine, Ser: 0.65 mg/dL (ref 0.57–1.00)
Globulin, Total: 2.4 g/dL (ref 1.5–4.5)
Glucose: 94 mg/dL (ref 70–99)
Potassium: 2.7 mmol/L — AB (ref 3.5–5.2)
Sodium: 140 mmol/L (ref 134–144)
Total Protein: 6.6 g/dL (ref 6.0–8.5)
eGFR: 101 mL/min/{1.73_m2} (ref >59)

## 2024-04-27 LAB — CBC WITH DIFFERENTIAL/PLATELET
Basophils Absolute: 0 10*3/uL (ref 0.0–0.2)
Basos: 1 %
EOS (ABSOLUTE): 0.1 10*3/uL (ref 0.0–0.4)
Eos: 2 %
Hematocrit: 44.5 % (ref 34.0–46.6)
Hemoglobin: 14.8 g/dL (ref 11.1–15.9)
Immature Grans (Abs): 0 10*3/uL (ref 0.0–0.1)
Immature Granulocytes: 0 %
Lymphocytes Absolute: 2.3 10*3/uL (ref 0.7–3.1)
Lymphs: 34 %
MCH: 33.3 pg — ABNORMAL HIGH (ref 26.6–33.0)
MCHC: 33.3 g/dL (ref 31.5–35.7)
MCV: 100 fL — ABNORMAL HIGH (ref 79–97)
Monocytes Absolute: 0.8 10*3/uL (ref 0.1–0.9)
Monocytes: 12 %
Neutrophils Absolute: 3.3 10*3/uL (ref 1.4–7.0)
Neutrophils: 51 %
Platelets: 249 10*3/uL (ref 150–450)
RBC: 4.45 x10E6/uL (ref 3.77–5.28)
RDW: 14.1 % (ref 11.7–15.4)
WBC: 6.6 10*3/uL (ref 3.4–10.8)

## 2024-04-27 LAB — HEMOGLOBIN A1C
Est. average glucose Bld gHb Est-mCnc: 123 mg/dL
Hgb A1c MFr Bld: 5.9 % — ABNORMAL HIGH (ref 4.8–5.6)

## 2024-04-27 LAB — LIPASE: Lipase: 45 U/L (ref 14–72)

## 2024-04-27 NOTE — Progress Notes (Signed)
 Thankfully diabetes marker stable at 5.9%.  1 liver test slightly elevated.  Potassium is low, kidney and electrolytes otherwise okay.  Blood count suggest possible vitamin deficiency.  Lipase pancreas marker okay.  For now stop chlorthalidone  blood pressure pill since you really cannot take potassium given prior reaction to potassium, because the chlorthalidone  blood pressure pill was lowering your potassium.  Stop metformin  but continue Ozempic   I recommend you take over-the-counter vitamin B12 supplement such as 500 mcg daily  Lets plan for ultrasound.  Expect a phone call to schedule  If your next Ozempic  injection is in the next few days then hold off on that until we get ultrasound results.  The low potassium may be causing some of your symptoms.

## 2024-04-28 ENCOUNTER — Inpatient Hospital Stay
Admission: RE | Admit: 2024-04-28 | Discharge: 2024-04-28 | Discharge status: Home / Self Care | Source: Ambulatory Visit | Attending: Medical | Admitting: Medical

## 2024-04-28 DIAGNOSIS — R110 Nausea: Secondary | ICD-10-CM

## 2024-04-28 DIAGNOSIS — K760 Fatty (change of) liver, not elsewhere classified: Secondary | ICD-10-CM | POA: Diagnosis not present

## 2024-04-28 DIAGNOSIS — R1011 Right upper quadrant pain: Secondary | ICD-10-CM

## 2024-04-28 DIAGNOSIS — K828 Other specified diseases of gallbladder: Secondary | ICD-10-CM | POA: Diagnosis not present

## 2024-04-28 NOTE — Progress Notes (Signed)
 Ultrasound shows sludge in the gallbladder but no obvious swollen gallbladder.  There is significant fatty liver though  Is she still having symptoms?  Are they more in the middle or upper right?  If symptoms are the same or worse we may need to make some temporary adjustments in medication or possibly proceed with something called a HIDA scan to further evaluate the gallbladder  Let me know

## 2024-04-29 ENCOUNTER — Other Ambulatory Visit: Payer: Self-pay | Admitting: Medical

## 2024-04-29 ENCOUNTER — Ambulatory Visit: Admitting: Family Medicine

## 2024-04-29 DIAGNOSIS — R1011 Right upper quadrant pain: Secondary | ICD-10-CM

## 2024-04-29 MED ORDER — OMEPRAZOLE 40 MG PO CPDR
40.0000 mg | DELAYED_RELEASE_CAPSULE | Freq: Two times a day (BID) | ORAL | 0 refills | Status: DC
Start: 2024-04-29 — End: 2024-07-13

## 2024-05-04 ENCOUNTER — Ambulatory Visit: Payer: Self-pay

## 2024-05-04 NOTE — Telephone Encounter (Signed)
 FYI Only or Action Required?: FYI only for provider.  Patient was last seen in primary care on 04/26/2024 by Bulah Alm RAMAN, PA-C.  Called Nurse Triage reporting Joint Swelling.  Symptoms began several days ago.  Interventions attempted: Rest, hydration, or home remedies.  Symptoms are: gradually worsening.  Triage Disposition: See PCP When Office is Open (Within 3 Days)  Patient/caregiver understands and will follow disposition?: Yes  Copied from CRM [id number]. Topic: Clinical - Red Word Triage >> May 04, 2024  4:38 PM [name] wrote: Red Word that prompted transfer to Nurse Triage: ankles swelling - took off bp medication because of stomach concerns thinks it was was also fluid pill. Reason for Disposition  MILD or MODERATE ankle swelling (e.g., can't move joint normally, can't do usual activities) (Exceptions: Itchy, localized swelling; swelling is chronic.)  Answer Assessment - Initial Assessment Questions 1. LOCATION: Which ankle is swollen? Where is the swelling?     both 2. ONSET: When did the swelling start?     3 days ago 3. SWELLING: How bad is the swelling? Or, How large is it? (e.g., mild, moderate, severe; size of localized swelling)    - NONE: No joint swelling.   - LOCALIZED: Localized; small area of puffy or swollen skin (e.g., insect bite, skin irritation).   - MILD: Joint looks or feels mildly swollen or puffy.   - MODERATE: Swollen; interferes with normal activities (e.g., work or school); decreased range of movement; may be limping.   - SEVERE: Very swollen; can't move swollen joint at all; limping a lot or unable to walk.     Moderate 4. PAIN: Is there any pain? If Yes, ask: How bad is it? (Scale 1-10; or mild, moderate, severe)   - NONE (0): no pain.   - MILD (1-3): doesn't interfere with normal activities.    - MODERATE (4-7): interferes with normal activities (e.g., work or school) or awakens from sleep, limping.    - SEVERE (8-10):  excruciating pain, unable to do any normal activities, unable to walk.      no 5. CAUSE: What do you think caused the ankle swelling?     Discontinued blood pressure  6. OTHER SYMPTOMS: Do you have any other symptoms? (e.g., fever, chest pain, difficulty breathing, calf pain)     Denies  Additional info: Patient was evaluated by pcp 04/26/24 for abdominal pain, she states her blood pressure/fluid pill was discontinued that day and wondering if this is causing her ankles to swell. Denies symptoms of hypertension.  Protocols used: Ankle Swelling-A-AH

## 2024-05-05 NOTE — Progress Notes (Unsigned)
 No chief complaint on file.  Patient presents with complaints of ankle swelling.  Chlorthalidone  was stopped on 7/1 by PCP due to hypokalemia (and prior reaction to potassium). She continues on amlodipine  and lopressor  for HTN.  BP's running  BP Readings from Last 3 Encounters:  04/26/24 110/70  12/12/23 122/80  09/30/23 128/78     She had been seen by PCP 6/30 with complaints of epigastric abdominal pain with eating. See below for lab eval and imaging studies. She was started on prilosec 40 mg and is awaiting HIDA scan, next week.    Chemistry      Component Value Date/Time   NA 140 04/26/2024 1359   K 2.7 (L) 04/26/2024 1359   CL 96 04/26/2024 1359   CO2 26 04/26/2024 1359   BUN 6 04/26/2024 1359   CREATININE 0.65 04/26/2024 1359   CREATININE 0.52 08/04/2020 0919      Component Value Date/Time   CALCIUM  9.5 04/26/2024 1359   ALKPHOS 89 04/26/2024 1359   AST 38 04/26/2024 1359   ALT 42 (H) 04/26/2024 1359   BILITOT 0.3 04/26/2024 1359     Lab Results  Component Value Date   WBC 6.6 04/26/2024   HGB 14.8 04/26/2024   HCT 44.5 04/26/2024   MCV 100 (H) 04/26/2024   PLT 249 04/26/2024   Lab Results  Component Value Date   HGBA1C 5.9 (H) 04/26/2024   Lab Results  Component Value Date   LIPASE 45 04/26/2024   Abdominal US : IMPRESSION: 1. Small volume biliary sludge and punctate gallstones. No changes of acute cholecystitis. 2. Diffuse hepatic steatosis.  She is scheduled for HIDA scan on 7/14.  Diabetes: On ozempic  2mg   Metformin ? *** Sugars?    PMH, PSH, SH reviewed  DM, HTN, HLD    ROS:    PHYSICAL EXAM:  LMP 06/29/2015 (Exact Date)   Wt Readings from Last 3 Encounters:  04/26/24 250 lb 6.4 oz (113.6 kg)  12/12/23 258 lb 12.8 oz (117.4 kg)  11/06/23 243 lb (110.2 kg)        ASSESSMENT/PLAN:

## 2024-05-06 ENCOUNTER — Ambulatory Visit (INDEPENDENT_AMBULATORY_CARE_PROVIDER_SITE_OTHER): Admitting: Family Medicine

## 2024-05-06 ENCOUNTER — Encounter: Payer: Self-pay | Admitting: Family Medicine

## 2024-05-06 VITALS — BP 124/78 | HR 87 | Wt 252.8 lb

## 2024-05-06 DIAGNOSIS — R600 Localized edema: Secondary | ICD-10-CM | POA: Diagnosis not present

## 2024-05-06 DIAGNOSIS — E876 Hypokalemia: Secondary | ICD-10-CM | POA: Diagnosis not present

## 2024-05-06 DIAGNOSIS — R1011 Right upper quadrant pain: Secondary | ICD-10-CM

## 2024-05-06 DIAGNOSIS — Z6835 Body mass index (BMI) 35.0-35.9, adult: Secondary | ICD-10-CM

## 2024-05-06 DIAGNOSIS — I10 Essential (primary) hypertension: Secondary | ICD-10-CM

## 2024-05-06 DIAGNOSIS — E119 Type 2 diabetes mellitus without complications: Secondary | ICD-10-CM | POA: Diagnosis not present

## 2024-05-06 NOTE — Telephone Encounter (Signed)
 Left message for pt to call back

## 2024-05-06 NOTE — Patient Instructions (Signed)
 Be sure to follow a low sodium diet, as salt in the diet can contribute to swelling the legs. Also try and elevate the legs (ideally above the level of your heart, ie in a recliner), as this helps reduce the swelling. Consider wearing compression socks, as this can cut back on the swelling as well--you put these on first thing in the morning, before you notice any swelling.  We are going to check your potassium today. Hopefully it looks a little better. If so, you likely can use the chlorthalidone  (1/2 tablet) just as needed when the swelling is bad, if the above measures aren't effectivel. Be sure to eat potassium-rich foods when you need to take the diuretic.  In general, we spoke about eating healthy--cutting back on processed foods (meats, chips, etc), eating more fruits and vegetables, avoiding fried foods.  Typically avoiding fried or greasy foods helps eliminate gall bladder issues (you may still have sludge or stones, but not have the discomfort if you avoid those foods).

## 2024-05-07 ENCOUNTER — Ambulatory Visit: Payer: Self-pay | Admitting: Family Medicine

## 2024-05-07 LAB — BASIC METABOLIC PANEL WITH GFR
BUN/Creatinine Ratio: 11 (ref 9–23)
BUN: 6 mg/dL (ref 6–24)
CO2: 26 mmol/L (ref 20–29)
Calcium: 9 mg/dL (ref 8.7–10.2)
Chloride: 99 mmol/L (ref 96–106)
Creatinine, Ser: 0.56 mg/dL — ABNORMAL LOW (ref 0.57–1.00)
Glucose: 151 mg/dL — ABNORMAL HIGH (ref 70–99)
Potassium: 3 mmol/L — ABNORMAL LOW (ref 3.5–5.2)
Sodium: 142 mmol/L (ref 134–144)
eGFR: 105 mL/min/1.73 (ref >59)

## 2024-05-07 NOTE — Telephone Encounter (Signed)
 Left message for pt to call me back

## 2024-05-07 NOTE — Telephone Encounter (Signed)
 Pt was seen yesterday.

## 2024-05-10 ENCOUNTER — Encounter (HOSPITAL_COMMUNITY)

## 2024-05-10 ENCOUNTER — Encounter (HOSPITAL_COMMUNITY): Payer: Self-pay

## 2024-05-10 ENCOUNTER — Other Ambulatory Visit: Payer: Self-pay | Admitting: Medical

## 2024-05-10 DIAGNOSIS — E876 Hypokalemia: Secondary | ICD-10-CM

## 2024-05-14 ENCOUNTER — Ambulatory Visit (INDEPENDENT_AMBULATORY_CARE_PROVIDER_SITE_OTHER): Admitting: Medical

## 2024-05-14 ENCOUNTER — Other Ambulatory Visit (HOSPITAL_COMMUNITY)

## 2024-05-14 ENCOUNTER — Encounter: Payer: Self-pay | Admitting: Medical

## 2024-05-14 ENCOUNTER — Other Ambulatory Visit: Payer: Self-pay | Admitting: Medical

## 2024-05-14 VITALS — BP 110/70 | HR 81 | Ht 71.0 in | Wt 253.2 lb

## 2024-05-14 DIAGNOSIS — I700 Atherosclerosis of aorta: Secondary | ICD-10-CM

## 2024-05-14 DIAGNOSIS — M5416 Radiculopathy, lumbar region: Secondary | ICD-10-CM | POA: Diagnosis not present

## 2024-05-14 DIAGNOSIS — E782 Mixed hyperlipidemia: Secondary | ICD-10-CM | POA: Diagnosis not present

## 2024-05-14 DIAGNOSIS — Z0000 Encounter for general adult medical examination without abnormal findings: Secondary | ICD-10-CM | POA: Diagnosis not present

## 2024-05-14 DIAGNOSIS — Z1389 Encounter for screening for other disorder: Secondary | ICD-10-CM

## 2024-05-14 DIAGNOSIS — Z7185 Encounter for immunization safety counseling: Secondary | ICD-10-CM | POA: Diagnosis not present

## 2024-05-14 DIAGNOSIS — E559 Vitamin D deficiency, unspecified: Secondary | ICD-10-CM | POA: Diagnosis not present

## 2024-05-14 DIAGNOSIS — I10 Essential (primary) hypertension: Secondary | ICD-10-CM | POA: Diagnosis not present

## 2024-05-14 DIAGNOSIS — L84 Corns and callosities: Secondary | ICD-10-CM

## 2024-05-14 DIAGNOSIS — E119 Type 2 diabetes mellitus without complications: Secondary | ICD-10-CM | POA: Diagnosis not present

## 2024-05-14 DIAGNOSIS — E790 Hyperuricemia without signs of inflammatory arthritis and tophaceous disease: Secondary | ICD-10-CM | POA: Diagnosis not present

## 2024-05-14 DIAGNOSIS — Z1231 Encounter for screening mammogram for malignant neoplasm of breast: Secondary | ICD-10-CM

## 2024-05-14 DIAGNOSIS — J4541 Moderate persistent asthma with (acute) exacerbation: Secondary | ICD-10-CM

## 2024-05-14 DIAGNOSIS — Z6835 Body mass index (BMI) 35.0-35.9, adult: Secondary | ICD-10-CM

## 2024-05-14 DIAGNOSIS — Z1211 Encounter for screening for malignant neoplasm of colon: Secondary | ICD-10-CM

## 2024-05-14 MED ORDER — SPIRONOLACTONE 25 MG PO TABS
25.0000 mg | ORAL_TABLET | Freq: Every day | ORAL | 3 refills | Status: AC
Start: 2024-05-14 — End: ?

## 2024-05-14 MED ORDER — OZEMPIC (1 MG/DOSE) 4 MG/3ML ~~LOC~~ SOPN: 1.0000 mg | PEN_INJECTOR | SUBCUTANEOUS | 0 refills | Status: DC

## 2024-05-14 NOTE — Progress Notes (Signed)
 Subjective:   HPI  Sydney Williams is a 58 y.o. female who presents for Chief Complaint  Patient presents with   Annual Exam    Nonfasting cpe, discuss potassium,  no concerns    Patient Care Team: Catheryne Deford, Alm RAMAN, PA-C as PCP - General (Family Medicine) [facility] Mohala, P.A. Dr. Lupita Commander, GI [facility], pap Dentist   Concerns: Here for well visit  Has been here recently for acute issues.  Hasn't started back ozempic , been off 2 weeks given recent abdominal pain and issues.  Stopped metformin  after last visit with me here recently  Stopped iron.  Was on chlorthalidone , but was advised recently to stop this at her visit.   However, she went back to using 1/2 tablet chlorthalidone  daily.  She had recently started this back when she had quite a bit of swelling after stopping this.    Corn of left foot - she is using corn removed.  It will go away but keeps coming back  Wants refill on ibuprofen  800mg  for back pain.  She uses this a few times per week   Reviewed their medical, surgical, family, social, medication, and allergy history and updated chart as appropriate.  Allergies  Allergen Reactions   Lisinopril  Swelling   Potassium Chloride  Shortness Of Breath and Swelling    Tolerates IV KCl, reaction only to PO product   Pneumococcal Vaccines Swelling and Other (See Comments)    Reaction:  Swelling at injection site   Vicodin [Hydrocodone-Acetaminophen ] Itching and Rash    Vicodin and percocet causes problems    Past Medical History:  Diagnosis Date   Asthma    2015 hospitaliation for asthma   Diabetes mellitus    Type II   Eczema 05/16/2022   Family history of breast cancer    Fibroids 2010   Genital herpes 11/30/2014   GERD (gastroesophageal reflux disease)    Hyperlipidemia    Hypertension    Migraines    Obesity    Right carpal tunnel syndrome    Smoker     Current Outpatient Medications on File Prior to  Visit  Medication Sig Dispense Refill   acetaminophen  (TYLENOL ) 325 MG tablet Take 650 mg by mouth every 6 (six) hours as needed.     acyclovir  (ZOVIRAX ) 200 MG capsule Take 1 capsule (200 mg total) by mouth 2 (two) times daily. 180 capsule 0   albuterol  (PROVENTIL ) (2.5 MG/3ML) 0.083% nebulizer solution Take 3 mLs (2.5 mg total) by nebulization every 6 (six) hours as needed for wheezing or shortness of breath. 75 mL 2   albuterol  (VENTOLIN  HFA) 108 (90 Base) MCG/ACT inhaler Inhale 2 puffs into the lungs every 6 (six) hours as needed for wheezing or shortness of breath. 18 g 1   allopurinol  (ZYLOPRIM ) 100 MG tablet Take 1 tablet by mouth once daily 90 tablet 1   amLODipine  (NORVASC ) 10 MG tablet Take 1 tablet (10 mg total) by mouth daily to lower blood pressure. 90 tablet 2   ascorbic acid  (VITAMIN C) 500 MG tablet Take 500 mg by mouth daily.     aspirin  (EQ ASPIRIN  ADULT LOW DOSE) 81 MG EC tablet Take 1 tablet (81 mg total) by mouth daily. Swallow whole. 90 tablet 3   atorvastatin  (LIPITOR) 40 MG tablet Take 1 tablet (40 mg total) by mouth daily. 90 tablet 2   colchicine  0.6 MG tablet Take 1 tablet (0.6 mg total) by mouth daily as needed (gout flare).  30 tablet 2   Fluticasone -Umeclidin-Vilant 200-62.5-25 MCG/ACT AEPB Inhale 1 puff into the lungs daily. Rinse mouth after each use. 60 each 4   ibuprofen  (ADVIL ) 800 MG tablet Take 1 tablet by mouth twice daily as needed 30 tablet 0   metoprolol  tartrate (LOPRESSOR ) 50 MG tablet Take 1 tablet by mouth twice daily 180 tablet 0   omeprazole  (PRILOSEC) 40 MG capsule Take 1 capsule (40 mg total) by mouth in the morning and at bedtime. 60 capsule 0   Semaglutide , 2 MG/DOSE, (OZEMPIC , 2 MG/DOSE,) 8 MG/3ML SOPN INJECT 2MG  INTO THE SKIN ONCE A WEEK 3 mL 1   dextromethorphan-guaiFENesin  (ROBITUSSIN-DM) 10-100 MG/5ML liquid Take 10 mLs by mouth every 4 (four) hours as needed for cough. (Patient not taking: Reported on 05/14/2024)     ferrous sulfate  325 (65 FE)  MG tablet Take 325 mg by mouth daily with breakfast. (Patient not taking: Reported on 05/14/2024)     Insulin  Pen Needle (B-D ULTRAFINE III SHORT PEN) 31G X 8 MM MISC Use as needed. 100 each 2   No current facility-administered medications on file prior to visit.     Current Outpatient Medications:    acetaminophen  (TYLENOL ) 325 MG tablet, Take 650 mg by mouth every 6 (six) hours as needed., Disp: , Rfl:    acyclovir  (ZOVIRAX ) 200 MG capsule, Take 1 capsule (200 mg total) by mouth 2 (two) times daily., Disp: 180 capsule, Rfl: 0   albuterol  (PROVENTIL ) (2.5 MG/3ML) 0.083% nebulizer solution, Take 3 mLs (2.5 mg total) by nebulization every 6 (six) hours as needed for wheezing or shortness of breath., Disp: 75 mL, Rfl: 2   albuterol  (VENTOLIN  HFA) 108 (90 Base) MCG/ACT inhaler, Inhale 2 puffs into the lungs every 6 (six) hours as needed for wheezing or shortness of breath., Disp: 18 g, Rfl: 1   allopurinol  (ZYLOPRIM ) 100 MG tablet, Take 1 tablet by mouth once daily, Disp: 90 tablet, Rfl: 1   amLODipine  (NORVASC ) 10 MG tablet, Take 1 tablet (10 mg total) by mouth daily to lower blood pressure., Disp: 90 tablet, Rfl: 2   ascorbic acid  (VITAMIN C) 500 MG tablet, Take 500 mg by mouth daily., Disp: , Rfl:    aspirin  (EQ ASPIRIN  ADULT LOW DOSE) 81 MG EC tablet, Take 1 tablet (81 mg total) by mouth daily. Swallow whole., Disp: 90 tablet, Rfl: 3   atorvastatin  (LIPITOR) 40 MG tablet, Take 1 tablet (40 mg total) by mouth daily., Disp: 90 tablet, Rfl: 2   colchicine  0.6 MG tablet, Take 1 tablet (0.6 mg total) by mouth daily as needed (gout flare)., Disp: 30 tablet, Rfl: 2   Fluticasone -Umeclidin-Vilant 200-62.5-25 MCG/ACT AEPB, Inhale 1 puff into the lungs daily. Rinse mouth after each use., Disp: 60 each, Rfl: 4   ibuprofen  (ADVIL ) 800 MG tablet, Take 1 tablet by mouth twice daily as needed, Disp: 30 tablet, Rfl: 0   metoprolol  tartrate (LOPRESSOR ) 50 MG tablet, Take 1 tablet by mouth twice daily, Disp: 180  tablet, Rfl: 0   omeprazole  (PRILOSEC) 40 MG capsule, Take 1 capsule (40 mg total) by mouth in the morning and at bedtime., Disp: 60 capsule, Rfl: 0   Semaglutide , 2 MG/DOSE, (OZEMPIC , 2 MG/DOSE,) 8 MG/3ML SOPN, INJECT 2MG  INTO THE SKIN ONCE A WEEK, Disp: 3 mL, Rfl: 1   spironolactone  (ALDACTONE ) 25 MG tablet, Take 1 tablet (25 mg total) by mouth daily., Disp: 90 tablet, Rfl: 3   dextromethorphan-guaiFENesin  (ROBITUSSIN-DM) 10-100 MG/5ML liquid, Take 10 mLs by mouth every 4 (  four) hours as needed for cough. (Patient not taking: Reported on 05/14/2024), Disp: , Rfl:    ferrous sulfate  325 (65 FE) MG tablet, Take 325 mg by mouth daily with breakfast. (Patient not taking: Reported on 05/14/2024), Disp: , Rfl:    Insulin  Pen Needle (B-D ULTRAFINE III SHORT PEN) 31G X 8 MM MISC, Use as needed., Disp: 100 each, Rfl: 2  Family History  Problem Relation Age of Onset   Diabetes Mother    Hypertension Mother    Aneurysm Mother    Stroke Mother    Cancer Mother        cervical cancer   Breast cancer Maternal Aunt    Cancer Maternal Aunt        breast   Cancer Cousin        breast/breast   Lupus Cousin    Heart disease Neg Hx    Colon cancer Neg Hx    Allergic rhinitis Neg Hx    Angioedema Neg Hx    Asthma Neg Hx    Atopy Neg Hx    Eczema Neg Hx    Immunodeficiency Neg Hx    Urticaria Neg Hx     Past Surgical History:  Procedure Laterality Date   BACK SURGERY  10/24/2020   CARDIAC CATHETERIZATION     CARPAL TUNNEL RELEASE Right 08/30/2020   Procedure: RIGHT CARPAL TUNNEL RELEASE;  Surgeon: Jerri Kay HERO, MD;  Location: [facility];  Service: Orthopedics;  Laterality: Right;   COLONOSCOPY  03/13/2016   Dr. Avram, normal, repeat 2027   CYST EXCISION     left neck/postauricular region, benign   DILATION AND CURETTAGE OF UTERUS     LAPAROSCOPIC ABDOMINAL EXPLORATION     removal of ectopic preg   LEFT HEART CATH AND CORONARY ANGIOGRAPHY N/A 06/23/2017   Procedure: LEFT HEART  CATH AND CORONARY ANGIOGRAPHY;  Surgeon: Mady Bruckner, MD;  Location: [facility];  Service: Cardiovascular;  Laterality: N/A;   TUBAL LIGATION       Review of Systems  Constitutional:  Negative for chills, fever, malaise/fatigue and weight loss.  HENT:  Negative for congestion, ear pain, hearing loss, sore throat and tinnitus.   Eyes:  Negative for blurred vision, pain and redness.  Respiratory:  Negative for cough, hemoptysis and shortness of breath.   Cardiovascular:  Negative for chest pain, palpitations, orthopnea, claudication and leg swelling.  Gastrointestinal:  Negative for abdominal pain, blood in stool, constipation, diarrhea, nausea and vomiting.  Genitourinary:  Negative for dysuria, flank pain, frequency, hematuria and urgency.  Musculoskeletal:  Negative for falls, joint pain and myalgias.  Skin:  Negative for itching and rash.  Neurological:  Negative for dizziness, tingling, speech change, weakness and headaches.  Endo/Heme/Allergies:  Negative for polydipsia. Does not bruise/bleed easily.  Psychiatric/Behavioral:  Negative for depression and memory loss. The patient is not nervous/anxious and does not have insomnia.        Objective:  BP 110/70   Pulse 81   Ht 5' 11 (1.803 m)   Wt 253 lb 3.2 oz (114.9 kg)   LMP 06/29/2015 (Exact Date)   SpO2 99%   BMI 35.31 kg/m   BP Readings from Last 3 Encounters:  05/14/24 110/70  05/06/24 124/78  04/26/24 110/70   Wt Readings from Last 3 Encounters:  05/14/24 253 lb 3.2 oz (114.9 kg)  05/06/24 252 lb 12.8 oz (114.7 kg)  04/26/24 250 lb 6.4 oz (113.6 kg)    General appearance: alert,  no distress, WD/WN, African American female Skin: unremarkable HEENT: normocephalic, conjunctiva/corneas normal, sclerae anicteric, PERRLA, EOMi, nares patent, no discharge or erythema, pharynx normal Oral cavity: MMM, tongue normal, teeth in good repair Neck: supple, no lymphadenopathy, no thyromegaly, no masses, normal ROM,  no bruits Chest: non tender, normal shape and expansion Heart: RRR, normal S1, S2, no murmurs Lungs: CTA bilaterally, no wheezes, rhonchi, or rales Abdomen: +bs, soft, non tender, non distended, no masses, no hepatomegaly, no splenomegaly, no bruits Back: +lumbar surgical scar, non tender, normal ROM, no scoliosis Musculoskeletal: upper extremities non tender, no obvious deformity, normal ROM throughout, lower extremities non tender, no obvious deformity, normal ROM throughout Extremities: no edema, no cyanosis, no clubbing Pulses: 2+ symmetric, upper and lower extremities, normal cap refill Neurological: alert, oriented x 3, CN2-12 intact, strength normal upper extremities and lower extremities, sensation normal throughout, DTRs 2+ throughout, no cerebellar signs, gait normal Psychiatric: normal affect, behavior normal, pleasant  Breast/gyn/rectal - deferred to gynecology   Diabetic Foot Exam - Simple   Simple Foot Form Diabetic Foot exam was performed with the following findings: Yes 05/14/2024  9:18 AM  Visual Inspection See comments: Yes Sensation Testing Intact to touch and monofilament testing bilaterally: Yes Pulse Check See comments: Yes Comments 1+ pedal pulses, left volar foot with single tender corn       Assessment and Plan :   Encounter Diagnoses  Name Primary?   Encounter for health maintenance examination in adult Yes   Screening mammogram for breast cancer    Vaccine counseling    Screening for colon cancer    Screening for hematuria or proteinuria    Elevated blood uric acid level    Essential hypertension    Type 2 diabetes mellitus without complication, without long-term current use of insulin  (HCC)    Vitamin D  deficiency    Mixed hyperlipidemia    BMI 35.0-35.9,adult    Aortic atherosclerosis (HCC)    Moderate persistent asthma with acute exacerbation    Lumbar radiculopathy, chronic    Corn of foot      This visit was a preventative care visit,  also known as wellness visit or routine physical.   Topics typically include healthy lifestyle, diet, exercise, preventative care, vaccinations, sick and well care, proper use of emergency dept and after hours care, as well as other concerns.    Separate significant issues discussed: Leg swelling-stop chlorthalidone  given chronic low potassium and intolerance to potassium.  Change to spironolactone   Hypertension-continue amlodipine  10 mg daily, Toprol  50 mg twice daily, and begin spironolactone  25 mg daily  History of elevated uric acid-labs ordered, continue allopurinol  100 mg daily  Aortic atherosclerosis, hyperlipidemia-continue atorvastatin  Lipitor 40 mg daily  BMI 35, obesity-work on efforts to lose weight through healthy diet and exercise  Corn of left foot-referral to podiatry  History of low potassium-additional labs ordered  Return fasting for labs next week  Diabetes-we stopped metformin  and held Ozempic  as 04/27/2024 given nausea and abdominal pain.  Continue to hold off on iron for the time being due to abdominal pain, constipation.  Chronic back pain-she wanted ibuprofen  800 mg to have on hand.  We discussed long-term risk of NSAIDs.  We will change to 600 mg ibuprofen  for as needed use, can use Tylenol  over-the-counter as well  Asthma-continue albuterol  rescue inhaler as needed, continue Trelegy for maintenance  Continue acyclovir  for prevention of herpetic lesions  Continue omeprazole /Prilosec for the time being  Since her recent back pain and nausea has  resolved she can resume trial of Ozempic  starting with 1 mg for the next month and then if no additional nausea or abdominal pain can go up to 2 mg again    General Recommendations: Continue to return yearly for your annual wellness and preventative care visits.  This gives us  a chance to discuss healthy lifestyle, exercise, vaccinations, review your chart record, and perform screenings where appropriate.  I  recommend you see your eye doctor yearly for routine vision care.  I recommend you see your dentist yearly for routine dental care including hygiene visits twice yearly.   Vaccination recommendations were reviewed Immunization History  Administered Date(s) Administered   Influenza,inj,Quad PF,6+ Mos 12/04/2015, 01/08/2017, 09/06/2020, 07/04/2021, 12/26/2022   Influenza-Unspecified 08/28/2018, 07/28/2023   PFIZER(Purple Top)SARS-COV-2 Vaccination 01/27/2020, 02/21/2020, 11/21/2020   PPD Test 07/27/2019, 12/15/2020, 12/03/2021, 12/09/2022   Pfizer(Comirnaty)Fall Seasonal Vaccine 12 years and older 07/28/2023   Tdap 11/30/2014, 06/21/2023    Vaccine recommendations: Shingrix, influenza, covid   Vaccines administered today: None.  Get your shingrix vaccine at your pharmacy   Screening for cancer: Colon cancer screening: Repeat in 2027.  Will send home with FIT test today for peroidica screening.   Prior or last colon cancer screen: 2017   Breast cancer screening: You should perform a self breast exam monthly.   We reviewed recommendations for regular mammograms and breast cancer screening. Last mammogram: due soon.  Cervical cancer screening: We reviewed recommendations for pap smear screening. Last pap 04/2023 reviewed  Skin cancer screening: Check your skin regularly for new changes, growing lesions, or other lesions of concern Come in for evaluation if you have skin lesions of concern.  Lung cancer screening: If you have a greater than 20 pack year history of tobacco use, then you may qualify for lung cancer screening with a chest CT scan.   Please call your insurance company to inquire about coverage for this test.  Pancreatic cancer: no current screening test is available routinely recommended.  (Risk factors: Smoking, overweight or obese, diabetes, chronic pancreatitis, work Nurse, mental health, Solicitor, 31 year old or greater, female greater than female,  African-American, family history of pancreatic cancer, hereditary breast, ovarian, melanoma, Lynch, Peutz-jeghers).  We currently don't have screenings for other cancers besides breast, cervical, colon, and lung cancers.  If you have a strong family history of cancer or have other cancer screening concerns, please let me know.    Bone health: Get at least 150 minutes of aerobic exercise weekly Get weight bearing exercise at least once weekly Bone density test:  A bone density test is an imaging test that uses a type of X-ray to measure the amount of calcium  and other minerals in your bones. The test may be used to diagnose or screen you for a condition that causes weak or thin bones (osteoporosis), predict your risk for a broken bone (fracture), or determine how well your osteoporosis treatment is working. The bone density test is recommended for females 65 and older, or females or males <65 if certain risk factors such as thyroid disease, long term use of steroids such as for asthma or rheumatological issues, vitamin D  deficiency, estrogen deficiency, family history of osteoporosis, self or family history of fragility fracture in first degree relative.    Heart health: Get at least 150 minutes of aerobic exercise weekly Limit alcohol It is important to maintain a healthy blood pressure and healthy cholesterol numbers  Heart disease screening: Screening for heart disease includes screening for blood pressure, fasting lipids, glucose/diabetes screening, BMI  height to weight ratio, reviewed of smoking status, physical activity, and diet.    Goals include blood pressure 120/80 or less, maintaining a healthy lipid/cholesterol profile, preventing diabetes or keeping diabetes numbers under good control, not smoking or using tobacco products, exercising most days per week or at least 150 minutes per week of exercise, and eating healthy variety of fruits and vegetables, healthy oils, and avoiding  unhealthy food choices like fried food, fast food, high sugar and high cholesterol foods.    Other tests may possibly include EKG test, CT coronary calcium  score, echocardiogram, exercise treadmill stress test.    Heart disease testing completed: Echocardiogram 2018, mild LV hypertrophy, EF 60-65%, no regional wall motion abnormality, abnormal relaxation, ascending aorta and left atrium mildly dilated  Cardiac cath 2018  Will updated echocardiogram at this time given HTN, prior dilated aorta   Vascular disease screening: For high risk individuals including smokers, diabetes, patients with known heart disease or high blood pressure, kidney disease, and others, screening for vascular disease or atherosclerosis of the arteries is available.  Examples may include carotid ultrasound, abdominal aortic ultrasound, ABI blood flow screening in the legs, thoracic aorta screening.  Carotid US  11/2023, no significant atherosclerotic plaque  AAA screen 11/2023 normal   Medical care options: I recommend you continue to seek care here first for routine care.  We try really hard to have available appointments Monday through Friday daytime hours for sick visits, acute visits, and physicals.  Urgent care should be used for after hours and weekends for significant issues that cannot wait till the next day.  The emergency department should be used for significant potentially life-threatening emergencies.  The emergency department is expensive, can often have long wait times for less significant concerns, so try to utilize primary care, urgent care, or telemedicine when possible to avoid unnecessary trips to the emergency department.  Virtual visits and telemedicine have been introduced since the pandemic started in 2020, and can be convenient ways to receive medical care.  We offer virtual appointments as well to assist you in a variety of options to seek medical care.   Legal Take the time to do a Last Will and  Testament, advanced directives including Healthcare Power of Attorney and Living Will documents.  Do not leave your family with burdens that can be handled ahead of time.   Advanced Directives: I recommend you consider completing a Health Care Power of Attorney and Living Will.   These documents respect your wishes and help alleviate burdens on your loved ones if you were to become terminally ill or be in a position to need those documents enforced.    You can complete Advanced Directives yourself, have them notarized, then have copies made for our office, for you and for anybody you feel should have them in safe keeping.  Or, you can have an attorney prepare these documents.   If you haven't updated your Last Will and Testament in a while, it may be worthwhile having an attorney prepare these documents together and save on some costs.       Spiritual and Emotional Health Keeping a healthy spiritual life can help you better manage your physical health. Your spiritual life can help you to cope with any issues that may arise with your physical health.  Balance can keep us  healthy and help us  to recover.  If you are struggling with your spiritual health there are questions that you may want to ask yourself:  What makes me  feel most complete? When do I feel most connected to the rest of the world? Where do I find the most inner strength? What am I doing when I feel whole?  Helpful tips: Being in nature. Some people feel very connected and at peace when they are walking outdoors or are outside. Helping others. Some feel the largest sense of wellbeing when they are of service to others. Being of service can take on many forms. It can be doing volunteer work, being kind to strangers, or offering a hand to a friend in need. Gratitude. Some people find they feel the most connected when they remain grateful. They may make lists of all the things they are grateful for or say a thank you out loud for  all they have.    Emotional Health Are you in tune with your emotional health?  Check out this link: http://www.marquez-love.com/   Financial Health Make sure you use a budget for your personal finances Make sure you are insured against risks (health insurance, life insurance, auto insurance, etc) Save more, spend less Set financial goals If you need help in this area, good resources include counseling through [employer] or other community resources, have a meeting with a Social research officer, government, and a good resource is Deatrice Shoulder podcast    Solara was seen today for annual exam.  Diagnoses and all orders for this visit:  Encounter for health maintenance examination in adult -     Cancel: Microalbumin/Creatinine Ratio, Urine; Future -     Lipid panel; Future -     Uric acid; Future -     MM 3D SCREENING MAMMOGRAM BILATERAL BREAST -     ECHOCARDIOGRAM COMPLETE; Future -     Ambulatory referral to Podiatry -     Microalbumin/Creatinine Ratio, Urine  Screening mammogram for breast cancer -     MM 3D SCREENING MAMMOGRAM BILATERAL BREAST  Vaccine counseling  Screening for colon cancer -     Fecal occult blood, imunochemical  Screening for hematuria or proteinuria  Elevated blood uric acid level  Essential hypertension -     ECHOCARDIOGRAM COMPLETE; Future  Type 2 diabetes mellitus without complication, without long-term current use of insulin  (HCC) -     Microalbumin/Creatinine Ratio, Urine  Vitamin D  deficiency  Mixed hyperlipidemia  BMI 35.0-35.9,adult  Aortic atherosclerosis (HCC)  Moderate persistent asthma with acute exacerbation  Lumbar radiculopathy, chronic  Corn of foot -     Ambulatory referral to Podiatry  Other orders -     spironolactone  (ALDACTONE ) 25 MG tablet; Take 1 tablet (25 mg total) by mouth daily.     Follow-up pending labs, yearly for physical

## 2024-05-17 ENCOUNTER — Telehealth: Payer: Self-pay | Admitting: Internal Medicine

## 2024-05-17 NOTE — Telephone Encounter (Signed)
 Needs PA for Echo done  Copied from CRM [id number]. Topic: General - Other >> May 17, 2024  2:38 PM Thersia C wrote: Reason for RMF:Inmnuyb from [place] called in regarding prior auth for patient echo , would like a callback as soon as possible as they had a cancellation and is able to see patient today  [phone]

## 2024-05-18 ENCOUNTER — Ambulatory Visit (HOSPITAL_COMMUNITY)
Admission: RE | Admit: 2024-05-18 | Discharge: 2024-05-18 | Disposition: A | Discharge status: Home / Self Care | Source: Ambulatory Visit | Attending: Medical | Admitting: Medical

## 2024-05-18 ENCOUNTER — Other Ambulatory Visit: Payer: Self-pay | Admitting: Medical

## 2024-05-18 DIAGNOSIS — I119 Hypertensive heart disease without heart failure: Secondary | ICD-10-CM | POA: Diagnosis not present

## 2024-05-18 DIAGNOSIS — E119 Type 2 diabetes mellitus without complications: Secondary | ICD-10-CM | POA: Diagnosis not present

## 2024-05-18 DIAGNOSIS — Z0000 Encounter for general adult medical examination without abnormal findings: Secondary | ICD-10-CM | POA: Insufficient documentation

## 2024-05-18 DIAGNOSIS — E785 Hyperlipidemia, unspecified: Secondary | ICD-10-CM | POA: Diagnosis not present

## 2024-05-18 DIAGNOSIS — I517 Cardiomegaly: Secondary | ICD-10-CM | POA: Diagnosis not present

## 2024-05-18 DIAGNOSIS — I10 Essential (primary) hypertension: Secondary | ICD-10-CM

## 2024-05-18 LAB — ECHOCARDIOGRAM COMPLETE
Area-P 1/2: 3.19 cm2
S' Lateral: 2.7 cm

## 2024-05-19 ENCOUNTER — Ambulatory Visit: Payer: Self-pay | Admitting: Medical

## 2024-05-19 ENCOUNTER — Other Ambulatory Visit: Payer: Self-pay | Admitting: Medical

## 2024-05-19 NOTE — Progress Notes (Signed)
 Your echocardiogram shows enlargement of the left ventricle, and all other parameters are stable.  I would recommend consult with cardiology to discuss the findings.  Normally we would put somebody on a medication such as valsartan given that you have had swelling with lisinopril  in the past, I am not comfortable doing that.  So if agreeable refer to cardiology.  Continue rest of medicines as usual

## 2024-05-20 ENCOUNTER — Other Ambulatory Visit

## 2024-05-20 ENCOUNTER — Ambulatory Visit: Admitting: Gastroenterology

## 2024-05-20 DIAGNOSIS — E876 Hypokalemia: Secondary | ICD-10-CM | POA: Diagnosis not present

## 2024-05-20 DIAGNOSIS — Z0000 Encounter for general adult medical examination without abnormal findings: Secondary | ICD-10-CM

## 2024-05-25 ENCOUNTER — Ambulatory Visit
Admission: RE | Admit: 2024-05-25 | Discharge: 2024-05-25 | Disposition: A | Discharge status: Home / Self Care | Source: Ambulatory Visit | Attending: Medical | Admitting: Medical

## 2024-05-25 DIAGNOSIS — Z1231 Encounter for screening mammogram for malignant neoplasm of breast: Secondary | ICD-10-CM | POA: Diagnosis not present

## 2024-05-26 LAB — CORTISOL: Cortisol: 10.4 ug/dL (ref 6.2–19.4)

## 2024-05-26 LAB — LIPID PANEL
Chol/HDL Ratio: 2.8 ratio (ref 0.0–4.4)
Cholesterol, Total: 124 mg/dL (ref 100–199)
HDL: 44 mg/dL (ref >39)
LDL Chol Calc (NIH): 46 mg/dL (ref 0–99)
Triglycerides: 214 mg/dL — ABNORMAL HIGH (ref 0–149)
VLDL Cholesterol Cal: 34 mg/dL (ref 5–40)

## 2024-05-26 LAB — URIC ACID: Uric Acid: 5.7 mg/dL (ref 3.0–7.2)

## 2024-05-26 LAB — MAGNESIUM: Magnesium: 1.8 mg/dL (ref 1.6–2.3)

## 2024-05-26 LAB — ALDOSTERONE: Aldosterone: 9.1 ng/dL (ref 0.0–30.0)

## 2024-05-26 NOTE — Progress Notes (Signed)
 Cholesterol numbers look okay but triglycerides are high.  Uric acid was okay.  Regarding low potassium, aldosterone normal, magnesium  normal, Cortisol levels also normal  We discontinued chlorthalidone  on 05/14/2024 given chronically low potassium.  We changed to spironolactone  so hopefully that is helping  Continue allopurinol   Hopefully she is tolerating the 1 mg Ozempic  currently  Work on eating a healthy low sugar low-fat diet.  If agreeable refer to nutritionist given the high triglycerides

## 2024-05-27 DIAGNOSIS — Z1211 Encounter for screening for malignant neoplasm of colon: Secondary | ICD-10-CM | POA: Diagnosis not present

## 2024-05-28 ENCOUNTER — Encounter (HOSPITAL_COMMUNITY)
Admission: RE | Admit: 2024-05-28 | Discharge: 2024-05-28 | Disposition: A | Discharge status: Home / Self Care | Source: Ambulatory Visit | Attending: Medical | Admitting: Medical

## 2024-05-28 ENCOUNTER — Encounter (HOSPITAL_COMMUNITY): Payer: Self-pay

## 2024-05-28 DIAGNOSIS — R1011 Right upper quadrant pain: Secondary | ICD-10-CM | POA: Diagnosis not present

## 2024-05-28 MED ORDER — TECHNETIUM TC 99M MEBROFENIN IV KIT
5.0000 | PACK | Freq: Once | INTRAVENOUS | Status: AC
  Administered 2024-05-28: 5 via INTRAVENOUS

## 2024-05-29 ENCOUNTER — Other Ambulatory Visit: Payer: Self-pay

## 2024-05-29 ENCOUNTER — Emergency Department (HOSPITAL_COMMUNITY)
Admission: EM | Admit: 2024-05-29 | Discharge: 2024-05-29 | Disposition: A | Discharge status: Home / Self Care | Attending: Emergency Medicine | Admitting: Emergency Medicine

## 2024-05-29 ENCOUNTER — Encounter (HOSPITAL_COMMUNITY): Payer: Self-pay | Admitting: Emergency Medicine

## 2024-05-29 ENCOUNTER — Emergency Department (HOSPITAL_COMMUNITY)

## 2024-05-29 ENCOUNTER — Other Ambulatory Visit: Payer: Self-pay | Admitting: Medical

## 2024-05-29 DIAGNOSIS — I10 Essential (primary) hypertension: Secondary | ICD-10-CM | POA: Diagnosis not present

## 2024-05-29 DIAGNOSIS — R0789 Other chest pain: Secondary | ICD-10-CM | POA: Insufficient documentation

## 2024-05-29 DIAGNOSIS — I700 Atherosclerosis of aorta: Secondary | ICD-10-CM | POA: Diagnosis not present

## 2024-05-29 DIAGNOSIS — F17200 Nicotine dependence, unspecified, uncomplicated: Secondary | ICD-10-CM | POA: Diagnosis not present

## 2024-05-29 DIAGNOSIS — R079 Chest pain, unspecified: Secondary | ICD-10-CM

## 2024-05-29 LAB — BASIC METABOLIC PANEL WITH GFR
Anion gap: 13 (ref 5–15)
BUN: 9 mg/dL (ref 6–20)
CO2: 24 mmol/L (ref 22–32)
Calcium: 9.1 mg/dL (ref 8.9–10.3)
Chloride: 104 mmol/L (ref 98–111)
Creatinine, Ser: 0.55 mg/dL (ref 0.44–1.00)
GFR, Estimated: >60 mL/min (ref >60)
Glucose, Bld: 132 mg/dL — ABNORMAL HIGH (ref 70–99)
Potassium: 3.6 mmol/L (ref 3.5–5.1)
Sodium: 141 mmol/L (ref 135–145)

## 2024-05-29 LAB — CBC
HCT: 42.7 % (ref 36.0–46.0)
Hemoglobin: 14.4 g/dL (ref 12.0–15.0)
MCH: 33.2 pg (ref 26.0–34.0)
MCHC: 33.7 g/dL (ref 30.0–36.0)
MCV: 98.4 fL (ref 80.0–100.0)
Platelets: 261 K/uL (ref 150–400)
RBC: 4.34 MIL/uL (ref 3.87–5.11)
RDW: 14.4 % (ref 11.5–15.5)
WBC: 8.3 K/uL (ref 4.0–10.5)
nRBC: 0 % (ref 0.0–0.2)

## 2024-05-29 LAB — D-DIMER, QUANTITATIVE: D-Dimer, Quant: 0.27 ug{FEU}/mL (ref 0.00–0.50)

## 2024-05-29 LAB — TROPONIN I (HIGH SENSITIVITY)
Troponin I (High Sensitivity): 8 ng/L (ref <18)
Troponin I (High Sensitivity): 7 ng/L (ref <18)

## 2024-05-29 MED ORDER — ACETAMINOPHEN 500 MG PO TABS
1000.0000 mg | ORAL_TABLET | Freq: Once | ORAL | Status: AC
  Administered 2024-05-29: 1000 mg via ORAL
  Filled 2024-05-29: qty 2

## 2024-05-29 MED ORDER — FAMOTIDINE 20 MG PO TABS
20.0000 mg | ORAL_TABLET | Freq: Once | ORAL | Status: AC
  Administered 2024-05-29: 20 mg via ORAL
  Filled 2024-05-29: qty 1

## 2024-05-29 NOTE — ED Triage Notes (Signed)
 Patient c/o chest tightness. Patient report pain woke her up this morning. Patient report nausea, denies vomiting. Patient denies SOB.

## 2024-05-29 NOTE — ED Provider Notes (Signed)
 [place] EMERGENCY DEPARTMENT AT [facility] Provider Note   CSN: [id number] Arrival date & time: 05/29/24  9474     History Chief Complaint  Patient presents with   Chest Pain    HPI Sydney Williams is a 58 y.o. female presenting for chief complaint of chest pain.  States that approximately 2 hours prior to arrival she was awoken with right-sided chest pain radiating into her shoulder. Echo 2025 with hypertrophy. Cardiac catheterization 2018 with no visible disease.  Patient's recorded medical, surgical, social, medication list and allergies were reviewed in the Snapshot window as part of the initial history.   Review of Systems   Review of Systems  Constitutional:  Negative for chills and fever.  HENT:  Negative for ear pain and sore throat.   Eyes:  Negative for pain and visual disturbance.  Respiratory:  Negative for cough and shortness of breath.   Cardiovascular:  Positive for chest pain. Negative for palpitations.  Gastrointestinal:  Negative for abdominal pain and vomiting.  Genitourinary:  Negative for dysuria and hematuria.  Musculoskeletal:  Negative for arthralgias and back pain.  Skin:  Negative for color change and rash.  Neurological:  Negative for seizures and syncope.  All other systems reviewed and are negative.   Physical Exam Updated Vital Signs BP (!) 152/93   Pulse 86   Temp 97.9 F (36.6 C) (Oral)   Resp 15   Ht 5' 11 (1.803 m)   Wt 114.8 kg   LMP 06/29/2015 (Exact Date)   SpO2 96%   BMI 35.29 kg/m  Physical Exam Vitals and nursing note reviewed.  Constitutional:      General: She is not in acute distress.    Appearance: She is well-developed.  HENT:     Head: Normocephalic and atraumatic.  Eyes:     Conjunctiva/sclera: Conjunctivae normal.  Cardiovascular:     Rate and Rhythm: Normal rate and regular rhythm.     Heart sounds: No murmur heard. Pulmonary:     Effort: Pulmonary effort is normal. No respiratory distress.      Breath sounds: Normal breath sounds.  Abdominal:     General: There is no distension.     Palpations: Abdomen is soft.     Tenderness: There is no abdominal tenderness. There is no right CVA tenderness or left CVA tenderness.  Musculoskeletal:        General: No swelling or tenderness. Normal range of motion.     Cervical back: Neck supple.  Skin:    General: Skin is warm and dry.  Neurological:     General: No focal deficit present.     Mental Status: She is alert and oriented to person, place, and time. Mental status is at baseline.     Cranial Nerves: No cranial nerve deficit.      ED Course/ Medical Decision Making/ A&P    Procedures Procedures   Medications Ordered in ED Medications  acetaminophen  (TYLENOL ) tablet 1,000 mg (1,000 mg Oral Given 05/29/24 0619)  famotidine  (PEPCID ) tablet 20 mg (20 mg Oral Given 05/29/24 9380)   Medical Decision Making: Sydney Williams is a 58 y.o. female who presented to the ED today with chest pain, detailed above.  Based on patient's comorbidities, patient has a heart score of 4.    Patient placed on continuous vitals and telemetry monitoring while in ED which was reviewed periodically.  Complete initial physical exam performed, notably the patient was HDS in NAD.   Reviewed  and confirmed nursing documentation for past medical history, family history, social history.    Initial Assessment: With the patient's presentation of left-sided chest pain, most likely diagnosis is musculoskeletal chest pain versus GERD, although ACS remains on the differential. Other diagnoses were considered including (but not limited to) pulmonary embolism, community-acquired pneumonia, aortic dissection, pneumothorax, underlying bony abnormality, anemia. These are considered less likely due to history of present illness and physical exam findings.    In particular, concerning pulmonary embolism: they deny malignancy, recent surgery, history of DVT, or calf tenderness  leading to a low risk Wells score. Aortic Dissection also reconsidered but seems less likely based on the location, quality, onset, and severity of symptoms in this case. Patient has a lack of serious comorbidities for this condition including a lack of HTN or Smoking. Patient also has a lack of underlying history of AD or TAA.  This is most consistent with an acute life/limb threatening illness complicated by underlying chronic conditions.   Initial Plan: EKG and serial troponin to evaluate for cardiac pathology. Evaluate for dissection, bony abnormality, or pneumonia with chest x-ray and screening laboratory evaluation including CBC, BMP  Further evaluation for pulmonary embolism  indicated at this time based on patient's PERC and Wells score. Ddimer with CTA if positive Further evaluation for Thoracic Aortic Dissection not indicated at this time based on patient's clinical history and PE findings.   Initial Study Results: EKG was reviewed independently. Rate, rhythm, axis, intervals all examined and without medically relevant abnormality. ST segments without concerns for elevations.    Notably, this patient arrived just prior to end of shift.  The above workup was ordered and pending at time of signout to oncoming team, anticipate outpatient referral to cardiology should workup be negative with plan for reassessment by oncoming team.          Clinical Impression:  1. Chest pain, unspecified type      Data Unavailable   Final Clinical Impression(s) / ED Diagnoses Final diagnoses:  Chest pain, unspecified type    Rx / DC Orders ED Discharge Orders          Ordered    Ambulatory referral to Cardiology        05/29/24 0619              Jerral Meth, MD 05/29/24 [phone]

## 2024-05-31 ENCOUNTER — Ambulatory Visit: Payer: Self-pay | Admitting: Medical

## 2024-05-31 NOTE — Progress Notes (Signed)
 Your HIDA scan/nuclear scan shows normal functioning of the gallbladder  If you are still getting right upper quadrant pains or nausea, then I suspect this could be either medication related, musculoskeletal related or possibly inflammation from the liver  The recommendation is a low sugar low-fat diet, regular exercise, and continued efforts to lose weight.  I would limit or avoid alcohol as well.  Let me know if still having a lot of pain in the right upper abdomen or abdomen in general

## 2024-06-01 LAB — FECAL OCCULT BLOOD, IMMUNOCHEMICAL: Fecal Occult Bld: NEGATIVE

## 2024-06-02 NOTE — Progress Notes (Signed)
 Stool test was negative thankfully

## 2024-06-11 ENCOUNTER — Ambulatory Visit (INDEPENDENT_AMBULATORY_CARE_PROVIDER_SITE_OTHER): Admitting: Podiatry

## 2024-06-11 ENCOUNTER — Ambulatory Visit: Admitting: Podiatry

## 2024-06-11 ENCOUNTER — Encounter: Payer: Self-pay | Admitting: Podiatry

## 2024-06-11 DIAGNOSIS — B070 Plantar wart: Secondary | ICD-10-CM | POA: Diagnosis not present

## 2024-06-11 DIAGNOSIS — M79671 Pain in right foot: Secondary | ICD-10-CM

## 2024-06-11 MED ORDER — FLUOROURACIL 5 % EX CREA: TOPICAL_CREAM | Freq: Two times a day (BID) | CUTANEOUS | 0 refills | Status: DC

## 2024-06-11 NOTE — Patient Instructions (Signed)
 Take dressing off in 8 hours and wash the foot with soap and water. If it is hurting or becomes uncomfortable before the 8 hours, go ahead and remove the bandage and wash the area.  If it blisters, apply antibiotic ointment and a band-aid.   Monitor for any signs/symptoms of infection. Call the office immediately if any occur or go directly to the emergency room. Call with any questions/concerns.  After any blistering results, begin applying the Efudex  cream twice a day to the spot.  Keep covered with a Band-Aid.  Continue to offload with nonmedicated callus pads. If you notice the surrounding skin becoming soft, lifting off for the skin appearing raw, take a break packing.

## 2024-06-11 NOTE — Progress Notes (Unsigned)
 Chief Complaint  Patient presents with   Foot Pain    Corn bottom of left heel. Top of right foot hurts.     HPI: 58 y.o. female presents today with chief complaint of painful mass bottom of the left heel.  Has been ongoing for over 2 years.  She has tried using Dr. Heriberto callus softener with minimal relief.  Of note she also does report some pain to the dorsal right foot where she thinks she has a bone spur  Past Medical History:  Diagnosis Date   Asthma    2015 hospitaliation for asthma   Diabetes mellitus    Type II   Eczema 05/16/2022   Family history of breast cancer    Fibroids 2010   Genital herpes 11/30/2014   GERD (gastroesophageal reflux disease)    Hyperlipidemia    Hypertension    Migraines    Obesity    Right carpal tunnel syndrome    Smoker     Past Surgical History:  Procedure Laterality Date   BACK SURGERY  10/24/2020   CARDIAC CATHETERIZATION     CARPAL TUNNEL RELEASE Right 08/30/2020   Procedure: RIGHT CARPAL TUNNEL RELEASE;  Surgeon: Jerri Kay HERO, MD;  Location:  SURGERY CENTER;  Service: Orthopedics;  Laterality: Right;   COLONOSCOPY  03/13/2016   Dr. Avram, normal, repeat 2027   CYST EXCISION     left neck/postauricular region, benign   DILATION AND CURETTAGE OF UTERUS     LAPAROSCOPIC ABDOMINAL EXPLORATION     removal of ectopic preg   LEFT HEART CATH AND CORONARY ANGIOGRAPHY N/A 06/23/2017   Procedure: LEFT HEART CATH AND CORONARY ANGIOGRAPHY;  Surgeon: Mady Bruckner, MD;  Location: [facility];  Service: Cardiovascular;  Laterality: N/A;   TUBAL LIGATION      Allergies  Allergen Reactions   Lisinopril  Swelling   Potassium Chloride  Shortness Of Breath and Swelling    Tolerates IV KCl, reaction only to PO product   Pneumococcal Vaccines Swelling and Other (See Comments)    Reaction:  Swelling at injection site   Vicodin [Hydrocodone-Acetaminophen ] Itching and Rash    Vicodin and percocet causes problems     ROS    Physical Exam: There were no vitals filed for this visit.  General: The patient is alert and oriented x3 in no acute distress.  Dermatology: Skin is warm, dry and supple bilateral lower extremities. Interspaces are clear of maceration and debris.    Vascular: Palpable pedal pulses bilaterally. Capillary refill within normal limits.  No appreciable edema.  No erythema or calor.  Neurological: Light touch sensation grossly intact bilateral feet.   Musculoskeletal Exam: No pedal deformities noted  Radiographic Exam:  Normal osseous mineralization. Joint spaces preserved.  No fractures or osseous irregularities noted.  Assessment/Plan of Care: 1. Plantar wart of left foot      Meds ordered this encounter  Medications   fluorouracil  (EFUDEX ) 5 % cream    Sig: Apply topically 2 (two) times daily.    Dispense:  40 g    Refill:  0   None  Discussed clinical findings with patient today.  ***   Fortune Torosian L. Lamount MAUL, AACFAS [facility]     [address]  [place], [place] [address]                Office [phone]  Fax [phone]   A1c 5.9 Left plantar heel/proximal arch plantar wart She is diabetic and places  Ulcer right foot x-ray with areas bone spur, synovitis present.  Also did shave down callus right first IPJ  She does get pedicures for her nails

## 2024-06-13 ENCOUNTER — Emergency Department (HOSPITAL_COMMUNITY)

## 2024-06-13 ENCOUNTER — Other Ambulatory Visit: Payer: Self-pay

## 2024-06-13 ENCOUNTER — Encounter (HOSPITAL_COMMUNITY): Payer: Self-pay

## 2024-06-13 ENCOUNTER — Observation Stay (HOSPITAL_COMMUNITY)
Admission: EM | Admit: 2024-06-13 | Discharge: 2024-06-14 | Disposition: A | Discharge status: Home / Self Care | Attending: Internal Medicine | Admitting: Internal Medicine

## 2024-06-13 ENCOUNTER — Inpatient Hospital Stay (HOSPITAL_COMMUNITY)

## 2024-06-13 DIAGNOSIS — K219 Gastro-esophageal reflux disease without esophagitis: Secondary | ICD-10-CM | POA: Insufficient documentation

## 2024-06-13 DIAGNOSIS — E11621 Type 2 diabetes mellitus with foot ulcer: Principal | ICD-10-CM | POA: Insufficient documentation

## 2024-06-13 DIAGNOSIS — E119 Type 2 diabetes mellitus without complications: Secondary | ICD-10-CM | POA: Diagnosis not present

## 2024-06-13 DIAGNOSIS — M19072 Primary osteoarthritis, left ankle and foot: Secondary | ICD-10-CM | POA: Diagnosis not present

## 2024-06-13 DIAGNOSIS — M79672 Pain in left foot: Secondary | ICD-10-CM | POA: Diagnosis not present

## 2024-06-13 DIAGNOSIS — R600 Localized edema: Secondary | ICD-10-CM | POA: Diagnosis not present

## 2024-06-13 DIAGNOSIS — M7989 Other specified soft tissue disorders: Secondary | ICD-10-CM | POA: Diagnosis not present

## 2024-06-13 DIAGNOSIS — F17210 Nicotine dependence, cigarettes, uncomplicated: Secondary | ICD-10-CM | POA: Diagnosis not present

## 2024-06-13 DIAGNOSIS — E11628 Type 2 diabetes mellitus with other skin complications: Secondary | ICD-10-CM | POA: Diagnosis not present

## 2024-06-13 DIAGNOSIS — J45909 Unspecified asthma, uncomplicated: Secondary | ICD-10-CM | POA: Diagnosis not present

## 2024-06-13 DIAGNOSIS — Z7982 Long term (current) use of aspirin: Secondary | ICD-10-CM | POA: Diagnosis not present

## 2024-06-13 DIAGNOSIS — I10 Essential (primary) hypertension: Secondary | ICD-10-CM | POA: Insufficient documentation

## 2024-06-13 DIAGNOSIS — M7732 Calcaneal spur, left foot: Secondary | ICD-10-CM | POA: Diagnosis not present

## 2024-06-13 DIAGNOSIS — L03116 Cellulitis of left lower limb: Secondary | ICD-10-CM

## 2024-06-13 DIAGNOSIS — L089 Local infection of the skin and subcutaneous tissue, unspecified: Secondary | ICD-10-CM | POA: Diagnosis not present

## 2024-06-13 DIAGNOSIS — E872 Acidosis, unspecified: Secondary | ICD-10-CM

## 2024-06-13 DIAGNOSIS — L97529 Non-pressure chronic ulcer of other part of left foot with unspecified severity: Secondary | ICD-10-CM | POA: Insufficient documentation

## 2024-06-13 DIAGNOSIS — S86012A Strain of left Achilles tendon, initial encounter: Secondary | ICD-10-CM | POA: Diagnosis not present

## 2024-06-13 DIAGNOSIS — F10920 Alcohol use, unspecified with intoxication, uncomplicated: Secondary | ICD-10-CM | POA: Diagnosis not present

## 2024-06-13 DIAGNOSIS — B070 Plantar wart: Secondary | ICD-10-CM

## 2024-06-13 LAB — GLUCOSE, CAPILLARY
Glucose-Capillary: 124 mg/dL — ABNORMAL HIGH (ref 70–99)
Glucose-Capillary: 102 mg/dL — ABNORMAL HIGH (ref 70–99)

## 2024-06-13 LAB — COMPREHENSIVE METABOLIC PANEL WITH GFR
ALT: 31 U/L (ref 0–44)
AST: 31 U/L (ref 15–41)
Albumin: 3.6 g/dL (ref 3.5–5.0)
Alkaline Phosphatase: 82 U/L (ref 38–126)
Anion gap: 13 (ref 5–15)
BUN: 11 mg/dL (ref 6–20)
CO2: 28 mmol/L (ref 22–32)
Calcium: 9.4 mg/dL (ref 8.9–10.3)
Chloride: 100 mmol/L (ref 98–111)
Creatinine, Ser: 0.74 mg/dL (ref 0.44–1.00)
GFR, Estimated: >60 mL/min (ref >60)
Glucose, Bld: 89 mg/dL (ref 70–99)
Potassium: 4 mmol/L (ref 3.5–5.1)
Sodium: 141 mmol/L (ref 135–145)
Total Bilirubin: 0.7 mg/dL (ref 0.0–1.2)
Total Protein: 7.7 g/dL (ref 6.5–8.1)

## 2024-06-13 LAB — CBC WITH DIFFERENTIAL/PLATELET
Abs Immature Granulocytes: 0.02 K/uL (ref 0.00–0.07)
Basophils Absolute: 0 K/uL (ref 0.0–0.1)
Basophils Relative: 1 %
Eosinophils Absolute: 0.2 K/uL (ref 0.0–0.5)
Eosinophils Relative: 3 %
HCT: 42.1 % (ref 36.0–46.0)
Hemoglobin: 13.9 g/dL (ref 12.0–15.0)
Immature Granulocytes: 0 %
Lymphocytes Relative: 33 %
Lymphs Abs: 2 K/uL (ref 0.7–4.0)
MCH: 33.3 pg (ref 26.0–34.0)
MCHC: 33 g/dL (ref 30.0–36.0)
MCV: 101 fL — ABNORMAL HIGH (ref 80.0–100.0)
Monocytes Absolute: 0.7 K/uL (ref 0.1–1.0)
Monocytes Relative: 12 %
Neutro Abs: 3.1 K/uL (ref 1.7–7.7)
Neutrophils Relative %: 51 %
Platelets: 225 K/uL (ref 150–400)
RBC: 4.17 MIL/uL (ref 3.87–5.11)
RDW: 14.9 % (ref 11.5–15.5)
WBC: 6 K/uL (ref 4.0–10.5)
nRBC: 0 % (ref 0.0–0.2)

## 2024-06-13 LAB — I-STAT CG4 LACTIC ACID, ED: Lactic Acid, Venous: 2.4 mmol/L (ref 0.5–1.9)

## 2024-06-13 LAB — CG4 I-STAT (LACTIC ACID): Lactic Acid, Venous: 1.9 mmol/L (ref 0.5–1.9)

## 2024-06-13 LAB — LACTIC ACID, PLASMA: Lactic Acid, Venous: 1.8 mmol/L (ref 0.5–1.9)

## 2024-06-13 MED ORDER — LIDOCAINE HCL (PF) 1 % IJ SOLN
5.0000 mL | Freq: Once | INTRAMUSCULAR | Status: AC
  Administered 2024-06-13: 5 mL
  Filled 2024-06-13: qty 30

## 2024-06-13 MED ORDER — PANTOPRAZOLE SODIUM 40 MG PO TBEC
40.0000 mg | DELAYED_RELEASE_TABLET | Freq: Two times a day (BID) | ORAL | Status: DC
  Administered 2024-06-13 – 2024-06-14 (×2): 40 mg via ORAL
  Filled 2024-06-13 (×2): qty 1

## 2024-06-13 MED ORDER — INSULIN ASPART 100 UNIT/ML IJ SOLN
0.0000 [IU] | Freq: Three times a day (TID) | INTRAMUSCULAR | Status: DC
  Administered 2024-06-13: 2 [IU] via SUBCUTANEOUS
  Filled 2024-06-13: qty 0.15

## 2024-06-13 MED ORDER — METOPROLOL TARTRATE 50 MG PO TABS
50.0000 mg | ORAL_TABLET | Freq: Two times a day (BID) | ORAL | Status: DC
  Administered 2024-06-13 – 2024-06-14 (×2): 50 mg via ORAL
  Filled 2024-06-13 (×2): qty 1

## 2024-06-13 MED ORDER — LORAZEPAM 0.5 MG PO TABS
0.5000 mg | ORAL_TABLET | Freq: Once | ORAL | Status: AC
  Administered 2024-06-13: 0.5 mg via ORAL
  Filled 2024-06-13: qty 1

## 2024-06-13 MED ORDER — IBUPROFEN 400 MG PO TABS: 400.0000 mg | ORAL_TABLET | Freq: Four times a day (QID) | ORAL | Status: DC | PRN

## 2024-06-13 MED ORDER — LINEZOLID 600 MG/300ML IV SOLN
600.0000 mg | Freq: Once | INTRAVENOUS | Status: AC
  Administered 2024-06-13: 600 mg via INTRAVENOUS
  Filled 2024-06-13: qty 300

## 2024-06-13 MED ORDER — SODIUM CHLORIDE 0.9 % IV SOLN: INTRAVENOUS | Status: DC

## 2024-06-13 MED ORDER — SODIUM CHLORIDE 0.9 % IV SOLN
3.0000 g | Freq: Four times a day (QID) | INTRAVENOUS | Status: DC
  Administered 2024-06-13 – 2024-06-14 (×3): 3 g via INTRAVENOUS
  Filled 2024-06-13 (×4): qty 8

## 2024-06-13 MED ORDER — OXYCODONE HCL 5 MG PO TABS
5.0000 mg | ORAL_TABLET | ORAL | Status: DC | PRN
  Administered 2024-06-13 – 2024-06-14 (×3): 5 mg via ORAL
  Filled 2024-06-13 (×3): qty 1

## 2024-06-13 MED ORDER — ENOXAPARIN SODIUM 60 MG/0.6ML IJ SOSY
55.0000 mg | PREFILLED_SYRINGE | Freq: Every day | INTRAMUSCULAR | Status: DC
  Administered 2024-06-13: 55 mg via SUBCUTANEOUS
  Filled 2024-06-13 (×2): qty 0.6

## 2024-06-13 MED ORDER — SODIUM CHLORIDE 0.9 % IV SOLN
3.0000 g | Freq: Once | INTRAVENOUS | Status: AC
  Administered 2024-06-13: 3 g via INTRAVENOUS
  Filled 2024-06-13: qty 8

## 2024-06-13 MED ORDER — MORPHINE SULFATE (PF) 2 MG/ML IV SOLN: 1.0000 mg | INTRAVENOUS | Status: DC | PRN

## 2024-06-13 MED ORDER — ONDANSETRON HCL 4 MG PO TABS: 4.0000 mg | ORAL_TABLET | Freq: Four times a day (QID) | ORAL | Status: DC | PRN

## 2024-06-13 MED ORDER — ONDANSETRON HCL 4 MG/2ML IJ SOLN: 4.0000 mg | Freq: Four times a day (QID) | INTRAMUSCULAR | Status: DC | PRN

## 2024-06-13 MED ORDER — SPIRONOLACTONE 25 MG PO TABS
25.0000 mg | ORAL_TABLET | Freq: Every day | ORAL | Status: DC
  Administered 2024-06-14: 25 mg via ORAL
  Filled 2024-06-13: qty 1

## 2024-06-13 MED ORDER — TRAZODONE HCL 50 MG PO TABS
25.0000 mg | ORAL_TABLET | Freq: Every evening | ORAL | Status: DC | PRN
  Administered 2024-06-13: 25 mg via ORAL
  Filled 2024-06-13: qty 1

## 2024-06-13 MED ORDER — LINEZOLID 600 MG/300ML IV SOLN
600.0000 mg | Freq: Once | INTRAVENOUS | Status: AC
  Administered 2024-06-14: 600 mg via INTRAVENOUS
  Filled 2024-06-13: qty 300

## 2024-06-13 MED ORDER — ALBUTEROL SULFATE (2.5 MG/3ML) 0.083% IN NEBU: 2.5000 mg | INHALATION_SOLUTION | RESPIRATORY_TRACT | Status: DC | PRN

## 2024-06-13 MED ORDER — GADOBUTROL 1 MMOL/ML IV SOLN
10.0000 mL | Freq: Once | INTRAVENOUS | Status: AC | PRN
  Administered 2024-06-13: 10 mL via INTRAVENOUS

## 2024-06-13 MED ORDER — SODIUM CHLORIDE 0.9 % IV BOLUS
1000.0000 mL | Freq: Once | INTRAVENOUS | Status: AC
  Administered 2024-06-13: 1000 mL via INTRAVENOUS

## 2024-06-13 MED ORDER — INSULIN ASPART 100 UNIT/ML IJ SOLN
0.0000 [IU] | Freq: Every day | INTRAMUSCULAR | Status: DC
  Filled 2024-06-13: qty 0.05

## 2024-06-13 NOTE — ED Provider Notes (Signed)
 [place] EMERGENCY DEPARTMENT AT [facility] Provider Note   CSN: [id number] Arrival date & time: 06/13/24  1157     Patient presents with: Foot Pain   Sydney Williams is a 58 y.o. female.   58 year old female presenting with left foot pain.  Patient reports that she was seen by her podiatrist on Friday and have the callus of her left foot shaved down, she did not have any pain immediately following the procedure however later that night at home she began to have severe pain at the site of the callus, telling me that she cannot bear any weight on this foot secondary to pain and has been walking around using a crutch because of this.  She also reports worsening swelling of the foot/ankle, she reports a fever of 101 yesterday.  She has been taking Tylenol /ibuprofen  for pain with minimal relief of her symptoms.  She is diabetic.  She was given fluorouracil  by her podiatrist and has been applying this as directed.   Foot Pain       Prior to Admission medications   Medication Sig Start Date End Date Taking? Authorizing Provider  acetaminophen  (TYLENOL ) 325 MG tablet Take 650 mg by mouth every 6 (six) hours as needed.    [provider]  acyclovir  (ZOVIRAX ) 200 MG capsule Take 1 capsule (200 mg total) by mouth 2 (two) times daily. 04/19/24   Tysinger, Alm RAMAN, PA-C  albuterol  (PROVENTIL ) (2.5 MG/3ML) 0.083% nebulizer solution Take 3 mLs (2.5 mg total) by nebulization every 6 (six) hours as needed for wheezing or shortness of breath. 11/06/23   Tysinger, Alm RAMAN, PA-C  albuterol  (VENTOLIN  HFA) 108 (90 Base) MCG/ACT inhaler Inhale 2 puffs into the lungs every 6 (six) hours as needed for wheezing or shortness of breath. 11/06/23   Tysinger, Alm RAMAN, PA-C  allopurinol  (ZYLOPRIM ) 100 MG tablet Take 1 tablet by mouth once daily 12/26/23   Tysinger, Alm RAMAN, PA-C  amLODipine  (NORVASC ) 10 MG tablet Take 1 tablet (10 mg total) by mouth daily to lower blood pressure. 12/19/23   Tysinger,  Alm RAMAN, PA-C  ascorbic acid  (VITAMIN C) 500 MG tablet Take 500 mg by mouth daily.    [provider]  aspirin  (EQ ASPIRIN  ADULT LOW DOSE) 81 MG EC tablet Take 1 tablet (81 mg total) by mouth daily. Swallow whole. 02/03/18   Tysinger, Alm RAMAN, PA-C  atorvastatin  (LIPITOR) 40 MG tablet Take 1 tablet (40 mg total) by mouth daily. 10/27/23   Tysinger, Alm RAMAN, PA-C  colchicine  0.6 MG tablet Take 1 tablet (0.6 mg total) by mouth daily as needed (gout flare). 12/19/23   Tysinger, Alm RAMAN, PA-C  dextromethorphan-guaiFENesin  (ROBITUSSIN-DM) 10-100 MG/5ML liquid Take 10 mLs by mouth every 4 (four) hours as needed for cough.    [provider]  ferrous sulfate  325 (65 FE) MG tablet Take 325 mg by mouth daily with breakfast.    [provider]  fluorouracil  (EFUDEX ) 5 % cream Apply topically 2 (two) times daily. 06/11/24 08/10/24  Lamount Ethan CROME, DPM  Fluticasone -Umeclidin-Vilant 200-62.5-25 MCG/ACT AEPB Inhale 1 puff into the lungs daily. Rinse mouth after each use. 04/30/23   Brien Belvie BRAVO, MD  ibuprofen  (ADVIL ) 800 MG tablet Take 1 tablet by mouth twice daily as needed 05/14/24   Tysinger, Alm RAMAN, PA-C  Insulin  Pen Needle (B-D ULTRAFINE III SHORT PEN) 31G X 8 MM MISC Use as needed. 12/26/22   Brien Belvie BRAVO, MD  metFORMIN  (GLUCOPHAGE ) 500 MG tablet  Take 500 mg by mouth daily with breakfast.    [provider]  metoprolol  tartrate (LOPRESSOR ) 50 MG tablet Take 1 tablet by mouth twice daily 05/18/24   Tysinger, Alm RAMAN, PA-C  omeprazole  (PRILOSEC) 40 MG capsule Take 1 capsule (40 mg total) by mouth in the morning and at bedtime. 04/29/24   Tysinger, Alm RAMAN, PA-C  Semaglutide , 1 MG/DOSE, (OZEMPIC , 1 MG/DOSE,) 4 MG/3ML SOPN Inject 1 mg into the skin once a week. 05/14/24   Tysinger, Alm RAMAN, PA-C  Semaglutide , 2 MG/DOSE, (OZEMPIC , 2 MG/DOSE,) 8 MG/3ML SOPN INJECT 2 MG INTO THE SKIN ONCE A WEEK 05/31/24   Tysinger, Alm RAMAN, PA-C  spironolactone  (ALDACTONE ) 25 MG tablet Take 1 tablet  (25 mg total) by mouth daily. 05/14/24   Tysinger, Alm RAMAN, PA-C    Allergies: Lisinopril , Potassium chloride , Pneumococcal vaccines, and Vicodin [hydrocodone-acetaminophen ]    Review of Systems  Updated Vital Signs  Vitals:   06/13/24 1204 06/13/24 1207 06/13/24 1515 06/13/24 1613  BP: (!) 162/92  (!) 142/82 (!) 166/97  Pulse: 83  80 80  Resp: 16  16 16   Temp: 98 F (36.7 C)   98 F (36.7 C)  TempSrc: Oral   Oral  SpO2: 95%  98% 97%  Weight:  114.8 kg    Height:  5' 11 (1.803 m)       Physical Exam Vitals and nursing note reviewed.  HENT:     Head: Normocephalic.  Eyes:     Extraocular Movements: Extraocular movements intact.  Cardiovascular:     Rate and Rhythm: Normal rate.  Pulmonary:     Effort: Pulmonary effort is normal.  Musculoskeletal:     Cervical back: Normal range of motion.     Comments: Moves all extremities spontaneously without difficulty LLE: Minimal non-pitting edema to L ankle  Skin:    Comments: Callus with possible fluid collection noted to plantar aspect of L foot with surrounding erythema, see photo. No active drainage, there is no obvious streaking erythema. This area is exquisitely tender to palpation.  Neurological:     Mental Status: She is alert and oriented to person, place, and time.     (all labs ordered are listed, but only abnormal results are displayed) Labs Reviewed  CBC WITH DIFFERENTIAL/PLATELET - Abnormal; Notable for the following components:      Result Value   MCV 101.0 (*)    All other components within normal limits  I-STAT CG4 LACTIC ACID, ED - Abnormal; Notable for the following components:   Lactic Acid, Venous 2.4 (*)    All other components within normal limits  CULTURE, BLOOD (ROUTINE X 2)  CULTURE, BLOOD (ROUTINE X 2)  COMPREHENSIVE METABOLIC PANEL WITH GFR  LACTIC ACID, PLASMA  I-STAT CG4 LACTIC ACID, ED    EKG: None  Radiology: DG Foot Complete Left Result Date: 06/13/2024 EXAM: 3 or more VIEW(S)  XRAY OF THE LEFT FOOT 06/13/2024 01:10:00 PM COMPARISON: None available. CLINICAL HISTORY: Concern for infection, diabetic. Per chart: Patient is diabetic and had callus scraped on left foot and podiatry told her to leave the medication on for 8 hours then wash it off and reapply. Patient reports pain has got worse where she can't walk on it. Lesion appears inflamed and possible cellulitis. FINDINGS: BONES AND JOINTS: No acute fracture, dislocation or focal bone erosion. Mild degenerative changes at the first MTP (metatarsophalangeal) joint. Plantar heel spurs. SOFT TISSUES: The soft tissues are unremarkable. IMPRESSION: 1. No acute fracture, dislocation, or  focal bone erosion. 2. Mild degenerative changes at the first MTP joint. 3. Plantar heel spur. Electronically signed by: Waddell Calk MD 06/13/2024 01:37 PM EDT RP Workstation: [id number]     .Incision and Drainage  Date/Time: 06/13/2024 2:53 PM  Performed by: Glendia Rocky SAILOR, PA-C Authorized by: Glendia Rocky SAILOR, PA-C   Consent:    Consent obtained:  Verbal   Consent given by:  Patient   Risks, benefits, and alternatives were discussed: yes     Risks discussed:  Bleeding, incomplete drainage, pain, infection and damage to other organs   Alternatives discussed:  No treatment Universal protocol:    Procedure explained and questions answered to patient or proxy's satisfaction: yes     Patient identity confirmed:  Verbally with patient Location:    Type:  Fluid collection   Size:  2cm   Location:  Lower extremity   Lower extremity location:  Foot   Foot location:  L foot Pre-procedure details:    Procedure prep: alcohol. Sedation:    Sedation type:  None Anesthesia:    Anesthesia method:  Local infiltration   Local anesthetic:  Lidocaine  1% w/o epi Procedure type:    Complexity:  Simple Procedure details:    Incision types:  Single straight   Wound management:  Irrigated with saline   Drainage:  Serous   Drainage amount:  Scant    Wound treatment:  Wound left open Post-procedure details:    Procedure completion:  Tolerated    Medications Ordered in the ED - No data to display                                  Medical Decision Making This patient presents to the ED for concern of foot pain, this involves an extensive number of treatment options, and is a complaint that carries with it a high risk of complications and morbidity.  The differential diagnosis includes callus, diabetic foot wound, osteomyelitis, cellulitis, erysipelas   Co morbidities that complicate the patient evaluation  Diabetes   Additional history obtained:  Additional history obtained from record review External records from outside source obtained and reviewed including recent podiatry note   Lab Tests:  I Ordered, and personally interpreted labs.  The pertinent results include: CBC within normal limits, no leukocytosis. CMP within normal limits. Lactic acid elevated at 2.4.  CMP within normal limits.   Imaging Studies ordered:  I ordered imaging studies including L foot XR  I independently visualized and interpreted imaging which showed 1. No acute fracture, dislocation, or focal bone erosion. 2. Mild degenerative changes at the first MTP joint. 3. Plantar heel spur.  I agree with the radiologist interpretation   Cardiac Monitoring: / EKG:  The patient was maintained on a cardiac monitor.  I personally viewed and interpreted the cardiac monitored which showed an underlying rhythm of: NSR   Consultations Obtained:  I requested consultation with the hospitalist,  and discussed lab and imaging findings as well as pertinent plan - they recommend: Admission for IV antibiotics for suspected cellulitis, plan to consult podiatry. I messaged the podiatrist, Dr. Tobie, he requests that an MRI of her left foot is ordered, he is aware that she is being admitted for IV antibiotics.   Problem List / ED Course / Critical interventions /  Medication management  I ordered medication including Unasyn  and linezolid  for suspected cellulitis versus diabetic foot wound, IV fluids I have  reviewed the patients home medicines and have made adjustments as needed   Social Determinants of Health:  Tobacco use   Test / Admission - Considered:  Physical exam is notable as above, the plantar aspect of the patient's left foot is exquisitely tender out of proportion to exam, even just the slightest gentle pressure over her foot causes exquisite pain, and there is what appears to be surrounding cellulitic changes.  I attempted an incision and drainage of the area, as it appears that her was possibly a fluid collection beneath the callus, however only a minimal amount of clear fluid was drained, and this was exquisitely painful to the patient.  I collected wound cultures from the drained material.  I started her on IV antibiotics given her elevated lactic acid as well as fever of 101 at home yesterday, however she is afebrile today and is not demonstrating leukocytosis.  I spoke with the hospitalist service who is in agreement that she would likely benefit from admission for IV antibiotics and continued monitoring of her wound, I also spoke with podiatry who recommends an MRI of the foot as well.  She is appropriate for admission at this time.    Amount and/or Complexity of Data Reviewed Labs: ordered. Radiology: ordered.  Risk Prescription drug management. Decision regarding hospitalization.        Final diagnoses:  Foot pain, left  Lactic acid increased  Cellulitis of left lower extremity    ED Discharge Orders     None          Glendia Rocky LOISE DEVONNA 06/13/24 1635    Randol Simmonds, MD 06/14/24 1501

## 2024-06-13 NOTE — ED Triage Notes (Signed)
 Patient is diabetic and had callus scraped on left foot and podiatry told her to leave the medication on for 8 hours then wash it off and reapply.  Patient reports pain has got worse where she can't walk on it.  Lesion appears inflammed and possible cellulitis.

## 2024-06-13 NOTE — H&P (Addendum)
 History and Physical  DOROTA HEINRICHS FMW:[id number] DOB: September 18, 1966 DOA: 06/13/2024  PCP: Bulah Alm RAMAN, PA-C   Chief Complaint: Left foot pain  HPI: Sydney Williams is a 58 y.o. female with medical history significant for well-controlled type 2 diabetes, GERD, hypertension, hyperlipidemia and obesity being admitted to the hospital with concern for left foot cellulitis after plantar wart removal on 8/15.  She was seen by her podiatrist Dr. Lamount on 8/15 he performed bedside debridement in the office of a small callus on the medial portion of her right great toe, as well as scraped what he felt was a plantar wart on the bottom of her left foot.  The site on her right toe feels fine and is not bothering her, but she has significant pain and some erythema at the bottom of her left foot.  She denies any fevers or chills, pain does not radiate up into her ankle a little bit.  She has never had any diabetic foot ulcers or infections.  Review of Systems: Please see HPI for pertinent positives and negatives. A complete 10 system review of systems are otherwise negative.  Past Medical History:  Diagnosis Date   Asthma    2015 hospitaliation for asthma   Diabetes mellitus    Type II   Eczema 05/16/2022   Family history of breast cancer    Fibroids 2010   Genital herpes 11/30/2014   GERD (gastroesophageal reflux disease)    Hyperlipidemia    Hypertension    Migraines    Obesity    Right carpal tunnel syndrome    Smoker    Past Surgical History:  Procedure Laterality Date   BACK SURGERY  10/24/2020   CARDIAC CATHETERIZATION     CARPAL TUNNEL RELEASE Right 08/30/2020   Procedure: RIGHT CARPAL TUNNEL RELEASE;  Surgeon: Jerri Kay CHRISTELLA, MD;  Location: [facility];  Service: Orthopedics;  Laterality: Right;   COLONOSCOPY  03/13/2016   Dr. Avram, normal, repeat 2027   CYST EXCISION     left neck/postauricular region, benign   DILATION AND CURETTAGE OF UTERUS     LAPAROSCOPIC  ABDOMINAL EXPLORATION     removal of ectopic preg   LEFT HEART CATH AND CORONARY ANGIOGRAPHY N/A 06/23/2017   Procedure: LEFT HEART CATH AND CORONARY ANGIOGRAPHY;  Surgeon: Mady Bruckner, MD;  Location: [facility];  Service: Cardiovascular;  Laterality: N/A;   TUBAL LIGATION     Social History:  reports that she has been smoking cigarettes. She has a 3.5 pack-year smoking history. She has never used smokeless tobacco. She reports current alcohol use. She reports that she does not use drugs.  Allergies  Allergen Reactions   Lisinopril  Swelling   Potassium Chloride  Shortness Of Breath and Swelling    Tolerates IV KCl, reaction only to PO product   Pneumococcal Vaccines Swelling and Other (See Comments)    Reaction:  Swelling at injection site   Vicodin [Hydrocodone-Acetaminophen ] Itching and Rash    Vicodin and percocet causes problems    Family History  Problem Relation Age of Onset   Diabetes Mother    Hypertension Mother    Aneurysm Mother    Stroke Mother    Cancer Mother        cervical cancer   Breast cancer Maternal Aunt    Cancer Maternal Aunt        breast   Cancer Cousin        breast/breast   Lupus Cousin  Heart disease Neg Hx    Colon cancer Neg Hx    Allergic rhinitis Neg Hx    Angioedema Neg Hx    Asthma Neg Hx    Atopy Neg Hx    Eczema Neg Hx    Immunodeficiency Neg Hx    Urticaria Neg Hx      Prior to Admission medications   Medication Sig Start Date End Date Taking? Authorizing Provider  acetaminophen  (TYLENOL ) 325 MG tablet Take 650 mg by mouth every 6 (six) hours as needed.    [provider]  acyclovir  (ZOVIRAX ) 200 MG capsule Take 1 capsule (200 mg total) by mouth 2 (two) times daily. 04/19/24   Tysinger, Alm RAMAN, PA-C  albuterol  (PROVENTIL ) (2.5 MG/3ML) 0.083% nebulizer solution Take 3 mLs (2.5 mg total) by nebulization every 6 (six) hours as needed for wheezing or shortness of breath. 11/06/23   Tysinger, Alm RAMAN, PA-C  albuterol   (VENTOLIN  HFA) 108 (90 Base) MCG/ACT inhaler Inhale 2 puffs into the lungs every 6 (six) hours as needed for wheezing or shortness of breath. 11/06/23   Tysinger, Alm RAMAN, PA-C  allopurinol  (ZYLOPRIM ) 100 MG tablet Take 1 tablet by mouth once daily 12/26/23   Tysinger, Alm RAMAN, PA-C  amLODipine  (NORVASC ) 10 MG tablet Take 1 tablet (10 mg total) by mouth daily to lower blood pressure. 12/19/23   Tysinger, Alm RAMAN, PA-C  ascorbic acid  (VITAMIN C) 500 MG tablet Take 500 mg by mouth daily.    [provider]  aspirin  (EQ ASPIRIN  ADULT LOW DOSE) 81 MG EC tablet Take 1 tablet (81 mg total) by mouth daily. Swallow whole. 02/03/18   Tysinger, Alm RAMAN, PA-C  atorvastatin  (LIPITOR) 40 MG tablet Take 1 tablet (40 mg total) by mouth daily. 10/27/23   Tysinger, Alm RAMAN, PA-C  colchicine  0.6 MG tablet Take 1 tablet (0.6 mg total) by mouth daily as needed (gout flare). 12/19/23   Tysinger, Alm RAMAN, PA-C  dextromethorphan-guaiFENesin  (ROBITUSSIN-DM) 10-100 MG/5ML liquid Take 10 mLs by mouth every 4 (four) hours as needed for cough.    [provider]  ferrous sulfate  325 (65 FE) MG tablet Take 325 mg by mouth daily with breakfast.    [provider]  fluorouracil  (EFUDEX ) 5 % cream Apply topically 2 (two) times daily. 06/11/24 08/10/24  Lamount Ethan CROME, DPM  Fluticasone -Umeclidin-Vilant 200-62.5-25 MCG/ACT AEPB Inhale 1 puff into the lungs daily. Rinse mouth after each use. 04/30/23   Brien Belvie BRAVO, MD  ibuprofen  (ADVIL ) 800 MG tablet Take 1 tablet by mouth twice daily as needed 05/14/24   Tysinger, Alm RAMAN, PA-C  Insulin  Pen Needle (B-D ULTRAFINE III SHORT PEN) 31G X 8 MM MISC Use as needed. 12/26/22   Brien Belvie BRAVO, MD  metFORMIN  (GLUCOPHAGE ) 500 MG tablet Take 500 mg by mouth daily with breakfast.    [provider]  metoprolol  tartrate (LOPRESSOR ) 50 MG tablet Take 1 tablet by mouth twice daily 05/18/24   Tysinger, Alm RAMAN, PA-C  omeprazole  (PRILOSEC) 40 MG capsule Take 1 capsule (40  mg total) by mouth in the morning and at bedtime. 04/29/24   Tysinger, Alm RAMAN, PA-C  Semaglutide , 1 MG/DOSE, (OZEMPIC , 1 MG/DOSE,) 4 MG/3ML SOPN Inject 1 mg into the skin once a week. 05/14/24   Tysinger, Alm RAMAN, PA-C  Semaglutide , 2 MG/DOSE, (OZEMPIC , 2 MG/DOSE,) 8 MG/3ML SOPN INJECT 2 MG INTO THE SKIN ONCE A WEEK 05/31/24   Tysinger, Alm RAMAN, PA-C  spironolactone  (ALDACTONE ) 25 MG tablet Take 1 tablet (  25 mg total) by mouth daily. 05/14/24   Tysinger, Alm RAMAN, PA-C    Physical Exam: BP (!) 142/82 (BP Location: Right Arm)   Pulse 80   Temp 98 F (36.7 C) (Oral)   Resp 16   Ht 5' 11 (1.803 m)   Wt 114.8 kg   LMP 06/29/2015 (Exact Date)   SpO2 98%   BMI 35.29 kg/m  General:  Alert, oriented, calm, in no acute distress, she looks comfortable and nontoxic.  Her granddaughter is with her at the bedside. Cardiovascular: RRR, no murmurs or rubs, no peripheral edema  Respiratory: clear to auscultation bilaterally, no wheezes, no crackles  Abdomen: soft, nontender, nondistended, normal bowel tones heard  Skin: dry, no rashes, small tender area of erythema at the base of the left foot as pictured below Musculoskeletal: no joint effusions, normal range of motion  Psychiatric: appropriate affect, normal speech  Neurologic: extraocular muscles intact, clear speech, moving all extremities with intact sensorium           Labs on Admission:  Basic Metabolic Panel: Recent Labs  Lab 06/13/24 1305  NA 141  K 4.0  CL 100  CO2 28  GLUCOSE 89  BUN 11  CREATININE 0.74  CALCIUM  9.4   Liver Function Tests: Recent Labs  Lab 06/13/24 1305  AST 31  ALT 31  ALKPHOS 82  BILITOT 0.7  PROT 7.7  ALBUMIN  3.6   No results for input(s): LIPASE, AMYLASE in the last 168 hours. No results for input(s): AMMONIA in the last 168 hours. CBC: Recent Labs  Lab 06/13/24 1305  WBC 6.0  NEUTROABS 3.1  HGB 13.9  HCT 42.1  MCV 101.0*  PLT 225   Cardiac Enzymes: No results for input(s):  CKTOTAL, CKMB, CKMBINDEX, TROPONINI in the last 168 hours. BNP (last 3 results) No results for input(s): BNP in the last 8760 hours.  ProBNP (last 3 results) No results for input(s): PROBNP in the last 8760 hours.  CBG: No results for input(s): GLUCAP in the last 168 hours.  Radiological Exams on Admission: DG Foot Complete Left Result Date: 06/13/2024 EXAM: 3 or more VIEW(S) XRAY OF THE LEFT FOOT 06/13/2024 01:10:00 PM COMPARISON: None available. CLINICAL HISTORY: Concern for infection, diabetic. Per chart: Patient is diabetic and had callus scraped on left foot and podiatry told her to leave the medication on for 8 hours then wash it off and reapply. Patient reports pain has got worse where she can't walk on it. Lesion appears inflamed and possible cellulitis. FINDINGS: BONES AND JOINTS: No acute fracture, dislocation or focal bone erosion. Mild degenerative changes at the first MTP (metatarsophalangeal) joint. Plantar heel spurs. SOFT TISSUES: The soft tissues are unremarkable. IMPRESSION: 1. No acute fracture, dislocation, or focal bone erosion. 2. Mild degenerative changes at the first MTP joint. 3. Plantar heel spur. Electronically signed by: Waddell Calk MD 06/13/2024 01:37 PM EDT RP Workstation: [id number]   Assessment/Plan Sydney Williams is a 58 y.o. female with medical history significant for well-controlled type 2 diabetes, GERD, hypertension, hyperlipidemia and obesity being admitted to the hospital with concern for left foot cellulitis after plantar wart removal on 8/15.  Diabetic wound infection-after scraping of a plantar wart in the podiatry office on 8/15.  Patient with clinical evidence of cellulitis, elevated lactic acid but no sepsis. -Inpatient admission -Pain control -Empiric IV Unasyn  and IV linezolid  -Discussed with podiatry who will follow, and requests MRI  Type 2 diabetes-very well-controlled -Carb modified diet -Moderate dose sliding scale  Lactic  acidosis-without evidence of sepsis, could be due to recent albeit superficial surgical intervention although would not expect it to be this elevated 2 days later.  No evidence of sepsis otherwise. -Recheck lactic acid after hydration  GERD-Protonix   Hypertension-Aldactone  and metoprolol   DVT prophylaxis: Lovenox      Code Status: Full Code  Consults called: Dr. Tobie podiatry  Admission status: The appropriate patient status for this patient is INPATIENT. Inpatient status is judged to be reasonable and necessary in order to provide the required intensity of service to ensure the patient's safety. The patient's presenting symptoms, physical exam findings, and initial radiographic and laboratory data in the context of their chronic comorbidities is felt to place them at high risk for further clinical deterioration. Furthermore, it is not anticipated that the patient will be medically stable for discharge from the hospital within 2 midnights of admission.    I certify that at the point of admission it is my clinical judgment that the patient will require inpatient hospital care spanning beyond 2 midnights from the point of admission due to high intensity of service, high risk for further deterioration and high frequency of surveillance required  Time spent: 56 minutes  Crispin Vogel CHRISTELLA Gail MD [facility] Pager [phone]  If 7PM-7AM, please contact night-coverage [email or web address] Password [facility]  06/13/2024, 3:54 PM

## 2024-06-14 DIAGNOSIS — E11628 Type 2 diabetes mellitus with other skin complications: Secondary | ICD-10-CM | POA: Diagnosis not present

## 2024-06-14 DIAGNOSIS — L03116 Cellulitis of left lower limb: Secondary | ICD-10-CM

## 2024-06-14 DIAGNOSIS — L089 Local infection of the skin and subcutaneous tissue, unspecified: Secondary | ICD-10-CM | POA: Diagnosis not present

## 2024-06-14 DIAGNOSIS — B070 Plantar wart: Secondary | ICD-10-CM

## 2024-06-14 DIAGNOSIS — E11621 Type 2 diabetes mellitus with foot ulcer: Secondary | ICD-10-CM | POA: Diagnosis not present

## 2024-06-14 LAB — CBC
HCT: 39.7 % (ref 36.0–46.0)
Hemoglobin: 12.7 g/dL (ref 12.0–15.0)
MCH: 33.2 pg (ref 26.0–34.0)
MCHC: 32 g/dL (ref 30.0–36.0)
MCV: 103.9 fL — ABNORMAL HIGH (ref 80.0–100.0)
Platelets: 201 K/uL (ref 150–400)
RBC: 3.82 MIL/uL — ABNORMAL LOW (ref 3.87–5.11)
RDW: 14.8 % (ref 11.5–15.5)
WBC: 5.7 K/uL (ref 4.0–10.5)
nRBC: 0 % (ref 0.0–0.2)

## 2024-06-14 LAB — GLUCOSE, CAPILLARY
Glucose-Capillary: 93 mg/dL (ref 70–99)
Glucose-Capillary: 141 mg/dL — ABNORMAL HIGH (ref 70–99)

## 2024-06-14 LAB — BASIC METABOLIC PANEL WITH GFR
Anion gap: 12 (ref 5–15)
BUN: 8 mg/dL (ref 6–20)
CO2: 23 mmol/L (ref 22–32)
Calcium: 8.5 mg/dL — ABNORMAL LOW (ref 8.9–10.3)
Chloride: 105 mmol/L (ref 98–111)
Creatinine, Ser: 0.64 mg/dL (ref 0.44–1.00)
GFR, Estimated: >60 mL/min (ref >60)
Glucose, Bld: 122 mg/dL — ABNORMAL HIGH (ref 70–99)
Potassium: 3.5 mmol/L (ref 3.5–5.1)
Sodium: 140 mmol/L (ref 135–145)

## 2024-06-14 LAB — HIV ANTIBODY (ROUTINE TESTING W REFLEX): HIV Screen 4th Generation wRfx: NONREACTIVE

## 2024-06-14 MED ORDER — DOXYCYCLINE HYCLATE 100 MG PO TABS: 100.0000 mg | ORAL_TABLET | Freq: Two times a day (BID) | ORAL | 0 refills | Status: AC

## 2024-06-14 MED ORDER — AMLODIPINE BESYLATE 10 MG PO TABS
10.0000 mg | ORAL_TABLET | Freq: Every day | ORAL | Status: DC
  Administered 2024-06-14: 10 mg via ORAL
  Filled 2024-06-14: qty 1

## 2024-06-14 MED ORDER — AMOXICILLIN-POT CLAVULANATE 875-125 MG PO TABS: 1.0000 | ORAL_TABLET | Freq: Two times a day (BID) | ORAL | 0 refills | Status: AC

## 2024-06-14 MED ORDER — ATORVASTATIN CALCIUM 40 MG PO TABS
40.0000 mg | ORAL_TABLET | Freq: Every day | ORAL | Status: DC
  Administered 2024-06-14: 40 mg via ORAL
  Filled 2024-06-14: qty 1

## 2024-06-14 NOTE — Progress Notes (Signed)
 PROGRESS NOTE  [name]  FMW:[id number] DOB: [birth year] DOA: 06/13/2024 PCP: Bulah Alm RAMAN, PA-C   Brief Narrative: Sydney Williams is a 58 y.o. female with medical history significant for well-controlled type 2 diabetes, GERD, hypertension, hyperlipidemia and obesity being admitted to the hospital with concern for left foot cellulitis after plantar wart removal on 8/15.  She was seen by her podiatrist Dr. Lamount on 8/15 he performed bedside debridement in the office of a small callus on the medial portion of her right great toe, as well as scraped what he felt was a plantar wart on the bottom of her left foot.  She presented with significant pain and erythema on the bottom of her left foot.  No fever or chills.  Suspected to have cellulitis of the left foot.  Started on broad antibiotic.  MRI suggest local/focal cellulitis, no subcutaneous abscess, septic arthritis or osteomyelitis.  Podiatry also consulted.  Assessment & Plan:  Principal Problem:   Diabetic foot infection (HCC)   Left foot cellulitis: Status post scraping of the plantar wart in the podiatry office on 8/15.  Presented with pain, erythema of the left foot.  Currently on progressive antibiotics.  Follow-up blood cultures.  She is afebrile, no leukocytosis.MRI suggest local/focal cellulitis, no subcutaneous abscess, septic arthritis or osteomyelitis.  Podiatry also consulted.  Diabetes type 2: Well-controlled.  Currently on sliding scale.  GERD: Continue Protonix   Hypertension: Continue amlodipine , metoprolol   Leukocytosis: resolved       DVT prophylaxis:Lovenox      Code Status: Full Code  Family Communication: None at beside  Patient status:Inpatient  Patient is from :home  Anticipated discharge un:ynfz  Estimated DC date:1-2 days   Consultants: Podiatry  Procedures:None  Antimicrobials:  Anti-infectives (From admission, onward)    Start     Dose/Rate Route Frequency Ordered Stop   06/14/24 0330   linezolid  (ZYVOX ) IVPB 600 mg       Placed in And Linked Group   600 mg 300 mL/hr over 60 Minutes Intravenous  Once 06/13/24 1625 06/14/24 0414   06/13/24 2100  Ampicillin -Sulbactam (UNASYN ) 3 g in sodium chloride  0.9 % 100 mL IVPB       Placed in And Linked Group   3 g 200 mL/hr over 30 Minutes Intravenous Every 6 hours 06/13/24 1625     06/13/24 1415  Ampicillin -Sulbactam (UNASYN ) 3 g in sodium chloride  0.9 % 100 mL IVPB       Placed in And Linked Group   3 g 200 mL/hr over 30 Minutes Intravenous  Once 06/13/24 1410 06/13/24 1518   06/13/24 1415  linezolid  (ZYVOX ) IVPB 600 mg       Placed in And Linked Group   600 mg 300 mL/hr over 60 Minutes Intravenous  Once 06/13/24 1410 06/13/24 1630       Subjective: Patient seen and examined at bedside today.  Hemodynamically stable.  Comfortably sitting on the edge of the bed.  No fever or chills.  Pain is still persist on the plantar surface of the left foot.  Observed to have small area of subcutaneous abscess on the plantar aspect  of the right foot  Objective: Vitals:   06/13/24 1613 06/13/24 1708 06/13/24 2139 06/14/24 0640  BP: (!) 166/97 (!) 168/96 (!) 148/96 (!) 140/92  Pulse: 80 78 83 75  Resp: 16 17 15 16   Temp: 98 F (36.7 C) 97.6 F (36.4 C) 97.8 F (36.6 C) 97.9 F (36.6 C)  TempSrc: Oral Oral Oral Oral  SpO2: 97% 99% 97% 97%  Weight:      Height:        Intake/Output Summary (Last 24 hours) at 06/14/2024 0843 Last data filed at 06/14/2024 0600 Gross per 24 hour  Intake 1691.97 ml  Output --  Net 1691.97 ml   Filed Weights   06/13/24 1207  Weight: 114.8 kg    Examination:  General exam: Overall comfortable, not in distress,obese HEENT: PERRL Respiratory system:  no wheezes or crackles  Cardiovascular system: S1 & S2 heard, RRR.  Gastrointestinal system: Abdomen is nondistended, soft and nontender. Central nervous system: Alert and oriented Extremities: No edema, no clubbing ,no cyanosis.  Small  area of edema, purulent like material seen on the plantar surface of right foot Skin: No rashes, no ulcers,no icterus     Data Reviewed: I have personally reviewed following labs and imaging studies  CBC: Recent Labs  Lab 06/13/24 1305 06/14/24 0326  WBC 6.0 5.7  NEUTROABS 3.1  --   HGB 13.9 12.7  HCT 42.1 39.7  MCV 101.0* 103.9*  PLT 225 201   Basic Metabolic Panel: Recent Labs  Lab 06/13/24 1305 06/14/24 0326  NA 141 140  K 4.0 3.5  CL 100 105  CO2 28 23  GLUCOSE 89 122*  BUN 11 8  CREATININE 0.74 0.64  CALCIUM  9.4 8.5*     Recent Results (from the past 240 hours)  Aerobic Culture w Gram Stain (superficial specimen)     Status: None (Preliminary result)   Collection Time: 06/13/24  2:50 PM   Specimen: Wound  Result Value Ref Range Status   Specimen Description WOUND LEFT FOOT  Final   Special Requests NONE  Final   Gram Stain NO WBC SEEN NO ORGANISMS SEEN   Final   Culture   Final    NO GROWTH < 12 HOURS Performed at [facility], [address], [place], [place] [address]    Report Status PENDING  Incomplete     Radiology Studies: MR FOOT LEFT W WO CONTRAST Result Date: 06/13/2024 CLINICAL DATA:  Soft tissue infection. Skin blister noted on the plantar aspect of the foot medially. EXAM: MRI OF THE LEFT FOREFOOT WITHOUT AND WITH CONTRAST TECHNIQUE: Multiplanar, multisequence MR imaging of the left foot was performed both before and after administration of intravenous contrast. CONTRAST:  10mL GADAVIST  GADOBUTROL  1 MMOL/ML IV SOLN COMPARISON:  Radiographs, same date. FINDINGS: The bony structures are unremarkable. No stress fracture, bone lesion or evidence of osteomyelitis. No findings suspicious for septic arthritis. The tibiotalar, subtalar and midfoot joint spaces are maintained. No significant degenerative changes. No erosive findings. Skin blistering noted along the plantar and medial aspect of the hindfoot with some underlying subcutaneous  inflammations/edema and enhancement suggesting local/focal cellulitis. No subcutaneous abscess. No findings for myofasciitis or pyomyositis. Mild subcutaneous soft tissue swelling/edema noted around the ankle and visualized forefoot. The medial, lateral and anterior ankle tendons are intact and the medial and lateral ankle ligaments are intact. The Achilles tendon demonstrates mild to moderate tendinopathy with small interstitial tears. IMPRESSION: 1. Skin blistering along the plantar and medial aspect of the hindfoot with some underlying subcutaneous inflammation/edema and enhancement suggesting local/focal cellulitis. No subcutaneous abscess. 2. No findings for myofasciitis or pyomyositis. 3. No findings for septic arthritis or osteomyelitis. 4. Mild to moderate Achilles tendinopathy with small interstitial tears. Electronically Signed   By: MYRTIS Stammer M.D.   On: 06/13/2024 22:05   DG Foot Complete Left Result Date: 06/13/2024  EXAM: 3 or more VIEW(S) XRAY OF THE LEFT FOOT 06/13/2024 01:10:00 PM COMPARISON: None available. CLINICAL HISTORY: Concern for infection, diabetic. Per chart: Patient is diabetic and had callus scraped on left foot and podiatry told her to leave the medication on for 8 hours then wash it off and reapply. Patient reports pain has got worse where she can't walk on it. Lesion appears inflamed and possible cellulitis. FINDINGS: BONES AND JOINTS: No acute fracture, dislocation or focal bone erosion. Mild degenerative changes at the first MTP (metatarsophalangeal) joint. Plantar heel spurs. SOFT TISSUES: The soft tissues are unremarkable. IMPRESSION: 1. No acute fracture, dislocation, or focal bone erosion. 2. Mild degenerative changes at the first MTP joint. 3. Plantar heel spur. Electronically signed by: Taylor Stroud MD 06/13/2024 01:37 PM EDT RP Workstation: [id number]    Scheduled Meds:  enoxaparin  (LOVENOX ) injection  55 mg Subcutaneous QHS   insulin  aspart  0-15 Units  Subcutaneous TID WC   insulin  aspart  0-5 Units Subcutaneous QHS   metoprolol  tartrate  50 mg Oral BID   pantoprazole   40 mg Oral BID   spironolactone   25 mg Oral Daily   Continuous Infusions:  ampicillin -sulbactam (UNASYN ) IV 3 g (06/14/24 0814)     LOS: 1 day   Ivonne Mustache, MD [facility] P8/18/2025, 8:43 AM

## 2024-06-14 NOTE — Progress Notes (Signed)
 Orthopedic Tech Progress Note Patient Details:  Sydney Williams 06/19/1966 [id number] Applied post op shoe per order.  Ortho Devices Type of Ortho Device: Postop shoe/boot Ortho Device/Splint Location: LLE Ortho Device/Splint Interventions: Ordered, Application, Adjustment   Post Interventions Patient Tolerated: Well Instructions Provided: Adjustment of device, Care of device, Poper ambulation with device  Morna Pink 06/14/2024, 11:33 AM

## 2024-06-14 NOTE — Care Management Obs Status (Signed)
 MEDICARE OBSERVATION STATUS NOTIFICATION   Patient Details  Name: Sydney Williams MRN: [id number] Date of Birth: 12/01/1965   Medicare Observation Status Notification Given:  Yes    Alfonse JONELLE Rex, RN 06/14/2024, 1:11 PM

## 2024-06-14 NOTE — Consult Note (Signed)
 PODIATRY CONSULTATION  NAME Sydney Williams MRN [id number] DOB 1966-04-27 DOA 06/13/2024   Reason for consult:  Chief Complaint  Patient presents with   Foot Pain    Attending/Consulting physician: A. Adhikari MD  History of present illness: Sydney Williams is a 58 y.o. female with medical history significant for well-controlled type 2 diabetes, GERD, hypertension, hyperlipidemia and obesity being admitted to the hospital with concern for left foot cellulitis after plantar wart removal on 8/15.  She was seen by her podiatrist Dr. Lamount on 8/15 he performed bedside debridement in the office of a small callus on the medial portion of her right great toe, as well as scraped what he felt was a plantar wart on the bottom of her left foot.  She presented with significant pain and erythema on the bottom of her left foot.  No fever or chills.  Suspected to have cellulitis of the left foot.  Started on broad antibiotic.  MRI suggest local/focal cellulitis, no subcutaneous abscess, septic arthritis or osteomyelitis.  Podiatry also consulted.   Discussed with Dr. Lamount who saw the patient Friday - she had cantharidin applied to the left plantar heel wart. She reported significant pain and redness thereafter and presented to ED as a result .  Past Medical History:  Diagnosis Date   Asthma    2015 hospitaliation for asthma   Diabetes mellitus    Type II   Eczema 05/16/2022   Family history of breast cancer    Fibroids 2010   Genital herpes 11/30/2014   GERD (gastroesophageal reflux disease)    Hyperlipidemia    Hypertension    Migraines    Obesity    Right carpal tunnel syndrome    Smoker        Latest Ref Rng & Units 06/14/2024    3:26 AM 06/13/2024    1:05 PM 05/29/2024    5:42 AM  CBC  WBC 4.0 - 10.5 K/uL 5.7  6.0  8.3   Hemoglobin 12.0 - 15.0 g/dL 87.2  86.0  85.5   Hematocrit 36.0 - 46.0 % 39.7  42.1  42.7   Platelets 150 - 400 K/uL 201  225  261        Latest Ref Rng & Units  06/14/2024    3:26 AM 06/13/2024    1:05 PM 05/29/2024    5:42 AM  BMP  Glucose 70 - 99 mg/dL 877  89  867   BUN 6 - 20 mg/dL 8  11  9    Creatinine 0.44 - 1.00 mg/dL 9.35  9.25  9.44   Sodium 135 - 145 mmol/L 140  141  141   Potassium 3.5 - 5.1 mmol/L 3.5  4.0  3.6   Chloride 98 - 111 mmol/L 105  100  104   CO2 22 - 32 mmol/L 23  28  24    Calcium  8.9 - 10.3 mg/dL 8.5  9.4  9.1       Physical Exam: Lower Extremity Exam  Left DP and PT pulses 2+  Attention to left heel there is a raised macerated lesion consistent with verruca plantaris recently treated with cantharone - there is pain on palpation of the area as would be expected  Mild erythema surrounding vs inflammation from treatment no evidence of abscess or wound  Sensation intact to touch      ASSESSMENT/PLAN OF CARE 58 y.o. female with PMHx significant for  well-controlled type 2 diabetes, GERD, hypertension, hyperlipidemia and obesity  with inflammation and localized cellulitis s/p cantharidin application for treatment of left plantar heel wart. No evidence abscess/deep infection.  WBC 5.7  MRI L foot: 1. Skin blistering along the plantar and medial aspect of the hindfoot with some underlying subcutaneous inflammation/edema and enhancement suggesting local/focal cellulitis. No subcutaneous abscess. 2. No findings for myofasciitis or pyomyositis. 3. No findings for septic arthritis or osteomyelitis.  - No surgical intervention, her pain and redness is typical following cantharidin application and overall clinical appearance is what I would expect following that treatment. No abscess on MRI.  - Recommend discontinue use of flurouracil ointment as this may be irritating area excessively and instead used abx ointment and bandaid, daily dressing change. OK to wash foot prn.  - Recommend 7 days course PO abx broad spectrum doxycyline and augmentin  on discharge.  - Anticoagulation: ok to continue per primary - Wound care: ok to  perform daily dresing change and wash the area.  If it blisters, apply antibiotic ointment and a band-aid. Change daily - WB status: WBAT in post op shoe to LLE - ordered  -  pt stable for DC home from podiatry standpoint, follow up as previously scheduled with Dr. Lamount. Ok to be out of work until next week, work note PRN. Will sign off.   Thank you for the consult.  Please contact me directly with any questions or concerns.           Sydney Williams, DPM [facility] / [facility]    [address] [place], [place] [address]                Office [phone]  Fax [phone]

## 2024-06-14 NOTE — Progress Notes (Addendum)
   06/14/24 1159  TOC Brief Assessment  Insurance and Status Reviewed  Patient has primary care physician Yes  Home environment has been reviewed resides in an apartment  Prior level of function: Independent  Prior/Current Home Services No current home services  Social Drivers of Health Review SDOH reviewed no interventions necessary  Readmission risk has been reviewed Yes   -1:15pm Code 44 completed.

## 2024-06-14 NOTE — Care Management CC44 (Signed)
 Condition Code 44 Documentation Completed  Patient Details  Name: ANALYSSA DOWNS MRN: [id number] Date of Birth: 24-Dec-1965   Condition Code 44 given:  Yes Patient signature on Condition Code 44 notice:  Yes Documentation of 2 MD's agreement:  Yes Code 44 added to claim:  Yes    Alfonse JONELLE Rex, RN 06/14/2024, 1:11 PM

## 2024-06-14 NOTE — Discharge Summary (Signed)
 Physician Discharge Summary  MANASA SPEASE FMW:[id number] DOB: [birth year] DOA: 06/13/2024  PCP: Bulah Alm RAMAN, PA-C  Admit date: 06/13/2024 Discharge date: 06/14/2024  Admitted From: Home Disposition:  Home  Discharge Condition:Stable CODE STATUS:FULL, DNR, Comfort Care Diet recommendation: Heart Healthy / Carb Modified / Regular / Dysphagia   Brief/Interim Summary:   Following problems were addressed during the hospitalization:   Discharge Diagnoses:  Principal Problem:   Diabetic foot infection (HCC) Active Problems:   Plantar wart of left foot   Cellulitis of left lower extremity    Discharge Instructions  Discharge Instructions     Diet - low sodium heart healthy   Complete by: As directed    Discharge instructions   Complete by: As directed    1)please take your medications as instructed 2)Follow up with podiatry  as an outpatient   Increase activity slowly   Complete by: As directed    No wound care   Complete by: As directed       Allergies as of 06/14/2024       Reactions   Lisinopril  Swelling, Other (See Comments)   Ankles swell   Potassium Chloride  Shortness Of Breath, Swelling, Other (See Comments)   Tolerates IV KCl, reaction only to PO product   Other Other (See Comments)   Spring lettuce = mouth becomes numb   Pneumococcal Vaccines Swelling, Other (See Comments)   Swelling at injection site   Vicodin [hydrocodone-acetaminophen ] Itching, Rash        Medication List     STOP taking these medications    fluorouracil  5 % cream Commonly known as: Efudex        TAKE these medications    acetaminophen  325 MG tablet Commonly known as: TYLENOL  Take 650 mg by mouth every 6 (six) hours as needed (for pain or headaches).   acyclovir  200 MG capsule Commonly known as: ZOVIRAX  Take 1 capsule (200 mg total) by mouth 2 (two) times daily.   albuterol  (2.5 MG/3ML) 0.083% nebulizer solution Commonly known as: PROVENTIL  Take 3 mLs (2.5 mg  total) by nebulization every 6 (six) hours as needed for wheezing or shortness of breath.   albuterol  108 (90 Base) MCG/ACT inhaler Commonly known as: VENTOLIN  HFA Inhale 2 puffs into the lungs every 6 (six) hours as needed for wheezing or shortness of breath.   allopurinol  100 MG tablet Commonly known as: ZYLOPRIM  Take 1 tablet by mouth once daily   amLODipine  10 MG tablet Commonly known as: NORVASC  Take 1 tablet (10 mg total) by mouth daily to lower blood pressure. What changed: when to take this   ascorbic acid  500 MG tablet Commonly known as: VITAMIN C Take 500 mg by mouth daily.   aspirin  EC 81 MG tablet Commonly known as: EQ Aspirin  Adult Low Dose Take 1 tablet (81 mg total) by mouth daily. Swallow whole.   atorvastatin  40 MG tablet Commonly known as: LIPITOR Take 1 tablet (40 mg total) by mouth daily.   B-D ULTRAFINE III SHORT PEN 31G X 8 MM Misc Generic drug: Insulin  Pen Needle Use as needed.   colchicine  0.6 MG tablet Take 1 tablet (0.6 mg total) by mouth daily as needed (gout flare). What changed: when to take this   ferrous sulfate  325 (65 FE) MG tablet Take 325 mg by mouth daily with breakfast.   ibuprofen  800 MG tablet Commonly known as: ADVIL  Take 1 tablet by mouth twice daily as needed What changed: reasons to take this   metFORMIN  500  MG tablet Commonly known as: GLUCOPHAGE  Take 500 mg by mouth in the morning and at bedtime.   metoprolol  tartrate 50 MG tablet Commonly known as: LOPRESSOR  Take 1 tablet by mouth twice daily What changed:  when to take this additional instructions   omeprazole  40 MG capsule Commonly known as: PRILOSEC Take 1 capsule (40 mg total) by mouth in the morning and at bedtime. What changed:  when to take this reasons to take this   Ozempic  (1 MG/DOSE) 4 MG/3ML Sopn Generic drug: Semaglutide  (1 MG/DOSE) Inject 1 mg into the skin once a week. What changed: Another medication with the same name was changed. Make sure  you understand how and when to take each.   Ozempic  (2 MG/DOSE) 8 MG/3ML Sopn Generic drug: Semaglutide  (2 MG/DOSE) INJECT 2 MG INTO THE SKIN ONCE A WEEK What changed: See the new instructions.   spironolactone  25 MG tablet Commonly known as: ALDACTONE  Take 1 tablet (25 mg total) by mouth daily.   Trelegy Ellipta  200-62.5-25 MCG/ACT Aepb Generic drug: Fluticasone -Umeclidin-Vilant Inhale 1 puff into the lungs daily. Rinse mouth after each use.   Vitamin D -3 125 MCG (5000 UT) Tabs Take 5,000 Units by mouth daily.        Follow-up Information     Tysinger, Alm RAMAN, PA-C. Schedule an appointment as soon as possible for a visit in 1 week(s).   Specialty: Family Medicine Contact information: [address] [place] [address] [phone]                Allergies  Allergen Reactions   Lisinopril  Swelling and Other (See Comments)    Ankles swell   Potassium Chloride  Shortness Of Breath, Swelling and Other (See Comments)    Tolerates IV KCl, reaction only to PO product   Other Other (See Comments)    Spring lettuce = mouth becomes numb   Pneumococcal Vaccines Swelling and Other (See Comments)    Swelling at injection site   Vicodin [Hydrocodone-Acetaminophen ] Itching and Rash    Consultations:    Procedures/Studies: MR FOOT LEFT W WO CONTRAST Result Date: 06/13/2024 CLINICAL DATA:  Soft tissue infection. Skin blister noted on the plantar aspect of the foot medially. EXAM: MRI OF THE LEFT FOREFOOT WITHOUT AND WITH CONTRAST TECHNIQUE: Multiplanar, multisequence MR imaging of the left foot was performed both before and after administration of intravenous contrast. CONTRAST:  10mL GADAVIST  GADOBUTROL  1 MMOL/ML IV SOLN COMPARISON:  Radiographs, same date. FINDINGS: The bony structures are unremarkable. No stress fracture, bone lesion or evidence of osteomyelitis. No findings suspicious for septic arthritis. The tibiotalar, subtalar and midfoot joint spaces are  maintained. No significant degenerative changes. No erosive findings. Skin blistering noted along the plantar and medial aspect of the hindfoot with some underlying subcutaneous inflammations/edema and enhancement suggesting local/focal cellulitis. No subcutaneous abscess. No findings for myofasciitis or pyomyositis. Mild subcutaneous soft tissue swelling/edema noted around the ankle and visualized forefoot. The medial, lateral and anterior ankle tendons are intact and the medial and lateral ankle ligaments are intact. The Achilles tendon demonstrates mild to moderate tendinopathy with small interstitial tears. IMPRESSION: 1. Skin blistering along the plantar and medial aspect of the hindfoot with some underlying subcutaneous inflammation/edema and enhancement suggesting local/focal cellulitis. No subcutaneous abscess. 2. No findings for myofasciitis or pyomyositis. 3. No findings for septic arthritis or osteomyelitis. 4. Mild to moderate Achilles tendinopathy with small interstitial tears. Electronically Signed   By: MYRTIS Stammer M.D.   On: 06/13/2024 22:05   DG Foot  Complete Left Result Date: 06/13/2024 EXAM: 3 or more VIEW(S) XRAY OF THE LEFT FOOT 06/13/2024 01:10:00 PM COMPARISON: None available. CLINICAL HISTORY: Concern for infection, diabetic. Per chart: Patient is diabetic and had callus scraped on left foot and podiatry told her to leave the medication on for 8 hours then wash it off and reapply. Patient reports pain has got worse where she can't walk on it. Lesion appears inflamed and possible cellulitis. FINDINGS: BONES AND JOINTS: No acute fracture, dislocation or focal bone erosion. Mild degenerative changes at the first MTP (metatarsophalangeal) joint. Plantar heel spurs. SOFT TISSUES: The soft tissues are unremarkable. IMPRESSION: 1. No acute fracture, dislocation, or focal bone erosion. 2. Mild degenerative changes at the first MTP joint. 3. Plantar heel spur. Electronically signed by: Waddell Calk MD 06/13/2024 01:37 PM EDT RP Workstation: [id number]   DG Chest Port 1 View Result Date: 05/29/2024 EXAM: 1 VIEW XRAY OF THE CHEST 05/29/2024 05:53:00 AM COMPARISON: 11/06/2023 CLINICAL HISTORY: 355200 Chest pain [id number]. Reason for exam: Patient c/o chest tightness. Patient report pain woke her up this morning. Patient report nausea, denies vomiting. Patient denies SOB. Patient c/o chest tightness. Patient report pain woke her up this morning. Patient report nausea, denies vomiting. Patient denies SOB. FINDINGS: LUNGS AND PLEURA: No focal pulmonary opacity. No pulmonary edema. No pleural effusion. No pneumothorax. HEART AND MEDIASTINUM: No acute abnormality of the cardiac and mediastinal silhouettes. Aortic atherosclerotic calcification. BONES AND SOFT TISSUES: No acute osseous abnormality. IMPRESSION: 1. No acute cardiopulmonary pathology related to the reported chest pain. Electronically signed by: Waddell Calk MD 05/29/2024 06:45 AM EDT RP Workstation: [id number]   NM HEPATOBILIARY Result Date: 05/28/2024 EXAM: NM HEPATOBILLARY SCAN 05/28/2024 01:22:04 PM TECHNIQUE: RADIOPHARMACEUTICAL: 5 mCi Tc-72m mebrofenin  (CHOLETEC ) Dynamic images of the abdomen and pelvis were obtained in the anterior projection for 1 hour after intravenous administration of radiopharmaceutical. Additional delayed images are obtained up to 4 hours. COMPARISON: None available. CLINICAL HISTORY: Abdominal pain. NM HIDA study; 5.0 mCi Tc99 Choletec  via IV @ 1100; NM HIDA Scan: abdominal pain, right upper quadrant abdominal pain. FINDINGS: Homogenous uptake within the liver. Normal clearance of the blood pool. Appropriate excretion into the biliary system. Activity is visualized in the small bowel. Activity is visualized in the gallbladder. IMPRESSION: 1. The common bile duct and cystic duct are patent. No acute cholecystitis. Electronically signed by: Norman Gatlin MD 05/28/2024 10:07 PM EDT RP Workstation: [id number]   MM 3D  SCREENING MAMMOGRAM BILATERAL BREAST Result Date: 05/27/2024 CLINICAL DATA:  Screening. EXAM: DIGITAL SCREENING BILATERAL MAMMOGRAM WITH TOMOSYNTHESIS AND CAD TECHNIQUE: Bilateral screening digital craniocaudal and mediolateral oblique mammograms were obtained. Bilateral screening digital breast tomosynthesis was performed. The images were evaluated with computer-aided detection. COMPARISON:  Previous exam(s). ACR Breast Density Category b: There are scattered areas of fibroglandular density. FINDINGS: There are no findings suspicious for malignancy. IMPRESSION: No mammographic evidence of malignancy. A result letter of this screening mammogram will be mailed directly to the patient. RECOMMENDATION: Screening mammogram in one year. (Code:SM-B-01Y) BI-RADS CATEGORY  1: Negative. Electronically Signed   By: Reyes Phi M.D.   On: 05/27/2024 16:05   ECHOCARDIOGRAM COMPLETE Result Date: 05/18/2024    ECHOCARDIOGRAM REPORT   Patient Name:   Sydney Williams Date of Exam: 05/18/2024 Medical Rec #:  [id number]     Height:       71.0 in Accession #:    [id number]    Weight:       253.2 lb Date of Birth:  [birth year]      BSA:          2.331 m Patient Age:    58 years      BP:           135/86 mmHg Patient Gender: F             HR:           73 bpm. Exam Location:  Outpatient Procedure: 2D Echo, Strain Analysis, Cardiac Doppler and Color Doppler (Both            Spectral and Color Flow Doppler were utilized during procedure). Indications:    Cardiomegaly I51.7  History:        Patient has prior history of Echocardiogram examinations, most                 recent 06/21/2017. Risk Factors:Hypertension, Dyslipidemia and                 Diabetes.  Sonographer:    Koleen Popper RDCS Referring Phys: [phone] DAVID S TYSINGER  Sonographer Comments: Global longitudinal strain was attempted. IMPRESSIONS  1. Left ventricular ejection fraction, by estimation, is 65 to 70%. The left ventricle has normal function. The left ventricle has no  regional wall motion abnormalities. There is moderate left ventricular hypertrophy. Left ventricular diastolic parameters are consistent with Grade I diastolic dysfunction (impaired relaxation). Elevated left atrial pressure. The average left ventricular global longitudinal strain is -17.8 %. The global longitudinal strain is normal.  2. Right ventricular systolic function is normal. The right ventricular size is normal.  3. The mitral valve is normal in structure. Trivial mitral valve regurgitation. No evidence of mitral stenosis.  4. The aortic valve is tricuspid. Aortic valve regurgitation is not visualized. Aortic valve sclerosis/calcification is present, without any evidence of aortic stenosis.  5. Aortic dilatation noted. There is mild dilatation of the ascending aorta, measuring 41 mm.  6. The inferior vena cava is normal in size with greater than 50% respiratory variability, suggesting right atrial pressure of 3 mmHg. FINDINGS  Left Ventricle: Left ventricular ejection fraction, by estimation, is 65 to 70%. The left ventricle has normal function. The left ventricle has no regional wall motion abnormalities. The average left ventricular global longitudinal strain is -17.8 %. Strain was performed and the global longitudinal strain is normal. The left ventricular internal cavity size was normal in size. There is moderate left ventricular hypertrophy. Left ventricular diastolic parameters are consistent with Grade I diastolic dysfunction (impaired relaxation). Elevated left atrial pressure. Right Ventricle: The right ventricular size is normal. Right ventricular systolic function is normal. Left Atrium: Left atrial size was normal in size. Right Atrium: Right atrial size was normal in size. Pericardium: There is no evidence of pericardial effusion. Mitral Valve: The mitral valve is normal in structure. Mild mitral annular calcification. Trivial mitral valve regurgitation. No evidence of mitral valve stenosis.  Tricuspid Valve: The tricuspid valve is normal in structure. Tricuspid valve regurgitation is not demonstrated. No evidence of tricuspid stenosis. Aortic Valve: The aortic valve is tricuspid. Aortic valve regurgitation is not visualized. Aortic valve sclerosis/calcification is present, without any evidence of aortic stenosis. Pulmonic Valve: The pulmonic valve was normal in structure. Pulmonic valve regurgitation is trivial. No evidence of pulmonic stenosis. Aorta: Aortic dilatation noted. There is mild dilatation of the ascending aorta, measuring 41 mm. Venous: The inferior vena cava is normal in size with greater than 50% respiratory variability, suggesting right atrial pressure of 3 mmHg. IAS/Shunts:  No atrial level shunt detected by color flow Doppler.  LEFT VENTRICLE PLAX 2D LVIDd:         4.20 cm   Diastology LVIDs:         2.70 cm   LV e' medial:    4.57 cm/s LV PW:         1.30 cm   LV E/e' medial:  15.2 LV IVS:        1.40 cm   LV e' lateral:   6.31 cm/s LVOT diam:     2.10 cm   LV E/e' lateral: 11.0 LV SV:         78 LV SV Index:   33        2D Longitudinal Strain LVOT Area:     3.46 cm  2D Strain GLS Avg:     -17.8 %  RIGHT VENTRICLE             IVC RV Basal diam:  3.90 cm     IVC diam: 1.50 cm RV S prime:     13.50 cm/s TAPSE (M-mode): 2.7 cm LEFT ATRIUM           Index        RIGHT ATRIUM           Index LA diam:      3.20 cm 1.37 cm/m   RA Area:     16.60 cm LA Vol (A2C): 29.5 ml 12.66 ml/m  RA Volume:   42.00 ml  18.02 ml/m LA Vol (A4C): 43.0 ml 18.45 ml/m  AORTIC VALVE LVOT Vmax:   104.00 cm/s LVOT Vmean:  67.100 cm/s LVOT VTI:    0.225 m  AORTA Ao Root diam: 3.60 cm Ao Asc diam:  4.10 cm MITRAL VALVE MV Area (PHT): 3.19 cm    SHUNTS MV Decel Time: 238 msec    Systemic VTI:  0.22 m MV E velocity: 69.40 cm/s  Systemic Diam: 2.10 cm MV A velocity: 86.20 cm/s MV E/A ratio:  0.81 Redell Shallow MD Electronically signed by Redell Shallow MD Signature Date/Time: 05/18/2024/11:16:56 AM    Final        Subjective:   Discharge Exam: Vitals:   06/13/24 2139 06/14/24 0640  BP: (!) 148/96 (!) 140/92  Pulse: 83 75  Resp: 15 16  Temp: 97.8 F (36.6 C) 97.9 F (36.6 C)  SpO2: 97% 97%   Vitals:   06/13/24 1613 06/13/24 1708 06/13/24 2139 06/14/24 0640  BP: (!) 166/97 (!) 168/96 (!) 148/96 (!) 140/92  Pulse: 80 78 83 75  Resp: 16 17 15 16   Temp: 98 F (36.7 C) 97.6 F (36.4 C) 97.8 F (36.6 C) 97.9 F (36.6 C)  TempSrc: Oral Oral Oral Oral  SpO2: 97% 99% 97% 97%  Weight:      Height:        General: Pt is alert, awake, not in acute distress Cardiovascular: RRR, S1/S2 +, no rubs, no gallops Respiratory: CTA bilaterally, no wheezing, no rhonchi Abdominal: Soft, NT, ND, bowel sounds + Extremities: no edema, no cyanosis    The results of significant diagnostics from this hospitalization (including imaging, microbiology, ancillary and laboratory) are listed below for reference.     Microbiology: Recent Results (from the past 240 hours)  Blood culture (routine x 2)     Status: None (Preliminary result)   Collection Time: 06/13/24  1:05 PM   Specimen: BLOOD  Result Value Ref Range Status   Specimen Description   Final  BLOOD LEFT ANTECUBITAL Performed at [facility], [address], [place], [place] [address]    Special Requests   Final    BOTTLES DRAWN AEROBIC AND ANAEROBIC Blood Culture adequate volume Performed at [facility], [address] [place], [place] [address]    Culture   Final    NO GROWTH < 24 HOURS Performed at [facility], [address], [place], [place] [address]    Report Status PENDING  Incomplete  Blood culture (routine x 2)     Status: None (Preliminary result)   Collection Time: 06/13/24  1:25 PM   Specimen: BLOOD  Result Value Ref Range Status   Specimen Description   Final    BLOOD BLOOD RIGHT FOREARM Performed at [facility], [address], [place],  [place] [address]    Special Requests   Final    BOTTLES DRAWN AEROBIC AND ANAEROBIC Blood Culture results may not be optimal due to an inadequate volume of blood received in culture bottles Performed at [facility], [address] [place], [place] [address]    Culture   Final    NO GROWTH < 24 HOURS Performed at [facility], [address] [place], [place] [address]    Report Status PENDING  Incomplete  Aerobic Culture w Gram Stain (superficial specimen)     Status: None (Preliminary result)   Collection Time: 06/13/24  2:50 PM   Specimen: Wound  Result Value Ref Range Status   Specimen Description WOUND LEFT FOOT  Final   Special Requests NONE  Final   Gram Stain NO WBC SEEN NO ORGANISMS SEEN   Final   Culture   Final    NO GROWTH < 12 HOURS Performed at [facility], [address], [place], [place] [address]    Report Status PENDING  Incomplete     Labs: BNP (last 3 results) No results for input(s): BNP in the last 8760 hours. Basic Metabolic Panel: Recent Labs  Lab 06/13/24 1305 06/14/24 0326  NA 141 140  K 4.0 3.5  CL 100 105  CO2 28 23  GLUCOSE 89 122*  BUN 11 8  CREATININE 0.74 0.64  CALCIUM  9.4 8.5*   Liver Function Tests: Recent Labs  Lab 06/13/24 1305  AST 31  ALT 31  ALKPHOS 82  BILITOT 0.7  PROT 7.7  ALBUMIN  3.6   No results for input(s): LIPASE, AMYLASE in the last 168 hours. No results for input(s): AMMONIA in the last 168 hours. CBC: Recent Labs  Lab 06/13/24 1305 06/14/24 0326  WBC 6.0 5.7  NEUTROABS 3.1  --   HGB 13.9 12.7  HCT 42.1 39.7  MCV 101.0* 103.9*  PLT 225 201   Cardiac Enzymes: No results for input(s): CKTOTAL, CKMB, CKMBINDEX, TROPONINI in the last 168 hours. BNP: Invalid input(s): POCBNP CBG: Recent Labs  Lab 06/13/24 1755 06/13/24 2140 06/14/24 0739 06/14/24 1221  GLUCAP 124* 102* 93 141*   D-Dimer No results for input(s): DDIMER in the last 72 hours. Hgb  A1c No results for input(s): HGBA1C in the last 72 hours. Lipid Profile No results for input(s): CHOL, HDL, LDLCALC, TRIG, CHOLHDL, LDLDIRECT in the last 72 hours. Thyroid function studies No results for input(s): TSH, T4TOTAL, T3FREE, THYROIDAB in the last 72 hours.  Invalid input(s): FREET3 Anemia work up No results for input(s): VITAMINB12, FOLATE, FERRITIN, TIBC, IRON, RETICCTPCT in the last 72 hours. Urinalysis  Component Value Date/Time   COLORURINE YELLOW 10/18/2020 0844   APPEARANCEUR HAZY (A) 10/18/2020 0844   LABSPEC 1.010 10/27/2023 0905   PHURINE 5.0 10/18/2020 0844   GLUCOSEU NEGATIVE 10/18/2020 0844   HGBUR NEGATIVE 10/18/2020 0844   BILIRUBINUR negative 10/27/2023 0905   BILIRUBINUR neg 01/08/2017 1346   KETONESUR negative 10/27/2023 0905   KETONESUR NEGATIVE 10/18/2020 0844   PROTEINUR negative 10/27/2023 0905   PROTEINUR NEGATIVE 10/18/2020 0844   UROBILINOGEN negative 01/08/2017 1346   UROBILINOGEN 0.2 07/24/2015 1841   NITRITE Negative 10/27/2023 0905   NITRITE NEGATIVE 10/18/2020 0844   LEUKOCYTESUR Negative 10/27/2023 0905   LEUKOCYTESUR NEGATIVE 10/18/2020 0844   Sepsis Labs Recent Labs  Lab 06/13/24 1305 06/14/24 0326  WBC 6.0 5.7   Microbiology Recent Results (from the past 240 hours)  Blood culture (routine x 2)     Status: None (Preliminary result)   Collection Time: 06/13/24  1:05 PM   Specimen: BLOOD  Result Value Ref Range Status   Specimen Description   Final    BLOOD LEFT ANTECUBITAL Performed at [facility], [address], [place], [place] [address]    Special Requests   Final    BOTTLES DRAWN AEROBIC AND ANAEROBIC Blood Culture adequate volume Performed at [facility], [address] [place], [place] [address]    Culture   Final    NO GROWTH < 24 HOURS Performed at [facility], [address] [place], [place] [address]    Report Status  PENDING  Incomplete  Blood culture (routine x 2)     Status: None (Preliminary result)   Collection Time: 06/13/24  1:25 PM   Specimen: BLOOD  Result Value Ref Range Status   Specimen Description   Final    BLOOD BLOOD RIGHT FOREARM Performed at [facility], [address], [place], [place] [address]    Special Requests   Final    BOTTLES DRAWN AEROBIC AND ANAEROBIC Blood Culture results may not be optimal due to an inadequate volume of blood received in culture bottles Performed at [facility], [address] [place], [place] [address]    Culture   Final    NO GROWTH < 24 HOURS Performed at [facility], [address] [place], [place] [address]    Report Status PENDING  Incomplete  Aerobic Culture w Gram Stain (superficial specimen)     Status: None (Preliminary result)   Collection Time: 06/13/24  2:50 PM   Specimen: Wound  Result Value Ref Range Status   Specimen Description WOUND LEFT FOOT  Final   Special Requests NONE  Final   Gram Stain NO WBC SEEN NO ORGANISMS SEEN   Final   Culture   Final    NO GROWTH < 12 HOURS Performed at [facility], [address], [place], [place] [address]    Report Status PENDING  Incomplete    Please note: You were cared for by a hospitalist during your hospital stay. Once you are discharged, your primary care physician will handle any further medical issues. Please note that NO REFILLS for any discharge medications will be authorized once you are discharged, as it is imperative that you return to your primary care physician (or establish a relationship with a primary care physician if you do not have one) for your post hospital discharge needs so that they can reassess your need for medications and monitor your lab values.  Time coordinating discharge: 40 minutes  SIGNED:   Ivonne Mustache, MD  [facility] 06/14/2024, 12:46 PM Pager [phone]  If 7PM-7AM, please  contact night-coverage [email or web address] Password [email or web address]

## 2024-06-14 NOTE — Plan of Care (Signed)
   Problem: Coping: Goal: Ability to adjust to condition or change in health will improve Outcome: Progressing   Problem: Pain Managment: Goal: General experience of comfort will improve and/or be controlled Outcome: Progressing

## 2024-06-14 NOTE — Discharge Instructions (Signed)
 Take dressing off in 8 hours and wash the foot with soap and water. If it is hurting or becomes uncomfortable before the 8 hours, go ahead and remove the bandage and wash the area.  If it blisters, apply antibiotic ointment and a band-aid.  Monitor for any signs/symptoms of infection. Call the office immediately if any occur or go directly to the emergency room. Call with any questions/concerns.

## 2024-06-15 ENCOUNTER — Telehealth: Payer: Self-pay

## 2024-06-15 ENCOUNTER — Other Ambulatory Visit: Payer: Self-pay | Admitting: Medical

## 2024-06-15 ENCOUNTER — Telehealth: Payer: Self-pay | Admitting: Lab

## 2024-06-15 DIAGNOSIS — M109 Gout, unspecified: Secondary | ICD-10-CM

## 2024-06-15 NOTE — Telephone Encounter (Signed)
 Per Dr. Malvin, out of work note available in chart - patient will come by the [facility] office to pick up - please print for her. Thanks

## 2024-06-15 NOTE — Transitions of Care (Post Inpatient/ED Visit) (Signed)
 06/15/2024  Name: Sydney Williams MRN: [id number] DOB: [birth year]  Today's TOC FU Call Status: Today's TOC FU Call Status:: Successful TOC FU Call Completed TOC FU Call Complete Date: 06/15/24 Patient's Name and Date of Birth confirmed.  Transition Care Management Follow-up Telephone Call Date of Discharge: 06/14/24 Discharge Facility: [facility]) Type of Discharge: Inpatient Admission Primary Inpatient Discharge Diagnosis:: cellulitis How have you been since you were released from the hospital?: Better Any questions or concerns?: No  Items Reviewed: Medications obtained,verified, and reconciled?: Yes (Medications Reviewed) Any new allergies since your discharge?: No Dietary orders reviewed?: Yes Do you have support at home?: Yes People in Home [RPT]: child(ren), adult  Medications Reviewed Today: Medications Reviewed Today     Reviewed by Emmitt Pan, LPN (Licensed Practical Nurse) on 06/15/24 at 1457  Med List Status: <None>   Medication Order Taking? Sig Documenting Provider Last Dose Status Informant  acetaminophen  (TYLENOL ) 325 MG tablet [id number] Yes Take 650 mg by mouth every 6 (six) hours as needed (for pain or headaches). [provider]  Active Self           Med Note MARISA, NATHANEL LOISE Repress Jun 13, 2024  6:47 PM)    acyclovir  (ZOVIRAX ) 200 MG capsule [id number] Yes Take 1 capsule (200 mg total) by mouth 2 (two) times daily. Tysinger, Alm RAMAN, PA-C  Active Self  albuterol  (PROVENTIL ) (2.5 MG/3ML) 0.083% nebulizer solution [id number] Yes Take 3 mLs (2.5 mg total) by nebulization every 6 (six) hours as needed for wheezing or shortness of breath. Tysinger, Alm RAMAN, PA-C  Active Self  albuterol  (VENTOLIN  HFA) 108 (90 Base) MCG/ACT inhaler [id number] Yes Inhale 2 puffs into the lungs every 6 (six) hours as needed for wheezing or shortness of breath. Tysinger, Alm RAMAN, PA-C  Active Self  allopurinol  (ZYLOPRIM ) 100 MG tablet [id number] Yes Take 1 tablet by mouth once  daily Tysinger, Alm RAMAN, PA-C  Active Self  amLODipine  (NORVASC ) 10 MG tablet [id number] Yes Take 1 tablet (10 mg total) by mouth daily to lower blood pressure.  Patient taking differently: Take 10 mg by mouth in the morning and at bedtime.   Tysinger, Alm RAMAN, PA-C  Active Self  amoxicillin -clavulanate (AUGMENTIN ) 875-125 MG tablet [id number] Yes Take 1 tablet by mouth 2 (two) times daily for 7 days. Jillian Buttery, MD  Active   ascorbic acid  (VITAMIN C) 500 MG tablet [id number] Yes Take 500 mg by mouth daily. [provider]  Active Self  aspirin  (EQ ASPIRIN  ADULT LOW DOSE) 81 MG EC tablet [id number] Yes Take 1 tablet (81 mg total) by mouth daily. Swallow whole. Tysinger, Alm RAMAN, PA-C  Active Self  atorvastatin  (LIPITOR) 40 MG tablet [id number] Yes Take 1 tablet (40 mg total) by mouth daily. Tysinger, Alm RAMAN, PA-C  Active Self  Cholecalciferol  (VITAMIN D -3) 125 MCG (5000 UT) TABS [id number] Yes Take 5,000 Units by mouth daily. [provider]  Active Self  colchicine  0.6 MG tablet [id number] Yes Take 1 tablet (0.6 mg total) by mouth daily as needed (gout flare).  Patient taking differently: Take 0.6 mg by mouth daily.   Tysinger, Alm RAMAN, PA-C  Active Self  doxycycline  (VIBRA -TABS) 100 MG tablet [id number] Yes Take 1 tablet (100 mg total) by mouth 2 (two) times daily for 7 days. Jillian Buttery, MD  Active   ferrous sulfate  325 (65 FE) MG tablet [id number] Yes Take 325 mg by mouth daily with breakfast. [provider]  Active Self  Fluticasone -Umeclidin-Vilant  200-62.5-25 MCG/ACT AEPB [id number] Yes Inhale 1 puff into the lungs daily. Rinse mouth after each use. Brien Belvie BRAVO, MD  Active Self  ibuprofen  (ADVIL ) 800 MG tablet [id number] Yes Take 1 tablet by mouth twice daily as needed  Patient taking differently: Take 800 mg by mouth 2 (two) times daily as needed (for pain or headaches).   Tysinger, Alm RAMAN, PA-C  Active Self  Insulin  Pen Needle (B-D ULTRAFINE III SHORT PEN)  31G X 8 MM MISC [id number] Yes Use as needed. Brien Belvie BRAVO, MD  Active   metFORMIN  (GLUCOPHAGE ) 500 MG tablet [id number] Yes Take 500 mg by mouth in the morning and at bedtime. [provider]  Active Self  metoprolol  tartrate (LOPRESSOR ) 50 MG tablet [id number] Yes Take 1 tablet by mouth twice daily  Patient taking differently: Take 50 mg by mouth See admin instructions. Take 50 mg by mouth in the morning and evening   Tysinger, Alm RAMAN, PA-C  Active Self  omeprazole  (PRILOSEC) 40 MG capsule [id number] Yes Take 1 capsule (40 mg total) by mouth in the morning and at bedtime.  Patient taking differently: Take 40 mg by mouth 2 (two) times daily as needed (for reflux).   Tysinger, Alm RAMAN, PA-C  Active Self  Semaglutide , 1 MG/DOSE, (OZEMPIC , 1 MG/DOSE,) 4 MG/3ML SOPN [id number]  Inject 1 mg into the skin once a week.  Patient not taking: Reported on 06/15/2024   Bulah Alm RAMAN, PA-C  Active Self  Semaglutide , 2 MG/DOSE, (OZEMPIC , 2 MG/DOSE,) 8 MG/3ML SOPN [id number] Yes INJECT 2 MG INTO THE SKIN ONCE A WEEK  Patient taking differently: Inject 2 mg into the skin every Wednesday.   Tysinger, Alm RAMAN, PA-C  Active Self  spironolactone  (ALDACTONE ) 25 MG tablet [id number] Yes Take 1 tablet (25 mg total) by mouth daily. Tysinger, Alm RAMAN, PA-C  Active Self            Home Care and Equipment/Supplies: Were Home Health Services Ordered?: NA Any new equipment or medical supplies ordered?: NA  Functional Questionnaire: Do you need assistance with bathing/showering or dressing?: No Do you need assistance with meal preparation?: No Do you need assistance with eating?: No Do you have difficulty maintaining continence: No Do you need assistance with getting out of bed/getting out of a chair/moving?: No Do you have difficulty managing or taking your medications?: No  Follow up appointments reviewed: PCP Follow-up appointment confirmed?: Yes Date of PCP follow-up appointment?:  06/16/24 Follow-up Provider: [facility] Follow-up appointment confirmed?: NA Do you need transportation to your follow-up appointment?: No Do you understand care options if your condition(s) worsen?: Yes-patient verbalized understanding    SIGNATURE Julian Lemmings, LPN [facility] Nurse Health Advisor Direct Dial [phone]

## 2024-06-15 NOTE — Telephone Encounter (Signed)
 Patient left message stated released from hospital and is in need of pain medication.

## 2024-06-16 ENCOUNTER — Ambulatory Visit (INDEPENDENT_AMBULATORY_CARE_PROVIDER_SITE_OTHER): Admitting: Medical

## 2024-06-16 ENCOUNTER — Encounter: Payer: Self-pay | Admitting: Medical

## 2024-06-16 VITALS — BP 132/86 | HR 85 | Ht 71.0 in | Wt 249.0 lb

## 2024-06-16 DIAGNOSIS — L089 Local infection of the skin and subcutaneous tissue, unspecified: Secondary | ICD-10-CM

## 2024-06-16 DIAGNOSIS — M79672 Pain in left foot: Secondary | ICD-10-CM

## 2024-06-16 DIAGNOSIS — E11628 Type 2 diabetes mellitus with other skin complications: Secondary | ICD-10-CM

## 2024-06-16 DIAGNOSIS — B070 Plantar wart: Secondary | ICD-10-CM | POA: Diagnosis not present

## 2024-06-16 LAB — AEROBIC CULTURE W GRAM STAIN (SUPERFICIAL SPECIMEN)
Culture: NO GROWTH
Gram Stain: NONE SEEN

## 2024-06-16 MED ORDER — OXYCODONE-ACETAMINOPHEN 5-325 MG PO TABS: 1.0000 | ORAL_TABLET | Freq: Three times a day (TID) | ORAL | 0 refills | Status: DC | PRN

## 2024-06-16 NOTE — Progress Notes (Addendum)
 Subjective: Chief Complaint  Patient presents with   Hospitalization Follow-up    Here for hospital f/u  Admit date: 06/13/2024 Discharge date: 06/14/2024  Discharge Diagnoses:  Principal Problem:   Diabetic foot infection (HCC) Active Problems:   Plantar wart of left foot   Cellulitis of left lower extremity  Brief/Interim Summary from hospital discharge summary: Sydney Williams is a 58 y.o. female with medical history significant for well-controlled type 2 diabetes, GERD, hypertension, hyperlipidemia and obesity being admitted to the hospital with concern for left foot cellulitis after plantar wart removal on 8/15.  She was seen by her podiatrist Dr. Lamount on 8/15 he performed bedside debridement in the office of a small callus on the medial portion of her right great toe, as well as scraped what he felt was a plantar wart on the bottom of her left foot.  She presented with significant pain and erythema on the bottom of her left foot.  No fever or chills.  Suspected to have cellulitis of the left foot.  Started on broad antibiotic.  MRI suggest local/focal cellulitis, no subcutaneous abscess, septic arthritis or osteomyelitis.  Podiatry also consulted.  Dr. Malvin recommended her to be discharge on oral antibiotics and he will follow-up with him as an outpatient.  Medically stable for discharge.   Foot is hurting a lot, can't walk without pain.  Wearing post op shoe.   Sees foot doctor again 07/02/24.    Currently on doxycyline and Augmentin  from hospitalization.   Wasn't discharged with pain medication.  Today pain is 9/10.  Hurts to touch area or bear weight.  She is not sure about when to return to work.   No other aggravating or relieving factors. No other complaint.   Past Medical History:  Diagnosis Date   Asthma    2015 hospitaliation for asthma   Diabetes mellitus    Type II   Eczema 05/16/2022   Family history of breast cancer    Fibroids 2010   Genital herpes  11/30/2014   GERD (gastroesophageal reflux disease)    Hyperlipidemia    Hypertension    Migraines    Obesity    Right carpal tunnel syndrome    Smoker    Current Outpatient Medications on File Prior to Visit  Medication Sig Dispense Refill   acetaminophen  (TYLENOL ) 325 MG tablet Take 650 mg by mouth every 6 (six) hours as needed (for pain or headaches).     acyclovir  (ZOVIRAX ) 200 MG capsule Take 1 capsule (200 mg total) by mouth 2 (two) times daily. 180 capsule 0   albuterol  (PROVENTIL ) (2.5 MG/3ML) 0.083% nebulizer solution Take 3 mLs (2.5 mg total) by nebulization every 6 (six) hours as needed for wheezing or shortness of breath. 75 mL 2   albuterol  (VENTOLIN  HFA) 108 (90 Base) MCG/ACT inhaler Inhale 2 puffs into the lungs every 6 (six) hours as needed for wheezing or shortness of breath. 18 g 1   allopurinol  (ZYLOPRIM ) 100 MG tablet Take 1 tablet by mouth once daily 90 tablet 1   amLODipine  (NORVASC ) 10 MG tablet Take 1 tablet (10 mg total) by mouth daily to lower blood pressure. 90 tablet 2   amoxicillin -clavulanate (AUGMENTIN ) 875-125 MG tablet Take 1 tablet by mouth 2 (two) times daily for 7 days. 14 tablet 0   ascorbic acid  (VITAMIN C) 500 MG tablet Take 500 mg by mouth daily.     aspirin  (EQ ASPIRIN  ADULT LOW DOSE) 81 MG EC tablet Take 1 tablet (81  mg total) by mouth daily. Swallow whole. 90 tablet 3   atorvastatin  (LIPITOR) 40 MG tablet Take 1 tablet (40 mg total) by mouth daily. 90 tablet 2   Cholecalciferol  (VITAMIN D -3) 125 MCG (5000 UT) TABS Take 5,000 Units by mouth daily.     colchicine  0.6 MG tablet Take 1 tablet (0.6 mg total) by mouth daily as needed (gout flare). 30 tablet 2   doxycycline  (VIBRA -TABS) 100 MG tablet Take 1 tablet (100 mg total) by mouth 2 (two) times daily for 7 days. 14 tablet 0   ferrous sulfate  325 (65 FE) MG tablet Take 325 mg by mouth daily with breakfast.     Fluticasone -Umeclidin-Vilant 200-62.5-25 MCG/ACT AEPB Inhale 1 puff into the lungs daily.  Rinse mouth after each use. 60 each 4   ibuprofen  (ADVIL ) 800 MG tablet Take 1 tablet by mouth twice daily as needed 30 tablet 1   Insulin  Pen Needle (B-D ULTRAFINE III SHORT PEN) 31G X 8 MM MISC Use as needed. 100 each 2   metFORMIN  (GLUCOPHAGE ) 500 MG tablet Take 500 mg by mouth in the morning and at bedtime.     metoprolol  tartrate (LOPRESSOR ) 50 MG tablet Take 1 tablet by mouth twice daily 180 tablet 1   omeprazole  (PRILOSEC) 40 MG capsule Take 1 capsule (40 mg total) by mouth in the morning and at bedtime. 60 capsule 0   Semaglutide , 2 MG/DOSE, (OZEMPIC , 2 MG/DOSE,) 8 MG/3ML SOPN INJECT 2 MG INTO THE SKIN ONCE A WEEK 3 mL 2   spironolactone  (ALDACTONE ) 25 MG tablet Take 1 tablet (25 mg total) by mouth daily. 90 tablet 3   Semaglutide , 1 MG/DOSE, (OZEMPIC , 1 MG/DOSE,) 4 MG/3ML SOPN Inject 1 mg into the skin once a week. (Patient not taking: Reported on 06/15/2024) 3 mL 0   No current facility-administered medications on file prior to visit.   ROS as in subjective    Objective: BP 132/86 (BP Location: Right Arm, Patient Position: Sitting)   Pulse 85   Ht 5' 11 (1.803 m)   Wt 249 lb (112.9 kg)   LMP 06/29/2015 (Exact Date)   SpO2 98%   BMI 34.73 kg/m   Wt Readings from Last 3 Encounters:  06/16/24 249 lb (112.9 kg)  06/13/24 253 lb (114.8 kg)  05/29/24 253 lb (114.8 kg)   BP Readings from Last 3 Encounters:  06/16/24 132/86  06/14/24 (!) 140/92  05/29/24 (!) 151/90   Afebrile today Gen: wd, wn, nad, but notes pain Has post op shoe on left foot  Left volar foot medially with a roughly 2 cm yellowish-white raised area with central core from plantar wart but quite tender throughout the whole area as well as medial foot adjacent to the raised tender area.  No obvious erythema or warmth or pus.  No obvious fluctuance. Pulse within normal limits.  Sensation normal.  Ankle range of motion okay but then aggravates the pain at the site of the wound     Assessment: Encounter  Diagnoses  Name Primary?   Plantar wart of left foot Yes   Foot pain, left    Diabetic foot infection (HCC)     Plan I reviewed your hospital notes, discharge summary.    Medicaiton's reconciled.  Discussed current findings.    Recommendations: You can use either Tylenol  over-the-counter for mild pain or for worse pain you can use the Percocet prescribed today no more than 3 times a day or every 8 hours but preferably twice a day or  less as needed for pain. Elevate the foot and rest the foot when possible Finish out the 2 antibiotics doxycycline  and Augmentin  I will send my note to your podiatrist today in case they want to see you sooner or if they have other recommendations If any new or worse redness, worse swelling, worse pain, fever, body aches or chills then get reevaluated immediately Follow up with podiatry  Sydney Williams was seen today for hospitalization follow-up.  Diagnoses and all orders for this visit:  Plantar wart of left foot  Foot pain, left  Diabetic foot infection (HCC)    F/u with podiatry.

## 2024-06-16 NOTE — Patient Instructions (Signed)
  Recommendations: You can use either Tylenol  over-the-counter for mild pain or for worse pain you can use the Percocet prescribed today no more than 3 times a day or every 8 hours but preferably twice a day or less as needed for pain. Elevate the foot and rest the foot when possible Finish out the 2 antibiotics doxycycline  and Augmentin  I will send my note to your podiatrist today in case they want to see you sooner or if they have other recommendations If any new or worse redness, worse swelling, worse pain, fever, body aches or chills then get reevaluated immediately Follow up with podiatry

## 2024-06-18 ENCOUNTER — Encounter: Payer: Self-pay | Admitting: Podiatry

## 2024-06-18 ENCOUNTER — Ambulatory Visit: Admitting: Podiatry

## 2024-06-18 DIAGNOSIS — B070 Plantar wart: Secondary | ICD-10-CM

## 2024-06-18 LAB — CULTURE, BLOOD (ROUTINE X 2)
Culture: NO GROWTH
Culture: NO GROWTH
Special Requests: ADEQUATE

## 2024-06-18 MED ORDER — MELOXICAM 15 MG PO TABS: 15.0000 mg | ORAL_TABLET | Freq: Every day | ORAL | 0 refills | Status: DC

## 2024-06-18 NOTE — Progress Notes (Signed)
 Subjective:  Patient ID: Sydney Williams, female    DOB: 07/28/1966,  MRN: [id number]  Chief Complaint  Patient presents with   Foot Pain    Pt stated that she saw a Dr on 8/15 he treated her foot  but she is unable to walk on her foot now     58 y.o. female presents with the above complaint.  Patient presents with complaint of left foot pain.  She states that she had a reaction to this.  She had Cantharone medication applied by Dr. Lamount.  She states it hurts with ambulation wanted to discuss treatment options for it.  Is causing a lot of discomfort.  She has not seen and was prior to seeing me for this.   Review of Systems: Negative except as noted in the HPI. Denies N/V/F/Ch.  Past Medical History:  Diagnosis Date   Asthma    2015 hospitaliation for asthma   Diabetes mellitus    Type II   Eczema 05/16/2022   Family history of breast cancer    Fibroids 2010   Genital herpes 11/30/2014   GERD (gastroesophageal reflux disease)    Hyperlipidemia    Hypertension    Migraines    Obesity    Right carpal tunnel syndrome    Smoker     Current Outpatient Medications:    meloxicam  (MOBIC ) 15 MG tablet, Take 1 tablet (15 mg total) by mouth daily., Disp: 30 tablet, Rfl: 0   acetaminophen  (TYLENOL ) 325 MG tablet, Take 650 mg by mouth every 6 (six) hours as needed (for pain or headaches)., Disp: , Rfl:    acyclovir  (ZOVIRAX ) 200 MG capsule, Take 1 capsule (200 mg total) by mouth 2 (two) times daily., Disp: 180 capsule, Rfl: 0   albuterol  (PROVENTIL ) (2.5 MG/3ML) 0.083% nebulizer solution, Take 3 mLs (2.5 mg total) by nebulization every 6 (six) hours as needed for wheezing or shortness of breath., Disp: 75 mL, Rfl: 2   albuterol  (VENTOLIN  HFA) 108 (90 Base) MCG/ACT inhaler, Inhale 2 puffs into the lungs every 6 (six) hours as needed for wheezing or shortness of breath., Disp: 18 g, Rfl: 1   allopurinol  (ZYLOPRIM ) 100 MG tablet, Take 1 tablet by mouth once daily, Disp: 90 tablet, Rfl: 0    amLODipine  (NORVASC ) 10 MG tablet, Take 1 tablet (10 mg total) by mouth daily to lower blood pressure., Disp: 90 tablet, Rfl: 2   ascorbic acid  (VITAMIN C) 500 MG tablet, Take 500 mg by mouth daily., Disp: , Rfl:    aspirin  (EQ ASPIRIN  ADULT LOW DOSE) 81 MG EC tablet, Take 1 tablet (81 mg total) by mouth daily. Swallow whole., Disp: 90 tablet, Rfl: 3   atorvastatin  (LIPITOR) 40 MG tablet, Take 1 tablet (40 mg total) by mouth daily., Disp: 90 tablet, Rfl: 2   Cholecalciferol  (VITAMIN D -3) 125 MCG (5000 UT) TABS, Take 5,000 Units by mouth daily., Disp: , Rfl:    colchicine  0.6 MG tablet, TAKE 1 TABLET BY MOUTH ONCE DAILY AS NEEDED FOR  GOUT  FLARE, Disp: 30 tablet, Rfl: 0   ferrous sulfate  325 (65 FE) MG tablet, Take 325 mg by mouth daily with breakfast., Disp: , Rfl:    Fluticasone -Umeclidin-Vilant 200-62.5-25 MCG/ACT AEPB, Inhale 1 puff into the lungs daily. Rinse mouth after each use., Disp: 60 each, Rfl: 4   ibuprofen  (ADVIL ) 800 MG tablet, Take 1 tablet by mouth twice daily as needed, Disp: 30 tablet, Rfl: 1   Insulin  Pen Needle (B-D ULTRAFINE III SHORT  PEN) 31G X 8 MM MISC, Use as needed., Disp: 100 each, Rfl: 2   metFORMIN  (GLUCOPHAGE ) 500 MG tablet, Take 500 mg by mouth in the morning and at bedtime., Disp: , Rfl:    metoprolol  tartrate (LOPRESSOR ) 50 MG tablet, Take 1 tablet by mouth twice daily, Disp: 180 tablet, Rfl: 1   omeprazole  (PRILOSEC) 40 MG capsule, Take 1 capsule (40 mg total) by mouth in the morning and at bedtime., Disp: 60 capsule, Rfl: 0   oxyCODONE -acetaminophen  (PERCOCET) 5-325 MG tablet, Take 1 tablet by mouth every 8 (eight) hours as needed for severe pain (pain score 7-10)., Disp: 12 tablet, Rfl: 0   Semaglutide , 1 MG/DOSE, (OZEMPIC , 1 MG/DOSE,) 4 MG/3ML SOPN, Inject 1 mg into the skin once a week. (Patient not taking: Reported on 06/15/2024), Disp: 3 mL, Rfl: 0   Semaglutide , 2 MG/DOSE, (OZEMPIC , 2 MG/DOSE,) 8 MG/3ML SOPN, INJECT 2 MG INTO THE SKIN ONCE A WEEK, Disp: 3 mL,  Rfl: 2   spironolactone  (ALDACTONE ) 25 MG tablet, Take 1 tablet (25 mg total) by mouth daily., Disp: 90 tablet, Rfl: 3  Social History   Tobacco Use  Smoking Status Every Day   Current packs/day: 0.25   Average packs/day: 0.3 packs/day for 14.0 years (3.5 ttl pk-yrs)   Types: Cigarettes  Smokeless Tobacco Never    Allergies  Allergen Reactions   Lisinopril  Swelling and Other (See Comments)    Ankles swell   Potassium Chloride  Shortness Of Breath, Swelling and Other (See Comments)    Tolerates IV KCl, reaction only to PO product   Other Other (See Comments)    Spring lettuce = mouth becomes numb   Pneumococcal Vaccines Swelling and Other (See Comments)    Swelling at injection site   Vicodin [Hydrocodone-Acetaminophen ] Itching and Rash   Objective:  There were no vitals filed for this visit. There is no height or weight on file to calculate BMI. Constitutional Well developed. Well nourished.  Vascular Dorsalis pedis pulses palpable bilaterally. Posterior tibial pulses palpable bilaterally. Capillary refill normal to all digits.  No cyanosis or clubbing noted. Pedal hair growth normal.  Neurologic Normal speech. Oriented to person, place, and time. Epicritic sensation to light touch grossly present bilaterally.  Dermatologic Left foot blister formation noted secondary catheter reaction no signs of infection noted no deeper wound noted.  No abnormalities identified  Orthopedic: Normal joint ROM without pain or crepitus bilaterally. No visible deformities. No bony tenderness.   Radiographs: None Assessment:   1. Plantar wart of left foot    Plan:  Patient was evaluated and treated and all questions answered.  Left foot blister with a history of Cantharone - All questions and concerns were discussed with the patient in extensive detail at this time he seems to be improving a little bit.  For now we will hold off on Cantharone therapy.  If any foot and ankle issues in  the future she will come back to see me.  No follow-ups on file.

## 2024-07-06 ENCOUNTER — Ambulatory Visit: Admitting: Gastroenterology

## 2024-07-08 ENCOUNTER — Other Ambulatory Visit: Payer: Self-pay | Admitting: Podiatry

## 2024-07-08 ENCOUNTER — Other Ambulatory Visit: Payer: Self-pay | Admitting: Medical

## 2024-07-08 DIAGNOSIS — M109 Gout, unspecified: Secondary | ICD-10-CM

## 2024-07-09 ENCOUNTER — Ambulatory Visit (INDEPENDENT_AMBULATORY_CARE_PROVIDER_SITE_OTHER): Admitting: Podiatry

## 2024-07-09 DIAGNOSIS — Z01818 Encounter for other preprocedural examination: Secondary | ICD-10-CM

## 2024-07-09 DIAGNOSIS — L989 Disorder of the skin and subcutaneous tissue, unspecified: Secondary | ICD-10-CM | POA: Diagnosis not present

## 2024-07-09 NOTE — Progress Notes (Signed)
 Subjective:  Patient ID: Sydney Williams, female    DOB: 1966/09/25,  MRN: [id number]  Chief Complaint  Patient presents with   Plantar Warts    Pt stated that everything is doing better     58 y.o. female presents with the above complaint.  Patient presents with complaint of left foot pain.  She has failed Cantharone medication because she has a lot of reaction.  She would like to discuss surgical excision as she has failed conservative treatment options for now.  She states understanding would like to proceed with surgery she is a type II diabetic with last A1c of 5.9  Review of Systems: Negative except as noted in the HPI. Denies N/V/F/Ch.  Past Medical History:  Diagnosis Date   Asthma    2015 hospitaliation for asthma   Diabetes mellitus    Type II   Eczema 05/16/2022   Family history of breast cancer    Fibroids 2010   Genital herpes 11/30/2014   GERD (gastroesophageal reflux disease)    Hyperlipidemia    Hypertension    Migraines    Obesity    Right carpal tunnel syndrome    Smoker     Current Outpatient Medications:    acetaminophen  (TYLENOL ) 325 MG tablet, Take 650 mg by mouth every 6 (six) hours as needed (for pain or headaches)., Disp: , Rfl:    acyclovir  (ZOVIRAX ) 200 MG capsule, Take 1 capsule by mouth twice daily, Disp: 180 capsule, Rfl: 0   albuterol  (PROVENTIL ) (2.5 MG/3ML) 0.083% nebulizer solution, Take 3 mLs (2.5 mg total) by nebulization every 6 (six) hours as needed for wheezing or shortness of breath., Disp: 75 mL, Rfl: 2   albuterol  (VENTOLIN  HFA) 108 (90 Base) MCG/ACT inhaler, Inhale 2 puffs into the lungs every 6 (six) hours as needed for wheezing or shortness of breath., Disp: 18 g, Rfl: 1   allopurinol  (ZYLOPRIM ) 100 MG tablet, Take 1 tablet by mouth once daily, Disp: 90 tablet, Rfl: 0   amLODipine  (NORVASC ) 10 MG tablet, Take 1 tablet (10 mg total) by mouth daily to lower blood pressure., Disp: 90 tablet, Rfl: 2   ascorbic acid  (VITAMIN C) 500 MG  tablet, Take 500 mg by mouth daily., Disp: , Rfl:    aspirin  (EQ ASPIRIN  ADULT LOW DOSE) 81 MG EC tablet, Take 1 tablet (81 mg total) by mouth daily. Swallow whole., Disp: 90 tablet, Rfl: 3   atorvastatin  (LIPITOR) 40 MG tablet, Take 1 tablet (40 mg total) by mouth daily., Disp: 90 tablet, Rfl: 2   Cholecalciferol  (VITAMIN D -3) 125 MCG (5000 UT) TABS, Take 5,000 Units by mouth daily., Disp: , Rfl:    colchicine  0.6 MG tablet, TAKE 1 TABLET BY MOUTH ONCE DAILY AS  NEEDED  FOR  GOUT  FLARE, Disp: 30 tablet, Rfl: 0   ferrous sulfate  325 (65 FE) MG tablet, Take 325 mg by mouth daily with breakfast., Disp: , Rfl:    Fluticasone -Umeclidin-Vilant 200-62.5-25 MCG/ACT AEPB, Inhale 1 puff into the lungs daily. Rinse mouth after each use., Disp: 60 each, Rfl: 4   ibuprofen  (ADVIL ) 800 MG tablet, Take 1 tablet by mouth twice daily as needed, Disp: 30 tablet, Rfl: 1   Insulin  Pen Needle (B-D ULTRAFINE III SHORT PEN) 31G X 8 MM MISC, Use as needed., Disp: 100 each, Rfl: 2   meloxicam  (MOBIC ) 15 MG tablet, Take 1 tablet by mouth once daily, Disp: 30 tablet, Rfl: 0   metFORMIN  (GLUCOPHAGE ) 500 MG tablet, Take 500 mg by  mouth in the morning and at bedtime., Disp: , Rfl:    metoprolol  tartrate (LOPRESSOR ) 50 MG tablet, Take 1 tablet by mouth twice daily, Disp: 180 tablet, Rfl: 1   omeprazole  (PRILOSEC) 40 MG capsule, TAKE 1 CAPSULE BY MOUTH IN THE MORNING AND AT BEDTIME, Disp: 60 capsule, Rfl: 0   oxyCODONE -acetaminophen  (PERCOCET) 5-325 MG tablet, Take 1 tablet by mouth every 8 (eight) hours as needed for severe pain (pain score 7-10)., Disp: 12 tablet, Rfl: 0   Semaglutide , 1 MG/DOSE, (OZEMPIC , 1 MG/DOSE,) 4 MG/3ML SOPN, Inject 1 mg into the skin once a week. (Patient not taking: Reported on 06/15/2024), Disp: 3 mL, Rfl: 0   Semaglutide , 2 MG/DOSE, (OZEMPIC , 2 MG/DOSE,) 8 MG/3ML SOPN, INJECT 2 MG INTO THE SKIN ONCE A WEEK, Disp: 3 mL, Rfl: 2   spironolactone  (ALDACTONE ) 25 MG tablet, Take 1 tablet (25 mg total) by  mouth daily., Disp: 90 tablet, Rfl: 3  Social History   Tobacco Use  Smoking Status Every Day   Current packs/day: 0.25   Average packs/day: 0.3 packs/day for 14.0 years (3.5 ttl pk-yrs)   Types: Cigarettes  Smokeless Tobacco Never    Allergies  Allergen Reactions   Lisinopril  Swelling and Other (See Comments)    Ankles swell   Potassium Chloride  Shortness Of Breath, Swelling and Other (See Comments)    Tolerates IV KCl, reaction only to PO product   Other Other (See Comments)    Spring lettuce = mouth becomes numb   Pneumococcal Vaccines Swelling and Other (See Comments)    Swelling at injection site   Vicodin [Hydrocodone-Acetaminophen ] Itching and Rash   Objective:  There were no vitals filed for this visit. There is no height or weight on file to calculate BMI. Constitutional Well developed. Well nourished.  Vascular Dorsalis pedis pulses palpable bilaterally. Posterior tibial pulses palpable bilaterally. Capillary refill normal to all digits.  No cyanosis or clubbing noted. Pedal hair growth normal.  Neurologic Normal speech. Oriented to person, place, and time. Epicritic sensation to light touch grossly present bilaterally.  Dermatologic Left foot blister formation noted secondary catheter reaction no signs of infection noted no deeper wound noted.  No abnormalities identified  Orthopedic: Normal joint ROM without pain or crepitus bilaterally. No visible deformities. No bony tenderness.   Radiographs: None Assessment:   1. Benign skin lesion   2. Encounter for preoperative examination for general surgical procedure     Plan:  Patient was evaluated and treated and all questions answered.  Left left heel skin-benign skin lesion with a history of Cantharone application - All questions and concerns were discussed with the patient in extensive detail at this time given that patient has failed all conservative care would benefit from surgical intervention.  She  would benefit from left excision of benign skin lesion.  I discussed this with the patient extensive detail I discussed my preoperative postoperative plan with the patient in extensive detail she states understanding to proceed despite the risks -Informed surgical risk consent was reviewed and read aloud to the patient.  I reviewed the films.  I have discussed my findings with the patient in great detail.  I have discussed all risks including but not limited to infection, stiffness, scarring, limp, disability, deformity, damage to blood vessels and nerves, numbness, poor healing, need for braces, arthritis, chronic pain, amputation, death.  All benefits and realistic expectations discussed in great detail.  I have made no promises as to the outcome.  I have provided realistic  expectations.  I have offered the patient a 2nd opinion, which they have declined and assured me they preferred to proceed despite the risks   No follow-ups on file.

## 2024-07-13 ENCOUNTER — Other Ambulatory Visit: Payer: Self-pay | Admitting: Medical

## 2024-07-14 ENCOUNTER — Telehealth: Payer: Self-pay | Admitting: Podiatry

## 2024-07-14 NOTE — Telephone Encounter (Signed)
 Received surgical consent form  Left message for pt to call me back to get surgery scheduled with Dr Tobie

## 2024-07-21 ENCOUNTER — Telehealth: Payer: Self-pay | Admitting: Podiatry

## 2024-07-21 NOTE — Telephone Encounter (Signed)
 DOS- 08/02/2024  EXC BENIGN LESION OVER 4.0CM LT- 11426  [employer] EFFECTIVE DATE- 10/29/2023  DEDUCTIBLE- $257 REMAINING- $0 OOP- $9350 REMAINING- $1472.76 COINSURANCE- 20%  PER [employer] WEBSITE, NO PRIOR AUTH IS REQUIRED FOR CPT CODE 88573. DECISION ID# [id number]

## 2024-07-27 ENCOUNTER — Telehealth: Payer: Self-pay | Admitting: Podiatry

## 2024-07-27 NOTE — Telephone Encounter (Signed)
 Pt called stating she got a call about canceling her surgery and I checked and do not have any messages that we are canceling the surgery. She asked where she is to go and I told her the [facility] specialty surgery center and I had her check the blue bag she got ant all there information is in there. Also asked what time surgery was and I told her the surgery center would call Thursday or Friday to give her the time she needs to arrive.

## 2024-08-02 ENCOUNTER — Telehealth: Payer: Self-pay | Admitting: Lab

## 2024-08-02 ENCOUNTER — Other Ambulatory Visit: Payer: Self-pay | Admitting: Podiatry

## 2024-08-02 DIAGNOSIS — D2372 Other benign neoplasm of skin of left lower limb, including hip: Secondary | ICD-10-CM | POA: Diagnosis not present

## 2024-08-02 DIAGNOSIS — B070 Plantar wart: Secondary | ICD-10-CM | POA: Diagnosis not present

## 2024-08-02 DIAGNOSIS — L989 Disorder of the skin and subcutaneous tissue, unspecified: Secondary | ICD-10-CM | POA: Diagnosis not present

## 2024-08-02 DIAGNOSIS — B078 Other viral warts: Secondary | ICD-10-CM | POA: Diagnosis not present

## 2024-08-02 MED ORDER — IBUPROFEN 800 MG PO TABS: 800.0000 mg | ORAL_TABLET | Freq: Four times a day (QID) | ORAL | 1 refills | Status: AC | PRN

## 2024-08-02 MED ORDER — OXYCODONE-ACETAMINOPHEN 5-325 MG PO TABS: 1.0000 | ORAL_TABLET | ORAL | 0 refills | Status: DC | PRN

## 2024-08-02 NOTE — Telephone Encounter (Signed)
 Patient states had surgery today and need note to be out of work.

## 2024-08-03 ENCOUNTER — Encounter: Payer: Self-pay | Admitting: Podiatry

## 2024-08-03 NOTE — Telephone Encounter (Signed)
 Recd mess about a note needing to be done. It was already done. I adv IS it was already done by someone else.

## 2024-08-11 ENCOUNTER — Encounter: Payer: Self-pay | Admitting: Podiatry

## 2024-08-11 ENCOUNTER — Ambulatory Visit: Admitting: Podiatry

## 2024-08-11 DIAGNOSIS — L989 Disorder of the skin and subcutaneous tissue, unspecified: Secondary | ICD-10-CM

## 2024-08-11 MED ORDER — OXYCODONE-ACETAMINOPHEN 5-325 MG PO TABS: 1.0000 | ORAL_TABLET | ORAL | 0 refills | Status: DC | PRN

## 2024-08-11 NOTE — Progress Notes (Signed)
 Subjective:  Patient ID: Sydney Williams, female    DOB: 1966/01/03,  MRN: [id number]  Chief Complaint  Patient presents with   Routine Post Op    POV # 1 DOS 10/6/25LT HEEL EXCISION BENIGN SKIN LESION    DOS: 08/02/2024 Procedure: Left heel excision of benign skin lesion  58 y.o. female returns for post-op check.  Patient states that she is doing well.  Pain is controlled.  Denies any other acute complaints.  Review of Systems: Negative except as noted in the HPI. Denies N/V/F/Ch.  Past Medical History:  Diagnosis Date   Asthma    2015 hospitaliation for asthma   Diabetes mellitus    Type II   Eczema 05/16/2022   Family history of breast cancer    Fibroids 2010   Genital herpes 11/30/2014   GERD (gastroesophageal reflux disease)    Hyperlipidemia    Hypertension    Migraines    Obesity    Right carpal tunnel syndrome    Smoker     Current Outpatient Medications:    oxyCODONE -acetaminophen  (PERCOCET) 5-325 MG tablet, Take 1 tablet by mouth every 4 (four) hours as needed for severe pain (pain score 7-10)., Disp: 30 tablet, Rfl: 0   acetaminophen  (TYLENOL ) 325 MG tablet, Take 650 mg by mouth every 6 (six) hours as needed (for pain or headaches)., Disp: , Rfl:    acyclovir  (ZOVIRAX ) 200 MG capsule, Take 1 capsule by mouth twice daily, Disp: 180 capsule, Rfl: 0   albuterol  (PROVENTIL ) (2.5 MG/3ML) 0.083% nebulizer solution, Take 3 mLs (2.5 mg total) by nebulization every 6 (six) hours as needed for wheezing or shortness of breath., Disp: 75 mL, Rfl: 2   albuterol  (VENTOLIN  HFA) 108 (90 Base) MCG/ACT inhaler, Inhale 2 puffs into the lungs every 6 (six) hours as needed for wheezing or shortness of breath., Disp: 18 g, Rfl: 1   allopurinol  (ZYLOPRIM ) 100 MG tablet, Take 1 tablet by mouth once daily, Disp: 90 tablet, Rfl: 0   amLODipine  (NORVASC ) 10 MG tablet, TAKE 1 TABLET BY MOUTH ONCE DAILY TO  LOWER  BLOOD  PRESSURE, Disp: 90 tablet, Rfl: 1   ascorbic acid  (VITAMIN C) 500 MG  tablet, Take 500 mg by mouth daily., Disp: , Rfl:    aspirin  (EQ ASPIRIN  ADULT LOW DOSE) 81 MG EC tablet, Take 1 tablet (81 mg total) by mouth daily. Swallow whole., Disp: 90 tablet, Rfl: 3   atorvastatin  (LIPITOR) 40 MG tablet, Take 1 tablet (40 mg total) by mouth daily., Disp: 90 tablet, Rfl: 2   Cholecalciferol  (VITAMIN D -3) 125 MCG (5000 UT) TABS, Take 5,000 Units by mouth daily., Disp: , Rfl:    colchicine  0.6 MG tablet, TAKE 1 TABLET BY MOUTH ONCE DAILY AS  NEEDED  FOR  GOUT  FLARE, Disp: 30 tablet, Rfl: 0   ferrous sulfate  325 (65 FE) MG tablet, Take 325 mg by mouth daily with breakfast., Disp: , Rfl:    Fluticasone -Umeclidin-Vilant 200-62.5-25 MCG/ACT AEPB, Inhale 1 puff into the lungs daily. Rinse mouth after each use., Disp: 60 each, Rfl: 4   ibuprofen  (ADVIL ) 800 MG tablet, Take 1 tablet by mouth twice daily as needed, Disp: 30 tablet, Rfl: 1   ibuprofen  (ADVIL ) 800 MG tablet, Take 1 tablet (800 mg total) by mouth every 6 (six) hours as needed., Disp: 60 tablet, Rfl: 1   Insulin  Pen Needle (B-D ULTRAFINE III SHORT PEN) 31G X 8 MM MISC, Use as needed., Disp: 100 each, Rfl: 2   meloxicam  (  MOBIC ) 15 MG tablet, Take 1 tablet by mouth once daily, Disp: 30 tablet, Rfl: 0   metFORMIN  (GLUCOPHAGE ) 500 MG tablet, Take 500 mg by mouth in the morning and at bedtime., Disp: , Rfl:    metoprolol  tartrate (LOPRESSOR ) 50 MG tablet, Take 1 tablet by mouth twice daily, Disp: 180 tablet, Rfl: 1   omeprazole  (PRILOSEC) 40 MG capsule, TAKE 1 CAPSULE BY MOUTH IN THE MORNING AND AT BEDTIME, Disp: 60 capsule, Rfl: 0   oxyCODONE -acetaminophen  (PERCOCET) 5-325 MG tablet, Take 1 tablet by mouth every 8 (eight) hours as needed for severe pain (pain score 7-10)., Disp: 12 tablet, Rfl: 0   oxyCODONE -acetaminophen  (PERCOCET) 5-325 MG tablet, Take 1 tablet by mouth every 4 (four) hours as needed for severe pain (pain score 7-10)., Disp: 30 tablet, Rfl: 0   Semaglutide , 1 MG/DOSE, (OZEMPIC , 1 MG/DOSE,) 4 MG/3ML SOPN,  Inject 1 mg into the skin once a week. (Patient not taking: Reported on 06/15/2024), Disp: 3 mL, Rfl: 0   Semaglutide , 2 MG/DOSE, (OZEMPIC , 2 MG/DOSE,) 8 MG/3ML SOPN, INJECT 2 MG INTO THE SKIN ONCE A WEEK, Disp: 3 mL, Rfl: 2   spironolactone  (ALDACTONE ) 25 MG tablet, Take 1 tablet (25 mg total) by mouth daily., Disp: 90 tablet, Rfl: 3  Social History   Tobacco Use  Smoking Status Every Day   Current packs/day: 0.25   Average packs/day: 0.3 packs/day for 14.0 years (3.5 ttl pk-yrs)   Types: Cigarettes  Smokeless Tobacco Never    Allergies  Allergen Reactions   Lisinopril  Swelling and Other (See Comments)    Ankles swell   Potassium Chloride  Shortness Of Breath, Swelling and Other (See Comments)    Tolerates IV KCl, reaction only to PO product   Other Other (See Comments)    Spring lettuce = mouth becomes numb   Pneumococcal Vaccines Swelling and Other (See Comments)    Swelling at injection site   Vicodin [Hydrocodone-Acetaminophen ] Itching and Rash   Objective:  There were no vitals filed for this visit. There is no height or weight on file to calculate BMI. Constitutional Well developed. Well nourished.  Vascular Foot warm and well perfused. Capillary refill normal to all digits.   Neurologic Normal speech. Oriented to person, place, and time. Epicritic sensation to light touch grossly present bilaterally.  Dermatologic Skin healing well without signs of infection. Skin edges well coapted without signs of infection.  Orthopedic: Tenderness to palpation noted about the surgical site.   Radiographs: None Assessment:   1. Benign skin lesion    Plan:  Patient was evaluated and treated and all questions answered.  S/p foot surgery left -Progressing as expected post-operatively. -XR: See above -WB Status: Forefoot weightbearing for transfers only.  Ideally nonweightbearing to the left foot -Sutures: Intact.  No clinical signs of dehiscence no complication  noted. -Medications: None -Foot redressed.  No follow-ups on file.

## 2024-08-14 ENCOUNTER — Other Ambulatory Visit: Payer: Self-pay | Admitting: Medical

## 2024-08-21 ENCOUNTER — Other Ambulatory Visit: Payer: Self-pay | Admitting: Medical

## 2024-08-23 NOTE — Progress Notes (Signed)
 BASIL BLAKESLEY                                          MRN: [id number]   08/23/2024   The [facility] Quality Team Specialist reviewed this patient medical record for the purposes of chart review for care gap closure. The following were reviewed: abstraction for care gap closure-glycemic status assessment.    [facility] Quality Team

## 2024-08-25 ENCOUNTER — Ambulatory Visit (INDEPENDENT_AMBULATORY_CARE_PROVIDER_SITE_OTHER): Admitting: Podiatry

## 2024-08-25 DIAGNOSIS — L989 Disorder of the skin and subcutaneous tissue, unspecified: Secondary | ICD-10-CM

## 2024-08-25 NOTE — Progress Notes (Signed)
 Patient presents for post-op visit today, POV # 2 DOS 10/6/25LT HEEL EXCISION BENIGN SKIN LESION  I keep getting sharp pains, other than that I am fine. Has not bleeding, but has some swelling. Other than that has been doing good..  RN Notes: n/a  Vital Signs: Today's Vitals   08/25/24 1422  PainSc: 4   PainLoc: Foot      Radiographs: []  Taken [x]  Not taken  Surgical Site Assessment:  - Dressing:  [x]  Minimal dry blood, intact []  Reinforced   []  Changed     -RN Notes: n/a  - Incision:  [x]  CDI (clean, dry, intact)  [x]  Mild erythema  []  Drainage noted   -RN Notes: n/a  - Swelling:  []  None  [x]  Mild  []  Moderate   []  Significant     -RN Notes: n/a  - Bruising:  []  None  [x]  Present: medial aspect along surgical site.    - Sutures/Staples:  []  None [x]  Intact  [x]  Removed Today  []  Plan to remove at next visit     RN placed steri-strips post suture removal.   -Cast/Splint/Pins: []  None []  Intact []  Removed Today []  Plan to remove at next visit []  Replaced  -Signs of infection:  [x]  None  []  Present - Describe: na  -DME:    []  None []  AFW [x]  Surgical shoe []  Cast  []  Splint  -Walking status:  [x]  Full WB  []  Partial WB  []  NWB  -Utilizing device:  [x]  None []  Knee Scooter []  Crutches []  Wheelchair    DVT assessment:  [x]  Denies symptoms []  Chest pain/SOB []  Pain in calf/redness/warmth   Redressed DSD and ace wrap. Educated on signs of infection, proper dressing care, pain management, and weight bearing status. Patient will contact provider with any new or worsening symptoms. The provider assessed the patient today and reviewed instructions regarding plan of care.

## 2024-08-31 ENCOUNTER — Other Ambulatory Visit: Payer: Self-pay | Admitting: Medical

## 2024-09-02 ENCOUNTER — Ambulatory Visit (INDEPENDENT_AMBULATORY_CARE_PROVIDER_SITE_OTHER): Admitting: Medical

## 2024-09-02 VITALS — BP 120/80 | HR 79 | Wt 254.6 lb

## 2024-09-02 DIAGNOSIS — E782 Mixed hyperlipidemia: Secondary | ICD-10-CM

## 2024-09-02 DIAGNOSIS — I10 Essential (primary) hypertension: Secondary | ICD-10-CM

## 2024-09-02 DIAGNOSIS — Z23 Encounter for immunization: Secondary | ICD-10-CM | POA: Diagnosis not present

## 2024-09-02 DIAGNOSIS — R5383 Other fatigue: Secondary | ICD-10-CM

## 2024-09-02 DIAGNOSIS — E119 Type 2 diabetes mellitus without complications: Secondary | ICD-10-CM

## 2024-09-02 DIAGNOSIS — R350 Frequency of micturition: Secondary | ICD-10-CM | POA: Diagnosis not present

## 2024-09-02 DIAGNOSIS — R682 Dry mouth, unspecified: Secondary | ICD-10-CM

## 2024-09-02 LAB — POCT GLYCOSYLATED HEMOGLOBIN (HGB A1C): Hemoglobin A1C: 5.7 % — AB (ref 4.0–5.6)

## 2024-09-02 LAB — POCT URINALYSIS DIP (PROADVANTAGE DEVICE)
Bilirubin, UA: NEGATIVE
Blood, UA: NEGATIVE
Glucose, UA: NEGATIVE mg/dL
Ketones, POC UA: NEGATIVE mg/dL
Leukocytes, UA: NEGATIVE
Nitrite, UA: NEGATIVE
Protein Ur, POC: NEGATIVE mg/dL
Specific Gravity, Urine: 1.005
Urobilinogen, Ur: NEGATIVE
pH, UA: 6 (ref 5.0–8.0)

## 2024-09-02 NOTE — Progress Notes (Unsigned)
 Subjective: Chief Complaint  Patient presents with   Acute Visit    Check feet, A1c level- tired, urinating a lot, mouth dry, flu shot   Sydney Williams is a 58 year old female who presents with fatigue, dry mouth, and increased urination.  She has been experiencing significant fatigue, dry mouth, and increased urination over the past two to three weeks. No burning sensation or blood in her urine. She is currently taking spironolactone , which is a diuretic, but notes that her increased urination has been persistent.  She underwent foot surgery recently and continues to experience pain under her feet, especially when walking.  Her current medications include Ozempic  2 mg weekly, metformin  500 mg twice a day, allopurinol  100 mg daily, Lipitor 40 mg daily, iron once a day, metoprolol  50 mg twice a day, spironolactone  25 mg daily, aspirin , and amlodipine  10 mg for blood pressure.  She mentions a dietary habit of consuming salty foods, particularly chips, which might contribute to her dry mouth.  She has a history of a heart ultrasound in July and has not seen a cardiologist since then despite a referral being made in August. She also had a chest x-ray in August following an emergency department visit.  No other aggravating or relieving factors. No other complaint.  Past Medical History:  Diagnosis Date   Asthma    2015 hospitaliation for asthma   Diabetes mellitus    Type II   Eczema 05/16/2022   Family history of breast cancer    Fibroids 2010   Genital herpes 11/30/2014   GERD (gastroesophageal reflux disease)    Hyperlipidemia    Hypertension    Migraines    Obesity    Right carpal tunnel syndrome    Smoker    Current Outpatient Medications on File Prior to Visit  Medication Sig Dispense Refill   acyclovir  (ZOVIRAX ) 200 MG capsule Take 1 capsule by mouth twice daily 180 capsule 0   albuterol  (PROVENTIL ) (2.5 MG/3ML) 0.083% nebulizer solution Take 3 mLs (2.5 mg total) by  nebulization every 6 (six) hours as needed for wheezing or shortness of breath. 75 mL 2   albuterol  (VENTOLIN  HFA) 108 (90 Base) MCG/ACT inhaler Inhale 2 puffs into the lungs every 6 (six) hours as needed for wheezing or shortness of breath. 18 g 1   allopurinol  (ZYLOPRIM ) 100 MG tablet Take 1 tablet by mouth once daily 90 tablet 0   amLODipine  (NORVASC ) 10 MG tablet TAKE 1 TABLET BY MOUTH ONCE DAILY TO  LOWER  BLOOD  PRESSURE 90 tablet 1   ascorbic acid  (VITAMIN C) 500 MG tablet Take 500 mg by mouth daily.     aspirin  (EQ ASPIRIN  ADULT LOW DOSE) 81 MG EC tablet Take 1 tablet (81 mg total) by mouth daily. Swallow whole. 90 tablet 3   atorvastatin  (LIPITOR) 40 MG tablet Take 1 tablet by mouth once daily 90 tablet 0   Cholecalciferol  (VITAMIN D -3) 125 MCG (5000 UT) TABS Take 5,000 Units by mouth daily.     colchicine  0.6 MG tablet TAKE 1 TABLET BY MOUTH ONCE DAILY AS  NEEDED  FOR  GOUT  FLARE 30 tablet 0   ferrous sulfate  325 (65 FE) MG tablet Take 325 mg by mouth daily with breakfast.     Fluticasone -Umeclidin-Vilant 200-62.5-25 MCG/ACT AEPB Inhale 1 puff into the lungs daily. Rinse mouth after each use. 60 each 4   ibuprofen  (ADVIL ) 800 MG tablet Take 1 tablet by mouth twice daily as needed 30 tablet  1   ibuprofen  (ADVIL ) 800 MG tablet Take 1 tablet (800 mg total) by mouth every 6 (six) hours as needed. 60 tablet 1   meloxicam  (MOBIC ) 15 MG tablet Take 1 tablet by mouth once daily 30 tablet 0   metFORMIN  (GLUCOPHAGE ) 500 MG tablet Take 500 mg by mouth in the morning and at bedtime.     metoprolol  tartrate (LOPRESSOR ) 50 MG tablet Take 1 tablet by mouth twice daily 180 tablet 1   omeprazole  (PRILOSEC) 40 MG capsule TAKE 1 CAPSULE BY MOUTH IN THE MORNING AND AT BEDTIME 60 capsule 0   Semaglutide , 2 MG/DOSE, (OZEMPIC , 2 MG/DOSE,) 8 MG/3ML SOPN INJECT 2 MG INTO THE SKIN ONCE A WEEK 3 mL 2   spironolactone  (ALDACTONE ) 25 MG tablet Take 1 tablet (25 mg total) by mouth daily. 90 tablet 3   acetaminophen   (TYLENOL ) 325 MG tablet Take 650 mg by mouth every 6 (six) hours as needed (for pain or headaches).     Insulin  Pen Needle (B-D ULTRAFINE III SHORT PEN) 31G X 8 MM MISC Use as needed. 100 each 2   No current facility-administered medications on file prior to visit.    ROS as in subjective   Objective: BP 120/80   Pulse 79   Wt 254 lb 9.6 oz (115.5 kg)   LMP 06/29/2015 (Exact Date)   SpO2 98%   BMI 35.51 kg/m   General appearence: alert, no distress, WD/WN,  HEENT: normocephalic, sclerae anicteric Oral cavity: MMM, no lesions Neck: supple, no lymphadenopathy, no thyromegaly, no masses, no JVD Heart: RRR, normal S1, S2, no murmurs Lungs: CTA bilaterally, no wheezes, rhonchi, or rales Abdomen: +bs, soft, non tender, non distended, no masses, no hepatomegaly, no splenomegaly Pulses: 2+ symmetric, upper and lower extremities, normal cap refill Left volar foot medial side of midfoot and anterior heel with surgical wound approximately 6 cm long with some serous crust and mild tenderness along the medial foot but no erythema or fluctuance drainage or warmth   Assessment: Encounter Diagnoses  Name Primary?   Fatigue, unspecified type Yes   Needs flu shot    Dry mouth    Urinary frequency    Essential hypertension    Mixed hyperlipidemia    Type 2 diabetes mellitus without complication, without long-term current use of insulin  (HCC)      Plan: Fatigue - discussed wide differential -labs as below -consider sleep study  Encounter for immunization Counseled on the influenza virus vaccine.   Influenza vaccine given after consent obtained.  Urinary frequency, dry mouth -urinalysis normal -hgba1c 5.7% -likely likely due to to recent excess in salt and sugar from halloween candy and chips  Type 2 diabetes mellitus Managed with Ozempic  and metformin . No acute issues despite recent dietary indiscretion. -hgba1C 5.7%  Foot wound, post-surgical, healing -Tenderness without  infection signs. Pain likely from weight-bearing.  Hypertension -continue Amlodipine  10mg  daily  -continue Metoprolol  50mg  BID  Hyperlipidemia -continue Lipitor 40mg  daily and aspirin  81mg  daily   Alissia was seen today for acute visit.  Diagnoses and all orders for this visit:  Fatigue, unspecified type -     TSH -     Basic metabolic panel with GFR -     Vitamin B12  Needs flu shot -     Flu vaccine trivalent PF, 6mos and older(Flulaval,Afluria,Fluarix,Fluzone)  Dry mouth -     POCT glycosylated hemoglobin (Hb A1C)  Urinary frequency  Essential hypertension  Mixed hyperlipidemia  Type 2 diabetes mellitus without complication,  without long-term current use of insulin  (HCC)    F/u pending labs

## 2024-09-02 NOTE — Progress Notes (Signed)
 Can we check the status of the cardiology referral from August. Pt has not heard from anyone

## 2024-09-03 ENCOUNTER — Ambulatory Visit: Payer: Self-pay | Admitting: Medical

## 2024-09-03 ENCOUNTER — Other Ambulatory Visit: Payer: Self-pay | Admitting: Medical

## 2024-09-03 LAB — TSH: TSH: 1.08 u[IU]/mL (ref 0.450–4.500)

## 2024-09-03 LAB — BASIC METABOLIC PANEL WITH GFR
BUN/Creatinine Ratio: 13 (ref 9–23)
BUN: 8 mg/dL (ref 6–24)
CO2: 24 mmol/L (ref 20–29)
Calcium: 10 mg/dL (ref 8.7–10.2)
Chloride: 101 mmol/L (ref 96–106)
Creatinine, Ser: 0.61 mg/dL (ref 0.57–1.00)
Glucose: 79 mg/dL (ref 70–99)
Potassium: 4 mmol/L (ref 3.5–5.2)
Sodium: 140 mmol/L (ref 134–144)
eGFR: 103 mL/min/1.73 (ref >59)

## 2024-09-03 LAB — VITAMIN B12: Vitamin B-12: 1451 pg/mL — ABNORMAL HIGH (ref 232–1245)

## 2024-09-03 NOTE — Progress Notes (Signed)
 Diabetes marker stable at 5.7%, B12 is actually above the normal range so if you are taking B12 supplement I would lower this.  Thyroid okay, electrolytes okay  Continue current medications.  Limit sweets, candy, junk food,  and salt

## 2024-10-03 ENCOUNTER — Other Ambulatory Visit: Payer: Self-pay | Admitting: Medical

## 2024-10-03 DIAGNOSIS — M109 Gout, unspecified: Secondary | ICD-10-CM

## 2024-10-14 ENCOUNTER — Ambulatory Visit (INDEPENDENT_AMBULATORY_CARE_PROVIDER_SITE_OTHER)

## 2024-10-14 ENCOUNTER — Other Ambulatory Visit: Payer: Self-pay | Admitting: Medical

## 2024-10-14 DIAGNOSIS — Z0000 Encounter for general adult medical examination without abnormal findings: Secondary | ICD-10-CM | POA: Diagnosis not present

## 2024-10-14 NOTE — Telephone Encounter (Unsigned)
 Copied from CRM #[id number]. Topic: Clinical - Medication Refill >> Oct 14, 2024 12:28 PM Lauren C wrote: Medication:  ibuprofen  (ADVIL ) 800 MG tablet acetaminophen  (TYLENOL ) 325 MG tablet (last sent by historical provider)   Has the patient contacted their pharmacy? No (Agent: If no, request that the patient contact the pharmacy for the refill. If patient does not wish to contact the pharmacy document the reason why and proceed with request.) (Agent: If yes, when and what did the pharmacy advise?)  This is the patient's preferred pharmacy:  [facility] [address] [place], [place] - [address] [place] [address] Phone: [phone] Fax: [phone]  Is this the correct pharmacy for this prescription? Yes If no, delete pharmacy and type the correct one.   Has the prescription been filled recently? Ibuprofen  yes  Is the patient out of the medication? Yes, out of both  Has the patient been seen for an appointment in the last year OR does the patient have an upcoming appointment? Yes  Can we respond through MyChart? Yes, does not have mychart access but says that it sends her a text once complete  Agent: Please be advised that Rx refills may take up to 3 business days. We ask that you follow-up with your pharmacy.

## 2024-10-14 NOTE — Progress Notes (Unsigned)
 Chief Complaint  Patient presents with   Medicare Wellness     Subjective:   Sydney Williams is a 58 y.o. female who presents for a Medicare Annual Wellness Visit.  Visit info / Clinical Intake: Medicare Wellness Visit Type:: Initial Annual Wellness Visit Persons participating in visit and providing information:: patient Medicare Wellness Visit Mode:: Telephone If telephone:: video declined Since this visit was completed virtually, some vitals may be partially provided or unavailable. Missing vitals are due to the limitations of the virtual format.: Unable to obtain vitals - no equipment If Telephone or Video please confirm:: I connected with patient using audio/video enable telemedicine. I verified patient identity with two identifiers, discussed telehealth limitations, and patient agreed to proceed. Patient Location:: [place] Provider Location:: Home Interpreter Needed?: No Pre-visit prep was completed: yes AWV questionnaire completed by patient prior to visit?: no Living arrangements:: lives with spouse/significant other Patient's Overall Health Status Rating: excellent Typical amount of pain: none Does pain affect daily life?: no Are you currently prescribed opioids?: no  Dietary Habits and Nutritional Risks How many meals a day?: 2 Eats fruit and vegetables daily?: yes Most meals are obtained by: preparing own meals In the last 2 weeks, have you had any of the following?: none Diabetic:: (!) yes Any non-healing wounds?: no How often do you check your BS?: 1 Would you like to be referred to a Nutritionist or for Diabetic Management? : no  Functional Status Activities of Daily Living (to include ambulation/medication): Independent Ambulation: Independent Medication Administration: Independent Home Management (perform basic housework or laundry): Independent Manage your own finances?: yes Primary transportation is: driving Concerns about vision?: no *vision screening is  required for WTM* Concerns about hearing?: no  Fall Screening Falls in the past year?: 0 Number of falls in past year: 0 Was there an injury with Fall?: 0 Fall Risk Category Calculator: 0 Patient Fall Risk Level: Low Fall Risk  Fall Risk Patient at Risk for Falls Due to: No Fall Risks Fall risk Follow up: Falls evaluation completed; Education provided; Falls prevention discussed  Home and Transportation Safety: All rugs have non-skid backing?: N/A, no rugs All stairs or steps have railings?: N/A, no stairs Grab bars in the bathtub or shower?: (!) no Have non-skid surface in bathtub or shower?: yes Good home lighting?: yes Regular seat belt use?: yes Hospital stays in the last year:: no  Cognitive Assessment Difficulty concentrating, remembering, or making decisions? : no Will 6CIT or Mini Cog be Completed: yes What year is it?: 0 points What month is it?: 0 points Give patient an address phrase to remember (5 components): The apple fell on the ground About what time is it?: 0 points Count backwards from 20 to 1: 0 points Say the months of the year in reverse: 0 points Repeat the address phrase from earlier: 0 points 6 CIT Score: 0 points  Advance Directives (For Healthcare) Does Patient Have a Medical Advance Directive?: No Would patient like information on creating a medical advance directive?: No - Patient declined  Reviewed/Updated  Reviewed/Updated: Reviewed All (Medical, Surgical, Family, Medications, Allergies, Care Teams, Patient Goals)    Allergies (verified) Lisinopril , Potassium chloride , Other, Pneumococcal vaccines, and Vicodin [hydrocodone-acetaminophen ]   Current Medications (verified) Outpatient Encounter Medications as of 10/14/2024  Medication Sig   acetaminophen  (TYLENOL ) 325 MG tablet Take 650 mg by mouth every 6 (six) hours as needed (for pain or headaches).   acyclovir  (ZOVIRAX ) 200 MG capsule Take 1 capsule by mouth twice daily  albuterol   (PROVENTIL ) (2.5 MG/3ML) 0.083% nebulizer solution Take 3 mLs (2.5 mg total) by nebulization every 6 (six) hours as needed for wheezing or shortness of breath.   albuterol  (VENTOLIN  HFA) 108 (90 Base) MCG/ACT inhaler Inhale 2 puffs into the lungs every 6 (six) hours as needed for wheezing or shortness of breath.   allopurinol  (ZYLOPRIM ) 100 MG tablet Take 1 tablet by mouth once daily   amLODipine  (NORVASC ) 10 MG tablet TAKE 1 TABLET BY MOUTH ONCE DAILY TO  LOWER  BLOOD  PRESSURE   ascorbic acid  (VITAMIN C) 500 MG tablet Take 500 mg by mouth daily.   aspirin  (EQ ASPIRIN  ADULT LOW DOSE) 81 MG EC tablet Take 1 tablet (81 mg total) by mouth daily. Swallow whole.   atorvastatin  (LIPITOR) 40 MG tablet Take 1 tablet by mouth once daily   Cholecalciferol  (VITAMIN D -3) 125 MCG (5000 UT) TABS Take 5,000 Units by mouth daily.   colchicine  0.6 MG tablet TAKE 1 TABLET BY MOUTH ONCE DAILY AS NEEDED FOR  GOUT  FLARE   ferrous sulfate  325 (65 FE) MG tablet Take 325 mg by mouth daily with breakfast.   Fluticasone -Umeclidin-Vilant 200-62.5-25 MCG/ACT AEPB Inhale 1 puff into the lungs daily. Rinse mouth after each use.   ibuprofen  (ADVIL ) 800 MG tablet Take 1 tablet by mouth twice daily as needed   ibuprofen  (ADVIL ) 800 MG tablet Take 1 tablet (800 mg total) by mouth every 6 (six) hours as needed.   Insulin  Pen Needle (B-D ULTRAFINE III SHORT PEN) 31G X 8 MM MISC Use as needed.   meloxicam  (MOBIC ) 15 MG tablet Take 1 tablet by mouth once daily   metFORMIN  (GLUCOPHAGE ) 500 MG tablet Take 500 mg by mouth in the morning and at bedtime.   metoprolol  tartrate (LOPRESSOR ) 50 MG tablet Take 1 tablet by mouth twice daily   omeprazole  (PRILOSEC) 40 MG capsule TAKE 1 CAPSULE BY MOUTH IN THE MORNING AND AT BEDTIME   Semaglutide , 2 MG/DOSE, (OZEMPIC , 2 MG/DOSE,) 8 MG/3ML SOPN INJECT 2 MG INTO THE SKIN ONCE A WEEK   spironolactone  (ALDACTONE ) 25 MG tablet Take 1 tablet (25 mg total) by mouth daily.   No facility-administered  encounter medications on file as of 10/14/2024.    History: Past Medical History:  Diagnosis Date   Asthma    2015 hospitaliation for asthma   Diabetes mellitus    Type II   Eczema 05/16/2022   Family history of breast cancer    Fibroids 2010   Genital herpes 11/30/2014   GERD (gastroesophageal reflux disease)    Hyperlipidemia    Hypertension    Migraines    Obesity    Right carpal tunnel syndrome    Smoker    Past Surgical History:  Procedure Laterality Date   BACK SURGERY  10/24/2020   CARDIAC CATHETERIZATION     CARPAL TUNNEL RELEASE Right 08/30/2020   Procedure: RIGHT CARPAL TUNNEL RELEASE;  Surgeon: Jerri Kay HERO, MD;  Location: [facility];  Service: Orthopedics;  Laterality: Right;   COLONOSCOPY  03/13/2016   Dr. Avram, normal, repeat 2027   CYST EXCISION     left neck/postauricular region, benign   DILATION AND CURETTAGE OF UTERUS     LAPAROSCOPIC ABDOMINAL EXPLORATION     removal of ectopic preg   LEFT HEART CATH AND CORONARY ANGIOGRAPHY N/A 06/23/2017   Procedure: LEFT HEART CATH AND CORONARY ANGIOGRAPHY;  Surgeon: Mady Bruckner, MD;  Location: [facility];  Service: Cardiovascular;  Laterality:  N/A;   TUBAL LIGATION     Family History  Problem Relation Age of Onset   Diabetes Mother    Hypertension Mother    Aneurysm Mother    Stroke Mother    Cancer Mother        cervical cancer   Breast cancer Maternal Aunt    Cancer Maternal Aunt        breast   Cancer Cousin        breast/breast   Lupus Cousin    Heart disease Neg Hx    Colon cancer Neg Hx    Allergic rhinitis Neg Hx    Angioedema Neg Hx    Asthma Neg Hx    Atopy Neg Hx    Eczema Neg Hx    Immunodeficiency Neg Hx    Urticaria Neg Hx    Social History   Occupational History   Not on file  Tobacco Use   Smoking status: Every Day    Current packs/day: 0.25    Average packs/day: 0.3 packs/day for 14.0 years (3.5 ttl pk-yrs)    Types: Cigarettes   Smokeless  tobacco: Never  Vaping Use   Vaping status: Never Used  Substance and Sexual Activity   Alcohol use: Yes    Comment: occ   Drug use: No   Sexual activity: Yes    Birth control/protection: Surgical   Tobacco Counseling Ready to quit: Not Answered Counseling given: Not Answered  SDOH Screenings   Food Insecurity: No Food Insecurity (10/14/2024)  Housing: Low Risk (10/14/2024)  Transportation Needs: No Transportation Needs (10/14/2024)  Utilities: Not At Risk (10/14/2024)  Depression (PHQ2-9): Low Risk (10/14/2024)  Financial Resource Strain: Low Risk (05/14/2024)  Physical Activity: Insufficiently Active (10/14/2024)  Social Connections: Socially Integrated (10/14/2024)  Stress: No Stress Concern Present (10/14/2024)  Tobacco Use: High Risk (06/16/2024)  Health Literacy: Adequate Health Literacy (10/14/2024)   See flowsheets for full screening details  Depression Screen PHQ 2 & 9 Depression Scale- Over the past 2 weeks, how often have you been bothered by any of the following problems? Little interest or pleasure in doing things: 0 Feeling down, depressed, or hopeless (PHQ Adolescent also includes...irritable): 0 PHQ-2 Total Score: 0     Goals Addressed   None          Objective:    There were no vitals filed for this visit. There is no height or weight on file to calculate BMI.  Hearing/Vision screen No results found. Immunizations and Health Maintenance Health Maintenance  Topic Date Due   Medicare Annual Wellness (AWV)  Never done   Zoster Vaccines- Shingrix (1 of 2) Never done   Diabetic kidney evaluation - Urine ACR  12/26/2023   COVID-19 Vaccine (5 - 2025-26 season) 06/28/2024   OPHTHALMOLOGY EXAM  07/08/2024   Hepatitis B Vaccines 19-59 Average Risk (1 of 3 - 19+ 3-dose series) 05/14/2025 (Originally 04/03/1985)   Pneumococcal Vaccine: 50+ Years (1 of 2 - PCV) 10/14/2025 (Originally 04/03/1985)   HEMOGLOBIN A1C  03/02/2025   FOOT EXAM  05/14/2025    Diabetic kidney evaluation - eGFR measurement  09/02/2025   Colonoscopy  03/13/2026   Mammogram  05/25/2026   Cervical Cancer Screening (HPV/Pap Cotest)  04/29/2028   DTaP/Tdap/Td (3 - Td or Tdap) 06/20/2033   Influenza Vaccine  Completed   Hepatitis C Screening  Completed   HIV Screening  Completed   HPV VACCINES  Aged Out   Meningococcal B Vaccine  Aged Out  Assessment/Plan:  This is a routine wellness examination for Sydney Williams.  Patient Care Team: Tysinger, Alm RAMAN, PA-C as PCP - General (Family Medicine) [facility]  I have personally reviewed and noted the following in the patients chart:   Medical and social history Use of alcohol, tobacco or illicit drugs  Current medications and supplements including opioid prescriptions. Functional ability and status Nutritional status Physical activity Advanced directives List of other physicians Hospitalizations, surgeries, and ER visits in previous 12 months Vitals Screenings to include cognitive, depression, and falls Referrals and appointments  No orders of the defined types were placed in this encounter.  In addition, I have reviewed and discussed with patient certain preventive protocols, quality metrics, and best practice recommendations. A written personalized care plan for preventive services as well as general preventive health recommendations were provided to patient.   Sydney Williams, CMA   10/14/2024   No follow-ups on file.  After Visit Summary: (Declined) Due to this being a telephonic visit, with patients personalized plan was offered to patient but patient Declined AVS at this time   Nurse Notes: Patient is doing well. She has gotten all her refills from the pharmacy today. She will get the covid and shingles vaccines at the pharmacy. She cannot get pneumonia vaccine as she is allergic. She has seen the eye doctor and she sees Dr Ruthell.

## 2024-10-14 NOTE — Patient Instructions (Signed)
 Ms. Coltrin,  Thank you for taking the time for your Medicare Wellness Visit. I appreciate your continued commitment to your health goals. Please review the care plan we discussed, and feel free to reach out if I can assist you further.  Please note that Annual Wellness Visits do not include a physical exam. Some assessments may be limited, especially if the visit was conducted virtually. If needed, we may recommend an in-person follow-up with your provider.  Ongoing Care Seeing your primary care provider every 3 to 6 months helps us  monitor your health and provide consistent, personalized care.   Referrals If a referral was made during today's visit and you haven't received any updates within two weeks, please contact the referred provider directly to check on the status.  Recommended Screenings:  Health Maintenance  Topic Date Due   Medicare Annual Wellness Visit  Never done   Pneumococcal Vaccine for age over 24 (1 of 2 - PCV) Never done   Zoster (Shingles) Vaccine (1 of 2) Never done   Yearly kidney health urinalysis for diabetes  12/26/2023   COVID-19 Vaccine (5 - 2025-26 season) 06/28/2024   Eye exam for diabetics  07/08/2024   Hepatitis B Vaccine (1 of 3 - 19+ 3-dose series) 05/14/2025*   Hemoglobin A1C  03/02/2025   Complete foot exam   05/14/2025   Yearly kidney function blood test for diabetes  09/02/2025   Colon Cancer Screening  03/13/2026   Breast Cancer Screening  05/25/2026   Pap with HPV screening  04/29/2028   DTaP/Tdap/Td vaccine (3 - Td or Tdap) 06/20/2033   Flu Shot  Completed   Hepatitis C Screening  Completed   HIV Screening  Completed   HPV Vaccine  Aged Out   Meningitis B Vaccine  Aged Out  *Topic was postponed. The date shown is not the original due date.       10/14/2024    3:52 PM  Advanced Directives  Does Patient Have a Medical Advance Directive? No  Would patient like information on creating a medical advance directive? No - Patient declined     Vision: Annual vision screenings are recommended for early detection of glaucoma, cataracts, and diabetic retinopathy. These exams can also reveal signs of chronic conditions such as diabetes and high blood pressure.  Dental: Annual dental screenings help detect early signs of oral cancer, gum disease, and other conditions linked to overall health, including heart disease and diabetes.  Please see the attached documents for additional preventive care recommendations.

## 2024-10-15 MED ORDER — ACETAMINOPHEN 325 MG PO TABS: 650.0000 mg | ORAL_TABLET | Freq: Two times a day (BID) | ORAL | 0 refills | Status: AC | PRN

## 2024-10-15 MED ORDER — IBUPROFEN 800 MG PO TABS: 800.0000 mg | ORAL_TABLET | Freq: Two times a day (BID) | ORAL | 1 refills | Status: AC | PRN

## 2024-11-04 ENCOUNTER — Other Ambulatory Visit (HOSPITAL_COMMUNITY): Payer: Self-pay

## 2024-11-04 ENCOUNTER — Telehealth: Payer: Self-pay | Admitting: Pharmacy Technician

## 2024-11-04 NOTE — Telephone Encounter (Signed)
 Pharmacy Patient Advocate Encounter   Received notification from [employer] CMM KEY that prior authorization for Ozempic  (2 MG/DOSE) 8MG /3ML pen-injectors is required/requested.   Insurance verification completed.   The patient is insured through [employer].   Per test claim: PA required; PA started via CoverMyMeds. KEY [id number] . Waiting for clinical questions to populate.

## 2024-11-04 NOTE — Telephone Encounter (Signed)
 Clinical questions and answers have been submitted.

## 2024-11-04 NOTE — Telephone Encounter (Signed)
 Pharmacy Patient Advocate Encounter  Received notification from [facility]  that Prior Authorization for Ozempic  (2 MG/DOSE) 8MG /3ML pen-injectors has been APPROVED from 11/04/2024 to 11/04/2025. Ran test claim, Copay is $12.65. This test claim was processed through [facility]- copay amounts may vary at other pharmacies due to pharmacy/plan contracts, or as the patient moves through the different stages of their insurance plan.   PA #/Case ID/Reference #: [id number]

## 2024-11-08 ENCOUNTER — Other Ambulatory Visit: Payer: Self-pay

## 2024-11-09 ENCOUNTER — Ambulatory Visit: Admitting: Family Medicine

## 2024-11-09 ENCOUNTER — Encounter: Payer: Self-pay | Admitting: Family Medicine

## 2024-11-09 ENCOUNTER — Ambulatory Visit: Payer: Self-pay

## 2024-11-09 VITALS — BP 110/78 | HR 74 | Temp 98.2°F

## 2024-11-09 DIAGNOSIS — J029 Acute pharyngitis, unspecified: Secondary | ICD-10-CM

## 2024-11-09 DIAGNOSIS — J020 Streptococcal pharyngitis: Secondary | ICD-10-CM | POA: Diagnosis not present

## 2024-11-09 DIAGNOSIS — R051 Acute cough: Secondary | ICD-10-CM

## 2024-11-09 DIAGNOSIS — Z111 Encounter for screening for respiratory tuberculosis: Secondary | ICD-10-CM | POA: Diagnosis not present

## 2024-11-09 DIAGNOSIS — J4540 Moderate persistent asthma, uncomplicated: Secondary | ICD-10-CM | POA: Diagnosis not present

## 2024-11-09 LAB — POC COVID19/FLU A&B COMBO
Covid Antigen, POC: NEGATIVE
Influenza A Antigen, POC: NEGATIVE
Influenza B Antigen, POC: NEGATIVE

## 2024-11-09 LAB — POCT RAPID STREP A (OFFICE): Rapid Strep A Screen: POSITIVE — AB

## 2024-11-09 MED ORDER — AMOXICILLIN 875 MG PO TABS: 875.0000 mg | ORAL_TABLET | Freq: Two times a day (BID) | ORAL | 0 refills | Status: AC

## 2024-11-09 NOTE — Progress Notes (Signed)
" ° °  Subjective:    Patient ID: Sydney Williams, female    DOB: [birth year], 59 y.o.   MRN: [id number]  Discussed the use of AI scribe software for clinical note transcription with the patient, who gave verbal consent to proceed.  History of Present Illness   Sydney Williams is a 59 year old female with asthma who presents with headache, fatigue, cough, and wheezing.  Symptoms began on Saturday with fatigue and headache, followed by the development of a cough and wheezing. She has asthma and is using her medications, including Trelegy once daily and albuterol  as needed. This is the first time she has experienced wheezing with these symptoms.  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   She does need another TB test done for work.       Review of Systems     Objective:    Physical Exam Alert and in no distress. Tympanic membranes and canals are normal. Pharyngeal area is normal. Neck is supple without adenopathy or thyromegaly. Cardiac exam shows a regular sinus rhythm without murmurs or gallops. Lungs show slight expiratory wheezing Flu and COVID was negative however strep was positive.. Voice is noted to be hoarse      Assessment & Plan:  Assessment and Plan    Streptococcal pharyngitis Positive strep test with atypical symptoms including cough. - Prescribed amoxicillin . - Advised to start antibiotic for one day before returning to work.  Asthma with acute exacerbation Acute exacerbation with wheezing. - Continue Trelegy and albuterol  inhalers. Screening for TB with [facility] General Health Maintenance  Note given to return to work tomorrow.  "

## 2024-11-09 NOTE — Telephone Encounter (Signed)
 FYI Only or Action Required?: FYI only for provider: appointment scheduled on 1/13.  Patient was last seen in primary care on 09/02/2024 by Bulah Alm RAMAN, PA-C.  Called Nurse Triage reporting Wheezing.  Symptoms began several days ago.  Interventions attempted: Prescription medications: albuterol .  Symptoms are: gradually worsening.  Triage Disposition: See Physician Within 24 Hours  Patient/caregiver understands and will follow disposition?: Yes  Copied from CRM #[id number]. Topic: Clinical - Red Word Triage >> Nov 09, 2024  8:02 AM Treva T wrote: Red Word that prompted transfer to Nurse Triage: Pt is calling states asthma flare up with shortness of breath, difficulty breathing, shortness of breath, wheezing, runny nose clear mucous, and losing voice.  Pt requesting an appt for evaluation. . Reason for Disposition  [1] MILD asthma attack (e.g., no SOB at rest, mild SOB with walking, speaks normally in sentences, mild wheezing) AND [2] lasting > 24 hours on prescribed treatment  Answer Assessment - Initial Assessment Questions 1. RESPIRATORY STATUS: Describe your breathing? (e.g., wheezing, shortness of breath, unable to speak, severe coughing)      Sob wheezing 2. ONSET: When did this asthma attack begin?      Few day 3. TRIGGER: What do you think triggered this attack? (e.g., URI, exposure to pollen or other allergen, tobacco smoke)       4. PEAK EXPIRATORY FLOW RATE (PEFR): Do you use a peak flow meter? If Yes, ask: What's the current peak flow? What's your personal best peak flow?       5. SEVERITY: How bad is this attack?       6. ASTHMA MEDICINES:  What treatments have you tried?      inhalers 7. INHALED QUICK-RELIEF TREATMENTS FOR THIS ATTACK: What treatments have you given yourself so far? and How many and how often? If using an inhaler, ask, How many puffs? Note: Routine treatments are 2 puffs every 4 hours as needed. Rescue treatments are 4 puffs  repeated every 20 minutes, up to three times as needed.      Albuterol  at 7am 8. OTHER SYMPTOMS: Do you have any other symptoms? (e.g., chest pain, coughing up yellow sputum, fever, runny nose)     no 9. O2 SATURATION MONITOR:  Do you use an oxygen saturation monitor (pulse oximeter) at home? If Yes, What is your reading (oxygen level) today? What is your usual oxygen saturation reading? (e.g., 95%)      10. PREGNANCY: Is there any chance you are pregnant? When was your last menstrual period?  Protocols used: Asthma Attack-A-AH

## 2024-11-10 ENCOUNTER — Other Ambulatory Visit (HOSPITAL_COMMUNITY): Payer: Self-pay

## 2024-11-11 ENCOUNTER — Ambulatory Visit: Payer: Self-pay | Admitting: Family Medicine

## 2024-11-11 LAB — QUANTIFERON-TB GOLD PLUS
QuantiFERON Mitogen Value: 5.34 [IU]/mL
QuantiFERON Nil Value: 0.02 [IU]/mL
QuantiFERON TB1 Ag Value: 0.02 [IU]/mL
QuantiFERON TB2 Ag Value: 0.01 [IU]/mL
QuantiFERON-TB Gold Plus: NEGATIVE

## 2024-11-12 ENCOUNTER — Other Ambulatory Visit: Payer: Self-pay | Admitting: Medical

## 2024-12-01 ENCOUNTER — Other Ambulatory Visit: Payer: Self-pay | Admitting: Medical

## 2025-10-18 ENCOUNTER — Ambulatory Visit
